# Patient Record
Sex: Female | Born: 1945 | Race: White | Hispanic: No | Smoking: Former smoker
Health system: Southern US, Community
[De-identification: ages and names within clinical notes are randomized; demographics above are authoritative.]

## PROBLEM LIST (undated history)

## (undated) DIAGNOSIS — F419 Anxiety disorder, unspecified: Secondary | ICD-10-CM

## (undated) DIAGNOSIS — E785 Hyperlipidemia, unspecified: Secondary | ICD-10-CM

## (undated) DIAGNOSIS — N390 Urinary tract infection, site not specified: Secondary | ICD-10-CM

## (undated) DIAGNOSIS — I472 Ventricular tachycardia, unspecified: Secondary | ICD-10-CM

## (undated) DIAGNOSIS — I428 Other cardiomyopathies: Secondary | ICD-10-CM

## (undated) DIAGNOSIS — I5022 Chronic systolic (congestive) heart failure: Secondary | ICD-10-CM

## (undated) DIAGNOSIS — E039 Hypothyroidism, unspecified: Secondary | ICD-10-CM

## (undated) DIAGNOSIS — I340 Nonrheumatic mitral (valve) insufficiency: Secondary | ICD-10-CM

## (undated) DIAGNOSIS — I480 Paroxysmal atrial fibrillation: Secondary | ICD-10-CM

## (undated) DIAGNOSIS — I447 Left bundle-branch block, unspecified: Secondary | ICD-10-CM

## (undated) DIAGNOSIS — R7303 Prediabetes: Secondary | ICD-10-CM

## (undated) HISTORY — DX: Left bundle-branch block, unspecified: I44.7

## (undated) HISTORY — DX: Anxiety disorder, unspecified: F41.9

## (undated) HISTORY — DX: Chronic systolic (congestive) heart failure: I50.22

## (undated) HISTORY — DX: Paroxysmal atrial fibrillation: I48.0

## (undated) HISTORY — DX: Nonrheumatic mitral (valve) insufficiency: I34.0

## (undated) HISTORY — PX: CHOLECYSTECTOMY: SHX55

## (undated) HISTORY — PX: KNEE ARTHROSCOPY: SUR90

## (undated) HISTORY — PX: OTHER SURGICAL HISTORY: SHX169

## (undated) HISTORY — DX: Ventricular tachycardia, unspecified: I47.20

## (undated) HISTORY — PX: TOTAL ABDOMINAL HYSTERECTOMY: SHX209

## (undated) HISTORY — DX: Hypothyroidism, unspecified: E03.9

## (undated) HISTORY — DX: Other cardiomyopathies: I42.8

## (undated) HISTORY — PX: APPENDECTOMY: SHX54

## (undated) HISTORY — DX: Hyperlipidemia, unspecified: E78.5

## (undated) HISTORY — DX: Prediabetes: R73.03

## (undated) HISTORY — DX: Ventricular tachycardia: I47.2

## (undated) HISTORY — PX: PACEMAKER IMPLANT: EP1218

## (undated) HISTORY — DX: Urinary tract infection, site not specified: N39.0

---

## 2003-09-20 ENCOUNTER — Ambulatory Visit (HOSPITAL_COMMUNITY): Admission: RE | Admit: 2003-09-20 | Discharge: 2003-09-20 | Payer: Self-pay | Admitting: Internal Medicine

## 2006-02-19 ENCOUNTER — Ambulatory Visit (HOSPITAL_COMMUNITY): Admission: RE | Admit: 2006-02-19 | Discharge: 2006-02-19 | Payer: Self-pay | Admitting: Family Medicine

## 2006-08-27 ENCOUNTER — Ambulatory Visit (HOSPITAL_COMMUNITY): Admission: RE | Admit: 2006-08-27 | Discharge: 2006-08-27 | Payer: Self-pay | Admitting: Family Medicine

## 2007-04-09 ENCOUNTER — Ambulatory Visit: Payer: Self-pay | Admitting: Cardiology

## 2007-04-10 ENCOUNTER — Ambulatory Visit: Payer: Self-pay | Admitting: Cardiology

## 2007-04-10 ENCOUNTER — Inpatient Hospital Stay (HOSPITAL_COMMUNITY): Admission: AD | Admit: 2007-04-10 | Discharge: 2007-04-19 | Payer: Self-pay | Admitting: Cardiology

## 2007-04-29 ENCOUNTER — Ambulatory Visit: Payer: Self-pay | Admitting: Cardiology

## 2007-06-22 ENCOUNTER — Ambulatory Visit: Payer: Self-pay | Admitting: Cardiology

## 2007-06-22 ENCOUNTER — Encounter: Payer: Self-pay | Admitting: Physician Assistant

## 2007-06-26 ENCOUNTER — Ambulatory Visit: Payer: Self-pay | Admitting: Cardiology

## 2007-06-26 ENCOUNTER — Encounter: Payer: Self-pay | Admitting: Physician Assistant

## 2007-08-11 ENCOUNTER — Ambulatory Visit: Payer: Self-pay | Admitting: Cardiology

## 2007-09-29 ENCOUNTER — Encounter: Payer: Self-pay | Admitting: Physician Assistant

## 2007-09-29 ENCOUNTER — Ambulatory Visit: Payer: Self-pay | Admitting: Cardiology

## 2007-10-06 ENCOUNTER — Encounter: Payer: Self-pay | Admitting: Physician Assistant

## 2007-10-26 ENCOUNTER — Ambulatory Visit: Payer: Self-pay | Admitting: Internal Medicine

## 2007-11-02 ENCOUNTER — Ambulatory Visit: Payer: Self-pay | Admitting: Cardiology

## 2007-12-15 ENCOUNTER — Encounter: Payer: Self-pay | Admitting: Cardiology

## 2008-03-21 ENCOUNTER — Encounter: Payer: Self-pay | Admitting: Cardiology

## 2008-05-03 ENCOUNTER — Encounter: Payer: Self-pay | Admitting: Cardiology

## 2008-05-10 ENCOUNTER — Ambulatory Visit: Payer: Self-pay | Admitting: Cardiology

## 2008-08-16 ENCOUNTER — Encounter: Payer: Self-pay | Admitting: Physician Assistant

## 2008-11-07 DIAGNOSIS — I509 Heart failure, unspecified: Secondary | ICD-10-CM | POA: Insufficient documentation

## 2008-11-07 DIAGNOSIS — I429 Cardiomyopathy, unspecified: Secondary | ICD-10-CM | POA: Insufficient documentation

## 2008-11-22 ENCOUNTER — Encounter: Payer: Self-pay | Admitting: Physician Assistant

## 2008-11-29 ENCOUNTER — Ambulatory Visit: Payer: Self-pay | Admitting: Cardiology

## 2008-11-29 DIAGNOSIS — I2510 Atherosclerotic heart disease of native coronary artery without angina pectoris: Secondary | ICD-10-CM | POA: Insufficient documentation

## 2008-11-29 DIAGNOSIS — E039 Hypothyroidism, unspecified: Secondary | ICD-10-CM | POA: Insufficient documentation

## 2008-11-29 DIAGNOSIS — I428 Other cardiomyopathies: Secondary | ICD-10-CM | POA: Insufficient documentation

## 2008-11-29 DIAGNOSIS — I447 Left bundle-branch block, unspecified: Secondary | ICD-10-CM | POA: Insufficient documentation

## 2009-01-02 ENCOUNTER — Encounter: Payer: Self-pay | Admitting: Cardiology

## 2009-03-02 ENCOUNTER — Ambulatory Visit: Payer: Self-pay | Admitting: Cardiology

## 2009-06-28 ENCOUNTER — Telehealth (INDEPENDENT_AMBULATORY_CARE_PROVIDER_SITE_OTHER): Payer: Self-pay | Admitting: *Deleted

## 2009-06-28 ENCOUNTER — Encounter: Payer: Self-pay | Admitting: Physician Assistant

## 2009-06-29 ENCOUNTER — Encounter: Payer: Self-pay | Admitting: Physician Assistant

## 2009-06-29 ENCOUNTER — Ambulatory Visit: Payer: Self-pay | Admitting: Cardiology

## 2009-07-13 ENCOUNTER — Encounter: Payer: Self-pay | Admitting: Cardiology

## 2009-07-14 ENCOUNTER — Ambulatory Visit: Payer: Self-pay | Admitting: Physician Assistant

## 2009-07-14 DIAGNOSIS — I080 Rheumatic disorders of both mitral and aortic valves: Secondary | ICD-10-CM | POA: Insufficient documentation

## 2009-07-14 DIAGNOSIS — I5022 Chronic systolic (congestive) heart failure: Secondary | ICD-10-CM | POA: Insufficient documentation

## 2009-07-17 ENCOUNTER — Encounter: Payer: Self-pay | Admitting: Physician Assistant

## 2009-07-19 ENCOUNTER — Telehealth (INDEPENDENT_AMBULATORY_CARE_PROVIDER_SITE_OTHER): Payer: Self-pay | Admitting: *Deleted

## 2009-09-04 ENCOUNTER — Ambulatory Visit: Payer: Self-pay | Admitting: Internal Medicine

## 2009-09-07 ENCOUNTER — Telehealth (INDEPENDENT_AMBULATORY_CARE_PROVIDER_SITE_OTHER): Payer: Self-pay | Admitting: *Deleted

## 2009-09-07 ENCOUNTER — Encounter: Payer: Self-pay | Admitting: Internal Medicine

## 2009-09-07 DIAGNOSIS — R0602 Shortness of breath: Secondary | ICD-10-CM | POA: Insufficient documentation

## 2009-09-15 ENCOUNTER — Encounter: Payer: Self-pay | Admitting: Internal Medicine

## 2009-09-21 HISTORY — PX: CARDIAC DEFIBRILLATOR PLACEMENT: SHX171

## 2009-09-26 ENCOUNTER — Encounter: Payer: Self-pay | Admitting: Internal Medicine

## 2009-10-02 ENCOUNTER — Encounter: Payer: Self-pay | Admitting: Internal Medicine

## 2009-10-02 ENCOUNTER — Inpatient Hospital Stay (HOSPITAL_COMMUNITY): Admission: RE | Admit: 2009-10-02 | Discharge: 2009-10-03 | Payer: Self-pay | Admitting: Internal Medicine

## 2009-10-03 ENCOUNTER — Ambulatory Visit: Payer: Self-pay | Admitting: Internal Medicine

## 2009-10-05 ENCOUNTER — Encounter: Payer: Self-pay | Admitting: Internal Medicine

## 2009-10-13 ENCOUNTER — Ambulatory Visit: Payer: Self-pay | Admitting: Internal Medicine

## 2009-10-23 ENCOUNTER — Telehealth (INDEPENDENT_AMBULATORY_CARE_PROVIDER_SITE_OTHER): Payer: Self-pay | Admitting: *Deleted

## 2009-11-27 ENCOUNTER — Ambulatory Visit: Payer: Self-pay | Admitting: Cardiology

## 2010-01-10 ENCOUNTER — Ambulatory Visit: Payer: Self-pay | Admitting: Internal Medicine

## 2010-02-11 ENCOUNTER — Encounter (INDEPENDENT_AMBULATORY_CARE_PROVIDER_SITE_OTHER): Payer: Self-pay | Admitting: Internal Medicine

## 2010-02-11 ENCOUNTER — Encounter: Payer: Self-pay | Admitting: Family Medicine

## 2010-02-21 NOTE — Assessment & Plan Note (Signed)
Summary: 3 MO FU  Medications Added CENTRUM ULTRA WOMENS  TABS (MULTIPLE VITAMINS-MINERALS) Take 1 tablet by mouth once a day MIRALAX  POWD (POLYETHYLENE GLYCOL 3350) use as directed daily SPIRONOLACTONE 25 MG TABS (SPIRONOLACTONE) Take 1 tablet by mouth once a day      Allergies Added:   Visit Type:  Follow-up Referring Provider:  Dr Andee Lineman Primary Provider:  Margo Common   History of Present Illness: the patient is a 65 year old female with a history of nonischemic myopathy status post cardiac catheterization in 2009 with no CAD. She underwent a recent placement of a biventricular ICD, NYHA class III  with left bundle branch block. She already had a wound check done which was within normal limits. The patient also has hyperthyroidism and was on  amiodarone but is currently not on an antiarrhythmic . On the last echocardiogram in June of 2011 showed mild mitral regurgitation with ejection fraction 25-30%.  The patient states that she does not have chest tightness anymore since her defibrillator implant but still feels very short of breath. She remains NYHA class III. She has not noticed any improvement in her exercise tolerance. She tells me that there might be a problem her LV lead but I could not find any documentation of this. In any event the patient will follow up with Dr. Johney Frame in further discussions can be had at that time.  The patient stated she feels flushed and is wondering she needs a flu shot. She's also been coughing a bit. She denies however any chest pain orthopnea PND. She is otherwise stable from a cardiovascular perspective.  Preventive Screening-Counseling & Management  Alcohol-Tobacco     Smoking Status: quit     Year Quit: 1998  Current Medications (verified): 1)  Carvedilol 6.25 Mg Tabs (Carvedilol) .... Take One Tablet By Mouth Twice A Day 2)  Aspirin 81 Mg Tbec (Aspirin) .... Take 1 Tablet By Mouth Once A Day 3)  Furosemide 80 Mg Tabs (Furosemide) .... Take 1  Tablet By Mouth Once A Day 4)  Levothyroxine Sodium 75 Mcg Tabs (Levothyroxine Sodium) .... Take 1 Tablet By Mouth Once A Day 5)  Digoxin 0.125 Mg Tabs (Digoxin) .... Take 1 Tablet By Mouth Once A Day 6)  Losartan Potassium 25 Mg Tabs (Losartan Potassium) .... Take 1 Tablet By Mouth Once A Day 7)  Diazepam 2 Mg Tabs (Diazepam) .Marland Kitchen.. 1 Three Times A Day 8)  Centrum Ultra Womens  Tabs (Multiple Vitamins-Minerals) .... Take 1 Tablet By Mouth Once A Day 9)  Miralax  Powd (Polyethylene Glycol 3350) .... Use As Directed Daily 10)  Spironolactone 25 Mg Tabs (Spironolactone) .... Take 1 Tablet By Mouth Once A Day  Allergies (verified): 1)  ! Pcn 2)  ! Keflex 3)  ! Levaquin 4)  ! Clindamycin 5)  ! * Xray Dye 6)  ! * Latex  Comments:  Nurse/Medical Assistant: The patient's medication list and allergies were reviewed with the patient and were updated in the Medication and Allergy Lists.  Past History:  Past Surgical History: Last updated: 09/04/2009 s/p cholecystectomy TAH and R ovary removed s/p APPY L knee arthroscopy  Family History: Last updated: 09/04/2009 Negative FH of Diabetes, Hypertension, or Coronary Artery Disease Her mother had a "weak heart"  Social History: Last updated: 09/04/2009 Married and lives in [place]. Not employed. Tobacco Use - quit 11 years ago ETOH- none Drugs- none  Risk Factors: Smoking Status: quit (11/27/2009)  Past Medical History: Nonischemic CM (EF25-30%) echocardiogram June 2011  NYHA Class III CHF LBBB Hypothyroidism recurrent UTI glucose interolance HL Status post CRT-D implantation September 2011 NYHA class III Mild to moderate mitral regurgitation  Review of Systems       The patient complains of shortness of breath and prolonged cough.  The patient denies fatigue, malaise, fever, weight gain/loss, vision loss, decreased hearing, hoarseness, chest pain, palpitations, wheezing, sleep apnea, coughing up blood, abdominal pain, blood  in stool, nausea, vomiting, diarrhea, heartburn, incontinence, blood in urine, muscle weakness, joint pain, leg swelling, rash, skin lesions, headache, fainting, dizziness, depression, anxiety, enlarged lymph nodes, easy bruising or bleeding, and environmental allergies.    Vital Signs:  Patient profile:   65 year old female Height:      64 inches Weight:      211 pounds Pulse rate:   63 / minute BP sitting:   103 / 63  (left arm) Cuff size:   large  Vitals Entered By: Carlye Grippe (November 27, 2009 9:33 AM)  Physical Exam  Additional Exam:  General: Well-developed, well-nourished in no distress head: Normocephalic and atraumatic eyes PERRLA/EOMI intact, conjunctiva and lids normal nose: No deformity or lesions mouth normal dentition, normal posterior pharynx neck: Supple, no JVD.  No masses, thyromegaly or abnormal cervical nodes lungs: Normal breath sounds bilaterally without wheezing.  Normal percussion heart: regular rate and rhythm with normal S1 and S2, no S3 or S4.  PMI is normal.  No pathological murmurs abdomen: Normal bowel sounds, abdomen is soft and nontender without masses, organomegaly or hernias noted.  No hepatosplenomegaly musculoskeletal: Back normal, normal gait muscle strength and tone normal pulsus: Pulse is normal in all 4 extremities Extremities: No peripheral pitting edema neurologic: Alert and oriented x 3 skin: Intact without lesions or rashes cervical nodes: No significant adenopathy psychologic: Normal affect    EKG  Procedure date:  11/27/2009  Findings:      normal sinus rhythm with biventricular pacing. Relatively narrow QRS consistent with CRT-D pacing. Heart rate 62 beats per minute   ICD Specifications Following MD:  Hillis Range, MD     ICD Vendor:  [employer]     ICD Model Number:  [id number]     ICD Serial Number:  [id number] ICD DOI:  10/02/2009     ICD Implanting MD:  Hillis Range, MD  Lead 1:    Location: RA     DOI: 10/02/2009     Model  #: [id number]     Serial #: [id number]     Status: active Lead 2:    Location: RV     DOI: 10/02/2009     Model #: [id number]     Serial #: [id number]     Status: active Lead 3:    Location: LV     DOI: 10/02/2009     Model #: [id number]     Serial #: [id number]     Status: active  Indications::  CM   Brady Parameters Mode DDDR     Lower Rate Limit:  60     Upper Rate Limit 120 PAV 180     Sensed AV Delay:  160  Tachy Zones VF:  222     VT:  176     Impression & Recommendations:  Problem # 1:  SHORTNESS OF BREATH (ICD-786.05) no significant improvement since her CFRT implanr=tt. Further follow up with Dr. Johney Frame. The patient does not have any evidence of volume overload or congestive heart failure. Continue medical therapy and will up titrate carvedilol  in the future as blood pressure allows because her blood pressure relatively low. Her updated medication list for this problem includes:    Carvedilol 6.25 Mg Tabs (Carvedilol) .Marland Kitchen... Take one tablet by mouth twice a day    Aspirin 81 Mg Tbec (Aspirin) .Marland Kitchen... Take 1 tablet by mouth once a day    Furosemide 80 Mg Tabs (Furosemide) .Marland Kitchen... Take 1 tablet by mouth once a day    Digoxin 0.125 Mg Tabs (Digoxin) .Marland Kitchen... Take 1 tablet by mouth once a day    Losartan Potassium 25 Mg Tabs (Losartan potassium) .Marland Kitchen... Take 1 tablet by mouth once a day    Spironolactone 25 Mg Tabs (Spironolactone) .Marland Kitchen... Take 1 tablet by mouth once a day  Problem # 2:  MITRAL REGURGITATION (ICD-396.3) initiallythe  patient moderate mitral regurgitation but  the last echo showed mild MR. She will need a followup echocardiogram in 6 months we'll assess her ejection fraction as well as to assess her mitral regurgitation  Problem # 3:  CHRONIC SYSTOLIC HEART FAILURE (ICD-428.22) ejection fraction 25-30%. No volume overload Her updated medication list for this problem includes:    Carvedilol 6.25 Mg Tabs (Carvedilol) .Marland Kitchen... Take one tablet by mouth twice a day    Aspirin 81 Mg Tbec (Aspirin) .Marland Kitchen...  Take 1 tablet by mouth once a day    Furosemide 80 Mg Tabs (Furosemide) .Marland Kitchen... Take 1 tablet by mouth once a day    Digoxin 0.125 Mg Tabs (Digoxin) .Marland Kitchen... Take 1 tablet by mouth once a day    Losartan Potassium 25 Mg Tabs (Losartan potassium) .Marland Kitchen... Take 1 tablet by mouth once a day    Spironolactone 25 Mg Tabs (Spironolactone) .Marland Kitchen... Take 1 tablet by mouth once a day  Problem # 4:  HYPOTHYROIDISM (ICD-244.9) Synthroid has been adjusted by primary care physician Her updated medication list for this problem includes:    Levothyroxine Sodium 75 Mcg Tabs (Levothyroxine sodium) .Marland Kitchen... Take 1 tablet by mouth once a day  Problem # 5:  LBBB (ICD-426.3) Will obtain electrocardiogram. Her updated medication list for this problem includes:    Carvedilol 6.25 Mg Tabs (Carvedilol) .Marland Kitchen... Take one tablet by mouth twice a day    Aspirin 81 Mg Tbec (Aspirin) .Marland Kitchen... Take 1 tablet by mouth once a day  Patient Instructions: 1)  Your physician recommends that you continue on your current medications as directed. Please refer to the Current Medication list given to you today. 2)  Follow up in  6 months

## 2010-02-21 NOTE — Miscellaneous (Signed)
Summary: PMH   Past Medical History:    CONGESTIVE HEART FAILURE, UNSPECIFIED (ICD-428.0)    CARDIOMYOPATHY, SECONDARY (ICD-425.9)    Hypothyroidism    ICD  declined by the patient    1. Nonischemic cardiomyopathy, severe LV dysfunction, ejection        fraction 15-20%, maximum medical therapy.    2. Congestive heart failure class II B/III.    3. Bundle-branch block.    4. Rule out hypothyroidism on amiodarone drug therapy.    5. The patient declined ICD implant. Clinical Lists Changes  Problems: Added new problem of CARDIOMYOPATHY, DILATED (ICD-425.4) Added new problem of LEFT VENTRICULAR FUNCTION, DECREASED (ICD-429.2) Added new problem of LBBB (ICD-426.3) Added new problem of HYPOTHYROIDISM (ICD-244.9) Observations: Added new observation of PRIMARY MD: Tapper (11/29/2008 7:32) Added new observation of PAST MED HX: CONGESTIVE HEART FAILURE, UNSPECIFIED (ICD-428.0) CARDIOMYOPATHY, SECONDARY (ICD-425.9) Hypothyroidism ICD  declined by the patient 1. Nonischemic cardiomyopathy, severe LV dysfunction, ejection     fraction 15-20%, maximum medical therapy. 2. Congestive heart failure class II B/III. 3. Bundle-branch block. 4. Rule out hypothyroidism on amiodarone drug therapy. 5. The patient declined ICD implant. (11/29/2008 7:32) Added new observation of CXR RESULTS: question early right lower lung infiltrate pneumonia Stable cardiomegaly hyperinflation (05/04/2008 7:37)     Past History:  Past Medical History: CONGESTIVE HEART FAILURE, UNSPECIFIED (ICD-428.0) CARDIOMYOPATHY, SECONDARY (ICD-425.9) Hypothyroidism ICD  declined by the patient 1. Nonischemic cardiomyopathy, severe LV dysfunction, ejection     fraction 15-20%, maximum medical therapy. 2. Congestive heart failure class II B/III. 3. Bundle-branch block. 4. Rule out hypothyroidism on amiodarone drug therapy. 5. The patient declined ICD implant.   Primary Provider:  Margo Common   History of Present Illness:  The patient is a pleasant 65 year old female with a longstanding history of nonischemic cardiomyopathy.  Her EF has varied in the past between 10 and 25%.  Currently approximately 15-20%.  Despite maximal medical therapy, she is congestive heart failure class II B/III.  She has left bundle-branch block and did meet criteria for CRTD; however, the patient discussed with Dr. Ladona Ridgel and due to financial reasons, she has declined consideration to proceed with this.  The patient today presents for followup.  She has been doing well.  She denies any chest pain.  She does report a lack of energy and occasional crying spells.  We monitored her thyroid function test on amiodarone and her TSH had gone up to 11.91, but her free T4 was 0.93. I told the patient that this could be contributing to her mood disorder, but at this point in time, I would not start treating her with Synthroid yet.  We will follow in 6 weeks again her free T4 and TSH and if higher, certainly a small dose of Synthroid can be initiated.    CXR  Procedure date:  05/04/2008  Findings:      question early right lower lung infiltrate pneumonia Stable cardiomegaly hyperinflation

## 2010-02-21 NOTE — Assessment & Plan Note (Signed)
Summary: 3 MO FU PER FEB REMINDER-SRS  Medications Added FUROSEMIDE 80 MG TABS (FUROSEMIDE) Take 1 tablet by mouth once a day LEVOTHYROXINE SODIUM 75 MCG TABS (LEVOTHYROXINE SODIUM) Take 1 tablet by mouth once a day      Allergies Added: ! * LATEX  Visit Type:  Follow-up Primary Provider:  Margo Common  CC:  follow-up visit.  History of Present Illness: the patient is a 65 year old female with a history of dilated cardiomyopathy. The patient ejection fraction is 20-25%. The patient is an NYHA class IIb/IIIa. She has declined on multiple occasions in the past to consider ICD implant.she states that she does not have the financial means to consider an implant.  She does report shortness of breath on exertion. She also noticed increasing lower extremity edema but has not taken her Lasix over the last several days because of a recent kidney infection. As the patient to make sure that she restart status. She denies any palpitations or syncope. She denies any shortness of breath at rest.  Clinical Review Panels:  CXR CXR results question early right lower lung infiltrate pneumonia Stable cardiomegaly hyperinflation (05/04/2008)  Echocardiogram Echocardiogram Conclusions:         1. The estimated ejection fraction is 20-25%.          2. There are multiple regional wall motion abnormalities.          3. The left atrium is mildly dilated.         4. The right ventricular global systolic function is mildly reduced.         5. The right atrium is mildly dilated.          6. There is mild mitral regurgitation.          7. There is mild tricuspid regurgitation.         8. There is mild pulmonic regurgitation.          9. The inferior vena cava appears abnormal.         10. There is less than 50% respiratory change in the inferior vena         cava dimension. (06/26/2007)  Cardiac Imaging Cardiac Cath Findings ASSESSMENT:   1. Severe nonischemic cardiomyopathy with normal filling pressures and         normal outputs.  There is no evidence of coronary artery disease.   2. Frequent PVCs and trigeminy and quadrigeminy pattern.   3. Relative intolerance of heart failure meds due to orthostatic       hypotension.      PLAN/DISCUSSION:  I discussed this case with Dr. Andee Lineman.  We will   continue to try titrating her heart failure regimen.  Will ask   Electrophysiology to see for possible by the biventricular ICD.      [name], MD  (04/13/2007)    Preventive Screening-Counseling & Management  Alcohol-Tobacco     Smoking Status: quit     Year Quit: 1998  Current Medications (verified): 1)  Carvedilol 3.125 Mg Tabs (Carvedilol) .... Take 1 Tablet By Mouth Two Times A Day 2)  Aspirin 81 Mg Tbec (Aspirin) .... Take 1 Tablet By Mouth Once A Day 3)  Diovan 40 Mg Tabs (Valsartan) .... Take 1 Tablet By Mouth Once A Day 4)  Furosemide 80 Mg Tabs (Furosemide) .... Take 1 Tablet By Mouth Once A Day 5)  Levothyroxine Sodium 75 Mcg Tabs (Levothyroxine Sodium) .... Take 1 Tablet By Mouth Once A Day 6)  Alprazolam 0.5 Mg Tabs (Alprazolam) .... As Needed 7)  Digoxin 0.125 Mg Tabs (Digoxin) .... Take 1 Tablet By Mouth Once A Day 8)  Spironolactone 25 Mg Tabs (Spironolactone) .... Take 1 Tablet By Mouth Once A Day  Allergies (verified): 1)  ! Pcn 2)  ! Keflex 3)  ! Levaquin 4)  ! Clindamycin 5)  ! * Xray Dye 6)  ! * Latex  Comments:  Nurse/Medical Assistant: The patient's medications and allergies were reviewed with the patient and were updated in the Medication and Allergy Lists. List reviewed.  Past History:  Past Medical History: Last updated: 11/29/2008 CONGESTIVE HEART FAILURE, UNSPECIFIED (ICD-428.0) CARDIOMYOPATHY, SECONDARY (ICD-425.9) Hypothyroidism ICD  declined by the patient 1. Nonischemic cardiomyopathy, severe LV dysfunction, ejection     fraction 15-20%, maximum medical therapy. 2. Congestive heart failure class II B/III. 3. Bundle-branch block. 4.  Rule out hypothyroidism on amiodarone drug therapy. 5. The patient declined ICD implant.  Family History: Last updated: 11/29/2008 Negative FH of Diabetes, Hypertension, or Coronary Artery Disease  Social History: Last updated: 11/07/2008 Married  Tobacco Use - Former.   Risk Factors: Smoking Status: quit (03/02/2009)  Review of Systems       The patient complains of shortness of breath.  The patient denies fatigue, malaise, fever, weight gain/loss, vision loss, decreased hearing, hoarseness, chest pain, palpitations, prolonged cough, wheezing, sleep apnea, coughing up blood, abdominal pain, blood in stool, nausea, vomiting, diarrhea, heartburn, incontinence, blood in urine, muscle weakness, joint pain, leg swelling, rash, skin lesions, headache, fainting, dizziness, depression, anxiety, enlarged lymph nodes, easy bruising or bleeding, and environmental allergies.    Vital Signs:  Patient profile:   65 year old female Height:      64 inches Weight:      226 pounds Pulse rate:   51 / minute BP sitting:   109 / 71  (left arm) Cuff size:   large  Vitals Entered By: Carlye Grippe (March 02, 2009 2:36 PM) CC: follow-up visit   Physical Exam  Additional Exam:  General: Well-developed, well-nourished in no distress head: Normocephalic and atraumatic eyes PERRLA/EOMI intact, conjunctiva and lids normal nose: No deformity or lesions mouth normal dentition, normal posterior pharynx neck: Supple, no JVD.  No masses, thyromegaly or abnormal cervical nodes lungs: Normal breath sounds bilaterally without wheezing.  Normal percussion heart: regular rate and rhythm with normal S1 and S2, no S3 or S4.  PMI is normal.  No pathological murmurs abdomen: Normal bowel sounds, abdomen is soft and nontender without masses, organomegaly or hernias noted.  No hepatosplenomegaly musculoskeletal: Back normal, normal gait muscle strength and tone normal pulsus: Pulse is normal in all 4  extremities Extremities: No peripheral pitting edema neurologic: Alert and oriented x 3 skin: Intact without lesions or rashes cervical nodes: No significant adenopathy psychologic: Normal affect    Impression & Recommendations:  Problem # 1:  LEFT VENTRICULAR FUNCTION, DECREASED (ICD-429.2) the patient's ejection fraction is 20-25%. An ICD would be indicated but the patient declines. She is however good heart failure medical regimen and renal functions remained stable. Unfortunately has been noncompliant with her Lasix and have asked her to restart this  Problem # 2:  CARDIOMYOPATHY, DILATED (ICD-425.4) Assessment: Comment Only  Her updated medication list for this problem includes:    Carvedilol 3.125 Mg Tabs (Carvedilol) .Marland Kitchen... Take 1 tablet by mouth two times a day    Aspirin 81 Mg Tbec (Aspirin) .Marland Kitchen... Take 1 tablet by mouth once a day  Diovan 40 Mg Tabs (Valsartan) .Marland Kitchen... Take 1 tablet by mouth once a day    Furosemide 80 Mg Tabs (Furosemide) .Marland Kitchen... Take 1 tablet by mouth once a day    Digoxin 0.125 Mg Tabs (Digoxin) .Marland Kitchen... Take 1 tablet by mouth once a day    Spironolactone 25 Mg Tabs (Spironolactone) .Marland Kitchen... Take 1 tablet by mouth once a day  Problem # 3:  LBBB (ICD-426.3) Assessment: Comment Only  Her updated medication list for this problem includes:    Carvedilol 3.125 Mg Tabs (Carvedilol) .Marland Kitchen... Take 1 tablet by mouth two times a day    Aspirin 81 Mg Tbec (Aspirin) .Marland Kitchen... Take 1 tablet by mouth once a day  Problem # 4:  HYPOTHYROIDISM (ICD-244.9) Assessment: Comment Only  Her updated medication list for this problem includes:    Levothyroxine Sodium 75 Mcg Tabs (Levothyroxine sodium) .Marland Kitchen... Take 1 tablet by mouth once a day  Patient Instructions: 1)  Your physician recommends that you continue on your current medications as directed. Please refer to the Current Medication list given to you today. 2)  Follow up in  6 months.

## 2010-02-21 NOTE — Miscellaneous (Signed)
Summary: pre meds prior to implant  Clinical Lists Changes  Medications: Added new medication of PREDNISONE 20 MG TABS (PREDNISONE) take 3 tablets 18 hours prior to procedure - Signed Added new medication of PEPCID 20 MG TABS (FAMOTIDINE) one by mouth 12 hours prior to procedure - Signed Rx of PREDNISONE 20 MG TABS (PREDNISONE) take 3 tablets 18 hours prior to procedure;  #3 x 0;  Signed;  Entered by: Dennis Bast, RN, BSN;  Authorized by: Hillis Range, MD;  Method used: Electronically to [facility] [address], [place], [place], [place]  [address], Ph: [phone], Fax: [phone] Rx of PEPCID 20 MG TABS (FAMOTIDINE) one by mouth 12 hours prior to procedure;  #1 x 0;  Signed;  Entered by: Dennis Bast, RN, BSN;  Authorized by: Hillis Range, MD;  Method used: Electronically to [facility] [address] [place], [place], [place]  [address], Ph: [phone], Fax: [phone]    Prescriptions: PEPCID 20 MG TABS (FAMOTIDINE) one by mouth 12 hours prior to procedure  #1 x 0   Entered by:   Dennis Bast, RN, BSN   Authorized by:   Hillis Range, MD   Signed by:   Dennis Bast, RN, BSN on 09/15/2009   Method used:   Electronically to        [facility] (retail)       [address]       [place], [place]  [address]       Ph: [phone]       Fax: [phone]   RxID:   [id number] PREDNISONE 20 MG TABS (PREDNISONE) take 3 tablets 18 hours prior to procedure  #3 x 0   Entered by:   Dennis Bast, RN, BSN   Authorized by:   Hillis Range, MD   Signed by:   Dennis Bast, RN, BSN on 09/15/2009   Method used:   Electronically to        [facility] (retail)       [address]       [place], [place]  [address]       Ph: [phone]       Fax: [phone]   RxID:   [id number]

## 2010-02-21 NOTE — Assessment & Plan Note (Signed)
Summary: 6 mo fu per oct reminder-srs  Medications Added LEVOTHYROXINE SODIUM 50 MCG TABS (LEVOTHYROXINE SODIUM) Take 1 tablet by mouth once a day ALPRAZOLAM 0.5 MG TABS (ALPRAZOLAM) as needed DIGOXIN 0.125 MG TABS (DIGOXIN) Take 1 tablet by mouth once a day SPIRONOLACTONE 25 MG TABS (SPIRONOLACTONE) Take 1 tablet by mouth once a day      Allergies Added: ! PCN ! KEFLEX ! LEVAQUIN ! CLINDAMYCIN ! * XRAY DYE  Visit Type:  Follow-up Primary Provider:  Margo Common  CC:  follow-up visit.  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 She reports that over the last several months she has increased short of breath and now sleeps on 3 pillows. Her Lasix was recently increased to 80 mg p.o. q. daily and she did lose 5 pounds. However her weight remains 230 which is up 25 pounds since March of 2009.she stated she become short of breath on walking just a very small distance. She denies however any PND. Up titration of medical therapy is limited due to relative hypotension. The patient's also be evaluated for potential right kidney disease with nephrolithiasis. And forcefully I do not have a recent BMET to evaluate her renal function. Of note is that the patient was previously on amiodarone and has developed hypothyroidism. She is now on Synthroid supplement. The patient in the past also has declined further evaluation for placement of a CRTD. Based on her symptoms and electrocardiogram she could potentially benefit from CRTD, or at least CRT-P. however the patient continues to decline any further evaluation due to financial constraints.  Clinical Review Panels:  CXR CXR results question early right lower lung infiltrate pneumonia Stable cardiomegaly hyperinflation (05/04/2008)  Echocardiogram Echocardiogram Conclusions:         1. The estimated ejection fraction is 20-25%.          2. There are  multiple regional wall motion abnormalities.          3. The left atrium is mildly dilated.         4. The right ventricular global systolic function is mildly reduced.         5. The right atrium is mildly dilated.          6. There is mild mitral regurgitation.          7. There is mild tricuspid regurgitation.         8. There is mild pulmonic regurgitation.          9. The inferior vena cava appears abnormal.         10. There is less than 50% respiratory change in the inferior vena         cava dimension. (06/26/2007)  Cardiac Imaging Cardiac Cath Findings ASSESSMENT:   1. Severe nonischemic cardiomyopathy with normal filling pressures and       normal outputs.  There is no evidence of coronary artery disease.   2. Frequent PVCs and trigeminy and quadrigeminy pattern.   3. Relative intolerance of heart failure meds due to orthostatic       hypotension.      PLAN/DISCUSSION:  I discussed this case with Dr. Andee Lineman.  We will   continue to try titrating her heart failure regimen.  Will ask   Electrophysiology to see for possible by the biventricular ICD.      [name], MD  (04/13/2007)    Preventive  Screening-Counseling & Management  Alcohol-Tobacco     Smoking Status: quit     Year Quit: 1998  Current Medications (verified): 1)  Carvedilol 3.125 Mg Tabs (Carvedilol) .... Take 1 Tablet By Mouth Two Times A Day 2)  Aspirin 81 Mg Tbec (Aspirin) .... Take 1 Tablet By Mouth Once A Day 3)  Diovan 40 Mg Tabs (Valsartan) .... Take 1 Tablet By Mouth Once A Day 4)  Potassium Chloride Cr 10 Meq Cr-Tabs (Potassium Chloride) .... Take 1 Tablet By Mouth Once A Day 5)  Furosemide 20 Mg Tabs (Furosemide) .... Take 1 Tablet By Mouth Once A Day 6)  Levothyroxine Sodium 50 Mcg Tabs (Levothyroxine Sodium) .... Take 1 Tablet By Mouth Once A Day 7)  Alprazolam 0.5 Mg Tabs (Alprazolam) .... As Needed 8)  Digoxin 0.125 Mg Tabs (Digoxin) .... Take 1 Tablet By Mouth Once A Day 9)   Spironolactone 25 Mg Tabs (Spironolactone) .... Take 1 Tablet By Mouth Once A Day  Allergies (verified): 1)  ! Pcn 2)  ! Keflex 3)  ! Levaquin 4)  ! Clindamycin 5)  ! * Xray Dye  Past History:  Past Medical History: Last updated: 11/29/2008 CONGESTIVE HEART FAILURE, UNSPECIFIED (ICD-428.0) CARDIOMYOPATHY, SECONDARY (ICD-425.9) Hypothyroidism ICD  declined by the patient 1. Nonischemic cardiomyopathy, severe LV dysfunction, ejection     fraction 15-20%, maximum medical therapy. 2. Congestive heart failure class II B/III. 3. Bundle-branch block. 4. Rule out hypothyroidism on amiodarone drug therapy. 5. The patient declined ICD implant.  Family History: Negative FH of Diabetes, Hypertension, or Coronary Artery Disease  Social History: Reviewed history from 11/07/2008 and no changes required. Married  Tobacco Use - Former.   Review of Systems       The patient complains of fatigue, weight gain/loss, shortness of breath, and depression.  The patient denies malaise, fever, vision loss, decreased hearing, hoarseness, chest pain, palpitations, prolonged cough, wheezing, sleep apnea, coughing up blood, abdominal pain, blood in stool, nausea, vomiting, diarrhea, heartburn, incontinence, blood in urine, muscle weakness, joint pain, leg swelling, rash, skin lesions, headache, fainting, dizziness, anxiety, enlarged lymph nodes, easy bruising or bleeding, and environmental allergies.    Vital Signs:  Patient profile:   65 year old female Height:      64 inches Weight:      230 pounds BMI:     39.62 Pulse rate:   46 / minute BP sitting:   89 / 59  (left arm) Cuff size:   large  Vitals Entered By: Carlye Grippe (November 29, 2008 9:42 AM)  Nutrition Counseling: Patient's BMI is greater than 25 and therefore counseled on weight management options. CC: follow-up visit   Physical Exam  General:  Well developed, well nourished, in no acute distress. Head:  normocephalic and  atraumatic Eyes:  PERRLA/EOM intact; conjunctiva and lids normal. Mouth:  Teeth, gums and palate normal. Oral mucosa normal. Neck:  Neck supple, no JVD. No masses, thyromegaly or abnormal cervical nodes. Lungs:  Clear bilaterally to auscultation and percussion. Heart:  Non-displaced PMI, chest non-tender; regular rate and rhythm, S1, S2 without murmurs, rubs or gallops. Carotid upstroke normal, no bruit. Normal abdominal aortic size, no bruits. Femorals normal pulses, no bruits. Pedals normal pulses. No edema, no varicosities. Abdomen:  Bowel sounds positive; abdomen soft and non-tender without masses, organomegaly, or hernias noted. No hepatosplenomegaly. Msk:  Back normal, normal gait. Muscle strength and tone normal. Pulses:  pulses normal in all 4 extremities Extremities:  No clubbing or cyanosis. Neurologic:  Alert and oriented x 3. Skin:  Intact without lesions or rashes. Cervical Nodes:  no significant adenopathy Psych:  Normal affect.   EKG  Procedure date:  11/29/2008  Findings:      normal sinus rhythm with left bundle branch block.  Impression & Recommendations:  Problem # 1:  CARDIOMYOPATHY, SECONDARY (ICD-425.9) the patient has a dilated cardiac myopathy and appears to be volume overload. She is currently in NYHA class III. She continues to decline however any further device therapy. We will check a BMET and BNP level. Based on her prior weight the patient could benefit from the addition of digoxin 0.125 milligramss p.o. q. daily and spironolactone 25 mg p.o. q. daily. However the initiation of therapy will depend on her baseline renal function. I also explained to the patient to discontinue any potassium supplements. We will also recheck another BMET in one week after initiation of spironolactone to make sure the patient does not become hyperkalemic. The following medications were removed from the medication list:    Pacerone 100 Mg Tabs (Amiodarone hcl) .Marland Kitchen... Take 1 tablet  by mouth once a day Her updated medication list for this problem includes:    Carvedilol 3.125 Mg Tabs (Carvedilol) .Marland Kitchen... Take 1 tablet by mouth two times a day    Aspirin 81 Mg Tbec (Aspirin) .Marland Kitchen... Take 1 tablet by mouth once a day    Diovan 40 Mg Tabs (Valsartan) .Marland Kitchen... Take 1 tablet by mouth once a day    Furosemide 20 Mg Tabs (Furosemide) .Marland Kitchen... Take 1 tablet by mouth once a day    Digoxin 0.125 Mg Tabs (Digoxin) .Marland Kitchen... Take 1 tablet by mouth once a day    Spironolactone 25 Mg Tabs (Spironolactone) .Marland Kitchen... Take 1 tablet by mouth once a day  Problem # 2:  LBBB (ICD-426.3) Assessment: Unchanged  The following medications were removed from the medication list:    Pacerone 100 Mg Tabs (Amiodarone hcl) .Marland Kitchen... Take 1 tablet by mouth once a day Her updated medication list for this problem includes:    Carvedilol 3.125 Mg Tabs (Carvedilol) .Marland Kitchen... Take 1 tablet by mouth two times a day    Aspirin 81 Mg Tbec (Aspirin) .Marland Kitchen... Take 1 tablet by mouth once a day  Orders: T-Basic Metabolic Panel [phone]) T-BNP  (B Natriuretic Peptide) [phone]) T-Basic Metabolic Panel ([id number])  Problem # 3:  HYPOTHYROIDISM (ICD-244.9) the patient is on Synthroid supplement. This is followed by Dr. Margo Common.  Her updated medication list for this problem includes:    Levothyroxine Sodium 50 Mcg Tabs (Levothyroxine sodium) .Marland Kitchen... Take 1 tablet by mouth once a day  Other Orders: EKG w/ Interpretation (93000)  Patient Instructions: 1)  check a BMET and BNP level today. If normal potassium and normal renal function, then start digoxin 0.125 mg daily and spironolactone 25 mg daily. 2)  Make sure the patient does not take potassium supplements. Another BMET one week later. 3)  Follow up in 3 months.   Prescriptions: SPIRONOLACTONE 25 MG TABS (SPIRONOLACTONE) Take 1 tablet by mouth once a day  #30 x 6   Entered by:   Hoover Brunette, LPN   Authorized by:   Lewayne Bunting, MD, [facility]   Signed by:   Hoover Brunette, LPN on  96/29/5284   Method used:   Electronically to        [facility]* (retail)       [address]       [place]  [place], [place]  [address]       Ph: [phone]       Fax: [phone]   RxID:   [id number] DIGOXIN 0.125 MG TABS (DIGOXIN) Take 1 tablet by mouth once a day  #30 x 6   Entered by:   Hoover Brunette, LPN   Authorized by:   Lewayne Bunting, MD, [facility]   Signed by:   Hoover Brunette, LPN on 84/13/2440   Method used:   Electronically to        [facility]* (retail)       [address]       [place], [place]  [address]       Ph: [phone]       Fax: [phone]   RxID:   [id number]

## 2010-02-21 NOTE — Letter (Signed)
Summary: [facility] D/C DR. Wilburt Finlay  [facility] D[facility]C DR. Wilburt Finlay   Imported By: Zachary George 07/14/2009 13:52:43  _____________________________________________________________________  External Attachment:    Type:   Image     Comment:   External Document

## 2010-02-21 NOTE — Miscellaneous (Signed)
Summary: Orders Update  Clinical Lists Changes  Problems: Added new problem of ENCOUNTER FOR LONG-TERM USE OF OTHER MEDICATIONS (ICD-V58.69) Added new problem of SHORTNESS OF BREATH (ICD-786.05) Orders: Added new Test order of T-Basic Metabolic Panel [phone]) - Signed Added new Test order of T-CBC w/Diff [phone]) - Signed Added new Test order of T-Protime, Auto ([id number]) - Signed Added new Test order of T-PTT ([id number]) - Signed

## 2010-02-21 NOTE — Miscellaneous (Signed)
Summary: Device preload  Clinical Lists Changes  Observations: Added new observation of ICD INDICATN: CM (10/05/2009 13:54) Added new observation of ICDLEADSTAT3: active (10/05/2009 13:54) Added new observation of ICDLEADSER3: [id number] (10/05/2009 13:54) Added new observation of ICDLEADMOD3: 1258T (10/05/2009 13:54) Added new observation of ICDLEADLOC3: LV (10/05/2009 13:54) Added new observation of ICDLEADSTAT2: active (10/05/2009 13:54) Added new observation of ICDLEADSER2: [id number] (10/05/2009 13:54) Added new observation of ICDLEADMOD2: 7122  (10/05/2009 13:54) Added new observation of ICDLEADLOC2: RV  (10/05/2009 13:54) Added new observation of ICDLEADSTAT1: active  (10/05/2009 13:54) Added new observation of ICDLEADSER1: [id number]  (10/05/2009 13:54) Added new observation of ICDLEADMOD1: 4696EX  (10/05/2009 13:54) Added new observation of ICDLEADLOC1: RA  (10/05/2009 13:54) Added new observation of ICD IMP MD: Hillis Range, MD  (10/05/2009 13:54) Added new observation of ICDLEADDOI3: 10/02/2009  (10/05/2009 13:54) Added new observation of ICDLEADDOI2: 10/02/2009  (10/05/2009 13:54) Added new observation of ICDLEADDOI1: 10/02/2009  (10/05/2009 13:54) Added new observation of ICD IMPL DTE: 10/02/2009  (10/05/2009 13:54) Added new observation of ICD SERL#: [id number]  (10/05/2009 13:54) Added new observation of ICD MODL#: [id number]  (10/05/2009 27:25) Added new observation of ICDMANUFACTR: [employer]  (10/05/2009 13:54) Added new observation of ICD MD: Hillis Range, MD  (10/05/2009 13:54)       ICD Specifications Following MD:  Hillis Range, MD     ICD Vendor:  [employer]     ICD Model Number:  [id number]     ICD Serial Number:  [id number] ICD DOI:  10/02/2009     ICD Implanting MD:  Hillis Range, MD  Lead 1:    Location: RA     DOI: 10/02/2009     Model #: [id number]     Serial #: [id number]     Status: active Lead 2:    Location: RV     DOI: 10/02/2009     Model #: [id number]     Serial #: [id number]      Status: active Lead 3:    Location: LV     DOI: 10/02/2009     Model #: [id number]     Serial #: [id number]     Status: active  Indications::  CM

## 2010-02-21 NOTE — Progress Notes (Signed)
Summary: dyspnea   Phone Note Call from Patient   Summary of Call: States she is having a hard time getting good air in.  States air is really humid.  Chest discomfort when trying to get deep breath in.  Advised her to call Dr. Margo Common and try to be seen today so they can listen to lungs and evaluate her.  Advised her that if PMD feels that she needs to be seen sooner than her next OV (July) that his office would need to call and request that.  Patient verbalized understanding.    Follow-up for Phone Call        we also need to see her in office in the next few weeks. She likely has heart failure symptoms. Follow-up by: Lewayne Bunting, MD, [facility],  June 28, 2009 3:29 PM  Additional Follow-up for Phone Call Additional follow up Details #1::        No answer.  Checked [facility] and pt. is currently in hosp.  Gene did consult on pt. today. Hoover Brunette, LPN  June 29, 1608 2:59 OK Lewayne Bunting, MD, [facility]  June 29, 2009 3:23 PM

## 2010-02-21 NOTE — Progress Notes (Signed)
Summary: Wants something less expensive than Diovan  Medications Added LOSARTAN POTASSIUM 25 MG TABS (LOSARTAN POTASSIUM) Take 1 tablet by mouth once a day       Phone Note Call from Patient Call back at [facility] Phone [phone]   Summary of Call: Pt states she mentioned to Gene at recent office visit replacing Diovan with something less expensive. She would like to know if she can do this b/c she is almost out of her Diovan.  Initial call taken by: Cyril Loosen, RN, BSN,  July 19, 2009 2:34 PM  Follow-up for Phone Call        recommend switching to losartan 25 mg, the lowest dose, daily. #30, 3 refills. Follow-up by: Nelida Meuse, PA-C,  July 19, 2009 5:19 PM  Additional Follow-up for Phone Call Additional follow up Details #1::        Pt notifed and verbalized understanding. Additional Follow-up by: Cyril Loosen, RN, BSN,  July 20, 2009 10:20 AM    New/Updated Medications: LOSARTAN POTASSIUM 25 MG TABS (LOSARTAN POTASSIUM) Take 1 tablet by mouth once a day Prescriptions: LOSARTAN POTASSIUM 25 MG TABS (LOSARTAN POTASSIUM) Take 1 tablet by mouth once a day  #30 x 3   Entered by:   Cyril Loosen, RN, BSN   Authorized by:   Nelida Meuse, PA-C   Signed by:   Cyril Loosen, RN, BSN on 07/20/2009   Method used:   Electronically to        [facility]* (retail)       [address]       [place], [place]  [address]       Ph: [phone]       Fax: [phone]   RxID:   [id number]

## 2010-02-21 NOTE — Cardiovascular Report (Signed)
Summary: Pre Op Orders   Pre Op Orders   Imported By: Roderic Ovens 10/04/2009 15:26:57  _____________________________________________________________________  External Attachment:    Type:   Image     Comment:   External Document

## 2010-02-21 NOTE — Procedures (Signed)
Summary: Holter and Event/ CARDIONET  Holter and Event/ CARDIONET   Imported By: Dorise Hiss 07/18/2009 10:54:34  _____________________________________________________________________  External Attachment:    Type:   Image     Comment:   External Document

## 2010-02-21 NOTE — Consult Note (Signed)
Summary: CARDIOLOGY CONSULT/ [facility]  CARDIOLOGY CONSULT/ [facility]   Imported By: Zachary George 07/14/2009 13:54:52  _____________________________________________________________________  External Attachment:    Type:   Image     Comment:   External Document

## 2010-02-21 NOTE — Assessment & Plan Note (Signed)
Summary: eph-post [facility] 6/9 per gene add this day      Allergies Added:   Visit Type:  hospital follow-up Primary Provider:  Tapper   History of Present Illness: 65 year old female, well known to Dr. Andee Lineman with history of severe nonischemic cardiomyopathy, recently hospitalized here with atypical chest pain and shortness of breath. She had no evidence of ischemia or congestive heart failure, with a BNP level of 52, normal cardiac enzymes, and a negative chest x-ray. We ordered a repeat echocardiogram which yielded an EF of 25-30%, with mild mitral regurgitation. Previous studies indicated an EF of 15-20%. No medication adjustments were made, and we recommended early clinic followup for discussion of ICD implantation. Of note, this had been recommended in the past, but patient had previously declined, secondary to severe financial constraints. She recently informed us, however, that she now has Medicare, and is amenable to proceed.  Clinically, patient is "feeling pretty good" today, denying any chest pain or symptoms suggestive of decompensated heart failure. She has not had any post hospital followup labs.  Preventive Screening-Counseling & Management  Alcohol-Tobacco     Smoking Status: quit     Year Quit: 1998  Current Medications (verified): 1)  Carvedilol 3.125 Mg Tabs (Carvedilol) .... Take 1 Tablet By Mouth Two Times A Day 2)  Aspirin 81 Mg Tbec (Aspirin) .... Take 1 Tablet By Mouth Once A Day 3)  Diovan 40 Mg Tabs (Valsartan) .... Take 1 Tablet By Mouth Once A Day 4)  Furosemide 80 Mg Tabs (Furosemide) .... Take 1 Tablet By Mouth Once A Day 5)  Levothyroxine Sodium 75 Mcg Tabs (Levothyroxine Sodium) .... Take 1 Tablet By Mouth Once A Day 6)  Alprazolam 0.5 Mg Tabs (Alprazolam) .... As Needed 7)  Digoxin 0.125 Mg Tabs (Digoxin) .... Take 1 Tablet By Mouth Once A Day 8)  Spironolactone 25 Mg Tabs (Spironolactone) .... Take 1 Tablet By Mouth Once A Day  Allergies (verified): 1)  !  Pcn 2)  ! Keflex 3)  ! Levaquin 4)  ! Clindamycin 5)  ! * Xray Dye 6)  ! * Latex  Comments:  Nurse/Medical Assistant: The patient's medication list and allergies were reviewed with the patient and were updated in the Medication and Allergy Lists.  Past History:  Past Medical History: Last updated: 11/29/2008 CONGESTIVE HEART FAILURE, UNSPECIFIED (ICD-428.0) CARDIOMYOPATHY, SECONDARY (ICD-425.9) Hypothyroidism ICD  declined by the patient 1. Nonischemic cardiomyopathy, severe LV dysfunction, ejection     fraction 15-20%, maximum medical therapy. 2. Congestive heart failure class II B/III. 3. Bundle-branch block. 4. Rule out hypothyroidism on amiodarone drug therapy. 5. The patient declined ICD implant.  Review of Systems       No fevers, chills, hemoptysis, dysphagia, melena, hematocheezia, hematuria, rash, claudication, orthopnea, pnd, pedal edema. All other systems negative.   Vital Signs:  Patient profile:   65 year old female Height:      64 inches Weight:      211 pounds Pulse rate:   64 / minute BP sitting:   106 / 72  (left arm) Cuff size:   large  Vitals Entered By: Carlye Grippe (July 14, 2009 2:31 PM)  Physical Exam  Additional Exam:  GEN:A 65 year old female, sitting upright, in no distress HEENT: NCAT,PERRLA,EOMI NECK: palpable pulses, no bruits; no JVD; no TM LUNGS: CTA bilaterally HEART: RRR (S1S2); no significant murmurs; no rubs; no gallops ABD: soft, NT; intact BS EXT: intact distal pulses; no edema SKIN: warm, dry MUSC: no obvious deformity NEURO:  A/O (x3)     Impression & Recommendations:  Problem # 1:  CARDIOMYOPATHY, DILATED (ICD-425.4)  the patient is now amenable to recommendation for ICD implantation, given that she now has health insurance. Following discussion with Dr. Andee Lineman, we will schedule an appointment for her to see Dr. Hillis Range in [place] office, to discuss this option. We will also order followup metabolic  profile, as well as a TSH level, given that the ladder was recently 0.13. Patient is on thyroid replacement.  Problem # 2:  CHRONIC SYSTOLIC HEART FAILURE (ICD-428.22)  pt remains euvolemic by history and examination, and had a recent BNP level of 52. A recent 2-D echo indicated persistent LV dysfunction, with an EF of 25-30%. This compares to previous studies of 15-20%. No further workup recommended, at present time.  Problem # 3:  HYPOTHYROIDISM (ICD-244.9)  as noted, will reassess with a repeat TSH level. If still abnormal, will defer Dr. Margo Common for further management.  Problem # 4:  MITRAL REGURGITATION (ICD-396.3) Assessed as moderate, by recent echocardiogram. We'll continue to monitor her clinically. We'll schedule return clinic visit with myself and Dr. Andee Lineman in 3 months.  Other Orders: T-Basic Metabolic Panel [phone]) T-TSH [phone])  Patient Instructions: 1)  Labs:  BMET, TSH 2)  Refer to Dr. Johney Frame for ? ICD implantation 3)  Follow up in  3 months

## 2010-02-21 NOTE — Letter (Signed)
Summary: Implantable Device Instructions  Architectural technologist, Main Office  [address]   [place], [place] [address]   Phone: [phone]  Fax: [phone]      Implantable Device Instructions  You are scheduled for:  CRT-D Implant  on 10/02/2009 with Dr. Johney Frame.  1.  Please arrive at the Short Stay Center at [facility] at 8:30am on the day of your procedure.  2.  Do not eat or drink the night before your procedure.  3.  Complete lab work on 09/26/09 in [place] at the [facility] do not have to be fasting.  4.  Do NOT take these medications for the morning of your procedure:  Furosemide.    5.  Plan for an overnight stay.  Bring your insurance cards and a list of your medications.  6.  Wash your chest and neck with antibacterial soap (any brand) the evening before and the morning of your procedure.  Rinse well.  7.  Education material received:     CRT  *If you have ANY questions after you get home, please call the office [phone]. Melissa Paul  *Every attempt is made to prevent procedures from being rescheduled.  Due to the nauture of Electrophysiology, rescheduling can happen.  The physician is always aware and directs the staff when this occurs.     I will call in Prednisone and Pepcide for you to take 12-18 hours prior to your procedure

## 2010-02-21 NOTE — Assessment & Plan Note (Signed)
Summary: EST - POSSIBLE ICD PLACEMENT  Medications Added DIAZEPAM 2 MG TABS (DIAZEPAM) 1 three times a day      Allergies Added:   Visit Type:  Initial Consult Referring Provider:  Dr Andee Lineman Primary Provider:  Margo Common  CC:  no complaints.  History of Present Illness: Melissa Paul is a pleasant 65 year old female, well known to Dr. Andee Lineman with history of severe nonischemic cardiomyopathy who presents today for EP consultation regarding her risk for sudden death.  She reports being diagnosed with a nonischemic CM 3/09 after presenting with acute decompensated CHF.  I have reviewed LHC from that time which revealed no CAD.  Her EF was 15-20%.  Despite medical therapy, she continues to have a depressed EF (25-30%) with NYHA Class III symptoms.  She also has a LBBB.   She reports dyspnea with moderate activity.  She has difficulty shopping for groceries due to SOB and has to stop frequently.  She has 2 pillow orthopnea chronically.  Her edema is controlled with lasix. She reports occasional palpitations, though a recent holter 6/11 was uneventful.  She denies episodes of chest pain, presyncope, or syncope.     Current Medications (verified): 1)  Carvedilol 3.125 Mg Tabs (Carvedilol) .... Take 1 Tablet By Mouth Two Times A Day 2)  Aspirin 81 Mg Tbec (Aspirin) .... Take 1 Tablet By Mouth Once A Day 3)  Furosemide 80 Mg Tabs (Furosemide) .... Take 1 Tablet By Mouth Once A Day 4)  Levothyroxine Sodium 75 Mcg Tabs (Levothyroxine Sodium) .... Take 1 Tablet By Mouth Once A Day 5)  Digoxin 0.125 Mg Tabs (Digoxin) .... Take 1 Tablet By Mouth Once A Day 6)  Spironolactone 25 Mg Tabs (Spironolactone) .... Take 1 Tablet By Mouth Once A Day 7)  Losartan Potassium 25 Mg Tabs (Losartan Potassium) .... Take 1 Tablet By Mouth Once A Day 8)  Diazepam 2 Mg Tabs (Diazepam) .Marland Kitchen.. 1 Three Times A Day  Allergies (verified): 1)  ! Pcn 2)  ! Keflex 3)  ! Levaquin 4)  ! Clindamycin 5)  ! * Xray Dye 6)  ! *  Latex  Past History:  Past Medical History: Nonischemic CM (EF25-30%) NYHA Class III CHF LBBB Hypothyroidism recurrent UTI glucose interolance HL  Past Surgical History: s/p cholecystectomy TAH and R ovary removed s/p APPY L knee arthroscopy  Family History: Negative FH of Diabetes, Hypertension, or Coronary Artery Disease Her mother had a "weak heart"  Social History: Married and lives in [place]. Not employed. Tobacco Use - quit 11 years ago ETOH- none Drugs- none  Review of Systems       All systems are reviewed and negative except as listed in the HPI.  30 lb weight loss due to chronic nausea over last 3 months.  Vital Signs:  Patient profile:   65 year old female Height:      64 inches Weight:      206.50 pounds BMI:     35.57 Pulse rate:   60 / minute BP sitting:   102 / 60  (left arm) Cuff size:   large  Vitals Entered By: Laurance Flatten CMA (September 04, 2009 10:48 AM)  Physical Exam  General:  overweight, NAD Head:  normocephalic and atraumatic Eyes:  PERRLA/EOM intact; conjunctiva and lids normal. Mouth:  Teeth, gums and palate normal. Oral mucosa normal. Neck:  Neck supple, no JVD. No masses, thyromegaly or abnormal cervical nodes. Lungs:  Clear bilaterally to auscultation and percussion. Heart:  Non-displaced PMI,  chest non-tender; regular rate and rhythm, S1, S2 without murmurs, rubs or gallops. Carotid upstroke normal, no bruit. Normal abdominal aortic size, no bruits. Femorals normal pulses, no bruits. Pedals normal pulses. No edema, no varicosities. Abdomen:  Bowel sounds positive; abdomen soft and non-tender without masses, organomegaly, or hernias noted. No hepatosplenomegaly. Msk:  Back normal, normal gait. Muscle strength and tone normal. Pulses:  pulses normal in all 4 extremities Extremities:  No clubbing or cyanosis. Neurologic:  Alert and oriented x 3. Skin:  Intact without lesions or rashes. Cervical Nodes:  no significant  adenopathy Psych:  Normal affect.   EKG  Procedure date:  09/04/2009  Findings:      sinus rhythm 60 bpm, PR 128, QRS , LBBB  Impression & Recommendations:  Problem # 1:  CHRONIC SYSTOLIC HEART FAILURE (ICD-428.22) The patient has a nonischemic CM (EF 25-30%), NYHA Class III CHF, and LBBB.  She meets SCD-HeFT criteria for ICD implantation for primary prevention of sudden death.  IN addition, given LBBB and NYHA CLass III CHF, she may also benefir from resynchronization therapy.   Risks, benefits, alternatives to BiVICD implantation were discussed in detail with the patient today.  He understands that the risks include but are not limited to bleeding, infection, pneumothorax, perforation, tamponade, vascular damage, renal failure, MI, stroke, death, inappropriate shocks, and lead dislodgement.  The patient accepts these risks and wishes to proceed.  She will need steriod premedication prior to the procedure due to contrast allergy.  We will offer enrollment in Quickflex micro study.  Other Orders: EKG w/ Interpretation (93000)

## 2010-02-21 NOTE — Consult Note (Signed)
Summary: [facility]  [facility]   Imported By: Zachary George 07/14/2009 13:53:56  _____________________________________________________________________  External Attachment:    Type:   Image     Comment:   External Document

## 2010-02-21 NOTE — Procedures (Signed)
Summary: wound check.[email or web address].amber      Allergies Added:   Current Medications (verified): 1)  Carvedilol 3.125 Mg Tabs (Carvedilol) .... Take 1 Tablet By Mouth Two Times A Day 2)  Aspirin 81 Mg Tbec (Aspirin) .... Take 1 Tablet By Mouth Once A Day 3)  Furosemide 80 Mg Tabs (Furosemide) .... Take 1 Tablet By Mouth Once A Day 4)  Levothyroxine Sodium 75 Mcg Tabs (Levothyroxine Sodium) .... Take 1 Tablet By Mouth Once A Day 5)  Digoxin 0.125 Mg Tabs (Digoxin) .... Take 1 Tablet By Mouth Once A Day 6)  Losartan Potassium 25 Mg Tabs (Losartan Potassium) .... Take 1 Tablet By Mouth Once A Day 7)  Diazepam 2 Mg Tabs (Diazepam) .Marland Kitchen.. 1 Three Times A Day  Allergies (verified): 1)  ! Pcn 2)  ! Keflex 3)  ! Levaquin 4)  ! Clindamycin 5)  ! * Xray Dye 6)  ! * Latex   ICD Specifications Following MD:  Hillis Range, MD     ICD Vendor:  St Jude     ICD Model Number:  [phone]     ICD Serial Number:  [id number] ICD DOI:  10/02/2009     ICD Implanting MD:  Hillis Range, MD  Lead 1:    Location: RA     DOI: 10/02/2009     Model #: [id number]     Serial #: [id number]     Status: active Lead 2:    Location: RV     DOI: 10/02/2009     Model #: [id number]     Serial #: [id number]     Status: active Lead 3:    Location: LV     DOI: 10/02/2009     Model #: 1258T     Serial #: [id number]     Status: active  Indications::  CM   ICD Follow Up Battery Voltage:  >95% V     Charge Time:  7.9 seconds     Battery Est. Longevity:  3.8-4.2 yrs Underlying rhythm:  SR   ICD Device Measurements Atrium:  Amplitude: 2.4 mV, Impedance: 390 ohms, Threshold: 0.5 V at 0.5 msec Right Ventricle:  Amplitude: 12.0 mV, Impedance: 450 ohms, Threshold: 0.5 V at 0.5 msec Left Ventricle:  Impedance: 340 ohms, Threshold: 0.75 V at 0.5 msec Shock Impedance: 66 ohms   Episodes MS Episodes:  2     Percent Mode Switch:  <1%     Shock:  0     ATP:  0     Nonsustained:  0     Atrial Therapies:  0 Atrial Pacing:  46%     Ventricular Pacing:   59%  Brady Parameters Mode DDDR     Lower Rate Limit:  60     Upper Rate Limit 120 PAV 180     Sensed AV Delay:  160  Tachy Zones VF:  222     VT:  176     Next Cardiology Appt Due:  01/10/2010 Tech Comments:  WOUND CHECK--STERI STRIPS REMOVED.  NO REDNESS OR SWELLING AT SITE.  NORMAL DEVICE FUNCTION.  PT ONLY BP 59%--QUICK OPT RECOMMENDED FOLLOWING CHANGES: SAV FROM 160 TO  LV+RV SIMULTANEOUS (INSTEAD LV FIRST).  PROGRAMMED W/RECOMMENDED CHANGES TO PROMOTE BP.  PT WOULD LIKE TO BE ENROLLED IN [place].  NOTIFIER DEMONSTRATED FOR PT AND INSTRUCTIONS ON GETTING A SHOCK WAS WENT OVER.  PT VERBALIZES UNDERSTANDING.  ROV 01-10-10 W/JA IN [place]. Vella Kohler  October 13, 2009 4:02 PM  Appended Document: wound check.[email or web address].amber Not well BiV paced Above changes noted. Would also increse coreg to 6.25mg  two times a day for better rate control to promote BiV pacing.  Appended Document: wound check.[email or web address].amber    Clinical Lists Changes  Medications: Changed medication from CARVEDILOL 3.125 MG TABS (CARVEDILOL) Take 1 tablet by mouth two times a day to CARVEDILOL 6.25 MG TABS (CARVEDILOL) Take one tablet by mouth twice a day - Signed Rx of CARVEDILOL 6.25 MG TABS (CARVEDILOL) Take one tablet by mouth twice a day;  #60 x 6;  Signed;  Entered by: Cyril Loosen, RN, BSN;  Authorized by: Hillis Range, MD;  Method used: Electronically to [facility] [address] [place], [place], [place]  [address], Ph: [phone], Fax: [phone]    Prescriptions: CARVEDILOL 6.25 MG TABS (CARVEDILOL) Take one tablet by mouth twice a day  #60 x 6   Entered by:   Cyril Loosen, RN, BSN   Authorized by:   Hillis Range, MD   Signed by:   Cyril Loosen, RN, BSN on 10/19/2009   Method used:   Electronically to        [facility]* (retail)       [address]       [place], [place]  [address]       Ph: [phone]       Fax: [phone]   RxID:   [id number]

## 2010-02-21 NOTE — Progress Notes (Signed)
Summary: Patient thinks she should have med change   Phone Note Call from Patient Call back at Home Phone [phone]   Caller: patient walk in Reason for Call: Talk to Nurse Summary of Call: patient was told by someone in [place] office that they would be changing her coreg medication? She checked with [facility] in [place] and nothing has been called in yet.  Please contact patient in reference to this due to her not knowing what the new Rx should be.   Initial call taken by: Claudette Laws,  October 23, 2009 12:11 PM  Follow-up for Phone Call        Attempted to reach pt. No answer, no machine. RX was sent to [facility] which was the pharmacy listed in her chart. Cyril Loosen, RN, BSN  October 23, 2009 3:45 PM. Pt's dgt called. They wanted rx send to [facility]. This will be resent. Follow-up by: Cyril Loosen, RN, BSN,  October 23, 2009 4:46 PM    Prescriptions: CARVEDILOL 6.25 MG TABS (CARVEDILOL) Take one tablet by mouth twice a day  #60 x 6   Entered by:   Cyril Loosen, RN, BSN   Authorized by:   Hillis Range, MD   Signed by:   Cyril Loosen, RN, BSN on 10/23/2009   Method used:   Electronically to        [facility] (retail)       [address]       [place], [place]  [address]       Ph: [phone]       Fax: [phone]   RxID:   [id number]

## 2010-02-21 NOTE — Cardiovascular Report (Signed)
Summary: Implantable Device Registration Form  Implantable Device Registration Form   Imported By: Roderic Ovens 10/20/2009 12:51:18  _____________________________________________________________________  External Attachment:    Type:   Image     Comment:   External Document

## 2010-02-21 NOTE — Progress Notes (Signed)
 ----   Converted from flag ---- ---- 09/06/2009 5:50 PM, Dennis Bast, RN, BSN wrote: With  thx  ---- 09/04/2009 1:23 PM, Zachary George wrote: will the CBC be with or without Diff?  ---- 09/04/2009 12:36 PM, Dennis Bast, RN, BSN wrote: pt needs labs on 09/26/09 at [facility] for procedure bmp/cbc/pt/ptt428.22 ------------------------------

## 2010-02-21 NOTE — Cardiovascular Report (Signed)
Summary: Office Visit   Office Visit   Imported By: Roderic Ovens 10/20/2009 12:50:24  _____________________________________________________________________  External Attachment:    Type:   Image     Comment:   External Document

## 2010-02-22 NOTE — Assessment & Plan Note (Signed)
Summary: defib check/sjm/quickflex/amber  Medications Added LEVOTHYROXINE SODIUM 75 MCG TABS (LEVOTHYROXINE SODIUM) Take 1/2 tablet by mouth once a day      Allergies Added:   Visit Type:  Pacemaker check Referring Provider:  Dr Andee Lineman Primary Provider:  Margo Common   History of Present Illness: The patient presents today for routine electrophysiology followup. She reports doing well since her BiV pacemaker was implanted.  She has developed resolution of chest heaviness with biv pacing.  She continues to have dypsnea with moderate activity.  She is actively trying to lose weight and has lost 40 lbs.  The patient denies symptoms of palpitations, chest pain,orthopnea, PND, lower extremity edema, dizziness, presyncope, syncope, or neurologic sequela. The patient is tolerating medications without difficulties and is otherwise without complaint today.   Preventive Screening-Counseling & Management  Alcohol-Tobacco     Smoking Status: quit     Year Quit: 1998  Current Medications (verified): 1)  Carvedilol 6.25 Mg Tabs (Carvedilol) .... Take One Tablet By Mouth Twice A Day 2)  Aspirin 81 Mg Tbec (Aspirin) .... Take 1 Tablet By Mouth Once A Day 3)  Furosemide 80 Mg Tabs (Furosemide) .... Take 1 Tablet By Mouth Once A Day 4)  Levothyroxine Sodium 75 Mcg Tabs (Levothyroxine Sodium) .... Take 1/2 Tablet By Mouth Once A Day 5)  Digoxin 0.125 Mg Tabs (Digoxin) .... Take 1 Tablet By Mouth Once A Day 6)  Losartan Potassium 25 Mg Tabs (Losartan Potassium) .... Take 1 Tablet By Mouth Once A Day 7)  Diazepam 2 Mg Tabs (Diazepam) .Marland Kitchen.. 1 Three Times A Day 8)  Miralax  Powd (Polyethylene Glycol 3350) .... Use As Directed Daily 9)  Spironolactone 25 Mg Tabs (Spironolactone) .... Take 1 Tablet By Mouth Once A Day  Allergies (verified): 1)  ! Pcn 2)  ! Keflex 3)  ! Levaquin 4)  ! Clindamycin 5)  ! * Xray Dye 6)  ! * Latex  Comments:  Nurse/Medical Assistant: The patient's medication list and  allergies were reviewed with the patient and were updated in the Medication and Allergy Lists.  Past History:  Past Medical History: Reviewed history from 11/27/2009 and no changes required. Nonischemic CM (EF25-30%) echocardiogram June 2011 NYHA Class III CHF LBBB Hypothyroidism recurrent UTI glucose interolance HL Status post CRT-D implantation September 2011 NYHA class III Mild to moderate mitral regurgitation  Past Surgical History: s/p cholecystectomy TAH and R ovary removed s/p APPY L knee arthroscopy s/p BiV ICD 9/11  Social History: Reviewed history from 09/04/2009 and no changes required. Married and lives in [place]. Not employed. Tobacco Use - quit 11 years ago ETOH- none Drugs- none  Review of Systems       All systems are reviewed and negative except as listed in the HPI.   Vital Signs:  Patient profile:   65 year old female Height:      64 inches Weight:      204 pounds Pulse rate:   84 / minute BP sitting:   103 / 67  (left arm) Cuff size:   large  Vitals Entered By: Carlye Grippe (January 10, 2010 3:29 PM)  Physical Exam  General:  overweight, NAD Head:  normocephalic and atraumatic Eyes:  PERRLA/EOM intact; conjunctiva and lids normal. Mouth:  Teeth, gums and palate normal. Oral mucosa normal. Chest Wall:  ICD pocket is well healed Lungs:  Clear bilaterally to auscultation and percussion. Heart:  Non-displaced PMI, chest non-tender; regular rate and rhythm, S1, S2 without murmurs, rubs or  gallops. Carotid upstroke normal, no bruit. Normal abdominal aortic size, no bruits. Femorals normal pulses, no bruits. Pedals normal pulses. No edema, no varicosities. Abdomen:  Bowel sounds positive; abdomen soft and non-tender without masses, organomegaly, or hernias noted. No hepatosplenomegaly. Msk:  Back normal, normal gait. Muscle strength and tone normal. Extremities:  No clubbing or cyanosis. Neurologic:  Alert and oriented x 3.    ICD  Specifications Following MD:  Hillis Range, MD     ICD Vendor:  St Jude     ICD Model Number:  [phone]     ICD Serial Number:  [id number] ICD DOI:  10/02/2009     ICD Implanting MD:  Hillis Range, MD  Lead 1:    Location: RA     DOI: 10/02/2009     Model #: [id number]     Serial #: [id number]     Status: active Lead 2:    Location: RV     DOI: 10/02/2009     Model #: 7122     Serial #: [id number]     Status: active Lead 3:    Location: LV     DOI: 10/02/2009     Model #: 1258T     Serial #: [id number]     Status: active  Indications::  CM   ICD Follow Up Battery Voltage:  91% V     Charge Time:  7.9 seconds     Battery Est. Longevity:  3.8-4.3 yrs Underlying rhythm:  SR   ICD Device Measurements Atrium:  Amplitude: 0.9 mV, Impedance: 450 ohms, Threshold: 0.5 V at 0.5 msec Right Ventricle:  Amplitude: 12.0 mV, Impedance: 640 ohms, Threshold: 0.5 V at 0.5 msec Left Ventricle:  Impedance: 590 ohms, Threshold: 0.75 V at 0.5 msec Shock Impedance: 70 ohms   Episodes MS Episodes:  8     Percent Mode Switch:  <1%     Shock:  0     ATP:  0     Nonsustained:  0     Atrial Therapies:  0 Atrial Pacing:  54%     Ventricular Pacing:  92%  Brady Parameters Mode DDDR     Lower Rate Limit:  60     Upper Rate Limit 120 PAV 160     Sensed AV Delay:  110  Tachy Zones VF:  222     VT:  176     Next Remote Date:  04/12/2010     Next Cardiology Appt Due:  12/24/2010 Tech Comments:  8 AMS EPISODES--LONGEST WAS 4 SECONDS.  NORMAL DEVICE FUNCTION. QUICK OPT PERFORMED.  CHANGED PAV DELAY FROM 180 TO 160 AND SAV DELAY FROM 130 TO 110.  CHANGED VENTRICULAR PACING CHAMBER LV FIRST THEN RV.  CHANGED OUTPUTS TO CHROINIC RA 2.0 RV 2.5 AND LV 2.5. [place]LIN 04-12-10 AND ROV IN 12 MTHS W/JA. Vella Kohler  January 10, 2010 4:12 PM MD Comments:  agree  Impression & Recommendations:  Problem # 1:  CHRONIC SYSTOLIC HEART FAILURE (ICD-428.22) stable without robust response to BiV pacing. Device interrogation today reveals normal  lead/ device function. She is BiV paced >90% of the time.  Changes were made today to promote resynchronization as above. no medicine changes.  Patient Instructions: 1)  [place] checks every 3 months 2)  return to device clinic in 12 months

## 2010-02-22 NOTE — Cardiovascular Report (Signed)
Summary: Card Device Clinic/ [facility] SUMMARY  Card Device Clinic/ [facility] SUMMARY   Imported By: Dorise Hiss 01/16/2010 16:13:14  _____________________________________________________________________  External Attachment:    Type:   Image     Comment:   External Document

## 2010-04-05 LAB — SURGICAL PCR SCREEN
MRSA, PCR: NEGATIVE
Staphylococcus aureus: NEGATIVE

## 2010-04-06 ENCOUNTER — Encounter (INDEPENDENT_AMBULATORY_CARE_PROVIDER_SITE_OTHER): Payer: Medicare Other

## 2010-04-06 ENCOUNTER — Encounter: Payer: Self-pay | Admitting: Internal Medicine

## 2010-04-06 DIAGNOSIS — I428 Other cardiomyopathies: Secondary | ICD-10-CM

## 2010-04-09 ENCOUNTER — Encounter (INDEPENDENT_AMBULATORY_CARE_PROVIDER_SITE_OTHER): Payer: Medicare Other

## 2010-04-09 ENCOUNTER — Encounter: Payer: Self-pay | Admitting: Internal Medicine

## 2010-04-09 DIAGNOSIS — I428 Other cardiomyopathies: Secondary | ICD-10-CM

## 2010-04-10 NOTE — Procedures (Signed)
Summary: defib check  Medications Added CETIRIZINE HCL 10 MG TABS (CETIRIZINE HCL) Take 1 tablet by mouth once daily (2 tablets if needed) RANITIDINE HCL 150 MG CAPS (RANITIDINE HCL) Take 1 capsule by mouth once daily      Allergies Added:   Current Medications (verified): 1)  Carvedilol 6.25 Mg Tabs (Carvedilol) .... Take One Tablet By Mouth Twice A Day 2)  Aspirin 81 Mg Tbec (Aspirin) .... Take 1 Tablet By Mouth Once A Day 3)  Furosemide 80 Mg Tabs (Furosemide) .... Take 1 Tablet By Mouth Once A Day 4)  Levothyroxine Sodium 75 Mcg Tabs (Levothyroxine Sodium) .... Take 1/2 Tablet By Mouth Once A Day 5)  Digoxin 0.125 Mg Tabs (Digoxin) .... Take 1 Tablet By Mouth Once A Day 6)  Losartan Potassium 25 Mg Tabs (Losartan Potassium) .... Take 1 Tablet By Mouth Once A Day 7)  Diazepam 2 Mg Tabs (Diazepam) .Marland Kitchen.. 1 Three Times A Day 8)  Miralax  Powd (Polyethylene Glycol 3350) .... Use As Directed Daily 9)  Spironolactone 25 Mg Tabs (Spironolactone) .... Take 1 Tablet By Mouth Once A Day 10)  Cetirizine Hcl 10 Mg Tabs (Cetirizine Hcl) .... Take 1 Tablet By Mouth Once Daily (2 Tablets If Needed) 11)  Ranitidine Hcl 150 Mg Caps (Ranitidine Hcl) .... Take 1 Capsule By Mouth Once Daily  Allergies (verified): 1)  ! Pcn 2)  ! Keflex 3)  ! Levaquin 4)  ! Clindamycin 5)  ! * Xray Dye 6)  ! * Latex   ICD Specifications Following MD:  Hillis Range, MD     ICD Vendor:  St Jude     ICD Model Number:  [phone]     ICD Serial Number:  [id number] ICD DOI:  10/02/2009     ICD Implanting MD:  Hillis Range, MD  Lead 1:    Location: RA     DOI: 10/02/2009     Model #: [id number]     Serial #: [id number]     Status: active Lead 2:    Location: RV     DOI: 10/02/2009     Model #: 7122     Serial #: [id number]     Status: active Lead 3:    Location: LV     DOI: 10/02/2009     Model #: 1258T     Serial #: [id number]     Status: active  Indications::  CM   Brady Parameters Mode DDDR     Lower Rate Limit:  60     Upper  Rate Limit 120 PAV 160     Sensed AV Delay:  110  Tachy Zones VF:  222     VT:  176     Tech Comments:  research pt. meds reviewed.  checked by industry.  see paceart report. Vella Kohler  April 06, 2010 10:59 AM

## 2010-04-14 ENCOUNTER — Encounter: Payer: Self-pay | Admitting: *Deleted

## 2010-04-16 ENCOUNTER — Ambulatory Visit (INDEPENDENT_AMBULATORY_CARE_PROVIDER_SITE_OTHER): Payer: Medicare Other | Admitting: Internal Medicine

## 2010-04-16 ENCOUNTER — Encounter: Payer: Self-pay | Admitting: Internal Medicine

## 2010-04-16 DIAGNOSIS — I428 Other cardiomyopathies: Secondary | ICD-10-CM

## 2010-04-16 DIAGNOSIS — Z950 Presence of cardiac pacemaker: Secondary | ICD-10-CM

## 2010-04-16 DIAGNOSIS — I447 Left bundle-branch block, unspecified: Secondary | ICD-10-CM

## 2010-04-16 DIAGNOSIS — I5022 Chronic systolic (congestive) heart failure: Secondary | ICD-10-CM

## 2010-04-16 NOTE — Progress Notes (Signed)
The patient presents today for electrophysiology followup. She recently presented to the device clinic and felt that her ICD had "moved" within the pocket.  She reports mild discomfort over her ICD.  She feels that her chest tightness with activity has improved, however, she continues to have dyspnea with moderate exertion.  Today, she denies symptoms of palpitations, chest pain,orthopnea, PND, lower extremity edema, dizziness, presyncope, syncope, or neurologic sequela.  The patient feels that she is tolerating medications without difficulties and is otherwise without complaint today.   Past Medical History  Diagnosis Date  . Nonischemic cardiomyopathy     EF 25-30% by echo 06/2009  . CHF (congestive heart failure)     NYHA Class III  . LBBB (left bundle branch block)   . Hypothyroidism   . UTI (urinary tract infection)     Recurrent  . Glucose intolerance (pre-diabetes)   . Hyperlipemia   . Mitral regurgitation     Mild to Moderate   Past Surgical History  Procedure Date  . Cholecystectomy   . Total abdominal hysterectomy     (R) ovary removed  . Appendectomy   . Knee arthroscopy     Left  . Cardiac defibrillator placement 09/2009    St. Jude BiV ICD by Dr. Shanna Cisco class III    Current outpatient prescriptions:aspirin 81 MG tablet, Take 1 tablet by mouth daily., Disp: , Rfl: ;  carvedilol (COREG) 6.25 MG tablet, Take 1 tablet by mouth two times a day. , Disp: , Rfl: ;  cetirizine (ZYRTEC) 10 MG tablet, Take 1-2 tablets by mouth daily. , Disp: , Rfl: ;  diazepam (VALIUM) 2 MG tablet, Take 1 tablet by mouth three times per day.  , Disp: , Rfl:  digoxin (LANOXIN) 0.125 MG tablet, Take 1 tablet by mouth daily. , Disp: , Rfl: ;  furosemide (LASIX) 80 MG tablet, Take 1 tablet by mouth daily.   , Disp: , Rfl: ;  levothyroxine (SYNTHROID, LEVOTHROID) 75 MCG tablet, Take 1/2 tablet by mouth daily.  , Disp: , Rfl: ;  losartan (COZAAR) 25 MG tablet, Take 1 tablet by mouth daily. , Disp: , Rfl:  ;  polyethylene glycol (MIRALAX / GLYCOLAX) packet, Use daily as directed. , Disp: , Rfl:  ranitidine (ZANTAC) 150 MG capsule, Take 1 tablet by mouth daily. , Disp: , Rfl: ;  spironolactone (ALDACTONE) 25 MG tablet, Take 1 tablet by mouth daily. , Disp: , Rfl:   Allergies  Allergen Reactions  . Cephalexin   . Clindamycin   . Latex   . Levofloxacin   . Penicillins     History   Social History  . Marital Status: Married    Spouse Name: N/A    Number of Children: N/A  . Years of Education: N/A   Occupational History  . Not on file.   Social History Main Topics  . Smoking status: Former Smoker    Quit date: 01/22/1996  . Smokeless tobacco: Not on file  . Alcohol Use: No  . Drug Use: No  . Sexually Active: Not on file   Other Topics Concern  . Not on file   Social History Narrative  . No narrative on file    Family History  Problem Relation Age of Onset  . Diabetes Neg Hx   . Hypertension Neg Hx   . Coronary artery disease Neg Hx    Physical Exam: Filed Vitals:   04/16/10 1404  BP: 92/60  Pulse: 65  Resp: 18  Height:  5\' 4"  (1.626 m)  Weight: 206 lb (93.441 kg)    GEN- The patient is well appearing obese female, alert and oriented x 3 today.   Head- normocephalic, atraumatic Eyes-  Sclera clear, conjunctiva pink Ears- hearing intact Oropharynx- clear Neck- supple, no JVP Lymph- no cervical lymphadenopathy Lungs- Clear to ausculation bilaterally, normal work of breathing Chest- ICD pocket is well healed and well positioned within the pocket, it is mildly tender but otherwise normal Heart- Regular rate and rhythm, no murmurs, rubs or gallops, PMI not laterally displaced GI- soft, NT, ND, + BS Extremities- no clubbing, cyanosis, or edema MS- no significant deformity or atrophy Skin- no rash or lesion Psych- euthymic mood, full affect Neuro- strength and sensation are intact

## 2010-04-16 NOTE — Patient Instructions (Signed)
A chest x-ray takes a picture of the organs and structures inside the chest, including the heart, lungs, and blood vessels. This test can show several things, including, whether the heart is enlarges; whether fluid is building up in the lungs; and whether pacemaker / defibrillator leads are still in place. Your physician recommends that you continue on your current medications as directed. Please refer to the Current Medication list given to you today.

## 2010-04-16 NOTE — Assessment & Plan Note (Signed)
Stable Her BiV ICD does not appear to have moved within the pocket. The patient does have some "noise" on the RA lead, however this is minimal and does not appear to be significantly affecting device function. RA lead impedance/ threshold/ sensing are all stable. I will continue to monitor, but would avoid lead revision at this time. We will obtain a PA/Lateral CXR.

## 2010-04-16 NOTE — Assessment & Plan Note (Signed)
-   As below

## 2010-04-16 NOTE — Assessment & Plan Note (Signed)
The patient has stable CHF.  She continues to have dyspnea with moderate activity.  I have reprogrammed her BiV today to promote resynchronization.  Per Quick opt, her AV delay is changed from 110 to .  I also changed her LV/RV timing from LV first by 30ms to simultaneous per Quick Opt recommendation.  She will continue her current medicine regimen.

## 2010-04-24 NOTE — Procedures (Signed)
Summary: sjm icd/?atrial lead revision/gt check per ja      Allergies Added:   Current Medications (verified): 1)  Carvedilol 6.25 Mg Tabs (Carvedilol) .... Take One Tablet By Mouth Twice A Day 2)  Aspirin 81 Mg Tbec (Aspirin) .... Take 1 Tablet By Mouth Once A Day 3)  Furosemide 80 Mg Tabs (Furosemide) .... Take 1 Tablet By Mouth Once A Day 4)  Levothyroxine Sodium 75 Mcg Tabs (Levothyroxine Sodium) .... Take 1/2 Tablet By Mouth Once A Day 5)  Digoxin 0.125 Mg Tabs (Digoxin) .... Take 1 Tablet By Mouth Once A Day 6)  Losartan Potassium 25 Mg Tabs (Losartan Potassium) .... Take 1 Tablet By Mouth Once A Day 7)  Diazepam 2 Mg Tabs (Diazepam) .Marland Kitchen.. 1 Three Times A Day 8)  Miralax  Powd (Polyethylene Glycol 3350) .... Use As Directed Daily 9)  Spironolactone 25 Mg Tabs (Spironolactone) .... Take 1 Tablet By Mouth Once A Day 10)  Cetirizine Hcl 10 Mg Tabs (Cetirizine Hcl) .... Take 1 Tablet By Mouth Once Daily (2 Tablets If Needed) 11)  Ranitidine Hcl 150 Mg Caps (Ranitidine Hcl) .... Take 1 Capsule By Mouth Once Daily  Allergies (verified): 1)  ! Pcn 2)  ! Keflex 3)  ! Levaquin 4)  ! Clindamycin 5)  ! * Xray Dye 6)  ! * Latex   ICD Specifications Following MD:  Hillis Range, MD     ICD Vendor:  [employer]     ICD Model Number:  [id number]     ICD Serial Number:  [id number] ICD DOI:  10/02/2009     ICD Implanting MD:  Hillis Range, MD  Lead 1:    Location: RA     DOI: 10/02/2009     Model #: [id number]     Serial #: [id number]     Status: active Lead 2:    Location: RV     DOI: 10/02/2009     Model #: [id number]     Serial #: [id number]     Status: active Lead 3:    Location: LV     DOI: 10/02/2009     Model #: [id number]     Serial #: [id number]     Status: active  Indications::  CM   Brady Parameters Mode DDDR     Lower Rate Limit:  60     Upper Rate Limit 120 PAV 160     Sensed AV Delay:  110  Tachy Zones VF:  222     VT:  176     Tech Comments:  Followup from check on 04-06-10---per Dr Taylor---pt to be  scheduled w/Dr Tiya Schrupp at next [facility] clinic.  pt scheduled for 04-16-10 @ 1530. Pt aware of appt.  Vella Kohler  April 09, 2010 3:31 PM

## 2010-04-25 NOTE — Progress Notes (Signed)
I have reviewed the patients chest Xray.  There is no obvious fracture or lead disturbance.  There is mild angulation of the atrial lead at the hemostatic stitch site, though this may be normal.

## 2010-05-29 ENCOUNTER — Other Ambulatory Visit: Payer: Self-pay | Admitting: Internal Medicine

## 2010-06-05 NOTE — Assessment & Plan Note (Signed)
[facility]                         ELECTROPHYSIOLOGY OFFICE NOTE   ANELISSE, JACOBSON                     MRN:          [id number]  DATE:10/26/2007                            DOB:          01/11/1946    Melissa Paul is referred today by Dr. Andee Lineman and Tereso Newcomer for  consideration biventricular device implantation.  The patient is a very  pleasant 65 year old woman.  She has a longstanding nonischemic  cardiomyopathy.  Her EF in the past is vary between 10 and 25%.  She has  left bundle-branch block with a QRS duration of 145 milliseconds and  class III heart failure.  This has been stable, but chronic over time.  The patient has a history of nonsustained VT as well.  She underwent  catheterization in March 2009 demonstrating normal coronary arteries.  Because of a nonsustained VT she has been placed in the past on  amiodarone.  She had problems with cough on an ACE inhibitor, but is on  an ARB drug.  She denies frank syncope.   CURRENT MEDICATIONS:  1. Digoxin 0.25 mg half tablet daily.  2. Carvedilol 3.125 twice daily.  3. Aspirin 81 a day.  4. Amiodarone 200 a day.  5. Diovan 40 a day, potassium 10 mEq daily and furosemide 20 a day.   Her family history was notable that her brothers and her mother all died  of problems with cardiomyopathy.   SOCIAL HISTORY:  The patient denies tobacco or ethanol use.  She denies  recreational drug use.  Her review of systems as noted in the HPI.  She  denies peripheral edema.  She does have some problems with abdominal  distention.  Her rest of review of systems was negative except as noted  above.  She also have some problems with pain in her joints particularly  her ankles.   PHYSICAL EXAMINATION:  GENERAL:  She is a pleasant 65 year old woman in  no acute distress.  VITAL SIGNS:  The blood pressure was 97/54, the pulse was 55 and  regular, respirations were 18, the weight was 206 pounds.  HEENT:  Normocephalic and atraumatic.  Pupils are equal and round.  Oropharynx  was moist.  Sclerae anicteric.  NECK:  No jugular venous distention.  There are no thyromegaly.  The  trachea is midline.  The carotids are 2+ and symmetric.  LUNGS:  Clear  bilaterally to auscultation.  No wheezes, rales, or rhonchi are present.  Cardiovascular:  Regular rate and rhythm.  Normal S1 and a split S2.  The PMI was enlarged and laterally displaced.  ABDOMEN:  Obese, nontender, nondistended, no organomegaly.  The bowel  sounds are present.  There is no rebound or guarding.  EXTREMITIES:  Trace peripheral edema.  There is no cyanosis or clubbing  noted.  NEUROLOGIC:  Alert and oriented x2.  The cranial nerves are intact.  Strength was 5/5 and symmetric.   The EKG demonstrates sinus rhythm with left bundle branch block at a  rate of 54 beats per minute (sinus brady).   IMPRESSION:  1. 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  2. Congestive heart failure class III.  3. Left bundle branch block.   DISCUSSION:  I have discussed treatment options with the patient and her  daughter who is with her today.  I have recommended implanting  biventricular defibrillator versus biventricular pacemaker.  The patient  is complete with her options.  She is concerned about the cost of the  device itself.  She would call us if she would like to proceed with  device implantation.     [name]. Ladona Ridgel, MD  Electronically Signed    GWT/MedQ  DD: 10/26/2007  DT: 10/27/2007  Job #: [id number]   cc:   Ellyn Hack, MD,FACC

## 2010-06-05 NOTE — Cardiovascular Report (Signed)
NAME:  Melissa Paul, Melissa Paul NO.:  [id number]   MEDICAL RECORD NO.:  [id number]          PATIENT TYPE:  INP   LOCATION:  4713                         FACILITY:  [facility]   PHYSICIAN:  [name], MDDATE OF BIRTH:  11-20-1945   DATE OF PROCEDURE:  04/13/2007  DATE OF DISCHARGE:                            CARDIAC CATHETERIZATION   IDENTIFICATION:  Melissa Paul is a very pleasant 65 year old woman with no  significant past medical history.  She does have about a 94-month history  of progressive shortness of breath with occasional chest pressure.  Echocardiogram showed an EF of less than 20%.  She was admitted by Dr.  Andee Lineman.  She has been having trouble tolerating ACE inhibitors and beta  blockers due to orthostatic hypotension.   PROCEDURES PERFORMED:  1. Right heart cath.  2. Selective coronary angiography.  3. Left heart cath.  4. Left ventriculogram.  5. StarClose femoral artery closure.   DESCRIPTION OF PROCEDURE:  The risks and indication of catheterization  were explained.  Consent was signed and placed on the chart.  A 5-French  arterial sheath was placed in right femoral artery using a modified  Seldinger technique.  Standard catheters including JL-4, JR-4 and angled  pigtail were used in the procedure.  All catheter exchanges made over  wire.  There were no apparent complications.  A 7-French venous sheath  was placed in right femoral vein.  Standard right heart cath was  performed using a Swan-Ganz catheter.  Once again, no apparent  complications.  At the end of the procedure, the patient's right femoral  artery site was closed with a StarClose device.  There was good  hemostasis.   HEMODYNAMIC RESULTS:  Right atrial pressure mean of 5, RV 32/4, PA  pressure was 29/13 with a mean of 21.  Pulmonary capillary wedge  pressure was a mean of 18.  Central aortic pressure was 100/60 with mean  of 75.  LV pressure was 92/90 with an EDP of 15.  Fick cardiac  output  5.8 liters per minute.  Cardiac index of 2.9 liters per minute per meter  squared.  There was no mitral stenosis or aortic stenosis.   Left main was normal.   LAD was a long vessel wrapping the apex.  It gave off a single large  bifurcating diagonal in the proximal portion thus was angiographically  normal.   Left circumflex gave off a large branching OM-1, a moderate-sized OM-2  was angiographically normal.   Right coronary artery was a moderate-sized dominant vessel, gave off a  PDA and posterolateral that was angiographically normal.   Left ventriculogram done in RAO position showed a dilated ventricle with  severe global hypokinesis.  Ejection fraction of 15-20%.   ASSESSMENT:  1. Severe nonischemic cardiomyopathy with normal filling pressures and      normal outputs.  There is no evidence of coronary artery disease.  2. Frequent PVCs and trigeminy and quadrigeminy pattern.  3. Relative intolerance of heart failure meds due to orthostatic      hypotension.   PLAN/DISCUSSION:  I discussed this case with Dr. Andee Lineman.  We will  continue to try titrating her heart failure regimen.  Will ask  Electrophysiology to see for possible by the biventricular ICD.      [name], MD  Electronically Signed     DRB/MEDQ  D:  04/13/2007  T:  04/13/2007  Job:  [id number]

## 2010-06-05 NOTE — Consult Note (Signed)
[name], HEINLE NO.:  [id number]   MEDICAL RECORD NO.:  [id number]          PATIENT TYPE:  INP   LOCATION:  4713                         FACILITY:  [facility]   PHYSICIAN:  [name]. Ladona Ridgel, MD    DATE OF BIRTH:  [birth year]   DATE OF CONSULTATION:  04/15/2007  DATE OF DISCHARGE:                                 CONSULTATION   Consultation is requested by Dr. Nicholes Mango.   INDICATION FOR CONSULTATION:  Evaluation of patient with congestive  heart failure, left bundle branch block, and nonsustained VT with very  frequent ventricular ectopy.   HISTORY OF PRESENT ILLNESS:  The patient is 65 years old, and her health  has been quite good.  She has a history of gastroesophageal reflux  disease and has had cholecystectomy and hysterectomy in the past.  She  notes that beginning about eight months ago, but much worse in the last  two weeks, she has shortness of breath.  During the last two weeks, she  notes that she would wake up at night short of breath and had to sit up.  She would also feel dizzy and lightheaded, but she never had frank  syncope.  She felt like her heart was beating irregularly.  She  presented to the hospital with congestive heart failure and has been  treated with diuretic therapy, initiation of beta blockers and ACE  inhibitors with overall improvement in her symptoms.  The patient,  however, has continued to have frequent episodes of ventricular ectopy  and nonsustained ventricular tachycardia, though she has had no  sustained runs.  She will feel like sometimes the blood is leaving her  head, but this lasts only for a few seconds.  Her heart failure symptoms  have much improved since initiation of medical therapy for her heart  failure.  Her additional past medical history is really unremarkable.   CURRENT MEDICATIONS:  1. Lisinopril 5 mg daily.  2. Coreg 3.125 twice daily.  3. Aspirin 81 a day.  4. Lovenox subcutaneous.  5. Digoxin 0.125  mg daily.  6. Amiodarone 400 mg twice daily which was just initiated.   SOCIAL HISTORY:  The patient denies tobacco or ethanol abuse.  She is  married.   FAMILY HISTORY:  Notable for a mother who died in her 51s with  questionable cardiomyopathy.  Father died in his 39s of cancer of the  throat.   Her review of systems is as noted in the HPI.  She also has  gastroesophageal reflux and dyspepsia from that.  Otherwise, all systems  reviewed and found to be negative except as noted in the HPI.  She has  been very mild case of psoriasis with pruritus of the skin.   On physical exam, she is a pleasant, well-appearing, middle-aged woman  in no acute distress.  The blood pressure was 98/55, the pulse 64 and  irregular, respirations were 20.  The temperature was 98.  HEENT:  Normocephalic and atraumatic.  Pupils equal and round.  Oropharynx is moist.  Sclerae were anicteric.  The neck revealed no jugular distention.  There was no thyromegaly.  Trachea was midline.  Carotid 2+ and symmetric.  The lungs were clear bilaterally to auscultation.  No wheezes, rales, or  rhonchi were present.  There was no increased work of breathing.  CARDIOVASCULAR EXAM:  Revealed an irregular rhythm with normal S1 and a  split S2.  I did not appreciate any murmurs.  There is a very soft S3  gallop.  The PMI was enlarged and laterally displaced.  The abdominal exam was soft, nontender, nondistended.  There was no  organomegaly.  Bowel sounds were present.  There was no rebound or  guarding.  EXTREMITIES:  Demonstrate no cyanosis, clubbing or edema.  Pulses were  2+ and symmetric.  The neurologic exam was alert and oriented x3 with cranial nerves  intact.  Strength was 5/5 symmetric.  Joint exam was normal.  The musculoskeletal exam was normal.   The EKG demonstrates sinus rhythm with frequent PVCs and a left bundle  branch block pattern.  The morphology the PVCs is negative axis with  transition from  negative to positive from lead V2 to V3.  The overall  QRS duration was not much different than that of baseline ECGs.  There  is very poor R-wave progression across the anterior precordium in sinus  rhythm.   IMPRESSION:  1. 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  2. Very frequent premature ventricular contractions and nonsustained      ventricular tachycardia as just described, questionable related to      the cardiomyopathy.  3. Class III to IV heart failure on presentation, now class II.  4. Left bundle branch block with QRS duration between 125 and 130      milliseconds.   DISCUSSION:  I agree that the patient's ventricular ectopy could be  contributing to the congestive heart failure, but I suspect that is  actually a consequence of LV scarring rather than the cause of the  patient's LV dysfunction.  Her PVC morphology suggests that the ectopy  that she has is originating high in the LV septum, raising the question  of bundle branch reentrant beats which are commonly seen in patients  with conduction disease and nonischemic cardiomyopathy.  For now, I  think trying to suppress her ambient ectopy makes some sense along with  aggressive medical therapy for heart failure and would agree with  initiation of amiodarone therapy, continuation of ACE inhibitors, beta  blockers and digoxin, and observation in the hospital for bradycardia or  other symptoms.  We would also to see that her ectopy improves on these  medications.  In two to three months as an outpatient, she will need an  echocardiogram and would recommend that she progress her activities  while she is in the hospital.  Ultimately, ICD implantation will be a  strong consideration for this patient, perhaps with cardiac  resynchronization therapy pending her overall LV function as an  outpatient.      [name]. Ladona Ridgel, MD  Electronically Signed     GWT/MEDQ  D:  04/15/2007  T:   04/15/2007  Job:  [id number]   cc:   Learta Codding, MD,FACC  Wyvonnia Lora

## 2010-06-05 NOTE — Assessment & Plan Note (Signed)
[facility]                          [facility] OFFICE NOTE   MERRIL, ISAKSON                     MRN:          [id number]  DATE:04/10/2007                            DOB:          [birth year]    REASON FOR CONSULTATION:  Referral for dyspnea and newly diagnosed  cardiomyopathy with ejection fraction less than 20%.   HISTORY OF PRESENT ILLNESS:  The patient is a 65 year old female with a  family history of possible cardiomyopathy, at least documented in her  mother at a young age.  The patient over the course of eight months has  noticed increasing dyspnea on exertion, now to the point where on  minimal exertion she is completely out of breath.  She states in the  last couple of weeks her course has rapidly declined.  She is unable to  sing at church and is consistently losing her voice.  She also stated if  is even painful to laugh and causes shortness of breath.  He has had a  persistent cough also in the last couple months.  She props herself up  with several pillows at night due to dyspnea.  Minimal exertion also  causes chest tightness.  The patient also reports palpitations upon  exertion.  She feels my heart is flying.  She has had no syncope,  however.   ALLERGIES:  PENICILLIN CAUSES RASH.  KEFLEX CAUSES HIVES.  LEVAQUIN  CAUSES HIVES.  CLINDAMYCIN CAUSES ITCHING.  DOXYCYCLINE CAUSES PRURITUS.  IVP DYE CAUSES A RASH.  THERE IS NO HISTORY OF RESPIRATORY DIFFICULTY  WITH IVP DYE.   MEDICATIONS:  1. Fexofenadine 180 mg p.o. daily.  2. Pantoprazole 40 mg p.o. daily.  3. Alprazolam 0.5 mg p.o. every night.   PAST MEDICAL HISTORY:  Also includes:  1. GERD.  2. Esophageal dilatation.  3. History of nonerosive antral gastritis.  4. Chronic psoriasis.   SURGICAL HISTORY:  Includes:  1. History of cholecystectomy in 1996.  2. Left breast biopsy.  3. Ureteral stones.   FAMILY HISTORY:  Noted for father deceased in 61s with throat  cancer.  Mother deceased in her 68s but has a history of cardiomyopathy.  Five  brothers, two sisters with numerous siblings who have asthma.   SOCIAL HISTORY:  The patient used to smoke until 2000 but only  occasionally.  She denies any drug use.  She denies any alcohol use.   REVIEW OF SYSTEMS:  As per HPI.  No nausea or vomiting.  No fever or  chills.  No melena or hematochezia.  No dysuria or frequency.  Positive  for shortness of breath and the dyspnea on minimal exertion, chest pain  as outlined above upon exertion.  No syncope and positive for  palpitations.   PHYSICAL EXAMINATION:  VITAL SIGNS:  Blood pressure 101/69, heart rate  83 beats per minute, weight 211 pounds.  GENERAL:  Obese white female dyspneic on minimal exertion.  NECK EXAM:  Normal carotid stroke.  No carotid bruits.  LUNGS:  Clear breath sounds bilaterally with no wheezing.  HEART:  Regular rate and rhythm with normal S1-S2,  paradoxical split  second heart sound.  Positive S3.  No definite pathological murmurs.  ABDOMEN:  Soft, nontender, but distended.  There is no rebound or  guarding, and there is good bowel sounds.  EXTREMITY EXAM:  Reveals no cyanosis or clubbing.  There is no edema,  and peripheral pulses are intact.  NEUROLOGICAL:  Patient alert and oriented, grossly nonfocal.   PROBLEM LIST:  1. Newly diagnosed with cardiomyopathy (likely dilated      cardiomyopathy).  2. Rule out coronary artery disease.  3. New [place] Heart Association Class 3/4 symptoms.  4. Family history of possible cardiomyopathy.  5. IVP dye allergy.  6. Left bundle-branch block and premature ventricular contractions.  7. Relative hypotension.   PLAN:  1. The patient has a newly diagnosed severely dilated cardiomyopathy,      ejection fraction less than 20%.  I suspect this will be a      nonischemic cardiomyopathy.  The patient does not give an      antecedent history of viral illness.  She also has no history of       hypothyroidism or hyperthyroidism.  There is no family history of      hemochromatosis, and I am not particular suspicious of an      infiltrative cardiac disorder.  2. The patient could have a familial cardiomyopathy.  She will need to      be admitted due to rapidly progressing symptoms of heart failure.      She will be scheduled for left and right heart catheterization on      Monday.  3. We will gently and cautiously start a very low-dose ACE inhibitor      and beta blocker given her relative hypotension.  Interestingly,      also the patient does not particularly appear volume overloaded.      If anything, she may be relatively hypovolemic, and therefore I did      not add a diuretic  4. The patient will need to be seen by the EP service after cardiac      catheterization and likely we would make a decision for early CRTD      in this patient.  The patient does have coronary artery disease.      Further viability evaluation may be in order although I think this      will be distinctly unlikely.   NOTE:  I discussed the risks and benefits of cardiac catheterization  with the patient.  She understands that dye will be administered and she  will be prophylaxed with prednisone and H2  blockers.  The patient can follow up after her initial in-hospital  evaluation and treatment in our [place] office.  It is possible that the  patient even may need intravenous doboumine during the hospitalization.     Learta Codding, MD,FACC  Electronically Signed    GED/MedQ  DD: 04/10/2007  DT: 04/10/2007  Job #: [id number]

## 2010-06-05 NOTE — Assessment & Plan Note (Signed)
[facility]                          [facility]   QUINA, WILBOURNE                     MRN:          [id number]  DATE:06/22/2007                            DOB:          Aug 15, 1945    PRIMARY CARDIOLOGIST:  Dr. Lewayne Bunting.   REASON FOR VISIT:  Scheduled clinic followup.  Please refer to Dr.  Celedonio Savage previous office note of April 8, for full details.   At that time, the patient presented for post hospital followup after  being directly admitted by Dr. Andee Lineman here in the clinic on March 20.  She presented as a new referral with recently diagnosed severe, dilated  cardiomyopathy (EF less than 20%).  Cardiac catheterization indicated  severe, nonischemic cardiomyopathy with normal filling pressures.  EF  was 15-20% with severe, global hypokinesis.   The patient was then referred to Dr. Lewayne Bunting for further evaluation  of nonsustained VT with frequent ventricular ectopy and an underlying  left bundle branch block.  Dr. Ladona Ridgel suggested that the origin of the  ventricular ectopy was very high in the LV septum, raising the question  of bundle branch reentrant beats, commonly seen in this particular  patient subgroup.  He recommended continuation of amiodarone, which was  initiated during that hospitalization.  He also suggested a followup 2-D  echo in 2 to 3 months with consideration for ICD implantation, if LV  function did not improve.  He suggested, in fact, that ICD implantation  would be a strong consideration for this patient, perhaps with cardiac  resynchronization, as well.   During her most recent visit, Dr. Myrtis Ser cut back on the amiodarone from  400 b.i.d. to 400 mg daily.  She did present with bradycardia with a  pulse of 46.  Blood pressure was 105/59.   Prior to amiodarone load, blood work at [facility] revealed a normal  hepatic profile, thyroid function tests, and a baseline chest x-ray.   Clinically, Ms.  Skibinski continues to feel better overall, though she  initially presented with congestive heart failure back in March.  She  also seems to suggest decreased frequency in her palpitations.  She does  have some exertional dyspnea, but otherwise denies PND, orthopnea, or  lower extremity edema.   Of note, the patient also reports a persistent cough which has been  ongoing for the past few months.  This seems to have been exacerbated,  following the addition of lisinopril.   Electrocardiogram today reveals marked sinus bradycardia at 44 bpm with  a chronic left bundle branch block.   CURRENT MEDICATIONS:  1. Amiodarone 200 b.i.d.  2. Aspirin 81 daily.  3. Carvedilol 3.125 b.i.d.  4. Digoxin 0.0625 daily.  5. Lisinopril 2.5 daily.  6. Pantoprazole.   PHYSICAL EXAMINATION:  Blood pressure 101/55, pulse 50, regular weight  204 (down 2).  GENERAL:  A 65 year old female, sitting upright, no distress.  HEENT:  Normocephalic, atraumatic.  NECK:  Palpable carotid pulse without bruits; no JVD at 90 degrees.  LUNGS:  Clear to auscultation all fields.  HEART:  Regular rate and rhythm (S1, S2)  no significant murmurs.  No  rubs or gallops.  ABDOMEN:  Soft, nontender.  Bowel sounds.  EXTREMITIES:  No pedal edema.  NEURO:  No focal deficit.   REVIEW OF SYSTEMS:  The patient denies any recent falls or syncope.  She  does have frequent dizziness.  Otherwise as noted per HPI, remaining  systems negative.   IMPRESSION:  1. Severe nonischemic cardiomyopathy.      a.     Ejection fraction 10-20% by cardiac catheterization.  2. Frequent ventricular ectopy.      a.     Status post nonsustained ventricular tachycardia.      b.     Amiodarone load initiated, March 2009.  3. Relative bradycardia.  4. Relative hypotension.  5. Chronic left bundle branch block.  6. Intermittent dizziness.   PLAN:  1. Down-titrate amiodarone to 200 mg daily.  2. Schedule followup 2-D echo for reassessment of LV  function, to see      if there has been any recovery.  If this remains low, then we will      need to refer the patient back to Dr. Lewayne Bunting for      consideration of ICD plus/minus CRT.  3. The patient has just recently refilled her prescription for      lisinopril.  We discussed the possibility that her persistent,      nonproductive cough may be related to this ACE inhibitor.  We will      consider substituting it with an ARB in the future, pending review      of her 2-D echocardiogram.  4. Schedule return clinic followup with myself and Dr. Andee Lineman in 1      month, for review of echo results and continued clinical      monitoring.      Gene Serpe, PA-C  Electronically Signed      Learta Codding, MD,FACC  Electronically Signed   GS/MedQ  DD: 06/22/2007  DT: 06/22/2007  Job #: [id number]   cc:   Wyvonnia Lora

## 2010-06-05 NOTE — Assessment & Plan Note (Signed)
[facility]                          [facility]   Melissa Paul, Melissa Paul                     MRN:          [id number]  DATE:11/02/2007                            DOB:          07/07/1945    PRIMARY CARE PHYSICIAN:  Dr. Wyvonnia Lora.   HISTORY OF PRESENT ILLNESS:  The patient is a pleasant 65 year old  female with a longstanding history of nonischemic cardiomyopathy.  Her  EF has varied in the past between 10 and 25%.  Currently approximately  15-20%.  Despite maximal medical therapy, she is congestive heart  failure class II B/III.  She has left bundle-branch block and did meet  criteria for CRTD; however, the patient discussed with Dr. Ladona Ridgel and  due to financial reasons, she has declined consideration to proceed with  this.  The patient today presents for followup.  She has been doing  well.  She denies any chest pain.  She does report a lack of energy and  occasional crying spells.  We monitored her thyroid function test on  amiodarone and her TSH had gone up to 11.91, but her free T4 was 0.93.  I told the patient that this could be contributing to her mood disorder,  but at this point in time, I would not start treating her with Synthroid  yet.  We will follow in 6 weeks again her free T4 and TSH and if higher,  certainly a small dose of Synthroid can be initiated.   MEDICATIONS:  1. Carvedilol 3.125 mg p.o. b.i.d.  2. Aspirin 81 mg p.o. daily.  3. Diovan 40 mg p.o. daily.  4. Potassium 10 mg p.o. daily.  5. Furosemide 20 mg p.o. daily.  6. Amiodarone 100 mg p.o. at bedtime.   PHYSICAL EXAMINATION:  VITAL SIGNS:  Blood pressure 110/54, heart rate  50, weight 208.  NECK:  No carotid upstroke.  No carotid bruits.  LUNGS:  Clear breath sounds bilaterally.  HEART:  Regular rate and rhythm.  Normal S1 and S2.  No murmur, rub, or  gallops.  ABDOMEN:  Soft.  EXTREMITIES:  No cyanosis, clubbing, or edema.   PROBLEM LIST:  1.  Nonischemic cardiomyopathy, severe LV dysfunction, ejection      fraction 15-20%, maximum medical therapy.  2. Congestive heart failure class II B/III.  3. Bundle-branch block.  4. Rule out hypothyroidism on amiodarone drug therapy.  5. The patient declined ICD implant.   PLAN:  1. At this point, we will not pursue further ICD implant as the      patient states that she cannot afford this and is not interested.  2. We will follow closely her thyroid function test as the patient in      the future may require Synthroid replacement.     Learta Codding, MD,FACC  Electronically Signed    GED/MedQ  DD: 11/02/2007  DT: 11/03/2007  Job #: [id number]   cc:   Cathey Endow. Ladona Ridgel, MD

## 2010-06-05 NOTE — Assessment & Plan Note (Signed)
[facility]                          [facility]   JENAY, MORICI                     MRN:          [id number]  DATE:09/29/2007                            DOB:          1946/01/22    CARDIOLOGIST:  Learta Codding, MD, [facility]   PRIMARY CARE PHYSICIAN:  Dr. Wyvonnia Lora.   REASON FOR VISIT:  A 6-week followup.   HISTORY OF PRESENT ILLNESS:  [name] is a very pleasant 65 year old  female patient with non-ischemic cardiomyopathy with an EF as low as 10-  20% in the past.  Recent echocardiogram done in June 2009 revealed an EF  of 20-25%.  She has been referred to Dr. Ladona Ridgel for consideration of  CRT/ICD implantation.  The patient was supposed to see him prior to this  visit, but had to reschedule.  She now is to see him in early October  2009.  Of note, she had a cardiac catheterization in March 2009 that  revealed normal coronary arteries.  She was placed on amiodarone for  nonsustained ventricular tachycardia.  She has been maintained on this  medication and has had her dose decreased over time.  She last saw Dr.  Diona Browner in the office on August 11, 2007.  He switched her ACE inhibitor  to an ARB secondary to cough.  She returns today for followup.  She does  note that her shortness of breath has increased over the last week to 2  weeks.  She describes NYHA Class III symptoms.  She sleeps on 3 pillows.  This is chronic without change.  She denies PND.  She denies pedal  edema.  She does note chest tightness with her shortness of breath.  She  also notes difficulty with sleeping as well as decreased appetite.  She  denies fevers, chills, or cough.  She does have occasional headache.  She denies dysuria or hematuria.   CURRENT MEDICATIONS:  Digoxin 0.25 mg half a tablet daily, Carvedilol  3.125 b.i.d., Aspirin 81 mg daily, Amiodarone 200 mg daily, Diovan 40 mg  daily, Potassium 10 mEq daily  Furosemide 20 mg daily, Alprazolam  p.r.n.   ALLERGIES:  PENICILLIN, KEFLEX, LEVAQUIN, CLINDAMYCIN, DOXYCYCLINE, and  IV DYE.   PHYSICAL EXAMINATION:  GENERAL:  She is a well-nourished, well-developed  female in no acute distress.  VITAL SIGNS:  Blood pressure is 109/65, pulse 51, and weight 208 pounds,  this has gone up from 204 to 206 to 208 today over the last several  weeks.  HEENT:  Normal.  NECK:  Without JVD.  CARDIAC:  Normal S1 and S2. Regular rate and rhythm.  No gallops.  LUNGS:  Clear to auscultation bilaterally.  ABDOMEN:  Soft and nontender.  EXTREMITIES:  Without edema.  NEUROLOGIC:  She is alert and oriented x3. Cranial nerves II-XII are  grossly intact.   Electrocardiogram shows sinus rhythm with a heart rate of 47, left axis  deviation, left bundle-branch block.   ASSESSMENT AND PLAN:  1. Nonischemic cardiomyopathy with an ejection fraction of 20-25% with      chronic systolic congestive heart failure.  She typically describes      New York Heart Association Class II symptoms and is now describing      New York Heart Association Class III symptoms.  She does not seem      to be volume overloaded on exam; however, she notes that she has      not taken her Lasix for the last couple of days and she also notes      increasing abdominal girth.  This seems to be a typical symptom      with increasing fluid status in her.  I have therefore recommended      that we increase her Lasix to 40 mg a day as well as potassium to      20 mEq a day for the next 3 days.  After that, she is to resume her      usual dose.  She knows to continue to monitor weights.  If she has      improvement in her symptoms with the increased Lasix and notes      decline in her status after going back to her usual dose, she      should contact us.  At that point, I suspect we will place her on a      higher dose, chronically.  We will check a BMET and a BNP level      today.  She will have followup blood work in a month.  2.  History of nonsustained ventricular tachycardia, on amiodarone      therapy.  The patient, when she has blood work checked in 1 week,      will get a CMET and a TSH to follow up on her amiodarone.  3. Bradycardia.  The patient does note some lethargy.  I question      whether or not this may be contributing.  We will discontinue her      digoxin today to see if this helps.   DISPOSITION:  The patient will be brought back in followup with Dr.  Andee Lineman in the next 3-4 weeks.  As noted above, she will see Dr. Ladona Ridgel  on October 26, 2007 for consideration of CRT/AICD implantation.      Tereso Newcomer, PA-C  Electronically Signed      Madolyn Frieze. Jens Som, MD, [facility]  Electronically Signed   SW/MedQ  DD: 09/29/2007  DT: 09/30/2007  Job #: [id number]   cc:   Wyvonnia Lora

## 2010-06-05 NOTE — Discharge Summary (Signed)
NAME:  Melissa Paul, Melissa Paul NO.:  [id number]   MEDICAL RECORD NO.:  [id number]          PATIENT TYPE:  INP   LOCATION:  4713                         FACILITY:  [facility]   PHYSICIAN:  Luis Abed, MD, FACCDATE OF BIRTH:  December 31, 1945   DATE OF ADMISSION:  04/10/2007  DATE OF DISCHARGE:  04/19/2007                               DISCHARGE SUMMARY   PRIMARY CARDIOLOGIST:  Dr. Learta Codding   PRIMARY CARE:  Dr. Wyvonnia Lora   EP:  Dr. Sherryl Manges.   DISCHARGING DIAGNOSES:  1. Congestive heart failure secondary to nonischemic cardiomyopathy      with ejection fraction of 15%.  2. Nonsustained ventricular tachycardia status post EP evaluation this      admission.  3. Left bundle-branch block.  4. Urinary tract infection, resolved at Melissa time of discharge.  5. Status post cardiac catheterization this admission showing severe      nonischemic cardiomyopathy with normal filling pressures and normal      outputs, no evidence of coronary artery disease.  6. Relative intolerance of heart failure medicines due to orthostatic      hypotension requiring slow titration of medication therapy.   PAST MEDICAL HISTORY:  1. GERD/esophageal dilatation/gastritis.  2. Chronic psoriasis.  3. Multiple allergies, including IVP dye.  4. Cholecystectomy.  5. Urethral stones.  6. Left breast biopsy.   HISTORY OF PRESENT ILLNESS:  Melissa Paul is a very pleasant 65 year old  Caucasian female seen by Dr. Andee Lineman at [facility] on April 10, 2007, for increased shortness of breath with past medical history as  stated above.  Dr. Andee Lineman felt Melissa patient needed further evaluation.  Cardiac catheterization was recommended.  Melissa patient was transferred to  [facility] for workup, underwent cardiac catheterization on April 13, 2007.  Melissa patient was noted to have frequent PVCs, bigeminy.  EP was  asked to consult secondary to increased ventricular ectopy.  Melissa patient  with nonsustained V-TACH  evaluated by Dr. Lewayne Bunting.  He felt her PVC  morphology suggests that Melissa ectopy has originated high in Melissa LV septum  region, Melissa question of bundle-branch re-entry beats which are commonly  seen in patients with conduction disease, and nonischemic  cardiomyopathy.  He felt medical therapy for heart failure and  aggressive management of her ectopic beats with initiation of amiodarone  therapy, continuation of her ACE inhibitor, beta-blocker and digoxin and  then follow up in 2-3 months outpatient for repeat echocardiogram,  ultimately, ICD implantation will be a strong consideration for this  patient.  Melissa Paul tolerated amiodarone loading.  Heart rate has  remained in Melissa 50s and 60s.  Digoxin cut back to 0.125 mg half a tablet  daily.  Melissa patient completed Macrobid for treatment of urinary tract  infection, up ambulating in Melissa hall without lightheadedness or  dizziness, even with a blood pressure 90 to 100 systolic and 50 to 60  diastolic with a heart rate in Melissa 50s.  Melissa patient still continued to  have frequent PVCs, but asymptomatic, being discharged home on day of  discharge after  being evaluated by Dr. Myrtis Ser.  Melissa patient is afebrile,  weight 98 kg at Melissa time of discharge, heart rate 58,  blood pressure  100/60, sat 97% on room air.   PHYSICAL EXAM:  Unremarkable at this time.   MEDICATIONS:  1. Amiodarone has been decreased to 400 mg b.i.d.  2. Carvedilol 3.125 b.i.d.  3. Aspirin 81 daily.  4. Digoxin 0.125 mg, Melissa patient is to take half a tablet daily.  5. Lisinopril 2.5 mg daily.  6. Further titration of medications outpatient per Dr. Andee Lineman.  7. Melissa patient is to continue her previous medications as prescribed      including her alprazolam, Protonix, and Allegra.   She will follow up with Dr. Andee Lineman in Melissa [employer] office.  Office will call  Melissa patient with scheduled appointment.  Melissa Paul has been given Melissa  congestive heart failure discharge instructions and  her prescriptions  and knows to seek medical assistance if her symptoms worsen or persist.   Duration of discharge encounter greater than 30 minutes.      Dorian Pod, ACNP      Luis Abed, MD, [facility]  Electronically Signed    MB/MEDQ  D:  04/19/2007  T:  04/20/2007  Job:  [id number]   cc:   Wyvonnia Lora

## 2010-06-05 NOTE — Assessment & Plan Note (Signed)
[facility]                          [facility]   Melissa Paul, Melissa Paul                     MRN:          [id number]  DATE:05/10/2008                            DOB:          [birth year]    REFERRING PHYSICIAN:  Wyvonnia Lora   HISTORY OF PRESENT ILLNESS:  The patient is a pleasant 65 year old  female with a history of nonischemic cardiomyopathy.  The patient is  currently in NYHA class IIB.  She has been recently diagnosed with  pneumonia on April 29, 2008, and is on Avelox therapy.  She has a cough  and has pleuritic chest pain.  However, she has no symptoms of worsening  heart failure.  Her weight is stable and she has no peripheral edema.  She denies any palpitations or syncope.  Her thyroid function test  worsened in the interim.  The patient was taken off amiodarone by Dr.  Margo Common and placed on Synthroid, a change which I was going to make in  January, but the patient did not come to the office at that time.   Medications include:  1. Carvedilol 3.125 mg p.o. b.i.d.  2. Aspirin 81 mg p.o. daily.  3. Diovan 40 mg p.o. daily.  4. Potassium 10 mg p.o. daily.  5. Furosemide 20 mg p.o. daily.  6. Levothyroxine 50 mcg p.o. daily.  7. Avelox daily x10 days.  8. Benadryl prior and post Avelox therapy given her multiple drug      allergies.   PHYSICAL EXAMINATION:  VITAL SIGNS:  Blood pressure 116/56, heart rate  65, weight 216 pounds.  HEENT:  Pupils anisocoric.  Conjunctivae clear.  NECK:  Supple, normal carotid stroke, and no carotid bruits.  LUNGS:  Clear breath sounds bilaterally.  HEART:  Regular rate and rhythm with normal S1 and S2 with no murmur,  rubs, or gallops.  ABDOMEN:  Soft, nontender.  No rebound or guarding.  Good bowel sounds.  EXTREMITIES:  No cyanosis, clubbing, or edema.   PROBLEMS:  1. Nonischemic cardiomyopathy, ejection fraction 20%.  2. Congestive heart failure class IIB/III stable.  3.  Community-acquired pneumonia on Avelox.  4. Hypothyroidism.      a.     Amiodarone discontinued      b.     Synthroid supplementation.  5. Implantable cardioverter-defibrillator declined by the patient.   PLAN:  1. From a heart standpoint, the patient appears euvolemic.  She has      had no heart failure exacerbation.  2. I will make no changes in medical regimen currently, but on her      next visit after she has recovered from her pneumonia we could      certainly increase her carvedilol to 6.25 mg p.o. b.i.d.  3. The patient will follow up with [place] in 6 months.     Melissa Codding, MD,FACC  Electronically Signed    GED/MedQ  DD: 01/22/1999  DT: 05/11/2008  Job #: [id number]   cc:   Wyvonnia Lora

## 2010-06-05 NOTE — Assessment & Plan Note (Signed)
[facility]                          [facility]   Melissa Paul                     MRN:          [id number]  DATE:08/11/2007                            DOB:          August 02, 1945    PRIMARY CARDIOLOGIST:  Learta Codding, MD,FACC   REASON FOR VISIT:  Followup echocardiogram.   HISTORY OF PRESENT ILLNESS:  I am seeing Melissa Paul in Dr. Margarita Mail  absence.  She was evaluated last in June 2009.  Her history is well  detailed in the previous note and includes a nonischemic dilated  cardiomyopathy with ejection fraction previously assessed at 10-20%, no  significant obstructive coronary artery disease, and ventricular ectopy  including nonsustained ventricular tachycardia, now on amiodarone.  She  has a chronic left bundle-branch block pattern and has already been seen  by Dr. Ladona Ridgel regarding possibility of a defibrillator and  resynchronization therapy.  She has been on a reasonable medical  regiment since March 2009 and had a followup echocardiogram done in June  demonstrating a left ventricular ejection fraction of 20-25% with mildly  decreased right ventricular function, mild tricuspid regurgitation, mild  mitral regurgitation and dilated inferior vena cava.  She is reporting  NYHA class II, dyspnea on exertion, and fatigue.  She also experiences a  general fullness in her chest, but reports that her palpitations in  general have been better and she has not had any dizziness or syncope.  She does have a chronic cough on lisinopril, nonproductive, and not  associated with any obvious fevers or chills.  She is also reporting  some problems with lower extremity edema that has been increasing over  the last several weeks.  Her weight is up 2 pounds compared to the prior  visit.   Allergies are multiple and include PENICILLIN, KEFLEX, LEVAQUIN,  CLINDAMYCIN, DOXYCYCLINE, and INTRAVENOUS CONTRAST.   Present medications include:  1. Protonix 40 mg p.o. daily.  2. Lisinopril 2.5 mg p.o. daily.  3. Digoxin 0.125 mg one-half tablet p.o. daily.  4. Carvedilol 3.125 mg p.o. b.i.d.  5. Aspirin 81 mg p.o. daily.  6. Amiodarone 200 mg p.o. daily.  7. Xanax 0.5 mg p.r.n.   REVIEW OF SYSTEMS:  As outlined above.  Otherwise negative.   PHYSICAL EXAMINATION:  VITAL SIGNS:  Blood pressure is 114/64, heart  rate is 53 and regular, and weight is 206.2 pounds.  GENERAL:  This is an overweight woman in no acute distress.  HEENT:  Conjunctivae and eyelids are normal.  Oropharynx is clear.  NECK:  Supple.  No elevated jugular venous pressure.  No loud bruits.  No thyromegaly is noted.  LUNGS:  Clear.  Diminished breath sounds at the bases.  No rales.  CARDIAC:  Regular rate and rhythm.  No S3 or gallop.  Diminished heart  sounds.  ABDOMEN:  Soft and nontender.  EXTREMITIES:  Exhibit trace to 1+ edema, small spider veins.  Distal  pulses are diminished at 1+.  SKIN:  Warm and dry.  MUSCULOSKELETAL:  No kyphosis noted.  NEUROPSYCHIATRIC:  The patient is alert and oriented x3.  Affect is  appropriate.   IMPRESSION AND RECOMMENDATIONS:  Nonischemic cardiomyopathy with  ejection fraction recently assessed at 20-25% on medical therapy.  She  is experiencing a chronic cough, which may well be related to her  lisinopril.  This will therefore be changed to an angiotensin receptor  blocker covered by her prescription plan.  We will call [facility] to  determine, which specific medication will be used.  We also plan to  initiate Lasix at 20 mg daily with a low-dose potassium supplement at 10  mg daily.  A BMET will be checked in one weeks' time to followup on  renal function and potassium.  She will continue the remainder of her  medications and we will make a followup visit with Dr. Ladona Ridgel in  [place] to discuss the merits of a defibrillator and potentially  resynchronization therapy in this setting given the fact that her   ejection fraction has not improved substantially on medications.  Otherwise, we will have her return to clinic to see Dr. Andee Lineman over the  next 6 weeks.     [name], MD  Electronically Signed    SGM/MedQ  DD: 08/11/2007  DT: 08/12/2007  Job #: [id number]   cc:   [name]. Ladona Ridgel, MD  Wyvonnia Lora

## 2010-06-05 NOTE — Assessment & Plan Note (Signed)
[facility]                          [facility] OFFICE NOTE   Melissa Paul, Melissa Paul                     MRN:          [id number]  DATE:04/29/2007                            DOB:          [birth year]    Ms. Waddell is seen post hospitalization.  She had seen Dr. Andee Lineman in the  office on March 20 and he felt that she had a newly-diagnosed severe  cardiomyopathy and that she should have a right and left heart  catheterization.  While at the hospital this was done.  Her  catheterization revealed that she had no coronary artery disease.  She  had severe LV dysfunction.  She had frequent PVCs.  She was stable in  the hospital.  She did have some nonsustained ventricular tachycardia  and was seen by EP.  She also has left bundle branch block.  She  continued to show some relative intolerance to heart failure medications  with orthostasis.  She was seen by EP.  Dr. Ladona Ridgel felt that her PVC  morphology suggested that the ectopy was high in the LV septum.  He  questioned some possible bundle branch block reentry beats.  He  recommended aggressive treatment of her heart failure and the initiation  of amiodarone with follow-up echo in 2-3 months and consideration of ICD  at that time.  She was sent home on amiodarone.  Her heart rate was  relatively slow.  Her digoxin dose was cut back.  Her blood pressure was  low.  However, she was stable and sent home.  I am seeing her in the  office today in Dr. Margarita Mail absence.   PAST MEDICAL HISTORY:  Allergies:  PENICILLIN, KEFLEX, LEVAQUIN,  CLINDAMYCIN, DOXYCYCLINE and IV DYE.   Medications:  1. Pantoprazole.  2. Amiodarone 400 mg b.i.d..  This continued her hospital load.  It is      now time to lower her to 400 mg once a day and I instructed the      patient to do this.  3. Lisinopril 2.5 mg daily.  4. Digoxin 0.0625 NG daily.  5. Carvedilol 3.125 mg b.i.d.  6. Aspirin 81 mg.   Other medical problems:  See  the list below.   REVIEW OF SYSTEMS:  She actually is feeling well.  She is limited by her  shortness of breath at this time.  She is not having any PND or  orthopnea.  Otherwise her review of systems is negative.   PHYSICAL EXAM:  Blood pressure is 105/59 with a pulse of 46.  Weight is  206 pounds.  O2 saturation is 99% on room air.  The patient is here with a family member.  The patient is oriented to  person, time and place.  Affect is normal.  HEENT:  No xanthelasma.  She has normal extraocular motion.  There are no carotid bruits.  There is no jugular venous distention.  LUNGS:  Clear.  Respiratory effort is not labored.  CARDIAC:  An S1 with an S2.  There are no clicks or significant murmurs.  ABDOMEN:  Obese.  There is no  peripheral edema.   PROBLEMS:  1. Gastroesophageal reflux disease.  2. History of esophageal dilatation and also history of nonerosive      antral gastritis takes.  3. Chronic psoriasis.  4. Status post cholecystectomy.  5. Ureteral stones.  6. Nonischemic cardiomyopathy with no evidence of coronary disease.  I      would like to try to titrate her medicines higher.  Unfortunately,      she is having some dizziness and she is bradycardic and I am      hesitant to push her medicines any higher.  7. Some nonsustained ventricular tachycardia in the hospital.      Amiodarone is to be reduced from 400 mg b.i.d. to 400 mg daily.   She will see Dr. Andee Lineman back in few weeks.     Luis Abed, MD, [facility]  Electronically Signed    JDK/MedQ  DD: 04/29/2007  DT: 04/29/2007  Job #: [id number]

## 2010-06-08 NOTE — Op Note (Signed)
NAME:  Melissa, Paul                        ACCOUNT NO.:  [id number]   MEDICAL RECORD NO.:  [id number]                   PATIENT TYPE:  AMB   LOCATION:  DAY                                  FACILITY:  [facility]   PHYSICIAN:  Lionel December, M.D.                 DATE OF BIRTH:  December 04, 1945   DATE OF PROCEDURE:  09/20/2003  DATE OF DISCHARGE:                                 OPERATIVE REPORT   PROCEDURE:  Esophagogastroduodenoscopy followed with esophageal dilatation.   INDICATION:  Regine is a 65 year old, Caucasian female with recurrent  epigastric pain, poorly-controlled symptoms, GERD, and dysphagia.  Recent  ultrasound of upper abdomen was negative for dilated bile duct or pancreatic  abnormality.  She was switched from Aciphex to Nexium and notices improved  control of her GERD symptoms.  She is undergoing diagnostic/therapeutic  procedure.  Procedure risks were reviewed with the patient.  Informed  consent was obtained.   PREOPERATIVE MEDICATIONS:  1.  Cetacaine spray for pharyngeal topical anesthesia.  2.  Demerol 50 mg IV.  3.  Versed 8 mg IV in divided dose.   FINDINGS:  Procedure performed in endoscopy suite.  The patient's vital  signs and O2 saturations were monitored during the procedure and remained  stable.  Patient was placed left lateral decubitus position, and Olympus  video scope was passed via oropharynx without any difficulty into esophagus.   ESOPHAGUS:  Mucosa of the esophagus was normal throughout.  Squamocolumnar  junction was wavy with a small sliding hiatal hernia.   STOMACH:  It was empty and distended very well with insufflation.  Folds of  proximal stomach were normal.  Examination of the mucosa revealed patchy  erythema and granularity of antrum with mucosal granularity but no erosions  or ulcers noted.  Pyloric channel was patent.  Angularis, fundus, and cardia  were examined by retroflexion of scope and were normal.   DUODENUM:  Bulbar mucosa was  normal.  The scope was passed in the second  part of the duodenal mucosa, and folds were normal.  Endoscope was  withdrawn.   Esophagus was dilated by passing 56 [place] Maloney dilator to full  insertion.  As the dilator was withdrawn, endoscope was passed again and  esophageal mucosa reexamined.  There was a linear tear just below UES  indicative of esophageal wrap.  Pictures taken for the record.  Endoscope  was withdrawn.  The patient tolerated the procedure well.   FINAL DIAGNOSES:  1.  Small sliding hiatal hernia.  2.  Nonerosive antral gastritis.  3.  Esophagus dilated by passing 56 [place] Maloney dilator, resulting in      small tear at cervical esophagus indicative of esophageal wrap which was      not initially seen.   RECOMMENDATIONS:  1.  She will continue antireflux measures and Nexium at 40 mg p.o. q.a.m.  2.  H. pylori serology will be checked  today.      ___________________________________________                                            Lionel December, M.D.   NR/MEDQ  D:  09/20/2003  T:  09/20/2003  Job:  [id number]   cc:   Wyvonnia Lora  [address]  [place]  [place] [address]  Fax: [phone]

## 2010-06-08 NOTE — Consult Note (Signed)
NAME:  Melissa Paul, Melissa Paul NO.:  [id number]   MEDICAL RECORD NO.:  [id number]                   PATIENT TYPE:   LOCATION:                                       FACILITY:   PHYSICIAN:  Lionel December, M.D.                 DATE OF BIRTH:  November 18, 1945   DATE OF CONSULTATION:  08/22/2003  DATE OF DISCHARGE:                                   CONSULTATION   PRESENTING COMPLAINT:  Epigastric pain, poorly controlled symptoms of  gastroesophageal reflux disease.   HISTORY OF PRESENT ILLNESS:  Melissa Paul is a 65 year old Caucasian female who  is referred through courtesy of Dr. Margo Common for GI evaluation.  She has a  several-year history of GERD.  She was last seen by me in October of 1999  when she was also having dysphagia.  She had an EGD which revealed small  sliding hiatal hernia but no ring or stricture formation and she responded  to esophageal dilation.  She was maintained on Prevacid.  She states she did  well until a couple of months ago when she began to have epigastric pain,  along with frequent burping.  She has noted trouble swallowing but points to  her throat, and she also has intermittent regurgitation, feels as if hot  water comes up in her throat.  She has had nausea without vomiting.  She was  seen by Dr. Margo Common 3 months ago.  She was switched over to Aciphex.  Appointment was made for [place] to see her on Jun 15, 2003 but she had to leave  town and could not come until now.  She is feeling somewhat better with  Aciphex twice a day but not like she used to be.  She also complains of bad  taste in her mouth.  She has a feeling of tightness in her upper abdomen and  chest.  This may go on for a few hours.  She also has noticed anorexia and  weight loss.  She states she has lost 16 pounds in the last 2 months.  However she weighed 217 pounds 3 months ago in Dr. Jackolyn Confer office and now  she is 212.  The patient tells me that she had actually gained some  weight  since that visit.  She is also having anxiety attacks, but more during the  day than nighttime.  She feels Xanax current dose is not working.  She does  not feel that Reglan has anything to do with her symptoms since she has been  on it for awhile.  She had TSH done a few months ago by Dr. Margo Common which was  normal.   CURRENT MEDICATIONS:  She is on Aciphex  20 mg b.i.d., Metoclopramide 10 mg  before breakfast and evening meal, Allegra 180 mg daily, calcium and vitamin  D daily, Xanax 0.25 mg b.i.d., MiraLax 17 grams daily, Tylenol p.r.n.   She  states since she has been on MiraLax her bowels have been moving every  other day.  She feels she is having good response to therapy.   PAST MEDICAL HISTORY:  Chronic GERD as above.  History of anxiety and  osteoarthrosis.  She had right oophorectomy with incidental appendectomy in  1980s.  Prior to that she had a hysterectomy in 1971.  She had  cholecystectomy about 19 years ago for symptomatic cholelithiasis.  She had  colonoscopy within the last 3-4 years ago and this reportedly was normal.   ALLERGIES:  PENICILLIN, KEFLEX, IODINE DYE.   SOCIAL HISTORY:  She is married.  She has 3 children and they all have  stomach trouble.  She does not work.  She smoked cigarettes off and on for  years but quit in March 2003.  She does not drink alcohol.   FAMILY HISTORY:  Mother lived to be 70.  Father died of stomach cancer and  renal failure at age 44.  She has 2 sisters and 4 brothers.  Two sisters and  1 brother are diabetic.   PHYSICAL EXAMINATION:  Pleasant well-developed mildly-obese Caucasian female  who is in no acute distress.  She weighs 212 pounds.  She is 5 feet 4 inches  tall.  Pulse 80 per minute, blood pressure 140/90.  She is afebrile.  Conjunctiva is pink.  She is nonicteric.  Oropharyngeal mucosa is normal.  Dentition satisfactory condition.  NECK:  Without masses or thyromegaly.  CARDIAC:  Regular rhythm, normal S1 and S2.   No murmur, rub or gallop noted.  LUNGS:  Clear to auscultation.  ABDOMEN:  Full but symmetrical.  Bowel sounds are normal.  No bruits noted.  Palpitation reveals mild tenderness in epigastrium and below the right  costal margin but no organomegaly or masses noted.  Rectal examination  reveals guaiac negative stool.  EXTREMITIES:  No peripheral edema or clubbing noted.   ASSESSMENT:  Melissa Paul is a 65 year old Caucasian female with chronic GERD who  is presently on double dose PPI and low dose promotility agent but remains  symptomatic, with frequent burping, regurgitation, nausea, who also has  epigastric pain, anorexia and some weight loss.  She is also having anxiety  attacks which may or may not have anything to do with her symptoms.  I am  not convinced that these symptoms are due to reflux.  I need to make sure  that she does not have biliary or pancreatic disease prior to considering  EGD.  She may also need to have her esophagus re dilated since she is having  some dysphagia.   RECOMMENDATIONS:  1. Will schedule her for upper abdominal ultrasound.  Would have preferred     CT but she is allergic to iodine dye and therefore would go that route.  2. Will try her on Nexium at 40 mg p.o. q.a.m. instead of Aciphex.  Samples     given.  3. CBC and Chem 20 along with upper abdominal ultrasound.   If above studies are normal, we will proceed with EGD and possibly ED.   We would like to thank Dr. Margo Common for the opportunity to participate in the  care of this nice lady.      ___________________________________________                                            Ok Anis  Karilyn Cota, M.D.   NR/MEDQ  D:  08/22/2003  T:  08/22/2003  Job:  [id number]   cc:   Wyvonnia Lora  [address]  [place]  [place] [address]  Fax: [phone]

## 2010-07-03 ENCOUNTER — Encounter: Payer: Self-pay | Admitting: Cardiology

## 2010-07-20 ENCOUNTER — Ambulatory Visit: Payer: Medicare Other | Admitting: Cardiology

## 2010-07-20 ENCOUNTER — Encounter: Payer: Self-pay | Admitting: Internal Medicine

## 2010-07-20 ENCOUNTER — Encounter: Payer: Self-pay | Admitting: *Deleted

## 2010-07-20 ENCOUNTER — Ambulatory Visit (INDEPENDENT_AMBULATORY_CARE_PROVIDER_SITE_OTHER): Payer: Medicare Other | Admitting: Internal Medicine

## 2010-07-20 DIAGNOSIS — I429 Cardiomyopathy, unspecified: Secondary | ICD-10-CM

## 2010-07-20 DIAGNOSIS — I428 Other cardiomyopathies: Secondary | ICD-10-CM

## 2010-07-20 DIAGNOSIS — R0602 Shortness of breath: Secondary | ICD-10-CM

## 2010-07-20 DIAGNOSIS — I5022 Chronic systolic (congestive) heart failure: Secondary | ICD-10-CM

## 2010-07-20 DIAGNOSIS — R42 Dizziness and giddiness: Secondary | ICD-10-CM

## 2010-07-20 LAB — ICD DEVICE OBSERVATION
AL AMPLITUDE: 0.3 mv
AL IMPEDENCE ICD: 12.5 Ohm
AL THRESHOLD: 2.25 V
ATRIAL PACING ICD: 74 pct
BAMS-0001: 150 {beats}/min
BAMS-0003: 90 {beats}/min
CHARGE TIME: 9 s
DEVICE MODEL ICD: 794739
FVT: 0
HV IMPEDENCE: 80 Ohm
LV LEAD IMPEDENCE ICD: 525 Ohm
LV LEAD THRESHOLD: 0.5 V
MODE SWITCH EPISODES: 6891
PACEART VT: 0
RV LEAD AMPLITUDE: 12 mv
RV LEAD IMPEDENCE ICD: 525 Ohm
RV LEAD THRESHOLD: 0.75 V
TOT-0006: 20110912000000
TOT-0007: 1
TOT-0008: 0
TOT-0009: 1
TOT-0010: 4
TZON-0003SLOWVT: 340 ms
TZON-0004SLOWVT: 20
TZON-0005SLOWVT: 6
TZON-0010SLOWVT: 40 ms
VENTRICULAR PACING ICD: 85 pct
VF: 0

## 2010-07-20 NOTE — Assessment & Plan Note (Signed)
Likely due to hypotension/ dehydration Lasix is on hold for 3 days, then decrease to 40mg  daily.

## 2010-07-20 NOTE — Assessment & Plan Note (Signed)
She appears dehydrated today I have instructed her to hold lasix x 3 days and then decrease lasix to 40mg  daily.  Her BiV ICD is interrogated today.  Her atrial lead is no longer functioning (likely insulation breach).  She has lost AV synchrony which may be causing her SOB.  I have reprogrammed the device with increased RA lead pacing output to promote AV synchrony.  We also discussed risks, benefits, alternatives to lead revision at length today.  She understands risks and wishes to proceed.  I have scheduled RA lead revision for 07/31/10 at 9:30 am.

## 2010-07-20 NOTE — Progress Notes (Signed)
The patient presents today for routine electrophysiology followup.  Since last being seen in our clinic, the patient reports progressive SOB and decreased exercise tolerance.  She reports progressive orthostatic dizziness and fatigue over the past few weeks.  She has found her SBP 80s at home.  Today, she denies symptoms of palpitations, chest pain, orthopnea, PND, lower extremity edema,  presyncope, syncope, or neurologic sequela.  The patient feels that she is tolerating medications without difficulties and is otherwise without complaint today.   Past Medical History  Diagnosis Date  . Nonischemic cardiomyopathy     EF 25-30% by echo 06/2009  . CHF (congestive heart failure)     NYHA Class III  . LBBB (left bundle branch block)   . Hypothyroidism   . UTI (urinary tract infection)     Recurrent  . Glucose intolerance (pre-diabetes)   . Hyperlipemia   . Mitral regurgitation     Mild to Moderate   Past Surgical History  Procedure Date  . Cholecystectomy   . Total abdominal hysterectomy     (R) ovary removed  . Appendectomy   . Knee arthroscopy     Left  . Cardiac defibrillator placement 09/2009    St. Jude BiV ICD by Dr. Shanna Cisco class III    Current Outpatient Prescriptions  Medication Sig Dispense Refill  . aspirin 81 MG tablet Take 1 tablet by mouth daily.      . carvedilol (COREG) 6.25 MG tablet TAKE ONE TABLET BY MOUTH TWICE DAILY  60 tablet  6  . cetirizine (ZYRTEC) 10 MG tablet Take 1-2 tablets by mouth daily.       . diazepam (VALIUM) 2 MG tablet Take 1 tablet by mouth three times per day.        . digoxin (LANOXIN) 0.125 MG tablet Take 1 tablet by mouth daily.       . furosemide (LASIX) 80 MG tablet Take 1 tablet by mouth daily.         Marland Kitchen levothyroxine (SYNTHROID, LEVOTHROID) 75 MCG tablet Take 1/2 tablet by mouth daily.        Marland Kitchen losartan (COZAAR) 25 MG tablet Take 1 tablet by mouth daily.       . polyethylene glycol (MIRALAX / GLYCOLAX) packet Use daily as  directed.       . ranitidine (ZANTAC) 150 MG capsule Take 1 tablet by mouth daily.       Marland Kitchen spironolactone (ALDACTONE) 25 MG tablet Take 1 tablet by mouth daily.         Allergies  Allergen Reactions  . Cephalexin   . Clindamycin   . Latex   . Levofloxacin   . Penicillins     History   Social History  . Marital Status: Married    Spouse Name: N/A    Number of Children: N/A  . Years of Education: N/A   Occupational History  . Not on file.   Social History Main Topics  . Smoking status: Former Smoker    Quit date: 01/22/1996  . Smokeless tobacco: Not on file  . Alcohol Use: No  . Drug Use: No  . Sexually Active: Not on file   Other Topics Concern  . Not on file   Social History Narrative  . No narrative on file    Family History  Problem Relation Age of Onset  . Diabetes Neg Hx   . Hypertension Neg Hx   . Coronary artery disease Neg Hx     ROS-  All systems are reviewed and are negative except as outlined in the HPI above    Physical Exam: Filed Vitals:   07/20/10 1431  BP: 88/62  Pulse: 88  Resp: 18  SpO2: 96%    GEN- The patient is overweight appearing, alert and oriented x 3 today.   Head- normocephalic, atraumatic Eyes-  Sclera clear, conjunctiva pink Ears- hearing intact Oropharynx- clear Neck- supple, no JVP Lymph- no cervical lymphadenopathy Lungs- Clear to ausculation bilaterally, normal work of breathing Chest- ICD pocket is well healed Heart- Regular rate and rhythm, no murmurs, rubs or gallops, PMI not laterally displaced GI- soft, NT, ND, + BS Extremities- no clubbing, cyanosis, or edema MS- no significant deformity or atrophy Skin- no rash or lesion Psych- euthymic mood, full affect Neuro- strength and sensation are intact  ICD interrogation- reviewed in detail today,  See PACEART report  Assessment and Plan:

## 2010-07-20 NOTE — Assessment & Plan Note (Signed)
Worsened by AV dyssynchony Lead revision as above.

## 2010-07-24 ENCOUNTER — Encounter: Payer: Self-pay | Admitting: Cardiology

## 2010-07-24 ENCOUNTER — Encounter: Payer: Self-pay | Admitting: Internal Medicine

## 2010-07-24 LAB — PROTIME-INR

## 2010-07-31 ENCOUNTER — Ambulatory Visit (HOSPITAL_COMMUNITY)
Admission: RE | Admit: 2010-07-31 | Discharge: 2010-08-01 | Disposition: A | Discharge status: Home / Self Care | Payer: Medicare Other | Source: Ambulatory Visit | Attending: Internal Medicine | Admitting: Internal Medicine

## 2010-07-31 DIAGNOSIS — Z79899 Other long term (current) drug therapy: Secondary | ICD-10-CM | POA: Insufficient documentation

## 2010-07-31 DIAGNOSIS — Z01810 Encounter for preprocedural cardiovascular examination: Secondary | ICD-10-CM | POA: Insufficient documentation

## 2010-07-31 DIAGNOSIS — Z7982 Long term (current) use of aspirin: Secondary | ICD-10-CM | POA: Insufficient documentation

## 2010-07-31 DIAGNOSIS — T82198A Other mechanical complication of other cardiac electronic device, initial encounter: Secondary | ICD-10-CM | POA: Insufficient documentation

## 2010-07-31 DIAGNOSIS — Y831 Surgical operation with implant of artificial internal device as the cause of abnormal reaction of the patient, or of later complication, without mention of misadventure at the time of the procedure: Secondary | ICD-10-CM | POA: Insufficient documentation

## 2010-07-31 DIAGNOSIS — E119 Type 2 diabetes mellitus without complications: Secondary | ICD-10-CM | POA: Insufficient documentation

## 2010-07-31 DIAGNOSIS — I428 Other cardiomyopathies: Secondary | ICD-10-CM

## 2010-07-31 DIAGNOSIS — I447 Left bundle-branch block, unspecified: Secondary | ICD-10-CM | POA: Insufficient documentation

## 2010-07-31 DIAGNOSIS — I059 Rheumatic mitral valve disease, unspecified: Secondary | ICD-10-CM | POA: Insufficient documentation

## 2010-07-31 DIAGNOSIS — Z01812 Encounter for preprocedural laboratory examination: Secondary | ICD-10-CM | POA: Insufficient documentation

## 2010-07-31 LAB — GLUCOSE, CAPILLARY
Glucose-Capillary: 155 mg/dL — ABNORMAL HIGH (ref 70–99)
Glucose-Capillary: 111 mg/dL — ABNORMAL HIGH (ref 70–99)
Glucose-Capillary: 129 mg/dL — ABNORMAL HIGH (ref 70–99)

## 2010-07-31 LAB — SURGICAL PCR SCREEN
MRSA, PCR: NEGATIVE
Staphylococcus aureus: NEGATIVE

## 2010-08-01 ENCOUNTER — Ambulatory Visit (HOSPITAL_COMMUNITY): Payer: Medicare Other

## 2010-08-09 ENCOUNTER — Other Ambulatory Visit: Payer: Self-pay | Admitting: Cardiology

## 2010-08-09 ENCOUNTER — Ambulatory Visit: Payer: Medicare Other | Admitting: *Deleted

## 2010-08-09 ENCOUNTER — Encounter (INDEPENDENT_AMBULATORY_CARE_PROVIDER_SITE_OTHER): Payer: Medicare Other | Admitting: Cardiology

## 2010-08-09 DIAGNOSIS — I428 Other cardiomyopathies: Secondary | ICD-10-CM

## 2010-08-09 DIAGNOSIS — M7989 Other specified soft tissue disorders: Secondary | ICD-10-CM

## 2010-08-09 LAB — ICD DEVICE OBSERVATION
AL AMPLITUDE: 3.7 mv
AL IMPEDENCE ICD: 362.5 Ohm
AL THRESHOLD: 0.75 V
ATRIAL PACING ICD: 78 pct
BAMS-0001: 150 {beats}/min
BAMS-0003: 70 {beats}/min
CHARGE TIME: 9 s
DEVICE MODEL ICD: 794739
FVT: 0
HV IMPEDENCE: 62 Ohm
LV LEAD IMPEDENCE ICD: 562.5 Ohm
LV LEAD THRESHOLD: 0.375 V
MODE SWITCH EPISODES: 0
PACEART VT: 0
RV LEAD AMPLITUDE: 12 mv
RV LEAD IMPEDENCE ICD: 587.5 Ohm
RV LEAD THRESHOLD: 0.625 V
TOT-0006: 20120710000000
TOT-0007: 1
TOT-0008: 0
TOT-0009: 1
TOT-0010: 4
TZON-0003SLOWVT: 340 ms
TZON-0004SLOWVT: 20
TZON-0005SLOWVT: 6
TZON-0010SLOWVT: 40 ms
VENTRICULAR PACING ICD: 94 pct
VF: 0

## 2010-08-09 NOTE — Progress Notes (Signed)
icd check in clinic  

## 2010-08-13 ENCOUNTER — Encounter: Payer: Self-pay | Admitting: Internal Medicine

## 2010-08-13 NOTE — Op Note (Signed)
NAMEMarland Kitchen  EMELIE, NEWSOM NO.:  [id number]  MEDICAL RECORD NO.:  [id number]  LOCATION:  3732                         FACILITY:  [facility]  PHYSICIAN:  Hillis Range, MD       DATE OF BIRTH:  August 06, 1945  DATE OF PROCEDURE:  07/31/2010 DATE OF DISCHARGE:                              OPERATIVE REPORT   PREPROCEDURE DIAGNOSES: 1. 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 2. Nonischemic cardiomyopathy. 3. Chronic systolic dysfunction. 4. Left bundle-branch block.  POSTPROCEDURE DIAGNOSES: 1. 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 2. Nonischemic cardiomyopathy. 3. Chronic systolic dysfunction. 4. Left bundle-branch block.  PROCEDURES: 1. Biventricular ICD right atrial lead revision with removal of the     old lead. 2. Left upper extremity venography. 3. Biventricular ICD interrogation and then reprogramming at the time     of atrial lead revision.  INTRODUCTION:  Ms. Kocak is a very pleasant 65 year old female with a history of a nonischemic cardiomyopathy (ejection fraction 25-30%), New [place] Heart Association class III congestive heart failure, and left bundle-branch block, who presents today for biventricular ICD system revision.  She has recently been observed to have significant drop in her atrial lead impedance with inadequate sensing and a failure to capture.  She therefore presents today for atrial lead revision.  DESCRIPTION OF PROCEDURE:  Informed written consent was obtained and the patient was brought to the electrophysiology lab in the fasting state. She was adequately sedated with intravenous Versed and fentanyl as outlined in the nursing report.  The patient's biventricular ICD was interrogated and the atrial lead was confirmed again to have inadequate function.  Her left chest was prepped and draped in the usual  sterile fashion by the EP lab staff.  The skin overlying her existing defibrillator was infiltrated with lidocaine for local analgesia.  A 4- cm incision was made over the left deltopectoral region.  The defibrillator was carefully removed using a combination of sharp and blunt dissection.  The atrial right ventricular and left ventricular leads were examined.  The right atrial lead was found to have a breach in the insulation at the header.  This was felt to likely be the source for the patient's atrial lead dysfunction.  The right ventricular and the left ventricular leads were examined and their integrities were confirmed to be intact.  The right ventricular and left ventricular leads were therefore not disconnected from the device today.  The atrial lead was disconnected from the device.  A venogram of the left upper extremity was performed by hand injection of nonionic contrast.  This demonstrated a large and patent left axillary vein with a moderate-sized left subclavian vein.  The previously implanted leads were through a small left cephalic vein.  The left axillary vein was therefore cannulated with fluoroscopic visualization.  A St. Jude Medical Tendril STS model [phone] (serial [id number]) right atrial lead was advanced with fluoroscopic visualization into the right atrial appendage.  In this location, P-waves measured 3.4 mV with impedance of 455 ohms and a threshold of 1 volt at 0.5 milliseconds.  The lead was therefore secured to the pectoralis  fascia using #2 silk suture over the suture sleeves. The previously implanted atrial lead was then evaluated.  The suture sleeve and two separate #2 silk sutures were removed.  A single #2 silk hemostatic stitch was also identified and removed.  A stylet was placed into this lead.  The lead was unscrewed from the myocardium carefully with counterclockwise rotation of the lead turning tool.  The active fixation mechanism was observed  to unscrew from the myocardium.  The lead was then easily removed from the body with gentle manual traction under fluoroscopic visualization.  The newly implanted atrial lead with a serial number of [id number] was then connected to the existing biventricular ICD.  With all 3 leads in place, the device was then returned to the pocket.  The pocket was irrigated with copious gentamicin solution.  A single #2 silk suture was used to secure the device to the pectoralis fascia.  The pocket was then closed in 2 layers with 2.0 Vicryl suture for the subcutaneous and subcuticular layers. Steri-Strips and a sterile dressing were then applied.  Through the device, atrial lead P-waves measured 2.0 mV with impedance of 450 ohms and a threshold of 0.5 volts at 0.5 milliseconds.  Right ventricular lead R-waves measured 11.8 mV with impedance of 530 ohms and a threshold of 0.7 volts at 0.5 milliseconds.  The left ventricular lead impedance was 510 ohms with a threshold of 0.5 volts at 0.5 milliseconds.  The high voltage impedance was 73 ohms.  Defibrillation threshold testing was not performed today.  The procedure was therefore considered completed.  There were no early apparent complications.  CONCLUSIONS: 1. Successful biventricular ICD system revision with a new St. Jude     Medical [phone] (serial [id number]) right atrial lead implanted.     The previously implanted atrial lead was removed without     difficulty. 2. No early apparent complications.     Hillis Range, MD     JA/MEDQ  D:  07/31/2010  T:  07/31/2010  Job:  [id number]  cc:   Learta Codding, MD,FACC Primary Care Physician  Electronically Signed by Hillis Range MD on 08/13/2010 09:59:35 AM

## 2010-08-13 NOTE — Discharge Summary (Signed)
NAMEMarland Kitchen  Melissa Paul, Melissa Paul NO.:  [id number]  MEDICAL RECORD NO.:  [id number]  LOCATION:  3732                         FACILITY:  [facility]  PHYSICIAN:  Hillis Range, MD       DATE OF BIRTH:  02/18/1945  DATE OF ADMISSION:  07/31/2010 DATE OF DISCHARGE:  08/01/2010                              DISCHARGE SUMMARY   PRIMARY CARE PHYSICIAN:  Wyvonnia Lora, MD.  PRIMARY CARDIOLOGIST:  Learta Codding, MD, [facility]  ELECTROPHYSIOLOGIST:  Hillis Range, MD  PRIMARY DIAGNOSIS:  Atrial lead dysfunction with insulation breach.  SECONDARY DIAGNOSES: 1. Nonischemic cardiomyopathy with a documented ejection fraction of     25-30%. 2. Class III congestive heart failure. 3. Left bundle-branch block. 4. Hyperthyroidism. 5. Moderate mitral regurgitation. 6. Diabetes.  ALLERGIES: 1. PENICILLIN. 2. KEFLEX. 3. LEVAQUIN. 4. CLINDAMYCIN. 5. IVP DYE. 6. LASIX.  PROCEDURES THIS ADMISSION: 1. Biventricular ICD right atrial lead revision with removal of a     previously implanted lead.  Melissa Paul received a new model number     [id number] right atrial lead on July 31, 2010 by Dr. Johney Frame.  Her     previously implanted lead was removed.  The patient had no early     apparent complications.  Her new lead was attached to her     previously implanted [employer] Unify biventricular ICD.  The     previously implanted right ventricular model number [id number] and model     number [id number] left ventricular leads were utilized.  DFTs were not     checked at this procedure. 2. Chest x-ray on August 01, 2010 demonstrated no pneumothorax, status     post right atrial lead revision.  BRIEF HISTORY OF PRESENT ILLNESS:  Melissa Paul is a 65 year old female with nonischemic cardiomyopathy and class III congestive heart failure, who underwent biventricular ICD implantation in September 2011.  Upon followup, she was found to have malfunctioning of her atrial lead, which resulted in loss of AV synchrony and  increased heart failure symptoms.  Risks, benefits, and alternatives of right atrial lead revision were reviewed with the patient by Dr. Johney Frame and she wished to proceed.  HOSPITAL COURSE:  The patient was admitted on July 31, 2010 for planned right atrial lead revision.  This was carried out by Dr. Johney Frame with details outlined above.  She was monitored on telemetry overnight, which demonstrated AV pacing.  Chest x-ray was obtained, which demonstrated no pneumothorax.  The patient's device was interrogated and found to be functioning normally.  The patient's left chest was without hematoma or ecchymosis.  Dr. Johney Frame examined the patient on August 01, 2010 and considered her stable for discharge.  FOLLOWUP APPOINTMENTS: 1. [facility] in the [address] office on August 09, 2010 at 10 a.m. 2. Dr. Johney Frame in [place] office on November 02, 2010 at 1:30 p.m. 3. Dr. Andee Lineman in [facility] office on September 13, 2010 at 2 p.m. 4. Dr. Margo Common as scheduled.  DISCHARGE INSTRUCTIONS: 1. Increase activity slowly.2. No driving for 1 week. 3. Follow a low-sodium, heart-healthy diet. 4. See supplemental device discharge instructions for wound care and  arm mobility. 5. Keep the incisions clean and dry for 1 week.  DISCHARGE MEDICATIONS: 1. MiraLax 1 capsule daily as needed. 2. Zyrtec 10 mg daily. 3. Glucotrol 2.5 mg daily. 4. Zantac 150 mg daily. 5. Diazepam 2 mg 1 tablet 3 times daily. 6. Aspirin 81 mg daily. 7. Spironolactone 25 mg daily. 8. Furosemide 80 mg 1/2 tablet daily. 9. Levothyroxine 75 mcg 1/2 tablet daily. 10.Carvedilol 3.125 mg 1 tablet twice daily. 11.Digoxin 0.125 mg daily. 12.Losartan 25 mg daily.  DISPOSITION:  The patient was seen and examined by Dr. Johney Frame on August 01, 2010, and considered stable for discharge.  DURATION OF DISCHARGE ENCOUNTER:  35 minutes.     Gypsy Balsam, RN,BSN   ______________________________ Hillis Range,  MD    AS/MEDQ  D:  08/01/2010  T:  08/01/2010  Job:  [id number]  cc:   Learta Codding, MD,FACC Wyvonnia Lora, MD  Electronically Signed by Gypsy Balsam RNBSN on 08/02/2010 07:40:04 AM Electronically Signed by Hillis Range MD on 08/13/2010 09:59:33 AM

## 2010-08-13 NOTE — Op Note (Signed)
  NAMEMarland Kitchen  Melissa Paul, Melissa Paul NO.:  [id number]  MEDICAL RECORD NO.:  [id number]  LOCATION:  3732                         FACILITY:  [facility]  PHYSICIAN:  Hillis Range, MD       DATE OF BIRTH:  December 30, 1945  DATE OF PROCEDURE: DATE OF DISCHARGE:                              OPERATIVE REPORT   SURGEON:  Hillis Range, MD  PREPROCEDURE DIAGNOSES: 1. Chronic systolic dysfunction. 2. Nonischemic cardiomyopathy. 3. Left bundle-branch block. 4. 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  POSTPROCEDURE DIAGNOSES: 1. Chronic systolic dysfunction. 2. Nonischemic cardiomyopathy. 3. Left bundle-branch block. 4. 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  PROCEDURES: 1. ICD right atrial lead replacement with removal of the old lead. 2. Left upper extremity venography. 3. Biventricular ICD interrogation and reprogramming.  Dictation ended at this point.     Hillis Range, MD     JA/MEDQ  D:  07/31/2010  T:  07/31/2010  Job:  [id number]  Electronically Signed by Hillis Range MD on 08/13/2010 09:59:37 AM

## 2010-08-22 ENCOUNTER — Ambulatory Visit (INDEPENDENT_AMBULATORY_CARE_PROVIDER_SITE_OTHER): Payer: Medicare Other | Admitting: *Deleted

## 2010-08-22 DIAGNOSIS — I509 Heart failure, unspecified: Secondary | ICD-10-CM

## 2010-08-22 DIAGNOSIS — I428 Other cardiomyopathies: Secondary | ICD-10-CM

## 2010-08-22 DIAGNOSIS — I5022 Chronic systolic (congestive) heart failure: Secondary | ICD-10-CM

## 2010-08-22 NOTE — Progress Notes (Signed)
Wound re check. 

## 2010-08-25 ENCOUNTER — Other Ambulatory Visit: Payer: Self-pay | Admitting: Cardiology

## 2010-09-13 ENCOUNTER — Encounter: Payer: Self-pay | Admitting: Cardiology

## 2010-09-13 ENCOUNTER — Ambulatory Visit (INDEPENDENT_AMBULATORY_CARE_PROVIDER_SITE_OTHER): Payer: Medicare Other | Admitting: Cardiology

## 2010-09-13 VITALS — BP 99/63 | HR 61 | Ht 64.0 in | Wt 199.0 lb

## 2010-09-13 DIAGNOSIS — I5022 Chronic systolic (congestive) heart failure: Secondary | ICD-10-CM

## 2010-09-13 DIAGNOSIS — R42 Dizziness and giddiness: Secondary | ICD-10-CM

## 2010-09-13 DIAGNOSIS — I959 Hypotension, unspecified: Secondary | ICD-10-CM

## 2010-09-13 DIAGNOSIS — Z95810 Presence of automatic (implantable) cardiac defibrillator: Secondary | ICD-10-CM | POA: Insufficient documentation

## 2010-09-13 DIAGNOSIS — I428 Other cardiomyopathies: Secondary | ICD-10-CM

## 2010-09-13 MED ORDER — LOSARTAN POTASSIUM 25 MG PO TABS: ORAL_TABLET | ORAL | Status: DC

## 2010-09-13 NOTE — Patient Instructions (Signed)
   Stop Lasix  Decrease Losartan 25mg  to 1/2 tab (12.5mg ) daily Your physician recommends that you go to the [facility] for lab work in 7-10 days for BMET If the results of your test are normal or stable, you will receive a letter.  If they are abnormal, the nurse will contact you by phone. Follow up in  3 months - see above

## 2010-09-13 NOTE — Assessment & Plan Note (Signed)
Hypotension: As outlined above. At this point and based on echocardiogram I think we should stop the patient's Lasix altogether. However I will ask her to acute a low potassium diet particularly to continue spironolactone. We will check a BMET in 7-10 days to make sure that she does not become hyperkalemic without the use of loop diuretic in conjunction. I will also asked her to decrease losartan to 12.5 mg by mouth daily.

## 2010-09-13 NOTE — Assessment & Plan Note (Signed)
Nonischemic cardiomyopathy patient on a good medical regimen. EF much improved, contrary to patients symptoms. We are unable to up titrate medications due to relative low blood pressure of 82/52. It could be that the patient is dehydrated and will check a BNP level. We stopped Lasix altogether and decrease Losartan.

## 2010-09-13 NOTE — Assessment & Plan Note (Signed)
Status post CRT-D with minimal clinical improvement, but could be secondary to relative hypotension. See above

## 2010-09-13 NOTE — Progress Notes (Signed)
HPI The patient is a 65 year old female with a nonischemic cardiomyopathy, status post catheterization 2009. Last year the patient received a CRT-D. He has a history of left bundle branch block she was in NYHA class III. Unfortunately after her implant the patient could not clinically felt much of a difference. She still gets short of breath on minimal exertion. She states again doing shopping. She reports however no orthopnea PND palpitations or syncope. Last month the patient actually underwent lead revision because of an atrial lead dysfunction with insulation breech. The patient still feels sore over her pacemaker. She denies having substernal chest pain. Bedside echocardiogram demonstrated ejection fraction of 40-45% which is significant improvement from previously when he was 20-25%. Also there is almost no mitral regurgitation. Based on these findings I would think the patient has significant improvement while I was doing study she did report that she has a lot of dizziness and told me that "Dr. Johney Frame decrease her Lasix.  Allergies  Allergen Reactions  . Bactrim   . Cephalexin   . Clindamycin   . Contrast Media (Iodinated Diagnostic Agents)   . Latex   . Levofloxacin   . Macrobid   . Penicillins     Current Outpatient Prescriptions on File Prior to Visit  Medication Sig Dispense Refill  . aspirin 81 MG tablet Take 1 tablet by mouth daily.      . carvedilol (COREG) 6.25 MG tablet TAKE ONE TABLET BY MOUTH TWICE DAILY  60 tablet  6  . cetirizine (ZYRTEC) 10 MG tablet Take 10 mg by mouth daily.       . diazepam (VALIUM) 2 MG tablet Take 1 tablet by mouth three times per day.        . digoxin (LANOXIN) 0.125 MG tablet Take 1 tablet by mouth daily.       Marland Kitchen glipiZIDE (GLUCOTROL XL) 2.5 MG 24 hr tablet Take 2.5 mg by mouth daily.        Marland Kitchen levothyroxine (SYNTHROID, LEVOTHROID) 75 MCG tablet Take 1/2 tablet by mouth daily.        . polyethylene glycol (MIRALAX / GLYCOLAX) packet Use daily as  directed.       . ranitidine (ZANTAC) 150 MG capsule Take 150 mg by mouth 2 (two) times daily.       Marland Kitchen spironolactone (ALDACTONE) 25 MG tablet Take 1 tablet by mouth daily.         Past Medical History  Diagnosis Date  . Nonischemic cardiomyopathy     EF 25-30% by echo 06/2009  . CHF (congestive heart failure)     NYHA Class III  . LBBB (left bundle branch block)   . Hypothyroidism   . UTI (urinary tract infection)     Recurrent  . Glucose intolerance (pre-diabetes)   . Hyperlipemia   . Mitral regurgitation     Mild to Moderate    Past Surgical History  Procedure Date  . Cholecystectomy   . Total abdominal hysterectomy     (R) ovary removed  . Appendectomy   . Knee arthroscopy     Left  . Cardiac defibrillator placement 09/2009    St. Jude BiV ICD by Dr. Shanna Cisco class III    Family History  Problem Relation Age of Onset  . Diabetes Neg Hx   . Hypertension Neg Hx   . Coronary artery disease Neg Hx     History   Social History  . Marital Status: Married    Spouse Name: N/A  Number of Children: N/A  . Years of Education: N/A   Occupational History  . Not on file.   Social History Main Topics  . Smoking status: Former Smoker -- 0.3 packs/day for 20 years    Types: Cigarettes    Quit date: 01/22/1996  . Smokeless tobacco: Never Used  . Alcohol Use: No  . Drug Use: No  . Sexually Active: Not on file   Other Topics Concern  . Not on file   Social History Narrative  . No narrative on file   WGN:FAOZHYQMV positives as outlined above. The remainder of the 18  point review of systems is negative  PHYSICAL EXAM BP 99/63  Pulse 61  Ht 5\' 4"  (1.626 m)  Wt 199 lb (90.266 kg)  BMI 34.16 kg/m2  SpO2 97%  General: Overweight white female Head: Normocephalic and atraumatic Eyes:PERRLA/EOMI intact, conjunctiva and lids normal Ears: No deformity or lesions Mouth:normal dentition, normal posterior pharynx Neck: Supple, no JVD.  No masses, thyromegaly or  abnormal cervical nodes Lungs: Normal breath sounds bilaterally without wheezing.  Normal percussion Chest: pacemaker pocket well- healed. Cardiac: regular rate and rhythm with normal S1 and S2, no S3 or S4.  PMI is normal.  No pathological murmurs Abdomen: Normal bowel sounds, abdomen is soft and nontender without masses, organomegaly or hernias noted.  No hepatosplenomegaly MSK: Back normal, normal gait muscle strength and tone normal Vascular: Pulse is normal in all 4 extremities Extremities: No peripheral pitting edema Neurologic: Alert and oriented x 3 Skin: Intact without lesions or rashes Lymphatics: No significant adenopathy Psychologic: Normal affect  ECG:NA  ASSESSMENT AND PLAN

## 2010-09-28 ENCOUNTER — Telehealth: Payer: Self-pay | Admitting: *Deleted

## 2010-09-28 NOTE — Telephone Encounter (Signed)
Patient informed. 

## 2010-09-28 NOTE — Telephone Encounter (Signed)
Message copied by Eustace Moore on Fri Sep 28, 2010  4:21 PM ------      Message from: Lewayne Bunting E      Created: Thu Sep 27, 2010  3:15 PM       BMET stable hemoglobin A1c within normal limits

## 2010-10-15 LAB — POCT I-STAT 3, ART BLOOD GAS (G3+)
Acid-base deficit: 1
Bicarbonate: 22.9
O2 Saturation: 92
Operator id: 284701
TCO2: 24
pCO2 arterial: 35.1
pH, Arterial: 7.421 — ABNORMAL HIGH
pO2, Arterial: 63 — ABNORMAL LOW

## 2010-10-15 LAB — BASIC METABOLIC PANEL
BUN: 10
BUN: 11
BUN: 12
BUN: 12
BUN: 13
BUN: 13
BUN: 13
BUN: 14
BUN: 9
CO2: 26
CO2: 26
CO2: 27
CO2: 28
CO2: 28
CO2: 28
CO2: 28
CO2: 28
CO2: 30
Calcium: 8.2 — ABNORMAL LOW
Calcium: 8.6
Calcium: 8.6
Calcium: 8.7
Calcium: 8.7
Calcium: 8.8
Calcium: 8.8
Calcium: 8.9
Calcium: 9.6
Chloride: 100
Chloride: 101
Chloride: 102
Chloride: 102
Chloride: 103
Chloride: 104
Chloride: 104
Chloride: 105
Chloride: 99
Creatinine, Ser: 0.76
Creatinine, Ser: 0.77
Creatinine, Ser: 0.86
Creatinine, Ser: 0.89
Creatinine, Ser: 0.9
Creatinine, Ser: 0.97
Creatinine, Ser: 0.97
Creatinine, Ser: 0.98
Creatinine, Ser: 1
GFR calc Af Amer: >60
GFR calc Af Amer: >60
GFR calc Af Amer: >60
GFR calc Af Amer: >60
GFR calc Af Amer: >60
GFR calc Af Amer: >60
GFR calc Af Amer: >60
GFR calc Af Amer: >60
GFR calc Af Amer: >60
GFR calc non Af Amer: 56 — ABNORMAL LOW
GFR calc non Af Amer: 57 — ABNORMAL LOW
GFR calc non Af Amer: 58 — ABNORMAL LOW
GFR calc non Af Amer: 58 — ABNORMAL LOW
GFR calc non Af Amer: >60
GFR calc non Af Amer: >60
GFR calc non Af Amer: >60
GFR calc non Af Amer: >60
GFR calc non Af Amer: >60
Glucose, Bld: 103 — ABNORMAL HIGH
Glucose, Bld: 112 — ABNORMAL HIGH
Glucose, Bld: 161 — ABNORMAL HIGH
Glucose, Bld: 87
Glucose, Bld: 90
Glucose, Bld: 90
Glucose, Bld: 92
Glucose, Bld: 93
Glucose, Bld: 94
Potassium: 3.5
Potassium: 3.7
Potassium: 3.7
Potassium: 3.8
Potassium: 3.9
Potassium: 4
Potassium: 4
Potassium: 4.2
Potassium: 4.3
Sodium: 132 — ABNORMAL LOW
Sodium: 135
Sodium: 136
Sodium: 137
Sodium: 137
Sodium: 137
Sodium: 138
Sodium: 138
Sodium: 140

## 2010-10-15 LAB — COMPREHENSIVE METABOLIC PANEL
ALT: 12
AST: 16
Albumin: 3.7
Alkaline Phosphatase: 64
BUN: 9
CO2: 30
Calcium: 8.9
Chloride: 105
Creatinine, Ser: 0.91
GFR calc Af Amer: >60
GFR calc non Af Amer: >60
Glucose, Bld: 116 — ABNORMAL HIGH
Potassium: 4
Sodium: 139
Total Bilirubin: 0.7
Total Protein: 6.3

## 2010-10-15 LAB — CARDIAC PANEL(CRET KIN+CKTOT+MB+TROPI)
CK, MB: 1.1
CK, MB: 1.1
CK, MB: 1.1
Relative Index: 0.6
Relative Index: 0.6
Relative Index: 0.7
Total CK: 161
Total CK: 189 — ABNORMAL HIGH
Total CK: 191 — ABNORMAL HIGH
Troponin I: 0.01
Troponin I: 0.02
Troponin I: <0.01

## 2010-10-15 LAB — URINE CULTURE
Colony Count: >=100000
Special Requests: NEGATIVE

## 2010-10-15 LAB — URINALYSIS, ROUTINE W REFLEX MICROSCOPIC
Bilirubin Urine: NEGATIVE
Glucose, UA: NEGATIVE
Hgb urine dipstick: NEGATIVE
Ketones, ur: NEGATIVE
Nitrite: NEGATIVE
Protein, ur: NEGATIVE
Specific Gravity, Urine: 1.008
Urobilinogen, UA: 1
pH: 6

## 2010-10-15 LAB — CBC
HCT: 36
HCT: 39.8
Hemoglobin: 12.4
Hemoglobin: 13.5
MCHC: 33.9
MCHC: 34.3
MCV: 90.3
MCV: 91.3
Platelets: 216
Platelets: 257
RBC: 3.99
RBC: 4.36
RDW: 14.1
RDW: 14.6
WBC: 12 — ABNORMAL HIGH
WBC: 6.7

## 2010-10-15 LAB — POCT I-STAT 3, VENOUS BLOOD GAS (G3P V)
Acid-base deficit: 2
Acid-base deficit: 3 — ABNORMAL HIGH
Bicarbonate: 22.1
Bicarbonate: 22.9
O2 Saturation: 67
O2 Saturation: 69
Operator id: 284701
Operator id: 284701
TCO2: 23
TCO2: 24
pCO2, Ven: 37.6 — ABNORMAL LOW
pCO2, Ven: 40.3 — ABNORMAL LOW
pH, Ven: 7.362 — ABNORMAL HIGH
pH, Ven: 7.376 — ABNORMAL HIGH
pO2, Ven: 36
pO2, Ven: 37

## 2010-10-15 LAB — LIPID PANEL
Cholesterol: 139
HDL: 42
LDL Cholesterol: 87
Total CHOL/HDL Ratio: 3.3
Triglycerides: 51
VLDL: 10

## 2010-10-15 LAB — PROTEIN ELECTROPHORESIS, SERUM
Albumin ELP: 59.5
Alpha-1-Globulin: 4.7
Alpha-2-Globulin: 8.6
Beta 2: 5.8
Beta Globulin: 6.2
Gamma Globulin: 15.2
M-Spike, %: NOT DETECTED
Total Protein ELP: 6.8

## 2010-10-15 LAB — TSH
TSH: 3.971
TSH: 5.329

## 2010-10-15 LAB — URINE MICROSCOPIC-ADD ON

## 2010-10-15 LAB — D-DIMER, QUANTITATIVE (NOT AT ARMC): D-Dimer, Quant: 0.33

## 2010-10-15 LAB — B-NATRIURETIC PEPTIDE (CONVERTED LAB): Pro B Natriuretic peptide (BNP): 831 — ABNORMAL HIGH

## 2010-10-15 LAB — T4: T4, Total: 9.7

## 2010-10-15 LAB — MAGNESIUM
Magnesium: 2.3
Magnesium: 2.3

## 2010-10-15 LAB — PROTIME-INR
INR: 1.1
Prothrombin Time: 14.1

## 2010-10-15 LAB — APTT: aPTT: 35

## 2010-11-02 ENCOUNTER — Encounter: Payer: Self-pay | Admitting: Internal Medicine

## 2010-11-02 ENCOUNTER — Ambulatory Visit (INDEPENDENT_AMBULATORY_CARE_PROVIDER_SITE_OTHER): Payer: Medicare Other | Admitting: Internal Medicine

## 2010-11-02 DIAGNOSIS — I429 Cardiomyopathy, unspecified: Secondary | ICD-10-CM

## 2010-11-02 DIAGNOSIS — I5022 Chronic systolic (congestive) heart failure: Secondary | ICD-10-CM

## 2010-11-02 DIAGNOSIS — I428 Other cardiomyopathies: Secondary | ICD-10-CM

## 2010-11-02 LAB — ICD DEVICE OBSERVATION
AL AMPLITUDE: 5 mv
AL IMPEDENCE ICD: 450 Ohm
AL THRESHOLD: 0.75 V
ATRIAL PACING ICD: 70 pct
BAMS-0001: 150 {beats}/min
BAMS-0003: 70 {beats}/min
CHARGE TIME: 9.1 s
DEVICE MODEL ICD: 794739
FVT: 0
HV IMPEDENCE: 75 Ohm
LV LEAD IMPEDENCE ICD: 600 Ohm
LV LEAD THRESHOLD: 0.375 V
MODE SWITCH EPISODES: 19
PACEART VT: 0
RV LEAD AMPLITUDE: 12 mv
RV LEAD IMPEDENCE ICD: 675 Ohm
RV LEAD THRESHOLD: 0.5 V
TOT-0006: 20120710000000
TOT-0007: 1
TOT-0008: 0
TOT-0009: 1
TOT-0010: 5
TZON-0003SLOWVT: 340 ms
TZON-0004SLOWVT: 20
TZON-0005SLOWVT: 6
TZON-0010SLOWVT: 40 ms
VENTRICULAR PACING ICD: 89 pct
VF: 0

## 2010-11-05 NOTE — Assessment & Plan Note (Signed)
Stable No change required today  Normal BiV ICD function See Pace Art report Rate response is adjusted to promote better exercise tolerance

## 2010-11-05 NOTE — Progress Notes (Signed)
The patient presents today for routine electrophysiology followup.  Since last being seen in our clinic, the patient reports doing very well.  Her SOB and exercise tolerance have improved.   Today, she denies symptoms of palpitations, chest pain, orthopnea, PND, lower extremity edema,  presyncope, syncope, or neurologic sequela.  The patient feels that she is tolerating medications without difficulties and is otherwise without complaint today.   Past Medical History  Diagnosis Date  . Nonischemic cardiomyopathy     EF 25-30% by echo 06/2009  . CHF (congestive heart failure)     NYHA Class III  . LBBB (left bundle branch block)   . Hypothyroidism   . UTI (urinary tract infection)     Recurrent  . Glucose intolerance (pre-diabetes)   . Hyperlipemia   . Mitral regurgitation     Mild to Moderate   Past Surgical History  Procedure Date  . Cholecystectomy   . Total abdominal hysterectomy     (R) ovary removed  . Appendectomy   . Knee arthroscopy     Left  . Cardiac defibrillator placement 09/2009    St. Jude BiV ICD by Dr. Shanna Cisco class III    Current Outpatient Prescriptions  Medication Sig Dispense Refill  . aspirin 81 MG tablet Take 1 tablet by mouth daily.      . carvedilol (COREG) 6.25 MG tablet TAKE ONE TABLET BY MOUTH TWICE DAILY  60 tablet  6  . cetirizine (ZYRTEC) 10 MG tablet Take 10 mg by mouth daily.       . diazepam (VALIUM) 2 MG tablet 2 (two) times daily as needed. Take 1 tablet by mouth three times per day.       . digoxin (LANOXIN) 0.125 MG tablet Take 1 tablet by mouth daily.       . furosemide (LASIX) 80 MG tablet as needed.        Marland Kitchen glipiZIDE (GLUCOTROL XL) 2.5 MG 24 hr tablet Take 2.5 mg by mouth daily.        Marland Kitchen levothyroxine (SYNTHROID, LEVOTHROID) 75 MCG tablet Take 1/2 tablet by mouth daily.        Marland Kitchen losartan (COZAAR) 25 MG tablet Take 1/2 tab (12.5mg ) daily      . polyethylene glycol (MIRALAX / GLYCOLAX) packet Use daily as directed.       . ranitidine  (ZANTAC) 150 MG capsule Take 150 mg by mouth 2 (two) times daily.       Marland Kitchen spironolactone (ALDACTONE) 25 MG tablet Take 1 tablet by mouth daily.         Allergies  Allergen Reactions  . Bactrim   . Cephalexin   . Clindamycin   . Contrast Media (Iodinated Diagnostic Agents)   . Latex   . Levofloxacin   . Macrobid   . Penicillins     History   Social History  . Marital Status: Married    Spouse Name: N/A    Number of Children: N/A  . Years of Education: N/A   Occupational History  . Not on file.   Social History Main Topics  . Smoking status: Former Smoker -- 0.3 packs/day for 20 years    Types: Cigarettes    Quit date: 01/22/1996  . Smokeless tobacco: Never Used  . Alcohol Use: No  . Drug Use: No  . Sexually Active: Not on file   Other Topics Concern  . Not on file   Social History Narrative  . No narrative on file  Family History  Problem Relation Age of Onset  . Diabetes Neg Hx   . Hypertension Neg Hx   . Coronary artery disease Neg Hx     ROS-  All systems are reviewed and are negative except as outlined in the HPI above    Physical Exam: Filed Vitals:   11/02/10 1326  BP: 108/69  Pulse: 69  Height: 5\' 4"  (1.626 m)  Weight: 202 lb 12.8 oz (91.989 kg)    GEN- The patient is overweight appearing, alert and oriented x 3 today.   Head- normocephalic, atraumatic Eyes-  Sclera clear, conjunctiva pink Ears- hearing intact Oropharynx- clear Neck- supple, no JVP Lymph- no cervical lymphadenopathy Lungs- Clear to ausculation bilaterally, normal work of breathing Chest- ICD pocket is well healed Heart- Regular rate and rhythm, no murmurs, rubs or gallops, PMI not laterally displaced GI- soft, NT, ND, + BS Extremities- no clubbing, cyanosis, or edema MS- no significant deformity or atrophy Skin- no rash or lesion Psych- euthymic mood, full affect Neuro- strength and sensation are intact  ICD interrogation- reviewed in detail today,  See PACEART  report  Assessment and Plan:

## 2010-12-11 ENCOUNTER — Ambulatory Visit (INDEPENDENT_AMBULATORY_CARE_PROVIDER_SITE_OTHER): Payer: Medicare Other | Admitting: Cardiology

## 2010-12-11 ENCOUNTER — Encounter: Payer: Self-pay | Admitting: Cardiology

## 2010-12-11 VITALS — BP 126/74 | HR 60 | Ht 64.0 in | Wt 205.0 lb

## 2010-12-11 DIAGNOSIS — R0602 Shortness of breath: Secondary | ICD-10-CM

## 2010-12-11 DIAGNOSIS — I428 Other cardiomyopathies: Secondary | ICD-10-CM

## 2010-12-11 DIAGNOSIS — I5023 Acute on chronic systolic (congestive) heart failure: Secondary | ICD-10-CM

## 2010-12-11 DIAGNOSIS — Z95810 Presence of automatic (implantable) cardiac defibrillator: Secondary | ICD-10-CM

## 2010-12-11 DIAGNOSIS — Z79899 Other long term (current) drug therapy: Secondary | ICD-10-CM

## 2010-12-11 MED ORDER — FUROSEMIDE 40 MG PO TABS: 40.0000 mg | ORAL_TABLET | Freq: Every day | ORAL | Status: DC

## 2010-12-11 NOTE — Patient Instructions (Signed)
Your physician wants you to follow-up in: 3 months. You will receive a reminder letter in the mail one-two months in advance. If you don't receive a letter, please call our office to schedule the follow-up appointment. Lasix (Furosemide) 40mg -Take 2 tablets today and then 1 tablet daily. Your physician recommends that you go to the [facility] for lab work: [employer]. DO LABS IN 10 DAYS.

## 2010-12-11 NOTE — Assessment & Plan Note (Signed)
The patient has volume overload. We will restart her Lasix. I've asked her to take Lasix 80 mg today and then continue 40 minutes by mouth daily. I reviewed her laboratory work and her renal function is within normal limits.

## 2010-12-11 NOTE — Progress Notes (Signed)
CC: Followup patient with nonischemic cardiomyopathy and increased shortness of breath  HPI:  The patient is a 65 year old female with history of nonischemic cardiomyopathy status post catheterization 2009. She has a left bundle branch block and he status post CRT-D. However she's had little improvement in her symptoms post implant. Ejection fraction approximately 40-45%. Recently the patient's Lasix was discontinued and now she has noticed significant increase in shortness of breath and weight gain. She gained approximately 7 pounds. She also reports orthopnea PND. She reports no palpitations or syncope.    PMH: reviewed and listed in Problem List in Electronic Records (and see below) Past Medical History  Diagnosis Date  . Nonischemic cardiomyopathy     EF 25-30% by echo 06/2009 Ejection fraction 40-45% by bedside echocardiogram August 2012   . CHF (congestive heart failure)     NYHA Class III Status post CRT-D   . LBBB (left bundle branch block)   . Hypothyroidism   . UTI (urinary tract infection)     Recurrent  . Glucose intolerance (pre-diabetes)   . Hyperlipemia   . Mitral regurgitation     Mild to Moderate     Allergies/SH/FHX : available in Electronic Records for review  Medications: Current Outpatient Prescriptions  Medication Sig Dispense Refill  . aspirin 81 MG tablet Take 1 tablet by mouth daily.      . carvedilol (COREG) 6.25 MG tablet TAKE ONE TABLET BY MOUTH TWICE DAILY  60 tablet  6  . cetirizine (ZYRTEC) 10 MG tablet Take 10 mg by mouth daily.       . diazepam (VALIUM) 2 MG tablet Take 4 mg by mouth daily.       . digoxin (LANOXIN) 0.125 MG tablet Take 1 tablet by mouth daily.       Marland Kitchen glipiZIDE (GLUCOTROL XL) 2.5 MG 24 hr tablet Take 2.5 mg by mouth daily.        Marland Kitchen levothyroxine (SYNTHROID, LEVOTHROID) 75 MCG tablet Take 1/2 tablet by mouth daily.        Marland Kitchen losartan (COZAAR) 25 MG tablet Take 1/2 tab (12.5mg ) daily      . polyethylene glycol (MIRALAX /  GLYCOLAX) packet Use daily as directed.       . ranitidine (ZANTAC) 150 MG capsule Take 150 mg by mouth daily.       Marland Kitchen spironolactone (ALDACTONE) 25 MG tablet Take 1 tablet by mouth daily.       . furosemide (LASIX) 40 MG tablet Take 1 tablet (40 mg total) by mouth daily.  30 tablet  6    ROS: No nausea or vomiting. No fever or chills.No melena or hematochezia.No bleeding.No claudication  Physical Exam: BP 126/74  Pulse 60  Ht 5\' 4"  (1.626 m)  Wt 205 lb (92.987 kg)  BMI 35.19 kg/m2  SpO2 98% General: Overweight white female mildly dyspneic Neck: Normal carotid upstroke no carotid bruits. No thyromegaly nonnodular thyroid Lungs: Crackles bilaterally approximately 1/3 of the way up Cardiac: Regular rate and rhythm with normal S1-S2 no murmur rubs or gallops Vascular: No edema. Normal peripheral pulses Skin: Warm and dry  12lead ECG: Not obtained Limited bedside ECHO:N/A   Assessment and Plan

## 2010-12-11 NOTE — Assessment & Plan Note (Signed)
Secondary to acute on chronic systolic heart failure. Medications adjusted as outlined above.

## 2010-12-11 NOTE — Assessment & Plan Note (Signed)
Patient reports no defibrillator discharges. She had an atrial lead revision secondary to insulation breech.

## 2010-12-18 ENCOUNTER — Telehealth: Payer: Self-pay | Admitting: Internal Medicine

## 2010-12-18 NOTE — Telephone Encounter (Signed)
New Msg: Pt calling wanting to know if remote device check was received. Please return pt call to discuss further.

## 2010-12-18 NOTE — Telephone Encounter (Signed)
Transmission has not been received, patient to resend.

## 2010-12-25 ENCOUNTER — Other Ambulatory Visit: Payer: Self-pay | Admitting: Internal Medicine

## 2011-01-31 ENCOUNTER — Ambulatory Visit (INDEPENDENT_AMBULATORY_CARE_PROVIDER_SITE_OTHER): Payer: Medicare Other | Admitting: *Deleted

## 2011-01-31 DIAGNOSIS — I428 Other cardiomyopathies: Secondary | ICD-10-CM

## 2011-02-01 ENCOUNTER — Encounter: Payer: Self-pay | Admitting: Internal Medicine

## 2011-02-04 ENCOUNTER — Encounter: Payer: Self-pay | Admitting: *Deleted

## 2011-02-07 NOTE — Progress Notes (Signed)
Remote defib check  

## 2011-03-14 ENCOUNTER — Ambulatory Visit (INDEPENDENT_AMBULATORY_CARE_PROVIDER_SITE_OTHER): Payer: Medicare Other | Admitting: Physician Assistant

## 2011-03-14 ENCOUNTER — Encounter: Payer: Self-pay | Admitting: Cardiology

## 2011-03-14 VITALS — BP 98/60 | HR 56 | Ht 64.0 in | Wt 207.0 lb

## 2011-03-14 DIAGNOSIS — I959 Hypotension, unspecified: Secondary | ICD-10-CM

## 2011-03-14 DIAGNOSIS — Z95810 Presence of automatic (implantable) cardiac defibrillator: Secondary | ICD-10-CM

## 2011-03-14 DIAGNOSIS — I5022 Chronic systolic (congestive) heart failure: Secondary | ICD-10-CM

## 2011-03-14 DIAGNOSIS — R0602 Shortness of breath: Secondary | ICD-10-CM

## 2011-03-14 DIAGNOSIS — I9589 Other hypotension: Secondary | ICD-10-CM | POA: Insufficient documentation

## 2011-03-14 MED ORDER — FUROSEMIDE 40 MG PO TABS: 80.0000 mg | ORAL_TABLET | Freq: Every day | ORAL | Status: DC

## 2011-03-14 NOTE — Patient Instructions (Signed)
   Lasix 80mg  daily Your physician recommends that you go to the [facility] for lab work today for CMET & BNP. If the results of your test are normal or stable, you will receive a letter.  If they are abnormal, the nurse will contact you by phone. Follow up in  3 months - see above

## 2011-03-14 NOTE — Assessment & Plan Note (Signed)
Patient denies any associated symptoms. States that current BP is actually better than in times past. Will need to continue meds at current doses.

## 2011-03-14 NOTE — Progress Notes (Signed)
HPI: Patient presents for scheduled followup.  Since last seen here in clinic, by Dr Andee Lineman last Nov, she had Lasix increased doubled to 80 mg daily, per Dr Margo Common, following c/o increase in weight to 211 lbs. She has since lost 4 lbs, and reports some improvement in her chronic DOE and LE edema. She continues to have sxs suggestive of Class III heart failure. However, she also is limited in her mobility by significant arthritis of the knees. She denies any ICD shocks. She has not had any f/u labs, since uptitration of Lasix.   Allergies  Allergen Reactions  . Bactrim   . Cephalexin   . Clindamycin   . Contrast Media (Iodinated Diagnostic Agents)   . Latex   . Levofloxacin   . Macrobid   . Penicillins     Current Outpatient Prescriptions  Medication Sig Dispense Refill  . aspirin 81 MG tablet Take 1 tablet by mouth daily.      . carvedilol (COREG) 6.25 MG tablet TAKE ONE TABLET BY MOUTH TWICE DAILY  60 tablet  6  . cetirizine (ZYRTEC) 10 MG tablet Take 10 mg by mouth daily.       . diazepam (VALIUM) 2 MG tablet Take 4 mg by mouth daily.       . digoxin (LANOXIN) 0.125 MG tablet Take 1 tablet by mouth daily.       . furosemide (LASIX) 40 MG tablet Take 1 tablet (40 mg total) by mouth daily.  30 tablet  6  . glipiZIDE (GLUCOTROL XL) 2.5 MG 24 hr tablet Take 2.5 mg by mouth daily.        Marland Kitchen levothyroxine (SYNTHROID, LEVOTHROID) 75 MCG tablet Take 1/2 tablet by mouth daily.        Marland Kitchen losartan (COZAAR) 25 MG tablet Take 1/2 tab (12.5mg ) daily      . polyethylene glycol (MIRALAX / GLYCOLAX) packet Use daily as directed.       . ranitidine (ZANTAC) 150 MG capsule Take 150 mg by mouth daily.       Marland Kitchen spironolactone (ALDACTONE) 25 MG tablet Take 1 tablet by mouth daily.         Past Medical History  Diagnosis Date  . Nonischemic cardiomyopathy     EF 25-30% by echo 06/2009  . CHF (congestive heart failure)     NYHA Class III  . LBBB (left bundle branch block)   . Hypothyroidism   . UTI  (urinary tract infection)     Recurrent  . Glucose intolerance (pre-diabetes)   . Hyperlipemia   . Mitral regurgitation     Mild to Moderate    Past Surgical History  Procedure Date  . Cholecystectomy   . Total abdominal hysterectomy     (R) ovary removed  . Appendectomy   . Knee arthroscopy     Left  . Cardiac defibrillator placement 09/2009    St. Jude BiV ICD by Dr. Shanna Cisco class III    History   Social History  . Marital Status: Married    Spouse Name: N/A    Number of Children: N/A  . Years of Education: N/A   Occupational History  . Not on file.   Social History Main Topics  . Smoking status: Former Smoker -- 0.3 packs/day for 20 years    Types: Cigarettes    Quit date: 01/22/1996  . Smokeless tobacco: Never Used  . Alcohol Use: No  . Drug Use: No  . Sexually Active: Not on file  Other Topics Concern  . Not on file   Social History Narrative  . No narrative on file    Family History  Problem Relation Age of Onset  . Diabetes Neg Hx   . Hypertension Neg Hx   . Coronary artery disease Neg Hx     ROS: no nausea, vomiting; no fever, chills; no melena, hematochezia; no claudication  PHYSICAL EXAM: Ht 5\' 4"  (1.626 m)  Wt 207 lb (93.895 kg)  BMI 35.53 kg/m2 GENERAL: 74 yof, obese; NAD HEENT: NCAT, PERRLA, EOMI; sclera clear; no xanthelasma NECK: palpable bilateral carotid pulses, no bruits; no JVD; no TM LUNGS: CTA bilaterally CARDIAC: RRR (S1, S2); no significant murmurs; no rubs or gallops ABDOMEN: protuberant EXTREMETIES: 2+ bilateral and mobilized and again this visit peripheral edema SKIN: warm/dry; no obvious rash/lesions MUSCULOSKELETAL: no joint deformity NEURO: no focal deficit; NL affect   EKG:    ASSESSMENT & PLAN:

## 2011-03-14 NOTE — Assessment & Plan Note (Signed)
Patient reporting no interim ICD shocks. She is scheduled to f/u with Dr Johney Frame in the next few months.

## 2011-03-14 NOTE — Assessment & Plan Note (Signed)
Patient currently euvolemic by exam. LE edema appears chronic. Recommend continuing Lasix at current dose of 80 mg daily. Will check CMET and BNP level. Any additional uptitration of meds is constrained by chronic hypotension. Will plan on early f/u with Dr Andee Lineman in 3 months.

## 2011-03-21 ENCOUNTER — Telehealth: Payer: Self-pay | Admitting: *Deleted

## 2011-03-21 NOTE — Telephone Encounter (Signed)
Message copied by Eustace Moore on Thu Mar 21, 2011  3:29 PM ------      Message from: Rande Brunt      Created: Fri Mar 15, 2011  1:02 PM       NL renal fxn, K, and BNP level. Continue current medication regimen.

## 2011-03-21 NOTE — Telephone Encounter (Signed)
Patient informed. 

## 2011-05-01 ENCOUNTER — Encounter: Payer: Medicare Other | Admitting: Internal Medicine

## 2011-05-13 ENCOUNTER — Encounter: Payer: Self-pay | Admitting: Internal Medicine

## 2011-05-13 ENCOUNTER — Ambulatory Visit (INDEPENDENT_AMBULATORY_CARE_PROVIDER_SITE_OTHER): Payer: Medicare Other | Admitting: *Deleted

## 2011-05-13 DIAGNOSIS — I5022 Chronic systolic (congestive) heart failure: Secondary | ICD-10-CM

## 2011-05-13 DIAGNOSIS — I429 Cardiomyopathy, unspecified: Secondary | ICD-10-CM

## 2011-05-13 DIAGNOSIS — I428 Other cardiomyopathies: Secondary | ICD-10-CM

## 2011-05-13 LAB — ICD DEVICE OBSERVATION
AL AMPLITUDE: 5 mv
AL IMPEDENCE ICD: 440 Ohm
AL THRESHOLD: 0.5 V
ATRIAL PACING ICD: 65 pct
BAMS-0001: 150 {beats}/min
BAMS-0003: 70 {beats}/min
CHARGE TIME: 9.1 s
DEVICE MODEL ICD: 794739
HV IMPEDENCE: 75 Ohm
LV LEAD IMPEDENCE ICD: 590 Ohm
LV LEAD THRESHOLD: 0.375 V
RV LEAD AMPLITUDE: 12 mv
RV LEAD IMPEDENCE ICD: 600 Ohm
RV LEAD THRESHOLD: 0.625 V
TZON-0003SLOWVT: 340 ms
TZON-0004SLOWVT: 20
TZON-0005SLOWVT: 6
TZON-0010SLOWVT: 40 ms
VENTRICULAR PACING ICD: 88 pct

## 2011-05-13 NOTE — Progress Notes (Signed)
icd check in clinic by industry

## 2011-05-16 ENCOUNTER — Encounter: Payer: Medicare Other | Admitting: Internal Medicine

## 2011-06-28 ENCOUNTER — Encounter: Payer: Self-pay | Admitting: Cardiology

## 2011-06-28 ENCOUNTER — Ambulatory Visit (INDEPENDENT_AMBULATORY_CARE_PROVIDER_SITE_OTHER): Payer: Medicare Other | Admitting: Cardiology

## 2011-06-28 VITALS — BP 92/64 | HR 66 | Resp 18 | Ht 64.0 in | Wt 211.0 lb

## 2011-06-28 DIAGNOSIS — Z95810 Presence of automatic (implantable) cardiac defibrillator: Secondary | ICD-10-CM

## 2011-06-28 DIAGNOSIS — I5023 Acute on chronic systolic (congestive) heart failure: Secondary | ICD-10-CM

## 2011-06-28 DIAGNOSIS — R0602 Shortness of breath: Secondary | ICD-10-CM

## 2011-06-28 DIAGNOSIS — I428 Other cardiomyopathies: Secondary | ICD-10-CM

## 2011-06-28 DIAGNOSIS — I429 Cardiomyopathy, unspecified: Secondary | ICD-10-CM

## 2011-06-28 DIAGNOSIS — I4891 Unspecified atrial fibrillation: Secondary | ICD-10-CM

## 2011-06-28 DIAGNOSIS — I080 Rheumatic disorders of both mitral and aortic valves: Secondary | ICD-10-CM

## 2011-06-28 MED ORDER — HYDROCHLOROTHIAZIDE 50 MG PO TABS: 50.0000 mg | ORAL_TABLET | Freq: Every day | ORAL | Status: DC

## 2011-06-28 NOTE — Patient Instructions (Addendum)
Your physician recommends that you schedule a follow-up appointment in: 2 months.   Your physician has recommended you make the following change in your medication: continue all medications and add (hctz)  Take hydrochlorothiazide 50 mg daily for 7 days then stop.   Your physician recommends that you return for lab work on June 14th 2013 at [facility] for BMET & BNP.

## 2011-06-28 NOTE — Assessment & Plan Note (Signed)
No significant mitral regurgitation by exam the disease to be reevaluated in the near future

## 2011-06-28 NOTE — Assessment & Plan Note (Signed)
History of atrial lead revision followed in EP clinic.

## 2011-06-28 NOTE — Progress Notes (Signed)
Melissa Bottoms, MD, [facility] ABIM Board Certified in Adult Cardiovascular Medicine,Internal Medicine and Critical Care Medicine    CC: Follow patient with heart failure  HPI:  Patient 66 year old female with a history of nonischemic cardiomyopathy status post CRT-D an ejection fraction less determined last year of 45%. The patient now reports increased fluid overload. She has increased shortness of breath on minimal exertion. She reports generalized weakness she feels tight in her chest upon exertion which he relates to her shortness of breath. She sometimes becomes diaphoretic and is also dizzy upon standing for prolonged period of time. She has 2-3 pillow orthopnea and PND. She has also noticed that her abdominal girth has increased in size. She has gained 4 pounds since her last visit. In addition she reports significant stress in her life because her husband was Alzheimer's disease and is full care.   PMH: reviewed and listed in Problem List in Electronic Records (and see below) Past Medical History  Diagnosis Date  . Nonischemic cardiomyopathy     EF 25-30% by echo 06/2009  . CHF (congestive heart failure)     NYHA Class III  . LBBB (left bundle branch block)   . Hypothyroidism   . UTI (urinary tract infection)     Recurrent  . Glucose intolerance (pre-diabetes)   . Hyperlipemia   . Mitral regurgitation     Mild to Moderate   Past Surgical History  Procedure Date  . Cholecystectomy   . Total abdominal hysterectomy     (R) ovary removed  . Appendectomy   . Knee arthroscopy     Left  . Cardiac defibrillator placement 09/2009    St. Jude BiV ICD by Dr. Shanna Cisco class III    Allergies/SH/FHX : available in Electronic Records for review  Allergies  Allergen Reactions  . Bactrim   . Cephalexin   . Clindamycin   . Contrast Media (Iodinated Diagnostic Agents)   . Latex   . Levofloxacin   . Nitrofurantoin Monohyd Macro   . Penicillins    History   Social History  .  Marital Status: Married    Spouse Name: N/A    Number of Children: N/A  . Years of Education: N/A   Occupational History  . Not on file.   Social History Main Topics  . Smoking status: Former Smoker -- 0.3 packs/day for 20 years    Types: Cigarettes    Quit date: 01/22/1996  . Smokeless tobacco: Never Used  . Alcohol Use: No  . Drug Use: No  . Sexually Active: Not on file   Other Topics Concern  . Not on file   Social History Narrative  . No narrative on file   Family History  Problem Relation Age of Onset  . Diabetes Neg Hx   . Hypertension Neg Hx   . Coronary artery disease Neg Hx     Medications: Current Outpatient Prescriptions  Medication Sig Dispense Refill  . aspirin 81 MG tablet Take 1 tablet by mouth daily.      . carvedilol (COREG) 6.25 MG tablet TAKE ONE TABLET BY MOUTH TWICE DAILY  60 tablet  6  . cetirizine (ZYRTEC) 10 MG tablet Take 10 mg by mouth daily.       . diazepam (VALIUM) 2 MG tablet Take 2 mg by mouth every 8 (eight) hours as needed.       . digoxin (LANOXIN) 0.125 MG tablet Take 1 tablet by mouth daily.       Marland Kitchen  furosemide (LASIX) 40 MG tablet Take 2 tablets (80 mg total) by mouth daily.      Marland Kitchen glipiZIDE (GLUCOTROL XL) 2.5 MG 24 hr tablet Take 2.5 mg by mouth daily.        . hydrochlorothiazide (HYDRODIURIL) 50 MG tablet Take 1 tablet (50 mg total) by mouth daily. For 7 days, then STOP & REPEAT LAB WORK  7 tablet  0  . levothyroxine (SYNTHROID, LEVOTHROID) 75 MCG tablet Take 1/2 tablet by mouth daily.        Marland Kitchen losartan (COZAAR) 25 MG tablet Take 1/2 tab (12.5mg ) daily      . polyethylene glycol (MIRALAX / GLYCOLAX) packet Use daily as directed.       . ranitidine (ZANTAC) 150 MG capsule Take 150 mg by mouth daily.       Marland Kitchen spironolactone (ALDACTONE) 25 MG tablet Take 1 tablet by mouth daily.       Marland Kitchen DISCONTD: hydrochlorothiazide (HYDRODIURIL) 50 MG tablet Take 50 mg by mouth daily. For 7 days, then STOP & REPEAT LAB WORK        ROS: No nausea or  vomiting. No fever or chills.No melena or hematochezia.No bleeding.No claudication  Physical Exam: BP 92/64  Pulse 66  Resp 18  Ht 5\' 4"  (1.626 m)  Wt 211 lb (95.709 kg)  BMI 36.22 kg/m2 General: Well-nourished white female, overweight with visible edema Neck: Normal carotid upstroke no carotid bruits. JVP is 8 cm Lungs: Crackles bilaterally approximately 1/3 up" sides little bit increased on the left side versus the right side Cardiac: Regular rate and rhythm with a paradoxically split second heart sound no pathological murmurs S3 is not on double Vascular: 2+ peripheral pitting edema. Normal distal pulses Skin: Warm and dry Physcologic: Normal affect  12lead ECG: AV sequential pacing Limited bedside ECHO:N/A No images are attached to the encounter.   Assessment and Plan  Hypotension Patient blood pressure continues to run low. However she's not orthostatic. Systolic blood pressure remains around 100. First I thought the patient was in atrial fibrillation but we'll be obtaining EKG she was in AV sequential pacing/CRT-D  ICD (implantable cardiac defibrillator) in place History of atrial lead revision followed in EP clinic.  SHORTNESS OF BREATH Patient has developed significant increased shortness of breath. She is volume overloaded and has gained 4 pounds again. I will continue the patient on her current dose of Lasix at 80 mg daily but will add hydrochlorothiazide 50 mg by mouth daily x7 days. At that time we will check a BMET and BNP level. She will then resumed back her Lasix 80 mg daily. We will followup the patient next couple of weeks and see if he needs additional diuresis. If she has poor response we'll also repeat an echocardiogram.  MITRAL REGURGITATION No significant mitral regurgitation by exam the disease to be reevaluated in the near future  CARDIOMYOPATHY, DILATED Patient has a nonischemic cardiomyopathy and last year had an ejection fraction of 45%. If she has  poor response to her diuretic regimen we will repeat an echocardiogram.    Patient Active Problem List  Diagnoses  . HYPOTHYROIDISM  . MITRAL REGURGITATION  . CARDIOMYOPATHY, DILATED  . LBBB  . CHRONIC SYSTOLIC HEART FAILURE  . SHORTNESS OF BREATH  . Dizziness  . ICD (implantable cardiac defibrillator) in place  . Hypotension

## 2011-06-28 NOTE — Assessment & Plan Note (Signed)
Patient has a nonischemic cardiomyopathy and last year had an ejection fraction of 45%. If she has poor response to her diuretic regimen we will repeat an echocardiogram.

## 2011-06-28 NOTE — Assessment & Plan Note (Signed)
Patient has developed significant increased shortness of breath. She is volume overloaded and has gained 4 pounds again. I will continue the patient on her current dose of Lasix at 80 mg daily but will add hydrochlorothiazide 50 mg by mouth daily x7 days. At that time we will check a BMET and BNP level. She will then resumed back her Lasix 80 mg daily. We will followup the patient next couple of weeks and see if he needs additional diuresis. If she has poor response we'll also repeat an echocardiogram.

## 2011-06-28 NOTE — Assessment & Plan Note (Signed)
Patient blood pressure continues to run low. However she's not orthostatic. Systolic blood pressure remains around 100. First I thought the patient was in atrial fibrillation but we'll be obtaining EKG she was in AV sequential pacing/CRT-D

## 2011-07-24 ENCOUNTER — Other Ambulatory Visit: Payer: Self-pay | Admitting: Cardiology

## 2011-07-26 ENCOUNTER — Other Ambulatory Visit: Payer: Self-pay | Admitting: *Deleted

## 2011-07-26 MED ORDER — CARVEDILOL 6.25 MG PO TABS: 6.2500 mg | ORAL_TABLET | Freq: Two times a day (BID) | ORAL | Status: DC

## 2011-08-13 ENCOUNTER — Telehealth: Payer: Self-pay | Admitting: Internal Medicine

## 2011-08-13 NOTE — Telephone Encounter (Signed)
New msg Pt wants to talk to you about how to do transmission.

## 2011-08-14 NOTE — Telephone Encounter (Signed)
Spoke w/pt in regards to remote scheduled for 08-15-11. All question was answered and pt aware of appt with Dr Johney Frame in October.

## 2011-08-15 ENCOUNTER — Ambulatory Visit (INDEPENDENT_AMBULATORY_CARE_PROVIDER_SITE_OTHER): Payer: Medicare Other | Admitting: *Deleted

## 2011-08-15 ENCOUNTER — Telehealth: Payer: Self-pay | Admitting: Internal Medicine

## 2011-08-15 ENCOUNTER — Encounter: Payer: Self-pay | Admitting: Internal Medicine

## 2011-08-15 DIAGNOSIS — Z95810 Presence of automatic (implantable) cardiac defibrillator: Secondary | ICD-10-CM

## 2011-08-15 DIAGNOSIS — I428 Other cardiomyopathies: Secondary | ICD-10-CM

## 2011-08-15 NOTE — Telephone Encounter (Signed)
New Problem:    Patient called in wanting to know if you received her wireless transmission today.  Please call back.

## 2011-08-15 NOTE — Telephone Encounter (Signed)
Received transmission from Merlin[employer]

## 2011-08-19 LAB — REMOTE ICD DEVICE
AL AMPLITUDE: 3.6 mv
AL IMPEDENCE ICD: 400 Ohm
ATRIAL PACING ICD: 65 pct
BAMS-0001: 150 {beats}/min
BAMS-0003: 70 {beats}/min
DEVICE MODEL ICD: 794739
HV IMPEDENCE: 73 Ohm
LV LEAD IMPEDENCE ICD: 530 Ohm
RV LEAD AMPLITUDE: 5.7 mv
RV LEAD IMPEDENCE ICD: 560 Ohm
TZON-0003SLOWVT: 340 ms
TZON-0004SLOWVT: 20
TZON-0005SLOWVT: 6
TZON-0010SLOWVT: 40 ms
VENTRICULAR PACING ICD: 88 pct

## 2011-08-26 ENCOUNTER — Encounter: Payer: Self-pay | Admitting: *Deleted

## 2011-08-29 ENCOUNTER — Ambulatory Visit (INDEPENDENT_AMBULATORY_CARE_PROVIDER_SITE_OTHER): Payer: Medicare Other | Admitting: Cardiology

## 2011-08-29 ENCOUNTER — Encounter: Payer: Self-pay | Admitting: Cardiology

## 2011-08-29 ENCOUNTER — Encounter: Payer: Self-pay | Admitting: Internal Medicine

## 2011-08-29 VITALS — BP 90/57 | HR 66 | Ht 64.0 in | Wt 207.0 lb

## 2011-08-29 DIAGNOSIS — R0602 Shortness of breath: Secondary | ICD-10-CM

## 2011-08-29 DIAGNOSIS — I080 Rheumatic disorders of both mitral and aortic valves: Secondary | ICD-10-CM

## 2011-08-29 DIAGNOSIS — I5022 Chronic systolic (congestive) heart failure: Secondary | ICD-10-CM

## 2011-08-29 DIAGNOSIS — Z95810 Presence of automatic (implantable) cardiac defibrillator: Secondary | ICD-10-CM

## 2011-08-29 DIAGNOSIS — R42 Dizziness and giddiness: Secondary | ICD-10-CM

## 2011-08-29 DIAGNOSIS — I428 Other cardiomyopathies: Secondary | ICD-10-CM

## 2011-08-29 MED ORDER — FUROSEMIDE 40 MG PO TABS: ORAL_TABLET | ORAL | Status: DC

## 2011-08-29 NOTE — Assessment & Plan Note (Signed)
CRT-D was interrogated today. She has 90% of the time biventricular pacing. No changes were made in the defibrillator settings. Impedance monitor demonstrate increase in volume status.

## 2011-08-29 NOTE — Assessment & Plan Note (Signed)
No significant murmur on exam but we will further evaluate with an echocardiogram if this is contributing to her increased symptomatology

## 2011-08-29 NOTE — Patient Instructions (Signed)
   Increase Lasix to 80mg  every morning & 40mg  every evening  Lab:  BMET - do in 7-10 days  Echo   Office will contact with results Follow up in  4-6 weeks

## 2011-08-29 NOTE — Assessment & Plan Note (Signed)
Past the patient to check her blood pressure during episodes of dizziness.

## 2011-08-29 NOTE — Assessment & Plan Note (Signed)
Significantly worsened. Plan as outlined above.

## 2011-08-29 NOTE — Assessment & Plan Note (Signed)
Patient has increased symptoms of heart failure. She has increased shortness of breath and she states that her lungs feel tight. She also feels she does not make much urine. I'm concerned that her ejection fraction may have declined. We will schedule her for an echocardiogram. I also asked her in the interim to increase her Lasix to 80 mg in the morning and 40 mg in the evening. His ejection fraction significantly worsened and we will admit the patient for intravenous milrinone. Given her low blood pressure however she may require dobutamine.

## 2011-08-29 NOTE — Progress Notes (Signed)
Melissa Bottoms, MD, [facility] ABIM Board Certified in Adult Cardiovascular Medicine,Internal Medicine and Critical Care Medicine    CC:   Increased shortness of breath                                                                               HPI:       Patient is a 66 year old female with a history of nonischemic cardiomyopathy. Several months ago she was seen in the office and reports increased shortness of breath. On Holter telemetry she was found to have decreased biventricular pacing. She was interrogated in the office today but has a greater than 90% of IV pacing. The last 2 weeks however she has had significant shortness of breath, 2 pillow orthopnea PND and markedly decreased exercise tolerance. She denies any chest pain. Although she feels weak she has no presyncope or syncope. Blood pressure runs relatively low.  PMH: reviewed and listed in Problem List in Electronic Records (and see below) Past Medical History  Diagnosis Date  . Nonischemic cardiomyopathy     EF 25-30% by echo 06/2009  . CHF (congestive heart failure)     NYHA Class III  . LBBB (left bundle branch block)   . Hypothyroidism   . UTI (urinary tract infection)     Recurrent  . Glucose intolerance (pre-diabetes)   . Hyperlipemia   . Mitral regurgitation     Mild to Moderate   Past Surgical History  Procedure Date  . Cholecystectomy   . Total abdominal hysterectomy     (R) ovary removed  . Appendectomy   . Knee arthroscopy     Left  . Cardiac defibrillator placement 09/2009    St. Jude BiV ICD by Dr. Shanna Cisco class III    Allergies/SH/FHX : available in Electronic Records for review  Allergies  Allergen Reactions  . Bactrim   . Cephalexin   . Clindamycin   . Contrast Media (Iodinated Diagnostic Agents)   . Latex   . Levofloxacin   . Nitrofurantoin Monohyd Macro   . Penicillins    History   Social History  . Marital Status: Married    Spouse Name: N/A    Number of Children: N/A  . Years of  Education: N/A   Occupational History  . Not on file.   Social History Main Topics  . Smoking status: Former Smoker -- 0.3 packs/day for 20 years    Types: Cigarettes    Quit date: 01/22/1996  . Smokeless tobacco: Never Used  . Alcohol Use: No  . Drug Use: No  . Sexually Active: Not on file   Other Topics Concern  . Not on file   Social History Narrative  . No narrative on file   Family History  Problem Relation Age of Onset  . Diabetes Neg Hx   . Hypertension Neg Hx   . Coronary artery disease Neg Hx     Medications: Current Outpatient Prescriptions  Medication Sig Dispense Refill  . aspirin 81 MG tablet Take 1 tablet by mouth daily.      . carvedilol (COREG) 6.25 MG tablet Take 1 tablet (6.25 mg total) by mouth 2 (two) times daily with a meal.  60 tablet  6  . cetirizine (ZYRTEC) 10 MG tablet Take 10 mg by mouth daily.       . diazepam (VALIUM) 2 MG tablet Take 2 mg by mouth every 12 (twelve) hours as needed.       . digoxin (LANOXIN) 0.125 MG tablet Take 1 tablet by mouth daily.       . furosemide (LASIX) 40 MG tablet Take 2 tabs (80mg ) every morning & 1 tab (40mg ) every evening      . glipiZIDE (GLUCOTROL XL) 2.5 MG 24 hr tablet Take 2.5 mg by mouth daily.        Marland Kitchen levothyroxine (SYNTHROID, LEVOTHROID) 75 MCG tablet Take 1/2 tablet by mouth daily.        Marland Kitchen losartan (COZAAR) 25 MG tablet Take 1/2 tab (12.5mg ) daily      . polyethylene glycol (MIRALAX / GLYCOLAX) packet Use daily as directed.       . ranitidine (ZANTAC) 150 MG capsule Take 150 mg by mouth daily.       Marland Kitchen spironolactone (ALDACTONE) 25 MG tablet Take 1 tablet by mouth daily.       Marland Kitchen DISCONTD: furosemide (LASIX) 40 MG tablet Take 2 tablets (80 mg total) by mouth daily.      Marland Kitchen DISCONTD: furosemide (LASIX) 40 MG tablet Take 2 tablets (80 mg total) by mouth daily.  60 tablet  6  . hydrochlorothiazide (HYDRODIURIL) 50 MG tablet Take 1 tablet (50 mg total) by mouth daily. For 7 days, then STOP & REPEAT LAB WORK   7 tablet  0    ROS: No nausea or vomiting. No fever or chills.No melena or hematochezia.No bleeding.No claudication  Physical Exam: BP 90/57  Pulse 66  Ht 5\' 4"  (1.626 m)  Wt 207 lb (93.895 kg)  BMI 35.53 kg/m2  SpO2 97% General: Overweight white female complaining of shortness of breath but in no obvious distress Neck: Normal carotid upstroke. No carotid bruits. JVP is 8 cm in the 45 angle. No thyromegaly nonnodular thyroid Lungs: Clear breath sounds bilaterally. No wheezing Cardiac: Regular rate and rhythm with normal S1-S2 no definite pathological murmurs. Vascular: Trace edema. Normal distal pulses bilaterally Skin: Warm and dry Physcologic: Normal affect  12lead ECG: Not obtained the patient did have ICD interrogation the device company today Limited bedside ECHO:N/A No images are attached to the encounter.   I reviewed and summarized the old records. I reviewed ECG and prior blood work.  Assessment and Plan  CARDIOMYOPATHY, DILATED Patient has increased symptoms of heart failure. She has increased shortness of breath and she states that her lungs feel tight. She also feels she does not make much urine. I'm concerned that her ejection fraction may have declined. We will schedule her for an echocardiogram. I also asked her in the interim to increase her Lasix to 80 mg in the morning and 40 mg in the evening. His ejection fraction significantly worsened and we will admit the patient for intravenous milrinone. Given her low blood pressure however she may require dobutamine.  CHRONIC SYSTOLIC HEART FAILURE Details as outlined above. Unable to increase ACE inhibitor or beta blocker due to low blood pressure.  Dizziness Past the patient to check her blood pressure during episodes of dizziness.  ICD (implantable cardiac defibrillator) in place CRT-D was interrogated today. She has 90% of the time biventricular pacing. No changes were made in the defibrillator settings.  Impedance monitor demonstrate increase in volume status.  MITRAL REGURGITATION No significant murmur on  exam but we will further evaluate with an echocardiogram if this is contributing to her increased symptomatology  SHORTNESS OF BREATH Significantly worsened. Plan as outlined above.    Patient Active Problem List  Diagnosis  . HYPOTHYROIDISM  . MITRAL REGURGITATION  . CARDIOMYOPATHY, DILATED  . LBBB  . CHRONIC SYSTOLIC HEART FAILURE  . SHORTNESS OF BREATH  . Dizziness  . ICD (implantable cardiac defibrillator) in place  . Hypotension

## 2011-08-29 NOTE — Assessment & Plan Note (Signed)
Details as outlined above. Unable to increase ACE inhibitor or beta blocker due to low blood pressure.

## 2011-08-30 ENCOUNTER — Telehealth: Payer: Self-pay | Admitting: *Deleted

## 2011-08-30 MED ORDER — POTASSIUM CHLORIDE CRYS ER 20 MEQ PO TBCR: 40.0000 meq | EXTENDED_RELEASE_TABLET | Freq: Every day | ORAL | Status: DC

## 2011-08-30 NOTE — Telephone Encounter (Signed)
Notes Recorded by Lesle Chris, LPN on 01/26/1094 at 10:12 AM Patient notified and verbalized understanding. Will send rx to [facility] [place]. Patient is already scheduled for BMET in 7-10 from yesterday OV due to increase in Lasix.

## 2011-08-30 NOTE — Telephone Encounter (Signed)
Message copied by Lesle Chris on Fri Aug 30, 2011 10:13 AM ------      Message from: Lewayne Bunting E      Created: Wed Aug 28, 2011  6:03 PM       BNP normal- low K give Kdur qdailyx 3 days only-patient on spironolactone

## 2011-09-05 ENCOUNTER — Other Ambulatory Visit (INDEPENDENT_AMBULATORY_CARE_PROVIDER_SITE_OTHER): Payer: Medicare Other

## 2011-09-05 ENCOUNTER — Other Ambulatory Visit: Payer: Self-pay

## 2011-09-05 DIAGNOSIS — I428 Other cardiomyopathies: Secondary | ICD-10-CM

## 2011-09-05 DIAGNOSIS — R0602 Shortness of breath: Secondary | ICD-10-CM

## 2011-09-16 ENCOUNTER — Telehealth: Payer: Self-pay | Admitting: *Deleted

## 2011-09-16 NOTE — Telephone Encounter (Signed)
Message copied by Lesle Chris on Mon Sep 16, 2011 12:04 PM ------      Message from: Learta Codding      Created: Sat Sep 14, 2011 11:57 AM       Despite increased symptoms of dyspnea EF improved to 35-40% and filling pressures normal by ECHO without evidence of CHF exacerbation.

## 2011-09-16 NOTE — Telephone Encounter (Signed)
Notes Recorded by Lesle Chris, LPN on 1/61/0960 at 12:04 PM Patient notified and verbalized understanding. Already has follow up scheduled with GD for 9/12 & can discuss further at that time.   Also, notified of normal labs.

## 2011-10-03 ENCOUNTER — Ambulatory Visit: Payer: Medicare Other | Admitting: Cardiology

## 2011-10-10 ENCOUNTER — Encounter: Payer: Self-pay | Admitting: Internal Medicine

## 2011-10-10 ENCOUNTER — Ambulatory Visit (INDEPENDENT_AMBULATORY_CARE_PROVIDER_SITE_OTHER): Payer: Medicare Other | Admitting: Internal Medicine

## 2011-10-10 VITALS — BP 103/67 | HR 77 | Ht 64.0 in | Wt 212.0 lb

## 2011-10-10 DIAGNOSIS — I4891 Unspecified atrial fibrillation: Secondary | ICD-10-CM | POA: Insufficient documentation

## 2011-10-10 DIAGNOSIS — I5022 Chronic systolic (congestive) heart failure: Secondary | ICD-10-CM

## 2011-10-10 DIAGNOSIS — I428 Other cardiomyopathies: Secondary | ICD-10-CM

## 2011-10-10 LAB — ICD DEVICE OBSERVATION
AL AMPLITUDE: 5 mv
AL IMPEDENCE ICD: 425 Ohm
AL THRESHOLD: 0.5 V
ATRIAL PACING ICD: 71 pct
BAMS-0001: 170 {beats}/min
BAMS-0003: 70 {beats}/min
CHARGE TIME: 9.3 s
DEVICE MODEL ICD: 794739
FVT: 0
HV IMPEDENCE: 74 Ohm
LV LEAD IMPEDENCE ICD: 587.5 Ohm
LV LEAD THRESHOLD: 0.5 V
MODE SWITCH EPISODES: 30
PACEART VT: 0
RV LEAD AMPLITUDE: 12 mv
RV LEAD IMPEDENCE ICD: 587.5 Ohm
RV LEAD THRESHOLD: 0.75 V
TOT-0006: 20120710000000
TOT-0007: 1
TOT-0008: 0
TOT-0009: 1
TOT-0010: 8
TZON-0003SLOWVT: 340 ms
TZON-0004SLOWVT: 20
TZON-0005SLOWVT: 6
TZON-0010SLOWVT: 40 ms
VENTRICULAR PACING ICD: 94 pct
VF: 0

## 2011-10-10 MED ORDER — CARVEDILOL 12.5 MG PO TABS: 12.5000 mg | ORAL_TABLET | Freq: Two times a day (BID) | ORAL | Status: DC

## 2011-10-10 NOTE — Patient Instructions (Addendum)
Your physician recommends that you schedule a follow-up appointment in: 1 year with Dr. Johney Frame. You will receive a reminder letter in the mail in about 10 months reminding you to call and schedule your appointment. If you don't receive this letter, please contact our office.  Your physician recommends that you schedule a follow-up appointment in: 6 weeks with cardiologists here in our clinic. Your physician has recommended you make the following change in your medication: Increased carvedilol to 12.5 mg twice daily. You may take (2) of your 6.25 mg tablets until they are finished. All other medications will remain the same. Your new prescription has been sent to your pharmacy.

## 2011-10-10 NOTE — Assessment & Plan Note (Signed)
Episodes are mostly nonsustained Increase coreg Today If episodes/ burden increase then we will consider anticoagulation therapy

## 2011-10-10 NOTE — Progress Notes (Signed)
PCP: TAPPER,DAVID B, MD  The patient presents today for routine electrophysiology followup.  Since last being seen in our clinic, the patient reports doing reasonably well.  Her SOB and exercise tolerance are stable.  She has occasional discomfort with movement of her left arm near her device site.  She denies ICD pocket swelling/redness/ drainage/ fevers or chills.   Today, she denies symptoms of palpitations, chest pain, orthopnea, PND, lower extremity edema,  presyncope, syncope, or neurologic sequela.  The patient feels that she is tolerating medications without difficulties and is otherwise without complaint today.   Past Medical History  Diagnosis Date  . Nonischemic cardiomyopathy     EF 25-30% by echo 06/2009  . CHF (congestive heart failure)     NYHA Class III  . LBBB (left bundle branch block)   . Hypothyroidism   . UTI (urinary tract infection)     Recurrent  . Glucose intolerance (pre-diabetes)   . Hyperlipemia   . Mitral regurgitation     Mild to Moderate   Past Surgical History  Procedure Date  . Cholecystectomy   . Total abdominal hysterectomy     (R) ovary removed  . Appendectomy   . Knee arthroscopy     Left  . Cardiac defibrillator placement 09/2009    St. Jude BiV ICD by Dr. Shanna Cisco class III    Current Outpatient Prescriptions  Medication Sig Dispense Refill  . aspirin 81 MG tablet Take 1 tablet by mouth daily.      . carvedilol (COREG) 12.5 MG tablet Take 1 tablet (12.5 mg total) by mouth 2 (two) times daily with a meal.  60 tablet  6  . cetirizine (ZYRTEC) 10 MG tablet Take 10 mg by mouth daily.       . diazepam (VALIUM) 2 MG tablet Take 2 mg by mouth every 12 (twelve) hours as needed.       . digoxin (LANOXIN) 0.125 MG tablet Take 1 tablet by mouth daily.       . furosemide (LASIX) 40 MG tablet Take 2 tabs (80mg ) every morning & 1 tab (40mg ) every evening      . glipiZIDE (GLUCOTROL XL) 2.5 MG 24 hr tablet Take 2.5 mg by mouth daily.        Marland Kitchen  levothyroxine (SYNTHROID, LEVOTHROID) 75 MCG tablet Take 1/2 tablet by mouth daily.        Marland Kitchen losartan (COZAAR) 25 MG tablet Take 1/2 tab (12.5mg ) daily      . polyethylene glycol (MIRALAX / GLYCOLAX) packet Use daily as directed.       . ranitidine (ZANTAC) 150 MG capsule Take 150 mg by mouth daily.       Marland Kitchen spironolactone (ALDACTONE) 25 MG tablet Take 1 tablet by mouth daily.       . hydrochlorothiazide (HYDRODIURIL) 50 MG tablet Take 1 tablet (50 mg total) by mouth daily. For 7 days, then STOP & REPEAT LAB WORK  7 tablet  0    Allergies  Allergen Reactions  . Bactrim   . Cephalexin   . Clindamycin   . Contrast Media (Iodinated Diagnostic Agents)   . Latex   . Levofloxacin   . Nitrofurantoin Monohyd Macro   . Penicillins     History   Social History  . Marital Status: Married    Spouse Name: N/A    Number of Children: N/A  . Years of Education: N/A   Occupational History  . Not on file.   Social History Main  Topics  . Smoking status: Former Smoker -- 0.3 packs/day for 20 years    Types: Cigarettes    Quit date: 01/22/1996  . Smokeless tobacco: Never Used  . Alcohol Use: No  . Drug Use: No  . Sexually Active: Not on file   Other Topics Concern  . Not on file   Social History Narrative  . No narrative on file    Family History  Problem Relation Age of Onset  . Diabetes Neg Hx   . Hypertension Neg Hx   . Coronary artery disease Neg Hx      Physical Exam: Filed Vitals:   10/10/11 0830  BP: 103/67  Pulse: 77  Height: 5\' 4"  (1.626 m)  Weight: 212 lb (96.163 kg)  SpO2: 96%    GEN- The patient is overweight appearing, alert and oriented x 3 today.   Head- normocephalic, atraumatic Eyes-  Sclera clear, conjunctiva pink Ears- hearing intact Oropharynx- clear Neck- supple, no JVP Lymph- no cervical lymphadenopathy Lungs- Clear to ausculation bilaterally, normal work of breathing Chest- ICD pocket is well healed Heart- Regular rate and rhythm, no murmurs,  rubs or gallops, PMI not laterally displaced GI- soft, NT, ND, + BS Extremities- no clubbing, cyanosis, trace edema  ICD interrogation- reviewed in detail today,  See [facility] report  Assessment and Plan:

## 2011-10-10 NOTE — Assessment & Plan Note (Signed)
Stable  Normal BiV ICD function She has PVCs with retrograde atrial activation leading to mode switch frequently. Mode switch rate is increased to 170 bpm.  She has nonsustained atrial tachycardia also.  I am therefore reluctant to extend the atrial blanking period at this time. Increase coreg to reduce PVC burden See [facility] report

## 2011-10-26 ENCOUNTER — Other Ambulatory Visit: Payer: Self-pay | Admitting: Cardiology

## 2011-11-21 ENCOUNTER — Encounter: Payer: Self-pay | Admitting: Cardiology

## 2011-11-21 ENCOUNTER — Ambulatory Visit (INDEPENDENT_AMBULATORY_CARE_PROVIDER_SITE_OTHER): Payer: Medicare Other | Admitting: Cardiology

## 2011-11-21 VITALS — BP 88/57 | HR 64 | Ht 64.0 in | Wt 215.8 lb

## 2011-11-21 DIAGNOSIS — I428 Other cardiomyopathies: Secondary | ICD-10-CM

## 2011-11-21 DIAGNOSIS — Z79899 Other long term (current) drug therapy: Secondary | ICD-10-CM

## 2011-11-21 DIAGNOSIS — R0602 Shortness of breath: Secondary | ICD-10-CM

## 2011-11-21 DIAGNOSIS — I5022 Chronic systolic (congestive) heart failure: Secondary | ICD-10-CM

## 2011-11-21 DIAGNOSIS — I4891 Unspecified atrial fibrillation: Secondary | ICD-10-CM

## 2011-11-21 DIAGNOSIS — Z95810 Presence of automatic (implantable) cardiac defibrillator: Secondary | ICD-10-CM

## 2011-11-21 DIAGNOSIS — I509 Heart failure, unspecified: Secondary | ICD-10-CM

## 2011-11-21 DIAGNOSIS — I080 Rheumatic disorders of both mitral and aortic valves: Secondary | ICD-10-CM

## 2011-11-21 MED ORDER — CARVEDILOL 3.125 MG PO TABS: 3.1250 mg | ORAL_TABLET | Freq: Two times a day (BID) | ORAL | Status: DC

## 2011-11-21 MED ORDER — CARVEDILOL 6.25 MG PO TABS: 6.2500 mg | ORAL_TABLET | Freq: Two times a day (BID) | ORAL | Status: DC

## 2011-11-21 NOTE — Assessment & Plan Note (Signed)
LVEF up to the range of 35-40% by most recent evaluation.

## 2011-11-21 NOTE — Assessment & Plan Note (Signed)
Keep followup with Dr. Allred. 

## 2011-11-21 NOTE — Progress Notes (Signed)
Clinical Summary Melissa Paul is a 66 y.o.female presenting for followup. She is a former patient of Dr. Andee Lineman, prefers to stay with the [facility] practice. She was seen in September by Dr. Johney Frame for device interrogation. At that time Coreg dose was increased due to findings of recent episodes of atrial fibrillation/tachycardia on device interrogation. Anticoagulation not started at that time.  Most recent echocardiogram in August showed improved LVEF to the range of 35-40% with diffuse hypokinesis, normal LV filling pressures. Lab work in August showed BUN 15, creatinine 0.9, potassium 4.0.  She states that she is "tired", not sleeping well, cares for her husband with dementia. She reports a lot of stress. Also describes NYHA class III dyspnea, no chest pain. Her weight is up 3 pounds from the last visit.  I reviewed her medications. Blood pressure looks to be low most the time in reviewing her chart. This is even on very low-dose ARB. Not certain whether the increase in Coreg is making an impact since her last visit in August.  I did talk with her briefly about the possibility of further evaluation through the advanced heart failure clinic if we are not able to get her symptomatically stable with medication adjustments, also limited by her blood pressure. Right heart catheterization might ultimately be a consideration.   Allergies  Allergen Reactions  . Bactrim   . Cephalexin   . Clindamycin   . Contrast Media (Iodinated Diagnostic Agents)   . Latex   . Levofloxacin   . Nitrofurantoin Monohyd Macro   . Penicillins     Current Outpatient Prescriptions  Medication Sig Dispense Refill  . aspirin 81 MG tablet Take 1 tablet by mouth daily.      . carvedilol (COREG) 3.125 MG tablet Take 1 tablet (3.125 mg total) by mouth 2 (two) times daily with a meal. With 6.25 mg  60 tablet  3  . carvedilol (COREG) 6.25 MG tablet Take 1 tablet (6.25 mg total) by mouth 2 (two) times daily with a meal.  With 3.125 mg  60 tablet  3  . cetirizine (ZYRTEC) 10 MG tablet Take 10 mg by mouth daily.       . diazepam (VALIUM) 2 MG tablet Take 2 mg by mouth every 12 (twelve) hours as needed.       . digoxin (LANOXIN) 0.125 MG tablet Take 1 tablet by mouth daily.       . furosemide (LASIX) 40 MG tablet Take 80 mg by mouth daily.      Marland Kitchen glipiZIDE (GLUCOTROL XL) 2.5 MG 24 hr tablet Take 2.5 mg by mouth daily.        Marland Kitchen levothyroxine (SYNTHROID, LEVOTHROID) 75 MCG tablet Take 1/2 tablet by mouth daily.        Marland Kitchen losartan (COZAAR) 25 MG tablet Take 1/2 tab (12.5mg ) daily      . polyethylene glycol (MIRALAX / GLYCOLAX) packet Use daily as directed.       . ranitidine (ZANTAC) 150 MG capsule Take 150 mg by mouth daily.       Marland Kitchen spironolactone (ALDACTONE) 25 MG tablet Take 1 tablet by mouth daily.       Marland Kitchen DISCONTD: furosemide (LASIX) 40 MG tablet Take 2 tabs (80mg ) every morning & 1 tab (40mg ) every evening      . DISCONTD: losartan (COZAAR) 25 MG tablet TAKE ONE TABLET BY MOUTH EVERY DAY  30 tablet  5    Past Medical History  Diagnosis Date  . Nonischemic cardiomyopathy  LVEF 35-40% by echo 8/13  . Chronic systolic heart failure   . LBBB (left bundle branch block)   . Hypothyroidism   . UTI (urinary tract infection)     Recurrent  . Glucose intolerance (pre-diabetes)   . Hyperlipemia   . Mitral regurgitation     Mild to Moderate  . Paroxysmal atrial fibrillation     Brief episodes by device interrogation    Past Surgical History  Procedure Date  . Cholecystectomy   . Total abdominal hysterectomy     (R) ovary removed  . Appendectomy   . Knee arthroscopy     Left  . Cardiac defibrillator placement 09/2009    St. Jude BiV ICD by Dr. Shanna Cisco class III    Social History Melissa Paul reports that she quit smoking about 15 years ago. Her smoking use included Cigarettes. She has a 6 pack-year smoking history. She has never used smokeless tobacco. Melissa Paul reports that she does not drink  alcohol.  Review of Systems No palpitations or device discharges. No syncope. Has dependent edema toward the end of the day. Appetite is fair. Otherwise negative  Physical Examination Filed Vitals:   11/21/11 0945  BP: 88/57  Pulse: 64   Filed Weights   11/21/11 0945  Weight: 215 lb 12.8 oz (97.886 kg)   Patient in no acute distress. HEENT: Conjunctiva and lids normal, oropharynx clear. Neck: Supple, no elevated JVP, no carotid bruits, no thyromegaly. Lungs: Clear to auscultation, nonlabored breathing at rest. Cardiac: Regular rate and rhythm, no S3 or significant systolic murmur, no pericardial rub. Abdomen: Soft, nontender, bowel sounds present, no guarding or rebound. Extremities: Trace edema. Skin: Warm and dry. Musculoskeletal: No kyphosis. Neuropsychiatric: Alert and oriented x3, affect grossly appropriate.   Problem List and Plan   CHRONIC SYSTOLIC HEART FAILURE Meeting patient for first time today in clinic. I reviewed her records. Difficult to get a sense of her symptoms long-term, although she seems to be more symptomatic within the last few months in terms of shortness of breath and fatigue. She is chronically hypotensive based on chart review despite relatively low-dose medications. Weight is up 3 pounds. Coreg was increased most recently, and I would like to cut this back to 9.375 mg twice daily for the time being. We did discuss as needed intensification of Lasix based on her weight, and her lab work shows normal renal function as of August. Willt have her come in within the next few weeks with a BMET, BNP, and also liver function tests. I am concerned that if she continues to clinically deteriorate, he may need to consider further evaluation such as right heart catheterization and possibly inotropes.  Atrial fibrillation Brief episodes noted on device interrogation by Dr. Johney Frame back in August. Anticoagulation was not initiated at that time. She reports no  palpitations.  CARDIOMYOPATHY, DILATED LVEF up to the range of 35-40% by most recent evaluation.  ICD (implantable cardiac defibrillator) in place Keep followup with Dr. Johney Frame.  MITRAL REGURGITATION Described as trivial by echocardiogram in August.    [name], M.D., F.A.C.C.

## 2011-11-21 NOTE — Patient Instructions (Addendum)
Your physician recommends that you schedule a follow-up appointment in: 3 weeks. Your physician has recommended you make the following change in your medication: Decreased your carvedilol to 9.375 mg twice daily. You will have a prescription for carvedilol 6.25 mg and 3.125 mg to equal this dosage of 9.375 mg. Your new prescriptions have been sent to your pharmacy. All other medications will remain the same. Your physician recommends that you return for lab work around December 12, 2011 at [facility] for BMET/BNP and LFT's. Please have this done just before your next appointment.

## 2011-11-21 NOTE — Assessment & Plan Note (Signed)
Brief episodes noted on device interrogation by Dr. Johney Frame back in August. Anticoagulation was not initiated at that time. She reports no palpitations.

## 2011-11-21 NOTE — Assessment & Plan Note (Signed)
Described as trivial by echocardiogram in August.

## 2011-11-21 NOTE — Assessment & Plan Note (Signed)
Meeting patient for first time today in clinic. I reviewed her records. Difficult to get a sense of her symptoms long-term, although she seems to be more symptomatic within the last few months in terms of shortness of breath and fatigue. She is chronically hypotensive based on chart review despite relatively low-dose medications. Weight is up 3 pounds. Coreg was increased most recently, and I would like to cut this back to 9.375 mg twice daily for the time being. We did discuss as needed intensification of Lasix based on her weight, and her lab work shows normal renal function as of August. Willt have her come in within the next few weeks with a BMET, BNP, and also liver function tests. I am concerned that if she continues to clinically deteriorate, he may need to consider further evaluation such as right heart catheterization and possibly inotropes.

## 2011-12-09 ENCOUNTER — Encounter: Payer: Self-pay | Admitting: Cardiology

## 2011-12-09 ENCOUNTER — Ambulatory Visit (INDEPENDENT_AMBULATORY_CARE_PROVIDER_SITE_OTHER): Payer: Medicare Other | Admitting: Cardiology

## 2011-12-09 VITALS — BP 98/62 | HR 60 | Ht 64.0 in | Wt 212.1 lb

## 2011-12-09 DIAGNOSIS — I5022 Chronic systolic (congestive) heart failure: Secondary | ICD-10-CM

## 2011-12-09 DIAGNOSIS — R0602 Shortness of breath: Secondary | ICD-10-CM

## 2011-12-09 DIAGNOSIS — I428 Other cardiomyopathies: Secondary | ICD-10-CM

## 2011-12-09 DIAGNOSIS — I4891 Unspecified atrial fibrillation: Secondary | ICD-10-CM

## 2011-12-09 NOTE — Patient Instructions (Addendum)
Continue all current medications. Labs - do just prior to next office visit - BMET, BNP Follow up in  3 months

## 2011-12-09 NOTE — Assessment & Plan Note (Signed)
LVEF up to the range of 35-40% by most recent evaluation. 

## 2011-12-09 NOTE — Assessment & Plan Note (Signed)
Very brief episodes noted on device interrogation by Dr. Johney Frame. Continue to monitor. No anticoagulation at this point.

## 2011-12-09 NOTE — Assessment & Plan Note (Signed)
No progressive symptoms since last visit, weight is down 3 pounds. Followup lab work actually looks good in terms of renal function, electrolytes, LFTs, and BNP. Continue present regimen. Followup arranged with BMET and BNP.

## 2011-12-09 NOTE — Assessment & Plan Note (Signed)
Chronic, limits medical therapy.

## 2011-12-09 NOTE — Progress Notes (Signed)
Clinical Summary Melissa Paul is a 66 y.o.female presenting for followup. She was seen recently in late October. She reports no progressive shortness of breath since last visit. Her weight is down 3 pounds.  Followup lab work on 11/15 showed BUN 10, creatinine 0.8, AST 18, ALT 16, sodium 138, potassium 3.9, BNP 100. We reviewed this today.  At this point plan is to continue current regimen. Encouraged regular walking as much as she can tolerate. She seems to be doing well from a volume perspective, no progressive symptoms. Remains under a lot of stress caring for her ailing husband.   Allergies  Allergen Reactions  . Bactrim   . Cephalexin   . Clindamycin   . Contrast Media (Iodinated Diagnostic Agents)   . Latex   . Levofloxacin   . Nitrofurantoin Monohyd Macro   . Penicillins     Current Outpatient Prescriptions  Medication Sig Dispense Refill  . aspirin 81 MG tablet Take 1 tablet by mouth daily.      . carvedilol (COREG) 3.125 MG tablet Take 1 tablet (3.125 mg total) by mouth 2 (two) times daily with a meal. With 6.25 mg  60 tablet  3  . carvedilol (COREG) 6.25 MG tablet Take 1 tablet (6.25 mg total) by mouth 2 (two) times daily with a meal. With 3.125 mg  60 tablet  3  . cetirizine (ZYRTEC) 10 MG tablet Take 10 mg by mouth daily.       . diazepam (VALIUM) 2 MG tablet Take 2 mg by mouth every 12 (twelve) hours as needed.       . digoxin (LANOXIN) 0.125 MG tablet Take 1 tablet by mouth daily.       . furosemide (LASIX) 40 MG tablet Take 80 mg by mouth daily.      Marland Kitchen glipiZIDE (GLUCOTROL XL) 2.5 MG 24 hr tablet Take 2.5 mg by mouth daily.        Marland Kitchen levothyroxine (SYNTHROID, LEVOTHROID) 75 MCG tablet Take 1/2 tablet by mouth daily.        Marland Kitchen losartan (COZAAR) 25 MG tablet Take 1/2 tab (12.5mg ) daily      . polyethylene glycol (MIRALAX / GLYCOLAX) packet Use daily as directed.       . ranitidine (ZANTAC) 150 MG capsule Take 150 mg by mouth daily.       Marland Kitchen spironolactone (ALDACTONE) 25  MG tablet Take 1 tablet by mouth daily.         Past Medical History  Diagnosis Date  . Nonischemic cardiomyopathy     LVEF 35-40% by echo 8/13  . Chronic systolic heart failure   . LBBB (left bundle branch block)   . Hypothyroidism   . UTI (urinary tract infection)     Recurrent  . Glucose intolerance (pre-diabetes)   . Hyperlipemia   . Mitral regurgitation     Mild to Moderate  . Paroxysmal atrial fibrillation     Brief episodes by device interrogation    Social History Ms. Sinn reports that she quit smoking about 15 years ago. Her smoking use included Cigarettes. She has a 6 pack-year smoking history. She has never used smokeless tobacco. Ms. Sandoval reports that she does not drink alcohol.  Review of Systems No palpitations. No bleeding episodes. No PND or pitting edema.  Physical Examination Filed Vitals:   12/09/11 0816  BP: 98/62  Pulse: 60   Filed Weights   12/09/11 0816  Weight: 212 lb 1.9 oz (96.217 kg)   Patient  in no acute distress.  HEENT: Conjunctiva and lids normal, oropharynx clear.  Neck: Supple, no elevated JVP, no carotid bruits, no thyromegaly.  Lungs: Clear to auscultation, nonlabored breathing at rest.  Cardiac: Regular rate and rhythm, no S3 or significant systolic murmur, no pericardial rub.  Abdomen: Soft, nontender, bowel sounds present, no guarding or rebound.  Extremities: Trace edema.    Problem List and Plan   CHRONIC SYSTOLIC HEART FAILURE No progressive symptoms since last visit, weight is down 3 pounds. Followup lab work actually looks good in terms of renal function, electrolytes, LFTs, and BNP. Continue present regimen. Followup arranged with BMET and BNP.  CARDIOMYOPATHY, DILATED LVEF up to the range of 35-40% by most recent evaluation.  Hypotension Chronic, limits medical therapy.  Atrial fibrillation Very brief episodes noted on device interrogation by Dr. Johney Frame. Continue to monitor. No anticoagulation at this  point.    [name], M.D., F.A.C.C.

## 2012-01-13 ENCOUNTER — Encounter: Payer: Medicare Other | Admitting: *Deleted

## 2012-01-14 ENCOUNTER — Encounter: Payer: Self-pay | Admitting: *Deleted

## 2012-01-21 ENCOUNTER — Telehealth: Payer: Self-pay | Admitting: Internal Medicine

## 2012-01-21 NOTE — Telephone Encounter (Signed)
Pt having trouble with [employer] transmission going thru. Advised pt to unplug transmitter and then try to resend. If doesn't go thru at that time will need to call [employer]. Pt aware.

## 2012-01-21 NOTE — Telephone Encounter (Signed)
Pt would like speak [facility] regarding possible issues with remote transmission. Pt can be reached at hM#

## 2012-03-13 ENCOUNTER — Encounter: Payer: Self-pay | Admitting: Cardiology

## 2012-03-13 ENCOUNTER — Ambulatory Visit (INDEPENDENT_AMBULATORY_CARE_PROVIDER_SITE_OTHER): Payer: Medicare Other | Admitting: Cardiology

## 2012-03-13 VITALS — BP 95/56 | HR 59 | Ht 64.0 in | Wt 212.0 lb

## 2012-03-13 DIAGNOSIS — I080 Rheumatic disorders of both mitral and aortic valves: Secondary | ICD-10-CM

## 2012-03-13 DIAGNOSIS — I428 Other cardiomyopathies: Secondary | ICD-10-CM

## 2012-03-13 DIAGNOSIS — I5022 Chronic systolic (congestive) heart failure: Secondary | ICD-10-CM

## 2012-03-13 DIAGNOSIS — Z95810 Presence of automatic (implantable) cardiac defibrillator: Secondary | ICD-10-CM

## 2012-03-13 NOTE — Assessment & Plan Note (Signed)
Chronic. 

## 2012-03-13 NOTE — Assessment & Plan Note (Signed)
Plan to continue current regimen, limited by relative hypotension which he tolerates fairly well. We did discuss using additional Lasix for increases in weight or edema. Recent renal function normal, BNP only mildly increased. Followup arranged.

## 2012-03-13 NOTE — Assessment & Plan Note (Signed)
Nonischemic, LVEF 40%.

## 2012-03-13 NOTE — Assessment & Plan Note (Signed)
Mild to moderate 

## 2012-03-13 NOTE — Patient Instructions (Addendum)
Your physician recommends that you schedule a follow-up appointment in: 3 months. Your physician recommends that you continue on your current medications as directed. Please refer to the Current Medication list given to you today. 

## 2012-03-13 NOTE — Assessment & Plan Note (Signed)
Followed by Dr. Allred. 

## 2012-03-13 NOTE — Progress Notes (Signed)
Clinical Summary Melissa Paul is a 67 y.o.female presenting for followup. She was in November 2013. Reports no change in chronic dyspnea, some congestion in her chest, no angina symptoms. She has had no palpitations or syncope. Reports compliance with her medications. States that she was recently diagnosed with B12 deficiency by Dr..Tapper.  Recent lab work shows BUN 13, creatinine 0.8, potassium 4.0, BUN 191. Weight is stable compared to November visit, although she feels like her weight is up based on her scales. Relative hypotension is chronic and limits medical therapy to some degree. We did discuss when necessary use of additional Lasix for weight changes or edema.   Allergies  Allergen Reactions  . Bactrim   . Cephalexin   . Clindamycin   . Contrast Media (Iodinated Diagnostic Agents)   . Latex   . Levofloxacin   . Nitrofurantoin Monohyd Macro   . Penicillins     Current Outpatient Prescriptions  Medication Sig Dispense Refill  . aspirin 81 MG tablet Take 1 tablet by mouth daily.      . carvedilol (COREG) 3.125 MG tablet Take 1 tablet (3.125 mg total) by mouth 2 (two) times daily with a meal. With 6.25 mg  60 tablet  3  . carvedilol (COREG) 6.25 MG tablet Take 1 tablet (6.25 mg total) by mouth 2 (two) times daily with a meal. With 3.125 mg  60 tablet  3  . cetirizine (ZYRTEC) 10 MG tablet Take 10 mg by mouth daily.       . diazepam (VALIUM) 2 MG tablet Take 2 mg by mouth every 12 (twelve) hours as needed.       . digoxin (LANOXIN) 0.125 MG tablet Take 1 tablet by mouth daily.       . furosemide (LASIX) 40 MG tablet Take 80 mg by mouth daily.      Marland Kitchen glipiZIDE (GLUCOTROL XL) 2.5 MG 24 hr tablet Take 2.5 mg by mouth daily.        Marland Kitchen levothyroxine (SYNTHROID, LEVOTHROID) 75 MCG tablet Take 1/2 tablet by mouth daily.        Marland Kitchen losartan (COZAAR) 25 MG tablet Take 1/2 tab (12.5mg ) daily      . polyethylene glycol (MIRALAX / GLYCOLAX) packet Use daily as directed.       . ranitidine  (ZANTAC) 150 MG capsule Take 150 mg by mouth daily.       Marland Kitchen spironolactone (ALDACTONE) 25 MG tablet Take 1 tablet by mouth daily.        No current facility-administered medications for this visit.    Past Medical History  Diagnosis Date  . Nonischemic cardiomyopathy     LVEF 35-40% by echo 8/13  . Chronic systolic heart failure   . LBBB (left bundle branch block)   . Hypothyroidism   . UTI (urinary tract infection)     Recurrent  . Glucose intolerance (pre-diabetes)   . Hyperlipemia   . Mitral regurgitation     Mild to Moderate  . Paroxysmal atrial fibrillation     Brief episodes by device interrogation    Social History Ms. Amaker reports that she quit smoking about 16 years ago. Her smoking use included Cigarettes. She has a 6 pack-year smoking history. She has never used smokeless tobacco. Ms. Odonohue reports that she does not drink alcohol.  Review of Systems Negative except as outlined.  Physical Examination Filed Vitals:   03/13/12 1303  BP: 95/56  Pulse: 59   Filed Weights   03/13/12 1255  Weight: 212 lb (96.163 kg)    Patient in no acute distress.  HEENT: Conjunctiva and lids normal, oropharynx clear.  Neck: Supple, no elevated JVP, no carotid bruits, no thyromegaly.  Lungs: Clear to auscultation, nonlabored breathing at rest.  Cardiac: Regular rate and rhythm, no S3 or significant systolic murmur, no pericardial rub.  Abdomen: Soft, nontender, bowel sounds present, no guarding or rebound.  Extremities: 1+ edema and stasis.    Problem List and Plan   CHRONIC SYSTOLIC HEART FAILURE Plan to continue current regimen, limited by relative hypotension which he tolerates fairly well. We did discuss using additional Lasix for increases in weight or edema. Recent renal function normal, BNP only mildly increased. Followup arranged.  CARDIOMYOPATHY, DILATED Nonischemic, LVEF 40%.  Hypotension Chronic.  ICD (implantable cardiac defibrillator) in  place Followed by Dr. Johney Frame.  MITRAL REGURGITATION Mild to moderate.    [name], M.D., F.A.C.C.

## 2012-03-16 ENCOUNTER — Telehealth: Payer: Self-pay | Admitting: *Deleted

## 2012-03-16 NOTE — Telephone Encounter (Signed)
Patient informed. 

## 2012-03-16 NOTE — Telephone Encounter (Signed)
Message copied by Eustace Moore on Mon Mar 16, 2012  3:38 PM ------      Message from: MCDOWELL, Illene Bolus      Created: Thu Mar 12, 2012  9:25 AM       Reviewed. Renal function and potassium remain normal. BNP only 191. Can review further at followup. ------

## 2012-03-26 ENCOUNTER — Other Ambulatory Visit: Payer: Self-pay | Admitting: Cardiology

## 2012-03-26 MED ORDER — FUROSEMIDE 40 MG PO TABS: 80.0000 mg | ORAL_TABLET | Freq: Every day | ORAL | Status: DC

## 2012-04-08 ENCOUNTER — Encounter: Payer: Medicare Other | Admitting: Internal Medicine

## 2012-04-08 ENCOUNTER — Encounter: Payer: Self-pay | Admitting: Internal Medicine

## 2012-04-08 ENCOUNTER — Ambulatory Visit (INDEPENDENT_AMBULATORY_CARE_PROVIDER_SITE_OTHER): Payer: Medicare Other | Admitting: Internal Medicine

## 2012-04-08 VITALS — BP 107/67 | HR 61 | Ht 64.0 in | Wt 212.1 lb

## 2012-04-08 DIAGNOSIS — I4891 Unspecified atrial fibrillation: Secondary | ICD-10-CM

## 2012-04-08 DIAGNOSIS — I5022 Chronic systolic (congestive) heart failure: Secondary | ICD-10-CM

## 2012-04-08 DIAGNOSIS — I428 Other cardiomyopathies: Secondary | ICD-10-CM

## 2012-04-08 LAB — ICD DEVICE OBSERVATION
AL AMPLITUDE: 5 mv
AL IMPEDENCE ICD: 525 Ohm
AL THRESHOLD: 0.75 V
ATRIAL PACING ICD: 71 pct
BAMS-0001: 170 {beats}/min
BAMS-0003: 70 {beats}/min
CHARGE TIME: 9.4 s
DEVICE MODEL ICD: 794739
FVT: 0
HV IMPEDENCE: 79 Ohm
LV LEAD IMPEDENCE ICD: 537.5 Ohm
LV LEAD THRESHOLD: 0.375 V
MODE SWITCH EPISODES: 171
PACEART VT: 0
RV LEAD AMPLITUDE: 12 mv
RV LEAD IMPEDENCE ICD: 587.5 Ohm
RV LEAD THRESHOLD: 0.75 V
TOT-0006: 20120710000000
TOT-0007: 1
TOT-0008: 0
TOT-0009: 1
TOT-0010: 9
TZON-0003SLOWVT: 340 ms
TZON-0004SLOWVT: 20
TZON-0005SLOWVT: 6
TZON-0010SLOWVT: 40 ms
VENTRICULAR PACING ICD: 93 pct
VF: 0

## 2012-04-08 NOTE — Progress Notes (Signed)
PCP: Louie Boston, MD Primary Cardiologist:  Dr Diona Browner  The patient presents today for routine electrophysiology followup.  Since last being seen in our clinic, the patient reports doing reasonably well.  She has chronic but mild discomfort around her defibrillator pocket.  This really hasnt changed.  She recently saw Dr Diona Browner with volume overload and her lasix was increased.  She has done better since that time.  Today, she denies symptoms of palpitations, chest pain, orthopnea, PND, presyncope, syncope, or neurologic sequela.  Her edema is improving with recently increased lasix.  The patient feels that she is tolerating medications without difficulties and is otherwise without complaint today.   Past Medical History  Diagnosis Date  . Nonischemic cardiomyopathy     LVEF 35-40% by echo 8/13  . Chronic systolic heart failure   . LBBB (left bundle branch block)   . Hypothyroidism   . UTI (urinary tract infection)     Recurrent  . Glucose intolerance (pre-diabetes)   . Hyperlipemia   . Mitral regurgitation     Mild to Moderate  . Paroxysmal atrial fibrillation     Brief episodes by device interrogation   Past Surgical History  Procedure Laterality Date  . Cholecystectomy    . Total abdominal hysterectomy      (R) ovary removed  . Appendectomy    . Knee arthroscopy      Left  . Cardiac defibrillator placement  09/2009    St. Jude BiV ICD by Dr. Shanna Cisco class III    Current Outpatient Prescriptions  Medication Sig Dispense Refill  . aspirin 81 MG tablet Take 1 tablet by mouth daily.      . carvedilol (COREG) 3.125 MG tablet Take 1 tablet (3.125 mg total) by mouth 2 (two) times daily with a meal. With 6.25 mg  60 tablet  3  . carvedilol (COREG) 6.25 MG tablet Take 1 tablet (6.25 mg total) by mouth 2 (two) times daily with a meal. With 3.125 mg  60 tablet  3  . cetirizine (ZYRTEC) 10 MG tablet Take 10 mg by mouth daily.       . diazepam (VALIUM) 2 MG tablet Take 2 mg by  mouth every 12 (twelve) hours as needed.       . digoxin (LANOXIN) 0.125 MG tablet Take 1 tablet by mouth daily.       . folic acid (FOLVITE) 800 MCG tablet Take 400 mcg by mouth daily.      . furosemide (LASIX) 40 MG tablet Take 2 tablets (80 mg total) by mouth daily.  60 tablet  3  . glipiZIDE (GLUCOTROL XL) 2.5 MG 24 hr tablet Take 2.5 mg by mouth daily.        Marland Kitchen levothyroxine (SYNTHROID, LEVOTHROID) 75 MCG tablet Take 1/2 tablet by mouth daily.        Marland Kitchen losartan (COZAAR) 25 MG tablet Take 1/2 tab (12.5mg ) daily      . polyethylene glycol (MIRALAX / GLYCOLAX) packet Use daily as directed.       . ranitidine (ZANTAC) 150 MG capsule Take 150 mg by mouth daily.       Marland Kitchen spironolactone (ALDACTONE) 25 MG tablet Take 1 tablet by mouth daily.       . vitamin B-12 (CYANOCOBALAMIN) 1000 MCG tablet Take 1,000 mcg by mouth daily.       No current facility-administered medications for this visit.    Allergies  Allergen Reactions  . Bactrim   . Cephalexin   .  Clindamycin   . Contrast Media (Iodinated Diagnostic Agents)   . Latex   . Levofloxacin   . Nitrofurantoin Monohyd Macro   . Penicillins     History   Social History  . Marital Status: Married    Spouse Name: N/A    Number of Children: N/A  . Years of Education: N/A   Occupational History  . Not on file.   Social History Main Topics  . Smoking status: Former Smoker -- 0.30 packs/day for 20 years    Types: Cigarettes    Quit date: 01/22/1996  . Smokeless tobacco: Never Used  . Alcohol Use: No  . Drug Use: No  . Sexually Active: Not on file   Other Topics Concern  . Not on file   Social History Narrative  . No narrative on file    Family History  Problem Relation Age of Onset  . Diabetes Neg Hx   . Hypertension Neg Hx   . Coronary artery disease Neg Hx      Physical Exam: Filed Vitals:   04/08/12 1110  BP: 107/67  Pulse: 61  Height: 5\' 4"  (1.626 m)  Weight: 212 lb 1.9 oz (96.217 kg)    GEN- The patient  is overweight appearing, alert and oriented x 3 today.   Head- normocephalic, atraumatic Eyes-  Sclera clear, conjunctiva pink Ears- hearing intact Oropharynx- clear Neck- supple, no JVP Lymph- no cervical lymphadenopathy Lungs- Clear to ausculation bilaterally, normal work of breathing Chest- ICD pocket is well healed Heart- Regular rate and rhythm, no murmurs, rubs or gallops, PMI not laterally displaced GI- soft, NT, ND, + BS Extremities- no clubbing, cyanosis, trace edema  ICD interrogation- reviewed in detail today,  See PACEART report  Assessment and Plan:   1. Chronic systolic dysfunction Improving with recent increase in lasix She will follow closely with Dr Diona Browner 2 gram sodium diet encouraged  Normal BiV ICD function See Pace Art report Only 93% BIV paced.  I have shorted her AV delay today to 120/120 (from170/120) as her intrinsic AV conduction appears to be today. IF CHF continues to worsen, we could consider echo guided AV and VV optimization.  Return to the device clinic in 6 months Merlin checks every 3 months

## 2012-04-08 NOTE — Patient Instructions (Signed)
Continue all current medications. 6 months - Allred  

## 2012-04-28 ENCOUNTER — Encounter: Payer: Self-pay | Admitting: Cardiology

## 2012-06-16 ENCOUNTER — Ambulatory Visit (INDEPENDENT_AMBULATORY_CARE_PROVIDER_SITE_OTHER): Payer: Medicare Other | Admitting: Cardiology

## 2012-06-16 ENCOUNTER — Encounter: Payer: Self-pay | Admitting: Cardiology

## 2012-06-16 VITALS — BP 88/49 | HR 61 | Ht 64.0 in | Wt 210.8 lb

## 2012-06-16 DIAGNOSIS — Z95810 Presence of automatic (implantable) cardiac defibrillator: Secondary | ICD-10-CM

## 2012-06-16 DIAGNOSIS — I5022 Chronic systolic (congestive) heart failure: Secondary | ICD-10-CM

## 2012-06-16 DIAGNOSIS — I428 Other cardiomyopathies: Secondary | ICD-10-CM

## 2012-06-16 DIAGNOSIS — I9589 Other hypotension: Secondary | ICD-10-CM

## 2012-06-16 DIAGNOSIS — I080 Rheumatic disorders of both mitral and aortic valves: Secondary | ICD-10-CM

## 2012-06-16 DIAGNOSIS — I429 Cardiomyopathy, unspecified: Secondary | ICD-10-CM

## 2012-06-16 NOTE — Assessment & Plan Note (Signed)
Stable symptomatology, nonischemic cardiomyopathy with LVEF 35-40%. Continue current medications. I reminded her about appropriate fluid and sodium restriction. Her activity is limited by chronic knee pain.

## 2012-06-16 NOTE — Patient Instructions (Addendum)
Your physician recommends that you schedule a follow-up appointment in: 3 months. Your physician recommends that you continue on your current medications as directed. Please refer to the Current Medication list given to you today. 

## 2012-06-16 NOTE — Assessment & Plan Note (Signed)
No device discharges. Continue followup with Dr. Johney Frame.

## 2012-06-16 NOTE — Assessment & Plan Note (Signed)
This limits further advancing medical therapy.

## 2012-06-16 NOTE — Progress Notes (Signed)
Clinical Summary Melissa Paul is a 67 y.o.female last seen in February. Interval visit with Dr. Johney Frame noted in March. She reports stable shortness of breath, has had intermittent worsening leg edema, but her weight is actually down a few pounds from the last visit.  Last echocardiogram in August 2013 revealed LVEF 35-40% with normal filling pressures, diastolic dysfunction.  ECG today shows a ventricular paced rhythm.  Remains relatively hypotensive on a chronic basis, no marked dizziness or syncope. This does limit further optimizing medical therapy. She continues to follow with Dr. Margo Common.  Allergies  Allergen Reactions  . Bactrim   . Cephalexin   . Clindamycin   . Contrast Media (Iodinated Diagnostic Agents)   . Latex   . Levofloxacin   . Nitrofurantoin Monohyd Macro   . Penicillins     Current Outpatient Prescriptions  Medication Sig Dispense Refill  . aspirin 81 MG tablet Take 1 tablet by mouth daily.      . carvedilol (COREG) 3.125 MG tablet Take 1 tablet (3.125 mg total) by mouth 2 (two) times daily with a meal. With 6.25 mg  60 tablet  3  . carvedilol (COREG) 6.25 MG tablet Take 6.25 mg by mouth 2 (two) times daily with a meal. With 3.125mg  tab      . cetirizine (ZYRTEC) 10 MG tablet Take 10 mg by mouth daily.       . diazepam (VALIUM) 2 MG tablet Take 2 mg by mouth every 12 (twelve) hours as needed.       . digoxin (LANOXIN) 0.125 MG tablet Take 1 tablet by mouth daily.       . furosemide (LASIX) 40 MG tablet Take 2 tablets (80 mg total) by mouth daily.  60 tablet  3  . glipiZIDE (GLUCOTROL XL) 2.5 MG 24 hr tablet Take 2.5 mg by mouth daily.        Marland Kitchen levothyroxine (SYNTHROID, LEVOTHROID) 75 MCG tablet Take 1/2 tablet by mouth daily.        Marland Kitchen losartan (COZAAR) 25 MG tablet Take 1/2 tab (12.5mg ) daily      . polyethylene glycol (MIRALAX / GLYCOLAX) packet Use daily as directed.       . ranitidine (ZANTAC) 150 MG capsule Take 150 mg by mouth daily.       Marland Kitchen spironolactone  (ALDACTONE) 25 MG tablet Take 1 tablet by mouth daily.       . folic acid (FOLVITE) 800 MCG tablet Take 400 mcg by mouth daily.       No current facility-administered medications for this visit.    Past Medical History  Diagnosis Date  . Nonischemic cardiomyopathy     LVEF 35-40% by echo 8/13  . Chronic systolic heart failure   . LBBB (left bundle branch block)   . Hypothyroidism   . UTI (urinary tract infection)     Recurrent  . Glucose intolerance (pre-diabetes)   . Hyperlipemia   . Mitral regurgitation     Mild to Moderate  . Paroxysmal atrial fibrillation     Brief episodes by device interrogation    Social History Ms. Fisher reports that she quit smoking about 16 years ago. Her smoking use included Cigarettes. She has a 6 pack-year smoking history. She has never used smokeless tobacco. Ms. Robeck reports that she does not drink alcohol.  Review of Systems Chronic knee pain, status post recent left knee joint injection. Otherwise negative.  Physical Examination Filed Vitals:   06/16/12 1329  BP: 88/49  Pulse: 61   Filed Weights   06/16/12 1329  Weight: 210 lb 12.8 oz (95.618 kg)    Patient in no acute distress.  HEENT: Conjunctiva and lids normal, oropharynx clear.  Neck: Supple, no elevated JVP, no carotid bruits, no thyromegaly.  Lungs: Clear to auscultation, nonlabored breathing at rest.  Cardiac: Regular rate and rhythm, no S3 or significant systolic murmur, no pericardial rub.  Abdomen: Soft, nontender, bowel sounds present, no guarding or rebound.  Extremities: 1+ edema and stasis.    Problem List and Plan   CHRONIC SYSTOLIC HEART FAILURE Stable symptomatology, nonischemic cardiomyopathy with LVEF 35-40%. Continue current medications. I reminded her about appropriate fluid and sodium restriction. Her activity is limited by chronic knee pain.  ICD (implantable cardiac defibrillator) in place No device discharges. Continue followup with Dr.  Johney Frame.  Chronic hypotension This limits further advancing medical therapy.  MITRAL REGURGITATION Mild to moderate.    [name], M.D., F.A.C.C.

## 2012-06-16 NOTE — Assessment & Plan Note (Signed)
Mild to moderate 

## 2012-06-26 ENCOUNTER — Other Ambulatory Visit: Payer: Self-pay | Admitting: *Deleted

## 2012-06-26 MED ORDER — CARVEDILOL 3.125 MG PO TABS: 3.1250 mg | ORAL_TABLET | Freq: Two times a day (BID) | ORAL | Status: DC

## 2012-06-26 MED ORDER — CARVEDILOL 6.25 MG PO TABS: 6.2500 mg | ORAL_TABLET | Freq: Two times a day (BID) | ORAL | Status: DC

## 2012-07-13 ENCOUNTER — Ambulatory Visit (INDEPENDENT_AMBULATORY_CARE_PROVIDER_SITE_OTHER): Payer: Medicare Other | Admitting: *Deleted

## 2012-07-13 ENCOUNTER — Encounter: Payer: Self-pay | Admitting: Internal Medicine

## 2012-07-13 DIAGNOSIS — I5022 Chronic systolic (congestive) heart failure: Secondary | ICD-10-CM

## 2012-07-13 DIAGNOSIS — I428 Other cardiomyopathies: Secondary | ICD-10-CM

## 2012-07-13 DIAGNOSIS — Z95810 Presence of automatic (implantable) cardiac defibrillator: Secondary | ICD-10-CM

## 2012-07-13 LAB — REMOTE ICD DEVICE
AL AMPLITUDE: 5 mv
AL IMPEDENCE ICD: 450 Ohm
ATRIAL PACING ICD: 74 pct
BAMS-0001: 170 {beats}/min
BAMS-0003: 70 {beats}/min
DEVICE MODEL ICD: 794739
HV IMPEDENCE: 80 Ohm
LV LEAD IMPEDENCE ICD: 530 Ohm
LV LEAD THRESHOLD: 0.5 V
RV LEAD AMPLITUDE: 12 mv
RV LEAD IMPEDENCE ICD: 530 Ohm
RV LEAD THRESHOLD: 0.75 V
TZON-0003SLOWVT: 340 ms
TZON-0004SLOWVT: 20
TZON-0005SLOWVT: 6
TZON-0010SLOWVT: 40 ms
VENTRICULAR PACING ICD: 94 pct

## 2012-07-22 ENCOUNTER — Telehealth: Payer: Self-pay | Admitting: Internal Medicine

## 2012-07-22 NOTE — Telephone Encounter (Signed)
Instructions given to patient to call [email or web address] to reset her transmitter.

## 2012-07-22 NOTE — Telephone Encounter (Signed)
New Prob ° ° ° ° °Pt has some questions regarding her device. Please call. °

## 2012-07-28 ENCOUNTER — Encounter: Payer: Self-pay | Admitting: *Deleted

## 2012-08-24 ENCOUNTER — Other Ambulatory Visit: Payer: Self-pay | Admitting: Cardiology

## 2012-09-16 ENCOUNTER — Encounter: Payer: Self-pay | Admitting: Cardiology

## 2012-09-16 ENCOUNTER — Ambulatory Visit (INDEPENDENT_AMBULATORY_CARE_PROVIDER_SITE_OTHER): Payer: Medicare Other | Admitting: Cardiology

## 2012-09-16 VITALS — BP 96/67 | HR 73 | Ht 64.0 in | Wt 210.0 lb

## 2012-09-16 DIAGNOSIS — I4891 Unspecified atrial fibrillation: Secondary | ICD-10-CM

## 2012-09-16 MED ORDER — CARVEDILOL 12.5 MG PO TABS: 12.5000 mg | ORAL_TABLET | Freq: Two times a day (BID) | ORAL | Status: DC

## 2012-09-16 NOTE — Assessment & Plan Note (Signed)
Keep followup with Dr. Allred. 

## 2012-09-16 NOTE — Assessment & Plan Note (Signed)
Will have her take an extra dose of Lasix for the next 3 days, follow weights. Also planning to increase Coreg to 12.5 mg twice daily. Followup arranged.

## 2012-09-16 NOTE — Assessment & Plan Note (Signed)
Brief episodes intermittently noted, 15 mode switches on most recent device check. I did discuss anticoagulation with her as detailed above. She declines. I asked her to reconsider this and speak with Dr. Johney Frame at the next visit.

## 2012-09-16 NOTE — Patient Instructions (Addendum)
Your physician recommends that you schedule a follow-up appointment in: 3 months. Your physician has recommended you make the following change in your medication: Increase your carvedilol to 12.5 mg twice daily. You may take (2) of your 6.25 mg tablets twice daily until they are finished. Please take an extra furosemide tablet for the next 3 days, then; resume your previous dosage. Your new prescription have been sent to your pharmacy. All other medications will remain the same.

## 2012-09-16 NOTE — Assessment & Plan Note (Signed)
LVEF 35-40%.

## 2012-09-16 NOTE — Progress Notes (Signed)
Clinical Summary Melissa Paul is a 67 y.o.female last seen in May of this year. She complains of fatigue, feeling of tightness when she breathes. States that her weight is up 5 pounds although it is stable by our scales. She has not been using PRN Lasix. Also tells me that she was feeling better on higher dose Coreg.  Interval device check with Dr. Johney Frame noted. No ventricular arrhythmias, biventricular pacing the vast majority of the time. 15 mode switches noted. She has had brief episodes of atrial fibrillation documented previously by Dr. Johney Frame. Has not been anticoagulated as yet. I discussed this issue with her today and she declined considering anticoagulation, even an NOAC such as Eliquis.  Last echocardiogram in August 2013 revealed LVEF 35-40% with normal filling pressures, diastolic dysfunction.  Recent lab work in July showed BUN 10, creatinine 0.8, sodium 136, potassium 3.5, hemoglobin 14.0.   Allergies  Allergen Reactions  . Bactrim   . Cephalexin   . Clindamycin   . Contrast Media [Iodinated Diagnostic Agents]   . Latex   . Levofloxacin   . Nitrofurantoin Monohyd Macro   . Penicillins     Current Outpatient Prescriptions  Medication Sig Dispense Refill  . aspirin 81 MG tablet Take 1 tablet by mouth daily.      . cetirizine (ZYRTEC) 10 MG tablet Take 10 mg by mouth daily.       . diazepam (VALIUM) 2 MG tablet Take 2 mg by mouth every 12 (twelve) hours as needed.       . digoxin (LANOXIN) 0.125 MG tablet Take 1 tablet by mouth daily.       . furosemide (LASIX) 40 MG tablet TAKE TWO TABLETS BY MOUTH EVERY DAY  60 tablet  3  . glipiZIDE (GLUCOTROL XL) 2.5 MG 24 hr tablet Take 2.5 mg by mouth daily.        Marland Kitchen levothyroxine (SYNTHROID, LEVOTHROID) 75 MCG tablet Take 1/2 tablet by mouth daily.        Marland Kitchen losartan (COZAAR) 25 MG tablet Take 1/2 tab (12.5mg ) daily      . polyethylene glycol (MIRALAX / GLYCOLAX) packet Use daily as directed.       . ranitidine (ZANTAC) 150 MG  capsule Take 150 mg by mouth daily.       Marland Kitchen spironolactone (ALDACTONE) 25 MG tablet Take 1 tablet by mouth daily.       . carvedilol (COREG) 12.5 MG tablet Take 1 tablet (12.5 mg total) by mouth 2 (two) times daily.  180 tablet  3   No current facility-administered medications for this visit.    Past Medical History  Diagnosis Date  . Nonischemic cardiomyopathy     LVEF 35-40% by echo 8/13  . Chronic systolic heart failure   . LBBB (left bundle branch block)   . Hypothyroidism   . UTI (urinary tract infection)     Recurrent  . Glucose intolerance (pre-diabetes)   . Hyperlipemia   . Mitral regurgitation     Mild to Moderate  . Paroxysmal atrial fibrillation     Brief episodes by device interrogation    Social History Melissa Paul reports that she quit smoking about 16 years ago. Her smoking use included Cigarettes. She has a 6 pack-year smoking history. She has never used smokeless tobacco. Melissa Paul reports that she does not drink alcohol.  Review of Systems No palpitations or syncope. Appetite is fair. No orthopnea.  Physical Examination Filed Vitals:   09/16/12 1101  BP:  96/67  Pulse: 73   Filed Weights   09/16/12 1101  Weight: 210 lb (95.255 kg)   Patient in no acute distress.  HEENT: Conjunctiva and lids normal, oropharynx clear.  Neck: Supple, no elevated JVP, no carotid bruits, no thyromegaly.  Lungs: Clear to auscultation, nonlabored breathing at rest.  Cardiac: Regular rate and rhythm, no S3 or significant systolic murmur, no pericardial rub.  Abdomen: Soft, nontender, bowel sounds present, no guarding or rebound.  Extremities: 1+ edema and stasis.    Problem List and Plan   Chronic systolic heart failure Will have her take an extra dose of Lasix for the next 3 days, follow weights. Also planning to increase Coreg to 12.5 mg twice daily. Followup arranged.  CARDIOMYOPATHY, DILATED LVEF 35-40%.  ICD (implantable cardiac defibrillator) in place Keep  followup with Dr. Johney Frame.  Atrial fibrillation Brief episodes intermittently noted, 15 mode switches on most recent device check. I did discuss anticoagulation with her as detailed above. She declines. I asked her to reconsider this and speak with Dr. Johney Frame at the next visit.    [name], M.D., F.A.C.C.

## 2012-10-09 ENCOUNTER — Encounter: Payer: Medicare Other | Admitting: Internal Medicine

## 2012-10-22 ENCOUNTER — Other Ambulatory Visit: Payer: Self-pay | Admitting: Cardiology

## 2012-10-22 MED ORDER — LOSARTAN POTASSIUM 25 MG PO TABS: ORAL_TABLET | ORAL | Status: DC

## 2012-10-23 ENCOUNTER — Encounter: Payer: Self-pay | Admitting: Internal Medicine

## 2012-10-23 ENCOUNTER — Ambulatory Visit (INDEPENDENT_AMBULATORY_CARE_PROVIDER_SITE_OTHER): Payer: Medicare Other | Admitting: Internal Medicine

## 2012-10-23 VITALS — BP 84/54 | HR 62 | Ht 64.0 in | Wt 207.0 lb

## 2012-10-23 DIAGNOSIS — Z95810 Presence of automatic (implantable) cardiac defibrillator: Secondary | ICD-10-CM

## 2012-10-23 DIAGNOSIS — I5022 Chronic systolic (congestive) heart failure: Secondary | ICD-10-CM

## 2012-10-23 DIAGNOSIS — I4891 Unspecified atrial fibrillation: Secondary | ICD-10-CM

## 2012-10-23 LAB — ICD DEVICE OBSERVATION
AL AMPLITUDE: 4.2 mv
AL IMPEDENCE ICD: 387.5 Ohm
ATRIAL PACING ICD: 73 pct
BAMS-0001: 170 {beats}/min
BAMS-0003: 70 {beats}/min
CHARGE TIME: 9.4 s
DEVICE MODEL ICD: 794739
FVT: 0
HV IMPEDENCE: 78 Ohm
LV LEAD IMPEDENCE ICD: 475 Ohm
LV LEAD THRESHOLD: 0.5 V
MODE SWITCH EPISODES: 17
PACEART VT: 0
RV LEAD AMPLITUDE: 12 mv
RV LEAD IMPEDENCE ICD: 562.5 Ohm
RV LEAD THRESHOLD: 0.625 V
TOT-0006: 20120710000000
TOT-0007: 1
TOT-0008: 0
TOT-0009: 1
TOT-0010: 11
TZON-0003SLOWVT: 340 ms
TZON-0004SLOWVT: 20
TZON-0005SLOWVT: 6
TZON-0010SLOWVT: 40 ms
VENTRICULAR PACING ICD: 95 pct
VF: 0

## 2012-10-23 NOTE — Progress Notes (Signed)
PCP: Louie Boston, MD Primary Cardiologist:  Dr Diona Browner  The patient presents today for routine electrophysiology followup.  Since last being seen in our clinic, the patient reports doing reasonably well.  She cares for her husband with alzheimer disease and finds this to be difficult.  He is up for most of the night and this impacts her sleep a bit.  She doesn't think that she is sleeping more than an hour or so at night.  This causes significant daytime fatigue and sleepiness.   Today, she denies symptoms of palpitations, chest pain, orthopnea, PND, presyncope, syncope, or neurologic sequela.   The patient feels that she is tolerating medications without difficulties and is otherwise without complaint today.   Past Medical History  Diagnosis Date  . Nonischemic cardiomyopathy     LVEF 35-40% by echo 8/13  . Chronic systolic heart failure   . LBBB (left bundle branch block)   . Hypothyroidism   . UTI (urinary tract infection)     Recurrent  . Glucose intolerance (pre-diabetes)   . Hyperlipemia   . Mitral regurgitation     Mild to Moderate  . Paroxysmal atrial fibrillation     Brief episodes by device interrogation   Past Surgical History  Procedure Laterality Date  . Cholecystectomy    . Total abdominal hysterectomy      (R) ovary removed  . Appendectomy    . Knee arthroscopy      Left  . Cardiac defibrillator placement  09/2009    St. Jude BiV ICD by Dr. Shanna Cisco class III    Current Outpatient Prescriptions  Medication Sig Dispense Refill  . aspirin 81 MG tablet Take 1 tablet by mouth daily.      . carvedilol (COREG) 12.5 MG tablet Take 1 tablet (12.5 mg total) by mouth 2 (two) times daily.  180 tablet  3  . cetirizine (ZYRTEC) 10 MG tablet Take 10 mg by mouth daily.       . diazepam (VALIUM) 2 MG tablet Take 2 mg by mouth every 12 (twelve) hours as needed.       . digoxin (LANOXIN) 0.125 MG tablet Take 1 tablet by mouth daily.       . furosemide (LASIX) 40 MG tablet  Take 80 mg by mouth every morning. & 1 every evening      . glipiZIDE (GLUCOTROL XL) 2.5 MG 24 hr tablet Take 2.5 mg by mouth daily.        Marland Kitchen levothyroxine (SYNTHROID, LEVOTHROID) 75 MCG tablet Take 1/2 tablet by mouth daily.        Marland Kitchen losartan (COZAAR) 25 MG tablet Take 1/2 tab (12.5mg ) daily  15 tablet  3  . polyethylene glycol (MIRALAX / GLYCOLAX) packet Use daily as directed.       . ranitidine (ZANTAC) 150 MG capsule Take 150 mg by mouth daily.       Marland Kitchen spironolactone (ALDACTONE) 25 MG tablet Take 1 tablet by mouth daily.        No current facility-administered medications for this visit.    Allergies  Allergen Reactions  . Bactrim   . Cephalexin   . Clindamycin   . Contrast Media [Iodinated Diagnostic Agents]   . Latex   . Levofloxacin   . Nitrofurantoin Monohyd Macro   . Penicillins     History   Social History  . Marital Status: Married    Spouse Name: N/A    Number of Children: N/A  . Years of Education: N/A  Occupational History  . Not on file.   Social History Main Topics  . Smoking status: Former Smoker -- 0.30 packs/day for 20 years    Types: Cigarettes    Quit date: 01/22/1996  . Smokeless tobacco: Never Used  . Alcohol Use: No  . Drug Use: No  . Sexual Activity: Not on file   Other Topics Concern  . Not on file   Social History Narrative  . No narrative on file    Family History  Problem Relation Age of Onset  . Diabetes Neg Hx   . Hypertension Neg Hx   . Coronary artery disease Neg Hx      Physical Exam: Filed Vitals:   10/23/12 1040  BP: 84/54  Pulse: 62  Height: 5\' 4"  (1.626 m)  Weight: 207 lb (93.895 kg)  SpO2: 97%    GEN- The patient is overweight appearing, alert and oriented x 3 today.   Head- normocephalic, atraumatic Eyes-  Sclera clear, conjunctiva pink Ears- hearing intact Oropharynx- clear Neck- supple, no JVP Lymph- no cervical lymphadenopathy Lungs- Clear to ausculation bilaterally, normal work of breathing Chest-  ICD pocket is well healed Heart- Regular rate and rhythm, no murmurs, rubs or gallops, PMI not laterally displaced GI- soft, NT, ND, + BS Extremities- no clubbing, cyanosis, trace edema  ICD interrogation- reviewed in detail today,  See PACEART report  Assessment and Plan:   1. Chronic systolic dysfunction stable 2 gram sodium diet encouraged  Normal BiV ICD function See Pace Art report 95% BIV paced.    [facility] checks every 3 months I will see in a year

## 2012-10-23 NOTE — Patient Instructions (Addendum)
   [facility] remote checks Continue all current medications. Your physician wants you to follow up in:  1 year.  You will receive a reminder letter in the mail one-two months in advance.  If you don't receive a letter, please call our office to schedule the follow up appointment - Dr. Johney Frame

## 2012-11-10 ENCOUNTER — Encounter: Payer: Self-pay | Admitting: Cardiology

## 2012-11-30 ENCOUNTER — Encounter: Payer: Self-pay | Admitting: Internal Medicine

## 2013-01-04 ENCOUNTER — Ambulatory Visit (INDEPENDENT_AMBULATORY_CARE_PROVIDER_SITE_OTHER): Payer: Medicare Other | Admitting: Cardiology

## 2013-01-04 ENCOUNTER — Encounter: Payer: Self-pay | Admitting: Cardiology

## 2013-01-04 VITALS — BP 87/55 | HR 57 | Ht 64.0 in | Wt 209.8 lb

## 2013-01-04 DIAGNOSIS — I4891 Unspecified atrial fibrillation: Secondary | ICD-10-CM

## 2013-01-04 DIAGNOSIS — I5022 Chronic systolic (congestive) heart failure: Secondary | ICD-10-CM

## 2013-01-04 DIAGNOSIS — I428 Other cardiomyopathies: Secondary | ICD-10-CM

## 2013-01-04 DIAGNOSIS — I080 Rheumatic disorders of both mitral and aortic valves: Secondary | ICD-10-CM

## 2013-01-04 NOTE — Progress Notes (Signed)
Clinical Summary Melissa Paul is a 67 y.o.female last seen in October. No change in chronic fatigue and shortness of breath. Her weight is relatively stable over the last year. She reports no device discharges, occasional sense of palpitations.  We had discussed indications for stroke prophylaxis anticoagulation secondary to paroxysmal atrial fibrillation and she has declined so far. She prefers to stay on aspirin for now.  Last echocardiogram in August 2013 revealed LVEF 35-40% with normal filling pressures, diastolic dysfunction.  She had lab work with Dr. [name] with a physical in the last few months, results to be requested.   Allergies  Allergen Reactions  . Bactrim   . Cephalexin   . Clindamycin   . Contrast Media [Iodinated Diagnostic Agents]   . Latex   . Levofloxacin   . Nitrofurantoin Monohyd Macro   . Penicillins     Current Outpatient Prescriptions  Medication Sig Dispense Refill  . aspirin 81 MG tablet Take 1 tablet by mouth daily.      . carvedilol (COREG) 12.5 MG tablet Take 1 tablet (12.5 mg total) by mouth 2 (two) times daily.  180 tablet  3  . cetirizine (ZYRTEC) 10 MG tablet Take 10 mg by mouth daily.       . diazepam (VALIUM) 2 MG tablet Take 2 mg by mouth every 12 (twelve) hours as needed.       . digoxin (LANOXIN) 0.125 MG tablet Take 1 tablet by mouth daily.       . furosemide (LASIX) 40 MG tablet Take 2 tabs (80mg ) every morning & 1 tab (40mg ) every evening if needed      . glipiZIDE (GLUCOTROL XL) 2.5 MG 24 hr tablet Take 2.5 mg by mouth daily.        Marland Kitchen levothyroxine (SYNTHROID, LEVOTHROID) 75 MCG tablet Take 1/2 tablet by mouth daily.        Marland Kitchen losartan (COZAAR) 25 MG tablet Take 1/2 tab (12.5mg ) daily  15 tablet  3  . polyethylene glycol (MIRALAX / GLYCOLAX) packet Use daily as directed.       . ranitidine (ZANTAC) 150 MG capsule Take 150 mg by mouth daily.       Marland Kitchen spironolactone (ALDACTONE) 25 MG tablet Take 1 tablet by mouth daily.        No  current facility-administered medications for this visit.    Past Medical History  Diagnosis Date  . Nonischemic cardiomyopathy     LVEF 35-40% by echo 8/13  . Chronic systolic heart failure   . LBBB (left bundle branch block)   . Hypothyroidism   . UTI (urinary tract infection)     Recurrent  . Glucose intolerance (pre-diabetes)   . Hyperlipemia   . Mitral regurgitation     Mild to Moderate  . Paroxysmal atrial fibrillation     Brief episodes by device interrogation    Past Surgical History  Procedure Laterality Date  . Cholecystectomy    . Total abdominal hysterectomy      (R) ovary removed  . Appendectomy    . Knee arthroscopy      Left  . Cardiac defibrillator placement  09/2009    St. Jude BiV ICD by Dr. Shanna Paul class III    Social History Melissa Paul reports that she quit smoking about 16 years ago. Her smoking use included Cigarettes. She has a 6 pack-year smoking history. She has never used smokeless tobacco. Melissa Paul reports that she does not drink alcohol.  Review of  Systems As outlined above. Has had some trouble with nausea. Will be seeing gastroenterology in January  Physical Examination Filed Vitals:   01/04/13 1522  BP: 87/55  Pulse: 57   Filed Weights   01/04/13 1522  Weight: 209 lb 12.8 oz (95.165 kg)    Patient in no acute distress.  HEENT: Conjunctiva and lids normal, oropharynx clear.  Neck: Supple, no elevated JVP, no carotid bruits, no thyromegaly.  Lungs: Clear to auscultation, nonlabored breathing at rest.  Cardiac: Regular rate and rhythm, no S3 or significant systolic murmur, no pericardial rub.  Abdomen: Soft, nontender, bowel sounds present, no guarding or rebound.  Extremities: 1+ edema and stasis.    Problem List and Plan   Chronic systolic heart failure Symptomatically stable, weight is also stable. Request most recent lab work from Dr. [name]. Continue current regimen.  MITRAL REGURGITATION Mild to moderate by last  assessment.  Atrial fibrillation Paroxysmal. She declines anticoagulation, will stay on aspirin for now.  CARDIOMYOPATHY, DILATED LVEF 35-40%, status post St. Jude biventricular ICD, followed by Dr. [name].    Melissa Paul, M.D., F.A.C.C.

## 2013-01-04 NOTE — Assessment & Plan Note (Signed)
Symptomatically stable, weight is also stable. Request most recent lab work from Dr. Margo Common. Continue current regimen.

## 2013-01-04 NOTE — Patient Instructions (Signed)
Your physician recommends that you schedule a follow-up appointment in: 4 months. You will receive a reminder letter in the mail in about 1-2 months reminding you to call and schedule your appointment. If you don't receive this letter, please contact our office. Your physician recommends that you continue on your current medications as directed. Please refer to the Current Medication list given to you today. 

## 2013-01-04 NOTE — Assessment & Plan Note (Signed)
Mild to moderate by last assessment.

## 2013-01-04 NOTE — Assessment & Plan Note (Signed)
LVEF 35-40%, status post St. Jude biventricular ICD, followed by Dr. Johney Frame.

## 2013-01-04 NOTE — Assessment & Plan Note (Signed)
Paroxysmal. She declines anticoagulation, will stay on aspirin for now. 

## 2013-01-27 ENCOUNTER — Encounter: Payer: Self-pay | Admitting: Internal Medicine

## 2013-01-27 ENCOUNTER — Ambulatory Visit (INDEPENDENT_AMBULATORY_CARE_PROVIDER_SITE_OTHER): Payer: Medicare Other | Admitting: *Deleted

## 2013-01-27 DIAGNOSIS — I5022 Chronic systolic (congestive) heart failure: Secondary | ICD-10-CM

## 2013-01-27 DIAGNOSIS — I4891 Unspecified atrial fibrillation: Secondary | ICD-10-CM

## 2013-01-27 DIAGNOSIS — I428 Other cardiomyopathies: Secondary | ICD-10-CM

## 2013-02-08 LAB — MDC_IDC_ENUM_SESS_TYPE_REMOTE
Battery Remaining Longevity: 41 mo
Battery Remaining Percentage: 49 %
Battery Voltage: 2.92 V
Brady Statistic AP VP Percent: 79 %
Brady Statistic AP VS Percent: 1 %
Brady Statistic AS VP Percent: 17 %
Brady Statistic AS VS Percent: 1 %
Brady Statistic RA Percent Paced: 78 %
Date Time Interrogation Session: 20150107131828
HighPow Impedance: 75 Ohm
HighPow Impedance: 79 Ohm
Implantable Pulse Generator Serial Number: 794739
Lead Channel Impedance Value: 380 Ohm
Lead Channel Impedance Value: 460 Ohm
Lead Channel Impedance Value: 530 Ohm
Lead Channel Pacing Threshold Amplitude: 0.5 V
Lead Channel Pacing Threshold Amplitude: 0.75 V
Lead Channel Pacing Threshold Pulse Width: 0.5 ms
Lead Channel Pacing Threshold Pulse Width: 0.5 ms
Lead Channel Setting Pacing Amplitude: 2 V
Lead Channel Setting Pacing Amplitude: 2 V
Lead Channel Setting Pacing Amplitude: 2.5 V
Lead Channel Setting Pacing Pulse Width: 0.5 ms
Lead Channel Setting Pacing Pulse Width: 0.5 ms
Lead Channel Setting Sensing Sensitivity: 0.5 mV
Zone Setting Detection Interval: 270 ms
Zone Setting Detection Interval: 340 ms

## 2013-02-16 ENCOUNTER — Encounter: Payer: Self-pay | Admitting: *Deleted

## 2013-02-22 ENCOUNTER — Other Ambulatory Visit: Payer: Self-pay | Admitting: *Deleted

## 2013-02-22 MED ORDER — LOSARTAN POTASSIUM 25 MG PO TABS: ORAL_TABLET | ORAL | Status: DC

## 2013-03-23 ENCOUNTER — Other Ambulatory Visit: Payer: Self-pay | Admitting: *Deleted

## 2013-03-23 MED ORDER — FUROSEMIDE 40 MG PO TABS: ORAL_TABLET | ORAL | Status: DC

## 2013-03-29 ENCOUNTER — Ambulatory Visit (INDEPENDENT_AMBULATORY_CARE_PROVIDER_SITE_OTHER): Payer: Medicare Other | Admitting: *Deleted

## 2013-03-29 DIAGNOSIS — I5022 Chronic systolic (congestive) heart failure: Secondary | ICD-10-CM

## 2013-03-29 DIAGNOSIS — Z95810 Presence of automatic (implantable) cardiac defibrillator: Secondary | ICD-10-CM

## 2013-03-29 NOTE — Progress Notes (Signed)
EPIC Encounter for ICM Monitoring  Patient Name: Melissa Paul is a 68 y.o. female Date: 03/29/2013 Primary Care Physican: Deloria Lair, MD Primary Cardiologist: Domenic Polite Electrophysiologist: Allred Dry Weight: 202 lbs       In the past month, have you:  1. Gained more than 2 pounds in a day or more than 5 pounds in a week? Yes. She has noticed that her weights have been up to 211 lbs in the last 2 weeks. She feels like her weights have been flucuating closer to 208- 209 lbs. Today she was 207 lbs.   2. Had changes in your medications (with verification of current medications)? No. Lasix is 40 mg two tablets every morning with an extra 40 mg in the evening if needed. Saturday was the last time she took an extra evening dose of lasix. She also uses spironolactone 25 mg daily.  3. Had more shortness of breath than is usual for you? Yes. She is SOB at rest and states this has been going on for "a while." She also reports some sinus drainage and feeling like her chest is full.  4. Limited your activity because of shortness of breath? no  5. Not been able to sleep because of shortness of breath? No. She does have to prop up on 2-3 pillows every night, but this is not new.  6. Had increased swelling in your feet or ankles? Yes. Bilateral ankle edema. She does experience pain with this sometimes. There is occasional redness to the area.   7. Had symptoms of dehydration (dizziness, dry mouth, increased thirst, decreased urine output) no  8. Had changes in sodium restriction? no  9. Been compliant with medication? Yes   ICM trend:   Follow-up plan: ICM clinic phone appointment: 04/30/13 ( full transmission). I reviewed the patient's symptoms with Dr. [name]. He would like the patient to take her evening dose of lasix daily until she can be seen by Dr. Domenic Polite in the [employer] office later this week. I have discussed this with the patient and she is agreeable. I advised we will have to contact  the [facility] office tomorrow and call her back with an appointment for later this week.   Copy of note sent to patient's primary care physician, primary cardiologist, and device following physician.  [name], RN, BSN 03/29/2013 4:57 PM

## 2013-03-29 NOTE — Progress Notes (Signed)
Above discussed at length with Ms Mikle Bosworth.  Given symptoms and weight gain (even in the absence of elevated coreview), I think that we should increase lasix at least transiently.  We will try to get her back in to see Dr Domenic Polite at the next available time in [place].

## 2013-03-30 ENCOUNTER — Telehealth: Payer: Self-pay | Admitting: *Deleted

## 2013-03-30 NOTE — Telephone Encounter (Signed)
Message copied by Merlene Laughter on Tue Mar 30, 2013  1:22 PM ------      Message from: Satira Sark      Created: Tue Mar 30, 2013  7:32 AM       Please see the recent device check note with review by Dr. [name]. I reviewed the recommendations. No change in thoracic impedance. Current listed weight 207 pounds, actually less than her last visit. Not clear to me that we need to do anything here other than perhaps have her take an extra dose of Lasix for a day or 2. The question is determining what her true dry weight is. Please call her and discuss symptoms, determine at which weight she seems to feel the best. We can probably handle this as an outpatient unless she is clearly worsening.             ----- Message -----         From: Emily Filbert, RN         Sent: 03/29/2013   5:54 PM           To: Satira Sark, MD                   ------

## 2013-03-30 NOTE — Telephone Encounter (Signed)
Patient informed and verbalized understanding of plan. Patient did inform nurse that she started taking the extra 40 mg 2 days ago.

## 2013-03-30 NOTE — Progress Notes (Signed)
I reviewed this note and information. I am having our nurse in the [place] office call the patient and we will graft a plan going forward.

## 2013-03-30 NOTE — Telephone Encounter (Signed)
We should probably aim to get her weight down about 5 pounds then. Take Lasix 80 mg in the morning and 40 mg in the evening over the next week, make sure that she weighs daily. If she feels better with this weight loss accomplished, go back to her prior Lasix regimen. Otherwise we can see her back and decide if any other adjustments are needed.

## 2013-03-30 NOTE — Telephone Encounter (Signed)
Per patient her dry weight is 202 lbs. Patient C/O tightness in chest area, sob a little worse, and more edema in ankles in feet. No worsening dizziness. Patient said her most comfortable weight is 202 lbs. Patient states she is tired and fatigue because she doesn't get any rest at night due to taking care of her husband whom has dementia. Furosemide dose and directions confirmed with patient.

## 2013-04-30 ENCOUNTER — Encounter: Payer: Self-pay | Admitting: Internal Medicine

## 2013-04-30 ENCOUNTER — Ambulatory Visit (INDEPENDENT_AMBULATORY_CARE_PROVIDER_SITE_OTHER): Payer: Medicare Other | Admitting: *Deleted

## 2013-04-30 DIAGNOSIS — I428 Other cardiomyopathies: Secondary | ICD-10-CM

## 2013-04-30 DIAGNOSIS — Z95810 Presence of automatic (implantable) cardiac defibrillator: Secondary | ICD-10-CM

## 2013-04-30 DIAGNOSIS — I5022 Chronic systolic (congestive) heart failure: Secondary | ICD-10-CM

## 2013-05-05 LAB — MDC_IDC_ENUM_SESS_TYPE_REMOTE
Battery Remaining Longevity: 38 mo
Battery Remaining Percentage: 45 %
Battery Voltage: 2.9 V
Brady Statistic AP VP Percent: 82 %
Brady Statistic AP VS Percent: 1 %
Brady Statistic AS VP Percent: 15 %
Brady Statistic AS VS Percent: 1 %
Brady Statistic RA Percent Paced: 81 %
Date Time Interrogation Session: 20150410064551
HighPow Impedance: 77 Ohm
HighPow Impedance: 77 Ohm
Implantable Pulse Generator Serial Number: 794739
Lead Channel Impedance Value: 390 Ohm
Lead Channel Impedance Value: 490 Ohm
Lead Channel Impedance Value: 560 Ohm
Lead Channel Pacing Threshold Amplitude: 0.5 V
Lead Channel Pacing Threshold Amplitude: 0.625 V
Lead Channel Pacing Threshold Amplitude: 0.75 V
Lead Channel Pacing Threshold Pulse Width: 0.5 ms
Lead Channel Pacing Threshold Pulse Width: 0.5 ms
Lead Channel Pacing Threshold Pulse Width: 0.5 ms
Lead Channel Sensing Intrinsic Amplitude: 12 mV
Lead Channel Sensing Intrinsic Amplitude: 3.3 mV
Lead Channel Setting Pacing Amplitude: 2 V
Lead Channel Setting Pacing Amplitude: 2 V
Lead Channel Setting Pacing Amplitude: 2.5 V
Lead Channel Setting Pacing Pulse Width: 0.5 ms
Lead Channel Setting Pacing Pulse Width: 0.5 ms
Lead Channel Setting Sensing Sensitivity: 0.5 mV
Zone Setting Detection Interval: 270 ms
Zone Setting Detection Interval: 340 ms

## 2013-05-13 ENCOUNTER — Ambulatory Visit (INDEPENDENT_AMBULATORY_CARE_PROVIDER_SITE_OTHER): Payer: Medicare Other | Admitting: Cardiology

## 2013-05-13 ENCOUNTER — Encounter: Payer: Self-pay | Admitting: Cardiology

## 2013-05-13 VITALS — BP 98/62 | HR 59 | Ht 64.0 in | Wt 211.0 lb

## 2013-05-13 DIAGNOSIS — I4891 Unspecified atrial fibrillation: Secondary | ICD-10-CM

## 2013-05-13 DIAGNOSIS — I080 Rheumatic disorders of both mitral and aortic valves: Secondary | ICD-10-CM

## 2013-05-13 DIAGNOSIS — I5022 Chronic systolic (congestive) heart failure: Secondary | ICD-10-CM

## 2013-05-13 NOTE — Assessment & Plan Note (Signed)
Mild to moderate by last assessment. Stable examination.

## 2013-05-13 NOTE — Assessment & Plan Note (Signed)
Paroxysmal. She declines anticoagulation, will stay on aspirin for now.

## 2013-05-13 NOTE — Progress Notes (Signed)
Clinical Summary Ms. Bautch is a 68 y.o.female last seen in December 2014. Interval CRT-D interrogation noted. Ten mode switches were described, no VT episodes, Bi-V pacing 97% of the time. She reports chronic fatigue, limited ability to ambulate related to bilateral knee arthritis and chronic pain. No major change in her breathing status, no palpitations or chest pain.  We have discussed indications for stroke prophylaxis anticoagulation secondary to paroxysmal atrial fibrillation and she has declined so far. She prefers to stay on aspirin for now.  Last echocardiogram in August 2013 revealed LVEF 35-40% with normal filling pressures, diastolic dysfunction. Lab work from this past October showed BUN 10, creatinine 0.8, sodium 136, potassium 4.1, hemoglobin 14.3.   Allergies  Allergen Reactions  . Bactrim   . Cephalexin   . Clindamycin   . Contrast Media [Iodinated Diagnostic Agents]   . Latex   . Levofloxacin   . Nitrofurantoin Monohyd Macro   . Penicillins     Current Outpatient Prescriptions  Medication Sig Dispense Refill  . aspirin 81 MG tablet Take 1 tablet by mouth daily.      . carvedilol (COREG) 12.5 MG tablet Take 1 tablet (12.5 mg total) by mouth 2 (two) times daily.  180 tablet  3  . cetirizine (ZYRTEC) 10 MG tablet Take 10 mg by mouth daily.       . diazepam (VALIUM) 2 MG tablet Take 2 mg by mouth every 12 (twelve) hours as needed.       . digoxin (LANOXIN) 0.125 MG tablet Take 1 tablet by mouth daily.       . furosemide (LASIX) 40 MG tablet Take 2 tabs (80mg ) every morning & 1 tab (40mg ) every evening if needed  45 tablet  3  . glipiZIDE (GLUCOTROL XL) 2.5 MG 24 hr tablet Take 2.5 mg by mouth daily.        Marland Kitchen levothyroxine (SYNTHROID, LEVOTHROID) 75 MCG tablet Take 1/2 tablet by mouth daily.        Marland Kitchen losartan (COZAAR) 25 MG tablet Take 1/2 tab (12.5mg ) daily  15 tablet  6  . polyethylene glycol (MIRALAX / GLYCOLAX) packet Use daily as directed.       . ranitidine  (ZANTAC) 150 MG capsule Take 150 mg by mouth daily.       Marland Kitchen spironolactone (ALDACTONE) 25 MG tablet Take 1 tablet by mouth daily.        No current facility-administered medications for this visit.    Past Medical History  Diagnosis Date  . Nonischemic cardiomyopathy     LVEF 35-40% by echo 8/13  . Chronic systolic heart failure   . LBBB (left bundle branch block)   . Hypothyroidism   . UTI (urinary tract infection)     Recurrent  . Glucose intolerance (pre-diabetes)   . Hyperlipemia   . Mitral regurgitation     Mild to Moderate  . Paroxysmal atrial fibrillation     Brief episodes by device interrogation    Past Surgical History  Procedure Laterality Date  . Cholecystectomy    . Total abdominal hysterectomy      (R) ovary removed  . Appendectomy    . Knee arthroscopy      Left  . Cardiac defibrillator placement  09/2009    St. Jude BiV ICD by Dr. Sandria Manly class III    Social History Ms. Leppla reports that she quit smoking about 17 years ago. Her smoking use included Cigarettes. She has a 6 pack-year smoking history.  She has never used smokeless tobacco. Ms. Hoar reports that she does not drink alcohol.  Review of Systems As outlined above, otherwise negative.  Physical Examination Filed Vitals:   05/13/13 1017  BP: 98/62  Pulse: 59   Filed Weights   05/13/13 1017  Weight: 211 lb (95.709 kg)    Obese woman, appears comfortable at rest.  HEENT: Conjunctiva and lids normal, oropharynx clear.  Neck: Supple, no elevated JVP, no carotid bruits, no thyromegaly.  Lungs: Clear to auscultation with decreased breath sounds, nonlabored breathing at rest.  Cardiac: Regular rate and rhythm, no S3 or significant systolic murmur, no pericardial rub.  Abdomen: Soft, nontender, bowel sounds present, no guarding or rebound.  Extremities: 1+ edema and stasis. Knee swelling.   Problem List and Plan   Chronic systolic heart failure LVEF 35-40%, status post St. Jude  biventricular ICD. Clinically stable on current regimen with low normal blood pressure. She reports no progressive shortness of breath, no palpitations or device discharges.  Atrial fibrillation Paroxysmal. She declines anticoagulation, will stay on aspirin for now.  MITRAL REGURGITATION Mild to moderate by last assessment. Stable examination.    Satira Sark, M.D., F.A.C.C.

## 2013-05-13 NOTE — Assessment & Plan Note (Signed)
LVEF 35-40%, status post St. Jude biventricular ICD. Clinically stable on current regimen with low normal blood pressure. She reports no progressive shortness of breath, no palpitations or device discharges.

## 2013-05-13 NOTE — Patient Instructions (Signed)
Your physician recommends that you schedule a follow-up appointment in: 4 months.  Your physician recommends that you continue on your current medications as directed. Please refer to the Current Medication list given to you today.  

## 2013-05-14 ENCOUNTER — Encounter: Payer: Self-pay | Admitting: *Deleted

## 2013-08-05 ENCOUNTER — Telehealth: Payer: Self-pay | Admitting: Cardiology

## 2013-08-05 ENCOUNTER — Encounter: Payer: Medicare Other | Admitting: *Deleted

## 2013-08-05 NOTE — Telephone Encounter (Signed)
Attempted to call pt x1 to confirm remote transmission for today. No answer

## 2013-08-06 ENCOUNTER — Ambulatory Visit (INDEPENDENT_AMBULATORY_CARE_PROVIDER_SITE_OTHER): Payer: Medicare Other | Admitting: *Deleted

## 2013-08-06 ENCOUNTER — Encounter: Payer: Self-pay | Admitting: Cardiology

## 2013-08-06 DIAGNOSIS — I5022 Chronic systolic (congestive) heart failure: Secondary | ICD-10-CM

## 2013-08-06 DIAGNOSIS — I428 Other cardiomyopathies: Secondary | ICD-10-CM

## 2013-08-07 ENCOUNTER — Encounter: Payer: Self-pay | Admitting: Internal Medicine

## 2013-08-07 LAB — MDC_IDC_ENUM_SESS_TYPE_REMOTE
Battery Remaining Longevity: 34 mo
Battery Remaining Percentage: 41 %
Battery Voltage: 2.9 V
Brady Statistic AP VP Percent: 82 %
Brady Statistic AP VS Percent: 1 %
Brady Statistic AS VP Percent: 14 %
Brady Statistic AS VS Percent: 1 %
Brady Statistic RA Percent Paced: 81 %
Date Time Interrogation Session: 20150718192012
HighPow Impedance: 69 Ohm
HighPow Impedance: 69 Ohm
Implantable Pulse Generator Serial Number: 794739
Lead Channel Impedance Value: 390 Ohm
Lead Channel Impedance Value: 460 Ohm
Lead Channel Impedance Value: 530 Ohm
Lead Channel Pacing Threshold Amplitude: 0.625 V
Lead Channel Pacing Threshold Amplitude: 0.625 V
Lead Channel Pacing Threshold Amplitude: 0.75 V
Lead Channel Pacing Threshold Pulse Width: 0.5 ms
Lead Channel Pacing Threshold Pulse Width: 0.5 ms
Lead Channel Pacing Threshold Pulse Width: 0.5 ms
Lead Channel Sensing Intrinsic Amplitude: 11.8 mV
Lead Channel Sensing Intrinsic Amplitude: 3.7 mV
Lead Channel Setting Pacing Amplitude: 2 V
Lead Channel Setting Pacing Amplitude: 2 V
Lead Channel Setting Pacing Amplitude: 2.5 V
Lead Channel Setting Pacing Pulse Width: 0.5 ms
Lead Channel Setting Pacing Pulse Width: 0.5 ms
Lead Channel Setting Sensing Sensitivity: 0.5 mV
Zone Setting Detection Interval: 270 ms
Zone Setting Detection Interval: 340 ms

## 2013-08-09 NOTE — Progress Notes (Signed)
Remote ICD transmission.   

## 2013-08-16 ENCOUNTER — Telehealth: Payer: Self-pay | Admitting: Cardiology

## 2013-08-16 NOTE — Telephone Encounter (Signed)
Informed pt that we did receive transmission. Pt verbalized understanding.  

## 2013-08-16 NOTE — Telephone Encounter (Signed)
New message ° ° ° ° ° ° ° ° °Pt would like to know if you received her transmission °

## 2013-08-24 ENCOUNTER — Other Ambulatory Visit: Payer: Self-pay | Admitting: *Deleted

## 2013-08-24 MED ORDER — FUROSEMIDE 40 MG PO TABS: ORAL_TABLET | ORAL | Status: DC

## 2013-09-02 ENCOUNTER — Encounter: Payer: Self-pay | Admitting: Cardiology

## 2013-09-16 ENCOUNTER — Ambulatory Visit (INDEPENDENT_AMBULATORY_CARE_PROVIDER_SITE_OTHER): Payer: Medicare Other | Admitting: Cardiology

## 2013-09-16 ENCOUNTER — Encounter: Payer: Self-pay | Admitting: Cardiology

## 2013-09-16 VITALS — BP 104/61 | HR 61 | Ht 64.0 in | Wt 196.0 lb

## 2013-09-16 DIAGNOSIS — I5022 Chronic systolic (congestive) heart failure: Secondary | ICD-10-CM

## 2013-09-16 DIAGNOSIS — Z95810 Presence of automatic (implantable) cardiac defibrillator: Secondary | ICD-10-CM

## 2013-09-16 DIAGNOSIS — I4891 Unspecified atrial fibrillation: Secondary | ICD-10-CM

## 2013-09-16 NOTE — Assessment & Plan Note (Signed)
Paroxysmal. She denies palpitations, mode switches are noted on device interrogation. She has declined anticoagulation consistently, prefers to stay on aspirin.

## 2013-09-16 NOTE — Assessment & Plan Note (Signed)
St. Jude biventricular ICD, followed by Dr. [name].

## 2013-09-16 NOTE — Patient Instructions (Signed)

## 2013-09-16 NOTE — Assessment & Plan Note (Signed)
Symptomatically stable in terms of volume status, weight is down overall. She continues on medical therapy with LVEF 35-40%. No changes made today.

## 2013-09-16 NOTE — Progress Notes (Signed)
Clinical Summary Ms. Schrom is a 68 y.o.female last seen in April. Recent remote CRT-D check noted, reviewed by Dr. [name]. There were 12 mode switches, short duration, biventricular pacing 97% of the time.  She is under a lot of stress related to the care of her husband at home with dementia. She reports stable NYHA class II dyspnea. She is limited by knee pain. She denies any chest pain or palpitations. Her weight is down almost 15 pounds since earlier in the year.  We have discussed indications for stroke prophylaxis anticoagulation secondary to paroxysmal atrial fibrillation and she has declined so far. She prefers to stay on aspirin for now.  Last echocardiogram in August 2013 revealed LVEF 35-40% with normal filling pressures, diastolic dysfunction.   Allergies  Allergen Reactions  . Bactrim   . Cephalexin   . Clindamycin   . Contrast Media [Iodinated Diagnostic Agents]   . Latex   . Levofloxacin   . Nitrofurantoin Monohyd Macro   . Penicillins     Current Outpatient Prescriptions  Medication Sig Dispense Refill  . aspirin 81 MG tablet Take 1 tablet by mouth daily.      . carvedilol (COREG) 12.5 MG tablet Take 1 tablet (12.5 mg total) by mouth 2 (two) times daily.  180 tablet  3  . cetirizine (ZYRTEC) 10 MG tablet Take 10 mg by mouth daily.       . digoxin (LANOXIN) 0.125 MG tablet Take 1 tablet by mouth daily.       . furosemide (LASIX) 40 MG tablet Take 2 tabs (80mg ) every morning & 1 tab (40mg ) every evening if needed  60 tablet  3  . glipiZIDE (GLUCOTROL XL) 2.5 MG 24 hr tablet Take 2.5 mg by mouth daily.        Marland Kitchen levothyroxine (SYNTHROID, LEVOTHROID) 75 MCG tablet Take 1/2 tablet by mouth daily.        Marland Kitchen losartan (COZAAR) 25 MG tablet Take 1/2 tab (12.5mg ) daily  15 tablet  6  . polyethylene glycol (MIRALAX / GLYCOLAX) packet Take 17 g by mouth as needed. Use daily as directed.      . ranitidine (ZANTAC) 150 MG capsule Take 150 mg by mouth daily.       Marland Kitchen  spironolactone (ALDACTONE) 25 MG tablet Take 1 tablet by mouth daily.       . diazepam (VALIUM) 2 MG tablet Take 2 mg by mouth 3 (three) times daily.        No current facility-administered medications for this visit.    Past Medical History  Diagnosis Date  . Nonischemic cardiomyopathy     LVEF 35-40% by echo 8/13  . Chronic systolic heart failure   . LBBB (left bundle branch block)   . Hypothyroidism   . UTI (urinary tract infection)     Recurrent  . Glucose intolerance (pre-diabetes)   . Hyperlipemia   . Mitral regurgitation     Mild to Moderate  . Paroxysmal atrial fibrillation     Brief episodes by device interrogation    Past Surgical History  Procedure Laterality Date  . Cholecystectomy    . Total abdominal hysterectomy      (R) ovary removed  . Appendectomy    . Knee arthroscopy      Left  . Cardiac defibrillator placement  09/2009    St. Jude BiV ICD by Dr. Sandria Manly class III    Social History Ms. Buchinger reports that she quit smoking about 17 years  ago. Her smoking use included Cigarettes. She has a 6 pack-year smoking history. She has never used smokeless tobacco. Ms. Unterreiner reports that she does not drink alcohol.  Review of Systems Other systems reviewed and negative except as outlined.  Physical Examination Filed Vitals:   09/16/13 1000  BP: 104/61  Pulse: 61   Filed Weights   09/16/13 1000  Weight: 196 lb (88.905 kg)    Overweight woman, appears comfortable at rest.  HEENT: Conjunctiva and lids normal, oropharynx clear.  Neck: Supple, no elevated JVP, no carotid bruits, no thyromegaly.  Lungs: Clear to auscultation with decreased breath sounds, nonlabored breathing at rest.  Cardiac: Regular rate and rhythm, no S3 or significant systolic murmur, no pericardial rub.  Abdomen: Soft, nontender, bowel sounds present, no guarding or rebound.  Extremities: 1+ edema and stasis. Knee swelling.   Problem List and Plan   Chronic systolic heart  failure Symptomatically stable in terms of volume status, weight is down overall. She continues on medical therapy with LVEF 35-40%. No changes made today.  Atrial fibrillation Paroxysmal. She denies palpitations, mode switches are noted on device interrogation. She has declined anticoagulation consistently, prefers to stay on aspirin.  Automatic implantable cardioverter-defibrillator in situ St. Jude biventricular ICD, followed by Dr. [name].    Satira Sark, M.D., F.A.C.C.

## 2013-09-22 ENCOUNTER — Telehealth: Payer: Self-pay | Admitting: Internal Medicine

## 2013-09-22 NOTE — Telephone Encounter (Signed)
New message     Pt had a pacemaker check thurs in [place].  Since that visit, pt says her heart is speeding up and slowing down.  She says she is uncomfortable.  Please call

## 2013-09-22 NOTE — Telephone Encounter (Signed)
Asked pt to send manual remote. Patient voiced understanding.

## 2013-09-24 ENCOUNTER — Other Ambulatory Visit: Payer: Self-pay | Admitting: *Deleted

## 2013-09-24 MED ORDER — CARVEDILOL 12.5 MG PO TABS: 12.5000 mg | ORAL_TABLET | Freq: Two times a day (BID) | ORAL | Status: DC

## 2013-09-24 MED ORDER — LOSARTAN POTASSIUM 25 MG PO TABS: ORAL_TABLET | ORAL | Status: DC

## 2013-09-24 NOTE — Telephone Encounter (Signed)
Called pt bc remote was never received. Patient states that she was unable to send due to a broken cord. I offered her an appt in [place].  She could not make an appt in [place] bc she cares for her husband who ahs Alzheimer's. She was given an appt in [place] first available. She knows to go to ER if symptoms worsen before this appt.

## 2013-10-07 ENCOUNTER — Encounter: Payer: Self-pay | Admitting: Internal Medicine

## 2013-10-20 ENCOUNTER — Other Ambulatory Visit: Payer: Self-pay | Admitting: *Deleted

## 2013-10-20 ENCOUNTER — Ambulatory Visit (HOSPITAL_COMMUNITY): Payer: Medicare Other | Attending: Cardiovascular Disease

## 2013-10-20 ENCOUNTER — Encounter: Payer: Medicare Other | Admitting: *Deleted

## 2013-10-20 DIAGNOSIS — I4891 Unspecified atrial fibrillation: Secondary | ICD-10-CM

## 2013-10-20 DIAGNOSIS — N390 Urinary tract infection, site not specified: Secondary | ICD-10-CM | POA: Insufficient documentation

## 2013-10-20 DIAGNOSIS — R0602 Shortness of breath: Secondary | ICD-10-CM

## 2013-10-20 DIAGNOSIS — E785 Hyperlipidemia, unspecified: Secondary | ICD-10-CM | POA: Insufficient documentation

## 2013-10-20 DIAGNOSIS — I059 Rheumatic mitral valve disease, unspecified: Secondary | ICD-10-CM | POA: Insufficient documentation

## 2013-10-20 DIAGNOSIS — E039 Hypothyroidism, unspecified: Secondary | ICD-10-CM | POA: Insufficient documentation

## 2013-10-20 DIAGNOSIS — R5383 Other fatigue: Secondary | ICD-10-CM

## 2013-10-20 DIAGNOSIS — I5022 Chronic systolic (congestive) heart failure: Secondary | ICD-10-CM

## 2013-10-20 LAB — MDC_IDC_ENUM_SESS_TYPE_INCLINIC: Implantable Pulse Generator Serial Number: 794739

## 2013-10-20 NOTE — Progress Notes (Signed)
2D Echo completed. 10/20/2013

## 2013-10-27 ENCOUNTER — Encounter: Payer: Self-pay | Admitting: Internal Medicine

## 2013-11-12 ENCOUNTER — Encounter: Payer: Self-pay | Admitting: Internal Medicine

## 2013-11-12 ENCOUNTER — Ambulatory Visit (INDEPENDENT_AMBULATORY_CARE_PROVIDER_SITE_OTHER): Payer: Medicare Other | Admitting: Internal Medicine

## 2013-11-12 VITALS — BP 90/59 | HR 85 | Ht 64.0 in | Wt 191.0 lb

## 2013-11-12 DIAGNOSIS — I2589 Other forms of chronic ischemic heart disease: Secondary | ICD-10-CM

## 2013-11-12 DIAGNOSIS — I5022 Chronic systolic (congestive) heart failure: Secondary | ICD-10-CM

## 2013-11-12 DIAGNOSIS — I480 Paroxysmal atrial fibrillation: Secondary | ICD-10-CM

## 2013-11-12 DIAGNOSIS — I255 Ischemic cardiomyopathy: Secondary | ICD-10-CM

## 2013-11-12 LAB — MDC_IDC_ENUM_SESS_TYPE_INCLINIC
Battery Remaining Longevity: 30 mo
Brady Statistic RA Percent Paced: 86 %
Brady Statistic RV Percent Paced: 95 %
Date Time Interrogation Session: 20151023144710
HighPow Impedance: 76.5 Ohm
Implantable Pulse Generator Serial Number: 794739
Lead Channel Impedance Value: 437.5 Ohm
Lead Channel Impedance Value: 525 Ohm
Lead Channel Impedance Value: 600 Ohm
Lead Channel Pacing Threshold Amplitude: 0.625 V
Lead Channel Pacing Threshold Amplitude: 0.625 V
Lead Channel Pacing Threshold Amplitude: 0.75 V
Lead Channel Pacing Threshold Pulse Width: 0.5 ms
Lead Channel Pacing Threshold Pulse Width: 0.5 ms
Lead Channel Pacing Threshold Pulse Width: 0.5 ms
Lead Channel Sensing Intrinsic Amplitude: 11.8 mV
Lead Channel Sensing Intrinsic Amplitude: 3.9 mV
Lead Channel Setting Pacing Amplitude: 2 V
Lead Channel Setting Pacing Amplitude: 2 V
Lead Channel Setting Pacing Amplitude: 2.5 V
Lead Channel Setting Pacing Pulse Width: 0.5 ms
Lead Channel Setting Pacing Pulse Width: 0.5 ms
Lead Channel Setting Sensing Sensitivity: 0.5 mV
Zone Setting Detection Interval: 270 ms
Zone Setting Detection Interval: 340 ms

## 2013-11-12 NOTE — Patient Instructions (Signed)
Your physician recommends that you schedule a follow-up appointment in: 1 year with Dr. [name]. You will receive a reminder letter in the mail in about 10 months reminding you to call and schedule your appointment. If you don't receive this letter, please contact our office. Your physician recommends that you continue on your current medications as directed. Please refer to the Current Medication list given to you today. Merlin device check on 02/14/2014.

## 2013-11-12 NOTE — Progress Notes (Addendum)
PCP: Deloria Lair, MD Primary Cardiologist:  Dr Domenic Polite  The patient presents today for routine electrophysiology followup.  Since last being seen in our clinic, the patient reports doing reasonably well.  She cares for her husband with alzheimer disease and finds this to be difficult.  He is up for most of the night and this impacts her sleep a bit.  This causes significant daytime fatigue and sleepiness.  This seems unchanged from last visit.  She has occasional chest pain and SOB.   Today, she denies symptoms of palpitations, chest pain, orthopnea, PND, presyncope, syncope, or neurologic sequela.   The patient feels that she is tolerating medications without difficulties and is otherwise without complaint today.   Past Medical History  Diagnosis Date  . Nonischemic cardiomyopathy     LVEF 35-40% by echo 8/13  . Chronic systolic heart failure   . LBBB (left bundle branch block)   . Hypothyroidism   . UTI (urinary tract infection)     Recurrent  . Glucose intolerance (pre-diabetes)   . Hyperlipemia   . Mitral regurgitation     Mild to Moderate  . Paroxysmal atrial fibrillation     Brief episodes by device interrogation   Past Surgical History  Procedure Laterality Date  . Cholecystectomy    . Total abdominal hysterectomy      (R) ovary removed  . Appendectomy    . Knee arthroscopy      Left  . Cardiac defibrillator placement  09/2009    St. Jude BiV ICD by Dr. Sandria Manly class III    Current Outpatient Prescriptions  Medication Sig Dispense Refill  . aspirin 81 MG tablet Take 1 tablet by mouth daily.      . carvedilol (COREG) 12.5 MG tablet Take 1 tablet (12.5 mg total) by mouth 2 (two) times daily.  180 tablet  3  . cetirizine (ZYRTEC) 10 MG tablet Take 10 mg by mouth daily.       . diazepam (VALIUM) 2 MG tablet Take 2 mg by mouth 3 (three) times daily.       . digoxin (LANOXIN) 0.125 MG tablet Take 1 tablet by mouth daily.       . furosemide (LASIX) 40 MG tablet Take 40  mg by mouth daily.      Marland Kitchen glipiZIDE (GLUCOTROL XL) 2.5 MG 24 hr tablet Take 2.5 mg by mouth daily.        Marland Kitchen levothyroxine (SYNTHROID, LEVOTHROID) 75 MCG tablet Take 1/2 tablet by mouth daily.        Marland Kitchen losartan (COZAAR) 25 MG tablet Take 1/2 tab (12.5mg ) daily  15 tablet  6  . ranitidine (ZANTAC) 150 MG capsule Take 150 mg by mouth daily.       Marland Kitchen spironolactone (ALDACTONE) 25 MG tablet Take 1 tablet by mouth daily.        No current facility-administered medications for this visit.    Allergies  Allergen Reactions  . Bactrim   . Cephalexin   . Clindamycin   . Contrast Media [Iodinated Diagnostic Agents]   . Latex   . Levofloxacin   . Nitrofurantoin Monohyd Macro   . Penicillins     History   Social History  . Marital Status: Married    Spouse Name: N/A    Number of Children: N/A  . Years of Education: N/A   Occupational History  . Not on file.   Social History Main Topics  . Smoking status: Former Smoker -- 0.30 packs/day for  20 years    Types: Cigarettes    Start date: 08/03/1949    Quit date: 01/22/1996  . Smokeless tobacco: Never Used  . Alcohol Use: No  . Drug Use: No  . Sexual Activity: Not on file   Other Topics Concern  . Not on file   Social History Narrative  . No narrative on file    Family History  Problem Relation Age of Onset  . Diabetes Neg Hx   . Hypertension Neg Hx   . Coronary artery disease Neg Hx      Physical Exam: Filed Vitals:   11/12/13 1039  BP: 90/59  Pulse: 85  Height: 5\' 4"  (1.626 m)  Weight: 191 lb (86.637 kg)  SpO2: 96%    GEN- The patient is overweight appearing, alert and oriented x 3 today.   Head- normocephalic, atraumatic Eyes-  Sclera clear, conjunctiva pink Ears- hearing intact Oropharynx- clear Neck- supple,  Lungs- Clear to ausculation bilaterally, normal work of breathing Chest- ICD pocket is well healed Heart- Regular rate and rhythm, no murmurs, rubs or gallops, PMI not laterally displaced GI- soft,  NT, ND, + BS Extremities- no clubbing, cyanosis, trace edema  ICD interrogation- reviewed in detail today,  See PACEART report  Assessment and Plan:  1. Chronic systolic dysfunction stable 2 gram sodium diet encouraged  Normal BiV ICD function See Pace Art report 95% BIV paced.  She recently underwent optimization. She says that she feels that her heart is "too fast" at times with activity.  I have therefore reduced her sensor slope from 11 to 10.  2. Paroxysmal atrial fibrillation Longest episode 1 min 50 seconds Will follow clinically and consider anticoagulation if episodes increase in frequency or duration  Merlin checks every 3 months I will see in a year Followed in [facility] clinic with [name]

## 2014-02-14 ENCOUNTER — Encounter: Payer: Medicare Other | Admitting: *Deleted

## 2014-02-14 ENCOUNTER — Telehealth: Payer: Self-pay | Admitting: Cardiology

## 2014-02-14 NOTE — Telephone Encounter (Signed)
Attempted to confirm remote transmission with pt. No answer and was unable to leave a message.   

## 2014-02-15 ENCOUNTER — Encounter: Payer: Self-pay | Admitting: Cardiology

## 2014-02-25 ENCOUNTER — Telehealth: Payer: Self-pay | Admitting: Cardiology

## 2014-02-25 NOTE — Telephone Encounter (Signed)
Attemtped to call pt and enroll in [facility] clinic. No answer and unable to leave a message.

## 2014-02-25 NOTE — Telephone Encounter (Signed)
-----   Message from Thompson Grayer, MD sent at 10/23/2013  2:25 PM EDT ----- Please enrollin ICM clinc

## 2014-03-01 ENCOUNTER — Telehealth: Payer: Self-pay | Admitting: Cardiology

## 2014-03-01 NOTE — Telephone Encounter (Signed)
-----   Message from Thompson Grayer, MD sent at 10/23/2013  2:25 PM EDT ----- Please enrollin ICM clinc

## 2014-03-01 NOTE — Telephone Encounter (Signed)
Spoke w/ pt daughter and she informed me that pt is taking care of a sick spouse at the hospice house and will be unable to return calls for a while. I informed pt daughter that this is ok to just have pt call me back about the ICM program when she is able to do so. Pt daughter verbalized understanding.

## 2014-03-07 ENCOUNTER — Encounter: Payer: Self-pay | Admitting: Internal Medicine

## 2014-03-07 ENCOUNTER — Telehealth: Payer: Self-pay | Admitting: *Deleted

## 2014-03-07 NOTE — Telephone Encounter (Signed)
Spoke with patient this am to discuss VF episode with shock.  She states "She was at the funeral home attending to details of her husband's funeral when everything went dark ." She was not aware of being shocked.  Denies any symptoms now.  The patient was instructed not to drive and that we would call her back with further instructions after talking with Dr. [name].

## 2014-03-16 ENCOUNTER — Encounter: Payer: Self-pay | Admitting: Internal Medicine

## 2014-03-16 ENCOUNTER — Ambulatory Visit (INDEPENDENT_AMBULATORY_CARE_PROVIDER_SITE_OTHER): Payer: Medicare Other | Admitting: Internal Medicine

## 2014-03-16 VITALS — BP 86/62 | HR 63 | Ht 64.0 in | Wt 187.6 lb

## 2014-03-16 DIAGNOSIS — I428 Other cardiomyopathies: Secondary | ICD-10-CM

## 2014-03-16 DIAGNOSIS — I429 Cardiomyopathy, unspecified: Secondary | ICD-10-CM

## 2014-03-16 DIAGNOSIS — I5022 Chronic systolic (congestive) heart failure: Secondary | ICD-10-CM

## 2014-03-16 DIAGNOSIS — I4891 Unspecified atrial fibrillation: Secondary | ICD-10-CM

## 2014-03-16 DIAGNOSIS — I472 Ventricular tachycardia, unspecified: Secondary | ICD-10-CM

## 2014-03-16 DIAGNOSIS — Z95810 Presence of automatic (implantable) cardiac defibrillator: Secondary | ICD-10-CM

## 2014-03-16 LAB — MDC_IDC_ENUM_SESS_TYPE_INCLINIC
Battery Remaining Longevity: 27.6 mo
Brady Statistic RA Percent Paced: 84 %
Brady Statistic RV Percent Paced: 95 %
Date Time Interrogation Session: 20160224163708
HighPow Impedance: 74.25 Ohm
Implantable Pulse Generator Serial Number: 794739
Lead Channel Impedance Value: 400 Ohm
Lead Channel Impedance Value: 487.5 Ohm
Lead Channel Impedance Value: 525 Ohm
Lead Channel Pacing Threshold Amplitude: 0.5 V
Lead Channel Pacing Threshold Amplitude: 0.5 V
Lead Channel Pacing Threshold Amplitude: 0.625 V
Lead Channel Pacing Threshold Pulse Width: 0.5 ms
Lead Channel Pacing Threshold Pulse Width: 0.5 ms
Lead Channel Pacing Threshold Pulse Width: 0.5 ms
Lead Channel Sensing Intrinsic Amplitude: 11.8 mV
Lead Channel Sensing Intrinsic Amplitude: 4.6 mV
Lead Channel Setting Pacing Amplitude: 2 V
Lead Channel Setting Pacing Amplitude: 2 V
Lead Channel Setting Pacing Amplitude: 2.5 V
Lead Channel Setting Pacing Pulse Width: 0.5 ms
Lead Channel Setting Pacing Pulse Width: 0.5 ms
Lead Channel Setting Sensing Sensitivity: 0.5 mV
Zone Setting Detection Interval: 270 ms
Zone Setting Detection Interval: 340 ms

## 2014-03-16 NOTE — Patient Instructions (Signed)
Your physician recommends that you schedule a follow-up appointment in: 3 months with Dr [name] and next available with Dr Domenic Polite   Your physician recommends that you return for lab work today: BMP/MAG/TSH/DIG/  Your physician has requested that you have a lexiscan myoview. For further information please visit [email or web address]. Please follow instruction sheet, as given.  Your physician has requested that you have an echocardiogram. Echocardiography is a painless test that uses sound waves to create images of your heart. It provides your doctor with information about the size and shape of your heart and how well your heart's chambers and valves are working. This procedure takes approximately one hour. There are no restrictions for this procedure.  Remote monitoring is used to monitor your Pacemaker or ICD from home. This monitoring reduces the number of office visits required to check your device to one time per year. It allows [place] to keep an eye on the functioning of your device to ensure it is working properly. You are scheduled for a device check from home on 06/15/14. You may send your transmission at any time that day. If you have a wireless device, the transmission will be sent automatically. After your physician reviews your transmission, you will receive a postcard with your next transmission date.

## 2014-03-17 ENCOUNTER — Encounter: Payer: Self-pay | Admitting: Internal Medicine

## 2014-03-17 ENCOUNTER — Ambulatory Visit: Payer: Medicare Other | Admitting: Cardiology

## 2014-03-17 DIAGNOSIS — I472 Ventricular tachycardia, unspecified: Secondary | ICD-10-CM | POA: Insufficient documentation

## 2014-03-17 LAB — TSH: TSH: 3.22 u[IU]/mL (ref 0.35–4.50)

## 2014-03-17 LAB — BASIC METABOLIC PANEL
BUN: 20 mg/dL (ref 6–23)
CO2: 29 mEq/L (ref 19–32)
Calcium: 9.5 mg/dL (ref 8.4–10.5)
Chloride: 102 mEq/L (ref 96–112)
Creatinine, Ser: 0.95 mg/dL (ref 0.40–1.20)
GFR: 61.7 mL/min (ref >60.00)
Glucose, Bld: 145 mg/dL — ABNORMAL HIGH (ref 70–99)
Potassium: 4.3 mEq/L (ref 3.5–5.1)
Sodium: 137 mEq/L (ref 135–145)

## 2014-03-17 LAB — MAGNESIUM: Magnesium: 2.2 mg/dL (ref 1.5–2.5)

## 2014-03-17 LAB — DIGOXIN LEVEL: Digoxin Level: 1.1 ng/mL (ref 0.8–2.0)

## 2014-03-17 NOTE — Progress Notes (Signed)
Electrophysiology Office Note   Date:  03/17/2014   ID:  Melissa Paul, DOB 03-21-1945, MRN [id number]  PCP:  Deloria Lair, MD  Cardiologist:  Dr Domenic Polite Primary Electrophysiologist: Thompson Grayer, MD    Chief Complaint  Patient presents with  . ICD shock     History of Present Illness: Melissa Paul is a 68 y.o. female who presents today for electrophysiology evaluation.   The patient is brought in for urgent follow-up after recent remote transmission revealed VT for which she received appropriate ICD therapy  On 03/06/14 she was grieving over the loss of her spouse when she developed VT and received ICD therapy.  She has had a second episode of VT which also required therapy.   She reports recent worsening in SOB and chest discomfort.  Her symptoms occur at rest and also with exertion.  Today, she denies symptoms of palpitations,  orthopnea, PND, lower extremity edema, claudication, dizziness, presyncope, syncope, bleeding, or neurologic sequela. The patient is tolerating medications without difficulties and is otherwise without complaint today.    Past Medical History  Diagnosis Date  . Nonischemic cardiomyopathy     LVEF 35-40% by echo 8/13  . Chronic systolic heart failure   . LBBB (left bundle branch block)   . Hypothyroidism   . UTI (urinary tract infection)     Recurrent  . Glucose intolerance (pre-diabetes)   . Hyperlipemia   . Mitral regurgitation     Mild to Moderate  . Paroxysmal atrial fibrillation     Brief episodes by device interrogation  . Ventricular tachycardia 03/06/14    successful ICD therapy for VT   Past Surgical History  Procedure Laterality Date  . Cholecystectomy    . Total abdominal hysterectomy      (R) ovary removed  . Appendectomy    . Knee arthroscopy      Left  . Cardiac defibrillator placement  09/2009    St. Jude BiV ICD by Dr. Sandria Manly class III     Current Outpatient Prescriptions  Medication Sig Dispense Refill  .  aspirin 81 MG tablet Take 1 tablet by mouth daily.    . carvedilol (COREG) 12.5 MG tablet Take 1 tablet (12.5 mg total) by mouth 2 (two) times daily. 180 tablet 3  . cetirizine (ZYRTEC) 10 MG tablet Take 10 mg by mouth daily as needed for allergies or rhinitis.     Marland Kitchen diazepam (VALIUM) 2 MG tablet Take 2 mg by mouth 3 (three) times daily.     . digoxin (LANOXIN) 0.125 MG tablet Take 1 tablet by mouth daily.     . furosemide (LASIX) 40 MG tablet Take 40 mg by mouth daily.    Marland Kitchen levothyroxine (SYNTHROID, LEVOTHROID) 75 MCG tablet Take 1/2 tablet by mouth daily.      Marland Kitchen losartan (COZAAR) 25 MG tablet Take 1/2 tab (12.5mg ) daily (Patient taking differently: Take 1/2 tab (12.5mg ) by mouth daily) 15 tablet 6  . ondansetron (ZOFRAN) 4 MG tablet Take 4 mg by mouth every 8 (eight) hours as needed for nausea or vomiting.    . ranitidine (ZANTAC) 150 MG capsule Take 150 mg by mouth daily.     Marland Kitchen spironolactone (ALDACTONE) 25 MG tablet Take 1 tablet by mouth daily.      No current facility-administered medications for this visit.    Allergies:   Bactrim; Cephalexin; Clindamycin; Contrast media; Latex; Levofloxacin; Nitrofurantoin monohyd macro; and Penicillins   Social History:  The patient  reports that  she quit smoking about 18 years ago. Her smoking use included Cigarettes. She started smoking about 64 years ago. She has a 6 pack-year smoking history. She has never used smokeless tobacco. She reports that she does not drink alcohol or use illicit drugs.   Family History:  The patient's family history is negative for Diabetes, Hypertension, and Coronary artery disease.    ROS:  Please see the history of present illness.   All other systems are reviewed and negative.    PHYSICAL EXAM: VS:  BP 86/62 mmHg  Pulse 63  Ht 5\' 4"  (1.626 m)  Wt 187 lb 9.6 oz (85.095 kg)  BMI 32.19 kg/m2 , BMI Body mass index is 32.19 kg/(m^2). GEN: Well nourished, well developed, in no acute distress HEENT: normal Neck:  no JVD, carotid bruits, or masses Cardiac: RRR; no murmurs, rubs, or gallops,no edema  Respiratory:  clear to auscultation bilaterally, normal work of breathing GI: soft, nontender, nondistended, + BS MS: no deformity or atrophy Skin: warm and dry, device pocket is well healed Neuro:  Strength and sensation are intact Psych: euthymic mood, full affect  EKG:  EKG is ordered today. The ekg ordered today shows sinus rhythm with BiV pacing  Device interrogation is reviewed today in detail.  See PaceArt for details.   Recent Labs: 03/16/2014: BUN 20; Creatinine 0.95; Magnesium 2.2; Potassium 4.3; Sodium 137; TSH 3.22    Lipid Panel     Component Value Date/Time   CHOL  04/11/2007 0600    139        ATP III CLASSIFICATION:  <200     mg/dL   Desirable  200-239  mg/dL   Borderline High  >=240    mg/dL   High   TRIG 51 04/11/2007 0600   HDL 42 04/11/2007 0600   CHOLHDL 3.3 04/11/2007 0600   VLDL 10 04/11/2007 0600   LDLCALC  04/11/2007 0600    87        Total Cholesterol/HDL:CHD Risk Coronary Heart Disease Risk Table                     Men   Women  1/2 Average Risk   3.4   3.3     Wt Readings from Last 3 Encounters:  03/16/14 187 lb 9.6 oz (85.095 kg)  11/12/13 191 lb (86.637 kg)  09/16/13 196 lb (88.905 kg)      Other studies Reviewed: Additional studies/ records that were reviewed today include: Dr Chuck Hint previous notes    ASSESSMENT AND PLAN:  1.  VT - new problem The patient presents today with recent ICD therapy for VT.  She is at high risk for decompensation/ hospitalization from her arrhythmia.  A high level of decision making is required for the visit today Bmet, Mg, TFTs, dig level today myoview to evaluate for ischemia as the cause Echo to evaluate for response to CRT and also evaluate EF  2. SOB/ chest discomfort In the setting of VT, this is very worrisome for ischemia lexiscan myoview is ordered today  3. Nonischemic CM/ chronic systolic  dysfunction SOB but without significant volume overload on exam bmet  4. AFib Well controlled No changes Episodes have been very brief chads2vasc is 4.  If afib burden increases, will consider anticoagulation.  She will need close follow-up with Dr Domenic Polite Follow closely with remote monitoring for further ventricular arrhythmias.  IF she has further arrhythmias, will need to consider AAD therapy I will see again in  3 months   Current medicines are reviewed at length with the patient today.   The patient does not have concerns regarding her medicines.  The following changes were made today:  none  Labs/ tests ordered today include:  Orders Placed This Encounter  Procedures  . Basic metabolic panel  . Magnesium  . TSH  . Digoxin level  . Myocardial Perfusion Imaging  . Implantable device check  . EKG 12-Lead  . 2D Echocardiogram without contrast  Signed, Thompson Grayer, MD  03/17/2014 9:31 PM     [address] [place] [address] [phone] (office) [phone] (fax)

## 2014-03-22 ENCOUNTER — Other Ambulatory Visit: Payer: Self-pay | Admitting: Internal Medicine

## 2014-03-22 DIAGNOSIS — R079 Chest pain, unspecified: Secondary | ICD-10-CM

## 2014-03-24 ENCOUNTER — Ambulatory Visit (HOSPITAL_COMMUNITY)
Admission: RE | Admit: 2014-03-24 | Discharge: 2014-03-24 | Disposition: A | Discharge status: Home / Self Care | Payer: Medicare Other | Source: Ambulatory Visit | Attending: Internal Medicine | Admitting: Internal Medicine

## 2014-03-24 ENCOUNTER — Inpatient Hospital Stay (HOSPITAL_COMMUNITY): Admission: RE | Admit: 2014-03-24 | Payer: Medicare Other | Source: Ambulatory Visit

## 2014-03-24 ENCOUNTER — Encounter (HOSPITAL_COMMUNITY): Admission: RE | Admit: 2014-03-24 | Payer: Medicare Other | Source: Ambulatory Visit

## 2014-03-24 ENCOUNTER — Encounter (HOSPITAL_COMMUNITY)
Admission: RE | Admit: 2014-03-24 | Discharge: 2014-03-24 | Disposition: A | Discharge status: Home / Self Care | Payer: Medicare Other | Source: Ambulatory Visit | Attending: Internal Medicine | Admitting: Internal Medicine

## 2014-03-24 DIAGNOSIS — I4891 Unspecified atrial fibrillation: Secondary | ICD-10-CM

## 2014-03-24 DIAGNOSIS — I5022 Chronic systolic (congestive) heart failure: Secondary | ICD-10-CM

## 2014-03-24 DIAGNOSIS — R079 Chest pain, unspecified: Secondary | ICD-10-CM

## 2014-03-24 NOTE — Progress Notes (Signed)
*  PRELIMINARY RESULTS* Echocardiogram 2D Echocardiogram has been performed.  Leavy Cella 03/24/2014, 10:37 AM

## 2014-03-25 ENCOUNTER — Telehealth: Payer: Self-pay | Admitting: *Deleted

## 2014-03-25 NOTE — Telephone Encounter (Signed)
Merlin alert received for possible back up VVI mode.Complete interrogation may not be possible. Pt was called and left message on machine to try and get scheduled for this pm. Routing to Center.

## 2014-03-27 ENCOUNTER — Telehealth: Payer: Self-pay | Admitting: Nurse Practitioner

## 2014-03-27 NOTE — Telephone Encounter (Signed)
Pt with device reset alert on Merlin.  Spoke with patient today.  She has noticed no change in symptoms.  She has appts in the morning, but will come to [facility] tomorrow afternoon to have device interrogated. STJ rep aware. ER precautions given.   Chanetta Marshall, NP 03/27/2014 10:59 AM

## 2014-03-28 ENCOUNTER — Telehealth: Payer: Self-pay | Admitting: Nurse Practitioner

## 2014-03-28 ENCOUNTER — Encounter: Payer: Medicare Other | Admitting: *Deleted

## 2014-03-28 NOTE — Telephone Encounter (Signed)
Spoke w/pt to let know  Transmission was received.

## 2014-03-28 NOTE — Telephone Encounter (Signed)
Device interrogation today in clinic demonstrated normal device function. Alert was due to inability to send full Merlin transmission last week.  Left message for patient to send remote transmission today when she gets home.  Will ask device clinic to follow-up and call patient to let her know transmission received.   Chanetta Marshall, NP 03/28/2014 3:00 PM

## 2014-03-31 ENCOUNTER — Encounter (HOSPITAL_COMMUNITY)
Admission: RE | Admit: 2014-03-31 | Discharge: 2014-03-31 | Disposition: A | Discharge status: Home / Self Care | Payer: Medicare Other | Source: Ambulatory Visit | Attending: Internal Medicine | Admitting: Internal Medicine

## 2014-03-31 ENCOUNTER — Encounter (HOSPITAL_COMMUNITY): Payer: Self-pay

## 2014-03-31 ENCOUNTER — Ambulatory Visit (HOSPITAL_COMMUNITY)
Admission: RE | Admit: 2014-03-31 | Discharge: 2014-03-31 | Disposition: A | Discharge status: Home / Self Care | Payer: Medicare Other | Source: Ambulatory Visit | Attending: Internal Medicine | Admitting: Internal Medicine

## 2014-03-31 DIAGNOSIS — I429 Cardiomyopathy, unspecified: Secondary | ICD-10-CM | POA: Diagnosis not present

## 2014-03-31 DIAGNOSIS — R0602 Shortness of breath: Secondary | ICD-10-CM | POA: Insufficient documentation

## 2014-03-31 DIAGNOSIS — R9439 Abnormal result of other cardiovascular function study: Secondary | ICD-10-CM | POA: Insufficient documentation

## 2014-03-31 DIAGNOSIS — I472 Ventricular tachycardia: Secondary | ICD-10-CM | POA: Diagnosis not present

## 2014-03-31 DIAGNOSIS — R079 Chest pain, unspecified: Secondary | ICD-10-CM | POA: Insufficient documentation

## 2014-03-31 DIAGNOSIS — I340 Nonrheumatic mitral (valve) insufficiency: Secondary | ICD-10-CM | POA: Insufficient documentation

## 2014-03-31 DIAGNOSIS — I447 Left bundle-branch block, unspecified: Secondary | ICD-10-CM | POA: Diagnosis not present

## 2014-03-31 DIAGNOSIS — R0789 Other chest pain: Secondary | ICD-10-CM | POA: Insufficient documentation

## 2014-03-31 DIAGNOSIS — I480 Paroxysmal atrial fibrillation: Secondary | ICD-10-CM | POA: Diagnosis not present

## 2014-03-31 MED ORDER — TECHNETIUM TC 99M SESTAMIBI GENERIC - CARDIOLITE
10.0000 | Freq: Once | INTRAVENOUS | Status: AC | PRN
  Administered 2014-03-31: 10 via INTRAVENOUS

## 2014-03-31 MED ORDER — SODIUM CHLORIDE 0.9 % IJ SOLN
INTRAMUSCULAR | Status: AC
  Administered 2014-03-31: 10 mL via INTRAVENOUS
  Filled 2014-03-31: qty 3

## 2014-03-31 MED ORDER — REGADENOSON 0.4 MG/5ML IV SOLN
INTRAVENOUS | Status: AC
  Administered 2014-03-31: 0.4 mg via INTRAVENOUS
  Filled 2014-03-31: qty 5

## 2014-03-31 MED ORDER — REGADENOSON 0.4 MG/5ML IV SOLN
0.4000 mg | Freq: Once | INTRAVENOUS | Status: AC | PRN
  Administered 2014-03-31: 0.4 mg via INTRAVENOUS

## 2014-03-31 MED ORDER — SODIUM CHLORIDE 0.9 % IJ SOLN
10.0000 mL | INTRAMUSCULAR | Status: DC | PRN
  Administered 2014-03-31: 10 mL via INTRAVENOUS
  Filled 2014-03-31: qty 10

## 2014-03-31 MED ORDER — TECHNETIUM TC 99M SESTAMIBI - CARDIOLITE
30.0000 | Freq: Once | INTRAVENOUS | Status: AC | PRN
  Administered 2014-03-31: 30 via INTRAVENOUS

## 2014-03-31 NOTE — Progress Notes (Signed)
Stress Lab Nurses Notes - [facility] 03/31/2014 Reason for doing test: VT, SOB & chest discomfort Type of test: Wille Glaser Nurse performing test: Gerrit Halls, RN Nuclear Medicine Tech: Redmond Baseman Echo Tech: Not Applicable MD performing test: S. McDowell/K.Purcell Nails NP Family MD: Scotty Court Test explained and consent signed: Yes.   IV started: No redness or edema and Saline lock started in radiology Symptoms: none Treatment/Intervention: None Reason test stopped: protocol completed After recovery IV was: Discontinued via X-ray tech and No redness or edema Patient to return to Nuc. Med at : 10:00 Patient discharged: Home Patient's Condition upon discharge was: stable Comments:During test BP 118/62 & HR 73 .  Recovery BP 100/63 & HR 63 .  Symptoms resolved in recovery.  Geanie Cooley T

## 2014-04-05 ENCOUNTER — Encounter: Payer: Self-pay | Admitting: Internal Medicine

## 2014-04-07 ENCOUNTER — Other Ambulatory Visit: Payer: Self-pay | Admitting: Cardiology

## 2014-04-07 ENCOUNTER — Encounter: Payer: Self-pay | Admitting: *Deleted

## 2014-04-07 ENCOUNTER — Telehealth: Payer: Self-pay | Admitting: Cardiology

## 2014-04-07 ENCOUNTER — Ambulatory Visit (INDEPENDENT_AMBULATORY_CARE_PROVIDER_SITE_OTHER): Payer: Medicare Other | Admitting: Cardiology

## 2014-04-07 ENCOUNTER — Encounter: Payer: Self-pay | Admitting: Cardiology

## 2014-04-07 VITALS — BP 100/62 | HR 69 | Ht 64.0 in | Wt 189.0 lb

## 2014-04-07 DIAGNOSIS — Z95810 Presence of automatic (implantable) cardiac defibrillator: Secondary | ICD-10-CM | POA: Diagnosis not present

## 2014-04-07 DIAGNOSIS — I428 Other cardiomyopathies: Secondary | ICD-10-CM

## 2014-04-07 DIAGNOSIS — R072 Precordial pain: Secondary | ICD-10-CM

## 2014-04-07 DIAGNOSIS — R9439 Abnormal result of other cardiovascular function study: Secondary | ICD-10-CM

## 2014-04-07 DIAGNOSIS — I429 Cardiomyopathy, unspecified: Secondary | ICD-10-CM

## 2014-04-07 DIAGNOSIS — I340 Nonrheumatic mitral (valve) insufficiency: Secondary | ICD-10-CM

## 2014-04-07 MED ORDER — PREDNISONE 20 MG PO TABS: ORAL_TABLET | ORAL | Status: DC

## 2014-04-07 NOTE — Telephone Encounter (Signed)
Pt has Medicare and Medicaid.  No precert required. °

## 2014-04-07 NOTE — Addendum Note (Signed)
Addended by: Laurine Blazer on: 04/07/2014 09:03 AM   Modules accepted: Orders

## 2014-04-07 NOTE — Addendum Note (Signed)
Addended by: Laurine Blazer on: 04/07/2014 09:11 AM   Modules accepted: Orders

## 2014-04-07 NOTE — Patient Instructions (Signed)
Continue all current medications. Your physician has requested that you have a cardiac catheterization. Cardiac catheterization is used to diagnose and/or treat various heart conditions. Doctors may recommend this procedure for a number of different reasons. The most common reason is to evaluate chest pain. Chest pain can be a symptom of coronary artery disease (CAD), and cardiac catheterization can show whether plaque is narrowing or blocking your heart's arteries. This procedure is also used to evaluate the valves, as well as measure the blood flow and oxygen levels in different parts of your heart. For further information please visit [email or web address]. Please follow instruction sheet, as given. Follow up will be given at time of discharge from above.

## 2014-04-07 NOTE — Telephone Encounter (Signed)
R & L heart cath - 04/12/14 - 7:30 - Dr. Ellyn Hack

## 2014-04-07 NOTE — Progress Notes (Signed)
Cardiology Office Note  Date: 04/07/2014   ID: Melissa Paul, DOB 05/20/1945, MRN [id number]  PCP: Deloria Lair, MD  Primary Cardiologist: Rozann Lesches, MD   Chief Complaint  Patient presents with  . Cardiomyopathy  . Atrial Fibrillation    History of Present Illness: Melissa Paul is a 69 y.o. female last seen by me in August 2015. Interval visit with Dr. [name] noted in 04/20/2022. Note indicates that he was concerned about possible ischemic symptoms including chest pain and shortness of breath as well as ICD discharges for VT, and referred her for a Cardiolite study. Full results are outlined below, LVEF was 41% with global hypokinesis, and there were potential areas of mild ischemia noted in the basal inferoseptal and mid to apical anterolateral walls (although in the setting of ventricular pacing, left bundle branch block, and prior documented nonischemic cardiomyopathy - cardiac catheterization in 2009 revealed normal coronary arteries).  She is here with her daughter today. We discussed the results of the stress test ordered by Dr. [name]. We also reviewed her symptoms. Her husband recently passed away in 04-20-2022 after a protracted illness, he was in Hospice. Her episodes of VT were around this time. Unrelated to this event however, she has been experiencing intermittent chest tightness, not predictable or necessarily with exertion. She has occasional palpitations. She reports NYHA class 2-3 dyspnea which is not new.  We discussed the possibility of proceeding with a left and right heart catheterization to clearly reassess her coronary anatomy and also evaluate hemodynamics. She is on a good medical regimen for cardiomyopathy, blood pressure normal today, and with stable weight. After discussing the anticipated benefits and potential risks, she is in agreement to proceed.   Past Medical History  Diagnosis Date  . Nonischemic cardiomyopathy     LVEF 35-40% by echo 8/13    . Chronic systolic heart failure   . LBBB (left bundle branch block)   . Hypothyroidism   . UTI (urinary tract infection)     Recurrent  . Glucose intolerance (pre-diabetes)   . Hyperlipemia   . Mitral regurgitation     Mild to Moderate  . Paroxysmal atrial fibrillation     Brief episodes by device interrogation  . Ventricular tachycardia     ICD therapy for VT    Past Surgical History  Procedure Laterality Date  . Cholecystectomy    . Total abdominal hysterectomy      (R) ovary removed  . Appendectomy    . Knee arthroscopy      Left  . Cardiac defibrillator placement  09/2009    St. Jude BiV ICD by Dr. Sandria Manly class III    Current Outpatient Prescriptions  Medication Sig Dispense Refill  . aspirin 81 MG tablet Take 1 tablet by mouth daily.    . carvedilol (COREG) 12.5 MG tablet Take 1 tablet (12.5 mg total) by mouth 2 (two) times daily. 180 tablet 3  . cetirizine (ZYRTEC) 10 MG tablet Take 10 mg by mouth daily as needed for allergies or rhinitis.     Marland Kitchen diazepam (VALIUM) 2 MG tablet Take 2 mg by mouth 3 (three) times daily.     . digoxin (LANOXIN) 0.125 MG tablet Take 1 tablet by mouth daily.     . furosemide (LASIX) 40 MG tablet Take 40 mg by mouth daily.    Marland Kitchen levothyroxine (SYNTHROID, LEVOTHROID) 75 MCG tablet Take 1/2 tablet by mouth daily.      Marland Kitchen losartan (COZAAR) 25 MG  tablet Take 1/2 tab (12.5mg ) daily (Patient taking differently: Take 1/2 tab (12.5mg ) by mouth daily) 15 tablet 6  . ondansetron (ZOFRAN) 4 MG tablet Take 4 mg by mouth every 8 (eight) hours as needed for nausea or vomiting.    . ranitidine (ZANTAC) 150 MG capsule Take 150 mg by mouth daily.     Marland Kitchen spironolactone (ALDACTONE) 25 MG tablet Take 1 tablet by mouth daily.      No current facility-administered medications for this visit.    Allergies:  Bactrim; Cephalexin; Clindamycin; Contrast media; Latex; Levofloxacin; Nitrofurantoin monohyd macro; and Penicillins   Social History: The patient   reports that she quit smoking about 18 years ago. Her smoking use included Cigarettes. She started smoking about 64 years ago. She has a 6 pack-year smoking history. She has never used smokeless tobacco. She reports that she does not drink alcohol or use illicit drugs.   Family History: The patient's family history is negative for Diabetes, Hypertension, and Coronary artery disease.   ROS:  Please see the history of present illness. Otherwise, complete review of systems is positive for occasional palpitations, no frank syncope. Chronic leg edema..  All other systems are reviewed and negative.    Physical Exam: VS:  BP 100/62 mmHg  Pulse 69  Ht 5\' 4"  (1.626 m)  Wt 189 lb (85.73 kg)  BMI 32.43 kg/m2  SpO2 99%, BMI Body mass index is 32.43 kg/(m^2).  Wt Readings from Last 3 Encounters:  04/07/14 189 lb (85.73 kg)  03/16/14 187 lb 9.6 oz (85.095 kg)  11/12/13 191 lb (86.637 kg)      Overweight woman, appears comfortable at rest.  HEENT: Conjunctiva and lids normal, oropharynx clear with poor dentition. Neck: Supple, no elevated JVP, no carotid bruits, no thyromegaly.  Lungs: Clear to auscultation with decreased breath sounds, nonlabored breathing at rest.  Cardiac: Regular rate and rhythm, no S3 or significant systolic murmur, no pericardial rub.  Abdomen: Soft, nontender, bowel sounds present, no guarding or rebound.  Extremities: 1+ edema and adipose tissue, stasis. Skin: Warm and dry. Musculoskeletal: No kyphosis. Neuropsychiatric: Alert and oriented 3, affect appropriate.    ECG: Tracing from 03/16/2014 showed normal sinus rhythm with ventricular pacing.  Recent Labwork: 03/16/2014: BUN 20; Creatinine 0.95; Magnesium 2.2; Potassium 4.3; Sodium 137; TSH 3.22   Other Studies Reviewed Today:  1. Echocardiogram 03/24/2014: Study Conclusions  - Left ventricle: Systolic function was moderately reduced. Estimated LVEF 40%. Global hypokinesis was seen. The cavity size was  normal. Wall thickness was increased in a pattern of mild LVH. Features are consistent with a pseudonormal left ventricular filling pattern, with concomitant abnormal relaxation and increased filling pressure (grade 2 diastolic dysfunction). Doppler parameters are consistent with high ventricular filling pressure. - Left atrium: The atrium was mildly dilated. Volume/bsa, ES, (1-plane Simpson&'s, A2C): 30.3 ml/m^2. - Right ventricle: Pacer wire or catheter noted in right ventricle. Systolic function was mildly reduced. - Right atrium: The atrium was mildly dilated. Pacer wire or catheter noted in right atrium. - Tricuspid valve: There was mild regurgitation.  2. Lexiscan Cardiolite 03/31/2014: MYOCARDIAL IMAGING WITH SPECT (REST AND PHARMACOLOGIC-STRESS)  GATED LEFT VENTRICULAR WALL MOTION STUDY  LEFT VENTRICULAR EJECTION FRACTION  TECHNIQUE: Standard myocardial SPECT imaging was performed after resting intravenous injection of 10 mCi Tc-1m sestamibi. Subsequently, intravenous infusion of Lexiscan was performed under the supervision of the Cardiology staff. At peak effect of the drug, 30 mCi Tc-36m sestamibi was injected intravenously and standard myocardial SPECT imaging was performed.  Quantitative gated imaging was also performed to evaluate left ventricular wall motion, and estimate left ventricular ejection fraction.  FINDINGS: Baseline tracing shows a dual-chamber paced rhythm. Lexiscan bolus was given in standard fashion. Heart rate increased from 63 beats per min up to 75 beats per min, and blood pressure increased from 117/80 up to 128/61. No chest pain was reported. Ventricular pacing was present throughout the study with occasional PVCs. There were no sustained arrhythmias.  Analysis of the raw perfusion data shows heterogeneous radiotracer uptake within the myocardium as well as breast attenuation artifact.  Perfusion: There is a small, mild  intensity, partially reversible basal inferoseptal defect as well as a small, mild intensity, partially reversible mid to apical anterolateral defect.  Wall Motion: Global left ventricular hypokinesis.  Left Ventricular Ejection Fraction: 41 %  End diastolic volume 497 ml  End systolic volume 85 ml  IMPRESSION: 1. Abnormal myocardial perfusion imaging with areas of potential mild ischemia in the basal inferoseptal as well as mid apical anterolateral walls. Patient's history includes nonischemic cardiomyopathy, left bundle branch block, and ventricular pacing which could lead to perfusion abnormalities as well. This study does not however exclude underlying CAD.  2. There is global left ventricular hypokinesis.  3. Left ventricular ejection fraction 41%  4. Intermediate-risk stress test findings*.   Assessment and Plan:  1. Abnormal Cardiolite as outlined above, potential mild ischemia noted in the basal inferoseptal as well as mid to apical anterolateral walls, although in the presence of ventricular pacing, history of left bundle-branch block, and previously documented nonischemic cardiomyopathy. She has had intermittent chest pain symptoms and shortness of breath as well as VT requiring ICD shocks. After above discussion, plan is to arrange an outpatient left and right heart catheterization for reassessment of the coronary anatomy and hemodynamics. She is in agreement to proceed.  2. Nonischemic cardiomyopathy, LVEF approximately 40%, documentation of normal coronary arteries at cardiac catheterization 2009.  3. St. Jude biventricular ICD in place, followed by Dr. [name].  4. Mitral regurgitation Described as trivial on recent echocardiogram. Previously mild to moderate.  Current medicines are reviewed at length with the patient today.  The patient does not have concerns regarding medicines.   Disposition: FU with me in 2-3 weeks.   Signed, Satira Sark,  MD, [facility] 04/07/2014 8:52 AM    [facility] [place] [place], [place], [place] [address] Phone: [phone]; Fax: [phone]

## 2014-04-11 DIAGNOSIS — R0609 Other forms of dyspnea: Secondary | ICD-10-CM

## 2014-04-11 DIAGNOSIS — R9439 Abnormal result of other cardiovascular function study: Secondary | ICD-10-CM

## 2014-04-11 DIAGNOSIS — R0600 Dyspnea, unspecified: Secondary | ICD-10-CM

## 2014-04-12 ENCOUNTER — Ambulatory Visit (HOSPITAL_COMMUNITY)
Admission: RE | Admit: 2014-04-12 | Discharge: 2014-04-12 | Disposition: A | Discharge status: Home / Self Care | Payer: Medicare Other | Source: Ambulatory Visit | Attending: Cardiology | Admitting: Cardiology

## 2014-04-12 ENCOUNTER — Encounter (HOSPITAL_COMMUNITY)
Admission: RE | Disposition: A | Discharge status: Home / Self Care | Payer: Self-pay | Source: Ambulatory Visit | Attending: Cardiology

## 2014-04-12 ENCOUNTER — Encounter (HOSPITAL_COMMUNITY): Payer: Self-pay | Admitting: Cardiology

## 2014-04-12 DIAGNOSIS — I340 Nonrheumatic mitral (valve) insufficiency: Secondary | ICD-10-CM | POA: Insufficient documentation

## 2014-04-12 DIAGNOSIS — E039 Hypothyroidism, unspecified: Secondary | ICD-10-CM | POA: Insufficient documentation

## 2014-04-12 DIAGNOSIS — Z79899 Other long term (current) drug therapy: Secondary | ICD-10-CM | POA: Diagnosis not present

## 2014-04-12 DIAGNOSIS — I4891 Unspecified atrial fibrillation: Secondary | ICD-10-CM | POA: Insufficient documentation

## 2014-04-12 DIAGNOSIS — E785 Hyperlipidemia, unspecified: Secondary | ICD-10-CM | POA: Diagnosis not present

## 2014-04-12 DIAGNOSIS — Z91041 Radiographic dye allergy status: Secondary | ICD-10-CM | POA: Insufficient documentation

## 2014-04-12 DIAGNOSIS — I447 Left bundle-branch block, unspecified: Secondary | ICD-10-CM | POA: Insufficient documentation

## 2014-04-12 DIAGNOSIS — I429 Cardiomyopathy, unspecified: Secondary | ICD-10-CM | POA: Diagnosis not present

## 2014-04-12 DIAGNOSIS — Z95810 Presence of automatic (implantable) cardiac defibrillator: Secondary | ICD-10-CM | POA: Diagnosis not present

## 2014-04-12 DIAGNOSIS — Z7982 Long term (current) use of aspirin: Secondary | ICD-10-CM | POA: Diagnosis not present

## 2014-04-12 DIAGNOSIS — Z87891 Personal history of nicotine dependence: Secondary | ICD-10-CM | POA: Insufficient documentation

## 2014-04-12 DIAGNOSIS — R0609 Other forms of dyspnea: Secondary | ICD-10-CM | POA: Diagnosis not present

## 2014-04-12 DIAGNOSIS — R931 Abnormal findings on diagnostic imaging of heart and coronary circulation: Secondary | ICD-10-CM

## 2014-04-12 DIAGNOSIS — R0600 Dyspnea, unspecified: Secondary | ICD-10-CM | POA: Insufficient documentation

## 2014-04-12 DIAGNOSIS — I428 Other cardiomyopathies: Secondary | ICD-10-CM

## 2014-04-12 DIAGNOSIS — R9439 Abnormal result of other cardiovascular function study: Secondary | ICD-10-CM | POA: Diagnosis not present

## 2014-04-12 DIAGNOSIS — I472 Ventricular tachycardia, unspecified: Secondary | ICD-10-CM

## 2014-04-12 DIAGNOSIS — I480 Paroxysmal atrial fibrillation: Secondary | ICD-10-CM | POA: Insufficient documentation

## 2014-04-12 HISTORY — PX: LEFT AND RIGHT HEART CATHETERIZATION WITH CORONARY ANGIOGRAM: SHX5449

## 2014-04-12 LAB — PROTIME-INR
INR: 1.04 (ref 0.00–1.49)
Prothrombin Time: 13.7 seconds (ref 11.6–15.2)

## 2014-04-12 LAB — BASIC METABOLIC PANEL
Anion gap: 9 (ref 5–15)
BUN: 18 mg/dL (ref 6–23)
CO2: 27 mmol/L (ref 19–32)
Calcium: 9.4 mg/dL (ref 8.4–10.5)
Chloride: 99 mmol/L (ref 96–112)
Creatinine, Ser: 0.97 mg/dL (ref 0.50–1.10)
GFR calc Af Amer: 67 mL/min — ABNORMAL LOW (ref >90)
GFR calc non Af Amer: 58 mL/min — ABNORMAL LOW (ref >90)
Glucose, Bld: 204 mg/dL — ABNORMAL HIGH (ref 70–99)
Potassium: 4.1 mmol/L (ref 3.5–5.1)
Sodium: 135 mmol/L (ref 135–145)

## 2014-04-12 LAB — CBC
HCT: 40.6 % (ref 36.0–46.0)
Hemoglobin: 14.4 g/dL (ref 12.0–15.0)
MCH: 31.1 pg (ref 26.0–34.0)
MCHC: 35.5 g/dL (ref 30.0–36.0)
MCV: 87.7 fL (ref 78.0–100.0)
Platelets: 256 10*3/uL (ref 150–400)
RBC: 4.63 MIL/uL (ref 3.87–5.11)
RDW: 13.3 % (ref 11.5–15.5)
WBC: 9.3 10*3/uL (ref 4.0–10.5)

## 2014-04-12 SURGERY — LEFT AND RIGHT HEART CATHETERIZATION WITH CORONARY ANGIOGRAM
Anesthesia: LOCAL

## 2014-04-12 MED ORDER — SODIUM CHLORIDE 0.9 % IV SOLN: 250.0000 mL | INTRAVENOUS | Status: DC | PRN

## 2014-04-12 MED ORDER — DIPHENHYDRAMINE HCL 50 MG/ML IJ SOLN
INTRAMUSCULAR | Status: AC
  Filled 2014-04-12: qty 1

## 2014-04-12 MED ORDER — DIPHENHYDRAMINE HCL 50 MG/ML IJ SOLN
25.0000 mg | INTRAMUSCULAR | Status: AC
  Administered 2014-04-12: 25 mg via INTRAVENOUS

## 2014-04-12 MED ORDER — FAMOTIDINE 20 MG PO TABS
20.0000 mg | ORAL_TABLET | ORAL | Status: AC
  Administered 2014-04-12: 20 mg via ORAL

## 2014-04-12 MED ORDER — MIDAZOLAM HCL 2 MG/2ML IJ SOLN
INTRAMUSCULAR | Status: AC
Start: 2014-04-12 — End: 2014-04-12
  Filled 2014-04-12: qty 2

## 2014-04-12 MED ORDER — PREDNISONE 20 MG PO TABS: 60.0000 mg | ORAL_TABLET | Freq: Once | ORAL | Status: DC

## 2014-04-12 MED ORDER — FAMOTIDINE 20 MG PO TABS
ORAL_TABLET | ORAL | Status: AC
  Filled 2014-04-12: qty 1

## 2014-04-12 MED ORDER — SODIUM CHLORIDE 0.9 % IV SOLN
INTRAVENOUS | Status: DC
  Administered 2014-04-12: 07:00:00 via INTRAVENOUS

## 2014-04-12 MED ORDER — ONDANSETRON HCL 4 MG/2ML IJ SOLN: 4.0000 mg | Freq: Four times a day (QID) | INTRAMUSCULAR | Status: DC | PRN

## 2014-04-12 MED ORDER — SODIUM CHLORIDE 0.9 % IJ SOLN: 3.0000 mL | Freq: Two times a day (BID) | INTRAMUSCULAR | Status: DC

## 2014-04-12 MED ORDER — ONDANSETRON HCL 4 MG/2ML IJ SOLN
INTRAMUSCULAR | Status: AC
  Filled 2014-04-12: qty 2

## 2014-04-12 MED ORDER — NITROGLYCERIN 1 MG/10 ML FOR IR/CATH LAB
INTRA_ARTERIAL | Status: AC
  Filled 2014-04-12: qty 10

## 2014-04-12 MED ORDER — LIDOCAINE HCL (PF) 1 % IJ SOLN
INTRAMUSCULAR | Status: AC
Start: 2014-04-12 — End: 2014-04-12
  Filled 2014-04-12: qty 30

## 2014-04-12 MED ORDER — VERAPAMIL HCL 2.5 MG/ML IV SOLN
INTRAVENOUS | Status: AC
  Filled 2014-04-12: qty 2

## 2014-04-12 MED ORDER — HEPARIN (PORCINE) IN NACL 2-0.9 UNIT/ML-% IJ SOLN
INTRAMUSCULAR | Status: AC
  Filled 2014-04-12: qty 1000

## 2014-04-12 MED ORDER — SODIUM CHLORIDE 0.9 % IV SOLN: INTRAVENOUS | Status: DC

## 2014-04-12 MED ORDER — SODIUM CHLORIDE 0.9 % IJ SOLN: 3.0000 mL | INTRAMUSCULAR | Status: DC | PRN

## 2014-04-12 MED ORDER — ASPIRIN 81 MG PO CHEW: 81.0000 mg | CHEWABLE_TABLET | ORAL | Status: DC

## 2014-04-12 MED ORDER — FENTANYL CITRATE 0.05 MG/ML IJ SOLN
INTRAMUSCULAR | Status: AC
  Filled 2014-04-12: qty 2

## 2014-04-12 MED ORDER — HEPARIN SODIUM (PORCINE) 1000 UNIT/ML IJ SOLN
INTRAMUSCULAR | Status: AC
  Filled 2014-04-12: qty 1

## 2014-04-12 MED ORDER — ACETAMINOPHEN 325 MG PO TABS: 650.0000 mg | ORAL_TABLET | ORAL | Status: DC | PRN

## 2014-04-12 NOTE — Progress Notes (Signed)
TR BAND REMOVAL  LOCATION:    right radial  DEFLATED PER PROTOCOL:    Yes.    TIME BAND OFF / DRESSING APPLIED:    1040   SITE UPON ARRIVAL:    Level 0  SITE AFTER BAND REMOVAL:    Level 0  CIRCULATION SENSATION AND MOVEMENT:    Within Normal Limits   Yes.    COMMENTS:   TRB REMOVED/ TEGADERM DSG APPLIED

## 2014-04-12 NOTE — Interval H&P Note (Signed)
History and Physical Interval Note:  04/12/2014 7:37 AM  Melissa Paul  has presented today for surgery, with the diagnosis of abnormal stress test, cp, cardiomyopathy & Class III Dyspnea.   The various methods of treatment have been discussed with the patient and family. After consideration of risks, benefits and other options for treatment, the patient has consented to  Procedure(s): LEFT AND RIGHT HEART CATHETERIZATION WITH CORONARY ANGIOGRAM (N/A) +/- PCI as a surgical intervention .  The patient's history has been reviewed, patient examined, no change in status, stable for surgery.  I have reviewed the patient's chart and labs.  Questions were answered to the patient's satisfaction.    Cath Lab Visit (complete for each Cath Lab visit)  Clinical Evaluation Leading to the Procedure:   ACS: No.  Non-ACS:    Anginal Classification: CCS III  Anti-ischemic medical therapy: Minimal Therapy (1 class of medications)  Non-Invasive Test Results: Intermediate-risk stress test findings: cardiac mortality 1-3%/year  Prior CABG: No previous CABG       Melissa Paul

## 2014-04-12 NOTE — CV Procedure (Signed)
RIGHT AND LEFT CARDIAC CATHETERIZATION REPORT  NAME:  Melissa Paul   MRN: [id number] DOB:  20-May-1945   ADMIT DATE: 04/12/2014 Procedure Date: 04/12/2014  INTERVENTIONAL CARDIOLOGIST: Leonie Man, M.D., MS PRIMARY CARE PROVIDER: Deloria Lair, MD PRIMARY CARDIOLOGIST: Rozann Lesches, MD; Dr. Thompson Grayer  PATIENT:  Melissa Paul is a 69 y.o. female with a history of nonischemic cardiomyopathy status post ICD for ventricular tachycardia - is followed by both Dr. [name] and Dr. Domenic Polite.  She was seen by Dr. Domenic Polite for follow-up of having a Cardiolite Stress Test demonstrating small, mild intensity partially reversible defects and inferoseptal wall as well as a small, mild intensity reversible defect in the anterior apical and lateral segment. This is considered to be an intermediate risk test with ejection fraction of 41%. Clinically the patient has been having symptoms in NYHA class 2-3 dyspnea as well as intermittent chest tightness. She is referred for right left heart catheterization for evaluation of abnormal stress test, class 2-3 dyspnea and known cardiomyopathy with ventricular tachycardia.   PRE-OPERATIVE DIAGNOSIS:    Class III Dyspnea  Abnormal Nuclear Stress Test  PROCEDURES PERFORMED:    Right & Left Heart Catheterization with Native Coronary Angiography  via Right Radia Artery & Left Brachial Vein Access.  Left Ventriculography  PROCEDURE: The patient was brought to the [facility]nd [facility] in the fasting state and prepped and draped in the usual sterile fashion for Right Radial arterial & Left Brachial Venous access. A modified Allen's test was performed on the right wrist demonstrating excellent collateral flow. Sterile technique was used including antiseptics, cap, gloves, gown, hand hygiene, mask and sheet. Skin prep: Chlorhexidine.   Consent: Risks of procedure as well as the alternatives and risks of each were explained to the  (patient/caregiver). Consent for procedure obtained.   Time Out: Verified patient identification, verified procedure, site/side was marked, verified correct patient position, special equipment/implants available, medications/allergies/relevent history reviewed, required imaging and test results available. Performed.  Access:  Right Radial Artery: 6 Fr sheath -- Seldinger technique using Angiocath Micropuncture Kit 10 mL radial cocktail IA; 4500 Units IV Heparin Left Brachial/Antecubital Vein: The existing 18-gauge IV was exchanged over a wire for a 5Fr short sheath  Right Heart Catheterization: 5 Fr Gordy Councilman catheter advanced under fluoroscopy with balloon inflated to the RA, RV, then PCWP-PA for hemodynamic measurement.  Simultaneous FA & PA blood gases checked for SaO2% to calculate FICK CO/CI  Thermodilution Injections performed to calculate CO/CI  Simultaneous PCWP/LV & RV/LV pressures monitored with Angled Pigtail in LV.  Catheter removed completely out of the body with balloon deflated.  Left Heart Catheterization: 5Fr Catheters advanced or exchanged over a J-wire under direct fluoroscopic guidance; TIG 4.0 catheter advanced first.  Left & Right Coronary Artery Cineangiography: TIG 4.0 Catheter  LV Hemodynamics (LV Gram): Angled Pigtail Catheter  Sheath removed in the holding area with manual pressure for hemostasis.  TR band:  0830 hours; 14 mL   EBL: < 5 ml, not including ABG and VBG samples   FINDINGS:  Hemodynamics:  Findings:   SaO2%  Pressures mmHg  Mean P  mmHg  EDP  mmHg   Right Atrium    7/5    4  Right Ventricle    30/1    5   Pulmonary Artery   78   22/8   16    PCWP    16/18   11    Central Aortic  98   115/50   75    Left Ventricle    112/5    10          Cardiac Output:   Cardiac Index:    Fick   6.49    3.4     Left Ventriculography:  EF: ~40-45 %  Wall Motion: global hypokinesis  Coronary Anatomy:  Left Main: Normal caliber vessel, bifurcates  into the LAD and Circumflex. Angiographically normal. LAD: Large-caliber vessel that wraps down around the apex. It gives rise to a single bifurcating diagonal branch one smaller branch. This is also the takeoff of a major septal perforator trunk. The LAD system is relatively free of disease including the diagonal.  Tandem D1: Large-caliber branch and small caliber branch, both tortuous and free of disease.  Left Circumflex: Large-caliber, nondominant vessel that gives off a small AV groove branch before bifurcating in the AV groove into OM1 and OM 2. Small AV groove branch gives rise to a small left posterolateral branch. Angiographically normal.  OM1: Moderate caliber vessel that bifurcates in the distal portion due to small caliber branches. Tortuous, but free of disease.  OM 2: Moderate caliber vessel that tapers into a small-caliber vessel distally into a small caliber tortuous vessel.   RCA: Large-caliber, dominant vessel that terminates as a posterior descending artery with a small posterolateral system. Angiographically normal.     RPDA: Moderate caliber vessel that reaches almost to the apex. Angiographically normal.  RPL Sysytem:The RPAV begins as a moderate caliber vessel giving rise to several small posterolateral branches. Angiographically normal.  MEDICATIONS:  Anesthesia:  Local Lidocaine 2 ml each for the left antecubital and right radial  Sedation:  2 mg IV Versed, 50 mcg IV fentanyl ;   Premedication: 4 IVP contrast allergy she was given 3 doses of prednisone at home, with 20 mg IV Pepcid and 12 mg IV Benadryl in the Short Stay Center  Omnipaque Contrast: 80 ml  Anticoagulation:  IV Heparin 4500 Units Radial Cocktail: 5 mg Verapamil, 400 mcg NTG, 2 ml 2% Lidocaine in 10 ml NS Normal Saline 250 mL bolus for mild hypotension following radial cocktail  PATIENT DISPOSITION:    The patient was transferred to the PACU holding area in a hemodynamicaly stable, chest pain  free condition.  The patient was stable before, during, and after the procedure.  POST-OPERATIVE DIAGNOSIS:    Angiographically Normal Coronary Arteries with moderate to severely reduced EF of roughly 40%, similar to estimated EF by Myoview.  Likely false positive Myoview for ischemic coronary disease.  Patient did have frequent ectopy during the catheterization.  Relatively normal Right Heart Catheterization pressures with wedge pressure of ~16 & LVEDP of 10. No signs of pulmonary hypertension. There is a large dicrotic notch in the pulmonary arterial pressure waveform that would potentially suggest valvular disease, however on there was no evidence of MR on LV gram  PLAN OF CARE:  Standard post radial and brachial cath care.  Disparate discharge after bedrest  One additional dose of prednisone for contrast allergy  The patient will follow-up with Dr. Terese Door, Kendyl Bissonnette Viona Gilmore, M.D., M.S. [facility] [address] [place], [place]  [address]  [phone]  04/12/2014 8:35 AM

## 2014-04-12 NOTE — Discharge Instructions (Signed)
Radial Site Care °Refer to this sheet in the next few weeks. These instructions provide you with information on caring for yourself after your procedure. Your caregiver may also give you more specific instructions. Your treatment has been planned according to current medical practices, but problems sometimes occur. Call your caregiver if you have any problems or questions after your procedure. °HOME CARE INSTRUCTIONS °· You may shower the day after the procedure. Remove the bandage (dressing) and gently wash the site with plain soap and water. Gently pat the site dry. °· Do not apply powder or lotion to the site. °· Do not submerge the affected site in water for 3 to 5 days. °· Inspect the site at least twice daily. °· Do not flex or bend the affected arm for 24 hours. °· No lifting over 5 pounds (2.3 kg) for 5 days after your procedure. °· Do not drive home if you are discharged the same day of the procedure. Have someone else drive you. °· You may drive 24 hours after the procedure unless otherwise instructed by your caregiver. °· Do not operate machinery or power tools for 24 hours. °· A responsible adult should be with you for the first 24 hours after you arrive home. °What to expect: °· Any bruising will usually fade within 1 to 2 weeks. °· Blood that collects in the tissue (hematoma) may be painful to the touch. It should usually decrease in size and tenderness within 1 to 2 weeks. °SEEK IMMEDIATE MEDICAL CARE IF: °· You have unusual pain at the radial site. °· You have redness, warmth, swelling, or pain at the radial site. °· You have drainage (other than a small amount of blood on the dressing). °· You have chills. °· You have a fever or persistent symptoms for more than 72 hours. °· You have a fever and your symptoms suddenly get worse. °· Your arm becomes pale, cool, tingly, or numb. °· You have heavy bleeding from the site. Hold pressure on the site. °Document Released: 02/09/2010 Document Revised:  04/01/2011 Document Reviewed: 02/09/2010 °ExitCare® Patient Information ©2015 [employer]. This information is not intended to replace advice given to you by your health care provider. Make sure you discuss any questions you have with your health care provider. ° °

## 2014-04-12 NOTE — Interval H&P Note (Signed)
APPROPRIATENESS OF USE: Cardiomyopathies (Right and Left Heart Catheterization OR  Right Heart Catheterization Alone With/Without Left Ventriculography and Coronary Angiography)   Patient Information:    Re-evaluation of known cardiomyopathy   Change in clinical status or cardiac exam or to guide therapy  AUC Score:   A (7)   Indication:   94   For PCI.  Ischemic Symptoms? CCS III (Marked limitation of ordinary activity) Anti-ischemic Medical Therapy? Minimal Therapy (1 class of medications) Non-invasive Test Results? Intermediate-risk stress test findings: cardiac mortality 1-3%/year Prior CABG? No Previous CABG   Patient Information:   1-2V CAD, no prox LAD  U (6)  Indication: 16; Score: 6   Patient Information:   CTO of 1 vessel, no other CAD  U (6)  Indication: 26; Score: 6   Patient Information:   1V CAD with prox LAD  A (7)  Indication: 32; Score: 7   Patient Information:   2V-CAD with prox LAD  A (8)  Indication: 38; Score: 8   Patient Information:   3V-CAD without LMCA  A (8)  Indication: 44; Score: 8   Patient Information:   3V-CAD without LMCA With Abnormal LV systolic function  A (9)  Indication: 48; Score: 9   Patient Information:   LMCA-CAD  A (9)  Indication: 49; Score: 9   Patient Information:   2V-CAD with prox LAD PCI  A (7)  Indication: 62; Score: 7   Patient Information:   2V-CAD with prox LAD CABG  A (8)  Indication: 62; Score: 8   Patient Information:   3V-CAD without LMCA With Low CAD burden(i.e., 3 focal stenoses, low SYNTAX score) PCI  A (7)  Indication: 63; Score: 7   Patient Information:   3V-CAD without LMCA With Low CAD burden(i.e., 3 focal stenoses, low SYNTAX score) CABG  A (9)  Indication: 63; Score: 9   Patient Information:   3V-CAD without LMCA E06c - Intermediate-high CAD burden (i.e., multiple diffuse lesions, presence of CTO, or high SYNTAX score) PCI  U (4)  Indication:  64; Score: 4   Patient Information:   3V-CAD without LMCA E06c - Intermediate-high CAD burden (i.e., multiple diffuse lesions, presence of CTO, or high SYNTAX score) CABG  A (9)  Indication: 64; Score: 9   Patient Information:   LMCA-CAD With Isolated LMCA stenosis  PCI  U (6)  Indication: 65; Score: 6   Patient Information:   LMCA-CAD Additional CAD, low CAD burden (i.e., 1- to 2-vessel additional involvement, low SYNTAX score) PCI  U (5)  Indication: 66; Score: 5   Patient Information:   LMCA-CAD Additional CAD, low CAD burden (i.e., 1- to 2-vessel additional involvement, low SYNTAX score) CABG  A (9)  Indication: 66; Score: 9   Patient Information:   LMCA-CAD With Isolated LMCA stenosis  CABG  A (9)  Indication: 66; Score: 9   Patient Information:   LMCA-CAD Additional CAD, intermediate-high CAD burden (i.e., 3-vessel involvement, presence of CTO, or high SYNTAX score) PCI  I (3)  Indication: 67; Score: 3   Patient Information:   LMCA-CAD Additional CAD, intermediate-high CAD burden (i.e., 3-vessel involvement, presence of CTO, or high SYNTAX score) CABG  A (9)  Indication: 67; Score: 9   Melissa Paul W

## 2014-04-12 NOTE — H&P (View-Only) (Signed)
Cardiology Office Note  Date: 04/07/2014   ID: Melissa Paul, DOB 1945/08/09, MRN [id number]  PCP: Deloria Lair, MD  Primary Cardiologist: Rozann Lesches, MD   Chief Complaint  Patient presents with  . Cardiomyopathy  . Atrial Fibrillation    History of Present Illness: Melissa Paul is a 69 y.o. female last seen by me in August 2015. Interval visit with Dr. [name] noted in 04-01-2022. Note indicates that he was concerned about possible ischemic symptoms including chest pain and shortness of breath as well as ICD discharges for VT, and referred her for a Cardiolite study. Full results are outlined below, LVEF was 41% with global hypokinesis, and there were potential areas of mild ischemia noted in the basal inferoseptal and mid to apical anterolateral walls (although in the setting of ventricular pacing, left bundle branch block, and prior documented nonischemic cardiomyopathy - cardiac catheterization in 2009 revealed normal coronary arteries).  She is here with her daughter today. We discussed the results of the stress test ordered by Dr. [name]. We also reviewed her symptoms. Her husband recently passed away in 2022/04/01 after a protracted illness, he was in Hospice. Her episodes of VT were around this time. Unrelated to this event however, she has been experiencing intermittent chest tightness, not predictable or necessarily with exertion. She has occasional palpitations. She reports NYHA class 2-3 dyspnea which is not new.  We discussed the possibility of proceeding with a left and right heart catheterization to clearly reassess her coronary anatomy and also evaluate hemodynamics. She is on a good medical regimen for cardiomyopathy, blood pressure normal today, and with stable weight. After discussing the anticipated benefits and potential risks, she is in agreement to proceed.   Past Medical History  Diagnosis Date  . Nonischemic cardiomyopathy     LVEF 35-40% by echo 8/13    . Chronic systolic heart failure   . LBBB (left bundle branch block)   . Hypothyroidism   . UTI (urinary tract infection)     Recurrent  . Glucose intolerance (pre-diabetes)   . Hyperlipemia   . Mitral regurgitation     Mild to Moderate  . Paroxysmal atrial fibrillation     Brief episodes by device interrogation  . Ventricular tachycardia     ICD therapy for VT    Past Surgical History  Procedure Laterality Date  . Cholecystectomy    . Total abdominal hysterectomy      (R) ovary removed  . Appendectomy    . Knee arthroscopy      Left  . Cardiac defibrillator placement  09/2009    St. Jude BiV ICD by Dr. Sandria Manly class III    Current Outpatient Prescriptions  Medication Sig Dispense Refill  . aspirin 81 MG tablet Take 1 tablet by mouth daily.    . carvedilol (COREG) 12.5 MG tablet Take 1 tablet (12.5 mg total) by mouth 2 (two) times daily. 180 tablet 3  . cetirizine (ZYRTEC) 10 MG tablet Take 10 mg by mouth daily as needed for allergies or rhinitis.     Marland Kitchen diazepam (VALIUM) 2 MG tablet Take 2 mg by mouth 3 (three) times daily.     . digoxin (LANOXIN) 0.125 MG tablet Take 1 tablet by mouth daily.     . furosemide (LASIX) 40 MG tablet Take 40 mg by mouth daily.    Marland Kitchen levothyroxine (SYNTHROID, LEVOTHROID) 75 MCG tablet Take 1/2 tablet by mouth daily.      Marland Kitchen losartan (COZAAR) 25 MG  tablet Take 1/2 tab (12.5mg ) daily (Patient taking differently: Take 1/2 tab (12.5mg ) by mouth daily) 15 tablet 6  . ondansetron (ZOFRAN) 4 MG tablet Take 4 mg by mouth every 8 (eight) hours as needed for nausea or vomiting.    . ranitidine (ZANTAC) 150 MG capsule Take 150 mg by mouth daily.     Marland Kitchen spironolactone (ALDACTONE) 25 MG tablet Take 1 tablet by mouth daily.      No current facility-administered medications for this visit.    Allergies:  Bactrim; Cephalexin; Clindamycin; Contrast media; Latex; Levofloxacin; Nitrofurantoin monohyd macro; and Penicillins   Social History: The patient   reports that she quit smoking about 18 years ago. Her smoking use included Cigarettes. She started smoking about 64 years ago. She has a 6 pack-year smoking history. She has never used smokeless tobacco. She reports that she does not drink alcohol or use illicit drugs.   Family History: The patient's family history is negative for Diabetes, Hypertension, and Coronary artery disease.   ROS:  Please see the history of present illness. Otherwise, complete review of systems is positive for occasional palpitations, no frank syncope. Chronic leg edema..  All other systems are reviewed and negative.    Physical Exam: VS:  BP 100/62 mmHg  Pulse 69  Ht 5\' 4"  (1.626 m)  Wt 189 lb (85.73 kg)  BMI 32.43 kg/m2  SpO2 99%, BMI Body mass index is 32.43 kg/(m^2).  Wt Readings from Last 3 Encounters:  04/07/14 189 lb (85.73 kg)  03/16/14 187 lb 9.6 oz (85.095 kg)  11/12/13 191 lb (86.637 kg)      Overweight woman, appears comfortable at rest.  HEENT: Conjunctiva and lids normal, oropharynx clear with poor dentition. Neck: Supple, no elevated JVP, no carotid bruits, no thyromegaly.  Lungs: Clear to auscultation with decreased breath sounds, nonlabored breathing at rest.  Cardiac: Regular rate and rhythm, no S3 or significant systolic murmur, no pericardial rub.  Abdomen: Soft, nontender, bowel sounds present, no guarding or rebound.  Extremities: 1+ edema and adipose tissue, stasis. Skin: Warm and dry. Musculoskeletal: No kyphosis. Neuropsychiatric: Alert and oriented 3, affect appropriate.    ECG: Tracing from 03/16/2014 showed normal sinus rhythm with ventricular pacing.  Recent Labwork: 03/16/2014: BUN 20; Creatinine 0.95; Magnesium 2.2; Potassium 4.3; Sodium 137; TSH 3.22   Other Studies Reviewed Today:  1. Echocardiogram 03/24/2014: Study Conclusions  - Left ventricle: Systolic function was moderately reduced. Estimated LVEF 40%. Global hypokinesis was seen. The cavity size was  normal. Wall thickness was increased in a pattern of mild LVH. Features are consistent with a pseudonormal left ventricular filling pattern, with concomitant abnormal relaxation and increased filling pressure (grade 2 diastolic dysfunction). Doppler parameters are consistent with high ventricular filling pressure. - Left atrium: The atrium was mildly dilated. Volume/bsa, ES, (1-plane Simpson&'s, A2C): 30.3 ml/m^2. - Right ventricle: Pacer wire or catheter noted in right ventricle. Systolic function was mildly reduced. - Right atrium: The atrium was mildly dilated. Pacer wire or catheter noted in right atrium. - Tricuspid valve: There was mild regurgitation.  2. Lexiscan Cardiolite 03/31/2014: MYOCARDIAL IMAGING WITH SPECT (REST AND PHARMACOLOGIC-STRESS)  GATED LEFT VENTRICULAR WALL MOTION STUDY  LEFT VENTRICULAR EJECTION FRACTION  TECHNIQUE: Standard myocardial SPECT imaging was performed after resting intravenous injection of 10 mCi Tc-50m sestamibi. Subsequently, intravenous infusion of Lexiscan was performed under the supervision of the Cardiology staff. At peak effect of the drug, 30 mCi Tc-55m sestamibi was injected intravenously and standard myocardial SPECT imaging was performed.  Quantitative gated imaging was also performed to evaluate left ventricular wall motion, and estimate left ventricular ejection fraction.  FINDINGS: Baseline tracing shows a dual-chamber paced rhythm. Lexiscan bolus was given in standard fashion. Heart rate increased from 63 beats per min up to 75 beats per min, and blood pressure increased from 117/80 up to 128/61. No chest pain was reported. Ventricular pacing was present throughout the study with occasional PVCs. There were no sustained arrhythmias.  Analysis of the raw perfusion data shows heterogeneous radiotracer uptake within the myocardium as well as breast attenuation artifact.  Perfusion: There is a small, mild  intensity, partially reversible basal inferoseptal defect as well as a small, mild intensity, partially reversible mid to apical anterolateral defect.  Wall Motion: Global left ventricular hypokinesis.  Left Ventricular Ejection Fraction: 41 %  End diastolic volume 277 ml  End systolic volume 85 ml  IMPRESSION: 1. Abnormal myocardial perfusion imaging with areas of potential mild ischemia in the basal inferoseptal as well as mid apical anterolateral walls. Patient's history includes nonischemic cardiomyopathy, left bundle branch block, and ventricular pacing which could lead to perfusion abnormalities as well. This study does not however exclude underlying CAD.  2. There is global left ventricular hypokinesis.  3. Left ventricular ejection fraction 41%  4. Intermediate-risk stress test findings*.   Assessment and Plan:  1. Abnormal Cardiolite as outlined above, potential mild ischemia noted in the basal inferoseptal as well as mid to apical anterolateral walls, although in the presence of ventricular pacing, history of left bundle-branch block, and previously documented nonischemic cardiomyopathy. She has had intermittent chest pain symptoms and shortness of breath as well as VT requiring ICD shocks. After above discussion, plan is to arrange an outpatient left and right heart catheterization for reassessment of the coronary anatomy and hemodynamics. She is in agreement to proceed.  2. Nonischemic cardiomyopathy, LVEF approximately 40%, documentation of normal coronary arteries at cardiac catheterization 2009.  3. St. Jude biventricular ICD in place, followed by Dr. [name].  4. Mitral regurgitation Described as trivial on recent echocardiogram. Previously mild to moderate.  Current medicines are reviewed at length with the patient today.  The patient does not have concerns regarding medicines.   Disposition: FU with me in 2-3 weeks.   Signed, Satira Sark,  MD, [facility] 04/07/2014 8:52 AM    [facility] [place], [place], [place] [address] Phone: [phone]; Fax: [phone]

## 2014-04-14 ENCOUNTER — Telehealth: Payer: Self-pay | Admitting: *Deleted

## 2014-04-14 DIAGNOSIS — R52 Pain, unspecified: Secondary | ICD-10-CM

## 2014-04-14 DIAGNOSIS — R609 Edema, unspecified: Secondary | ICD-10-CM

## 2014-04-14 LAB — POCT I-STAT 3, VENOUS BLOOD GAS (G3P V)
Bicarbonate: 24.6 mEq/L — ABNORMAL HIGH (ref 20.0–24.0)
Bicarbonate: 24.7 mEq/L — ABNORMAL HIGH (ref 20.0–24.0)
O2 Saturation: 78 %
O2 Saturation: 80 %
TCO2: 26 mmol/L (ref 0–100)
TCO2: 26 mmol/L (ref 0–100)
pCO2, Ven: 37.7 mmHg — ABNORMAL LOW (ref 45.0–50.0)
pCO2, Ven: 38.4 mmHg — ABNORMAL LOW (ref 45.0–50.0)
pH, Ven: 7.415 — ABNORMAL HIGH (ref 7.250–7.300)
pH, Ven: 7.424 — ABNORMAL HIGH (ref 7.250–7.300)
pO2, Ven: 42 mmHg (ref 30.0–45.0)
pO2, Ven: 43 mmHg (ref 30.0–45.0)

## 2014-04-14 LAB — POCT I-STAT 3, ART BLOOD GAS (G3+)
Bicarbonate: 24.2 mEq/L — ABNORMAL HIGH (ref 20.0–24.0)
O2 Saturation: 98 %
TCO2: 25 mmol/L (ref 0–100)
pCO2 arterial: 36.6 mmHg (ref 35.0–45.0)
pH, Arterial: 7.429 (ref 7.350–7.450)
pO2, Arterial: 102 mmHg — ABNORMAL HIGH (ref 80.0–100.0)

## 2014-04-14 NOTE — Telephone Encounter (Signed)
Patient walked into office wanting to have cath site checked.  Stated that it does not feel right.   Area at inner elbow did have knot felt under the skin.  Patient stated has gotten bigger over last few days & stings.  Will have Dr. Harl Bowie look at area.  Per Dr. Harl Bowie -   -  Right upper extremity arterial duplex - scheduled for Friday, 04/15/2014 at 9:00 at [facility]   -  Warm compress -  Tylenol as needed for pain.  Patient aware of above & verbalized understanding.

## 2014-04-15 ENCOUNTER — Telehealth: Payer: Self-pay | Admitting: *Deleted

## 2014-04-15 ENCOUNTER — Ambulatory Visit (HOSPITAL_COMMUNITY)
Admission: RE | Admit: 2014-04-15 | Discharge: 2014-04-15 | Disposition: A | Discharge status: Home / Self Care | Payer: Medicare Other | Source: Ambulatory Visit | Attending: Cardiology | Admitting: Cardiology

## 2014-04-15 DIAGNOSIS — R609 Edema, unspecified: Secondary | ICD-10-CM

## 2014-04-15 DIAGNOSIS — R52 Pain, unspecified: Secondary | ICD-10-CM | POA: Diagnosis present

## 2014-04-15 NOTE — Telephone Encounter (Signed)
-----   Message from Arnoldo Lenis, MD sent at 04/15/2014 11:21 AM EDT ----- Normal ultrasound of right arm, the artery and vein do not have any damage. Discomfort appears to be just related to local swelling. Continue warm compresses and prn tylenol. Will cc Dr Domenic Polite the results   Zandra Abts MD

## 2014-04-15 NOTE — Telephone Encounter (Signed)
Pt made aware, forwarded to Dr. Scotty Court

## 2014-04-25 ENCOUNTER — Telehealth: Payer: Self-pay | Admitting: *Deleted

## 2014-04-25 MED ORDER — LOSARTAN POTASSIUM 25 MG PO TABS: ORAL_TABLET | ORAL | Status: DC

## 2014-04-25 NOTE — Telephone Encounter (Signed)
Refill request from [facility] for losartan 12.5 mg daily. Medication sent to pharmacy.

## 2014-04-26 ENCOUNTER — Encounter: Payer: Self-pay | Admitting: Cardiology

## 2014-04-26 ENCOUNTER — Ambulatory Visit (INDEPENDENT_AMBULATORY_CARE_PROVIDER_SITE_OTHER): Payer: Medicare Other | Admitting: Cardiology

## 2014-04-26 VITALS — BP 98/56 | HR 61 | Ht 64.0 in | Wt 189.4 lb

## 2014-04-26 DIAGNOSIS — I5022 Chronic systolic (congestive) heart failure: Secondary | ICD-10-CM | POA: Diagnosis not present

## 2014-04-26 DIAGNOSIS — I080 Rheumatic disorders of both mitral and aortic valves: Secondary | ICD-10-CM | POA: Diagnosis not present

## 2014-04-26 DIAGNOSIS — I429 Cardiomyopathy, unspecified: Secondary | ICD-10-CM

## 2014-04-26 DIAGNOSIS — I428 Other cardiomyopathies: Secondary | ICD-10-CM

## 2014-04-26 DIAGNOSIS — Z95810 Presence of automatic (implantable) cardiac defibrillator: Secondary | ICD-10-CM | POA: Diagnosis not present

## 2014-04-26 NOTE — Progress Notes (Signed)
Cardiology Office Note  Date: 04/26/2014   ID: Melissa Paul, DOB 02/09/1945, MRN [id number]  PCP: Deloria Lair, MD  Primary Cardiologist: Rozann Lesches, MD   Chief Complaint  Patient presents with  . Cardiomyopathy  . Follow-up heart catheterization    History of Present Illness: Melissa Paul is a 69 y.o. female last seen in March. At that time we discussed proceeding with a heart catheterization to further clarify an abnormal Cardiolite study that was ordered by Dr. [name] to assess for potential ischemic symptoms and VT with device shocks. The study was performed by Dr. Ellyn Hack, and fortunately did not demonstrate any angiographically significant CAD, with relatively normal right heart pressures.   I reviewed the chart, she did present to the office to have her catheterization site checked noting some swelling in her right arm. Dr. Harl Bowie assessed the patient and ordered an upper extremity ultrasound study which demonstrated no evidence of pseudoaneurysm involving the right brachial, radial, or ulnar arteries.  She is here today for a follow-up visit, states that her right arm has essentially healed back to normal. We discussed the results of her cardiac catheterization and plan to continue medical therapy for now.   Past Medical History  Diagnosis Date  . Nonischemic cardiomyopathy     LVEF 35-40% by echo 8/13  . Chronic systolic heart failure   . LBBB (left bundle branch block)   . Hypothyroidism   . UTI (urinary tract infection)     Recurrent  . Glucose intolerance (pre-diabetes)   . Hyperlipemia   . Mitral regurgitation     Mild to Moderate  . Paroxysmal atrial fibrillation     Brief episodes by device interrogation  . Ventricular tachycardia     ICD therapy for VT    Past Surgical History  Procedure Laterality Date  . Cholecystectomy    . Total abdominal hysterectomy      (R) ovary removed  . Appendectomy    . Knee arthroscopy      Left  .  Cardiac defibrillator placement  09/2009    St. Jude BiV ICD by Dr. Sandria Manly class III  . Left and right heart catheterization with coronary angiogram N/A 04/12/2014    Procedure: LEFT AND RIGHT HEART CATHETERIZATION WITH CORONARY ANGIOGRAM;  Surgeon: Leonie Man, MD;  Location: [facility] CATH LAB;  Service: Cardiovascular;  Laterality: N/A;    Current Outpatient Prescriptions  Medication Sig Dispense Refill  . aspirin 81 MG tablet Take 81 mg by mouth daily. Take 1 tablet by mouth daily.    Marland Kitchen azithromycin (ZITHROMAX) 250 MG tablet Take 250 mg by mouth once. Z-pack - take as directed - starting today.    . carvedilol (COREG) 12.5 MG tablet Take 1 tablet (12.5 mg total) by mouth 2 (two) times daily. 180 tablet 3  . cetirizine (ZYRTEC) 10 MG tablet Take 10 mg by mouth daily as needed for allergies or rhinitis.     . Chlorpheniramine-DM (CORICIDIN HBP COUGH/COLD PO) Take 1 tablet by mouth as needed.    . diazepam (VALIUM) 5 MG tablet Take 5 mg by mouth every morning. Take one tablet every morning per patient    . digoxin (LANOXIN) 0.125 MG tablet Take 1 tablet by mouth daily.     . furosemide (LASIX) 40 MG tablet Take 40 mg by mouth daily.    Marland Kitchen levothyroxine (SYNTHROID, LEVOTHROID) 75 MCG tablet Take 37.5 mcg by mouth daily.     Marland Kitchen losartan (COZAAR) 25 MG  tablet Take 1/2 tab (12.5mg ) by mouth daily 15 tablet 6  . ondansetron (ZOFRAN) 4 MG tablet Take 4 mg by mouth every 8 (eight) hours as needed for nausea or vomiting.    . ranitidine (ZANTAC) 150 MG capsule Take 150 mg by mouth daily.     Marland Kitchen spironolactone (ALDACTONE) 25 MG tablet Take 25 mg by mouth daily.      No current facility-administered medications for this visit.    Allergies:  Bactrim; Cephalexin; Clindamycin; Contrast media; Latex; Levofloxacin; Nitrofurantoin monohyd macro; and Penicillins   Social History: The patient  reports that she quit smoking about 18 years ago. Her smoking use included Cigarettes. She started smoking about 64  years ago. She has a 6 pack-year smoking history. She has never used smokeless tobacco. She reports that she does not drink alcohol or use illicit drugs.    ROS:  Please see the history of present illness. Otherwise, complete review of systems is positive for urinary hesitancy and some dysuria. Has history of UTIs.  All other systems are reviewed and negative.   Physical Exam: VS:  BP 98/56 mmHg  Pulse 61  Ht 5\' 4"  (1.626 m)  Wt 189 lb 6.4 oz (85.911 kg)  BMI 32.49 kg/m2  SpO2 94%, BMI Body mass index is 32.49 kg/(m^2).  Wt Readings from Last 3 Encounters:  04/26/14 189 lb 6.4 oz (85.911 kg)  04/07/14 189 lb (85.73 kg)  03/16/14 187 lb 9.6 oz (85.095 kg)     Overweight woman, appears comfortable at rest.  HEENT: Conjunctiva and lids normal, oropharynx clear with poor dentition. Neck: Supple, no elevated JVP, no carotid bruits, no thyromegaly.  Lungs: Clear to auscultation with decreased breath sounds, nonlabored breathing at rest.  Cardiac: Regular rate and rhythm, no S3 or significant systolic murmur, no pericardial rub.  Abdomen: Soft, nontender, bowel sounds present, no guarding or rebound.  Extremities: 1+ edema and adipose tissue, stasis. Right upper extremity without swelling or hematoma, mild residual ecchymosis at right wrist. Skin: Warm and dry. Musculoskeletal: No kyphosis. Neuropsychiatric: Alert and oriented 3, affect appropriate.   ECG: ECG is not ordered today.   Recent Labwork: 03/16/2014: Magnesium 2.2; TSH 3.22 04/12/2014: BUN 18; Creatinine 0.97; Hemoglobin 14.4; Platelets 256; Potassium 4.1; Sodium 135   Other Studies Reviewed Today:  1. Echocardiogram 03/24/2014: Study Conclusions  - Left ventricle: Systolic function was moderately reduced. Estimated LVEF 40%. Global hypokinesis was seen. The cavity size was normal. Wall thickness was increased in a pattern of mild LVH. Features are consistent with a pseudonormal left ventricular filling  pattern, with concomitant abnormal relaxation and increased filling pressure (grade 2 diastolic dysfunction). Doppler parameters are consistent with high ventricular filling pressure. - Left atrium: The atrium was mildly dilated. Volume/bsa, ES, (1-plane Simpson&'s, A2C): 30.3 ml/m^2. - Right ventricle: Pacer wire or catheter noted in right ventricle. Systolic function was mildly reduced. - Right atrium: The atrium was mildly dilated. Pacer wire or catheter noted in right atrium. - Tricuspid valve: There was mild regurgitation.  2. Cardiac catheterization 04/12/2014:   FINDINGS:  Hemodynamics:  Findings:   SaO2%  Pressures mmHg  Mean P  mmHg  EDP  mmHg   Right Atrium   7/5   4  Right Ventricle   30/1   5   Pulmonary Artery  78  22/8  16    PCWP   16/18  11    Central Aortic  98  115/50  75    Left Ventricle  112/5   10          Cardiac Output:   Cardiac Index:    Fick  6.49   3.4     Left Ventriculography:  EF: ~40-45 %  Wall Motion: global hypokinesis  Coronary Anatomy:  Left Main: Normal caliber vessel, bifurcates into the LAD and Circumflex. Angiographically normal. LAD: Large-caliber vessel that wraps down around the apex. It gives rise to a single bifurcating diagonal branch one smaller branch. This is also the takeoff of a major septal perforator trunk. The LAD system is relatively free of disease including the diagonal.  Tandem D1: Large-caliber branch and small caliber branch, both tortuous and free of disease.  Left Circumflex: Large-caliber, nondominant vessel that gives off a small AV groove branch before bifurcating in the AV groove into OM1 and OM 2. Small AV groove branch gives rise to a small left posterolateral branch. Angiographically normal.  OM1: Moderate caliber vessel that bifurcates in the distal portion due to small caliber branches. Tortuous, but free of  disease.  OM 2: Moderate caliber vessel that tapers into a small-caliber vessel distally into a small caliber tortuous vessel.   RCA: Large-caliber, dominant vessel that terminates as a posterior descending artery with a small posterolateral system. Angiographically normal.   RPDA: Moderate caliber vessel that reaches almost to the apex. Angiographically normal.  RPL Sysytem:The RPAV begins as a moderate caliber vessel giving rise to several small posterolateral branches. Angiographically normal.       Assessment and Plan:  1. Nonischemic cardiomyopathy, LVEF approximately 40%. Recent follow-up cardiac catheterization showed relatively normal right heart pressures and no angiographically significant CAD. Plan is to continue medical therapy.  2. Chronic systolic heart failure, weight is stable. Plan to continue Coreg, Lanoxin, Cozaar, Aldactone, and Lasix.  3. Mitral regurgitation, mild to moderate by prior assessment.  4. History of VT with St. Jude biventricular ICD in place, keep follow-up with Dr. [name].  Current medicines were reviewed with the patient today. No changes were made.   Disposition: FU with me in 4 months.   Signed, Satira Sark, MD, [facility] 04/26/2014 2:00 PM    [facility] [place], [place], [place] [address] Phone: [phone]; Fax: [phone]

## 2014-04-26 NOTE — Patient Instructions (Signed)
Your physician recommends that you schedule a follow-up appointment in: 4 months. You will receive a reminder letter in the mail in about 2 months reminding you to call and schedule your appointment. If you don't receive this letter, please contact our office. Your physician recommends that you continue on your current medications as directed. Please refer to the Current Medication list given to you today. 

## 2014-06-08 ENCOUNTER — Ambulatory Visit (INDEPENDENT_AMBULATORY_CARE_PROVIDER_SITE_OTHER): Payer: Medicare Other | Admitting: Internal Medicine

## 2014-06-08 ENCOUNTER — Encounter: Payer: Self-pay | Admitting: Internal Medicine

## 2014-06-08 VITALS — BP 102/64 | HR 60 | Ht 64.0 in | Wt 199.6 lb

## 2014-06-08 DIAGNOSIS — I5023 Acute on chronic systolic (congestive) heart failure: Secondary | ICD-10-CM | POA: Insufficient documentation

## 2014-06-08 DIAGNOSIS — I480 Paroxysmal atrial fibrillation: Secondary | ICD-10-CM | POA: Diagnosis not present

## 2014-06-08 DIAGNOSIS — I472 Ventricular tachycardia, unspecified: Secondary | ICD-10-CM

## 2014-06-08 DIAGNOSIS — I5022 Chronic systolic (congestive) heart failure: Secondary | ICD-10-CM

## 2014-06-08 DIAGNOSIS — I5043 Acute on chronic combined systolic (congestive) and diastolic (congestive) heart failure: Secondary | ICD-10-CM | POA: Diagnosis not present

## 2014-06-08 DIAGNOSIS — I428 Other cardiomyopathies: Secondary | ICD-10-CM

## 2014-06-08 DIAGNOSIS — I429 Cardiomyopathy, unspecified: Secondary | ICD-10-CM | POA: Diagnosis not present

## 2014-06-08 LAB — CUP PACEART INCLINIC DEVICE CHECK
Battery Remaining Longevity: 25.2 mo
Brady Statistic RA Percent Paced: 82 %
Brady Statistic RV Percent Paced: 92 %
Date Time Interrogation Session: 20160518120008
HighPow Impedance: 75.375
Lead Channel Impedance Value: 400 Ohm
Lead Channel Impedance Value: 475 Ohm
Lead Channel Impedance Value: 525 Ohm
Lead Channel Pacing Threshold Amplitude: 0.5 V
Lead Channel Pacing Threshold Amplitude: 0.5 V
Lead Channel Pacing Threshold Amplitude: 0.625 V
Lead Channel Pacing Threshold Pulse Width: 0.5 ms
Lead Channel Pacing Threshold Pulse Width: 0.5 ms
Lead Channel Pacing Threshold Pulse Width: 0.5 ms
Lead Channel Sensing Intrinsic Amplitude: 11.8 mV
Lead Channel Sensing Intrinsic Amplitude: 5 mV
Lead Channel Setting Pacing Amplitude: 2 V
Lead Channel Setting Pacing Amplitude: 2 V
Lead Channel Setting Pacing Amplitude: 2.5 V
Lead Channel Setting Pacing Pulse Width: 0.5 ms
Lead Channel Setting Pacing Pulse Width: 0.5 ms
Lead Channel Setting Sensing Sensitivity: 0.5 mV
Pulse Gen Serial Number: 794739
Zone Setting Detection Interval: 270 ms
Zone Setting Detection Interval: 340 ms

## 2014-06-08 LAB — CBC
HCT: 38.5 % (ref 36.0–46.0)
Hemoglobin: 13.2 g/dL (ref 12.0–15.0)
MCHC: 34.2 g/dL (ref 30.0–36.0)
MCV: 91.7 fl (ref 78.0–100.0)
Platelets: 286 10*3/uL (ref 150.0–400.0)
RBC: 4.2 Mil/uL (ref 3.87–5.11)
RDW: 15 % (ref 11.5–15.5)
WBC: 10.7 10*3/uL — ABNORMAL HIGH (ref 4.0–10.5)

## 2014-06-08 LAB — BASIC METABOLIC PANEL
BUN: 14 mg/dL (ref 6–23)
CO2: 30 mEq/L (ref 19–32)
Calcium: 9.2 mg/dL (ref 8.4–10.5)
Chloride: 101 mEq/L (ref 96–112)
Creatinine, Ser: 0.78 mg/dL (ref 0.40–1.20)
GFR: 77.41 mL/min (ref >60.00)
Glucose, Bld: 193 mg/dL — ABNORMAL HIGH (ref 70–99)
Potassium: 4.2 mEq/L (ref 3.5–5.1)
Sodium: 136 mEq/L (ref 135–145)

## 2014-06-08 MED ORDER — APIXABAN 5 MG PO TABS: 5.0000 mg | ORAL_TABLET | Freq: Two times a day (BID) | ORAL | Status: DC

## 2014-06-08 NOTE — Progress Notes (Signed)
Electrophysiology Office Note   Date:  06/08/2014   ID:  Melissa Paul, DOB 1945/05/27, MRN [id number]  PCP:  Deloria Lair, MD  Cardiologist:  Dr Domenic Polite Primary Electrophysiologist: Thompson Grayer, MD    Chief Complaint  Patient presents with  . Atrial Fibrillation     History of Present Illness: Melissa Paul is a 69 y.o. female who presents today for electrophysiology evaluation.   She is doing reasonably well.  She has had no further ventricular arrhythmias.  She has had afib for which she is unaware.  She denies recent bleeding but has had prior GI bleeding.  He primary concern is with weight gain/ fluid retention.  She has swelling in her extremities.  She is concerned with urinary incontinence.  Today, she denies symptoms of palpitations, chest pain, shortness of breath, orthopnea, PND, claudication, dizziness, presyncope, syncope, bleeding, or neurologic sequela. The patient is tolerating medications without difficulties and is otherwise without complaint today.    Past Medical History  Diagnosis Date  . Nonischemic cardiomyopathy     LVEF 35-40% by echo 8/13  . Chronic systolic heart failure   . LBBB (left bundle branch block)   . Hypothyroidism   . UTI (urinary tract infection)     Recurrent  . Glucose intolerance (pre-diabetes)   . Hyperlipemia   . Mitral regurgitation     Mild to Moderate  . Paroxysmal atrial fibrillation     Brief episodes by device interrogation  . Ventricular tachycardia     ICD therapy for VT   Past Surgical History  Procedure Laterality Date  . Cholecystectomy    . Total abdominal hysterectomy      (R) ovary removed  . Appendectomy    . Knee arthroscopy      Left  . Cardiac defibrillator placement  09/2009    St. Jude BiV ICD by Dr. Sandria Manly class III  . Left and right heart catheterization with coronary angiogram N/A 04/12/2014    Procedure: LEFT AND RIGHT HEART CATHETERIZATION WITH CORONARY ANGIOGRAM;  Surgeon: Leonie Man, MD;  Location: [facility] CATH LAB;  Service: Cardiovascular;  Laterality: N/A;     Current Outpatient Prescriptions  Medication Sig Dispense Refill  . aspirin 81 MG tablet Take 1 tablet by mouth daily.    . carvedilol (COREG) 12.5 MG tablet Take 1 tablet (12.5 mg total) by mouth 2 (two) times daily. 180 tablet 3  . cetirizine (ZYRTEC) 10 MG tablet Take 10 mg by mouth daily as needed for allergies or rhinitis.     Marland Kitchen diazepam (VALIUM) 5 MG tablet Take one tablet every morning per patient    . digoxin (LANOXIN) 0.125 MG tablet Take 1 tablet by mouth daily.     . furosemide (LASIX) 40 MG tablet Take 40 mg by mouth daily.    Marland Kitchen levothyroxine (SYNTHROID, LEVOTHROID) 75 MCG tablet Take 37.5 mcg by mouth daily.     Marland Kitchen losartan (COZAAR) 25 MG tablet Take 1/2 tab (12.5mg ) by mouth daily 15 tablet 6  . ondansetron (ZOFRAN) 4 MG tablet Take 4 mg by mouth every 8 (eight) hours as needed for nausea or vomiting.    . ranitidine (ZANTAC) 150 MG capsule Take 150 mg by mouth daily.     Marland Kitchen spironolactone (ALDACTONE) 25 MG tablet Take 25 mg by mouth daily.      No current facility-administered medications for this visit.    Allergies:   Bactrim; Cephalexin; Clindamycin; Contrast media; Latex; Levofloxacin; Nitrofurantoin monohyd macro;  and Penicillins   Social History:  The patient  reports that she quit smoking about 18 years ago. Her smoking use included Cigarettes. She started smoking about 64 years ago. She has a 6 pack-year smoking history. She has never used smokeless tobacco. She reports that she does not drink alcohol or use illicit drugs.   Family History:  The patient's family history includes CVA in her mother; Heart Problems in her mother; Heart disease in her maternal grandfather and maternal grandmother. There is no history of Diabetes, Hypertension, or Coronary artery disease.    ROS:  Please see the history of present illness.   All other systems are reviewed and negative.    PHYSICAL  EXAM: VS:  BP 102/64 mmHg  Pulse 60  Ht 5\' 4"  (1.626 m)  Wt 199 lb 9.6 oz (90.538 kg)  BMI 34.24 kg/m2 , BMI Body mass index is 34.24 kg/(m^2). GEN: Well nourished, well developed, in no acute distress HEENT: normal Neck: mildly elevated JVP, carotid bruits, or masses Cardiac: RRR; no murmurs, rubs, or gallops,trace edema  Respiratory:  clear to auscultation bilaterally, normal work of breathing GI: soft, nontender, nondistended, + BS MS: no deformity or atrophy Skin: warm and dry, device pocket is well healed Neuro:  Strength and sensation are intact Psych: euthymic mood, full affect  Device interrogation is reviewed today in detail.  See PaceArt for details.   Recent Labs: 03/16/2014: Magnesium 2.2; TSH 3.22 04/12/2014: BUN 18; Creatinine 0.97; Hemoglobin 14.4; Platelets 256; Potassium 4.1; Sodium 135    Lipid Panel     Component Value Date/Time   CHOL  04/11/2007 0600    139        ATP III CLASSIFICATION:  <200     mg/dL   Desirable  200-239  mg/dL   Borderline High  >=240    mg/dL   High   TRIG 51 04/11/2007 0600   HDL 42 04/11/2007 0600   CHOLHDL 3.3 04/11/2007 0600   VLDL 10 04/11/2007 0600   LDLCALC  04/11/2007 0600    87        Total Cholesterol/HDL:CHD Risk Coronary Heart Disease Risk Table                     Men   Women  1/2 Average Risk   3.4   3.3     Wt Readings from Last 3 Encounters:  06/08/14 199 lb 9.6 oz (90.538 kg)  04/26/14 189 lb 6.4 oz (85.911 kg)  04/07/14 189 lb (85.73 kg)    Dr Chuck Hint notes are reviewed  ASSESSMENT AND PLAN:  1.  VF Controlled, without recurrence Can resume driving in mid August if no further arrhythmias (we discussed today) Normal ICD function See Pace Art report No changes today  2. afib Burden continues to increase  chads2vasc score is at least 3.  Will stop asa and start eliquis 5mg  BID today Check cbc and bmet Avoid AADs unless burden increases  3. Acute on chronic systolic dysfunction Weight is up  10 lbs She is mildly overloaded on exam coreview corresponds to her symptoms Will increase lasix from 80mg  daily to 80mg  BID x 2 days then return to 80mg  daily bmet today 2 gram sodium restriction advised Will enroll in ICM device clinic with Elenora Gamma Return to see me in [place] in 6 months OK to proceed with urogyn surgery if medically necessary from an EP standpoint  Current medicines are reviewed at length with the  patient today.   The patient does not have concerns regarding her medicines.  The following changes were made today:  none  Signed, Thompson Grayer, MD  06/08/2014 11:43 AM     [facility] [address] [place] [address] [phone] (office) [phone] (fax)

## 2014-06-08 NOTE — Patient Instructions (Signed)
Medication Instructions: 1) Stop aspirin 2) Start Eliquis 5 mg one tablet by mouth twice daily 3) Increase lasix (furosemide) to 80 mg twice daily x 2 days, then resume 80 mg once daily  Labwork: - Your physician recommends that you have lab work today: BMP/ CBC  Procedures/Testing: - none  Follow-Up: - A remote transmission has been scheduled for Thursday 06/23/14 to look at the fluid readings on your device.Melissa Lemmings, RN will call you to check in once this is received.  - Remote monitoring is used to monitor your Pacemaker of ICD from home. This monitoring reduces the number of office visits required to check your device to one time per year. It allows [place] to keep an eye on the functioning of your device to ensure it is working properly. You are scheduled for a device check from home on 09/07/14. You may send your transmission at any time that day. If you have a wireless device, the transmission will be sent automatically. After your physician reviews your transmission, you will receive a postcard with your next transmission date.  - Your physician wants you to follow-up in: 6 months with Dr. Vallery Ridge will receive a reminder letter in the mail two months in advance. If you don't receive a letter, please call our office to schedule the follow-up appointment.   Any Additional Special Instructions Will Be Listed Below (If Applicable). - 2 gram sodium diet restriction   Low-Sodium Eating Plan Sodium raises blood pressure and causes water to be held in the body. Getting less sodium from food will help lower your blood pressure, reduce any swelling, and protect your heart, liver, and kidneys. We get sodium by adding salt (sodium chloride) to food. Most of our sodium comes from canned, boxed, and frozen foods. Restaurant foods, fast foods, and pizza are also very high in sodium. Even if you take medicine to lower your blood pressure or to reduce fluid in your body, getting less sodium from  your food is important. WHAT IS MY PLAN? Most people should limit their sodium intake to 2,300 mg a day. Your health care provider recommends that you limit your sodium intake to ___2 grams (2000 mg)_______ a day.  WHAT DO I NEED TO KNOW ABOUT THIS EATING PLAN? For the low-sodium eating plan, you will follow these general guidelines:  Choose foods with a % Daily Value for sodium of less than 5% (as listed on the food label).   Use salt-free seasonings or herbs instead of table salt or sea salt.   Check with your health care provider or pharmacist before using salt substitutes.   Eat fresh foods.  Eat more vegetables and fruits.  Limit canned vegetables. If you do use them, rinse them well to decrease the sodium.   Limit cheese to 1 oz (28 g) per day.   Eat lower-sodium products, often labeled as "lower sodium" or "no salt added."  Avoid foods that contain monosodium glutamate (MSG). MSG is sometimes added to Melissa Paul food and some canned foods.  Check food labels (Nutrition Facts labels) on foods to learn how much sodium is in one serving.  Eat more home-cooked food and less restaurant, buffet, and fast food.  When eating at a restaurant, ask that your food be prepared with less salt or none, if possible.  HOW DO I READ FOOD LABELS FOR SODIUM INFORMATION? The Nutrition Facts label lists the amount of sodium in one serving of the food. If you eat more than one serving,  you must multiply the listed amount of sodium by the number of servings. Food labels may also identify foods as:  Sodium free--Less than 5 mg in a serving.  Very low sodium--35 mg or less in a serving.  Low sodium--140 mg or less in a serving.  Light in sodium--50% less sodium in a serving. For example, if a food that usually has 300 mg of sodium is changed to become light in sodium, it will have 150 mg of sodium.  Reduced sodium--25% less sodium in a serving. For example, if a food that usually has 400  mg of sodium is changed to reduced sodium, it will have 300 mg of sodium. WHAT FOODS CAN I EAT? Grains Low-sodium cereals, including oats, puffed wheat and rice, and shredded wheat cereals. Low-sodium crackers. Unsalted rice and pasta. Lower-sodium bread.  Vegetables Frozen or fresh vegetables. Low-sodium or reduced-sodium canned vegetables. Low-sodium or reduced-sodium tomato sauce and paste. Low-sodium or reduced-sodium tomato and vegetable juices.  Fruits Fresh, frozen, and canned fruit. Fruit juice.  Meat and Other Protein Products Low-sodium canned tuna and salmon. Fresh or frozen meat, poultry, seafood, and fish. Lamb. Unsalted nuts. Dried beans, peas, and lentils without added salt. Unsalted canned beans. Homemade soups without salt. Eggs.  Dairy Milk. Soy milk. Ricotta cheese. Low-sodium or reduced-sodium cheeses. Yogurt.  Condiments Fresh and dried herbs and spices. Salt-free seasonings. Onion and garlic powders. Low-sodium varieties of mustard and ketchup. Lemon juice.  Fats and Oils Reduced-sodium salad dressings. Unsalted butter.  Other Unsalted popcorn and pretzels.  The items listed above may not be a complete list of recommended foods or beverages. Contact your dietitian for more options. WHAT FOODS ARE NOT RECOMMENDED? Grains Instant hot cereals. Bread stuffing, pancake, and biscuit mixes. Croutons. Seasoned rice or pasta mixes. Noodle soup cups. Boxed or frozen macaroni and cheese. Self-rising flour. Regular salted crackers. Vegetables Regular canned vegetables. Regular canned tomato sauce and paste. Regular tomato and vegetable juices. Frozen vegetables in sauces. Salted french fries. Olives. Melissa Paul. Relishes. Sauerkraut. Salsa. Meat and Other Protein Products Salted, canned, smoked, spiced, or pickled meats, seafood, or fish. Bacon, ham, sausage, hot dogs, corned beef, chipped beef, and packaged luncheon meats. Salt pork. Jerky. Pickled herring. Anchovies,  regular canned tuna, and sardines. Salted nuts. Dairy Processed cheese and cheese spreads. Cheese curds. Blue cheese and cottage cheese. Buttermilk.  Condiments Onion and garlic salt, seasoned salt, table salt, and sea salt. Canned and packaged gravies. Worcestershire sauce. Tartar sauce. Barbecue sauce. Teriyaki sauce. Soy sauce, including reduced sodium. Steak sauce. Fish sauce. Oyster sauce. Cocktail sauce. Horseradish. Regular ketchup and mustard. Meat flavorings and tenderizers. Bouillon cubes. Hot sauce. Tabasco sauce. Marinades. Taco seasonings. Relishes. Fats and Oils Regular salad dressings. Salted butter. Margarine. Ghee. Bacon fat.  Other Potato and tortilla chips. Corn chips and puffs. Salted popcorn and pretzels. Canned or dried soups. Pizza. Frozen entrees and pot pies.  The items listed above may not be a complete list of foods and beverages to avoid. Contact your dietitian for more information. Document Released: 06/29/2001 Document Revised: 01/12/2013 Document Reviewed: 11/11/2012 [facility] Patient Information 2015 [place], [place]. This information is not intended to replace advice given to you by your health care provider. Make sure you discuss any questions you have with your health care provider.

## 2014-06-13 ENCOUNTER — Telehealth: Payer: Self-pay | Admitting: *Deleted

## 2014-06-13 MED ORDER — ASPIRIN 81 MG PO TABS: 81.0000 mg | ORAL_TABLET | Freq: Every day | ORAL | Status: DC

## 2014-06-13 NOTE — Telephone Encounter (Signed)
Called patient to give her lab results ans she says she spoke with her GI MD and she is not going to be able to take Eliquis.  I explained to her that the aspirin will not protect her from having a stroke.  She still insist the risk is greater for her to bleed and wishes not to take the Eliquis.  Dr [name] aware

## 2014-06-23 ENCOUNTER — Encounter: Payer: Medicare Other | Admitting: *Deleted

## 2014-06-27 ENCOUNTER — Telehealth: Payer: Self-pay | Admitting: *Deleted

## 2014-06-27 NOTE — Telephone Encounter (Signed)
ICM transmission received. I spoke with the patient. She feels her weight was up over the weekend. ICM transmission was from 06/23/14. I have asked the patient to transmit again per Dr. Jackalyn Lombard request. She states she will do this later today as she has to pick up her grandchild.

## 2014-06-28 ENCOUNTER — Ambulatory Visit (INDEPENDENT_AMBULATORY_CARE_PROVIDER_SITE_OTHER): Payer: Medicare Other | Admitting: *Deleted

## 2014-06-28 DIAGNOSIS — Z95810 Presence of automatic (implantable) cardiac defibrillator: Secondary | ICD-10-CM | POA: Diagnosis not present

## 2014-06-28 DIAGNOSIS — I5022 Chronic systolic (congestive) heart failure: Secondary | ICD-10-CM | POA: Diagnosis not present

## 2014-06-28 NOTE — Progress Notes (Addendum)
EPIC Encounter for ICM Monitoring  Patient Name: Melissa Paul is a 69 y.o. female Date: 06/28/2014 Primary Care Physican: Deloria Lair, MD Primary Cardiologist: Domenic Polite Electrophysiologist: Allred Dry Weight: 182 lbs  Bi-V pacing: 93%       In the past month, have you:  1. Gained more than 2 pounds in a day or more than 5 pounds in a week? The patient reports to me that her baseline weight is 182 lbs. She weighed 197 lbs at her highest last week (maybe on Tuesday). She was 193 lbs on Sunday and 191 lbs yesterday. Today she is 192 lbs. In reviewing her weights in EPIC, she was 199 lbs on 06/08/14. Prior to that she was 189 lbs on 04/26/14 and in March.   2. Had changes in your medications (with verification of current medications)? When Dr. [name] saw her at her last office visit, her corvue readings had been up some. He increased her lasix to 80 mg BID x 2 days, then she resumed her regular dosing. She tells me she started taking lasix 80 mg BID again on Friday 6/3 since her weight's had been up and she was SOB.  3. Had more shortness of breath than is usual for you? Yes.  4. Limited your activity because of shortness of breath? no  5. Not been able to sleep because of shortness of breath? no  6. Had increased swelling in your feet or ankles? Yes. She did have this again today, but she has been laying down quite a bit as she has not felt well and her swelling has improved.   7. Had symptoms of dehydration (dizziness, dry mouth, increased thirst, decreased urine output) Yes- she has been dizzy, but she reports this has been going on for a while and is not a new problem.  8. Had changes in sodium restriction? No. She states her appetite has decreased.  9. Been compliant with medication? Yes   ICM trend:   Follow-up plan: ICM clinic phone appointment: pending- the patient is a little bit difficult for me to read today. I spoke with her yesterday after a transmission was sent on 6/2,  but with her continued complaints of weight gain and SOB, she was asked to transmit again. She did this late yesterday afternoon. Her corvue trends do reveal that her fluids were elevated from ~ 5/27-6/4. She is currently back at her baseline. She does tell me today that she developed a cough over the weekend, that is non-productive, just hacky and aggravating. She states she had a similar cough when she was diagnosed with CHF. She is reporting some chest discomfort with this. I advised the patient I would need to discuss her situation with Dr. Domenic Polite and Dr. [name]. Dr. Domenic Polite did have an opening on his schedule on 6/9 in the afternoon, but she could not back this because of another scheduling conflict. I explained I would call her back on Thursday when I'm back in the office/ after her MD's review. As she seems complicated today, I feel like a follow up with Dr. Domenic Polite may be warranted, but there are no other openings on his schedule until mid- July. The patient is agreeable and will await a call back. She will continue her BID dosing on her lasix until that time.   Copy of note sent to patient's primary care physician, primary cardiologist, and device following physician.  [name], RN, BSN 06/28/2014 6:14 PM   She can likely be placed on the  extender schedule in [place].        ----- Message -----     From: Melissa Filbert, RN     Sent: 06/28/2014  6:28 PM      To: Satira Sark, MD        I left a message for the patient to call and schedule follow up.

## 2014-06-28 NOTE — Telephone Encounter (Signed)
Repeat ICM transmission received. I attempted to call the patient. No answer x multiple rings. I will call her back.

## 2014-06-30 ENCOUNTER — Telehealth: Payer: Self-pay | Admitting: *Deleted

## 2014-06-30 NOTE — Telephone Encounter (Signed)
Dr. Domenic Polite reviewed my most recent ICM note on the patient. I did feel that she would benefit from an office visit with him. I offered her one on 06/30/14 on [place] with Dr. Domenic Polite earlier this week and she was unable to make the appointment. Per Dr. Domenic Polite, an extender may be able to see her in the [place] office. I have left a message for the patient to call.

## 2014-06-30 NOTE — Telephone Encounter (Signed)
Late entry. I spoke with the patient on 06/28/14.

## 2014-07-04 ENCOUNTER — Other Ambulatory Visit: Payer: Self-pay | Admitting: *Deleted

## 2014-07-04 MED ORDER — FUROSEMIDE 80 MG PO TABS: 80.0000 mg | ORAL_TABLET | Freq: Every day | ORAL | Status: DC

## 2014-07-07 ENCOUNTER — Encounter: Payer: Self-pay | Admitting: *Deleted

## 2014-07-07 NOTE — Telephone Encounter (Signed)
I called the patient today to follow up with her symptoms and weights. She states that she is still having a non- productive cough and now has a sore throat. Her temp is running ~ 99.9. She is scheduled to follow up with Dr. Scotty Court on Monday (6/20). Her weights are running steady around 193-195 lbs. She will have some intermittent abdominal bloating. She is back on her lasix 80 mg once daily. I offered her a follow up appointment with the PA/ NP in the [facility] office as per Dr. Domenic Polite. She has declined at this time. She will wait to see Dr. Scotty Court on Monday and then call me back and let me know if she needs to follow up with the extender after that. I will schedule her next ICM transmission for 08/01/14. She is agreeable.

## 2014-08-01 ENCOUNTER — Ambulatory Visit (INDEPENDENT_AMBULATORY_CARE_PROVIDER_SITE_OTHER): Payer: Medicare Other | Admitting: *Deleted

## 2014-08-01 DIAGNOSIS — I5022 Chronic systolic (congestive) heart failure: Secondary | ICD-10-CM

## 2014-08-01 DIAGNOSIS — Z95810 Presence of automatic (implantable) cardiac defibrillator: Secondary | ICD-10-CM | POA: Diagnosis not present

## 2014-08-02 ENCOUNTER — Telehealth: Payer: Self-pay | Admitting: *Deleted

## 2014-08-02 NOTE — Telephone Encounter (Signed)
ICM transmission received. I left a message for the patient to call. 

## 2014-08-03 NOTE — Telephone Encounter (Signed)
Follow up     Pt returning Heather's call.  Please call to advise

## 2014-08-04 ENCOUNTER — Encounter: Payer: Self-pay | Admitting: *Deleted

## 2014-08-04 NOTE — Progress Notes (Signed)
EPIC Encounter for ICM Monitoring  Patient Name: Melissa Paul is a 69 y.o. female Date: 08/04/2014 Primary Care Physican: Deloria Lair, MD Primary Cardiologist: Domenic Polite Electrophysiologist: Allred Dry Weight: I am uncertain where her "dry weight" is at present- she had reported to me this was 182 lbs.   Bi-V pacing: 93%      In the past month, have you:  1. Gained more than 2 pounds in a day or more than 5 pounds in a week? Uncertain, she is not weighing every day. She tells me today that over the last month, her weight runs from 192-200 lbs. When I spoke with her on 06/28/14 She reported that she was 192 lbs, but the week prior had been 197 lbs. Per EPIC, her weight 04/26/14 was 189 lbs and then 06/08/14 she was 199 lbs. The last time she actually weighed was on Friday 7/8 and she was 195 lbs.  2. Had changes in your medications (with verification of current medications)? no  3. Had more shortness of breath than is usual for you? She reports SOB walking from one room to the next.  4. Limited your activity because of shortness of breath? no  5. Not been able to sleep because of shortness of breath? no  6. Had increased swelling in your feet or ankles? Yes. She reports swelling to her feet/ legs and fingers.   7. Had symptoms of dehydration (dizziness, dry mouth, increased thirst, decreased urine output) no  8. Had changes in sodium restriction? no  9. Been compliant with medication? Yes   ICM trend:   Follow-up plan: ICM clinic phone appointment: 09/07/14. The patient has had periodic increases in her corvue readings from ~6/9-6/13, 6/29-7/3, and from 7/7-7/9. She reports compliance with her medications, but she is having shifts in her weight from 192-200 lbs. She is having edema to her extremities. Symptoms appear similar to last month. I attempted to bring the patient in for an office visit with Dr. McDowell/ PA. I had left the patient a message to call and scheduled and never  heard back from her. She is currently due to see Dr. Domenic Polite on 08/26/14. I inquired if the patient wanted me to look for a sooner appointment and she wanted to wait until 08/26/14. I advised her I would forward to Dr. Domenic Polite to review and call her back with any recommendations prior to her follow up with him on 08/26/14. Her readings appear to be stable today. No changes made. Will await MD review.  Copy of note sent to patient's primary care physician, primary cardiologist, and device following physician.  [name], RN, BSN 08/04/2014 2:07 PM

## 2014-08-04 NOTE — Telephone Encounter (Signed)
I spoke with the patient. 

## 2014-08-05 NOTE — Addendum Note (Signed)
Addended by: Satira Sark on: 08/05/2014 07:53 AM   Modules accepted: Level of Service

## 2014-08-05 NOTE — Progress Notes (Signed)
I last saw Ms. Smits in April. Chart reviewed. Difficult to track weight, inconsistent measurements. Patient reporting leg edema per note. Please verify that Lasix has been 80 mg daily as per chart, also on Aldactone. Suggest increasing Lasix to 80 mg twice daily over the weekend then return to previous dose. Not clear to me that she has been using Lasix as needed based on weight changes at home, this needs to be reviewed with her in clinic. If necessary, visit with me or extender could be made prior to her anticipated visit in early August, presuming the patient is agreeable.

## 2014-08-25 ENCOUNTER — Encounter: Payer: Self-pay | Admitting: Cardiology

## 2014-08-25 ENCOUNTER — Ambulatory Visit (INDEPENDENT_AMBULATORY_CARE_PROVIDER_SITE_OTHER): Payer: Medicare Other | Admitting: Cardiology

## 2014-08-25 VITALS — BP 102/67 | HR 73 | Ht 64.0 in | Wt 194.0 lb

## 2014-08-25 DIAGNOSIS — I429 Cardiomyopathy, unspecified: Secondary | ICD-10-CM | POA: Diagnosis not present

## 2014-08-25 DIAGNOSIS — I472 Ventricular tachycardia, unspecified: Secondary | ICD-10-CM

## 2014-08-25 DIAGNOSIS — I480 Paroxysmal atrial fibrillation: Secondary | ICD-10-CM | POA: Diagnosis not present

## 2014-08-25 DIAGNOSIS — I428 Other cardiomyopathies: Secondary | ICD-10-CM

## 2014-08-25 DIAGNOSIS — I5022 Chronic systolic (congestive) heart failure: Secondary | ICD-10-CM | POA: Diagnosis not present

## 2014-08-25 MED ORDER — FUROSEMIDE 80 MG PO TABS: 80.0000 mg | ORAL_TABLET | Freq: Every day | ORAL | Status: DC

## 2014-08-25 NOTE — Progress Notes (Signed)
.    Cardiology Office Note  Date: 08/25/2014   ID: Melissa Paul, DOB July 17, 1945, MRN [id number]  PCP: Melissa Lair, MD  Primary Cardiologist: Melissa Lesches, MD   Chief Complaint  Patient presents with  . Cardiomyopathy  . PAF    History of Present Illness: Melissa Paul is a 69 y.o. female last seen in April. She continues to follow in the device clinic with Dr. Rayann Paul. She was started on Eliquis by Dr. Rayann Paul back in May with increasing PAF burden, taken off aspirin at that time. Chart indicates that the patient decided to stop Eliquis, concerned about bleeding risk. Extensive chart notes reviewed regarding follow-up of fluctuating weight and Corvue measurements. She has not been able to follow-up with me until today, although offered earlier visits.  She comes in today with a family member for follow-up. She has been taking Lasix 80 mg daily instead of 40 mg daily, and reports that her weight is coming down by her scales at home. By our scales it looks like it's actually up a few pounds. We discussed her fluctuating weight, she denies any increase in sodium or fluid intake, no marked change in her diet. She also reports compliance with her medications.  Cardiac catheterization from March of this year showed no angiographic evidence of obstructive CAD, and relatively normal right heart pressures.  Patient eager to return to driving in mid August, and was cleared to do so by Dr. Rayann Paul (note from 06/08/2014) in the absence of any recurring ventricular arrhythmias.  Past Medical History  Diagnosis Date  . Nonischemic cardiomyopathy     LVEF 35-40% by echo 8/13  . Chronic systolic heart failure   . LBBB (left bundle branch block)   . Hypothyroidism   . UTI (urinary tract infection)     Recurrent  . Glucose intolerance (pre-diabetes)   . Hyperlipemia   . Mitral regurgitation     Mild to Moderate  . Paroxysmal atrial fibrillation     Brief episodes by device interrogation   . Ventricular tachycardia     ICD therapy for VT    Past Surgical History  Procedure Laterality Date  . Cholecystectomy    . Total abdominal hysterectomy      (R) ovary removed  . Appendectomy    . Knee arthroscopy      Left  . Cardiac defibrillator placement  09/2009    St. Jude BiV ICD by Dr. Sandria Manly class III  . Left and right heart catheterization with coronary angiogram N/A 04/12/2014    Procedure: LEFT AND RIGHT HEART CATHETERIZATION WITH CORONARY ANGIOGRAM;  Surgeon: Leonie Man, MD;  Location: [facility] CATH LAB;  Service: Cardiovascular;  Laterality: N/A;    Current Outpatient Prescriptions  Medication Sig Dispense Refill  . aspirin 81 MG tablet Take 1 tablet (81 mg total) by mouth daily. 30 tablet   . carvedilol (COREG) 12.5 MG tablet Take 1 tablet (12.5 mg total) by mouth 2 (two) times daily. 180 tablet 3  . cetirizine (ZYRTEC) 10 MG tablet Take 10 mg by mouth daily as needed for allergies or rhinitis.     Marland Kitchen diazepam (VALIUM) 5 MG tablet Take one tablet every morning per patient    . digoxin (LANOXIN) 0.125 MG tablet Take 1 tablet by mouth daily.     . furosemide (LASIX) 80 MG tablet Take 1 tablet (80 mg total) by mouth daily. May take an additional 1/2 tablet (40 mg) as needed for weight gain of 3  lbs in a 24 hour period 45 tablet 6  . levothyroxine (SYNTHROID, LEVOTHROID) 75 MCG tablet Take 37.5 mcg by mouth daily.     Marland Kitchen losartan (COZAAR) 25 MG tablet Take 1/2 tab (12.5mg ) by mouth daily 15 tablet 6  . ondansetron (ZOFRAN) 4 MG tablet Take 4 mg by mouth every 8 (eight) hours as needed for nausea or vomiting.    . ranitidine (ZANTAC) 150 MG capsule Take 150 mg by mouth daily.     Marland Kitchen spironolactone (ALDACTONE) 25 MG tablet Take 25 mg by mouth daily.      No current facility-administered medications for this visit.    Allergies:  Bactrim; Cephalexin; Clindamycin; Contrast media; Latex; Levofloxacin; Nitrofurantoin monohyd macro; and Penicillins   Social History: The  patient  reports that she quit smoking about 18 years ago. Her smoking use included Cigarettes. She started smoking about 65 years ago. She has a 6 pack-year smoking history. She has never used smokeless tobacco. She reports that she does not drink alcohol or use illicit drugs.   ROS:  Please see the history of present illness. Otherwise, complete review of systems is positive for no palpitations or device shocks.  All other systems are reviewed and negative.   Physical Exam: VS:  BP 102/67 mmHg  Pulse 73  Ht 5\' 4"  (1.626 m)  Wt 194 lb (87.998 kg)  BMI 33.28 kg/m2, BMI Body mass index is 33.28 kg/(m^2).  Wt Readings from Last 3 Encounters:  08/25/14 194 lb (87.998 kg)  06/08/14 199 lb 9.6 oz (90.538 kg)  04/26/14 189 lb 6.4 oz (85.911 kg)     Overweight woman, appears comfortable at rest.  HEENT: Conjunctiva and lids normal, oropharynx clear with poor dentition. Neck: Supple, no elevated JVP, no carotid bruits, no thyromegaly.  Lungs: Clear to auscultation with decreased breath sounds, nonlabored breathing at rest.  Cardiac: Regular rate and rhythm, no S3 or significant systolic murmur, no pericardial rub.  Abdomen: Soft, nontender, bowel sounds present, no guarding or rebound.  Extremities: 1+ edema and adipose tissue, stasis. Skin: Warm and dry. Musculoskeletal: No kyphosis. Neuropsychiatric: Alert and oriented 3, affect appropriate.   ECG: ECG is not ordered today.   Recent Labwork: 03/16/2014: Magnesium 2.2; TSH 3.22 06/08/2014: BUN 14; Creatinine, Ser 0.78; Hemoglobin 13.2; Platelets 286.0; Potassium 4.2; Sodium 136   Other Studies Reviewed Today:  Echocardiogram 03/24/2014: Study Conclusions  - Left ventricle: Systolic function was moderately reduced. Estimated LVEF 40%. Global hypokinesis was seen. The cavity size was normal. Wall thickness was increased in a pattern of mild LVH. Features are consistent with a pseudonormal left ventricular filling  pattern, with concomitant abnormal relaxation and increased filling pressure (grade 2 diastolic dysfunction). Doppler parameters are consistent with high ventricular filling pressure. - Left atrium: The atrium was mildly dilated. Volume/bsa, ES, (1-plane Simpson&'s, A2C): 30.3 ml/m^2. - Right ventricle: Pacer wire or catheter noted in right ventricle. Systolic function was mildly reduced. - Right atrium: The atrium was mildly dilated. Pacer wire or catheter noted in right atrium. - Tricuspid valve: There was mild regurgitation.   Assessment and Plan:  1. Chronic combined heart failure, LVEF 40% with grade 2 diastolic dysfunction. She is having problems with fluctuating weights and fluid retention. We discussed her diet in detail, and she denies any substantial indiscretions. Lasix has been increased from 40 mg daily to 80 mg daily baseline dose. We are modifying prescription so that she can use an extra 40 mg daily as needed for weight increase of  3 pounds in 24 hours. If this is not effective, standing Lasix dose can be further advanced versus a switch to Demadex. Follow-up scheduled.  2. Nonischemic cardiomyopathy with LVEF 40% and normal coronary arteries.  3. Paroxysmal atrial fibrillation. Chart reviewed. Patient decided to stop Eliquis and go back to aspirin. She was very concerned about bleeding risk, and does not want to go back on anticoagulation.  4. History of ventricular tachycardia, St. Jude biventricular ICD in place and followed by Dr. Rayann Paul.  Current medicines were reviewed with the patient today.   Orders Placed This Encounter  Procedures  . Basic metabolic panel    Disposition: FU with me in 3 months.   Signed, Satira Sark, MD, [facility] 08/25/2014 1:14 PM    [facility] [place], [place], [place] [address] Phone: [phone]; Fax: [phone]

## 2014-08-25 NOTE — Patient Instructions (Signed)
Your physician has recommended you make the following change in your medication:   May take an additional furosemide 1/2 tablet (40 mg) as needed for weight gain of 3 lbs in a 24 hour period  Continue all other medications the same. Your physician recommends that you return for lab work in: 3 months just before your next visit to check your BMET. Your physician recommends that you schedule a follow-up appointment in: 3 months. You will receive a reminder letter in the mail in about 1 month reminding you to call and schedule your appointment. If you don't receive this letter, please contact our office.

## 2014-08-26 ENCOUNTER — Ambulatory Visit: Payer: Medicare Other | Admitting: Cardiology

## 2014-09-07 ENCOUNTER — Ambulatory Visit (INDEPENDENT_AMBULATORY_CARE_PROVIDER_SITE_OTHER): Payer: Medicare Other | Admitting: *Deleted

## 2014-09-07 ENCOUNTER — Encounter: Payer: Self-pay | Admitting: *Deleted

## 2014-09-07 ENCOUNTER — Encounter: Payer: Self-pay | Admitting: Internal Medicine

## 2014-09-07 DIAGNOSIS — I5022 Chronic systolic (congestive) heart failure: Secondary | ICD-10-CM | POA: Diagnosis not present

## 2014-09-07 DIAGNOSIS — Z95810 Presence of automatic (implantable) cardiac defibrillator: Secondary | ICD-10-CM | POA: Diagnosis not present

## 2014-09-07 DIAGNOSIS — I428 Other cardiomyopathies: Secondary | ICD-10-CM

## 2014-09-07 DIAGNOSIS — I429 Cardiomyopathy, unspecified: Secondary | ICD-10-CM | POA: Diagnosis not present

## 2014-09-07 NOTE — Progress Notes (Signed)
EPIC Encounter for ICM Monitoring  Patient Name: Melissa Paul is a 69 y.o. female Date: 09/07/2014 Primary Care Physican: Deloria Lair, MD Primary Cardiologist: Domenic Polite Electrophysiologist: Allred Dry Weight: Uncertain at this time   Bi-V pacing: 93%      In the past month, have you:  1. Gained more than 2 pounds in a day or more than 5 pounds in a week? The patient's weight has been from 192-200 lbs over the last few months. She reports today that over the last few days, she has been from 191-194 lbs. Today she is 192 lbs and yesterday she was 194 lbs. The patient reports she has been faithful at weighing herself daily.  2. Had changes in your medications (with verification of current medications)? The patient was seen by Dr. Domenic Polite on 08/25/14 and was instructed she may take an extra 40 mg of lasix daily as needed for weight gain of 3 lbs or more in 24 hours. She has had to take an extra 40 mg of lasix x 1 dose since her office visit.  3. Had more shortness of breath than is usual for you? No. She does have a persistent non-cough over the last 3 months and is following up with her PCP tomorrow.  4. Limited your activity because of shortness of breath? no  5. Not been able to sleep because of shortness of breath? no  6. Had increased swelling in your feet or ankles? She reports one episode of lower extremity edema since she was on 08/25/14.  7. Had symptoms of dehydration (dizziness, dry mouth, increased thirst, decreased urine output) no  8. Had changes in sodium restriction? no  9. Been compliant with medication? Yes   ICM trend:   Follow-up plan: ICM clinic phone appointment: 10/10/14. The patient's impedence last showed some elevation in her fluid readings from ~ 8/1-8/6 but she is presently stable. No changes made today.   Copy of note sent to patient's primary care physician, primary cardiologist, and device following physician.  [name], RN 09/07/2014 3:35  PM

## 2014-09-07 NOTE — Progress Notes (Signed)
Remote ICD transmission.   

## 2014-09-14 LAB — CUP PACEART REMOTE DEVICE CHECK
Battery Remaining Longevity: 24 mo
Battery Remaining Percentage: 29 %
Battery Voltage: 2.84 V
Brady Statistic AP VP Percent: 87 %
Brady Statistic AP VS Percent: 1.4 %
Brady Statistic AS VP Percent: 7.1 %
Brady Statistic AS VS Percent: 1 %
Brady Statistic RA Percent Paced: 83 %
Date Time Interrogation Session: 20160817073157
HighPow Impedance: 75 Ohm
HighPow Impedance: 75 Ohm
Lead Channel Impedance Value: 530 Ohm
Lead Channel Impedance Value: 530 Ohm
Lead Channel Impedance Value: 560 Ohm
Lead Channel Pacing Threshold Amplitude: 0.5 V
Lead Channel Pacing Threshold Amplitude: 0.5 V
Lead Channel Pacing Threshold Amplitude: 0.625 V
Lead Channel Pacing Threshold Pulse Width: 0.5 ms
Lead Channel Pacing Threshold Pulse Width: 0.5 ms
Lead Channel Pacing Threshold Pulse Width: 0.5 ms
Lead Channel Sensing Intrinsic Amplitude: 12 mV
Lead Channel Sensing Intrinsic Amplitude: 4.5 mV
Lead Channel Setting Pacing Amplitude: 2 V
Lead Channel Setting Pacing Amplitude: 2 V
Lead Channel Setting Pacing Amplitude: 2.5 V
Lead Channel Setting Pacing Pulse Width: 0.5 ms
Lead Channel Setting Pacing Pulse Width: 0.5 ms
Lead Channel Setting Sensing Sensitivity: 0.5 mV
Pulse Gen Serial Number: 794739
Zone Setting Detection Interval: 270 ms
Zone Setting Detection Interval: 340 ms

## 2014-09-23 ENCOUNTER — Encounter: Payer: Self-pay | Admitting: Cardiology

## 2014-10-10 ENCOUNTER — Ambulatory Visit (INDEPENDENT_AMBULATORY_CARE_PROVIDER_SITE_OTHER): Payer: Medicare Other

## 2014-10-10 DIAGNOSIS — Z95810 Presence of automatic (implantable) cardiac defibrillator: Secondary | ICD-10-CM

## 2014-10-10 DIAGNOSIS — I5022 Chronic systolic (congestive) heart failure: Secondary | ICD-10-CM | POA: Diagnosis not present

## 2014-10-10 NOTE — Progress Notes (Addendum)
EPIC Encounter for ICM Monitoring  Patient Name: Melissa Paul is a 69 y.o. female Date: 10/10/2014 Primary Care Physican: Deloria Lair, MD Primary Cardiologist: Domenic Polite Electrophysiologist: Allred Dry Weight: 193 lbs       Bi-V Pacing 93%  In the past month, have you:  1. Gained more than 2 pounds in a day or more than 5 pounds in a week? no  2. Had changes in your medications (with verification of current medications)? no  3. Had more shortness of breath than is usual for you? no  4. Limited your activity because of shortness of breath? no  5. Not been able to sleep because of shortness of breath? no  6. Had increased swelling in your feet or ankles? no  7. Had symptoms of dehydration (dizziness, dry mouth, increased thirst, decreased urine output) no  8. Had changes in sodium restriction? no  9. Been compliant with medication? Yes   ICM trend:   Follow-up plan: ICM clinic phone appointment on 11/14/2014.  CorVue impedance trending slightly above baseline.  Patient has an appointment with PCP today for a cough which is not a new symptom.  She denied any HF symptoms.  She has not had to take any extra Lasix.  No changes today.   Copy of note sent to patient's primary care physician, primary cardiologist, and device following physician.  [name], RN, CCM 10/10/2014 11:38 AM

## 2014-10-22 ENCOUNTER — Other Ambulatory Visit: Payer: Self-pay | Admitting: Cardiology

## 2014-11-14 ENCOUNTER — Ambulatory Visit (INDEPENDENT_AMBULATORY_CARE_PROVIDER_SITE_OTHER): Payer: Medicare Other

## 2014-11-14 ENCOUNTER — Telehealth: Payer: Self-pay | Admitting: Cardiology

## 2014-11-14 DIAGNOSIS — I5022 Chronic systolic (congestive) heart failure: Secondary | ICD-10-CM | POA: Diagnosis not present

## 2014-11-14 DIAGNOSIS — Z95810 Presence of automatic (implantable) cardiac defibrillator: Secondary | ICD-10-CM

## 2014-11-14 NOTE — Telephone Encounter (Signed)
Spoke with pt and reminded pt of remote transmission that is due today. Pt verbalized understanding.   

## 2014-11-15 ENCOUNTER — Telehealth: Payer: Self-pay

## 2014-11-15 NOTE — Telephone Encounter (Signed)
ICM transmission received.  Attempted patient call and left message for return call.  

## 2014-11-15 NOTE — Telephone Encounter (Signed)
F/u ° ° °Pt returning your call °

## 2014-11-16 NOTE — Telephone Encounter (Signed)
Spoke with patient.

## 2014-11-16 NOTE — Progress Notes (Signed)
EPIC Encounter for ICM Monitoring  Patient Name: Melissa Paul is a 69 y.o. female Date: 11/16/2014 Primary Care Physican: Melissa Lair, MD Primary Cardiologist: Melissa Paul Electrophysiologist: Melissa Paul Dry Weight: 193 lb       Bi-V Pacing 93%  In the past month, have you:  1. Gained more than 2 pounds in a day or more than 5 pounds in a week? Yes, 4 lbs.  Had gained up to 197 lbs.  2. Had changes in your medications (with verification of current medications)? no  3. Had more shortness of breath than is usual for you? Yes, more tightness in chest in the last 2 weeks.   4. Limited your activity because of shortness of breath? no  5. Not been able to sleep because of shortness of breath? no  6. Had increased swelling in your feet or ankles? Yes in legs/feet  7. Had symptoms of dehydration (dizziness, dry mouth, increased thirst, decreased urine output) no  8. Had changes in sodium restriction? no  9. Been compliant with medication? Yes   ICM trend: 11/14/2014   Follow-up plan: ICM clinic phone appointment on 01/03/2015 and has appointment with Dr Melissa Paul 12/05/2014.  Corvue impedance below baseline 11/03/2014 to 11/12/2014 and she reported she has had kidney/bladder infection for past 2 weeks.  She stated she had not been able to urinate as much as she usually does.  She is on 10 day course of antibiotics.  She has taken extra Furosemide dosage twice in the last 2 weeks for weight gain as prescribed.  Extra Furosemide has been effective for decreasing symptoms.  Weight has returned close to baseline.  Education given to contact office for future in the event the extra Furosemide dosages does not relieve symptoms within a couple of days or if symptoms worsen to decrease risk of hospitalization.    Copy of note sent to patient's primary care physician, primary cardiologist, and device following physician.  [name], RN, CCM 11/16/2014 9:50 AM

## 2014-11-25 ENCOUNTER — Ambulatory Visit (INDEPENDENT_AMBULATORY_CARE_PROVIDER_SITE_OTHER): Payer: Medicare Other | Admitting: Cardiology

## 2014-11-25 ENCOUNTER — Encounter: Payer: Self-pay | Admitting: Cardiology

## 2014-11-25 VITALS — BP 95/65 | HR 61 | Ht 64.0 in | Wt 191.4 lb

## 2014-11-25 DIAGNOSIS — I428 Other cardiomyopathies: Secondary | ICD-10-CM

## 2014-11-25 DIAGNOSIS — I480 Paroxysmal atrial fibrillation: Secondary | ICD-10-CM

## 2014-11-25 DIAGNOSIS — I5022 Chronic systolic (congestive) heart failure: Secondary | ICD-10-CM | POA: Diagnosis not present

## 2014-11-25 DIAGNOSIS — Z95810 Presence of automatic (implantable) cardiac defibrillator: Secondary | ICD-10-CM | POA: Diagnosis not present

## 2014-11-25 DIAGNOSIS — I429 Cardiomyopathy, unspecified: Secondary | ICD-10-CM | POA: Diagnosis not present

## 2014-11-25 NOTE — Patient Instructions (Signed)
Your physician recommends that you continue on your current medications as directed. Please refer to the Current Medication list given to you today.  Your physician recommends that you schedule a follow-up appointment in: 3 months  

## 2014-11-25 NOTE — Progress Notes (Signed)
Cardiology Office Note  Date: 11/25/2014   ID: AMARIONNA ARCA, DOB 1945/05/03, MRN [id number]  PCP: Deloria Lair, MD  Primary Cardiologist: Rozann Lesches, MD   Chief Complaint  Patient presents with  . Cardiomyopathy  . PAF  . Hospitalization Follow-up    History of Present Illness: Melissa Paul is a 69 y.o. female last seen in August. At that time she was continued on Lasix 80 mg daily with understanding that an additional 40 mg could be used for increases in weight.  She was seen in the [facility] ER on October 30 with symptoms of headache and dizziness, had been undergoing recent treatment for UTI on Cipro. Vital signs were stable, she had a head CT that showed no acute intracranial findings with probable mild chronic small vessel ischemic changes in the periventricular white matter. She was found to be hyperglycemic with a glucose of 247, other lab work is noted below. She has been placed on metformin and has a follow-up visit with Dr. Scotty Court.  Cardiac catheterization from March of this year showed no angiographic evidence of obstructive CAD, and relatively normal right heart pressures.  She does have a history of PAF documented on device interrogation and was started on Eliquis by Dr. [name] in lieu of aspirin. The patient decided to stop Eliquis however and has gone back to aspirin despite discussions of stroke risk.  Today she states that she is still weak, but will be completing antibiotics soon, and has had improvement in urinary symptoms. She has stable NYHA class II dyspnea, no chest pain or syncope.  We reviewed her medications which are outlined below.  Past Medical History  Diagnosis Date  . Nonischemic cardiomyopathy ([place])     LVEF 35-40% by echo 8/13  . Chronic systolic heart failure ([place])   . LBBB (left bundle branch block)   . Hypothyroidism   . UTI (urinary tract infection)     Recurrent  . Glucose intolerance (pre-diabetes)   . Hyperlipemia   .  Mitral regurgitation     Mild to Moderate  . Paroxysmal atrial fibrillation ([facility])     Brief episodes by device interrogation  . Ventricular tachycardia ([place])     ICD therapy for VT    Past Surgical History  Procedure Laterality Date  . Cholecystectomy    . Total abdominal hysterectomy      (R) ovary removed  . Appendectomy    . Knee arthroscopy      Left  . Cardiac defibrillator placement  09/2009    St. Jude BiV ICD by Dr. Sandria Manly class III  . Left and right heart catheterization with coronary angiogram N/A 04/12/2014    Procedure: LEFT AND RIGHT HEART CATHETERIZATION WITH CORONARY ANGIOGRAM;  Surgeon: Leonie Man, MD;  Location: [facility] CATH LAB;  Service: Cardiovascular;  Laterality: N/A;    Current Outpatient Prescriptions  Medication Sig Dispense Refill  . aspirin 81 MG tablet Take 1 tablet (81 mg total) by mouth daily. 30 tablet   . carvedilol (COREG) 12.5 MG tablet TAKE ONE TABLET BY MOUTH TWICE DAILY 180 tablet 3  . cetirizine (ZYRTEC) 10 MG tablet Take 10 mg by mouth daily as needed for allergies or rhinitis.     . ciprofloxacin (CIPRO) 500 MG tablet Take 500 mg by mouth 2 (two) times daily.    . diazepam (VALIUM) 5 MG tablet Take one tablet every morning per patient    . digoxin (LANOXIN) 0.125 MG tablet Take 1 tablet by  mouth daily.     . furosemide (LASIX) 80 MG tablet Take 1 tablet (80 mg total) by mouth daily. May take an additional 1/2 tablet (40 mg) as needed for weight gain of 3 lbs in a 24 hour period 45 tablet 6  . levothyroxine (SYNTHROID, LEVOTHROID) 75 MCG tablet Take 37.5 mcg by mouth daily.     Marland Kitchen losartan (COZAAR) 25 MG tablet Take 1/2 tab (12.5mg ) by mouth daily 15 tablet 6  . metFORMIN (GLUCOPHAGE) 500 MG tablet Take 500 mg by mouth 2 (two) times daily.    . ondansetron (ZOFRAN) 4 MG tablet Take 4 mg by mouth every 8 (eight) hours as needed for nausea or vomiting.    . ranitidine (ZANTAC) 150 MG capsule Take 150 mg by mouth daily.     Marland Kitchen spironolactone  (ALDACTONE) 25 MG tablet Take 25 mg by mouth daily.      No current facility-administered medications for this visit.    Allergies:  Bactrim; Cephalexin; Clindamycin; Contrast media; Latex; Levofloxacin; Nitrofurantoin monohyd macro; and Penicillins   Social History: The patient  reports that she quit smoking about 18 years ago. Her smoking use included Cigarettes. She started smoking about 65 years ago. She has a 6 pack-year smoking history. She has never used smokeless tobacco. She reports that she does not drink alcohol or use illicit drugs.   ROS:  Please see the history of present illness. Otherwise, complete review of systems is positive for generalized weakness. No fevers or chills.  All other systems are reviewed and negative.   Physical Exam: VS:  BP 95/65 mmHg  Pulse 61  Ht 5\' 4"  (1.626 m)  Wt 191 lb 6.4 oz (86.818 kg)  BMI 32.84 kg/m2  SpO2 96%, BMI Body mass index is 32.84 kg/(m^2).  Wt Readings from Last 3 Encounters:  11/25/14 191 lb 6.4 oz (86.818 kg)  08/25/14 194 lb (87.998 kg)  06/08/14 199 lb 9.6 oz (90.538 kg)     Overweight woman, appears comfortable at rest.  HEENT: Conjunctiva and lids normal, oropharynx clear with poor dentition. Neck: Supple, no elevated JVP, no carotid bruits, no thyromegaly.  Lungs: Clear to auscultation with decreased breath sounds, nonlabored breathing at rest.  Cardiac: Regular rate and rhythm, no S3 or significant systolic murmur, no pericardial rub.  Abdomen: Soft, nontender, bowel sounds present, no guarding or rebound.  Extremities: No significant pitting edema and adipose tissue, stasis. Skin: Warm and dry. Musculoskeletal: No kyphosis. Neuropsychiatric: Alert and oriented 3, affect appropriate.   ECG: Tracing from 03/16/2014 showed normal sinus rhythm with ventricular pacing spikes noted.   Recent Labwork: 03/16/2014: Magnesium 2.2; TSH 3.22 06/08/2014: BUN 14; Creatinine, Ser 0.78; Hemoglobin 13.2; Platelets 286.0;  Potassium 4.2; Sodium 136  October 2016: Sodium 136, potassium 4.2, BUN 13, creatinine 1.0, glucose 247, AST 12, ALT 15, WBC 10.9, hemoglobin 14.4, platelets 283  Other Studies Reviewed Today:  Echocardiogram 03/24/2014: Study Conclusions  - Left ventricle: Systolic function was moderately reduced. Estimated LVEF 40%. Global hypokinesis was seen. The cavity size was normal. Wall thickness was increased in a pattern of mild LVH. Features are consistent with a pseudonormal left ventricular filling pattern, with concomitant abnormal relaxation and increased filling pressure (grade 2 diastolic dysfunction). Doppler parameters are consistent with high ventricular filling pressure. - Left atrium: The atrium was mildly dilated. Volume/bsa, ES, (1-plane Simpson&'s, A2C): 30.3 ml/m^2. - Right ventricle: Pacer wire or catheter noted in right ventricle. Systolic function was mildly reduced. - Right atrium: The atrium was  mildly dilated. Pacer wire or catheter noted in right atrium. - Tricuspid valve: There was mild regurgitation.  Assessment and Plan:  1. Chronic combined heart failure. Plan to continue current diuretic regimen. Reinforced checking daily weights, fluid and salt restriction guidelines. She can use an extra 40 mg Lasix on days when she gains 2-3 pounds in 24 hours. Recent renal function was in normal range.  2. Nonischemic cardiomyopathy with LVEF 40% and grade 2 diastolic dysfunction.  3. Paroxysmal atrial fibrillation. She was placed on pelvic for stroke prophylaxis however decided to stop the medication and wants to stay on aspirin alone. Stroke risk has been discussed. CHADSVASC score is 4.  4. St. Jude biventricular ICD in place, followed by Dr. [name].  5. History of glucose intolerance, likely type 2 diabetes mellitus at this point. Recently started on metformin at ER visit, follow-up arranged with Dr. Scotty Court.  Current medicines were reviewed with the  patient today.   Disposition: FU with me in 3 months.   Signed, Satira Sark, MD, [facility] 11/25/2014 10:42 AM    [place] [facility] [place], [place], [place] [address] Phone: [phone]; Fax: [phone]

## 2014-12-05 ENCOUNTER — Encounter: Payer: Self-pay | Admitting: Internal Medicine

## 2014-12-05 ENCOUNTER — Ambulatory Visit (INDEPENDENT_AMBULATORY_CARE_PROVIDER_SITE_OTHER): Payer: Medicare Other | Admitting: Internal Medicine

## 2014-12-05 VITALS — BP 92/80 | HR 68 | Ht 64.0 in | Wt 192.4 lb

## 2014-12-05 DIAGNOSIS — I480 Paroxysmal atrial fibrillation: Secondary | ICD-10-CM | POA: Diagnosis not present

## 2014-12-05 DIAGNOSIS — I472 Ventricular tachycardia, unspecified: Secondary | ICD-10-CM

## 2014-12-05 DIAGNOSIS — I5022 Chronic systolic (congestive) heart failure: Secondary | ICD-10-CM | POA: Diagnosis not present

## 2014-12-05 NOTE — Patient Instructions (Addendum)
Medication Instructions:  Your physician recommends that you continue on your current medications as directed. Please refer to the Current Medication list given to you today.   Labwork: None ordered   Testing/Procedures: None ordered   Follow-Up: Remote monitoring is used to monitor your ICD from home. This monitoring reduces the number of office visits required to check your device to one time per year. It allows [place] to keep an eye on the functioning of your device to ensure it is working properly. You are scheduled for a device check from home on 03/06/15. You may send your transmission at any time that day. If you have a wireless device, the transmission will be sent automatically. After your physician reviews your transmission, you will receive a postcard with your next transmission date.  Your physician wants you to follow-up in: 12 months in [place] with Dr Vallery Ridge will receive a reminder letter in the mail two months in advance. If you don't receive a letter, please call our office to schedule the follow-up appointment.    Any Other Special Instructions Will Be Listed Below (If Applicable).     If you need a refill on your cardiac medications before your next appointment, please call your pharmacy.

## 2014-12-05 NOTE — Progress Notes (Signed)
Electrophysiology Office Note   Date:  12/05/2014   ID:  Melissa Paul, DOB April 04, 1945, MRN [id number]  PCP:  Deloria Lair, MD  Cardiologist:  Dr Domenic Polite Primary Electrophysiologist: Thompson Grayer, MD    Chief Complaint  Patient presents with  . Chronic systolic heart failure  . PAF  . Ventricular tachyarrythmia     History of Present Illness: Melissa Paul is a 69 y.o. female who presents today for electrophysiology evaluation.   She is doing reasonably well.  She has had no further ventricular arrhythmias.  She has had afib for which she is unaware.  She declines anticoagulation.  Her primary concern is with DJD. Today, she denies symptoms of palpitations, chest pain, shortness of breath, orthopnea, PND, claudication, dizziness, presyncope, syncope, bleeding, or neurologic sequela. The patient is tolerating medications without difficulties and is otherwise without complaint today.    Past Medical History  Diagnosis Date  . Nonischemic cardiomyopathy ([place])     LVEF 35-40% by echo 8/13  . Chronic systolic heart failure ([place])   . LBBB (left bundle branch block)   . Hypothyroidism   . UTI (urinary tract infection)     Recurrent  . Glucose intolerance (pre-diabetes)   . Hyperlipemia   . Mitral regurgitation     Mild to Moderate  . Paroxysmal atrial fibrillation (H[facility])     Brief episodes by device interrogation  . Ventricular tachycardia ([place])     ICD therapy for VT   Past Surgical History  Procedure Laterality Date  . Cholecystectomy    . Total abdominal hysterectomy      (R) ovary removed  . Appendectomy    . Knee arthroscopy      Left  . Cardiac defibrillator placement  09/2009    St. Jude BiV ICD by Dr. Sandria Manly class III  . Left and right heart catheterization with coronary angiogram N/A 04/12/2014    Procedure: LEFT AND RIGHT HEART CATHETERIZATION WITH CORONARY ANGIOGRAM;  Surgeon: Leonie Man, MD;  Location: [facility] CATH LAB;  Service: Cardiovascular;   Laterality: N/A;     Current Outpatient Prescriptions  Medication Sig Dispense Refill  . aspirin 81 MG tablet Take 1 tablet (81 mg total) by mouth daily. 30 tablet   . carvedilol (COREG) 12.5 MG tablet TAKE ONE TABLET BY MOUTH TWICE DAILY 180 tablet 3  . cetirizine (ZYRTEC) 10 MG tablet Take 10 mg by mouth daily as needed for allergies or rhinitis.     Marland Kitchen diazepam (VALIUM) 5 MG tablet Take one tablet every morning per patient    . digoxin (LANOXIN) 0.125 MG tablet Take 1 tablet by mouth daily.     . furosemide (LASIX) 80 MG tablet Take 1 tablet (80 mg total) by mouth daily. May take an additional 1/2 tablet (40 mg) as needed for weight gain of 3 lbs in a 24 hour period 45 tablet 6  . levothyroxine (SYNTHROID, LEVOTHROID) 75 MCG tablet Take 37.5 mcg by mouth daily.     Marland Kitchen losartan (COZAAR) 25 MG tablet Take 1/2 tab (12.5mg ) by mouth daily 15 tablet 6  . metFORMIN (GLUCOPHAGE) 500 MG tablet Take 500 mg by mouth 2 (two) times daily.    . ondansetron (ZOFRAN) 4 MG tablet Take 4 mg by mouth every 8 (eight) hours as needed for nausea or vomiting.    . ranitidine (ZANTAC) 150 MG capsule Take 150 mg by mouth daily.     Marland Kitchen spironolactone (ALDACTONE) 25 MG tablet Take 25 mg by  mouth daily.      No current facility-administered medications for this visit.    Allergies:   Bactrim; Cephalexin; Clindamycin; Contrast media; Latex; Levofloxacin; Nitrofurantoin monohyd macro; and Penicillins   Social History:  The patient  reports that she quit smoking about 18 years ago. Her smoking use included Cigarettes. She started smoking about 65 years ago. She has a 6 pack-year smoking history. She has never used smokeless tobacco. She reports that she does not drink alcohol or use illicit drugs.   Family History:  The patient's family history includes CVA in her mother; Heart Problems in her mother; Heart disease in her maternal grandfather and maternal grandmother. There is no history of Diabetes, Hypertension, or  Coronary artery disease.    ROS:  Please see the history of present illness.   All other systems are reviewed and negative.    PHYSICAL EXAM: VS:  BP 92/80 mmHg  Pulse 68  Ht 5\' 4"  (1.626 m)  Wt 192 lb 6.4 oz (87.272 kg)  BMI 33.01 kg/m2 , BMI Body mass index is 33.01 kg/(m^2). GEN: Well nourished, well developed, in no acute distress HEENT: very poor dentition Neck: mildly elevated JVP, carotid bruits, or masses Cardiac: RRR; no murmurs, rubs, or gallops,trace edema  Respiratory:  clear to auscultation bilaterally, normal work of breathing GI: soft, nontender, nondistended, + BS MS: no deformity or atrophy Skin: warm and dry, device pocket is well healed Neuro:  Strength and sensation are intact Psych: euthymic mood, full affect  Device interrogation is reviewed today in detail.  See PaceArt for details.   Recent Labs: 03/16/2014: Magnesium 2.2; TSH 3.22 06/08/2014: BUN 14; Creatinine, Ser 0.78; Hemoglobin 13.2; Platelets 286.0; Potassium 4.2; Sodium 136    Lipid Panel     Component Value Date/Time   CHOL  04/11/2007 0600    139        ATP III CLASSIFICATION:  <200     mg/dL   Desirable  200-239  mg/dL   Borderline High  >=240    mg/dL   High   TRIG 51 04/11/2007 0600   HDL 42 04/11/2007 0600   CHOLHDL 3.3 04/11/2007 0600   VLDL 10 04/11/2007 0600   LDLCALC  04/11/2007 0600    87        Total Cholesterol/HDL:CHD Risk Coronary Heart Disease Risk Table                     Men   Women  1/2 Average Risk   3.4   3.3     Wt Readings from Last 3 Encounters:  12/05/14 192 lb 6.4 oz (87.272 kg)  11/25/14 191 lb 6.4 oz (86.818 kg)  08/25/14 194 lb (87.998 kg)    Dr Chuck Hint notes are reviewed  ASSESSMENT AND PLAN:  1.  VF Controlled, without recurrence Normal ICD function See Pace Art report No changes today  I have discussed SJM Fortify Assura advisary with the patient today. She understands that recommendation from SJM is to not replace the device at this  time. The patient is not device dependant.  The patient has had appropriate device therapy in the past or implanted for secondary prevention.  Vibratory alert demonstrated today.  She is actively remotely monitored and understands the importance of compliance today.  At this time, she would like to avoid device replacement.  We will follow remotely at this time.  2. afib asymptomatic chads2vasc score is at least 3.  She is clear that she  will not start anticoagulation at this time. Avoid AADs unless burden increases  3. chronic systolic dysfunction euvolemic today 2 gram sodium restriction advised  4. Poor dentition Very poor dentition Risks of device infection increase were discussed today.  I have strongly encouraged her to see a dentist to arrange for appropriate management  Merlin Return to see me in [place] in 12 months  Current medicines are reviewed at length with the patient today.   The patient does not have concerns regarding her medicines.  The following changes were made today:  none  Signed, Thompson Grayer, MD  12/05/2014 11:25 AM     [place] [address] [phone] (office) [phone] (fax)

## 2014-12-07 ENCOUNTER — Other Ambulatory Visit: Payer: Self-pay | Admitting: *Deleted

## 2014-12-07 MED ORDER — LOSARTAN POTASSIUM 25 MG PO TABS: ORAL_TABLET | ORAL | Status: DC

## 2015-01-03 ENCOUNTER — Ambulatory Visit (INDEPENDENT_AMBULATORY_CARE_PROVIDER_SITE_OTHER): Payer: Medicare Other

## 2015-01-03 DIAGNOSIS — Z95810 Presence of automatic (implantable) cardiac defibrillator: Secondary | ICD-10-CM | POA: Diagnosis not present

## 2015-01-03 DIAGNOSIS — I5023 Acute on chronic systolic (congestive) heart failure: Secondary | ICD-10-CM | POA: Diagnosis not present

## 2015-01-04 NOTE — Progress Notes (Addendum)
EPIC Encounter for ICM Monitoring  Patient Name: Melissa Paul is a 69 y.o. female Date: 01/04/2015 Primary Care Physican: Deloria Lair, MD Primary Cardiologist: Domenic Polite Electrophysiologist: Allred Dry Weight: 190 lbs  Bi-V Pacing 93%       In the past month, have you:  1. Gained more than 2 pounds in a day or more than 5 pounds in a week? Yes, 3 pounds overnight, 12/11 to 12/12.  2. Had changes in your medications (with verification of current medications)? Yes, Metformin started 10/22/2014.   3. Had more shortness of breath than is usual for you? no  4. Limited your activity because of shortness of breath? no  5. Not been able to sleep because of shortness of breath? no  6. Had increased swelling in your feet or ankles? Yes  7. Had symptoms of dehydration (dizziness, dry mouth, increased thirst, decreased urine output) no  8. Had changes in sodium restriction? no  9. Been compliant with medication? Yes   ICM trend: 1 year daily impedance view         3 month daily impedance view    Follow-up plan: ICM clinic phone appointment 02/06/2015.  [place] daily impedance below baseline ~12/05/2014 to 12/13/2014, 12/16/2014 to 12/19/2014, 12/24/2014 to 12/26/2014, 01/01/2015 to 01/02/2015 suggesting fluid retention.  Impedance returned to baseline on 01/03/2015.  She reported weight gain and leg swelling 01/01/2015 to 01/02/2015 and took extra 1/2 tablet of Furosemide as prescribed by Dr Domenic Polite and symptoms resolved.  No changes today.    Copy of note sent to patient's primary care physician, primary cardiologist, and device following physician.  [name], RN, CCM 01/04/2015 10:49 AM

## 2015-02-06 ENCOUNTER — Ambulatory Visit (INDEPENDENT_AMBULATORY_CARE_PROVIDER_SITE_OTHER): Payer: Medicare Other

## 2015-02-06 ENCOUNTER — Telehealth: Payer: Self-pay | Admitting: Cardiology

## 2015-02-06 DIAGNOSIS — I5023 Acute on chronic systolic (congestive) heart failure: Secondary | ICD-10-CM | POA: Diagnosis not present

## 2015-02-06 DIAGNOSIS — Z95810 Presence of automatic (implantable) cardiac defibrillator: Secondary | ICD-10-CM | POA: Diagnosis not present

## 2015-02-06 NOTE — Telephone Encounter (Signed)
Spoke with pt and reminded pt of remote transmission that is due today. Pt verbalized understanding.   

## 2015-02-07 NOTE — Progress Notes (Signed)
EPIC Encounter for ICM Monitoring  Patient Name: Melissa Paul is a 70 y.o. female Date: 02/07/2015 Primary Care Physican: Deloria Lair, MD Primary Cardiologist: Domenic Polite Electrophysiologist: Allred Dry Weight: 184 lbs  Bi-V Pacing 97%       In the past month, have you:  1. Gained more than 2 pounds in a day or more than 5 pounds in a week? no  2. Had changes in your medications (with verification of current medications)? no  3. Had more shortness of breath than is usual for you? no  4. Limited your activity because of shortness of breath? no  5. Not been able to sleep because of shortness of breath? no  6. Had increased swelling in your feet or ankles? Yes, swelling in legs and fingers/hands  7. Had symptoms of dehydration (dizziness, dry mouth, increased thirst, decreased urine output) no  8. Had changes in sodium restriction? no  9. Been compliant with medication? Yes   ICM trend: 3 month view 02/06/2015  ICM trend: 1 year view 02/06/2015   Follow-up plan: ICM clinic phone appointment 03/10/2015 and has office appointment with Dr Domenic Polite on 03/01/2015.   CORVUE:  Daily Thoracic Impedance below reference line 01/29/2015 to 02/01/2015 suggesting fluid retention and she reported some leg swelling.   Daily Thoracic Impedance above baseline starting 01/29/2015 suggesting dryness but is not correlating with patient symptoms today.    SYMPTOMS: Today she is experiencing leg and finger/hand swelling.   She stated her legs feel tight and difficult to bend her fingers.  She plans on taking extra Furosemide 40 mg tablet today as prescribed for edema.  She reported she is not gaining weight and has lost weight approximately 10 lbs in last month.    MEDS: No changes - Patient complaining of severe heartburn and acid reflux.  She started Metformin in October or November 2016.  Advised the Metformin has GI side effects and if her symptoms have worsened its possible it could be related.   Advised to call Dr Scotty Court to discuss GI symptoms.   DIET:  Discussed her diet and types of foods that have high sodium levels.  She stated they do eat fresh fruit, vegetables and baked chicken or beef. She stated she does not eat junk food and does not think the leg and hand swelling today are related to her diet.   Patient reported she will contact Dr McDowell's office tomorrow if the extra 40 mg Furosemide dosage today does not improve the leg and hand swelling.    Copy of note sent to patient's primary care physician, primary cardiologist, and device following physician.  [name], RN, CCM 02/07/2015 10:14 AM

## 2015-03-01 ENCOUNTER — Ambulatory Visit (INDEPENDENT_AMBULATORY_CARE_PROVIDER_SITE_OTHER): Payer: Medicare Other | Admitting: Cardiology

## 2015-03-01 ENCOUNTER — Encounter: Payer: Self-pay | Admitting: Cardiology

## 2015-03-01 VITALS — BP 96/56 | HR 73 | Ht 64.0 in | Wt 189.0 lb

## 2015-03-01 DIAGNOSIS — I428 Other cardiomyopathies: Secondary | ICD-10-CM

## 2015-03-01 DIAGNOSIS — I429 Cardiomyopathy, unspecified: Secondary | ICD-10-CM

## 2015-03-01 DIAGNOSIS — Z95810 Presence of automatic (implantable) cardiac defibrillator: Secondary | ICD-10-CM

## 2015-03-01 DIAGNOSIS — I5022 Chronic systolic (congestive) heart failure: Secondary | ICD-10-CM

## 2015-03-01 NOTE — Progress Notes (Signed)
Cardiology Office Note  Date: 03/01/2015   ID: Melissa Paul, DOB 1945/02/20, MRN [id number]  PCP: Deloria Lair, MD  Primary Cardiologist: Rozann Lesches, MD   Chief Complaint  Patient presents with  . Nonischemic cardiomyopathy  . Chronic systolic heart failure    History of Present Illness: Melissa Paul is a 70 y.o. female last seen in November 2016.  She is here today for a routine follow-up visit. She reports that overall there has been no significant change in stamina. She has intermittent swelling in her legs and reports that she uses an extra dose of Lasix 40 mg anywhere from once a week to once every 2-3 weeks. She otherwise reports compliance with her medications.  She continues to follow with Dr. [name] status post placement of St. Jude biventricular ICD.  She has had no device shocks and continues with remote transmissions. Fluctuation in Optivol noted.  Her weight is down 4 pounds from the last visit.  She reports pending follow-up with Dr. Scotty Court for review of her glucose control.  Past Medical History  Diagnosis Date  . Nonischemic cardiomyopathy ([place])     LVEF 35-40% by echo 8/13  . Chronic systolic heart failure ([place])   . LBBB (left bundle branch block)   . Hypothyroidism   . UTI (urinary tract infection)     Recurrent  . Glucose intolerance (pre-diabetes)   . Hyperlipemia   . Mitral regurgitation     Mild to Moderate  . Paroxysmal atrial fibrillation ([facility])     Brief episodes by device interrogation  . Ventricular tachycardia ([place])     ICD therapy for VT    Current Outpatient Prescriptions  Medication Sig Dispense Refill  . aspirin 81 MG tablet Take 1 tablet (81 mg total) by mouth daily. 30 tablet   . carvedilol (COREG) 12.5 MG tablet TAKE ONE TABLET BY MOUTH TWICE DAILY 180 tablet 3  . cetirizine (ZYRTEC) 10 MG tablet Take 10 mg by mouth daily as needed for allergies or rhinitis.     Marland Kitchen diazepam (VALIUM) 5 MG tablet Take one tablet every  morning per patient    . digoxin (LANOXIN) 0.125 MG tablet Take 1 tablet by mouth daily.     . furosemide (LASIX) 80 MG tablet Take 1 tablet (80 mg total) by mouth daily. May take an additional 1/2 tablet (40 mg) as needed for weight gain of 3 lbs in a 24 hour period 45 tablet 6  . levothyroxine (SYNTHROID, LEVOTHROID) 75 MCG tablet Take 37.5 mcg by mouth daily.     Marland Kitchen losartan (COZAAR) 25 MG tablet Take 1/2 tab (12.5mg ) by mouth daily 45 tablet 3  . metFORMIN (GLUCOPHAGE) 500 MG tablet Take 500 mg by mouth 2 (two) times daily.    . ondansetron (ZOFRAN) 4 MG tablet Take 4 mg by mouth every 8 (eight) hours as needed for nausea or vomiting.    . ranitidine (ZANTAC) 150 MG capsule Take 150 mg by mouth daily.     Marland Kitchen spironolactone (ALDACTONE) 25 MG tablet Take 25 mg by mouth daily.      No current facility-administered medications for this visit.   Allergies:  Bactrim; Cephalexin; Clindamycin; Contrast media; Latex; Levofloxacin; Nitrofurantoin monohyd macro; and Penicillins   Social History: The patient  reports that she quit smoking about 19 years ago. Her smoking use included Cigarettes. She started smoking about 65 years ago. She has a 6 pack-year smoking history. She has never used smokeless tobacco. She reports  that she does not drink alcohol or use illicit drugs.   ROS:  Please see the history of present illness. Otherwise, complete review of systems is positive for chronic leg pain and arthritis which limits her activity. All other systems are reviewed and negative.   Physical Exam: VS:  BP 96/56 mmHg  Pulse 73  Ht 5\' 4"  (1.626 m)  Wt 189 lb (85.73 kg)  BMI 32.43 kg/m2  SpO2 96%, BMI Body mass index is 32.43 kg/(m^2).  Wt Readings from Last 3 Encounters:  03/01/15 189 lb (85.73 kg)  12/05/14 192 lb 6.4 oz (87.272 kg)  11/25/14 191 lb 6.4 oz (86.818 kg)    Overweight woman, appears comfortable at rest.  HEENT: Conjunctiva and lids normal, oropharynx clear with poor dentition. Neck:  Supple, no elevated JVP, no carotid bruits, no thyromegaly.  Lungs: Clear to auscultation with decreased breath sounds, nonlabored breathing at rest.  Cardiac: Regular rate and rhythm, no S3 or significant systolic murmur, no pericardial rub.  Abdomen: Soft, nontender, bowel sounds present, no guarding or rebound.  Extremities: No significant pitting edema and adipose tissue, stasis.  ECG: ECG is not ordered today.  Recent Labwork: 03/16/2014: Magnesium 2.2; TSH 3.22 06/08/2014: BUN 14; Creatinine, Ser 0.78; Hemoglobin 13.2; Platelets 286.0; Potassium 4.2; Sodium 136   Other Studies Reviewed Today:  Echocardiogram 03/24/2014: Study Conclusions  - Left ventricle: Systolic function was moderately reduced. Estimated LVEF 40%. Global hypokinesis was seen. The cavity size was normal. Wall thickness was increased in a pattern of mild LVH. Features are consistent with a pseudonormal left ventricular filling pattern, with concomitant abnormal relaxation and increased filling pressure (grade 2 diastolic dysfunction). Doppler parameters are consistent with high ventricular filling pressure. - Left atrium: The atrium was mildly dilated. Volume/bsa, ES, (1-plane Simpson&'s, A2C): 30.3 ml/m^2. - Right ventricle: Pacer wire or catheter noted in right ventricle. Systolic function was mildly reduced. - Right atrium: The atrium was mildly dilated. Pacer wire or catheter noted in right atrium. - Tricuspid valve: There was mild regurgitation.  Assessment and Plan:  1. Chronic systolic heart failure. Overall stable clinically, weight is down 4 pounds. Plan to continue current medications and diuretic dose, adjusting Lasix as needed for weight gain which we have already discussed. Reinforced obtaining daily weights and following up for remote device transmissions.  2. Nonischemic cardiomyopathy with LVEF approximately 40% and grade 2 diastolic dysfunction.  3. St. Jude  biventricular ICD in place. Keep follow-up with Dr. [name].  Current medicines were reviewed with the patient today.  Disposition: FU with me in 3 months.   Signed, Satira Sark, MD, [facility] 03/01/2015 11:27 AM    [facility] [place], [place], [place] [address] Phone: [phone]; Fax: [phone]

## 2015-03-01 NOTE — Patient Instructions (Signed)
Your physician recommends that you continue on your current medications as directed. Please refer to the Current Medication list given to you today.  Your physician recommends that you schedule a follow-up appointment in: 3 months  

## 2015-03-10 ENCOUNTER — Ambulatory Visit (INDEPENDENT_AMBULATORY_CARE_PROVIDER_SITE_OTHER): Payer: Medicare Other

## 2015-03-10 DIAGNOSIS — I5022 Chronic systolic (congestive) heart failure: Secondary | ICD-10-CM | POA: Diagnosis not present

## 2015-03-10 DIAGNOSIS — Z95810 Presence of automatic (implantable) cardiac defibrillator: Secondary | ICD-10-CM | POA: Diagnosis not present

## 2015-03-13 NOTE — Progress Notes (Signed)
EPIC Encounter for ICM Monitoring  Patient Name: Melissa Paul is a 70 y.o. female Date: 03/13/2015 Primary Care Physican: Deloria Lair, MD Primary Cardiologist: Domenic Polite Electrophysiologist: Allred Dry Weight: 189 lbs  Bi-V Pacing 98%       In the past month, have you:  1. Gained more than 2 pounds in a day or more than 5 pounds in a week? Yes gained 6 lbs in the last 4 days and weight has decreased by 2 lbs since Saturday after taking extra 40 mg Lasix  2. Had changes in your medications (with verification of current medications)? no  3. Had more shortness of breath than is usual for you? no  4. Limited your activity because of shortness of breath? no  5. Not been able to sleep because of shortness of breath? no  6. Had increased swelling in your feet or ankles? Yes, hands and belly  7. Had symptoms of dehydration (dizziness, dry mouth, increased thirst, decreased urine output) no  8. Had changes in sodium restriction? no  9. Been compliant with medication? Yes   ICM trend: 3 month view for 03/10/2015   ICM trend: 1 year view for 03/10/2015   Follow-up plan: ICM clinic phone appointment on 03/17/2015.  03/10/2015 Corvue thoracic impedance below reference line 02/16/2015 to 02/27/2015 and 03/04/2015 to 03/07/2015 suggesting fluid accumulation.  She reported she has been having weight gain and hand/belly swelling over the weekend.  She reported having some stress in the last month and especially over the past weekend due to it was the 1 year anniversary of her husband's death.  She stated she had eating at restaurants over the weekend with her children which may have caused the fluid symptoms.   She took extra 40 mg Furosemide on 03/11/2015 but she has not dropped to her baseline weight.    Recommended she take extra 40 mg of Furosemide x 2 days and if she does not have improvement of fluid symptoms to call back.  Repeat ICM remote transmission on 03/17/2015.     Advised will send  for Dr Domenic Polite and Dr [name] to review and if any further recommendations will call her back.  Repeat transmission scheduled for 03/17/2015.  Copy of note sent to patient's primary care physician, primary cardiologist, and device following physician.  [name], RN, CCM 03/13/2015 8:56 AM

## 2015-03-17 ENCOUNTER — Ambulatory Visit (INDEPENDENT_AMBULATORY_CARE_PROVIDER_SITE_OTHER): Payer: Medicare Other

## 2015-03-17 DIAGNOSIS — I5022 Chronic systolic (congestive) heart failure: Secondary | ICD-10-CM

## 2015-03-17 DIAGNOSIS — Z95810 Presence of automatic (implantable) cardiac defibrillator: Secondary | ICD-10-CM

## 2015-03-17 NOTE — Progress Notes (Signed)
EPIC Encounter for ICM Monitoring  Patient Name: Melissa Paul is a 70 y.o. female Date: 03/17/2015 Primary Care Physican: Deloria Lair, MD Primary Cardiologist: Domenic Polite Electrophysiologist: Allred Dry Weight: 184 lbs  Bi-V Pacing 97%       In the past month, have you:  1. Gained more than 2 pounds in a day or more than 5 pounds in a week? no  2. Had changes in your medications (with verification of current medications)? no  3. Had more shortness of breath than is usual for you? no  4. Limited your activity because of shortness of breath? no  5. Not been able to sleep because of shortness of breath? no  6. Had increased swelling in your feet or ankles? no  7. Had symptoms of dehydration (dizziness, dry mouth, increased thirst, decreased urine output) no  8. Had changes in sodium restriction? no  9. Been compliant with medication? Yes   ICM trend: 3 month view for 03/17/2015   ICM trend: 1 year view for 03/17/2015   Follow-up plan: ICM clinic phone appointment on 04/18/2015.  Corvue thoracic impedance returned back to reference line after taking extra Furosemide as recommended on 03/13/2015.   She stated fluid symptoms have resolved and she is feeling better.  No complaints today.  Encouraged to call for any fluid symptoms.  No changes today.    Copy of note sent to patient's primary care physician, primary cardiologist, and device following physician.  [name], RN, CCM 03/17/2015 11:21 AM

## 2015-03-27 DIAGNOSIS — L409 Psoriasis, unspecified: Secondary | ICD-10-CM | POA: Insufficient documentation

## 2015-04-11 ENCOUNTER — Ambulatory Visit (INDEPENDENT_AMBULATORY_CARE_PROVIDER_SITE_OTHER): Payer: Medicare Other | Admitting: *Deleted

## 2015-04-11 ENCOUNTER — Encounter: Payer: Self-pay | Admitting: Internal Medicine

## 2015-04-11 DIAGNOSIS — Z95810 Presence of automatic (implantable) cardiac defibrillator: Secondary | ICD-10-CM

## 2015-04-11 DIAGNOSIS — I429 Cardiomyopathy, unspecified: Secondary | ICD-10-CM

## 2015-04-11 DIAGNOSIS — I428 Other cardiomyopathies: Secondary | ICD-10-CM

## 2015-04-14 NOTE — Progress Notes (Signed)
Remote ICD transmission.   

## 2015-04-18 ENCOUNTER — Ambulatory Visit (INDEPENDENT_AMBULATORY_CARE_PROVIDER_SITE_OTHER): Payer: Medicare Other

## 2015-04-18 DIAGNOSIS — I5022 Chronic systolic (congestive) heart failure: Secondary | ICD-10-CM | POA: Diagnosis not present

## 2015-04-18 DIAGNOSIS — Z95810 Presence of automatic (implantable) cardiac defibrillator: Secondary | ICD-10-CM

## 2015-04-19 NOTE — Progress Notes (Signed)
EPIC Encounter for ICM Monitoring  Patient Name: Melissa Paul is a 70 y.o. female Date: 04/19/2015 Primary Care Physican: Deloria Lair, MD Primary Cardiologist: Domenic Polite Electrophysiologist: Allred Dry Weight: 187 lbs   Bi-V Pacing 98%      In the past month, have you:  1. Gained more than 2 pounds in a day or more than 5 pounds in a week? Yes, she was approximately 183 lbs and did get as high as 190 lbs during the times that impedance was below reference line.  She is still about 3 lbs higher than base line weight.    2. Had changes in your medications (with verification of current medications)? no  3. Had more shortness of breath than is usual for you? no  4. Limited your activity because of shortness of breath? no  5. Not been able to sleep because of shortness of breath? no  6. Had increased swelling in your feet or ankles? no  7. Had symptoms of dehydration (dizziness, dry mouth, increased thirst, decreased urine output) no  8. Had changes in sodium restriction? no  9. Been compliant with medication? Yes   ICM trend: 3 month view for 04/19/2015  ICM trend: 1 year view for 04/19/2015   Follow-up plan: ICM clinic phone appointment on 05/29/2015.  Thoracic impedance below above reference line from 03/31/2015 to 04/03/2015 and 04/09/2015 to 04/14/2015 suggesting fluid accumulation.  Patient had symptoms of weight gain, fatigue and tightness in chest/legs during those times.   She stated the only symptom she has left is weight gain.  She stated she is still about 3 lbs above her baseline.   She took extra Furosemide as prescribed on 04/02/2015 and 04/13/2015.  Thoracic impedance returned to reference line 04/14/2015.  Education given to limit sodium intake to < 2000 mg and fluid intake to 64 oz daily.  Encouraged to call for any fluid symptoms.  No changes today.    Appointment with Dr Domenic Polite on 05/30/2015.  Patient is taking extra Furosemide when needed for symptoms.   Copy of  note sent to patient's primary care physician, primary cardiologist, and device following physician.  [name], RN, CCM 04/19/2015 10:20 AM

## 2015-05-23 LAB — CUP PACEART REMOTE DEVICE CHECK
Battery Remaining Longevity: 17 mo
Battery Remaining Percentage: 23 %
Battery Voltage: 2.8 V
Brady Statistic AP VP Percent: 86 %
Brady Statistic AP VS Percent: 1 %
Brady Statistic AS VP Percent: 12 %
Brady Statistic AS VS Percent: 1 %
Brady Statistic RA Percent Paced: 85 %
Date Time Interrogation Session: 20170321204525
HighPow Impedance: 80 Ohm
HighPow Impedance: 80 Ohm
Implantable Lead Implant Date: 20110912
Implantable Lead Implant Date: 20110912
Implantable Lead Implant Date: 20120710
Implantable Lead Location: 753858
Implantable Lead Location: 753859
Implantable Lead Location: 753860
Implantable Lead Model: 7122
Lead Channel Impedance Value: 360 Ohm
Lead Channel Impedance Value: 490 Ohm
Lead Channel Impedance Value: 490 Ohm
Lead Channel Pacing Threshold Amplitude: 0.5 V
Lead Channel Pacing Threshold Amplitude: 0.625 V
Lead Channel Pacing Threshold Pulse Width: 0.5 ms
Lead Channel Pacing Threshold Pulse Width: 0.5 ms
Lead Channel Sensing Intrinsic Amplitude: 12 mV
Lead Channel Sensing Intrinsic Amplitude: 3.4 mV
Lead Channel Setting Pacing Amplitude: 2 V
Lead Channel Setting Pacing Amplitude: 2 V
Lead Channel Setting Pacing Amplitude: 2.625
Lead Channel Setting Pacing Pulse Width: 0.5 ms
Lead Channel Setting Pacing Pulse Width: 0.5 ms
Lead Channel Setting Sensing Sensitivity: 0.5 mV
Pulse Gen Serial Number: 794739

## 2015-05-29 ENCOUNTER — Ambulatory Visit (INDEPENDENT_AMBULATORY_CARE_PROVIDER_SITE_OTHER): Payer: Medicare Other

## 2015-05-29 DIAGNOSIS — I5022 Chronic systolic (congestive) heart failure: Secondary | ICD-10-CM | POA: Diagnosis not present

## 2015-05-29 DIAGNOSIS — Z95810 Presence of automatic (implantable) cardiac defibrillator: Secondary | ICD-10-CM

## 2015-05-29 NOTE — Progress Notes (Signed)
EPIC Encounter for ICM Monitoring  Patient Name: Melissa Paul is a 70 y.o. female Date: 05/29/2015 Primary Care Physican: Deloria Lair, MD Primary Cardiologist: Domenic Polite Electrophysiologist: Allred Dry Weight: 193.1 lbs    Bi-V Pacing 98%      In the past month, have you:  1. Gained more than 2 pounds in a day or more than 5 pounds in a week? Yes.  Baseline weight is 183 lbs and weight has increased up to 195.9 within the last 2 weeks.    2. Had changes in your medications (with verification of current medications)? no  3. Had more shortness of breath than is usual for you? Yes  4. Limited your activity because of shortness of breath? Yes  5. Not been able to sleep because of shortness of breath? no  6. Had increased swelling in your feet, ankles, legs or stomach area? Yes, legs have been very swollen and described that her hand print leaves an indention.  She has some improvement when taking extra Furosemide.   7. Had symptoms of dehydration (dizziness, dry mouth, increased thirst, decreased urine output) no  8. Had changes in sodium restriction? no  9. Been compliant with medication? Yes  ICM trend: 3 month view for 05/29/2015   ICM trend: 1 year view for 05/29/2015    Direct Trend Viewer since end of March.     Follow-up plan: ICM clinic phone appointment 06/07/2015.  Appointment with Dr Domenic Polite 05/29/2015.    FLUID LEVELS:  Corvue thoracic impedance decreased 05/08/2015 to 05/12/2015, 05/15/2015 to 05/20/2015 suggesting fluid accumulation.  Impedance above reference line 05/20/2015 which correlates with taking extra Furosemide for fluid symptoms.  Thoracic impedance is starting to trend below baseline today.   SYMPTOMS:   She reported weight gain, SOB and lower extremity swelling.  She stated she has not been at baseline weight since approximately March.  She has been taking extra Furosemide which does help improve symptoms but never completely resolves them.    Corvue  impedance suggests dryness from 05/20/2015 to 05/28/2015 which does not seem to correlate since she is having fluid symptoms during that time period.   She has an appointment with Dr Domenic Polite tomorrow to discuss symptoms and how often she needs lab work done.       Advised will send to PCP, primary cardiologist and device following physician for review.  Explained Dr Domenic Polite will make recommendations at the office appointment tomorrow if needed.  Repeat transmission on 06/07/2015.      [name], RN, CCM 05/29/2015 11:35 AM

## 2015-05-30 ENCOUNTER — Encounter: Payer: Self-pay | Admitting: Cardiology

## 2015-05-30 ENCOUNTER — Ambulatory Visit (INDEPENDENT_AMBULATORY_CARE_PROVIDER_SITE_OTHER): Payer: Medicare Other | Admitting: Cardiology

## 2015-05-30 VITALS — BP 110/64 | HR 66 | Ht 64.0 in | Wt 194.0 lb

## 2015-05-30 DIAGNOSIS — I5043 Acute on chronic combined systolic (congestive) and diastolic (congestive) heart failure: Secondary | ICD-10-CM

## 2015-05-30 DIAGNOSIS — I428 Other cardiomyopathies: Secondary | ICD-10-CM

## 2015-05-30 DIAGNOSIS — I472 Ventricular tachycardia, unspecified: Secondary | ICD-10-CM

## 2015-05-30 DIAGNOSIS — I429 Cardiomyopathy, unspecified: Secondary | ICD-10-CM

## 2015-05-30 DIAGNOSIS — Z95810 Presence of automatic (implantable) cardiac defibrillator: Secondary | ICD-10-CM

## 2015-05-30 MED ORDER — FUROSEMIDE 80 MG PO TABS: 40.0000 mg | ORAL_TABLET | ORAL | Status: DC

## 2015-05-30 NOTE — Progress Notes (Signed)
Cardiology Office Note  Date: 05/30/2015   ID: ZAMIYAH SHU, DOB 1945-02-22, MRN [id number]  PCP: Deloria Lair, MD  Primary Cardiologist: Rozann Lesches, MD   Chief Complaint  Patient presents with  . Cardiomyopathy    History of Present Illness: Melissa Paul is a 70 y.o. female last seen in February.She is here today with her daughter for a follow-up visit. She complains of having intermittent leg swelling and also increased shortness of breath with chest discomfort. She continues to follow with remote device interrogations. She has had fluctuations in thoracic impedance, I reviewed the recent chart notes.  We went over her medications. She has been taking Lasix 80 mg daily, using an additional 40 mg at least twice a week. We talked about advancing this to every other day standing dose and possibly every day if needed. Her weight is up 5 pounds compared to February.  She has not had recent lab work. Creatinine was normal as of October 2016 per [facility] records. She has not seen Dr. Scotty Court recently.  She has not had any recent device shocks or syncope.  Past Medical History  Diagnosis Date  . Nonischemic cardiomyopathy ([place])     LVEF 35-40% by echo 8/13  . Chronic systolic heart failure ([place])   . LBBB (left bundle branch block)   . Hypothyroidism   . UTI (urinary tract infection)     Recurrent  . Glucose intolerance (pre-diabetes)   . Hyperlipemia   . Mitral regurgitation     Mild to Moderate  . Paroxysmal atrial fibrillation ([facility])     Brief episodes by device interrogation  . Ventricular tachycardia ([place])     ICD therapy for VT    Past Surgical History  Procedure Laterality Date  . Cholecystectomy    . Total abdominal hysterectomy      (R) ovary removed  . Appendectomy    . Knee arthroscopy      Left  . Cardiac defibrillator placement  09/2009    St. Jude BiV ICD by Dr. Sandria Manly class III  . Left and right heart catheterization with coronary  angiogram N/A 04/12/2014    Procedure: LEFT AND RIGHT HEART CATHETERIZATION WITH CORONARY ANGIOGRAM;  Surgeon: Leonie Man, MD;  Location: [facility] CATH LAB;  Service: Cardiovascular;  Laterality: N/A;    Current Outpatient Prescriptions  Medication Sig Dispense Refill  . aspirin 81 MG tablet Take 1 tablet (81 mg total) by mouth daily. 30 tablet   . carvedilol (COREG) 12.5 MG tablet TAKE ONE TABLET BY MOUTH TWICE DAILY 180 tablet 3  . cetirizine (ZYRTEC) 10 MG tablet Take 10 mg by mouth daily as needed for allergies or rhinitis.     Marland Kitchen diazepam (VALIUM) 5 MG tablet Take one tablet every morning per patient    . digoxin (LANOXIN) 0.125 MG tablet Take 1 tablet by mouth daily.     . furosemide (LASIX) 80 MG tablet Take 0.5-1 tablets (40-80 mg total) by mouth every other day. Take Alternate 80 mg in the morning and 40 mg in the evening every other day 45 tablet 6  . levothyroxine (SYNTHROID, LEVOTHROID) 75 MCG tablet Take 37.5 mcg by mouth daily.     Marland Kitchen losartan (COZAAR) 25 MG tablet Take 1/2 tab (12.5mg ) by mouth daily 45 tablet 3  . metFORMIN (GLUCOPHAGE) 500 MG tablet Take 500 mg by mouth 2 (two) times daily.    . ondansetron (ZOFRAN) 4 MG tablet Take 4 mg by mouth every 8 (  eight) hours as needed for nausea or vomiting.    . ranitidine (ZANTAC) 150 MG capsule Take 150 mg by mouth daily.     Marland Kitchen spironolactone (ALDACTONE) 25 MG tablet Take 25 mg by mouth daily.      No current facility-administered medications for this visit.   Allergies:  Bactrim; Cephalexin; Clindamycin; Contrast media; Latex; Levofloxacin; Nitrofurantoin monohyd macro; and Penicillins   Social History: The patient  reports that she quit smoking about 19 years ago. Her smoking use included Cigarettes. She started smoking about 65 years ago. She has a 6 pack-year smoking history. She has never used smokeless tobacco. She reports that she does not drink alcohol or use illicit drugs.   ROS:  Please see the history of present illness.  Otherwise, complete review of systems is positive for fatigue.  All other systems are reviewed and negative.   Physical Exam: VS:  BP 110/64 mmHg  Pulse 66  Ht 5\' 4"  (1.626 m)  Wt 194 lb (87.998 kg)  BMI 33.28 kg/m2  SpO2 97%, BMI Body mass index is 33.28 kg/(m^2).  Wt Readings from Last 3 Encounters:  05/30/15 194 lb (87.998 kg)  03/01/15 189 lb (85.73 kg)  12/05/14 192 lb 6.4 oz (87.272 kg)    Overweight woman, appears comfortable at rest.  HEENT: Conjunctiva and lids normal, oropharynx clear with poor dentition. Neck: Supple, no elevated JVP, no carotid bruits, no thyromegaly.  Lungs: Clear to auscultation with decreased breath sounds, nonlabored breathing at rest.  Cardiac: Regular rate and rhythm, no S3 or significant systolic murmur, no pericardial rub.  Abdomen: Soft, nontender, bowel sounds present, no guarding or rebound.  Extremities: No significant pitting edema and adipose tissue, stasis.  ECG: I personally reviewed the prior tracing from 12/05/2014 which showed an atrial paced rhythm with nonspecific ST changes.  Recent Labwork: 06/08/2014: BUN 14; Creatinine, Ser 0.78; Hemoglobin 13.2; Platelets 286.0; Potassium 4.2; Sodium 136   Other Studies Reviewed Today:  Echocardiogram 03/24/2014: Study Conclusions  - Left ventricle: Systolic function was moderately reduced. Estimated LVEF 40%. Global hypokinesis was seen. The cavity size was normal. Wall thickness was increased in a pattern of mild LVH. Features are consistent with a pseudonormal left ventricular filling pattern, with concomitant abnormal relaxation and increased filling pressure (grade 2 diastolic dysfunction). Doppler parameters are consistent with high ventricular filling pressure. - Left atrium: The atrium was mildly dilated. Volume/bsa, ES, (1-plane Simpson&'s, A2C): 30.3 ml/m^2. - Right ventricle: Pacer wire or catheter noted in right ventricle. Systolic function was mildly  reduced. - Right atrium: The atrium was mildly dilated. Pacer wire or catheter noted in right atrium. - Tricuspid valve: There was mild regurgitation.  Assessment and Plan:  1. Acute on chronic combined heart failure. Plan is to increase Lasix to 80 mg daily with additional 40 mg added every other day. If this is not effective, we will increase to daily dosing. Follow-up BMET in 2 weeks.  2. Nonischemic cardiomyopathy with LVEF 40% and grade 2 diastolic dysfunction. She had normal coronary arteries at cardiac catheterization in 2016.  3. History of VT/VF, St. Jude biventricular ICD in place, followed by Dr. [name]. She continues on beta blocker therapy.  Current medicines were reviewed with the patient today.   Orders Placed This Encounter  Procedures  . Basic metabolic panel    Disposition: FU with me in 3 months.   Signed, Satira Sark, MD, [facility] 05/30/2015 11:47 AM    [facility]  [place] [address], [place], [place] [address] Phone: [phone]; Fax: [phone]

## 2015-05-30 NOTE — Patient Instructions (Signed)
Your physician has recommended you make the following change in your medication:  Change furosemide to alternating 80 mg in the morning and 40 mg in the evening every other day.  Continue all other medications the same. Your physician recommends that you return for lab work in 2 weeks around 06/13/15 to check your BMET. Your physician recommends that you schedule a follow-up appointment in: 3 months.

## 2015-05-31 ENCOUNTER — Encounter: Payer: Self-pay | Admitting: Cardiology

## 2015-06-07 ENCOUNTER — Ambulatory Visit (INDEPENDENT_AMBULATORY_CARE_PROVIDER_SITE_OTHER): Payer: Medicare Other

## 2015-06-07 DIAGNOSIS — I428 Other cardiomyopathies: Secondary | ICD-10-CM

## 2015-06-07 DIAGNOSIS — I429 Cardiomyopathy, unspecified: Secondary | ICD-10-CM

## 2015-06-07 DIAGNOSIS — I5043 Acute on chronic combined systolic (congestive) and diastolic (congestive) heart failure: Secondary | ICD-10-CM

## 2015-06-08 NOTE — Progress Notes (Signed)
EPIC Encounter for ICM Monitoring  Patient Name: Melissa Paul is a 70 y.o. female Date: 06/08/2015 Primary Care Physican: Deloria Lair, MD Primary Cardiologist: Domenic Polite Electrophysiologist: Allred Dry Weight: unknown   Bi-V Pacing 98%      In the past month, have you:  1. Gained more than 2 pounds in a day or more than 5 pounds in a week? N/A  2. Had changes in your medications (with verification of current medications)? Per Dr McDowell's note 05/30/2015, furosemide changed to alternating 80 mg in the morning and 40 mg in the evening every other day  3. Had more shortness of breath than is usual for you? N/A  4. Limited your activity because of shortness of breath? N/A  5. Not been able to sleep because of shortness of breath? N/A  6. Had increased swelling in your feet or ankles? N/A  7. Had symptoms of dehydration (dizziness, dry mouth, increased thirst, decreased urine output) N/A  8. Had changes in sodium restriction? N/A  9. Been compliant with medication? N/A   ICM trend: 3 month view for 06/07/2015   ICM trend: 1 year view for 06/07/2015   Follow-up plan: ICM clinic phone appointment on 07/11/2015.  Attempted call to patient and unable to reach.  Transmission reviewed.  Since 05/29/2015 ICM transmission, thoracic impedance is above baseline 06/01/2015 to 06/06/2015 suggesting dryness but back at baseline 06/07/2015 which correlates with increase in Furosemide per Dr McDowell's note on 05/30/2015.     Copy of note sent to Dr Domenic Polite with updated ICM transmission after increase in Furosemide.  [name], RN, CCM 06/08/2015 3:01 PM

## 2015-06-12 NOTE — Progress Notes (Signed)
Received call from patient.  She stated last week she was feeling good and fluid symptoms had resolved after visit with Dr Domenic Polite.  She reported this week she has had leg/feet swelling but is due to her brother passed away, her 2 granddaughters have both been hospitalized.  She stated it has been a bad week.  She described having some chest tightness but not unusual when she has fluid or stress.  She reported no change in the type of chest tightness she is having.  Encouraged her to use local emergency if the tightness changes.  She stated she is taking extra fluid pill which should relieve the swelling.  Reviewed transmission with her.  Next remote transmission scheduled for 07/11/2015.

## 2015-06-14 ENCOUNTER — Telehealth: Payer: Self-pay | Admitting: *Deleted

## 2015-06-14 NOTE — Telephone Encounter (Signed)
Patient informed. 

## 2015-06-14 NOTE — Telephone Encounter (Signed)
-----   Message from Satira Sark, MD sent at 06/13/2015  9:48 AM EDT ----- Reviewed. Creatinine and potassium are normal after recent increase in Lasix. Continue same for now.

## 2015-07-11 ENCOUNTER — Ambulatory Visit (INDEPENDENT_AMBULATORY_CARE_PROVIDER_SITE_OTHER): Payer: Medicare Other | Admitting: *Deleted

## 2015-07-11 DIAGNOSIS — I429 Cardiomyopathy, unspecified: Secondary | ICD-10-CM | POA: Diagnosis not present

## 2015-07-11 DIAGNOSIS — Z95810 Presence of automatic (implantable) cardiac defibrillator: Secondary | ICD-10-CM

## 2015-07-11 DIAGNOSIS — I428 Other cardiomyopathies: Secondary | ICD-10-CM

## 2015-07-11 NOTE — Progress Notes (Signed)
Remote ICD transmission.   

## 2015-07-11 NOTE — Progress Notes (Signed)
EPIC Encounter for ICM Monitoring  Patient Name: Melissa Paul is a 70 y.o. female Date: 07/11/2015 Primary Care Physican: Deloria Lair, MD Primary Cardiologist: Domenic Polite Electrophysiologist: Allred Dry Weight: 187 lbs   Bi-V Pacing 97%      In the past month, have you:  1. Gained more than 2 pounds in a day or more than 5 pounds in a week? Yes, 10 lb weight gain in last couple of weeks.  Highest weight was 197 lbs.  2. Had changes in your medications (with verification of current medications)? No  3. Had more shortness of breath than is usual for you? No   4. Limited your activity because of shortness of breath? No   5. Not been able to sleep because of shortness of breath? No   6. Had increased swelling in your feet, ankles, legs or stomach area? Yes, stomach, feet and hands.   7. Had symptoms of dehydration (dizziness, dry mouth, increased thirst, decreased urine output) No   8. Had changes in sodium restriction? No   9. Been compliant with medication? Yes   ICM trend: 3 month view for 07/11/2015   ICM trend: 1 year view for 07/11/2015   Follow-up plan: ICM clinic phone appointment 08/11/2015.    FLUID LEVELS: [place] thoracic impedance decreased 07/04/2015 to 07/08/2015 suggesting fluid accumulation.    SYMPTOMS:  Yes, she had swelling in her stomach, feet, hands and weight gain of 10 lbs over the last 2 weeks which correlates with decreased impedance.  She stated she has taken extra Furosemide last week due to symptoms.  She has resumed prescribed dosage of furosemide to alternating 80 mg in the morning and 40 mg in the evening every other day.  Hand, feet and stomach swelling have resolved. She reported weight has dropped from 197 lbs last week to 187 lbs today.  Baseline weight is 183 lbs.   EDUCATION: Limit sodium intake to < 2000 mg and fluid intake to 64 oz daily.     RECOMMENDATIONS: No changes today.  She is managing fluid symptoms by taking extra Lasix when  needed.   Advised will send to PCP, Dr. Domenic Polite and Dr. [name] for review regarding improved fluid symptoms but not at baseline weight yet.  Thoracic impedance returned to baseline 07/08/2015.  If any recommendations, will call back.    [name], RN, CCM 07/11/2015 10:42 AM

## 2015-07-12 LAB — CUP PACEART REMOTE DEVICE CHECK
Battery Remaining Longevity: 13 mo
Battery Remaining Percentage: 18 %
Battery Voltage: 2.75 V
Brady Statistic AP VP Percent: 87 %
Brady Statistic AP VS Percent: 1 %
Brady Statistic AS VP Percent: 11 %
Brady Statistic AS VS Percent: 1 %
Brady Statistic RA Percent Paced: 86 %
Date Time Interrogation Session: 20170620132807
HighPow Impedance: 73 Ohm
HighPow Impedance: 73 Ohm
Implantable Lead Implant Date: 20110912
Implantable Lead Implant Date: 20110912
Implantable Lead Implant Date: 20120710
Implantable Lead Location: 753858
Implantable Lead Location: 753859
Implantable Lead Location: 753860
Implantable Lead Model: 7122
Lead Channel Impedance Value: 360 Ohm
Lead Channel Impedance Value: 450 Ohm
Lead Channel Impedance Value: 480 Ohm
Lead Channel Pacing Threshold Amplitude: 0.5 V
Lead Channel Pacing Threshold Amplitude: 0.75 V
Lead Channel Pacing Threshold Pulse Width: 0.5 ms
Lead Channel Pacing Threshold Pulse Width: 0.5 ms
Lead Channel Sensing Intrinsic Amplitude: 11.8 mV
Lead Channel Sensing Intrinsic Amplitude: 4.3 mV
Lead Channel Setting Pacing Amplitude: 2 V
Lead Channel Setting Pacing Amplitude: 2 V
Lead Channel Setting Pacing Amplitude: 2.75 V
Lead Channel Setting Pacing Pulse Width: 0.5 ms
Lead Channel Setting Pacing Pulse Width: 0.5 ms
Lead Channel Setting Sensing Sensitivity: 0.5 mV
Pulse Gen Serial Number: 794739

## 2015-07-14 ENCOUNTER — Encounter: Payer: Self-pay | Admitting: Cardiology

## 2015-07-27 ENCOUNTER — Other Ambulatory Visit: Payer: Self-pay | Admitting: Internal Medicine

## 2015-08-01 ENCOUNTER — Other Ambulatory Visit: Payer: Self-pay | Admitting: *Deleted

## 2015-08-01 MED ORDER — FUROSEMIDE 80 MG PO TABS: 40.0000 mg | ORAL_TABLET | ORAL | Status: DC

## 2015-08-01 MED ORDER — FUROSEMIDE 80 MG PO TABS: ORAL_TABLET | ORAL | Status: DC

## 2015-08-11 ENCOUNTER — Ambulatory Visit (INDEPENDENT_AMBULATORY_CARE_PROVIDER_SITE_OTHER): Payer: Medicare Other

## 2015-08-11 DIAGNOSIS — Z95810 Presence of automatic (implantable) cardiac defibrillator: Secondary | ICD-10-CM | POA: Diagnosis not present

## 2015-08-11 NOTE — Progress Notes (Signed)
EPIC Encounter for ICM Monitoring  Patient Name: Melissa Paul is a 70 y.o. female Date: 08/11/2015 Primary Care Physican: Deloria Lair, MD Primary Cardiologist: Domenic Polite Electrophysiologist: Allred Dry Weight: 187 lb  Bi-V Pacing:  97%       Heart Failure questions reviewed, pt symptomatic of feelings chest fullness and tightness, weight gain 195.9 in the past week and has resolved after taking extra Lasix.  Two nights ago, she had chest pain that woke her up during the night but did not go to ER.  Advised this could be symptom of heart attack and she should still go to ER today to have that evaluated. She declined ER and said if it happens again she will go.  She stated she was worried about a stroke and when she didn't have stroke symptoms, she did not think she needed to go.  Advised to go to ER for any further chest pain.     Thoracic impedence decreased suggesting fluid accumulation 07/17/2015 to 07/22/2015 and 08/05/2015 to 08/09/2015.  RECOMMENDATIONS: No changes today. She is managing fluid symptoms by taking extra Lasix when needed.  She has taken Furosemide twice this week.    ICM trend: 08/11/2015     Follow-up plan: ICM clinic phone appointment on 09/01/2015. Office visit with Dr Domenic Polite 09/04/2015.  Copy of ICM check sent to primary cardiologist and device physician.   [name], RN 08/11/2015 9:03 AM

## 2015-09-01 ENCOUNTER — Ambulatory Visit (INDEPENDENT_AMBULATORY_CARE_PROVIDER_SITE_OTHER): Payer: Medicare Other

## 2015-09-01 ENCOUNTER — Telehealth: Payer: Self-pay

## 2015-09-01 DIAGNOSIS — Z95810 Presence of automatic (implantable) cardiac defibrillator: Secondary | ICD-10-CM

## 2015-09-01 DIAGNOSIS — I5022 Chronic systolic (congestive) heart failure: Secondary | ICD-10-CM

## 2015-09-01 NOTE — Telephone Encounter (Signed)
Spoke with patient and requested a ICM remote transmission.  She stated she would send one now.

## 2015-09-01 NOTE — Progress Notes (Signed)
EPIC Encounter for ICM Monitoring  Patient Name: Melissa Paul is a 70 y.o. female Date: 09/01/2015 Primary Care Physican: Deloria Lair, MD Primary Cardiologist: Domenic Polite Electrophysiologist: Allred Dry Weight: 187 lb  Bi-V Pacing:  97%       Heart Failure questions reviewed, pt symptomatic with swelling in her feet and ankles.  She has not taken any extra Furosemide for fluid symptoms because she stated the Furosemide is no longer really helping her. She will discuss with Dr Domenic Polite on 09/04/2015.  Weight stable.  Thoracic impedance returned to normal 08/28/2015.    Impedance below baseline 08/17/2015 to 08/20/2015, 08/22/2015 to 08/24/2015 and 08/27/2015 to 08/28/2015 suggesting fluid accumulation.  LABS: 06/12/2015 Creatinine 0.90, BUN 11, Potassium 4.1, Sodium 140   Recommendations:  Advise to take extra Furosemide for symptoms but she stated taking the extra medication does not work and will wait to see Dr Domenic Polite on Monday.  Will send copy to Dr Domenic Polite and Dr [name].   Patient gave permission to leave detailed message on phone.    ICM trend: 09/01/2015     Follow-up plan: ICM clinic phone appointment on 10/10/2015.  Copy of ICM check sent to primary cardiologist and device physician.   [name], RN 09/01/2015 9:34 AM

## 2015-09-04 ENCOUNTER — Ambulatory Visit (INDEPENDENT_AMBULATORY_CARE_PROVIDER_SITE_OTHER): Payer: Medicare Other | Admitting: Cardiology

## 2015-09-04 ENCOUNTER — Encounter: Payer: Self-pay | Admitting: Cardiology

## 2015-09-04 VITALS — BP 98/60 | HR 68 | Ht 64.0 in | Wt 192.0 lb

## 2015-09-04 DIAGNOSIS — I429 Cardiomyopathy, unspecified: Secondary | ICD-10-CM | POA: Diagnosis not present

## 2015-09-04 DIAGNOSIS — Z95810 Presence of automatic (implantable) cardiac defibrillator: Secondary | ICD-10-CM

## 2015-09-04 DIAGNOSIS — I340 Nonrheumatic mitral (valve) insufficiency: Secondary | ICD-10-CM

## 2015-09-04 DIAGNOSIS — I5042 Chronic combined systolic (congestive) and diastolic (congestive) heart failure: Secondary | ICD-10-CM

## 2015-09-04 DIAGNOSIS — I428 Other cardiomyopathies: Secondary | ICD-10-CM

## 2015-09-04 MED ORDER — METOLAZONE 2.5 MG PO TABS
2.5000 mg | ORAL_TABLET | ORAL | 2 refills | Status: DC
Start: 2015-09-04 — End: 2015-11-29

## 2015-09-04 NOTE — Progress Notes (Signed)
Cardiology Office Note  Date: 09/04/2015   ID: Melissa Paul, DOB 10/30/1945, MRN [id number]  PCP: Deloria Lair, MD  Primary Cardiologist: Rozann Lesches, MD   Chief Complaint  Patient presents with  . Chronic combined heart failure    History of Present Illness: Melissa Paul is a 70 y.o. female last seen in May.She is here today with her daughter for a follow-up visit. Reports intermittent feeling of swelling in her hands and legs. At the last visit we increased Lasix to 80 mg daily with an additional 40 mg every other day. She states this has been somewhat helpful but has not resolved her edema.  She continues to follow with Dr. [name] in the device clinic. Recent thoracic impedance measurements noted indicating intermittent fluid accumulation although return to baseline by August 7.  I reviewed her medications which are outlined below. She reports compliance with her Lasix, we discussed adding Zaroxolyn once or twice a week. She also tells me that she has been having orthostatic dizziness. I asked her to hold off on her losartan to see if this was helpful. Systolic blood pressure 98 today.  Past Medical History:  Diagnosis Date  . Chronic systolic heart failure ([place])   . Glucose intolerance (pre-diabetes)   . Hyperlipemia   . Hypothyroidism   . LBBB (left bundle branch block)   . Mitral regurgitation    Mild to Moderate  . Nonischemic cardiomyopathy ([place])    LVEF 35-40% by echo 8/13  . Paroxysmal atrial fibrillation ([facility])    Brief episodes by device interrogation  . UTI (urinary tract infection)    Recurrent  . Ventricular tachycardia ([place])    ICD therapy for VT    Past Surgical History:  Procedure Laterality Date  . APPENDECTOMY    . CARDIAC DEFIBRILLATOR PLACEMENT  09/2009   St. Jude BiV ICD by Dr. Sandria Manly class III  . CHOLECYSTECTOMY    . KNEE ARTHROSCOPY     Left  . LEFT AND RIGHT HEART CATHETERIZATION WITH CORONARY ANGIOGRAM N/A 04/12/2014   Procedure: LEFT AND RIGHT HEART CATHETERIZATION WITH CORONARY ANGIOGRAM;  Surgeon: Leonie Man, MD;  Location: [facility] C[facility] LAB;  Service: Cardiovascular;  Laterality: N/A;  . TOTAL ABDOMINAL HYSTERECTOMY     (R) ovary removed    Current Outpatient Prescriptions  Medication Sig Dispense Refill  . aspirin 81 MG tablet Take 1 tablet (81 mg total) by mouth daily. 30 tablet   . carvedilol (COREG) 12.5 MG tablet TAKE ONE TABLET BY MOUTH TWICE DAILY 180 tablet 3  . cetirizine (ZYRTEC) 10 MG tablet Take 10 mg by mouth daily as needed for allergies or rhinitis.     Marland Kitchen diazepam (VALIUM) 5 MG tablet Take one tablet every morning per patient    . digoxin (LANOXIN) 0.125 MG tablet Take 1 tablet by mouth daily.     . furosemide (LASIX) 80 MG tablet Take 80 mg in the morning and 40 mg in the evening every other day 45 tablet 6  . levothyroxine (SYNTHROID, LEVOTHROID) 75 MCG tablet Take 37.5 mcg by mouth daily.     . metFORMIN (GLUCOPHAGE) 500 MG tablet Take 500 mg by mouth 2 (two) times daily.    . ondansetron (ZOFRAN) 4 MG tablet Take 4 mg by mouth every 8 (eight) hours as needed for nausea or vomiting.    . ranitidine (ZANTAC) 150 MG capsule Take 150 mg by mouth daily.     Marland Kitchen spironolactone (ALDACTONE) 25 MG tablet Take  25 mg by mouth daily.     . metolazone (ZAROXOLYN) 2.5 MG tablet Take 1 tablet (2.5 mg total) by mouth once a week. You may take an extra tablet later in the week if you have additional swelling. No more than 2 tablets per week. 8 tablet 2   No current facility-administered medications for this visit.    Allergies:  Bactrim; Cephalexin; Clindamycin; Contrast media [iodinated diagnostic agents]; Latex; Levofloxacin; Nitrofurantoin monohyd macro; and Penicillins   Social History: The patient  reports that she quit smoking about 19 years ago. Her smoking use included Cigarettes. She started smoking about 66 years ago. She has a 6.00 pack-year smoking history. She has never used smokeless  tobacco. She reports that she does not drink alcohol or use drugs.   ROS:  Please see the history of present illness. Otherwise, complete review of systems is positive for chronic fatigue.  All other systems are reviewed and negative.   Physical Exam: VS:  BP 98/60   Pulse 68   Ht 5\' 4"  (1.626 m)   Wt 192 lb (87.1 kg)   SpO2 95%   BMI 32.96 kg/m , BMI Body mass index is 32.96 kg/m.  Wt Readings from Last 3 Encounters:  09/04/15 192 lb (87.1 kg)  05/30/15 194 lb (88 kg)  03/01/15 189 lb (85.7 kg)    Overweight woman, appears comfortable at rest.  HEENT: Conjunctiva and lids normal, oropharynx clear with poor dentition. Neck: Supple, no elevated JVP, no carotid bruits, no thyromegaly.  Lungs: Clear to auscultation with decreased breath sounds, nonlabored breathing at rest.  Cardiac: Regular rate and rhythm, no S3 or significant systolic murmur, no pericardial rub.  Abdomen: Soft, nontender, bowel sounds present, no guarding or rebound.  Extremities: Mild lower leg edema and adipose tissue, stasis. Skin: Warm and dry. Musculoskeletal: No kyphosis. Neuropsychiatric: Alert and oriented 3, affect appropriate.  ECG: I personally reviewed the tracing from 12/05/2014 which showed an atrial paced rhythm with nonspecific ST changes.  Recent Labwork:  May 2017: BUN 11, creatinine 0.9, potassium 4.1  Other Studies Reviewed Today:  Echocardiogram 03/24/2014: Study Conclusions  - Left ventricle: Systolic function was moderately reduced. Estimated LVEF 40%. Global hypokinesis was seen. The cavity size was normal. Wall thickness was increased in a pattern of mild LVH. Features are consistent with a pseudonormal left ventricular filling pattern, with concomitant abnormal relaxation and increased filling pressure (grade 2 diastolic dysfunction). Doppler parameters are consistent with high ventricular filling pressure. - Left atrium: The atrium was mildly dilated.  Volume/bsa, ES, (1-plane Simpson&'s, A2C): 30.3 ml/m^2. - Right ventricle: Pacer wire or catheter noted in right ventricle. Systolic function was mildly reduced. - Right atrium: The atrium was mildly dilated. Pacer wire or catheter noted in right atrium. - Tricuspid valve: There was mild regurgitation.  Assessment and Plan:  1. Chronic combined heart failure with LVEF 40% and grade 2 diastolic dysfunction. We will add Zaroxolyn 2.5 mg once to twice weekly as needed based on weight gain or edema, otherwise stay on present dose of Lasix. Continue to weigh daily. Follow-up BMET for next visit.  2. Nonischemic cardiomyopathy. Continue current regimen except hold off on losartan for now given orthostatic dizziness.  3. St. Jude biventricular ICD in place, continue follow-up with Dr. [name]. Recent thoracic impedance evaluation noted. Patient reports no device shocks.  4. Mitral regurgitation, only trivial by last evaluation.  Current medicines were reviewed with the patient today.  Disposition: Follow-up with me in 3 months.  Signed, Satira Sark, MD, [facility] 09/04/2015 1:31 PM    [facility] [place], [place], [place] [address] Phone: [phone]; Fax: [phone]

## 2015-09-04 NOTE — Patient Instructions (Addendum)
Medication Instructions:   Your physician has recommended you make the following change in your medication:   Stop losartan (cozaar).  Start metolazone 2.5 mg weekly on Mondays. You may take an extra pill later in the week if you have additional swelling. No more than 2 tablets per week.  Continue all other medications the same.  Labwork:  Your physician recommends that you return for lab work in 3 months just before your next visit to check your BMET. Your lab work order has been given to you today with your visit.  Testing/Procedures: NONE  Follow-Up:  Your physician recommends that you schedule a follow-up appointment in: 3 months.  Any Other Special Instructions Will Be Listed Below (If Applicable).  Please contact our office in a couple of weeks to let us know how your swelling is doing.  If you need a refill on your cardiac medications before your next appointment, please call your pharmacy.

## 2015-10-10 ENCOUNTER — Ambulatory Visit (INDEPENDENT_AMBULATORY_CARE_PROVIDER_SITE_OTHER): Payer: Medicare Other | Admitting: *Deleted

## 2015-10-10 DIAGNOSIS — Z95810 Presence of automatic (implantable) cardiac defibrillator: Secondary | ICD-10-CM | POA: Diagnosis not present

## 2015-10-10 DIAGNOSIS — I5042 Chronic combined systolic (congestive) and diastolic (congestive) heart failure: Secondary | ICD-10-CM

## 2015-10-10 DIAGNOSIS — I428 Other cardiomyopathies: Secondary | ICD-10-CM

## 2015-10-10 NOTE — Progress Notes (Signed)
Remote ICD transmission.   

## 2015-10-10 NOTE — Progress Notes (Signed)
EPIC Encounter for ICM Monitoring  Patient Name: Melissa Paul is a 70 y.o. female Date: 10/10/2015 Primary Care Physican: Deloria Lair, MD Primary Cardiologist: Domenic Polite Electrophysiologist: Allred Dry Weight: 182 lb  Bi-V Pacing:  97%       Heart Failure questions reviewed, pt asymptomatic.  She has not filled the prescription for Metolazone yet but will call about that today.   Thoracic impedance normal.  Recommendations: No changes.  Low sodium diet education provided.    Follow-up plan: ICM clinic phone appointment on 11/10/2015.  Copy of ICM check sent to device physician.   ICM trend: 10/10/2015       [name], RN 10/10/2015 12:20 PM

## 2015-10-11 ENCOUNTER — Encounter: Payer: Self-pay | Admitting: Cardiology

## 2015-11-07 LAB — CUP PACEART REMOTE DEVICE CHECK
Battery Remaining Longevity: 10 mo
Battery Remaining Percentage: 13 %
Battery Voltage: 2.72 V
Brady Statistic AP VP Percent: 87 %
Brady Statistic AP VS Percent: 1 %
Brady Statistic AS VP Percent: 9.3 %
Brady Statistic AS VS Percent: 1 %
Brady Statistic RA Percent Paced: 86 %
Date Time Interrogation Session: 20170919094756
HighPow Impedance: 80 Ohm
HighPow Impedance: 80 Ohm
Implantable Lead Implant Date: 20110912
Implantable Lead Implant Date: 20110912
Implantable Lead Implant Date: 20120710
Implantable Lead Location: 753858
Implantable Lead Location: 753859
Implantable Lead Location: 753860
Implantable Lead Model: 7122
Lead Channel Impedance Value: 330 Ohm
Lead Channel Impedance Value: 460 Ohm
Lead Channel Impedance Value: 480 Ohm
Lead Channel Pacing Threshold Amplitude: 0.625 V
Lead Channel Pacing Threshold Amplitude: 0.75 V
Lead Channel Pacing Threshold Amplitude: 0.875 V
Lead Channel Pacing Threshold Pulse Width: 0.5 ms
Lead Channel Pacing Threshold Pulse Width: 0.5 ms
Lead Channel Pacing Threshold Pulse Width: 0.5 ms
Lead Channel Sensing Intrinsic Amplitude: 11.8 mV
Lead Channel Sensing Intrinsic Amplitude: 3.5 mV
Lead Channel Setting Pacing Amplitude: 2 V
Lead Channel Setting Pacing Amplitude: 2 V
Lead Channel Setting Pacing Amplitude: 2.875
Lead Channel Setting Pacing Pulse Width: 0.5 ms
Lead Channel Setting Pacing Pulse Width: 0.5 ms
Lead Channel Setting Sensing Sensitivity: 0.5 mV
Pulse Gen Serial Number: 794739

## 2015-11-09 ENCOUNTER — Telehealth: Payer: Self-pay | Admitting: Cardiology

## 2015-11-09 NOTE — Telephone Encounter (Signed)
Pt called and stated that she can no longer afford her remote monitoring appointments. Pt is going to discuss this further w/ MD at her appt on 12-11-2015.

## 2015-11-29 ENCOUNTER — Encounter: Payer: Self-pay | Admitting: Internal Medicine

## 2015-11-29 ENCOUNTER — Ambulatory Visit (INDEPENDENT_AMBULATORY_CARE_PROVIDER_SITE_OTHER): Payer: Medicare Other | Admitting: Internal Medicine

## 2015-11-29 VITALS — BP 102/69 | HR 60 | Ht 64.0 in | Wt 186.0 lb

## 2015-11-29 DIAGNOSIS — Z95810 Presence of automatic (implantable) cardiac defibrillator: Secondary | ICD-10-CM | POA: Diagnosis not present

## 2015-11-29 DIAGNOSIS — I5022 Chronic systolic (congestive) heart failure: Secondary | ICD-10-CM | POA: Diagnosis not present

## 2015-11-29 DIAGNOSIS — I472 Ventricular tachycardia, unspecified: Secondary | ICD-10-CM

## 2015-11-29 LAB — CUP PACEART INCLINIC DEVICE CHECK
Battery Remaining Longevity: 7 mo
Brady Statistic RA Percent Paced: 86 %
Brady Statistic RV Percent Paced: 96 %
Date Time Interrogation Session: 20171108121715
HighPow Impedance: 79.875
Implantable Lead Implant Date: 20110912
Implantable Lead Implant Date: 20110912
Implantable Lead Implant Date: 20120710
Implantable Lead Location: 753858
Implantable Lead Location: 753859
Implantable Lead Location: 753860
Implantable Lead Model: 7122
Implantable Pulse Generator Implant Date: 20110912
Lead Channel Impedance Value: 337.5 Ohm
Lead Channel Impedance Value: 562.5 Ohm
Lead Channel Impedance Value: 650 Ohm
Lead Channel Pacing Threshold Amplitude: 0.5 V
Lead Channel Pacing Threshold Amplitude: 0.5 V
Lead Channel Pacing Threshold Amplitude: 0.75 V
Lead Channel Pacing Threshold Pulse Width: 0.5 ms
Lead Channel Pacing Threshold Pulse Width: 0.5 ms
Lead Channel Pacing Threshold Pulse Width: 0.5 ms
Lead Channel Sensing Intrinsic Amplitude: 12 mV
Lead Channel Sensing Intrinsic Amplitude: 4.8 mV
Lead Channel Setting Pacing Amplitude: 2 V
Lead Channel Setting Pacing Amplitude: 2 V
Lead Channel Setting Pacing Amplitude: 2.75 V
Lead Channel Setting Pacing Pulse Width: 0.5 ms
Lead Channel Setting Pacing Pulse Width: 0.5 ms
Lead Channel Setting Sensing Sensitivity: 0.5 mV
Pulse Gen Serial Number: 794739

## 2015-11-29 NOTE — Progress Notes (Signed)
Electrophysiology Office Note   Date:  11/29/2015   ID:  Melissa Paul, DOB 1945-05-10, MRN [id number]  PCP:  Melissa Lair, MD  Cardiologist:  Dr Domenic Polite Primary Electrophysiologist: Melissa Grayer, MD    CC: CHF   History of Present Illness: Melissa Paul is a 70 y.o. female who presents today for electrophysiology follow-up.   She is doing reasonably well.  She has had no further ventricular arrhythmias.  Today, she denies symptoms of palpitations, chest pain, shortness of breath, orthopnea, PND, claudication, dizziness, presyncope, syncope, bleeding, or neurologic sequela. The patient is tolerating medications without difficulties and is otherwise without complaint today.    Past Medical History:  Diagnosis Date  . Chronic systolic heart failure (Melissa Paul)   . Glucose intolerance (pre-diabetes)   . Hyperlipemia   . Hypothyroidism   . LBBB (left bundle branch block)   . Mitral regurgitation    Mild to Moderate  . Nonischemic cardiomyopathy (Melissa Paul)    LVEF 35-40% by echo 8/13  . Paroxysmal atrial fibrillation (Melissa Paul)    Brief episodes by device interrogation  . UTI (urinary tract infection)    Recurrent  . Ventricular tachycardia (Melissa Paul)    ICD therapy for VT   Past Surgical History:  Procedure Laterality Date  . APPENDECTOMY    . CARDIAC DEFIBRILLATOR PLACEMENT  09/2009   St. Jude BiV ICD by Dr. Sandria Paul class III  . CHOLECYSTECTOMY    . KNEE ARTHROSCOPY     Left  . LEFT AND RIGHT HEART CATHETERIZATION WITH CORONARY ANGIOGRAM N/A 04/12/2014   Procedure: LEFT AND RIGHT HEART CATHETERIZATION WITH CORONARY ANGIOGRAM;  Surgeon: Melissa Man, MD;  Location: [facility] CATH LAB;  Service: Cardiovascular;  Laterality: N/A;  . TOTAL ABDOMINAL HYSTERECTOMY     (R) ovary removed     Current Outpatient Prescriptions  Medication Sig Dispense Refill  . aspirin 81 MG tablet Take 1 tablet (81 mg total) by mouth daily. 30 tablet   . carvedilol (COREG) 12.5 MG tablet TAKE ONE TABLET  BY MOUTH TWICE DAILY 180 tablet 3  . cetirizine (ZYRTEC) 10 MG tablet Take 10 mg by mouth daily as needed for allergies or rhinitis.     Marland Kitchen diazepam (VALIUM) 5 MG tablet Take one tablet every morning per patient    . digoxin (LANOXIN) 0.125 MG tablet Take 1 tablet by mouth daily.     . furosemide (LASIX) 80 MG tablet Take 80 mg in the morning and 40 mg in the evening every other day (Patient taking differently: Take 80 mg in the morning and an additional 40 mg as needed for fluid retention) 45 tablet 6  . levothyroxine (SYNTHROID, LEVOTHROID) 75 MCG tablet Take 37.5 mcg by mouth daily.     . metFORMIN (GLUCOPHAGE) 500 MG tablet Take 500 mg by mouth 2 (two) times daily.    . ondansetron (ZOFRAN) 4 MG tablet Take 4 mg by mouth every 8 (eight) hours as needed for nausea or vomiting.    . ranitidine (ZANTAC) 150 MG capsule Take 150 mg by mouth daily.     Marland Kitchen spironolactone (ALDACTONE) 25 MG tablet Take 25 mg by mouth daily.      No current facility-administered medications for this visit.     Allergies:   Bactrim; Cephalexin; Clindamycin; Contrast media [iodinated diagnostic agents]; Latex; Levofloxacin; Nitrofurantoin monohyd macro; and Penicillins   Social History:  The patient  reports that she quit smoking about 19 years ago. Her smoking use included Cigarettes. She started smoking  about 66 years ago. She has a 6.00 pack-year smoking history. She has never used smokeless tobacco. She reports that she does not drink alcohol or use drugs.   Family History:  The patient's family history includes CVA in her mother; Heart Problems in her mother; Heart disease in her maternal grandfather and maternal grandmother.    ROS:  Please see the history of present illness.   All other systems are reviewed and negative.    PHYSICAL EXAM: VS:  BP 102/69   Pulse 60   Ht 5\' 4"  (1.626 m)   Wt 186 lb (84.4 kg)   SpO2 96% Comment: on room air  BMI 31.93 kg/m  , BMI Body mass index is 31.93 kg/m. GEN: Well  nourished, well developed, in no acute distress  HEENT: very poor dentition Neck: mildly elevated JVP, carotid bruits, or masses Cardiac: RRR; no murmurs, rubs, or gallops,trace edema  Respiratory:  clear to auscultation bilaterally, normal work of breathing GI: soft, nontender, nondistended, + BS MS: no deformity or atrophy  Skin: warm and dry, device pocket is well healed Neuro:  Strength and sensation are intact Psych: euthymic mood, full affect  Device interrogation is reviewed today in detail.  See PaceArt for details.   Recent Labs: No results found for requested labs within last 8760 hours.    Lipid Panel     Component Value Date/Time   CHOL  04/11/2007 0600    139        ATP III CLASSIFICATION:  <200     mg/dL   Desirable  200-239  mg/dL   Borderline High  >=240    mg/dL   High   TRIG 51 04/11/2007 0600   HDL 42 04/11/2007 0600   CHOLHDL 3.3 04/11/2007 0600   VLDL 10 04/11/2007 0600   LDLCALC  04/11/2007 0600    87        Total Cholesterol/HDL:CHD Risk Coronary Heart Disease Risk Table                     Men   Women  1/2 Average Risk   3.4   3.3     Wt Readings from Last 3 Encounters:  11/29/15 186 lb (84.4 kg)  09/04/15 192 lb (87.1 kg)  05/30/15 194 lb (88 kg)    Dr Chuck Hint notes are reviewed  ASSESSMENT AND PLAN:  1.  VF Controlled, without recurrence Normal ICD function See Pace Art report No changes today Approaching ERI. Risks, benefits, and alternatives to ICD pulse generator replacement were discussed in detail today.  The patient understands that risks include but are not limited to bleeding, infection, pneumothorax, perforation, tamponade, vascular damage, renal failure, MI, stroke, death, inappropriate shocks, damage to his existing leads, and lead dislodgement and wishes to proceed once ERI.  Vibratory alert demonstrated today.  She is actively remotely monitored and understands the importance of compliance today.  Return in 9 months to  see me.  If she reaches ERI in the interim, then she can be scheduled for gen change without an office visit.  2. afib asymptomatic chads2vasc score is at least 3.  She is clear that she will not start anticoagulation at this time. Avoid AADs unless burden increases  3. chronic systolic dysfunction euvolemic today 2 gram sodium restriction advised  4. Poor dentition Very poor dentition Risks of device infection increase were discussed today.  I have strongly encouraged her to see a dentist to arrange for appropriate management previously.  Unfortunately, she has failed to do so.  Merlin Return to see me in [place] in 9 months unless she reaches ERI in the interim.  Current medicines are reviewed at length with the patient today.   The patient does not have concerns regarding her medicines.  The following changes were made today:  none  Signed, Melissa Grayer, MD  11/29/2015 1:12 PM     [address]  [place] [id number] [phone] (office) [phone] (fax)

## 2015-11-29 NOTE — Patient Instructions (Signed)
Medication Instructions:  Continue all current medications.  Labwork: none  Testing/Procedures: none  Follow-Up: 9 months   Any Other Special Instructions Will Be Listed Below (If Applicable). Remote monitoring is used to monitor your Pacemaker of ICD from home. This monitoring reduces the number of office visits required to check your device to one time per year. It allows [place] to keep an eye on the functioning of your device to ensure it is working properly. You are scheduled for a device check from home on 03/01/2015. You may send your transmission at any time that day. If you have a wireless device, the transmission will be sent automatically. After your physician reviews your transmission, you will receive a postcard with your next transmission date.  If you need a refill on your cardiac medications before your next appointment, please call your pharmacy.

## 2015-12-01 ENCOUNTER — Encounter: Payer: Medicare Other | Admitting: Internal Medicine

## 2015-12-05 NOTE — Progress Notes (Signed)
Patient requested to resume ICM monthly follow ups at last office visit with Dr [name].  Scheduled ICM remote transmission for 12/25/2015.

## 2015-12-18 ENCOUNTER — Other Ambulatory Visit: Payer: Self-pay | Admitting: Cardiology

## 2015-12-28 ENCOUNTER — Telehealth: Payer: Self-pay | Admitting: Cardiology

## 2015-12-28 NOTE — Telephone Encounter (Signed)
Patient informed that we did receive those labs from her pmd done on 12/04/2015.  Advised to hold off on any further labs till see Dr. Domenic Polite on 01/02/2016.

## 2015-12-28 NOTE — Telephone Encounter (Signed)
Wants to know if she still needs to have labs done.  She just had them done at PCP on 12/04/15

## 2016-01-01 ENCOUNTER — Telehealth: Payer: Self-pay

## 2016-01-01 ENCOUNTER — Ambulatory Visit (INDEPENDENT_AMBULATORY_CARE_PROVIDER_SITE_OTHER): Payer: Medicare Other

## 2016-01-01 DIAGNOSIS — I5022 Chronic systolic (congestive) heart failure: Secondary | ICD-10-CM | POA: Diagnosis not present

## 2016-01-01 DIAGNOSIS — Z95810 Presence of automatic (implantable) cardiac defibrillator: Secondary | ICD-10-CM | POA: Diagnosis not present

## 2016-01-01 NOTE — Progress Notes (Signed)
Cardiology Office Note  Date: 01/02/2016   ID: [name], DOB 05-20-1945, MRN [id number]  PCP: Melissa Lair, MD  Primary Cardiologist: Melissa Lesches, MD   Chief Complaint  Patient presents with  . Cardiomyopathy    History of Present Illness: Melissa Paul is a 70 y.o. female last seen in August. She is here with her daughter today. Reports experiencing increased leg edema over the last several weeks. This has also been complicated by a reported bladder infection with decreased urine output. She is being followed by a urologist here in [place]. It sounds like she has failed standard antibiotics and is to have a PICC line placed for IV antibiotics.  She continues to follow with Dr. Rayann Paul in the device clinic. She has a St. Jude biventricular ICD in place. Recent device check indicated 93% biventricular pacing. Patient also had abnormal thoracic impedance indicating fluid accumulation.  She tells me that she has been taking Lasix 80 mg daily with additional 40 mg in the afternoon every other day. I reviewed her most recent lab work from November. We discussed her medications as well. She never did get the Zaroxolyn filled that we had recommended last visit, states that she was not able to afford it.  Past Medical History:  Diagnosis Date  . Chronic systolic heart failure ([place])   . Glucose intolerance (pre-diabetes)   . Hyperlipemia   . Hypothyroidism   . LBBB (left bundle branch block)   . Mitral regurgitation    Mild to Moderate  . Nonischemic cardiomyopathy ([place])    LVEF 35-40% by echo 8/13  . Paroxysmal atrial fibrillation ([facility])    Brief episodes by device interrogation  . UTI (urinary tract infection)    Recurrent  . Ventricular tachycardia ([place])    ICD therapy for VT    Past Surgical History:  Procedure Laterality Date  . APPENDECTOMY    . CARDIAC DEFIBRILLATOR PLACEMENT  09/2009   St. Jude BiV ICD by Dr. Sandria Manly class III  . CHOLECYSTECTOMY    .  KNEE ARTHROSCOPY     Left  . LEFT AND RIGHT HEART CATHETERIZATION WITH CORONARY ANGIOGRAM N/A 04/12/2014   Procedure: LEFT AND RIGHT HEART CATHETERIZATION WITH CORONARY ANGIOGRAM;  Surgeon: Melissa Man, MD;  Location: [facility] CATH LAB;  Service: Cardiovascular;  Laterality: N/A;  . TOTAL ABDOMINAL HYSTERECTOMY     (R) ovary removed    Current Outpatient Prescriptions  Medication Sig Dispense Refill  . aspirin 81 MG tablet Take 1 tablet (81 mg total) by mouth daily. 30 tablet   . carvedilol (COREG) 12.5 MG tablet TAKE ONE TABLET BY MOUTH TWICE DAILY 180 tablet 3  . cetirizine (ZYRTEC) 10 MG tablet Take 10 mg by mouth daily as needed for allergies or rhinitis.     Marland Kitchen diazepam (VALIUM) 5 MG tablet Take one tablet every morning per patient    . levothyroxine (SYNTHROID, LEVOTHROID) 75 MCG tablet Take 37.5 mcg by mouth daily.     . metFORMIN (GLUCOPHAGE) 500 MG tablet Take 500 mg by mouth 2 (two) times daily.    . ondansetron (ZOFRAN) 4 MG tablet Take 4 mg by mouth every 8 (eight) hours as needed for nausea or vomiting.    . ranitidine (ZANTAC) 150 MG capsule Take 150 mg by mouth daily.     Marland Kitchen spironolactone (ALDACTONE) 25 MG tablet Take 25 mg by mouth daily.     . furosemide (LASIX) 80 MG tablet Take 80 mg in the morning  and 40 mg in the evening 135 tablet 1   No current facility-administered medications for this visit.    Allergies:  Bactrim; Cephalexin; Clindamycin; Contrast media [iodinated diagnostic agents]; Latex; Levofloxacin; Nitrofurantoin monohyd macro; and Penicillins   Social History: The patient  reports that she quit smoking about 19 years ago. Her smoking use included Cigarettes. She started smoking about 66 years ago. She has a 6.00 pack-year smoking history. She has never used smokeless tobacco. She reports that she does not drink alcohol or use drugs.   ROS:  Please see the history of present illness. Otherwise, complete review of systems is positive for fatigue.  All other  systems are reviewed and negative.   Physical Exam: VS:  BP 90/62   Pulse 62   Ht 5\' 4"  (1.626 m)   Wt 185 lb 3.2 oz (84 kg)   SpO2 94%   BMI 31.79 kg/m , BMI Body mass index is 31.79 kg/m.  Wt Readings from Last 3 Encounters:  01/02/16 185 lb 3.2 oz (84 kg)  11/29/15 186 lb (84.4 kg)  09/04/15 192 lb (87.1 kg)    Overweight woman, appears comfortable at rest.  HEENT: Conjunctiva and lids normal, oropharynx clear with poor dentition. Neck: Supple, no elevated JVP, no carotid bruits, no thyromegaly.  Lungs: Clear to auscultation with decreased breath sounds, nonlabored breathing at rest.  Cardiac: Regular rate and rhythm, no S3 or significant systolic murmur, no pericardial rub.  Abdomen: Soft, nontender, bowel sounds present, no guarding or rebound.  Extremities: Mild lower leg edema and adipose tissue, stasis. Skin: Warm and dry. Musculoskeletal: No kyphosis. Neuropsychiatric: Alert and oriented 3, affect appropriate.  ECG: I personally reviewed the tracing from 11/29/2015 which showed sinus rhythm with ventricular pacing and nonspecific ST changes.  Recent Labwork:  November 2017: BUN 12, creatinine 0.7, AST 18, ALT 17, potassium 3.7, hemoglobin A1c 8.0, Lanoxin level 1.8, hemoglobin 13.7, platelets 329  Other Studies Reviewed Today:  Echocardiogram 03/24/2014: Study Conclusions  - Left ventricle: Systolic function was moderately reduced. Estimated LVEF 40%. Global hypokinesis was seen. The cavity size was normal. Wall thickness was increased in a pattern of mild LVH. Features are consistent with a pseudonormal left ventricular filling pattern, with concomitant abnormal relaxation and increased filling pressure (grade 2 diastolic dysfunction). Doppler parameters are consistent with high ventricular filling pressure. - Left atrium: The atrium was mildly dilated. Volume/bsa, ES, (1-plane Simpson&'s, A2C): 30.3 ml/m^2. - Right ventricle: Pacer wire  or catheter noted in right ventricle. Systolic function was mildly reduced. - Right atrium: The atrium was mildly dilated. Pacer wire or catheter noted in right atrium. - Tricuspid valve: There was mild regurgitation.  Assessment and Plan:  1. Chronic combined heart failure with LVEF approximately 40% by last assessment and moderate diastolic dysfunction. She has had some worsening leg edema, possibly complicated by urinary tract infection based on her discussion today. For now we will increase Lasix to 80 mg in the morning and 40 mg in the evening on a daily basis, at least over the next week. Once leg edema is better controlled she may be able to go back to the prior Lasix dose. Follow-up with BM[facility] arranged.  2. Nonischemic cardiomyopathy with LVEF approximately 40%. Plan to stop Lanoxin to avoid toxicity and also simplify her regimen. Continue Coreg, Lasix, and Aldactone. With low normal to low blood pressure at baseline, not adding ARB.  3. St. Jude biventricular ICD in place, followed by Dr. Rayann Paul.  4. History of VT,  recently quiescent.  Current medicines were reviewed with the patient today.   Orders Placed This Encounter  Procedures  . Basic Metabolic Panel (BMET)    Disposition: Follow-up with me in one month.  Signed, Satira Sark, MD, [facility] 01/02/2016 1:28 PM    [facility], [place], [place] [address] Phone: [phone]; Fax: [phone]

## 2016-01-01 NOTE — Progress Notes (Signed)
EPIC Encounter for ICM Monitoring  Patient Name: Melissa Paul is a 70 y.o. female Date: 01/01/2016 Primary Care Physican: Deloria Lair, MD Primary Cardiologist:[place] Electrophysiologist: Allred Dry Weight:    unknown Bi-V Pacing:  93%        Heart Failure questions reviewed, pt symptomatic with large amount of swelling in legs.  Her legs are the same circumference from knee down to feet and she has swelling in her thighs as well.   She has a bacterial bladder infection and will be given IV antibiotics at home starting this week.    Thoracic impedance close to baseline but has been abnormal suggesting fluid accumulation starting 12/27/2015.    Labs: 11/24/2015 Creatinine 0.67, BUN 12, Potassium 3.7, Sodium 138, EGFR >60   Recommendations:   Patient reported her daily dosage of Furosemide is 80 mg in the morning and will take 40 mg prn in the afternoon for symptoms.  She does not think the Furosemide is working and she did not get the script filled from last office visit with Dr Domenic Polite for [employer].  She was supposed to tak 2.5 mg once to twice weekly.     Follow-up plan: ICM clinic phone appointment on 01/09/2016 to recheck fluid levels.   Office visit with Dr Domenic Polite on 01/02/2016  Copy of ICM check sent to primary cardiologist and device physician for review.   ICM trend: 01/01/2016             [name], RN 01/01/2016 2:08 PM

## 2016-01-01 NOTE — Telephone Encounter (Signed)
ICM call and spoke with patient.  Requested to send remote ICM transmission today for review.  She had an appt with Dr Domenic Polite tomorrow.  She said she would send later today.

## 2016-01-02 ENCOUNTER — Ambulatory Visit (INDEPENDENT_AMBULATORY_CARE_PROVIDER_SITE_OTHER): Payer: Medicare Other | Admitting: Cardiology

## 2016-01-02 ENCOUNTER — Encounter: Payer: Self-pay | Admitting: Cardiology

## 2016-01-02 VITALS — BP 90/62 | HR 62 | Ht 64.0 in | Wt 185.2 lb

## 2016-01-02 DIAGNOSIS — I472 Ventricular tachycardia, unspecified: Secondary | ICD-10-CM

## 2016-01-02 DIAGNOSIS — Z95810 Presence of automatic (implantable) cardiac defibrillator: Secondary | ICD-10-CM

## 2016-01-02 DIAGNOSIS — I428 Other cardiomyopathies: Secondary | ICD-10-CM | POA: Diagnosis not present

## 2016-01-02 DIAGNOSIS — I5042 Chronic combined systolic (congestive) and diastolic (congestive) heart failure: Secondary | ICD-10-CM | POA: Diagnosis not present

## 2016-01-02 MED ORDER — FUROSEMIDE 80 MG PO TABS: ORAL_TABLET | ORAL | 1 refills | Status: DC

## 2016-01-02 NOTE — Patient Instructions (Signed)
Your physician recommends that you schedule a follow-up appointment in: [place]  Your physician has recommended you make the following change in your medication:   STOP DIGOXIN   CHANGE LASIX 80 MG IN THE MORNING AND 40 MG IN THE EVENING  Your physician recommends that you return for lab work JUST PRIOR TO YOUR NEXT VISIT BMP  Thank you for choosing [facility]!!

## 2016-01-04 ENCOUNTER — Ambulatory Visit (HOSPITAL_COMMUNITY)
Admission: RE | Admit: 2016-01-04 | Discharge: 2016-01-04 | Disposition: A | Discharge status: Home / Self Care | Payer: Medicare Other | Source: Ambulatory Visit | Attending: Urology | Admitting: Urology

## 2016-01-04 ENCOUNTER — Encounter (HOSPITAL_COMMUNITY)
Admission: RE | Admit: 2016-01-04 | Discharge: 2016-01-04 | Disposition: A | Discharge status: Home / Self Care | Payer: Medicare Other | Source: Ambulatory Visit | Attending: Urology | Admitting: Urology

## 2016-01-04 DIAGNOSIS — Z452 Encounter for adjustment and management of vascular access device: Secondary | ICD-10-CM | POA: Diagnosis present

## 2016-01-04 DIAGNOSIS — N390 Urinary tract infection, site not specified: Secondary | ICD-10-CM | POA: Diagnosis not present

## 2016-01-04 LAB — BASIC METABOLIC PANEL
Anion gap: 7 (ref 5–15)
BUN: 13 mg/dL (ref 6–20)
CO2: 27 mmol/L (ref 22–32)
Calcium: 8.8 mg/dL — ABNORMAL LOW (ref 8.9–10.3)
Chloride: 100 mmol/L — ABNORMAL LOW (ref 101–111)
Creatinine, Ser: 0.79 mg/dL (ref 0.44–1.00)
GFR calc Af Amer: >60 mL/min (ref >60)
GFR calc non Af Amer: >60 mL/min (ref >60)
Glucose, Bld: 141 mg/dL — ABNORMAL HIGH (ref 65–99)
Potassium: 3.8 mmol/L (ref 3.5–5.1)
Sodium: 134 mmol/L — ABNORMAL LOW (ref 135–145)

## 2016-01-04 MED ORDER — SODIUM CHLORIDE 0.9 % IV SOLN
INTRAVENOUS | Status: DC
  Administered 2016-01-04: 14:00:00 via INTRAVENOUS

## 2016-01-04 MED ORDER — VANCOMYCIN HCL 10 G IV SOLR
1250.0000 mg | Freq: Once | INTRAVENOUS | Status: AC
  Administered 2016-01-04: 1250 mg via INTRAVENOUS
  Filled 2016-01-04: qty 1250

## 2016-01-04 MED ORDER — VANCOMYCIN HCL 10 G IV SOLR: 1500.0000 mg | Freq: Once | INTRAVENOUS | Status: DC

## 2016-01-04 NOTE — Progress Notes (Signed)
Pharmacy Antibiotic Note  Melissa Paul is a 70 y.o. female presents to clinic with UTI.  Pharmacy has been consulted for VANCOMYCIN dosing.  No SCr available today but previous checks have been normal.    Plan: Vancomycin 1250mg  IV x 1 dose now in clinic F/U requests for additional doses and planned LOT     No data recorded.  No results for input(s): WBC, CREATININE, LATICACIDVEN, VANCOTROUGH, VANCOPEAK, VANCORANDOM, GENTTROUGH, GENTPEAK, GENTRANDOM, TOBRATROUGH, TOBRAPEAK, TOBRARND, AMIKACINPEAK, AMIKACINTROU, AMIKACIN in the last 168 hours.  CrCl cannot be calculated (Patient's most recent lab result is older than the maximum 21 days allowed.).    Allergies  Allergen Reactions  . Bactrim     UNKNOWN  . Cephalexin     UNKNOWN   . Clindamycin     UNKNOWN   . Contrast Media [Iodinated Diagnostic Agents]     UNKNOWN   . Latex     UNKNOWN   . Levofloxacin     UNKNOWN   . Nitrofurantoin Monohyd Macro     UNKNOWN   . Penicillins     UNKNOWN     Hart Robinsons A 01/04/2016 10:37 AM

## 2016-01-04 NOTE — Discharge Instructions (Signed)
PICC Insertion, Care After Refer to this sheet in the next few weeks. These instructions provide you with information on caring for yourself after your procedure. Your health care provider may also give you more specific instructions. Your treatment has been planned according to current medical practices, but problems sometimes occur. Call your health care provider if you have any problems or questions after your procedure. What can I expect after the procedure? After your procedure, it is typical to have the following:  Mild discomfort at the insertion site. This should not last more than a day. Follow these instructions at home:  Rest at home for the remainder of the day after the procedure.  You may bend your arm and move it freely. If your PICC is near or at the bend of your elbow, avoid activity with repeated motion at the elbow.  Avoid lifting heavy objects as instructed by your health care provider.  Avoid using a crutch with the arm on the same side as your PICC. You may need to use a walker. Bandage Care  Keep your PICC bandage (dressing) clean and dry to prevent infection.  Ask your health care provider when you may shower. To keep the dressing dry, cover the PICC with plastic wrap and tape before showering. If the dressing does become wet, replace it right after the shower.  Do not soak in the bath, swim, or use hot tubs when you have a PICC.  Change the PICC dressing as instructed by your health care provider.  Change your PICC dressing if it becomes loose or wet. General PICC Care  Check the PICC insertion site daily for leakage, redness, swelling, or pain.  Flush the PICC as directed by your health care provider. Let your health care provider know right away if the PICC is difficult to flush or does not flush. Do not use force to flush the PICC.  Do not use a syringe that is less than 10 mL to flush the PICC.  Never pull or tug on the PICC.  Avoid blood pressure  checks on the arm with the PICC.  Keep your PICC identification card with you at all times.  Do not take the PICC out yourself. Only a trained health care professional should remove the PICC. Contact a health care provider if:  You have pain in your arm, ear, face, or teeth.  You have fever or chills.  You have drainage from the PICC insertion site.  You have redness or palpate a "cord" around the PICC insertion site.  You cannot flush the catheter. Get help right away if:  You have swelling in the arm in which the PICC is inserted. This information is not intended to replace advice given to you by your health care provider. Make sure you discuss any questions you have with your health care provider. Document Released: 10/28/2012 Document Revised: 06/15/2015 Document Reviewed: 10/30/2012 Elsevier Interactive Patient Education  2017 [place] A peripherally inserted central catheter (PICC) is a long, thin, flexible tube that is inserted into a vein in the upper arm. It is a form of intravenous (IV) access. It is considered to be a "central" line because the tip of the PICC ends in a large vein in your chest. This large vein is called the superior vena cava (SVC). The PICC tip ends in the SVC because there is a lot of blood flow in the SVC. This allows medicines and IV fluids to be quickly distributed throughout the  body. The PICC is inserted using a sterile technique by a specially trained nurse or physician. After the PICC is inserted, a chest X-ray exam is done to be sure it is in the correct place. A PICC may be placed for different reasons, such as:  To give medicines and liquid nutrition that can only be given through a central line. Examples are:  Certain antibiotic treatments.  Chemotherapy.  Total parenteral nutrition (TPN).  To take frequent blood samples.  To give IV fluids and blood products.  If there is difficulty placing a peripheral intravenous  (PIV) catheter. If taken care of properly, a PICC can remain in place for several months. A PICC can also allow a person to go home from the hospital early. Medicine and PICC care can be managed at home by a family member or home health care team. What problems can happen when I have a PICC? Problems with a PICC can occasionally occur. These may include the following:  A blood clot (thrombus) forming in or at the tip of the PICC. This can cause the PICC to become clogged. A clot-dissolving medicine called tissue plasminogen activator (tPA) can be given through the PICC to help break up the clot.  Inflammation of the vein (phlebitis) in which the PICC is placed. Signs of inflammation may include redness, pain at the insertion site, red streaks, or being able to feel a "cord" in the vein where the PICC is located.  Infection in the PICC or at the insertion site. Signs of infection may include fever, chills, redness, swelling, or pus drainage from the PICC insertion site.  PICC movement (malposition). The PICC tip may move from its original position due to excessive physical activity, forceful coughing, sneezing, or vomiting.  A break or cut in the PICC. It is important to not use scissors near the PICC.  Nerve or tendon irritation or injury during PICC insertion. What should I keep in mind about activities when I have a PICC?  You may bend your arm and move it freely. If your PICC is near or at the bend of your elbow, avoid activity with repeated motion at the elbow.  Rest at home for the remainder of the day following PICC line insertion.  Avoid lifting heavy objects as instructed by your health care provider.  Avoid using a crutch with the arm on the same side as your PICC. You may need to use a walker. What should I know about my PICC dressing?  Keep your PICC bandage (dressing) clean and dry to prevent infection.  Ask your health care provider when you may shower. Ask your health care  provider to teach you how to wrap the PICC when you do take a shower.  Change the PICC dressing as instructed by your health care provider.  Change your PICC dressing if it becomes loose or wet. What should I know about PICC care?  Check the PICC insertion site daily for leakage, redness, swelling, or pain.  Do not take a bath, swim, or use hot tubs when you have a PICC. Cover PICC line with clear plastic wrap and tape to keep it dry while showering.  Flush the PICC as directed by your health care provider. Let your health care provider know right away if the PICC is difficult to flush or does not flush. Do not use force to flush the PICC.  Do not use a syringe that is less than 10 mL to flush the PICC.  Never pull  or tug on the PICC.  Avoid blood pressure checks on the arm with the PICC.  Keep your PICC identification card with you at all times.  Do not take the PICC out yourself. Only a trained clinical professional should remove the PICC. Get help right away if:  Your PICC is accidentally pulled all the way out. If this happens, cover the insertion site with a bandage or gauze dressing. Do not throw the PICC away. Your health care provider will need to inspect it.  Your PICC was tugged or pulled and has partially come out. Do not  push the PICC back in.  There is any type of drainage, redness, or swelling where the PICC enters the skin.  You cannot flush the PICC, it is difficult to flush, or the PICC leaks around the insertion site when it is flushed.  You hear a "flushing" sound when the PICC is flushed.  You have pain, discomfort, or numbness in your arm, shoulder, or jaw on the same side as the PICC.  You feel your heart "racing" or skipping beats.  You notice a hole or tear in the PICC.  You develop chills or a fever. This information is not intended to replace advice given to you by your health care provider. Make sure you discuss any questions you have with your  health care provider. Document Released: 07/14/2002 Document Revised: 07/28/2015 Document Reviewed: 10/30/2012 Elsevier Interactive Patient Education  2017 [place] Catheter/Midline Placement  The IV Nurse has discussed with the patient and/or persons authorized to consent for the patient, the purpose of this procedure and the potential benefits and risks involved with this procedure.  The benefits include less needle sticks, lab draws from the catheter, and the patient may be discharged home with the catheter. Risks include, but not limited to, infection, bleeding, blood clot (thrombus formation), and puncture of an artery; nerve damage and irregular heartbeat and possibility to perform a PICC exchange if needed/ordered by physician.  Alternatives to this procedure were also discussed.  Bard Power PICC patient education guide, fact sheet on infection prevention and patient information card has been provided to patient /or left at bedside.    PICC/Midline Placement Documentation  PICC Single Lumen [id number] PICC Right Basilic 39 cm 0 cm (Active)  Indication for Insertion or Continuance of Line Prolonged intravenous therapies 01/04/2016  2:12 PM  Exposed Catheter (cm) 0 cm 01/04/2016  2:12 PM  Site Assessment Clean;Dry;Intact 01/04/2016  2:12 PM  Line Status Flushed;Saline locked;Blood return noted 01/04/2016  2:12 PM  Dressing Type Transparent;Securing device 01/04/2016  2:12 PM  Dressing Status Clean;Dry;Intact 01/04/2016  2:12 PM  Line Care Connections checked and tightened 01/04/2016  2:12 PM  Line Adjustment (NICU/IV Team Only) No 01/04/2016  2:12 PM  Dressing Intervention New dressing 01/04/2016  2:12 PM       Xana Bradt, Essie Hart 01/04/2016, 2:49 PMPeripherally Inserted Central Catheter/Midline Placement  The IV Nurse has discussed with the patient and/or persons authorized to consent for the patient, the purpose of this procedure and the potential  benefits and risks involved with this procedure.  The benefits include less needle sticks, lab draws from the catheter, and the patient may be discharged home with the catheter. Risks include, but not limited to, infection, bleeding, blood clot (thrombus formation), and puncture of an artery; nerve damage and irregular heartbeat and possibility to perform a PICC exchange if needed/ordered by physician.  Alternatives to this procedure were also discussed.  Bard  Power PICC patient education guide, fact sheet on infection prevention and patient information card has been provided to patient /or left at bedside.    PICC/Midline Placement Documentation  PICC Single Lumen [id number] PICC Right Basilic 39 cm 0 cm (Active)  Indication for Insertion or Continuance of Line Prolonged intravenous therapies 01/04/2016  2:12 PM  Exposed Catheter (cm) 0 cm 01/04/2016  2:12 PM  Site Assessment Clean;Dry;Intact 01/04/2016  2:12 PM  Line Status Flushed;Saline locked;Blood return noted 01/04/2016  2:12 PM  Dressing Type Transparent;Securing device 01/04/2016  2:12 PM  Dressing Status Clean;Dry;Intact 01/04/2016  2:12 PM  Line Care Connections checked and tightened 01/04/2016  2:12 PM  Line Adjustment (NICU/IV Team Only) No 01/04/2016  2:12 PM  Dressing Intervention New dressing 01/04/2016  2:12 PM       Merwin Breden, Essie Hart 01/04/2016, 2:49 PM

## 2016-01-04 NOTE — Progress Notes (Signed)
Results for DOVE, GRESHAM (MRN [id number]) as of 01/04/2016 15:48  Ref. Range 01/04/2016 14:23  Sodium Latest Ref Range: 135 - 145 mmol/L 134 (L)  Potassium Latest Ref Range: 3.5 - 5.1 mmol/L 3.8  Chloride Latest Ref Range: 101 - 111 mmol/L 100 (L)  CO2 Latest Ref Range: 22 - 32 mmol/L 27  BUN Latest Ref Range: 6 - 20 mg/dL 13  Creatinine Latest Ref Range: 0.44 - 1.00 mg/dL 0.79  Calcium Latest Ref Range: 8.9 - 10.3 mg/dL 8.8 (L)  EGFR (Non-African Amer.) Latest Ref Range: >60 mL/min >60  EGFR (African American) Latest Ref Range: >60 mL/min >60  Glucose Latest Ref Range: 65 - 99 mg/dL 141 (H)  Anion gap Latest Ref Range: 5 - 15  7

## 2016-01-04 NOTE — Progress Notes (Signed)
Peripherally Inserted Central Catheter/Midline Placement  The IV Nurse has discussed with the patient and/or persons authorized to consent for the patient, the purpose of this procedure and the potential benefits and risks involved with this procedure.  The benefits include less needle sticks, lab draws from the catheter, and the patient may be discharged home with the catheter. Risks include, but not limited to, infection, bleeding, blood clot (thrombus formation), and puncture of an artery; nerve damage and irregular heartbeat and possibility to perform a PICC exchange if needed/ordered by physician.  Alternatives to this procedure were also discussed.  Bard Power PICC patient education guide, fact sheet on infection prevention and patient information card has been provided to patient /or left at bedside.    PICC/Midline Placement Documentation  PICC Single Lumen [id number] PICC Right Basilic 39 cm 0 cm (Active)  Indication for Insertion or Continuance of Line Prolonged intravenous therapies 01/04/2016  2:12 PM  Exposed Catheter (cm) 0 cm 01/04/2016  2:12 PM  Site Assessment Clean;Dry;Intact 01/04/2016  2:12 PM  Line Status Flushed;Saline locked;Blood return noted 01/04/2016  2:12 PM  Dressing Type Transparent;Securing device 01/04/2016  2:12 PM  Dressing Status Clean;Dry;Intact 01/04/2016  2:12 PM  Line Care Connections checked and tightened 01/04/2016  2:12 PM  Line Adjustment (NICU/IV Team Only) No 01/04/2016  2:12 PM  Dressing Intervention New dressing 01/04/2016  2:12 PM       Ceejay Kegley, Essie Hart 01/04/2016, 3:52 PM

## 2016-01-09 ENCOUNTER — Ambulatory Visit (INDEPENDENT_AMBULATORY_CARE_PROVIDER_SITE_OTHER): Payer: Medicare Other

## 2016-01-09 ENCOUNTER — Telehealth: Payer: Self-pay

## 2016-01-09 DIAGNOSIS — Z95810 Presence of automatic (implantable) cardiac defibrillator: Secondary | ICD-10-CM

## 2016-01-09 DIAGNOSIS — I5042 Chronic combined systolic (congestive) and diastolic (congestive) heart failure: Secondary | ICD-10-CM

## 2016-01-09 NOTE — Telephone Encounter (Signed)
Remote ICM transmission received.  Attempted patient call and left detailed message and to return call regarding transmission.

## 2016-01-09 NOTE — Progress Notes (Addendum)
EPIC Encounter for ICM Monitoring  Patient Name: Melissa Paul is a 70 y.o. female Date: 01/09/2016 Primary Care Physican: Deloria Lair, MD Primary Cardiologist:McDowell Electrophysiologist: Allred Dry Weight:unknown Bi-V Pacing: 94%           Attempted ICM call and unable to reach.  Left message to return call.  Transmission reviewed.   Thoracic impedance returned to normal (correlates with Dr McDowell's recommendation on 12/12 to increase Lasix to 80 mg in the morning and 40 mg in the evening on a daily basis, at for a week. Once leg edema is better controlled she may be able to go back to the prior Lasix dose.    Labs: 01/04/2016 Creatinine 0.79, BUN 13, Potassium 3.8, Sodium 134, EGFR>60 12/04/2015 Creatinine 0.67, BUN 12, Potassium 3.7, Sodium 138, EGFR >60 06/12/2015 Creatinine 0.90, BUN 11, Potassium 4.1, Sodium 140, EGFR >60  11/21/2014 Creatinine 1.05, BUN 13, Potassium 4.2, Sodium 136, EGFR 52->60   Recommendations:   Reinforced to limit low salt food choices to 2000 mg day and limiting fluid intake to < 2 liters per day. Encouraged to call for fluid symptoms.    Follow-up plan: ICM clinic phone appointment on 02/06/2016 and office appointment with Dr Domenic Polite on 02/07/2016.  Copy of ICM check sent to primary cardiologist and device physician to show updated fluid level returned to normal.   ICM trend: 01/09/2016      [name], RN 01/09/2016 9:04 AM

## 2016-01-11 NOTE — Progress Notes (Addendum)
Patient returned call.  She stated she has a PICC line and is receiving antibiotics for chronic urinary infection that was not resolved by oral anitibiotics.  She stated her fluid pills are more effective right now and has lost 7 lbs.  Current weight is 179 lbs.  Leg and ankle swelling has improved but not resolved.  Advised next remote transmission will be 02/06/2016 which is day before office visit with Dr Domenic Polite.

## 2016-02-06 ENCOUNTER — Ambulatory Visit (INDEPENDENT_AMBULATORY_CARE_PROVIDER_SITE_OTHER): Payer: Medicare Other

## 2016-02-06 ENCOUNTER — Encounter: Payer: Self-pay | Admitting: *Deleted

## 2016-02-06 ENCOUNTER — Telehealth: Payer: Self-pay

## 2016-02-06 DIAGNOSIS — Z95810 Presence of automatic (implantable) cardiac defibrillator: Secondary | ICD-10-CM

## 2016-02-06 DIAGNOSIS — I5042 Chronic combined systolic (congestive) and diastolic (congestive) heart failure: Secondary | ICD-10-CM | POA: Diagnosis not present

## 2016-02-06 NOTE — Telephone Encounter (Signed)
Remote ICM transmission received.  Attempted patient call and left detailed message regarding transmission and next ICM scheduled for 03/08/2016.  Advised to return call for any fluid symptoms or questions.

## 2016-02-06 NOTE — Progress Notes (Signed)
EPIC Encounter for ICM Monitoring  Patient Name: Melissa Paul is a 71 y.o. female Date: 02/06/2016 Primary Care Physican: Deloria Lair, MD Primary Cardiologist:McDowell Electrophysiologist: Allred Dry Weight:unknown Bi-V Pacing: 93%      Battery Longevity: 7.4 months                                                      Attempted ICM call and unable to reach.  Left detailed message regarding transmission.  Transmission reviewed.   Thoracic impedance normal but is suggesting some dryness since above baseline from 01/29/2016 to 02/05/2016.   Labs: 01/04/2016 Creatinine 0.79, BUN 13, Potassium 3.8, Sodium 134, EGFR>60 12/04/2015 Creatinine 0.67, BUN 12, Potassium 3.7, Sodium 138, EGFR >60 06/12/2015 Creatinine 0.90, BUN 11, Potassium 4.1, Sodium 140, EGFR >60  11/21/2014 Creatinine 1.05, BUN 13, Potassium 4.2, Sodium 136, EGFR 52->60   Recommendations:   Provided ICM number and encouraged to call for fluid symptoms.  Follow-up plan: ICM clinic phone appointment on 03/11/2016.  Office appointment with Dr Domenic Polite on 02/07/2016  Copy of ICM check sent to primary cardiologist and device physician.   3 month ICM trend: 02/06/2016   1 Year ICM trend:      [name], RN 02/06/2016 7:50 AM

## 2016-02-07 ENCOUNTER — Ambulatory Visit: Payer: Medicare Other | Admitting: Cardiology

## 2016-02-09 ENCOUNTER — Telehealth: Payer: Self-pay | Admitting: Cardiology

## 2016-02-09 NOTE — Telephone Encounter (Signed)
Can leave message if not home Would like to know if she still needs to do blood work. Stated she had some blood work done in December

## 2016-02-09 NOTE — Telephone Encounter (Signed)
Patient notified via voice mail.  Advised to call back if she does not have order already.

## 2016-02-09 NOTE — Telephone Encounter (Signed)
Yes, please go ahead and get a follow-up BMET just before her next visit.

## 2016-02-09 NOTE — Telephone Encounter (Signed)
Patient last seen 01/02/16 - BMET requested prior to next OV which is scheduled for 03/14/16.  She is questioning if she still needs to do lab prior to next OV as she had lab in December.

## 2016-03-11 ENCOUNTER — Ambulatory Visit (INDEPENDENT_AMBULATORY_CARE_PROVIDER_SITE_OTHER): Payer: Medicare Other | Admitting: *Deleted

## 2016-03-11 DIAGNOSIS — I5042 Chronic combined systolic (congestive) and diastolic (congestive) heart failure: Secondary | ICD-10-CM

## 2016-03-11 DIAGNOSIS — Z95810 Presence of automatic (implantable) cardiac defibrillator: Secondary | ICD-10-CM

## 2016-03-11 DIAGNOSIS — I428 Other cardiomyopathies: Secondary | ICD-10-CM

## 2016-03-11 NOTE — Progress Notes (Signed)
EPIC Encounter for ICM Monitoring  Patient Name: Melissa Paul is a 71 y.o. female Date: 03/11/2016 Primary Care Physican: Deloria Lair, MD Primary Cardiologist:McDowell Electrophysiologist: Allred Dry Weight:unknown Bi-V Pacing: 94% Battery Longevity: 5.1 months      Heart Failure questions reviewed, pt has symptoms but are not new, some leg swelling, fullness in chest and chronic shortness of breath.      Thoracic impedance normal with some days that have been abnormal suggesting fluid accumulation with longest episode 02/07/2016 to 02/17/2016.  Current prescribed dose of Furosemide 80 mg 1 tablet in am and 1/2 tablet in pm.    Labs: 01/04/2016 Creatinine 0.79, BUN 13, Potassium 3.8, Sodium 134, EGFR>60 12/04/2015 Creatinine 0.67, BUN 12, Potassium 3.7, Sodium 138, EGFR >60 06/12/2015 Creatinine 0.90, BUN 11, Potassium 4.1, Sodium 140, EGFR >60  10/31/2016Creatinine 1.05, BUN 13, Potassium 4.2, Sodium 136, EGFR 52->60   Recommendations: No changes. Reminded to limit dietary salt intake to 2000 mg/day and fluid intake to < 2 liters/day. Encouraged to call for fluid symptoms.  Follow-up plan: ICM clinic phone appointment on 04/11/2016.  Office appointment with Dr Domenic Polite 03/14/2016.  Copy of ICM check sent to primary cardiologist and device physician.   3 month ICM trend: 03/11/2016   1 Year ICM trend:      [name], RN 03/11/2016 11:54 AM

## 2016-03-12 ENCOUNTER — Encounter: Payer: Self-pay | Admitting: Cardiology

## 2016-03-12 LAB — CUP PACEART REMOTE DEVICE CHECK
Battery Remaining Longevity: 5 mo
Battery Remaining Percentage: 6 %
Battery Voltage: 2.63 V
Brady Statistic AP VP Percent: 86 %
Brady Statistic AP VS Percent: 1.7 %
Brady Statistic AS VP Percent: 5.9 %
Brady Statistic AS VS Percent: 1 %
Brady Statistic RA Percent Paced: 80 %
Date Time Interrogation Session: 20180217162401
HighPow Impedance: 80 Ohm
HighPow Impedance: 80 Ohm
Implantable Lead Implant Date: 20110912
Implantable Lead Implant Date: 20110912
Implantable Lead Implant Date: 20120710
Implantable Lead Location: 753858
Implantable Lead Location: 753859
Implantable Lead Location: 753860
Implantable Lead Model: 7122
Implantable Pulse Generator Implant Date: 20110912
Lead Channel Impedance Value: 340 Ohm
Lead Channel Impedance Value: 460 Ohm
Lead Channel Impedance Value: 530 Ohm
Lead Channel Pacing Threshold Amplitude: 0.5 V
Lead Channel Pacing Threshold Amplitude: 0.625 V
Lead Channel Pacing Threshold Amplitude: 0.75 V
Lead Channel Pacing Threshold Pulse Width: 0.5 ms
Lead Channel Pacing Threshold Pulse Width: 0.5 ms
Lead Channel Pacing Threshold Pulse Width: 0.5 ms
Lead Channel Sensing Intrinsic Amplitude: 11.8 mV
Lead Channel Sensing Intrinsic Amplitude: 4.1 mV
Lead Channel Setting Pacing Amplitude: 2 V
Lead Channel Setting Pacing Amplitude: 2 V
Lead Channel Setting Pacing Amplitude: 2 V
Lead Channel Setting Pacing Pulse Width: 0.5 ms
Lead Channel Setting Pacing Pulse Width: 0.5 ms
Lead Channel Setting Sensing Sensitivity: 0.5 mV
Pulse Gen Serial Number: 794739

## 2016-03-12 NOTE — Progress Notes (Signed)
Remote ICD transmission.   

## 2016-03-13 NOTE — Progress Notes (Signed)
Cardiology Office Note  Date: 03/14/2016   ID: Melissa Paul, DOB 01/11/1946, MRN [id number]  PCP: Deloria Lair, MD  Primary Cardiologist: Rozann Lesches, MD   Chief Complaint  Patient presents with  . Cardiomyopathy    History of Present Illness: Melissa Paul is a 71 y.o. female last seen in December 2017. She is here with her daughter for a follow-up visit. Continues to complain of intermittent ankle edema, also some chest discomfort at times. She has a known nonischemic cardiomyopathy with normal coronary arteries. She reports compliance with her medications, outlined below. She has not been on ACE inhibitor or ARB with low blood pressure at baseline, has been able to tolerate Coreg.  She continues to follow in the device clinic with Dr. [name], [employer] Jude biventricular ICD in place. Recent thoracic impedance was found to be normal, proceeded by abnormal level suggesting fluid accumulation up until late January. We did intensify her Lasix dose at the last visit. Follow-up lab work from December is outlined below.  We discussed salt restriction, basic walking regimen for exercise.  Past Medical History:  Diagnosis Date  . Chronic systolic heart failure ([place])   . Glucose intolerance (pre-diabetes)   . Hyperlipemia   . Hypothyroidism   . LBBB (left bundle branch block)   . Mitral regurgitation    Mild to Moderate  . Nonischemic cardiomyopathy ([place])    LVEF 35-40% by echo 8/13  . Paroxysmal atrial fibrillation ([facility])    Brief episodes by device interrogation  . UTI (urinary tract infection)    Recurrent  . Ventricular tachycardia ([place])    ICD therapy for VT    Past Surgical History:  Procedure Laterality Date  . APPENDECTOMY    . CARDIAC DEFIBRILLATOR PLACEMENT  09/2009   St. Jude BiV ICD by Dr. Sandria Manly class III  . CHOLECYSTECTOMY    . KNEE ARTHROSCOPY     Left  . LEFT AND RIGHT HEART CATHETERIZATION WITH CORONARY ANGIOGRAM N/A 04/12/2014   Procedure: LEFT  AND RIGHT HEART CATHETERIZATION WITH CORONARY ANGIOGRAM;  Surgeon: Leonie Man, MD;  Location: [facility] LAB;  Service: Cardiovascular;  Laterality: N/A;  . TOTAL ABDOMINAL HYSTERECTOMY     (R) ovary removed    Current Outpatient Prescriptions  Medication Sig Dispense Refill  . aspirin 81 MG tablet Take 1 tablet (81 mg total) by mouth daily. 30 tablet   . carvedilol (COREG) 12.5 MG tablet TAKE ONE TABLET BY MOUTH TWICE DAILY 180 tablet 3  . cetirizine (ZYRTEC) 10 MG tablet Take 10 mg by mouth daily as needed for allergies or rhinitis.     Marland Kitchen diazepam (VALIUM) 5 MG tablet Take one tablet every morning per patient    . furosemide (LASIX) 80 MG tablet Take 80 mg in the morning and 40 mg in the evening 135 tablet 1  . levothyroxine (SYNTHROID, LEVOTHROID) 75 MCG tablet Take 37.5 mcg by mouth daily.     . metFORMIN (GLUCOPHAGE) 500 MG tablet Take 500 mg by mouth 2 (two) times daily.    . ranitidine (ZANTAC) 150 MG capsule Take 150 mg by mouth daily.     Marland Kitchen spironolactone (ALDACTONE) 25 MG tablet Take 25 mg by mouth daily.      No current facility-administered medications for this visit.    Allergies:  Bactrim; Cephalexin; Clindamycin; Contrast media [iodinated diagnostic agents]; Latex; Levofloxacin; Nitrofurantoin monohyd macro; and Penicillins   Social History: The patient  reports that she quit smoking about 20 years  ago. Her smoking use included Cigarettes. She started smoking about 66 years ago. She has a 6.00 pack-year smoking history. She has never used smokeless tobacco. She reports that she does not drink alcohol or use drugs.   ROS:  Please see the history of present illness. Otherwise, complete review of systems is positive for intermittent cough.  All other systems are reviewed and negative.   Physical Exam: VS:  BP (!) 87/55   Pulse (!) 50   Ht 5\' 4"  (1.626 m)   Wt 182 lb 9.6 oz (82.8 kg)   SpO2 98%   BMI 31.34 kg/m , BMI Body mass index is 31.34 kg/m.  Wt Readings from  Last 3 Encounters:  03/14/16 182 lb 9.6 oz (82.8 kg)  01/04/16 185 lb (83.9 kg)  01/02/16 185 lb 3.2 oz (84 kg)    Overweight woman, appears comfortable at rest.  HEENT: Conjunctiva and lids normal, oropharynx clear with poor dentition. Neck: Supple, no elevated JVP, no carotid bruits, no thyromegaly.  Lungs: Clear to auscultation with decreased breath sounds, nonlabored breathing at rest.  Cardiac: Regular rate and rhythm, no S3 or significant systolic murmur, no pericardial rub.  Abdomen: Soft, nontender, bowel sounds present, no guarding or rebound.  Extremities: Mild lower leg edemaand adipose tissue, stasis. Skin: Warm and dry. Musculoskeletal: No kyphosis. Neuropsychiatric: Alert and oriented 3, affect appropriate.  ECG: I personally reviewed the tracing from 11/29/2015 which showed a ventricular paced rhythm with nonspecific ST changes.  Recent Labwork: 01/04/2016: BUN 13; Creatinine, Ser 0.79; Potassium 3.8; Sodium 134   Other Studies Reviewed Today:  Echocardiogram 03/24/2014: Study Conclusions  - Left ventricle: Systolic function was moderately reduced. Estimated LVEF 40%. Global hypokinesis was seen. The cavity size was normal. Wall thickness was increased in a pattern of mild LVH. Features are consistent with a pseudonormal left ventricular filling pattern, with concomitant abnormal relaxation and increased filling pressure (grade 2 diastolic dysfunction). Doppler parameters are consistent with high ventricular filling pressure. - Left atrium: The atrium was mildly dilated. Volume/bsa, ES, (1-plane Simpson&'s, A2C): 30.3 ml/m^2. - Right ventricle: Pacer wire or catheter noted in right ventricle. Systolic function was mildly reduced. - Right atrium: The atrium was mildly dilated. Pacer wire or catheter noted in right atrium. - Tricuspid valve: There was mild regurgitation.  Assessment and Plan:  1. Chronic by heart failure with LVEF 40%.  Plan to continue Coreg and current dose of Lasix as well as Aldactone. Sodium restriction discussed. Not on ACE inhibitor or ARB with low blood pressure at baseline.  2. Nonischemic cardiopathy with normal coronary arteries documented at cardiac catheterization 2016.  3. St. Jude biventricular ICD in place, followed by Dr. [name]. Recent thoracic impedance had normalized.  4. Prior history of VT, no device shocks or syncope.  Current medicines were reviewed with the patient today.  Disposition: Follow-up in 3 months.  Signed, Satira Sark, MD, [facility] 03/14/2016 10:47 AM    [facility] [place], [place], [place] [address] Phone: [phone]; Fax: [phone]

## 2016-03-14 ENCOUNTER — Encounter: Payer: Self-pay | Admitting: Cardiology

## 2016-03-14 ENCOUNTER — Ambulatory Visit (INDEPENDENT_AMBULATORY_CARE_PROVIDER_SITE_OTHER): Payer: Medicare Other | Admitting: Cardiology

## 2016-03-14 VITALS — BP 87/55 | HR 50 | Ht 64.0 in | Wt 182.6 lb

## 2016-03-14 DIAGNOSIS — I428 Other cardiomyopathies: Secondary | ICD-10-CM

## 2016-03-14 DIAGNOSIS — I5042 Chronic combined systolic (congestive) and diastolic (congestive) heart failure: Secondary | ICD-10-CM

## 2016-03-14 DIAGNOSIS — Z95810 Presence of automatic (implantable) cardiac defibrillator: Secondary | ICD-10-CM | POA: Diagnosis not present

## 2016-03-14 DIAGNOSIS — I472 Ventricular tachycardia, unspecified: Secondary | ICD-10-CM

## 2016-03-14 NOTE — Patient Instructions (Signed)
Your physician recommends that you schedule a follow-up appointment in: 3 months with Dr. McDowell  Your physician recommends that you continue on your current medications as directed. Please refer to the Current Medication list given to you today.  Thank you for choosing [facility]!!    

## 2016-03-29 NOTE — Progress Notes (Signed)
ICM remote transmission rescheduled from 04/11/2016 to 04/18/2016.

## 2016-04-05 ENCOUNTER — Telehealth: Payer: Self-pay | Admitting: Cardiology

## 2016-04-05 NOTE — Telephone Encounter (Signed)
Spoke w/ pt and requested that she send a manual transmission b/c her home monitor has not updated in at least 7 days.   

## 2016-04-18 ENCOUNTER — Telehealth: Payer: Self-pay | Admitting: Cardiology

## 2016-04-18 ENCOUNTER — Ambulatory Visit (INDEPENDENT_AMBULATORY_CARE_PROVIDER_SITE_OTHER): Payer: Medicare Other

## 2016-04-18 DIAGNOSIS — Z95810 Presence of automatic (implantable) cardiac defibrillator: Secondary | ICD-10-CM

## 2016-04-18 DIAGNOSIS — I5042 Chronic combined systolic (congestive) and diastolic (congestive) heart failure: Secondary | ICD-10-CM | POA: Diagnosis not present

## 2016-04-18 NOTE — Progress Notes (Signed)
EPIC Encounter for ICM Monitoring  Patient Name: Melissa Paul is a 71 y.o. female Date: 04/18/2016 Primary Care Physican: Deloria Lair, MD Primary Cardiologist:McDowell Electrophysiologist: Allred Dry Weight:unknown Bi-V Pacing: 91% Battery Longevity: 3.9 months       Heart Failure questions reviewed, pt asymptomatic.   Thoracic impedance normal.  Labs: 01/04/2016 Creatinine 0.79, BUN 13, Potassium 3.8, Sodium 134, EGFR>60 12/04/2015 Creatinine 0.67, BUN 12, Potassium 3.7, Sodium 138, EGFR >60 06/12/2015 Creatinine 0.90, BUN 11, Potassium 4.1, Sodium 140, EGFR >60  10/31/2016Creatinine 1.05, BUN 13, Potassium 4.2, Sodium 136, EGFR 52->60   Recommendations: No changes. Reminded to limit dietary salt intake to 2000 mg/day and fluid intake to < 2 liters/day. Encouraged to call for fluid symptoms.  Follow-up plan: ICM clinic phone appointment on 05/21/2016.  Copy of ICM check sent to device physician.   3 month ICM trend: 04/18/2016   1 Year ICM trend:      [name], RN 04/18/2016 4:41 PM

## 2016-04-18 NOTE — Telephone Encounter (Signed)
Spoke with pt and reminded pt of remote transmission that is due today. Pt verbalized understanding.   

## 2016-05-21 ENCOUNTER — Telehealth: Payer: Self-pay | Admitting: Cardiology

## 2016-05-21 ENCOUNTER — Ambulatory Visit (INDEPENDENT_AMBULATORY_CARE_PROVIDER_SITE_OTHER): Payer: Medicare Other

## 2016-05-21 DIAGNOSIS — Z95810 Presence of automatic (implantable) cardiac defibrillator: Secondary | ICD-10-CM | POA: Diagnosis not present

## 2016-05-21 DIAGNOSIS — I5042 Chronic combined systolic (congestive) and diastolic (congestive) heart failure: Secondary | ICD-10-CM

## 2016-05-21 NOTE — Telephone Encounter (Signed)
Spoke with pt and reminded pt of remote transmission that is due today. Pt verbalized understanding.   

## 2016-05-23 NOTE — Progress Notes (Signed)
EPIC Encounter for ICM Monitoring  Patient Name: Melissa Paul is a 71 y.o. female Date: 05/23/2016 Primary Care Physican: Deloria Lair, MD Primary Cardiologist:McDowell Electrophysiologist: Allred Dry Weight:unknown Bi-V Pacing: 92% Battery Longevity: 3.23month      Heart Failure questions reviewed, pt has not had any changes.  She reported she gets short of breath when she bends over and stays weak   Thoracic impedance normal but was abnormal 04/26/2016 to 05/03/2016 and she took extra 1/2 tablet of Furosemide for increase in SOB during that time.   Prescribed dosage: Furosemide 80 mg in morning and 40 mg in evening  Labs: 01/04/2016 Creatinine 0.79, BUN 13, Potassium 3.8, Sodium 134, EGFR>60 12/04/2015 Creatinine 0.67, BUN 12, Potassium 3.7, Sodium 138, EGFR >60 06/12/2015 Creatinine 0.90, BUN 11, Potassium 4.1, Sodium 140, EGFR >60  10/31/2016Creatinine 1.05, BUN 13, Potassium 4.2, Sodium 136, EGFR 52->60  Recommendations: No changes. Encouraged to call for fluid symptoms or use local ER for any urgent symptoms.  Follow-up plan: ICM clinic phone appointment on 06/10/2016.  Office appointment scheduled on 06/13/2016 with Dr MDomenic Polite  Copy of ICM check sent to device physician.   3 month ICM trend: 05/21/2016   1 Year ICM trend:      LRosalene Billings RN 05/23/2016 3:35 PM   '

## 2016-06-10 ENCOUNTER — Ambulatory Visit (INDEPENDENT_AMBULATORY_CARE_PROVIDER_SITE_OTHER): Payer: Medicare Other | Admitting: *Deleted

## 2016-06-10 DIAGNOSIS — I5042 Chronic combined systolic (congestive) and diastolic (congestive) heart failure: Secondary | ICD-10-CM | POA: Diagnosis not present

## 2016-06-10 DIAGNOSIS — I428 Other cardiomyopathies: Secondary | ICD-10-CM

## 2016-06-10 DIAGNOSIS — Z95810 Presence of automatic (implantable) cardiac defibrillator: Secondary | ICD-10-CM

## 2016-06-10 NOTE — Progress Notes (Signed)
EPIC Encounter for ICM Monitoring  Patient Name: Melissa Paul is a 71 y.o. female Date: 06/10/2016 Primary Care Physican: Deloria Lair., MD Primary Cardiologist:McDowell Electrophysiologist: Allred Dry Weight:unknown Bi-V Pacing: 92% Battery Longevity: <31month      Heart Failure questions reviewed, pt reported chronic swelling in feet and legs which never seem to improve.    Thoracic impedance normal.  Prescribed dosage: Furosemide 80 mg in morning and 40 mg in evening  Labs: 01/04/2016 Creatinine 0.79, BUN 13, Potassium 3.8, Sodium 134, EGFR>60 12/04/2015 Creatinine 0.67, BUN 12, Potassium 3.7, Sodium 138, EGFR >60 06/12/2015 Creatinine 0.90, BUN 11, Potassium 4.1, Sodium 140, EGFR >60  10/31/2016Creatinine 1.05, BUN 13, Potassium 4.2, Sodium 136, EGFR 52->60  Recommendations: No changes.   Encouraged to call for fluid symptoms or use local ER for any urgent symptoms.  Follow-up plan: ICM clinic phone appointment on 07/04/2016.  Office appointment scheduled on 06/13/2016 with Dr MDomenic Polite  Copy of ICM check sent to primary cardiologist and device physician.   3 month ICM trend: 06/10/2016   1 Year ICM trend:      LRosalene Billings RN 06/10/2016 5:10 PM

## 2016-06-11 NOTE — Progress Notes (Signed)
Remote ICD transmission.   

## 2016-06-12 ENCOUNTER — Encounter: Payer: Self-pay | Admitting: Cardiology

## 2016-06-12 NOTE — Progress Notes (Signed)
Cardiology Office Note  Date: 06/13/2016   ID: Melissa Paul, DOB Jun 24, 1945, MRN [id number]  PCP: Deloria Lair., MD  Primary Cardiologist: Rozann Lesches, MD   Chief Complaint  Patient presents with  . Cardiomyopathy    History of Present Illness: Melissa Paul is a 71 y.o. female last seen in February.She is here with her daughter for a follow-up visit. Reports no overall major change. Has been concerned about lower leg swelling and venous varicosities.  She continues to follow with Dr. [name] in the device clinic, [place] biventricular ICD in place. Recent thoracic impedance check was normal.  She has not been on ACE inhibitor or ARB with low blood pressure at baseline, has been able to tolerate Coreg and Aldactone. Lasix dose is unchanged.  Past Medical History:  Diagnosis Date  . Chronic systolic heart failure ([place])   . Glucose intolerance (pre-diabetes)   . Hyperlipemia   . Hypothyroidism   . LBBB (left bundle branch block)   . Mitral regurgitation    Mild to Moderate  . Nonischemic cardiomyopathy ([place])    LVEF 35-40% by echo 8/13  . Paroxysmal atrial fibrillation ([facility])    Brief episodes by device interrogation  . UTI (urinary tract infection)    Recurrent  . Ventricular tachycardia ([place])    ICD therapy for VT    Past Surgical History:  Procedure Laterality Date  . APPENDECTOMY    . CARDIAC DEFIBRILLATOR PLACEMENT  09/2009   St. Jude BiV ICD by Dr. Sandria Manly class III  . CHOLECYSTECTOMY    . KNEE ARTHROSCOPY     Left  . LEFT AND RIGHT HEART CATHETERIZATION WITH CORONARY ANGIOGRAM N/A 04/12/2014   Procedure: LEFT AND RIGHT HEART CATHETERIZATION WITH CORONARY ANGIOGRAM;  Surgeon: Leonie Man, MD;  Location: [facility] CATH LAB;  Service: Cardiovascular;  Laterality: N/A;  . TOTAL ABDOMINAL HYSTERECTOMY     (R) ovary removed    Current Outpatient Prescriptions  Medication Sig Dispense Refill  . aspirin 81 MG tablet Take 1 tablet (81 mg total) by  mouth daily. 30 tablet   . carvedilol (COREG) 12.5 MG tablet TAKE ONE TABLET BY MOUTH TWICE DAILY 180 tablet 3  . cetirizine (ZYRTEC) 10 MG tablet Take 10 mg by mouth daily as needed for allergies or rhinitis.     Marland Kitchen diazepam (VALIUM) 5 MG tablet Take one tablet every morning per patient    . furosemide (LASIX) 80 MG tablet Take 80 mg in the morning and 40 mg in the evening 135 tablet 1  . levothyroxine (SYNTHROID, LEVOTHROID) 75 MCG tablet Take 37.5 mcg by mouth daily.     . metFORMIN (GLUCOPHAGE) 500 MG tablet Take 500 mg by mouth 2 (two) times daily.    . ranitidine (ZANTAC) 150 MG capsule Take 150 mg by mouth daily.     Marland Kitchen spironolactone (ALDACTONE) 25 MG tablet Take 25 mg by mouth daily.      No current facility-administered medications for this visit.    Allergies:  Bactrim; Cephalexin; Clindamycin; Contrast media [iodinated diagnostic agents]; Latex; Levofloxacin; Nitrofurantoin monohyd macro; and Penicillins   Social History: The patient  reports that she quit smoking about 20 years ago. Her smoking use included Cigarettes. She started smoking about 66 years ago. She has a 6.00 pack-year smoking history. She has never used smokeless tobacco. She reports that she does not drink alcohol or use drugs.   ROS:  Please see the history of present illness. Otherwise, complete review  of systems is positive for none.  All other systems are reviewed and negative.   Physical Exam: VS:  BP 98/60   Pulse 70   Ht 5\' 4"  (1.626 m)   Wt 188 lb (85.3 kg)   SpO2 98%   BMI 32.27 kg/m , BMI Body mass index is 32.27 kg/m.  Wt Readings from Last 3 Encounters:  06/13/16 188 lb (85.3 kg)  03/14/16 182 lb 9.6 oz (82.8 kg)  01/04/16 185 lb (83.9 kg)    Overweight woman, appears comfortable at rest.  HEENT: Conjunctiva and lids normal, oropharynx clear with poor dentition. Neck: Supple, no elevated JVP, no carotid bruits, no thyromegaly.  Lungs: Clear to auscultation with decreased breath sounds,  nonlabored breathing at rest.  Cardiac: Regular rate and rhythm, no S3 or significant systolic murmur, no pericardial rub.  Abdomen: Soft, nontender, bowel sounds present, no guarding or rebound.  Extremities: Mild lower leg edemaand venous varicosities. Skin: Warm and dry. Musculoskeletal: No kyphosis. Neuropsychiatric: Alert and oriented 3, affect appropriate.  ECG: I personally reviewed the tracing from 11/29/2015 which showed a ventricular paced rhythm with nonspecific ST changes.  Recent Labwork: 01/04/2016: BUN 13; Creatinine, Ser 0.79; Potassium 3.8; Sodium 134   Other Studies Reviewed Today:  Echocardiogram 03/24/2014: Study Conclusions  - Left ventricle: Systolic function was moderately reduced. Estimated LVEF 40%. Global hypokinesis was seen. The cavity size was normal. Wall thickness was increased in a pattern of mild LVH. Features are consistent with a pseudonormal left ventricular filling pattern, with concomitant abnormal relaxation and increased filling pressure (grade 2 diastolic dysfunction). Doppler parameters are consistent with high ventricular filling pressure. - Left atrium: The atrium was mildly dilated. Volume/bsa, ES, (1-plane Simpson&'s, A2C): 30.3 ml/m^2. - Right ventricle: Pacer wire or catheter noted in right ventricle. Systolic function was mildly reduced. - Right atrium: The atrium was mildly dilated. Pacer wire or catheter noted in right atrium. - Tricuspid valve: There was mild regurgitation.  Assessment and Plan:  1. Chronic combined heart failure with LVEF approximately 40%. Overall volume status is stable, recent thoracic impedance noted. No changes made present diuretic regimen.  2. Nonischemic cardiopathy myopathy with normal coronary arteries by cardiac catheterization 2016.  3. Mild leg edema and venous varicosities. We will try low level below the knee compression stockings.  4. St. Jude biventricular ICD in  place, followed in the device clinic by Dr. [name]. No device discharges. She does have a history of VT.  Current medicines were reviewed with the patient today.  Disposition: Follow-up in 4 months.  Signed, Satira Sark, MD, [facility] 06/13/2016 11:20 AM    [facility] [place], [place], [place] [address] Phone: [phone]; Fax: [phone]

## 2016-06-13 ENCOUNTER — Ambulatory Visit (INDEPENDENT_AMBULATORY_CARE_PROVIDER_SITE_OTHER): Payer: Medicare Other | Admitting: Cardiology

## 2016-06-13 ENCOUNTER — Encounter: Payer: Self-pay | Admitting: Cardiology

## 2016-06-13 VITALS — BP 98/60 | HR 70 | Ht 64.0 in | Wt 188.0 lb

## 2016-06-13 DIAGNOSIS — I428 Other cardiomyopathies: Secondary | ICD-10-CM | POA: Diagnosis not present

## 2016-06-13 DIAGNOSIS — Z95810 Presence of automatic (implantable) cardiac defibrillator: Secondary | ICD-10-CM

## 2016-06-13 DIAGNOSIS — I5042 Chronic combined systolic (congestive) and diastolic (congestive) heart failure: Secondary | ICD-10-CM | POA: Diagnosis not present

## 2016-06-13 DIAGNOSIS — R600 Localized edema: Secondary | ICD-10-CM | POA: Diagnosis not present

## 2016-06-13 LAB — CUP PACEART REMOTE DEVICE CHECK
Battery Remaining Longevity: 1 mo
Battery Remaining Percentage: 2 %
Battery Voltage: 2.6 V
Brady Statistic AP VP Percent: 87 %
Brady Statistic AP VS Percent: 2 %
Brady Statistic AS VP Percent: 5.2 %
Brady Statistic AS VS Percent: 1 %
Brady Statistic RA Percent Paced: 82 %
Date Time Interrogation Session: 20180521200622
HighPow Impedance: 77 Ohm
HighPow Impedance: 77 Ohm
Implantable Lead Implant Date: 20110912
Implantable Lead Implant Date: 20110912
Implantable Lead Implant Date: 20120710
Implantable Lead Location: 753858
Implantable Lead Location: 753859
Implantable Lead Location: 753860
Implantable Lead Model: 7122
Implantable Pulse Generator Implant Date: 20110912
Lead Channel Impedance Value: 360 Ohm
Lead Channel Impedance Value: 530 Ohm
Lead Channel Impedance Value: 590 Ohm
Lead Channel Pacing Threshold Amplitude: 0.5 V
Lead Channel Pacing Threshold Amplitude: 0.5 V
Lead Channel Pacing Threshold Amplitude: 0.75 V
Lead Channel Pacing Threshold Pulse Width: 0.5 ms
Lead Channel Pacing Threshold Pulse Width: 0.5 ms
Lead Channel Pacing Threshold Pulse Width: 0.5 ms
Lead Channel Sensing Intrinsic Amplitude: 12 mV
Lead Channel Sensing Intrinsic Amplitude: 4.7 mV
Lead Channel Setting Pacing Amplitude: 2 V
Lead Channel Setting Pacing Amplitude: 2 V
Lead Channel Setting Pacing Amplitude: 2 V
Lead Channel Setting Pacing Pulse Width: 0.5 ms
Lead Channel Setting Pacing Pulse Width: 0.5 ms
Lead Channel Setting Sensing Sensitivity: 0.5 mV
Pulse Gen Serial Number: 794739

## 2016-06-13 NOTE — Patient Instructions (Signed)
Medication Instructions:  Your physician recommends that you continue on your current medications as directed. Please refer to the Current Medication list given to you today.  Labwork: NONE  Testing/Procedures: NONE  Follow-Up: Your physician recommends that you schedule a follow-up appointment in: [place]  Any Other Special Instructions Will Be Listed Below (If Applicable). Your physician wants you to wear compression stockings  If you need a refill on your cardiac medications before your next appointment, please call your pharmacy.

## 2016-06-28 ENCOUNTER — Telehealth: Payer: Self-pay | Admitting: Nurse Practitioner

## 2016-06-28 NOTE — Telephone Encounter (Signed)
Spoke with patient and have scheduled her for generator change out on 07/11/16 at 1:30pm.  She is to be at the hospital at 11:30am.  NPO after midnight.  She will have labs done at the hospital.  Aware will need someone to drive home after procedure.

## 2016-06-28 NOTE — Telephone Encounter (Signed)
Pt called and stated that someone told her that she would be getting a call today at 8:30 AM to schedule procedure and not to eat. After consulting w/ MD Nurse I informed pt that MD nurse will be in touch with her and that her procedure will not be done today and she can go ahead and eat and take her medicines. Pt verbalized understanding.

## 2016-06-28 NOTE — Telephone Encounter (Signed)
[facility] rep checked patient at [facility] last night. Device at [employer]. Otherwise normal device function. Not a recall device. Will need elective change out scheduled.  Per Dr Jackalyn Lombard last note, no need for repeat office visit prior to gen change. Will send message to [facility] to schedule gen change at next available time.   Chanetta Marshall, NP 06/28/2016 7:34 AM

## 2016-07-02 ENCOUNTER — Telehealth: Payer: Self-pay | Admitting: Cardiology

## 2016-07-02 NOTE — Telephone Encounter (Signed)
Patient is going to send a remote to make sure it is still functioning properly. She has an advisory device and she reached appropriate ERI last week. She stated that she is having a harder time breathing than usual. Informed her that once the transmission is received a Device Clinic RN will review and call her back. Pt verbalized understanding.

## 2016-07-02 NOTE — Telephone Encounter (Signed)
No audible distress, difficulty breathing/talking during phone conversation.

## 2016-07-02 NOTE — Telephone Encounter (Signed)
Transmission received, no changes in BiV pacing, device function, PVC burden improved. Corvue does not indicate fluid overload, patient reports weight loss. She says that she hadn't felt the vibratory alert from her ICD recently and was worried- I informed her that it will "time out" and cease to vibrate. She says that she had a rough weekend d/t her daughter having health concerns and has been stressed. She says that she has some chest tightness and SOB upon waking this morning- I have advised her that she should seek treatment in the ER due to the correlation of these symptoms. She wants to see how the day goes now that she knows her ICD is functioning properly. I again advised her that she should seek treatment in the ER or call 911 due to chest tightness and difficulty breathing. She verbalizes understanding.

## 2016-07-08 ENCOUNTER — Ambulatory Visit (INDEPENDENT_AMBULATORY_CARE_PROVIDER_SITE_OTHER): Payer: Medicare Other

## 2016-07-08 DIAGNOSIS — Z95810 Presence of automatic (implantable) cardiac defibrillator: Secondary | ICD-10-CM

## 2016-07-08 DIAGNOSIS — I5042 Chronic combined systolic (congestive) and diastolic (congestive) heart failure: Secondary | ICD-10-CM | POA: Diagnosis not present

## 2016-07-08 NOTE — Progress Notes (Signed)
EPIC Encounter for ICM Monitoring  Patient Name: Melissa Paul is a 71 y.o. female Date: 07/08/2016 Primary Care Physican: Deloria Lair., MD Primary Cardiologist:McDowell Electrophysiologist: Allred Dry Weight:179 lbs Bi-V Pacing: 92% Battery Longevity: Battery change scheduled for 07/11/2016      Heart Failure questions reviewed, pt asymptomatic.  Patient stated the defibrillator will be replaced by pacemaker.    Thoracic impedance normal but was abnormal suggesting fluid accumulation from 06/13/2016 to 06/22/2016.   Impedance above baseline from 06/22/2016 to 06/27/2016 to 07/08/2016 suggesting dryness.   Prescribed dosage: Furosemide 80 mg in morning and 40 mg in evening  Labs: 01/04/2016 Creatinine 0.79, BUN 13, Potassium 3.8, Sodium 134, EGFR>60 12/04/2015 Creatinine 0.67, BUN 12, Potassium 3.7, Sodium 138, EGFR >60 06/12/2015 Creatinine 0.90, BUN 11, Potassium 4.1, Sodium 140, EGFR >60  10/31/2016Creatinine 1.05, BUN 13, Potassium 4.2, Sodium 136, EGFR 52->60  Recommendations: No changes.   Encouraged to call for fluid symptoms.  Follow-up plan: ICM clinic phone appointment on 09/10/2016 due to patient is scheduled for battery change on 07/11/2016.  Copy of ICM check sent to device physician.   3 month ICM trend: 07/08/2016   1 Year ICM trend:      [name], RN 07/08/2016 7:56 AM

## 2016-07-11 ENCOUNTER — Encounter (HOSPITAL_COMMUNITY)
Admission: RE | Disposition: A | Discharge status: Home / Self Care | Payer: Self-pay | Source: Ambulatory Visit | Attending: Internal Medicine

## 2016-07-11 ENCOUNTER — Ambulatory Visit (HOSPITAL_COMMUNITY)
Admission: RE | Admit: 2016-07-11 | Discharge: 2016-07-11 | Disposition: A | Discharge status: Home / Self Care | Payer: Medicare Other | Source: Ambulatory Visit | Attending: Internal Medicine | Admitting: Internal Medicine

## 2016-07-11 DIAGNOSIS — Z87891 Personal history of nicotine dependence: Secondary | ICD-10-CM | POA: Insufficient documentation

## 2016-07-11 DIAGNOSIS — Z91041 Radiographic dye allergy status: Secondary | ICD-10-CM | POA: Diagnosis not present

## 2016-07-11 DIAGNOSIS — Z91040 Latex allergy status: Secondary | ICD-10-CM | POA: Insufficient documentation

## 2016-07-11 DIAGNOSIS — I428 Other cardiomyopathies: Secondary | ICD-10-CM | POA: Insufficient documentation

## 2016-07-11 DIAGNOSIS — Z7984 Long term (current) use of oral hypoglycemic drugs: Secondary | ICD-10-CM | POA: Insufficient documentation

## 2016-07-11 DIAGNOSIS — I340 Nonrheumatic mitral (valve) insufficiency: Secondary | ICD-10-CM | POA: Insufficient documentation

## 2016-07-11 DIAGNOSIS — E039 Hypothyroidism, unspecified: Secondary | ICD-10-CM | POA: Insufficient documentation

## 2016-07-11 DIAGNOSIS — I472 Ventricular tachycardia: Secondary | ICD-10-CM | POA: Diagnosis not present

## 2016-07-11 DIAGNOSIS — Z880 Allergy status to penicillin: Secondary | ICD-10-CM | POA: Diagnosis not present

## 2016-07-11 DIAGNOSIS — I429 Cardiomyopathy, unspecified: Secondary | ICD-10-CM

## 2016-07-11 DIAGNOSIS — I480 Paroxysmal atrial fibrillation: Secondary | ICD-10-CM | POA: Insufficient documentation

## 2016-07-11 DIAGNOSIS — R7303 Prediabetes: Secondary | ICD-10-CM | POA: Diagnosis not present

## 2016-07-11 DIAGNOSIS — Z7982 Long term (current) use of aspirin: Secondary | ICD-10-CM | POA: Diagnosis not present

## 2016-07-11 DIAGNOSIS — I447 Left bundle-branch block, unspecified: Secondary | ICD-10-CM | POA: Diagnosis not present

## 2016-07-11 DIAGNOSIS — Z8249 Family history of ischemic heart disease and other diseases of the circulatory system: Secondary | ICD-10-CM | POA: Insufficient documentation

## 2016-07-11 DIAGNOSIS — E785 Hyperlipidemia, unspecified: Secondary | ICD-10-CM | POA: Insufficient documentation

## 2016-07-11 DIAGNOSIS — Z4502 Encounter for adjustment and management of automatic implantable cardiac defibrillator: Secondary | ICD-10-CM | POA: Insufficient documentation

## 2016-07-11 DIAGNOSIS — I5022 Chronic systolic (congestive) heart failure: Secondary | ICD-10-CM | POA: Diagnosis not present

## 2016-07-11 HISTORY — PX: BIV ICD GENERATOR CHANGEOUT: EP1194

## 2016-07-11 LAB — BASIC METABOLIC PANEL
Anion gap: 11 (ref 5–15)
BUN: 9 mg/dL (ref 6–20)
CO2: 31 mmol/L (ref 22–32)
Calcium: 9.3 mg/dL (ref 8.9–10.3)
Chloride: 97 mmol/L — ABNORMAL LOW (ref 101–111)
Creatinine, Ser: 1.02 mg/dL — ABNORMAL HIGH (ref 0.44–1.00)
GFR calc Af Amer: >60 mL/min (ref >60)
GFR calc non Af Amer: 54 mL/min — ABNORMAL LOW (ref >60)
Glucose, Bld: 106 mg/dL — ABNORMAL HIGH (ref 65–99)
Potassium: 4.3 mmol/L (ref 3.5–5.1)
Sodium: 139 mmol/L (ref 135–145)

## 2016-07-11 LAB — CBC
HCT: 40.7 % (ref 36.0–46.0)
Hemoglobin: 14 g/dL (ref 12.0–15.0)
MCH: 30.8 pg (ref 26.0–34.0)
MCHC: 34.4 g/dL (ref 30.0–36.0)
MCV: 89.5 fL (ref 78.0–100.0)
Platelets: 247 10*3/uL (ref 150–400)
RBC: 4.55 MIL/uL (ref 3.87–5.11)
RDW: 13.5 % (ref 11.5–15.5)
WBC: 8.9 10*3/uL (ref 4.0–10.5)

## 2016-07-11 LAB — SURGICAL PCR SCREEN
MRSA, PCR: NEGATIVE
Staphylococcus aureus: NEGATIVE

## 2016-07-11 LAB — GLUCOSE, CAPILLARY: Glucose-Capillary: 73 mg/dL (ref 65–99)

## 2016-07-11 SURGERY — BIV ICD GENERATOR CHANGEOUT

## 2016-07-11 MED ORDER — MIDAZOLAM HCL 5 MG/5ML IJ SOLN
INTRAMUSCULAR | Status: DC | PRN
Start: 2016-07-11 — End: 2016-07-11
  Administered 2016-07-11 (×2): 1 mg via INTRAVENOUS

## 2016-07-11 MED ORDER — GENTAMICIN SULFATE 40 MG/ML IJ SOLN
80.0000 mg | INTRAMUSCULAR | Status: AC
  Administered 2016-07-11: 80 mg

## 2016-07-11 MED ORDER — VANCOMYCIN HCL IN DEXTROSE 1-5 GM/200ML-% IV SOLN
1000.0000 mg | INTRAVENOUS | Status: AC
Start: 2016-07-11 — End: 2016-07-11
  Administered 2016-07-11: 1000 mg via INTRAVENOUS

## 2016-07-11 MED ORDER — SODIUM CHLORIDE 0.9 % IR SOLN
Status: AC
  Filled 2016-07-11: qty 2

## 2016-07-11 MED ORDER — LIDOCAINE HCL (PF) 1 % IJ SOLN
INTRAMUSCULAR | Status: AC
  Filled 2016-07-11: qty 30

## 2016-07-11 MED ORDER — MUPIROCIN 2 % EX OINT
TOPICAL_OINTMENT | CUTANEOUS | Status: AC
  Administered 2016-07-11: 1
  Filled 2016-07-11: qty 22

## 2016-07-11 MED ORDER — VANCOMYCIN HCL IN DEXTROSE 1-5 GM/200ML-% IV SOLN
INTRAVENOUS | Status: AC
  Filled 2016-07-11: qty 200

## 2016-07-11 MED ORDER — SODIUM CHLORIDE 0.9% FLUSH: 3.0000 mL | Freq: Two times a day (BID) | INTRAVENOUS | Status: DC

## 2016-07-11 MED ORDER — LIDOCAINE HCL (PF) 1 % IJ SOLN
INTRAMUSCULAR | Status: AC
  Filled 2016-07-11: qty 60

## 2016-07-11 MED ORDER — LIDOCAINE HCL (PF) 1 % IJ SOLN
INTRAMUSCULAR | Status: DC | PRN
  Administered 2016-07-11: 45 mL

## 2016-07-11 MED ORDER — ONDANSETRON HCL 4 MG/2ML IJ SOLN: 4.0000 mg | Freq: Four times a day (QID) | INTRAMUSCULAR | Status: DC | PRN

## 2016-07-11 MED ORDER — SODIUM CHLORIDE 0.9 % IV SOLN
INTRAVENOUS | Status: DC
  Administered 2016-07-11: 12:00:00 via INTRAVENOUS

## 2016-07-11 MED ORDER — CHLORHEXIDINE GLUCONATE 4 % EX LIQD: 60.0000 mL | Freq: Once | CUTANEOUS | Status: DC

## 2016-07-11 MED ORDER — MIDAZOLAM HCL 5 MG/5ML IJ SOLN
INTRAMUSCULAR | Status: AC
  Filled 2016-07-11: qty 5

## 2016-07-11 MED ORDER — SODIUM CHLORIDE 0.9 % IV SOLN: 250.0000 mL | INTRAVENOUS | Status: DC | PRN

## 2016-07-11 MED ORDER — FENTANYL CITRATE (PF) 100 MCG/2ML IJ SOLN
INTRAMUSCULAR | Status: DC | PRN
  Administered 2016-07-11: 25 ug via INTRAVENOUS

## 2016-07-11 MED ORDER — SODIUM CHLORIDE 0.9% FLUSH: 3.0000 mL | INTRAVENOUS | Status: DC | PRN

## 2016-07-11 MED ORDER — FENTANYL CITRATE (PF) 100 MCG/2ML IJ SOLN
INTRAMUSCULAR | Status: AC
  Filled 2016-07-11: qty 2

## 2016-07-11 MED ORDER — ACETAMINOPHEN 325 MG PO TABS: 325.0000 mg | ORAL_TABLET | ORAL | Status: DC | PRN

## 2016-07-11 SURGICAL SUPPLY — 4 items
CABLE SURGICAL S-101-97-12 (CABLE) ×3 IMPLANT
ICD UNIFY ASUR CRT CD3357-40Q (ICD Generator) ×3 IMPLANT
PAD DEFIB LIFELINK (PAD) ×3 IMPLANT
TRAY PACEMAKER INSERTION (PACKS) ×3 IMPLANT

## 2016-07-11 NOTE — H&P (Addendum)
CC: CHF   History of Present Illness: Melissa Paul is a 71 y.o. female who presents today for BiV ICD generator change.   She is doing reasonably well.  She has had no recent ventricular arrhythmias.  Today, she denies symptoms of palpitations, chest pain, shortness of breath, orthopnea, PND, claudication, dizziness, presyncope, syncope, bleeding, or neurologic sequela. The patient is tolerating medications without difficulties and is otherwise without complaint today.        Past Medical History:  Diagnosis Date  . Chronic systolic heart failure ([place])   . Glucose intolerance (pre-diabetes)   . Hyperlipemia   . Hypothyroidism   . LBBB (left bundle branch block)   . Mitral regurgitation    Mild to Moderate  . Nonischemic cardiomyopathy ([place])    LVEF 35-40% by echo 8/13  . Paroxysmal atrial fibrillation (HCC)    Brief episodes by device interrogation  . UTI (urinary tract infection)    Recurrent  . Ventricular tachycardia ([place])    ICD therapy for VT        Past Surgical History:  Procedure Laterality Date  . APPENDECTOMY    . CARDIAC DEFIBRILLATOR PLACEMENT  09/2009   St. Jude BiV ICD by Dr. Sandria Manly class III  . CHOLECYSTECTOMY    . KNEE ARTHROSCOPY     Left  . LEFT AND RIGHT HEART CATHETERIZATION WITH CORONARY ANGIOGRAM N/A 04/12/2014   Procedure: LEFT AND RIGHT HEART CATHETERIZATION WITH CORONARY ANGIOGRAM;  Surgeon: Leonie Man, MD;  Location: [facility] CATH LAB;  Service: Cardiovascular;  Laterality: N/A;  . TOTAL ABDOMINAL HYSTERECTOMY     (R) ovary removed           Current Outpatient Prescriptions  Medication Sig Dispense Refill  . aspirin 81 MG tablet Take 1 tablet (81 mg total) by mouth daily. 30 tablet   . carvedilol (COREG) 12.5 MG tablet TAKE ONE TABLET BY MOUTH TWICE DAILY 180 tablet 3  . cetirizine (ZYRTEC) 10 MG tablet Take 10 mg by mouth daily as needed for allergies or rhinitis.     Marland Kitchen diazepam (VALIUM) 5 MG tablet  Take one tablet every morning per patient    . digoxin (LANOXIN) 0.125 MG tablet Take 1 tablet by mouth daily.     . furosemide (LASIX) 80 MG tablet Take 80 mg in the morning and 40 mg in the evening every other day (Patient taking differently: Take 80 mg in the morning and an additional 40 mg as needed for fluid retention) 45 tablet 6  . levothyroxine (SYNTHROID, LEVOTHROID) 75 MCG tablet Take 37.5 mcg by mouth daily.     . metFORMIN (GLUCOPHAGE) 500 MG tablet Take 500 mg by mouth 2 (two) times daily.    . ondansetron (ZOFRAN) 4 MG tablet Take 4 mg by mouth every 8 (eight) hours as needed for nausea or vomiting.    . ranitidine (ZANTAC) 150 MG capsule Take 150 mg by mouth daily.     Marland Kitchen spironolactone (ALDACTONE) 25 MG tablet Take 25 mg by mouth daily.      No current facility-administered medications for this visit.     Allergies:   Bactrim; Cephalexin; Clindamycin; Contrast media [iodinated diagnostic agents]; Latex; Levofloxacin; Nitrofurantoin monohyd macro; and Penicillins   Social History:  The patient  reports that she quit smoking about 19 years ago. Her smoking use included Cigarettes. She started smoking about 66 years ago. She has a 6.00 pack-year smoking history. She has never used smokeless tobacco. She reports that she does  not drink alcohol or use drugs.   Family History:  The patient's family history includes CVA in her mother; Heart Problems in her mother; Heart disease in her maternal grandfather and maternal grandmother.    ROS:  Please see the history of present illness.   All other systems are reviewed and negative.    PHYSICAL EXAM: Vitals:   07/11/16 1124  BP: 120/79  Pulse: 60  Temp: 97.5 F (36.4 C)   GEN: Well nourished, well developed, in no acute distress  HEENT: very poor dentition Cardiac: RRR; no murmurs, rubs, or gallops,trace edema  Respiratory:  clear to auscultation bilaterally, normal work of breathing GI: soft, nontender,  nondistended, + BS MS: no deformity or atrophy  Skin: warm and dry, device pocket is well healed Neuro:  Strength and sensation are intact Psych: euthymic mood, full affect       Wt Readings from Last 3 Encounters:  11/29/15 186 lb (84.4 kg)  09/04/15 192 lb (87.1 kg)  05/30/15 194 lb (88 kg)    Dr Chuck Hint notes are reviewed  ASSESSMENT AND PLAN:  1.  VF Controlled, without recurrence ERI battery status Risks, benefits, and alternatives to BiV ICD pulse generator replacement were discussed in detail today.  The patient understands that risks include but are not limited to bleeding, infection, pneumothorax, perforation, tamponade, vascular damage, renal failure, MI, stroke, death, inappropriate shocks, damage to his existing leads, and lead dislodgement and wishes to proceed at this time.     ICD Criteria  Current LVEF:40%. Within 12 months prior to implant: No   Heart failure history: Yes, Class III  Cardiomyopathy history: Yes, Non-Ischemic Cardiomyopathy.  Atrial Fibrillation/Atrial Flutter: Yes, Paroxysmal.  Ventricular tachycardia history: Yes, Hemodynamic instability present. VT Type: Sustained Ventricular Tachycardia - Polymorphic.  Cardiac arrest history: no  History of syndromes with risk of sudden death: No.  Previous ICD: Yes, Reason for ICD:  Primary prevention.  Current ICD indication: Secondary  PPM indication: BiV paced  Beta Blocker therapy for 3 or more months: Yes, prescribed.   Ace Inhibitor/ARB therapy for 3 or more months: No, medical reason.   Stopped mid 2017 due to hypotension    Thompson Grayer MD, [facility] 07/11/2016 12:54 PM

## 2016-07-11 NOTE — Discharge Instructions (Signed)

## 2016-07-12 ENCOUNTER — Encounter: Payer: Medicare Other | Admitting: *Deleted

## 2016-07-12 ENCOUNTER — Encounter (HOSPITAL_COMMUNITY): Payer: Self-pay | Admitting: Internal Medicine

## 2016-07-15 ENCOUNTER — Telehealth: Payer: Self-pay | Admitting: Internal Medicine

## 2016-07-15 NOTE — Telephone Encounter (Signed)
Patient called asking when she could the dressing off of her incision. She stats that she currently has a guaze with tape over top of her incision. I told her that she could remove the gauze and dressing but that she should leave the steri strips until her wound check appointment on 7/3. She verbalized understanding. I explained that she should call back if she notices any redness, swelling, drainage, fever or chills. She verbalized understanding and denied any of these symptoms currently.

## 2016-07-15 NOTE — Telephone Encounter (Signed)
°  1. Has your device fired? no 2. Is you device beeping? no  3. Are you experiencing draining or swelling at device site? yes  4. Are you calling to see if we received your device transmission? no 5. Have you passed out? No  Device is red and she said that it hurts

## 2016-07-16 ENCOUNTER — Ambulatory Visit (INDEPENDENT_AMBULATORY_CARE_PROVIDER_SITE_OTHER): Payer: Medicare Other | Admitting: *Deleted

## 2016-07-16 ENCOUNTER — Telehealth: Payer: Self-pay | Admitting: Cardiology

## 2016-07-16 DIAGNOSIS — I5022 Chronic systolic (congestive) heart failure: Secondary | ICD-10-CM

## 2016-07-16 NOTE — Telephone Encounter (Signed)
Patient called and stated that she is having difficulities taking bandage off of device site from Gen change on 07-11-16. After consulting with Device Clinic RN informed pt that she could wait until her 07-23-16 appointment and Device Clinic RN could remove bandage at that time or she could come in today. Pt stated that she wanted to come in today b/c the bandage is irritating her and she doesn't feel well. She has been dizzy, her heart hasn't felt right, and her legs feel weak. Pt agreed to an appt today (07-16-16) at 2:00 PM.

## 2016-07-16 NOTE — Progress Notes (Signed)
Wound check appointment. Steri-strips remain in place. Informed patient that the steri strips could not be removed until her appt next week. Patient verbalized understanding. CRT-D check in office d/t weak legs, dizziness, and "different heart beat". Normal device function. Thresholds, sensing, and impedances consistent with implant measurements. Base rate 80bpm per JA for PVC suppression, no change. Device programmed at appropriate safety margins. Histogram distribution appropriate for patient and level of activity. No mode switches or ventricular arrhythmias noted. Patient educated about vibratory alert and shock plan. ROV on 7/3 as scheduled.

## 2016-07-23 ENCOUNTER — Ambulatory Visit (INDEPENDENT_AMBULATORY_CARE_PROVIDER_SITE_OTHER): Payer: Medicare Other | Admitting: *Deleted

## 2016-07-23 DIAGNOSIS — Z95810 Presence of automatic (implantable) cardiac defibrillator: Secondary | ICD-10-CM

## 2016-07-23 DIAGNOSIS — I5022 Chronic systolic (congestive) heart failure: Secondary | ICD-10-CM | POA: Diagnosis not present

## 2016-07-23 NOTE — Progress Notes (Signed)
Wound check appointment. Steri-strips removed. Wound without redness or edema. Incision edges approximated, wound well healed. Normal device function. Thresholds, sensing, and impedances consistent with implant measurements. Device programmed at chronic output values. Histogram distribution appropriate for patient and level of activity. <1% AT/AF. No ventricular arrhythmias noted. Patient educated about wound care, arm mobility, shock plan. ROV 10/25/2016 w/ JA

## 2016-08-23 ENCOUNTER — Encounter: Payer: Medicare Other | Admitting: Internal Medicine

## 2016-09-10 ENCOUNTER — Ambulatory Visit (INDEPENDENT_AMBULATORY_CARE_PROVIDER_SITE_OTHER): Payer: Medicare Other

## 2016-09-10 ENCOUNTER — Telehealth: Payer: Self-pay | Admitting: Cardiology

## 2016-09-10 DIAGNOSIS — I5022 Chronic systolic (congestive) heart failure: Secondary | ICD-10-CM

## 2016-09-10 DIAGNOSIS — Z95810 Presence of automatic (implantable) cardiac defibrillator: Secondary | ICD-10-CM | POA: Diagnosis not present

## 2016-09-10 NOTE — Telephone Encounter (Signed)
Spoke with pt and reminded pt of remote transmission that is due today. Pt verbalized understanding.   

## 2016-09-10 NOTE — Progress Notes (Signed)
EPIC Encounter for ICM Monitoring  Patient Name: Melissa Paul is a 71 y.o. female Date: 09/10/2016 Primary Care Physican: Deloria Lair., MD Primary Cardiologist:McDowell Electrophysiologist: Allred Dry Weight:184 lbs Bi-V Pacing: 98%      Heart Failure questions reviewed, pt asymptomatic.  She complains of soreness at the pacemaker site.  Denied any redness, swelling or fever.  Advised to call the office for appointment with device clinic nurse if needed to look at the site.   Thoracic impedance normal.  Prescribed dosage: Furosemide 80 mg in morning and 40 mg in evening  Labs: 07/11/2016 Creatinine 1.02, BUN 9,   Potassium 4.3, Sodium 139, EGFR 54->60 01/04/2016 Creatinine 0.79, BUN 13, Potassium 3.8, Sodium 134, EGFR>60 12/04/2015 Creatinine 0.67, BUN 12, Potassium 3.7, Sodium 138, EGFR >60 06/12/2015 Creatinine 0.90, BUN 11, Potassium 4.1, Sodium 140, EGFR >60  10/31/2016Creatinine 1.05, BUN 13, Potassium 4.2, Sodium 136, EGFR 52->60  Recommendations: No changes.   Encouraged to call for fluid symptoms.  Follow-up plan: ICM clinic phone appointment on 10/11/2016.  Office appointment scheduled 10/17/2016 with Dr. Domenic Polite and 10/25/2016 with Dr [name].  Copy of ICM check sent to device physician.   3 month ICM trend: 09/10/2016   1 Year ICM trend:      [name], RN 09/10/2016 1:41 PM

## 2016-09-24 ENCOUNTER — Other Ambulatory Visit: Payer: Self-pay | Admitting: Cardiology

## 2016-10-11 ENCOUNTER — Ambulatory Visit (INDEPENDENT_AMBULATORY_CARE_PROVIDER_SITE_OTHER): Payer: Medicare Other

## 2016-10-11 DIAGNOSIS — I5022 Chronic systolic (congestive) heart failure: Secondary | ICD-10-CM | POA: Diagnosis not present

## 2016-10-11 DIAGNOSIS — Z95810 Presence of automatic (implantable) cardiac defibrillator: Secondary | ICD-10-CM | POA: Diagnosis not present

## 2016-10-11 NOTE — Progress Notes (Signed)
EPIC Encounter for ICM Monitoring  Patient Name: Melissa Paul is a 71 y.o. female Date: 10/11/2016 Primary Care Physican: Deloria Lair., MD Primary Cardiologist:McDowell Electrophysiologist: Allred Dry Weight:190 lbs today (base weight 181-182 lbs) Bi-V Pacing: 98%           Heart Failure questions reviewed, pt symptomatic with 8 lbs weight gain in last 2 days, abdominal bloating, and swelling in lower legs from knees to feet.   Had UTI in the last couple of weeks and waiting on result of specimen to check if it has completely resolved.      Thoracic impedance abnormal suggesting fluid accumulation from 09/29/2016 through 10/08/2016 and starting to trend below baseline 10/10/2016.  Prescribed dosage: Furosemide 80 mg in morning and 40 mg in evening (confirmed today)  Labs: 07/11/2016 Creatinine 1.02, BUN 9,   Potassium 4.3, Sodium 139, EGFR 54->60 01/04/2016 Creatinine 0.79, BUN 13, Potassium 3.8, Sodium 134, EGFR>60 12/04/2015 Creatinine 0.67, BUN 12, Potassium 3.7, Sodium 138, EGFR >60 06/12/2015 Creatinine 0.90, BUN 11, Potassium 4.1, Sodium 140, EGFR >60  10/31/2016Creatinine 1.05, BUN 13, Potassium 4.2, Sodium 136, EGFR 52->60  Recommendations: Advised to increase Furosemide to 120 mg every AM and 80 mg in PM x 3 days and then return to prior dosage. Advised if symptoms worsen over the weekend to call office for on call physician or use ER if needed.  Advised if no improvement to call back on Monday morning.   Follow-up plan: ICM clinic phone appointment on 10/15/2016 (manual send) 2 days prior to office appointment 10/17/2016 with Dr. Domenic Polite.   Office appointment scheduled on 10/25/2016 with Dr [name].  Copy of ICM check sent to Dr. Domenic Polite and Dr. [name] for review and further recommendations if needed.   3 month ICM trend: 10/11/2016   1 Year ICM trend:      [name], RN 10/11/2016 8:45 AM

## 2016-10-15 ENCOUNTER — Telehealth: Payer: Self-pay | Admitting: Cardiology

## 2016-10-15 ENCOUNTER — Ambulatory Visit (INDEPENDENT_AMBULATORY_CARE_PROVIDER_SITE_OTHER): Payer: Medicare Other

## 2016-10-15 DIAGNOSIS — I5022 Chronic systolic (congestive) heart failure: Secondary | ICD-10-CM

## 2016-10-15 DIAGNOSIS — Z95810 Presence of automatic (implantable) cardiac defibrillator: Secondary | ICD-10-CM

## 2016-10-15 NOTE — Telephone Encounter (Signed)
Spoke with pt and reminded pt of remote transmission that is due today. Pt verbalized understanding.   

## 2016-10-15 NOTE — Progress Notes (Signed)
EPIC Encounter for ICM Monitoring  Patient Name: Melissa Paul is a 71 y.o. female Date: 10/15/2016 Primary Care Physican: Deloria Lair., MD Primary Cardiologist:McDowell Electrophysiologist: Allred Dry Weight:188lbs today (base weight 181-182 lbs) Bi-V Pacing: 98%      Heart Failure questions reviewed, pt symptomatic with abdominal bloating, swelling of legs and feet.  2 lb weight loss after 3 days of increased Furosemide but still has a 6 lb weight gain from baseline.   Thoracic impedance returned to baseline normal after 3 days of increased Furosemide but she continues to be symptomatic.  She does not think the increased Furosemide increased urination.    Prescribed dosage: Furosemide 80 mg in morning and 40 mg in evening.  Labs: 07/11/2016 Creatinine 1.02, BUN 9, Potassium 4.3, Sodium 139, EGFR 54->60 01/04/2016 Creatinine 0.79, BUN 13, Potassium 3.8, Sodium 134, EGFR>60 12/04/2015 Creatinine 0.67, BUN 12, Potassium 3.7, Sodium 138, EGFR >60 06/12/2015 Creatinine 0.90, BUN 11, Potassium 4.1, Sodium 140, EGFR >60  10/31/2016Creatinine 1.05, BUN 13, Potassium 4.2, Sodium 136, EGFR 52->60  Recommendations: Advised Dr Domenic Polite will make any recommendations if needed at the office appointment on 10/17/2016.  Follow-up plan: ICM clinic phone appointment on 11/25/2016.  Office appointment 10/17/2016 with Dr. Domenic Polite.   Office appointment scheduled on 10/25/2016 with Dr [name].  Copy of ICM check sent to Dr. Domenic Polite and Dr. [name].   3 month ICM trend: 10/15/2016   1 Year ICM trend:      [name], RN 10/15/2016 12:06 PM

## 2016-10-17 ENCOUNTER — Ambulatory Visit (INDEPENDENT_AMBULATORY_CARE_PROVIDER_SITE_OTHER): Payer: Medicare Other | Admitting: Cardiology

## 2016-10-17 ENCOUNTER — Encounter: Payer: Self-pay | Admitting: Cardiology

## 2016-10-17 VITALS — BP 91/65 | HR 85 | Ht 64.0 in | Wt 188.0 lb

## 2016-10-17 DIAGNOSIS — Z95810 Presence of automatic (implantable) cardiac defibrillator: Secondary | ICD-10-CM

## 2016-10-17 DIAGNOSIS — I428 Other cardiomyopathies: Secondary | ICD-10-CM

## 2016-10-17 DIAGNOSIS — I340 Nonrheumatic mitral (valve) insufficiency: Secondary | ICD-10-CM

## 2016-10-17 DIAGNOSIS — I5042 Chronic combined systolic (congestive) and diastolic (congestive) heart failure: Secondary | ICD-10-CM

## 2016-10-17 MED ORDER — FUROSEMIDE 80 MG PO TABS: 80.0000 mg | ORAL_TABLET | Freq: Two times a day (BID) | ORAL | 1 refills | Status: DC

## 2016-10-17 NOTE — Progress Notes (Signed)
Cardiology Office Note  Date: 10/17/2016   ID: Melissa Paul, DOB [birth year], MRN [id number]  PCP: Melissa Paul., MD  Primary Cardiologist: Melissa Lesches, MD   Chief Complaint  Patient presents with  . Cardiomyopathy    History of Present Illness: Melissa Paul is a 71 y.o. female last seen in May. She presents for a routine follow-up visit. Since last encounter she has experienced weight gain with an increased feeling of shortness of breath and tightness when she takes in a deep breath. She reports compliance with her medications including standing diuretics. Her weight is up 9 pounds compared to June.  She continues to follow with Dr. Rayann Paul in the device clinic. She has a [employer]. Jude biventricular ICD in place, underwent a recent generator change in June. Recent thoracic impedance check was normal after 3 days of increased Lasix and reported weight gain. She does not report any palpitations or device shocks.  Last echocardiogram was in 2016 as outlined below. We discussed obtaining an updated study.  Current cardiac regimen includes Coreg, Lasix, and Aldactone. She tends to have a low blood pressure and is not on ARB or Entresto.  Past Medical History:  Diagnosis Date  . Chronic systolic heart failure ([place])   . Glucose intolerance (pre-diabetes)   . Hyperlipemia   . Hypothyroidism   . LBBB (left bundle branch block)   . Mitral regurgitation    Mild to Moderate  . Nonischemic cardiomyopathy ([place])    LVEF 35-40% by echo 8/13  . Paroxysmal atrial fibrillation (H[facility])    Brief episodes by device interrogation  . UTI (urinary tract infection)    Recurrent  . Ventricular tachycardia ([place])    ICD therapy for VT    Past Surgical History:  Procedure Laterality Date  . APPENDECTOMY    . BIV ICD GENERATOR CHANGEOUT N/A 07/11/2016   Procedure: BiV ICD Generator Changeout;  Surgeon: Thompson Grayer, MD;  Location: [place] CV LAB;  Service: Cardiovascular;  Laterality:  N/A;  . CARDIAC DEFIBRILLATOR PLACEMENT  09/2009   St. Jude BiV ICD by Dr. Sandria Paul class III  . CHOLECYSTECTOMY    . KNEE ARTHROSCOPY     Left  . LEFT AND RIGHT HEART CATHETERIZATION WITH CORONARY ANGIOGRAM N/A 04/12/2014   Procedure: LEFT AND RIGHT HEART CATHETERIZATION WITH CORONARY ANGIOGRAM;  Surgeon: Leonie Man, MD;  Location: [facility] CATH LAB;  Service: Cardiovascular;  Laterality: N/A;  . TOTAL ABDOMINAL HYSTERECTOMY     (R) ovary removed    Current Outpatient Prescriptions  Medication Sig Dispense Refill  . acetaminophen (TYLENOL) 500 MG tablet Take 1,000 mg by mouth every 6 (six) hours as needed (for pain.).    Marland Kitchen aspirin EC 81 MG tablet Take 81 mg by mouth daily.    . carvedilol (COREG) 12.5 MG tablet TAKE ONE TABLET BY MOUTH TWICE DAILY 180 tablet 3  . cetirizine (ZYRTEC) 10 MG tablet Take 10 mg by mouth daily as needed for allergies or rhinitis.     Marland Kitchen diazepam (VALIUM) 5 MG tablet Take 5 mg by mouth daily.     Marland Kitchen levothyroxine (SYNTHROID, LEVOTHROID) 75 MCG tablet Take 37.5 mcg by mouth daily.     . metFORMIN (GLUCOPHAGE) 500 MG tablet Take 500 mg by mouth 2 (two) times daily.    . ranitidine (ZANTAC) 150 MG capsule Take 150 mg by mouth daily as needed (for heartburn/indigestion).     Marland Kitchen spironolactone (ALDACTONE) 25 MG tablet Take 25 mg by mouth  daily.     . furosemide (LASIX) 80 MG tablet Take 1 tablet (80 mg total) by mouth 2 (two) times daily. 180 tablet 1   No current facility-administered medications for this visit.    Allergies:  Bactrim; Cephalexin; Chlorhexidine gluconate; Clindamycin; Contrast media [iodinated diagnostic agents]; Latex; Levofloxacin; Nitrofurantoin monohyd macro; and Penicillins   Social History: The patient  reports that she quit smoking about 20 years ago. Her smoking use included Cigarettes. She started smoking about 67 years ago. She has a 6.00 pack-year smoking history. She has never used smokeless tobacco. She reports that she does not drink  alcohol or use drugs.   ROS:  Please see the history of present illness. Otherwise, complete review of systems is positive for generalized fatigue, no syncope..  All other systems are reviewed and negative.   Physical Exam: VS:  BP 91/65   Pulse 85   Ht 5\' 4"  (1.626 m)   Wt 188 lb (85.3 kg)   SpO2 96% Comment: on room air  BMI 32.27 kg/m , BMI Body mass index is 32.27 kg/m.  Wt Readings from Last 3 Encounters:  10/17/16 188 lb (85.3 kg)  07/11/16 179 lb (81.2 kg)  06/13/16 188 lb (85.3 kg)    General: Overweight woman, appears comfortable at rest. HEENT: Conjunctiva and lids normal, oropharynx clear. Neck: Supple, no elevated JVP or carotid bruits, no thyromegaly. Lungs: Clear to auscultation, nonlabored breathing at rest. Cardiac: RRR, no S3 or significant systolic murmur, no pericardial rub. Abdomen: Soft, nontender, bowel sounds present, no guarding or rebound. Extremities: Mild lower leg edema, distal pulses 2+. Skin: Warm and dry. Musculoskeletal: No kyphosis. Neuropsychiatric: Alert and oriented x3, affect grossly appropriate.  ECG: I personally reviewed the tracing from 07/11/2016 which showed a dual-chamber paced rhythm.  Recent Labwork: 07/11/2016: BUN 9; Creatinine, Ser 1.02; Hemoglobin 14.0; Platelets 247; Potassium 4.3; Sodium 139   Other Studies Reviewed Today:  Echocardiogram 03/24/2014: Study Conclusions  - Left ventricle: Systolic function was moderately reduced. Estimated LVEF 40%. Global hypokinesis was seen. The cavity size was normal. Wall thickness was increased in a pattern of mild LVH. Features are consistent with a pseudonormal left ventricular filling pattern, with concomitant abnormal relaxation and increased filling pressure (grade 2 diastolic dysfunction). Doppler parameters are consistent with high ventricular filling pressure. - Left atrium: The atrium was mildly dilated. Volume/bsa, ES, (1-plane Simpson&'s, A2C): 30.3  ml/m^2. - Right ventricle: Pacer wire or catheter noted in right ventricle. Systolic function was mildly reduced. - Right atrium: The atrium was mildly dilated. Pacer wire or catheter noted in right atrium. - Tricuspid valve: There was mild regurgitation.  Assessment and Plan:  1. Chronic combined heart failure. Weight is up about 9 pounds compared to June. Plan will be to increase Lasix to 80 mg twice daily, follow-up BMET in 2 weeks.  2. Nonischemic cardiomyopathy. Normal coronary arteries were documented at cardiac catheterization in 2016. Last assessment of LVEF was 40%. Follow-up echocardiogram will be obtained. She is on Coreg and Aldactone. Not on ARB or Entresto with low blood pressures.  3. Sig Jude biventricular ICD in place, interval generator change noted per Dr. Rayann Paul. Keep follow-up in the device clinic.  4. History of mild to moderate mitral regurgitation. This will be reassessed by echocardiography.  Current medicines were reviewed with the patient today.   Orders Placed This Encounter  Procedures  . Basic Metabolic Panel (BMET)  . ECHOCARDIOGRAM COMPLETE    Disposition: Follow-up in one month.  Signed, Mikeal Hawthorne  Delman Kitten, MD, [facility] 10/17/2016 1:22 PM    [facility] [place], [place], [place] [id number] Phone: [phone]; Fax: [phone]

## 2016-10-17 NOTE — Patient Instructions (Signed)
Your physician recommends that you schedule a follow-up appointment in: [place] has recommended you make the following change in your medication:   INCREASE LASIX 25 MG TWICE DAILY  Your physician recommends that you return for lab work in: [place] has requested that you have an echocardiogram. Echocardiography is a painless test that uses sound waves to create images of your heart. It provides your doctor with information about the size and shape of your heart and how well your heart's chambers and valves are working. This procedure takes approximately one hour. There are no restrictions for this procedure.   Thank you for choosing [facility]!!

## 2016-10-23 ENCOUNTER — Ambulatory Visit (INDEPENDENT_AMBULATORY_CARE_PROVIDER_SITE_OTHER): Payer: Medicare Other

## 2016-10-23 ENCOUNTER — Other Ambulatory Visit: Payer: Self-pay

## 2016-10-23 DIAGNOSIS — I428 Other cardiomyopathies: Secondary | ICD-10-CM

## 2016-10-24 ENCOUNTER — Telehealth: Payer: Self-pay

## 2016-10-24 NOTE — Telephone Encounter (Signed)
-----   Message from Merlene Laughter, LPN sent at 41/02/8206  4:40 PM EDT -----   ----- Message ----- From: Satira Sark, MD Sent: 10/23/2016   4:39 PM To: Merlene Laughter, LPN  Results reviewed. Renal function and potassium levels are normal. A copy of this test should be forwarded to Deloria Lair., MD.

## 2016-10-24 NOTE — Telephone Encounter (Signed)
Patient notified. Routed to PCP 

## 2016-10-24 NOTE — Telephone Encounter (Signed)
-----   Message from Merlene Laughter, LPN sent at 47/08/4126  1:06 PM EDT -----   ----- Message ----- From: Satira Sark, MD Sent: 10/23/2016   1:03 PM To: Merlene Laughter, LPN  Results reviewed. Overall stable LVEF in the range of 40-45%. Mitral regurgitation is only mild at this point. Continue with current medication plan. A copy of this test should be forwarded to Deloria Lair., MD.

## 2016-10-25 ENCOUNTER — Ambulatory Visit (INDEPENDENT_AMBULATORY_CARE_PROVIDER_SITE_OTHER): Payer: Medicare Other | Admitting: Internal Medicine

## 2016-10-25 ENCOUNTER — Encounter: Payer: Self-pay | Admitting: Internal Medicine

## 2016-10-25 VITALS — BP 98/64 | HR 70 | Ht 66.0 in | Wt 186.0 lb

## 2016-10-25 DIAGNOSIS — I472 Ventricular tachycardia, unspecified: Secondary | ICD-10-CM

## 2016-10-25 DIAGNOSIS — I5022 Chronic systolic (congestive) heart failure: Secondary | ICD-10-CM | POA: Diagnosis not present

## 2016-10-25 DIAGNOSIS — I428 Other cardiomyopathies: Secondary | ICD-10-CM | POA: Diagnosis not present

## 2016-10-25 DIAGNOSIS — I480 Paroxysmal atrial fibrillation: Secondary | ICD-10-CM | POA: Diagnosis not present

## 2016-10-25 NOTE — Patient Instructions (Signed)
Medication Instructions:  Continue all current medications.  Labwork: none  Testing/Procedures: none  Follow-Up: Your physician wants you to follow up in:  1 year.  You will receive a reminder letter in the mail one-two months in advance.  If you don't receive a letter, please call our office to schedule the follow up appointment   Any Other Special Instructions Will Be Listed Below (If Applicable). Remote monitoring is used to monitor your Pacemaker of ICD from home. This monitoring reduces the number of office visits required to check your device to one time per year. It allows [place] to keep an eye on the functioning of your device to ensure it is working properly. You are scheduled for a device check from home on 11-25-16. You may send your transmission at any time that day. If you have a wireless device, the transmission will be sent automatically. After your physician reviews your transmission, you will receive a postcard with your next transmission date.  If you need a refill on your cardiac medications before your next appointment, please call your pharmacy.

## 2016-10-25 NOTE — Progress Notes (Signed)
PCP: Deloria Lair., MD Primary Cardiologist:  Dr Domenic Polite Primary EP: Dr Rogue Jury Melissa Paul is a 71 y.o. female who presents today for routine electrophysiology followup.  Since her recent generator change, the patient reports doing very well.  Today, she denies symptoms of palpitations, chest pain, shortness of breath,  lower extremity edema, dizziness, presyncope, syncope, or ICD shocks.  The patient is otherwise without complaint today.   Past Medical History:  Diagnosis Date  . Chronic systolic heart failure ([place])   . Glucose intolerance (pre-diabetes)   . Hyperlipemia   . Hypothyroidism   . LBBB (left bundle branch block)   . Mitral regurgitation    Mild to Moderate  . Nonischemic cardiomyopathy ([place])    LVEF 35-40% by echo 8/13  . Paroxysmal atrial fibrillation (H[facility])    Brief episodes by device interrogation  . UTI (urinary tract infection)    Recurrent  . Ventricular tachycardia ([place])    ICD therapy for VT   Past Surgical History:  Procedure Laterality Date  . APPENDECTOMY    . BIV ICD GENERATOR CHANGEOUT N/A 07/11/2016   Procedure: BiV ICD Generator Changeout;  Surgeon: Thompson Grayer, MD;  Location: [facility] LAB;  Service: Cardiovascular;  Laterality: N/A;  . CARDIAC DEFIBRILLATOR PLACEMENT  09/2009   St. Jude BiV ICD by Dr. Sandria Manly class III  . CHOLECYSTECTOMY    . KNEE ARTHROSCOPY     Left  . LEFT AND RIGHT HEART CATHETERIZATION WITH CORONARY ANGIOGRAM N/A 04/12/2014   Procedure: LEFT AND RIGHT HEART CATHETERIZATION WITH CORONARY ANGIOGRAM;  Surgeon: Leonie Man, MD;  Location: [facility] CATH LAB;  Service: Cardiovascular;  Laterality: N/A;  . TOTAL ABDOMINAL HYSTERECTOMY     (R) ovary removed    ROS- all systems are reviewed and negative except as per HPI above  Current Outpatient Prescriptions  Medication Sig Dispense Refill  . acetaminophen (TYLENOL) 500 MG tablet Take 1,000 mg by mouth every 6 (six) hours as needed (for pain.).    Marland Kitchen aspirin  EC 81 MG tablet Take 81 mg by mouth daily.    . carvedilol (COREG) 12.5 MG tablet TAKE ONE TABLET BY MOUTH TWICE DAILY 180 tablet 3  . cetirizine (ZYRTEC) 10 MG tablet Take 10 mg by mouth daily as needed for allergies or rhinitis.     Marland Kitchen diazepam (VALIUM) 5 MG tablet Take 5 mg by mouth daily.     . furosemide (LASIX) 80 MG tablet Take 1 tablet (80 mg total) by mouth 2 (two) times daily. 180 tablet 1  . levothyroxine (SYNTHROID, LEVOTHROID) 75 MCG tablet Take 37.5 mcg by mouth daily.     . metFORMIN (GLUCOPHAGE) 500 MG tablet Take 500 mg by mouth 2 (two) times daily.    . ranitidine (ZANTAC) 150 MG capsule Take 150 mg by mouth daily as needed (for heartburn/indigestion).     Marland Kitchen spironolactone (ALDACTONE) 25 MG tablet Take 25 mg by mouth daily.      No current facility-administered medications for this visit.     Physical Exam: Vitals:   10/25/16 0933  BP: 98/64  Pulse: 70  SpO2: 98%  Weight: 186 lb (84.4 kg)  Height: 5\' 6"  (1.676 m)    GEN- The patient is well appearing, alert and oriented x 3 today.   Head- normocephalic, atraumatic Eyes-  Sclera clear, conjunctiva pink Ears- hearing intact Oropharynx- clear Lungs- Clear to ausculation bilaterally, normal work of breathing Chest- ICD pocket is well healed Heart- Regular rate  and rhythm, no murmurs, rubs or gallops, PMI not laterally displaced GI- soft, NT, ND, + BS Extremities- no clubbing, cyanosis, or edema  ICD interrogation- reviewed in detail today,  See PACEART report  ekg tracing ordered today is personally reviewed and shows AV paced rhythm with nonsustained atach Recent echo is reviewed with the patient today,  EF is 40-45%  Assessment and Plan:  1.  Chronic systolic dysfunction euvolemic today Stable on an appropriate medical regimen Normal ICD function See Pace Art report No changes today Lower heart rate set at 80 bpm to overdrive suppress PVCs.  She seems to be tolerating.  May need to reduce heart rates in  the future  2. VF Well controlled Normal ICD function See Pace Art report No changes today  3. afib Asymptomatic and burden < 1 %, episodes are very short She declines anticoagulation (chadsvasc is 3) Avoid AAD at this time unless afib burden increases or symptoms   4. Poor dentition Very poor dentition Risks of device infection increase were discussed today.  I have strongly encouraged her to see a dentist to arrange for appropriate management previously.  Unfortunately, she has failed to do so.  Merlin Return to see me in a year Follow-up with Dr Domenic Polite as scheduled  Thompson Grayer MD, [facility] 10/25/2016 10:24 AM

## 2016-10-28 LAB — CUP PACEART INCLINIC DEVICE CHECK
Battery Remaining Longevity: 69 mo
Brady Statistic RA Percent Paced: 99 %
Brady Statistic RV Percent Paced: 98 %
Date Time Interrogation Session: 20181005134053
HighPow Impedance: 78.75 Ohm
Implantable Lead Implant Date: 20110912
Implantable Lead Implant Date: 20110912
Implantable Lead Implant Date: 20120710
Implantable Lead Location: 753858
Implantable Lead Location: 753859
Implantable Lead Location: 753860
Implantable Lead Model: 7122
Implantable Pulse Generator Implant Date: 20180621
Lead Channel Impedance Value: 387.5 Ohm
Lead Channel Impedance Value: 575 Ohm
Lead Channel Impedance Value: 625 Ohm
Lead Channel Pacing Threshold Amplitude: 0.75 V
Lead Channel Pacing Threshold Amplitude: 0.75 V
Lead Channel Pacing Threshold Amplitude: 0.75 V
Lead Channel Pacing Threshold Amplitude: 0.75 V
Lead Channel Pacing Threshold Amplitude: 0.75 V
Lead Channel Pacing Threshold Amplitude: 0.75 V
Lead Channel Pacing Threshold Pulse Width: 0.5 ms
Lead Channel Pacing Threshold Pulse Width: 0.5 ms
Lead Channel Pacing Threshold Pulse Width: 0.5 ms
Lead Channel Pacing Threshold Pulse Width: 0.5 ms
Lead Channel Pacing Threshold Pulse Width: 0.5 ms
Lead Channel Pacing Threshold Pulse Width: 0.5 ms
Lead Channel Sensing Intrinsic Amplitude: 12 mV
Lead Channel Sensing Intrinsic Amplitude: 4.3 mV
Lead Channel Setting Pacing Amplitude: 2 V
Lead Channel Setting Pacing Amplitude: 2 V
Lead Channel Setting Pacing Amplitude: 2 V
Lead Channel Setting Pacing Pulse Width: 0.5 ms
Lead Channel Setting Pacing Pulse Width: 0.5 ms
Lead Channel Setting Sensing Sensitivity: 0.5 mV
Pulse Gen Serial Number: 9761374

## 2016-11-07 NOTE — Progress Notes (Signed)
Cardiology Office Note  Date: 11/08/2016   ID: Melissa Paul, DOB May 13, 1945, MRN [id number]  PCP: Deloria Lair., MD  Primary Cardiologist: Rozann Lesches, MD   Chief Complaint  Patient presents with  . Cardiomyopathy    History of Present Illness:  Melissa Paul is a 71 y.o. female seen recently in September. She is here today for a follow-up visit. States that the swelling is better, she has been less short of breath.  At the last visit we increased Lasix to 80 mg BID. Follow-up echocardiogram showed LVEF 40-45% with mild mitral regurgitation. I reviewer her labwork as outlined below. She also tells her that she has been putting her legs up more when she is sitting. No orthopnea or PND.  She follows in the device clinic with Dr. [name], St. Jude biventricular ICD in place. She had a recent visit this month. Recent device interrogation noted. Biventricular pacing is 98% of the time. She has had some brief mode switches. No sustained arrhythmias.  Past Medical History:  Diagnosis Date  . Chronic systolic heart failure ([place])   . Glucose intolerance (pre-diabetes)   . Hyperlipemia   . Hypothyroidism   . LBBB (left bundle branch block)   . Mitral regurgitation    Mild to Moderate  . Nonischemic cardiomyopathy ([place])    LVEF 35-40% by echo 8/13  . Paroxysmal atrial fibrillation (H[facility])    Brief episodes by device interrogation  . UTI (urinary tract infection)    Recurrent  . Ventricular tachycardia ([place])    ICD therapy for VT    Past Surgical History:  Procedure Laterality Date  . APPENDECTOMY    . BIV ICD GENERATOR CHANGEOUT N/A 07/11/2016   Procedure: BiV ICD Generator Changeout;  Surgeon: Thompson Grayer, MD;  Location: [facility] CV LAB;  Service: Cardiovascular;  Laterality: N/A;  . CARDIAC DEFIBRILLATOR PLACEMENT  09/2009   St. Jude BiV ICD by Dr. Sandria Manly class III  . CHOLECYSTECTOMY    . KNEE ARTHROSCOPY     Left  . LEFT AND RIGHT HEART CATHETERIZATION  WITH CORONARY ANGIOGRAM N/A 04/12/2014   Procedure: LEFT AND RIGHT HEART CATHETERIZATION WITH CORONARY ANGIOGRAM;  Surgeon: Leonie Man, MD;  Location: [facility] CATH LAB;  Service: Cardiovascular;  Laterality: N/A;  . TOTAL ABDOMINAL HYSTERECTOMY     (R) ovary removed    Current Outpatient Prescriptions  Medication Sig Dispense Refill  . acetaminophen (TYLENOL) 500 MG tablet Take 1,000 mg by mouth every 6 (six) hours as needed (for pain.).    Marland Kitchen aspirin EC 81 MG tablet Take 81 mg by mouth daily.    . carvedilol (COREG) 12.5 MG tablet TAKE ONE TABLET BY MOUTH TWICE DAILY 180 tablet 3  . cetirizine (ZYRTEC) 10 MG tablet Take 10 mg by mouth daily as needed for allergies or rhinitis.     Marland Kitchen diazepam (VALIUM) 5 MG tablet Take 5 mg by mouth daily.     . furosemide (LASIX) 80 MG tablet Take 1 tablet (80 mg total) by mouth 2 (two) times daily. 180 tablet 1  . levothyroxine (SYNTHROID, LEVOTHROID) 75 MCG tablet Take 37.5 mcg by mouth daily.     . metFORMIN (GLUCOPHAGE) 500 MG tablet Take 500 mg by mouth 2 (two) times daily.    . ranitidine (ZANTAC) 150 MG capsule Take 150 mg by mouth daily as needed (for heartburn/indigestion).     Marland Kitchen spironolactone (ALDACTONE) 25 MG tablet Take 25 mg by mouth daily.  No current facility-administered medications for this visit.    Allergies:  Bactrim; Cephalexin; Chlorhexidine gluconate; Clindamycin; Contrast media [iodinated diagnostic agents]; Latex; Levofloxacin; Nitrofurantoin monohyd macro; and Penicillins   Social History: The patient  reports that she quit smoking about 20 years ago. Her smoking use included Cigarettes. She started smoking about 67 years ago. She has a 6.00 pack-year smoking history. She has never used smokeless tobacco. She reports that she does not drink alcohol or use drugs.   ROS:  Please see the history of present illness. Otherwise, complete review of systems is positive for generalized fatigue.  All other systems are reviewed and  negative.   Physical Exam: VS:  BP (!) 94/58   Pulse 80   Ht 5\' 2"  (1.575 m)   Wt 188 lb (85.3 kg)   SpO2 97%   BMI 34.39 kg/m , BMI Body mass index is 34.39 kg/m.  Wt Readings from Last 3 Encounters:  11/08/16 188 lb (85.3 kg)  10/25/16 186 lb (84.4 kg)  10/17/16 188 lb (85.3 kg)    General: Overweight woman, appears comfortable at rest. HEENT: Conjunctiva and lids normal, oropharynx clear. Neck: Supple, no elevated JVP or carotid bruits, no thyromegaly. Lungs: Clear to auscultation, nonlabored breathing at rest. Cardiac: Regular rate and rhythm, no S3 or significant systolic murmur, no pericardial rub. Abdomen: Soft, nontender, bowel sounds present, no guarding or rebound. Extremities: Mild ankle edema, distal pulses 2+. Skin: Warm and dry. Musculoskeletal: No kyphosis. Neuropsychiatric: Alert and oriented x3, affect grossly appropriate.  ECG: I personally reviewed the tracing from 10/25/2016 which showed AV pacing with brief atrial tachycardia.  Recent Labwork: 07/11/2016: BUN 9; Creatinine, Ser 1.02; Hemoglobin 14.0; Platelets 247; Potassium 4.3; Sodium 139  October 2018: BUN 12, creatinine 0.89, potassium 4.0  Other Studies Reviewed Today:  Echocardiogram 10/23/2016: Study Conclusions  - Left ventricle: The cavity size was mildly dilated. Wall   thickness was increased in a pattern of mild LVH. Systolic   function was mildly to moderately reduced. The estimated ejection   fraction was in the range of 40% to 45%. Diastolic function is   abnormal, indeterminant grade. Diffuse hypokinesis. - Aortic valve: Mildly calcified annulus. Trileaflet; normal   thickness leaflets. Valve area (VTI): 2.08 cm^2. Valve area   (Vmax): 1.94 cm^2. Valve area (Vmean): 1.91 cm^2. - Mitral valve: Mildly calcified annulus. Normal thickness leaflets   . There was mild regurgitation. - Right ventricle: Systolic function was mildly reduced. - Technically adequate study.  Assessment and  Plan:  1. Chronic combined heart failure. Plan to continue current dose of diuretic. Weight by home scale down to 182 pounds which is closer to baseline.  2. Nonischemic myopathy with recent follow-up echo [facility] showing stable LVEF in the range of 40-45%. Continue Coreg and Aldactone. Not on ARB or Entresto due to low blood pressures.  3. St. Jude biventricular ICD in place, keep follow-up with Dr. [name].  Current medicines were reviewed with the patient today.  Disposition: Follow-up in 4 months.  Signed, Satira Sark, MD, [facility] 11/08/2016 1:55 PM    [place] [facility] [address], [place], [place] [address] Phone: [phone]; Fax: [phone]

## 2016-11-08 ENCOUNTER — Ambulatory Visit (INDEPENDENT_AMBULATORY_CARE_PROVIDER_SITE_OTHER): Payer: Medicare Other | Admitting: Cardiology

## 2016-11-08 ENCOUNTER — Encounter: Payer: Self-pay | Admitting: Cardiology

## 2016-11-08 VITALS — BP 94/58 | HR 80 | Ht 62.0 in | Wt 188.0 lb

## 2016-11-08 DIAGNOSIS — Z95810 Presence of automatic (implantable) cardiac defibrillator: Secondary | ICD-10-CM

## 2016-11-08 DIAGNOSIS — I5022 Chronic systolic (congestive) heart failure: Secondary | ICD-10-CM

## 2016-11-08 DIAGNOSIS — I428 Other cardiomyopathies: Secondary | ICD-10-CM | POA: Diagnosis not present

## 2016-11-08 NOTE — Patient Instructions (Signed)
Your physician recommends that you schedule a follow-up appointment in:  4 months in the [place] office with [place]   Your physician recommends that you continue on your current medications as directed. Please refer to the Current Medication list given to you today.    If you need a refill on your cardiac medications before your next appointment, please call your pharmacy.     No testing ordered today.     Thank you for choosing [facility] !

## 2016-11-20 ENCOUNTER — Other Ambulatory Visit: Payer: Self-pay | Admitting: Cardiology

## 2016-11-25 ENCOUNTER — Ambulatory Visit (INDEPENDENT_AMBULATORY_CARE_PROVIDER_SITE_OTHER): Payer: Medicare Other

## 2016-11-25 DIAGNOSIS — I5022 Chronic systolic (congestive) heart failure: Secondary | ICD-10-CM

## 2016-11-25 DIAGNOSIS — Z95810 Presence of automatic (implantable) cardiac defibrillator: Secondary | ICD-10-CM | POA: Diagnosis not present

## 2016-11-25 NOTE — Progress Notes (Signed)
EPIC Encounter for ICM Monitoring  Patient Name: Melissa Paul is a 71 y.o. female Date: 11/25/2016 Primary Care Physican: Deloria Lair., MD Primary Cardiologist:[place] Electrophysiologist: Allred Dry Weight:184lbs (base weight 181-182 lbs) Bi-V Pacing: 97%          Heart Failure questions reviewed, pt asymptomatic.   Thoracic impedance normal.  Prescribed dosage: Furosemide 80 mg twice a day.  Labs: 10/23/2016 Creatinine 0.88, BUN 12, Potassium 4.0, Sodium 138, EGFR > 60 07/11/2016 Creatinine 1.02, BUN 9, Potassium 4.3, Sodium 139, EGFR 54->60 01/04/2016 Creatinine 0.79, BUN 13, Potassium 3.8, Sodium 134, EGFR>60 12/04/2015 Creatinine 0.67, BUN 12, Potassium 3.7, Sodium 138, EGFR >60 06/12/2015 Creatinine 0.90, BUN 11, Potassium 4.1, Sodium 140, EGFR >60  10/31/2016Creatinine 1.05, BUN 13, Potassium 4.2, Sodium 136, EGFR 52->60  Recommendations: No changes.  Encouraged to call for fluid symptoms.  Follow-up plan: ICM clinic phone appointment on 12/26/2016.    Copy of ICM check sent to Dr. [name].   3 month ICM trend: 11/25/2016   1 Year ICM trend:      [name], RN 11/25/2016 11:11 AM

## 2016-12-10 ENCOUNTER — Ambulatory Visit (INDEPENDENT_AMBULATORY_CARE_PROVIDER_SITE_OTHER): Payer: Medicare Other | Admitting: Urology

## 2016-12-10 ENCOUNTER — Other Ambulatory Visit (HOSPITAL_COMMUNITY)
Admission: RE | Admit: 2016-12-10 | Discharge: 2016-12-10 | Disposition: A | Discharge status: Home / Self Care | Payer: Medicare Other | Source: Other Acute Inpatient Hospital | Attending: Urology | Admitting: Urology

## 2016-12-10 DIAGNOSIS — N952 Postmenopausal atrophic vaginitis: Secondary | ICD-10-CM | POA: Diagnosis not present

## 2016-12-10 DIAGNOSIS — N3000 Acute cystitis without hematuria: Secondary | ICD-10-CM | POA: Diagnosis not present

## 2016-12-13 LAB — URINE CULTURE: Culture: >=100000 — AB

## 2016-12-26 ENCOUNTER — Telehealth: Payer: Self-pay | Admitting: Cardiology

## 2016-12-26 NOTE — Telephone Encounter (Signed)
Spoke with pt and reminded pt of remote transmission that is due today. Pt verbalized understanding.   

## 2016-12-30 ENCOUNTER — Ambulatory Visit (INDEPENDENT_AMBULATORY_CARE_PROVIDER_SITE_OTHER): Payer: Medicare Other | Admitting: *Deleted

## 2016-12-30 DIAGNOSIS — I428 Other cardiomyopathies: Secondary | ICD-10-CM

## 2016-12-31 NOTE — Progress Notes (Signed)
Remote ICD transmission.   

## 2017-01-03 ENCOUNTER — Encounter: Payer: Self-pay | Admitting: Cardiology

## 2017-01-13 NOTE — Progress Notes (Signed)
EPIC Encounter for ICM Monitoring No ICM remote transmission received for 12/26/2016 and next ICM transmission scheduled for 01/23/2017.

## 2017-01-23 ENCOUNTER — Ambulatory Visit (INDEPENDENT_AMBULATORY_CARE_PROVIDER_SITE_OTHER): Payer: Medicare Other

## 2017-01-23 DIAGNOSIS — Z95810 Presence of automatic (implantable) cardiac defibrillator: Secondary | ICD-10-CM

## 2017-01-23 DIAGNOSIS — I5022 Chronic systolic (congestive) heart failure: Secondary | ICD-10-CM

## 2017-01-23 LAB — CUP PACEART REMOTE DEVICE CHECK
Battery Remaining Longevity: 70 mo
Battery Remaining Percentage: 91 %
Battery Voltage: 3.11 V
Brady Statistic AP VP Percent: 97 %
Brady Statistic AP VS Percent: 2 %
Brady Statistic AS VP Percent: 1 %
Brady Statistic AS VS Percent: 1 %
Brady Statistic RA Percent Paced: 98 %
Date Time Interrogation Session: 20181209121119
HighPow Impedance: 84 Ohm
HighPow Impedance: 84 Ohm
Implantable Lead Implant Date: 20110912
Implantable Lead Implant Date: 20110912
Implantable Lead Implant Date: 20120710
Implantable Lead Location: 753858
Implantable Lead Location: 753859
Implantable Lead Location: 753860
Implantable Lead Model: 7122
Implantable Pulse Generator Implant Date: 20180621
Lead Channel Impedance Value: 410 Ohm
Lead Channel Impedance Value: 550 Ohm
Lead Channel Impedance Value: 640 Ohm
Lead Channel Pacing Threshold Amplitude: 0.75 V
Lead Channel Pacing Threshold Amplitude: 0.75 V
Lead Channel Pacing Threshold Amplitude: 0.75 V
Lead Channel Pacing Threshold Pulse Width: 0.5 ms
Lead Channel Pacing Threshold Pulse Width: 0.5 ms
Lead Channel Pacing Threshold Pulse Width: 0.5 ms
Lead Channel Sensing Intrinsic Amplitude: 12 mV
Lead Channel Sensing Intrinsic Amplitude: 2.5 mV
Lead Channel Setting Pacing Amplitude: 2 V
Lead Channel Setting Pacing Amplitude: 2 V
Lead Channel Setting Pacing Amplitude: 2 V
Lead Channel Setting Pacing Pulse Width: 0.5 ms
Lead Channel Setting Pacing Pulse Width: 0.5 ms
Lead Channel Setting Sensing Sensitivity: 0.5 mV
Pulse Gen Serial Number: 9761374

## 2017-01-24 NOTE — Progress Notes (Signed)
EPIC Encounter for ICM Monitoring  Patient Name: Melissa Paul is a 72 y.o. female Date: 01/24/2017 Primary Care Physican: Deloria Lair., MD Primary Cardiologist:[place] Electrophysiologist: Allred Dry Weight:184lbs (base weight 181-182 lbs) Bi-V Pacing: 97%      Heart Failure questions reviewed, pt asymptomatic.  Patient is having a lot leg pain but denied any fluid symptoms.   Thoracic impedance normal but was abnormal suggesting fluid accumulation from 01/15/2017 to 01/21/2017.  Prescribed dosage: Furosemide 80 mg twice a day.  Labs: 10/23/2016 Creatinine 0.88, BUN 12, Potassium 4.0, Sodium 138, EGFR > 60 07/11/2016 Creatinine 1.02, BUN 9, Potassium 4.3, Sodium 139, EGFR 54->60 01/04/2016 Creatinine 0.79, BUN 13, Potassium 3.8, Sodium 134, EGFR>60 12/04/2015 Creatinine 0.67, BUN 12, Potassium 3.7, Sodium 138, EGFR >60 06/12/2015 Creatinine 0.90, BUN 11, Potassium 4.1, Sodium 140, EGFR >60  10/31/2016Creatinine 1.05, BUN 13, Potassium 4.2, Sodium 136, EGFR 52->60  Recommendations: No changes.  Encouraged to call for fluid symptoms.  Follow-up plan: ICM clinic phone appointment on 02/25/2017.  Office appointment scheduled 03/05/2017 with Dr. Domenic Polite.  Copy of ICM check sent to Dr. [name].   3 month ICM trend: 01/23/2017    1 Year ICM trend:       [name], RN 01/24/2017 10:40 AM

## 2017-01-31 IMAGING — CR DG CHEST 1V PORT
1 series · 1 of 1 positions shown · non-contrast
Comparison: 08/01/2010 chest radiograph.

CLINICAL DATA: Line placement

EXAM:
PORTABLE CHEST 1 VIEW

[portable]
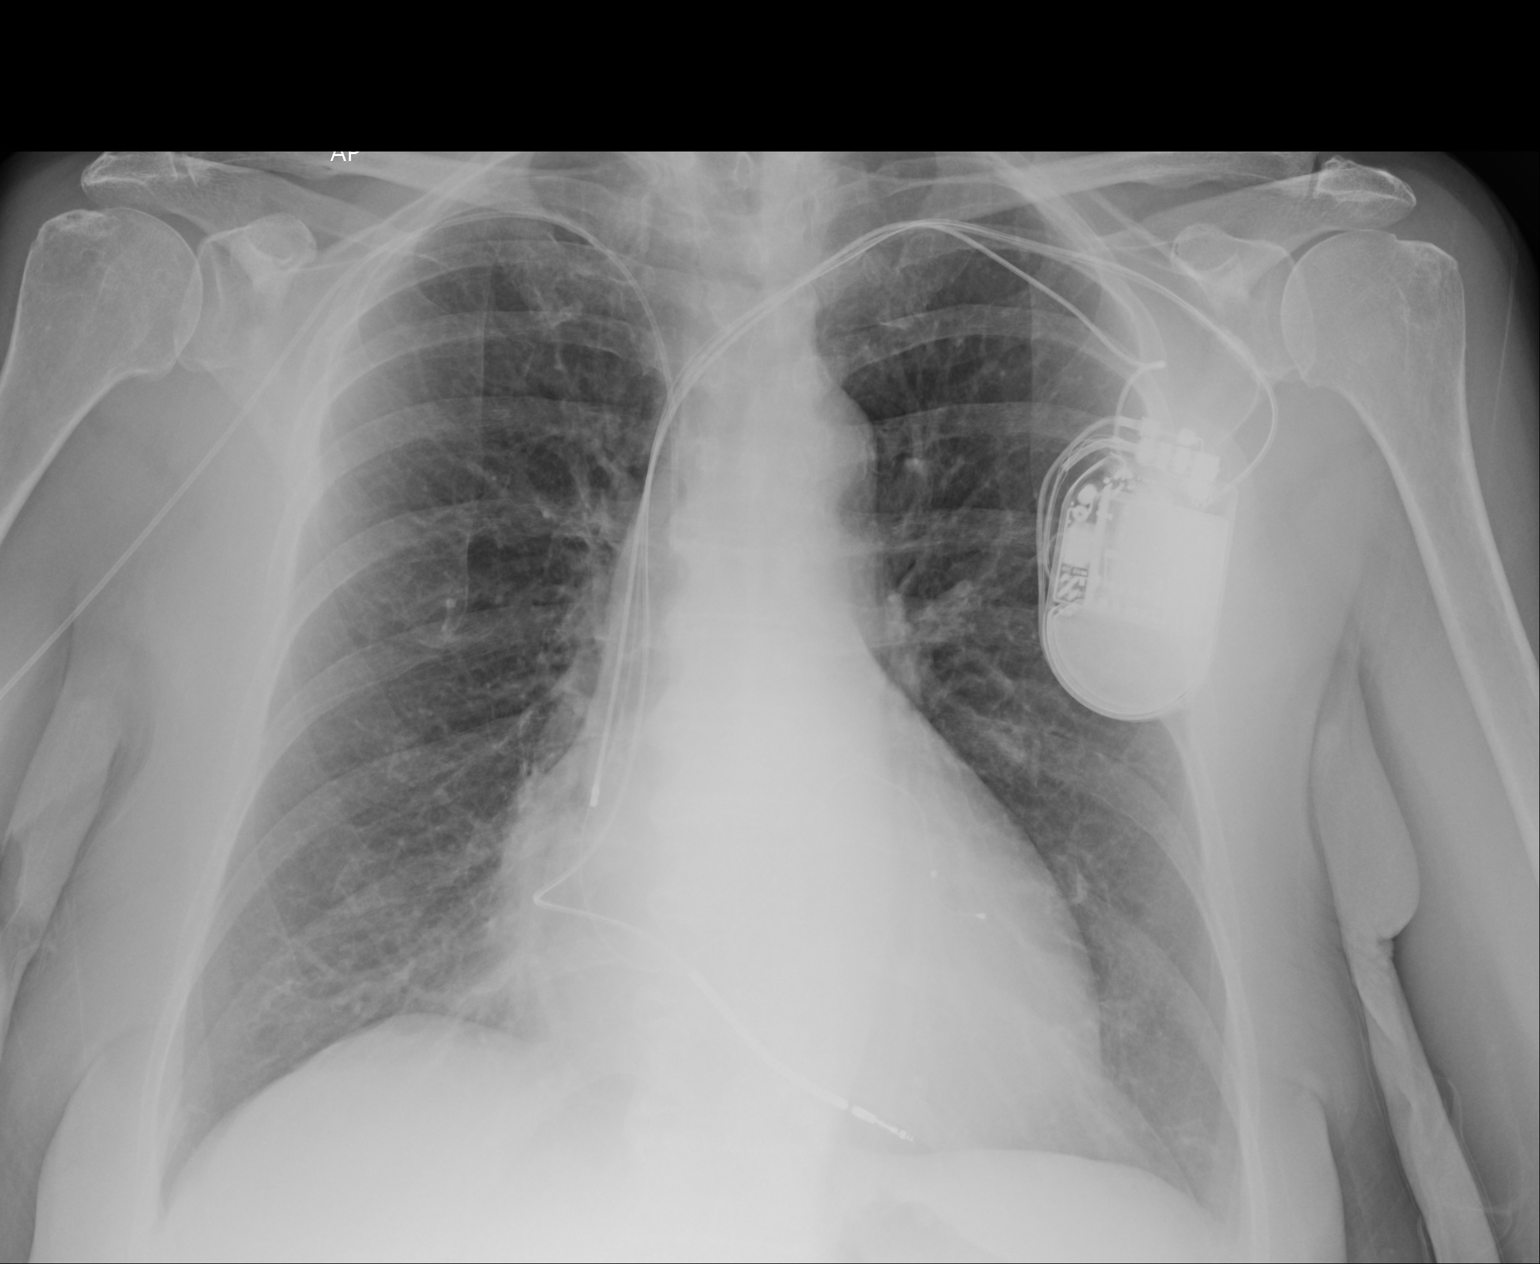

[1 of 1 positions shown; findings below may reference images not displayed]

FINDINGS: Right PICC terminates in the middle third of the superior vena cava.
Stable configuration of 3 lead left subclavian ICD. Stable
cardiomediastinal silhouette with top normal heart size. No
pneumothorax. No pleural effusion. Lungs appear clear, with no acute
consolidative airspace disease and no pulmonary edema.
IMPRESSION: 1. Right PICC terminates in the middle third of the superior vena
cava.
2. No active cardiopulmonary disease.

## 2017-02-25 ENCOUNTER — Ambulatory Visit (INDEPENDENT_AMBULATORY_CARE_PROVIDER_SITE_OTHER): Payer: Medicare Other

## 2017-02-25 ENCOUNTER — Telehealth: Payer: Self-pay

## 2017-02-25 ENCOUNTER — Telehealth: Payer: Self-pay | Admitting: Cardiology

## 2017-02-25 DIAGNOSIS — I5022 Chronic systolic (congestive) heart failure: Secondary | ICD-10-CM | POA: Diagnosis not present

## 2017-02-25 DIAGNOSIS — Z95810 Presence of automatic (implantable) cardiac defibrillator: Secondary | ICD-10-CM | POA: Diagnosis not present

## 2017-02-25 NOTE — Telephone Encounter (Signed)
Remote ICM transmission received.  Attempted call to patient and left message to return call. 

## 2017-02-25 NOTE — Telephone Encounter (Signed)
Spoke with pt and reminded pt of remote transmission that is due today. Pt verbalized understanding.   

## 2017-02-25 NOTE — Progress Notes (Signed)
EPIC Encounter for ICM Monitoring  Patient Name: Melissa Paul is a 72 y.o. female Date: 02/25/2017 Primary Care Physican: Deloria Lair., MD Primary Cardiologist:[place] Electrophysiologist: Allred Dry Weight:Previous weight 184lbs (base weight 181-182 lbs) Bi-V Pacing: 97%        Attempted call to patient and unable to reach.  Left message to return call.  Transmission reviewed.    Thoracic impedance normal.  Prescribed dosage: Furosemide 80 mgtwice a day.  Labs: 10/23/2016 Creatinine 0.88, BUN 12, Potassium 4.0, Sodium 138, EGFR > 60 07/11/2016 Creatinine 1.02, BUN 9, Potassium 4.3, Sodium 139, EGFR 54->60 01/04/2016 Creatinine 0.79, BUN 13, Potassium 3.8, Sodium 134, EGFR>60 12/04/2015 Creatinine 0.67, BUN 12, Potassium 3.7, Sodium 138, EGFR >60 06/12/2015 Creatinine 0.90, BUN 11, Potassium 4.1, Sodium 140, EGFR >60  10/31/2016Creatinine 1.05, BUN 13, Potassium 4.2, Sodium 136, EGFR 52->60  Recommendations: NONE - Unable to reach.  Follow-up plan: ICM clinic phone appointment on 03/31/2017.    Copy of ICM check sent to Dr. [name].   3 month ICM trend: 02/25/2017    1 Year ICM trend:       [name], RN 02/25/2017 9:42 AM

## 2017-02-27 ENCOUNTER — Encounter (HOSPITAL_COMMUNITY): Payer: Self-pay

## 2017-02-27 ENCOUNTER — Emergency Department (HOSPITAL_COMMUNITY)
Admission: EM | Admit: 2017-02-27 | Discharge: 2017-02-27 | Disposition: A | Discharge status: Home / Self Care | Payer: Medicare Other | Attending: Emergency Medicine | Admitting: Emergency Medicine

## 2017-02-27 ENCOUNTER — Other Ambulatory Visit: Payer: Self-pay

## 2017-02-27 ENCOUNTER — Emergency Department (HOSPITAL_COMMUNITY): Payer: Medicare Other

## 2017-02-27 DIAGNOSIS — Z79899 Other long term (current) drug therapy: Secondary | ICD-10-CM | POA: Diagnosis not present

## 2017-02-27 DIAGNOSIS — E039 Hypothyroidism, unspecified: Secondary | ICD-10-CM | POA: Diagnosis not present

## 2017-02-27 DIAGNOSIS — Z87891 Personal history of nicotine dependence: Secondary | ICD-10-CM | POA: Diagnosis not present

## 2017-02-27 DIAGNOSIS — Z7982 Long term (current) use of aspirin: Secondary | ICD-10-CM | POA: Insufficient documentation

## 2017-02-27 DIAGNOSIS — I5022 Chronic systolic (congestive) heart failure: Secondary | ICD-10-CM | POA: Diagnosis not present

## 2017-02-27 DIAGNOSIS — R079 Chest pain, unspecified: Secondary | ICD-10-CM | POA: Diagnosis present

## 2017-02-27 DIAGNOSIS — I480 Paroxysmal atrial fibrillation: Secondary | ICD-10-CM | POA: Insufficient documentation

## 2017-02-27 DIAGNOSIS — R0789 Other chest pain: Secondary | ICD-10-CM

## 2017-02-27 LAB — HEPATIC FUNCTION PANEL
ALT: 13 U/L — ABNORMAL LOW (ref 14–54)
AST: 20 U/L (ref 15–41)
Albumin: 4.4 g/dL (ref 3.5–5.0)
Alkaline Phosphatase: 70 U/L (ref 38–126)
Bilirubin, Direct: 0.2 mg/dL (ref 0.1–0.5)
Indirect Bilirubin: 0.8 mg/dL (ref 0.3–0.9)
Total Bilirubin: 1 mg/dL (ref 0.3–1.2)
Total Protein: 7.4 g/dL (ref 6.5–8.1)

## 2017-02-27 LAB — BASIC METABOLIC PANEL
Anion gap: 12 (ref 5–15)
BUN: 10 mg/dL (ref 6–20)
CO2: 28 mmol/L (ref 22–32)
Calcium: 9.3 mg/dL (ref 8.9–10.3)
Chloride: 98 mmol/L — ABNORMAL LOW (ref 101–111)
Creatinine, Ser: 0.95 mg/dL (ref 0.44–1.00)
GFR calc Af Amer: >60 mL/min (ref >60)
GFR calc non Af Amer: 58 mL/min — ABNORMAL LOW (ref >60)
Glucose, Bld: 113 mg/dL — ABNORMAL HIGH (ref 65–99)
Potassium: 3.8 mmol/L (ref 3.5–5.1)
Sodium: 138 mmol/L (ref 135–145)

## 2017-02-27 LAB — CBC
HCT: 41.5 % (ref 36.0–46.0)
Hemoglobin: 13.9 g/dL (ref 12.0–15.0)
MCH: 30.2 pg (ref 26.0–34.0)
MCHC: 33.5 g/dL (ref 30.0–36.0)
MCV: 90.2 fL (ref 78.0–100.0)
Platelets: 285 10*3/uL (ref 150–400)
RBC: 4.6 MIL/uL (ref 3.87–5.11)
RDW: 13.1 % (ref 11.5–15.5)
WBC: 6.8 10*3/uL (ref 4.0–10.5)

## 2017-02-27 LAB — LIPASE, BLOOD: Lipase: 24 U/L (ref 11–51)

## 2017-02-27 LAB — TROPONIN I
Troponin I: <0.03 ng/mL (ref <0.03)
Troponin I: <0.03 ng/mL

## 2017-02-27 MED ORDER — TRAMADOL HCL 50 MG PO TABS
50.0000 mg | ORAL_TABLET | Freq: Once | ORAL | Status: AC
  Administered 2017-02-27: 50 mg via ORAL
  Filled 2017-02-27: qty 1

## 2017-02-27 MED ORDER — METHOCARBAMOL 500 MG PO TABS: 500.0000 mg | ORAL_TABLET | Freq: Three times a day (TID) | ORAL | 0 refills | Status: DC | PRN

## 2017-02-27 MED ORDER — ACETAMINOPHEN 325 MG PO TABS: 650.0000 mg | ORAL_TABLET | ORAL | Status: DC | PRN

## 2017-02-27 MED ORDER — TRAMADOL HCL 50 MG PO TABS: 50.0000 mg | ORAL_TABLET | Freq: Four times a day (QID) | ORAL | 0 refills | Status: DC | PRN

## 2017-02-27 NOTE — ED Provider Notes (Signed)
[facility] EMERGENCY DEPARTMENT Provider Note   CSN: [id number] Arrival date & time: 02/27/17  1004     History   Chief Complaint Chief Complaint  Patient presents with  . Chest Pain    HPI Melissa Paul is a 72 y.o. female.  HPI Patient with history of heart failure presents with right-sided chest pain for the past 2 weeks.  States the pain is worse with deep breathing and with movement.  No known trauma or heavy lifting.  States she has ongoing shortness of breath especially with exertion which may have slightly worsened over the last few days.  She denies any fever or chills.  She has ongoing bilateral lower extremity swelling which is improved at this time. Past Medical History:  Diagnosis Date  . Chronic systolic heart failure ([place])   . Glucose intolerance (pre-diabetes)   . Hyperlipemia   . Hypothyroidism   . LBBB (left bundle branch block)   . Mitral regurgitation    Mild to Moderate  . Nonischemic cardiomyopathy ([place])    LVEF 35-40% by echo 8/13  . Paroxysmal atrial fibrillation (HCC)    Brief episodes by device interrogation  . UTI (urinary tract infection)    Recurrent  . Ventricular tachycardia ([place])    ICD therapy for VT    Patient Active Problem List   Diagnosis Date Noted  . Acute on chronic systolic ACC/AHA stage C congestive heart failure ([place]) 06/08/2014  . Abnormal myocardial perfusion study   . Exertional dyspnea - NYHA III CHF 04/11/2014  . Abnormal nuclear stress test 04/11/2014  . Ventricular tachyarrhythmia ([place]) 03/17/2014  . Atrial fibrillation ([place]) 10/10/2011  . Chronic hypotension 03/14/2011  . 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 MITRAL REGURGITATION 07/14/2009  . Chronic systolic heart failure ([place]) 07/14/2009  . Nonischemic cardiomyopathy ([place]) 11/29/2008  . LBBB 11/29/2008    Past Surgical History:  Procedure Laterality Date  . APPENDECTOMY    . BIV ICD GENERATOR CHANGEOUT N/A 07/11/2016   Procedure: BiV ICD Generator Changeout;  Surgeon: Thompson Grayer, MD;  Location: [place] CV LAB;  Service: Cardiovascular;  Laterality: N/A;  . CARDIAC DEFIBRILLATOR PLACEMENT  09/2009   St. Jude BiV ICD by Dr. Sandria Manly class III  . CHOLECYSTECTOMY    . KNEE ARTHROSCOPY     Left  . LEFT AND RIGHT HEART CATHETERIZATION WITH CORONARY ANGIOGRAM N/A 04/12/2014   Procedure: LEFT AND RIGHT HEART CATHETERIZATION WITH CORONARY ANGIOGRAM;  Surgeon: Leonie Man, MD;  Location: [facility] CATH LAB;  Service: Cardiovascular;  Laterality: N/A;  . TOTAL ABDOMINAL HYSTERECTOMY     (R) ovary removed    OB History    No data available       Home Medications    Prior to Admission medications   Medication Sig Start Date End Date Taking? Authorizing Provider  aspirin EC 81 MG tablet Take 81 mg by mouth daily.   Yes [provider]  carvedilol (COREG) 12.5 MG tablet TAKE ONE TABLET BY MOUTH TWICE DAILY 11/21/16  Yes Satira Sark, MD  cetirizine (ZYRTEC) 10 MG tablet Take 10 mg by mouth daily as needed for allergies or rhinitis.    Yes [provider]  diazepam (VALIUM) 5 MG tablet Take 5 mg by mouth 3 (three) times daily.    Yes [provider]  furosemide (LASIX) 80 MG tablet Take 1 tablet (80 mg total) by mouth 2 (two) times daily. 10/17/16 02/27/17 Yes Satira Sark, MD  levothyroxine (SYNTHROID,  LEVOTHROID) 75 MCG tablet Take 37.5 mcg by mouth daily.    Yes [provider]  metFORMIN (GLUCOPHAGE) 500 MG tablet Take 500 mg by mouth 2 (two) times daily.   Yes [provider]  ranitidine (ZANTAC) 150 MG capsule Take 150 mg by mouth daily as needed (for heartburn/indigestion).    Yes [provider]  spironolactone (ALDACTONE) 25 MG tablet Take 25 mg by mouth daily.    Yes [provider]  methocarbamol (ROBAXIN) 500 MG tablet Take 1 tablet (500 mg total) by mouth every 8 (eight) hours as needed for muscle spasms. 02/27/17   Julianne Rice, MD  traMADol (ULTRAM) 50 MG tablet Take 1 tablet (50 mg total) by mouth every 6 (six) hours as needed. 02/27/17   Julianne Rice, MD    Family History Family History  Problem Relation Age of Onset  . CVA Mother        multiple  . Heart Problems Mother        "weak heart"  . Heart disease Maternal Grandmother   . Heart disease Maternal Grandfather   . Diabetes Neg Hx   . Hypertension Neg Hx   . Coronary artery disease Neg Hx     Social History Social History   Tobacco Use  . Smoking status: Former Smoker    Packs/day: 0.30    Years: 20.00    Pack years: 6.00    Types: Cigarettes    Start date: 08/03/1949    Last attempt to quit: 01/22/1996    Years since quitting: 21.1  . Smokeless tobacco: Never Used  Substance Use Topics  . Alcohol use: No    Alcohol/week: 0.0 oz  . Drug use: No     Allergies   Bactrim; Cephalexin; Chlorhexidine gluconate; Clindamycin; Contrast media [iodinated diagnostic agents]; Doxycycline; Latex; Levofloxacin; Nitrofurantoin monohyd macro; and Penicillins   Review of Systems Review of Systems  Constitutional: Negative for chills and fever.  Respiratory: Positive for cough and shortness of breath. Negative for wheezing.   Cardiovascular: Positive for chest pain and leg swelling. Negative for palpitations.  Gastrointestinal: Positive for abdominal pain. Negative for diarrhea, nausea and vomiting.  Genitourinary: Negative for dysuria, flank pain and frequency.  Musculoskeletal: Positive for myalgias. Negative for back pain, neck pain and neck stiffness.  Skin: Negative for rash and wound.  Neurological: Negative for dizziness, weakness, light-headedness, numbness and headaches.  All other systems reviewed and are negative.    Physical Exam Updated Vital Signs BP 125/68   Pulse 84   Temp 98 F (36.7 C) (Oral)   Resp 18   Ht 5\' 2"  (1.575 m)   Wt 85.3 kg (188 lb)   SpO2 100%   BMI 34.39 kg/m   Physical Exam  Constitutional: She  is oriented to person, place, and time. She appears well-developed and well-nourished.  Non-toxic appearance. She does not appear ill. No distress.  HENT:  Head: Normocephalic and atraumatic.  Mouth/Throat: Oropharynx is clear and moist. No oropharyngeal exudate.  Eyes: EOM are normal. Pupils are equal, round, and reactive to light.  Neck: Normal range of motion. Neck supple.  Cardiovascular: Normal rate and regular rhythm. Exam reveals no gallop and no friction rub.  No murmur heard. Pulmonary/Chest: Effort normal. She has rales.  Few scattered rhonchi.  Patient has tenderness to palpation over the right chest wall.  No crepitance or deformity.  Abdominal: Soft. Bowel sounds are normal. There is tenderness. There is no rebound and no guarding.  Tenderness  to palpation in the right upper quadrant and epigastric region.  Musculoskeletal: Normal range of motion. She exhibits no edema or tenderness.  No lower extremity asymmetry or tenderness.  Distal pulses intact.  Lymphadenopathy:    She has no cervical adenopathy.  Neurological: She is alert and oriented to person, place, and time.  Moves all extremities without focal deficit.  Sensation fully intact.  Skin: Skin is warm and dry. Capillary refill takes less than 2 seconds. No rash noted. No erythema.  Psychiatric: She has a normal mood and affect. Her behavior is normal.  Nursing note and vitals reviewed.    ED Treatments / Results  Labs (all labs ordered are listed, but only abnormal results are displayed) Labs Reviewed  BASIC METABOLIC PANEL - Abnormal; Notable for the following components:      Result Value   Chloride 98 (*)    Glucose, Bld 113 (*)    GFR calc non Af Amer 58 (*)    All other components within normal limits  HEPATIC FUNCTION PANEL - Abnormal; Notable for the following components:   ALT 13 (*)    All other components within normal limits  CBC  TROPONIN I  LIPASE, BLOOD  TROPONIN I    EKG  EKG  Interpretation  Date/Time:  Thursday February 27 2017 10:22:46 EST Ventricular Rate:  82 PR Interval:    QRS Duration: 146 QT Interval:  440 QTC Calculation: 598 R Axis:   -89 Text Interpretation:  Atrial-sensed ventricular-paced complexes No further analysis attempted due to paced rhythm Confirmed by Julianne Rice [id number]) on 02/27/2017 11:35:26 AM       Radiology Dg Chest 2 View  Result Date: 02/27/2017 CLINICAL DATA:  Right-sided shoulder pain with shortness of breath for 2 weeks EXAM: CHEST  2 VIEW COMPARISON:  06/27/2016 FINDINGS: Cardiac shadow is stable. Defibrillator is again noted. Lungs are clear bilaterally. No bony abnormality is noted IMPRESSION: No active cardiopulmonary disease. Electronically Signed   By: Inez Catalina M.D.   On: 02/27/2017 11:31    Procedures Procedures (including critical care time)  Medications Ordered in ED Medications  acetaminophen (TYLENOL) tablet 650 mg (not administered)  traMADol (ULTRAM) tablet 50 mg (50 mg Oral Given 02/27/17 1302)     Initial Impression / Assessment and Plan / ED Course  I have reviewed the triage vital signs and the nursing notes.  Pertinent labs & imaging results that were available during my care of the patient were reviewed by me and considered in my medical decision making (see chart for details).    Pending x2 is normal.  Low suspicion for PE.  Chest x-ray without acute findings.  Suspect symptoms likely related to chest wall etiology.  Will treat symptomatically.  Advised to follow-up with her primary physician and return precautions given.   Final Clinical Impressions(s) / ED Diagnoses   Final diagnoses:  Chest wall pain    ED Discharge Orders        Ordered    traMADol (ULTRAM) 50 MG tablet  Every 6 hours PRN     02/27/17 1355    methocarbamol (ROBAXIN) 500 MG tablet  Every 8 hours PRN     02/27/17 1355       Julianne Rice, MD 02/27/17 1356

## 2017-02-27 NOTE — ED Triage Notes (Signed)
Patient complains of pain that started in right shoulder that radiates around into right breast x2 weeks. Pain is worse when breathing in. Also reports of feeling short of breath. NAD noted.

## 2017-03-03 NOTE — Progress Notes (Signed)
Cardiology Office Note  Date: 03/05/2017   ID: Melissa Paul, DOB 02-08-1945, MRN [id number]  PCP: Melissa Lair., MD  Primary Cardiologist: Melissa Lesches, MD   Chief Complaint  Patient presents with  . Cardiomyopathy    History of Present Illness: Melissa Paul is a 72 y.o. female last seen in October 2018.  She presents today for a routine follow-up visit.  She was seen in the ER recently with pleuritic chest pain, no definite acute findings with suspicion for inflammatory etiology.  She does not report any active cough or hemoptysis, no fevers or chills.  Also states that she has had follow-up with her PCP since then.  From a cardiac perspective, her weight is down about 10 pounds and she has had no significant leg swelling on current regimen.  She follows with Dr. Rayann Paul in the device clinic, [place] biventricular ICD in place.  Recent device check revealed normal thoracic impedance.  Echocardiogram from October 2018 showed LVEF 40-45% range.   Current cardiac regimen includes Coreg, Lasix, and Aldactone.  I did review her recent lab work.  Renal function and potassium were normal.  Past Medical History:  Diagnosis Date  . Chronic systolic heart failure ([place])   . Glucose intolerance (pre-diabetes)   . Hyperlipemia   . Hypothyroidism   . LBBB (left bundle branch block)   . Mitral regurgitation    Mild to Moderate  . Nonischemic cardiomyopathy ([place])    LVEF 35-40% by echo 8/13  . Paroxysmal atrial fibrillation (HCC)    Brief episodes by device interrogation  . UTI (urinary tract infection)    Recurrent  . Ventricular tachycardia ([place])    ICD therapy for VT    Past Surgical History:  Procedure Laterality Date  . APPENDECTOMY    . BIV ICD GENERATOR CHANGEOUT N/A 07/11/2016   Procedure: BiV ICD Generator Changeout;  Surgeon: Melissa Grayer, MD;  Location: [place] CV LAB;  Service: Cardiovascular;  Laterality: N/A;  . CARDIAC DEFIBRILLATOR PLACEMENT   09/2009   St. Jude BiV ICD by Dr. Sandria Paul class III  . CHOLECYSTECTOMY    . KNEE ARTHROSCOPY     Left  . LEFT AND RIGHT HEART CATHETERIZATION WITH CORONARY ANGIOGRAM N/A 04/12/2014   Procedure: LEFT AND RIGHT HEART CATHETERIZATION WITH CORONARY ANGIOGRAM;  Surgeon: Melissa Man, MD;  Location: [facility] CATH LAB;  Service: Cardiovascular;  Laterality: N/A;  . TOTAL ABDOMINAL HYSTERECTOMY     (R) ovary removed    Current Outpatient Medications  Medication Sig Dispense Refill  . aspirin EC 81 MG tablet Take 81 mg by mouth daily.    . carvedilol (COREG) 12.5 MG tablet TAKE ONE TABLET BY MOUTH TWICE DAILY 180 tablet 3  . cetirizine (ZYRTEC) 10 MG tablet Take 10 mg by mouth daily as needed for allergies or rhinitis.     Marland Kitchen diazepam (VALIUM) 5 MG tablet Take 5 mg by mouth 3 (three) times daily.     . furosemide (LASIX) 80 MG tablet Take 1 tablet (80 mg total) by mouth 2 (two) times daily. 180 tablet 1  . levothyroxine (SYNTHROID, LEVOTHROID) 75 MCG tablet Take 37.5 mcg by mouth daily.     . metFORMIN (GLUCOPHAGE) 500 MG tablet Take 500 mg by mouth 2 (two) times daily.    . methocarbamol (ROBAXIN) 500 MG tablet Take 1 tablet (500 mg total) by mouth every 8 (eight) hours as needed for muscle spasms. 20 tablet 0  . ranitidine (ZANTAC) 150  MG capsule Take 150 mg by mouth daily as needed (for heartburn/indigestion).     Marland Kitchen spironolactone (ALDACTONE) 25 MG tablet Take 25 mg by mouth daily.     . traMADol (ULTRAM) 50 MG tablet Take 1 tablet (50 mg total) by mouth every 6 (six) hours as needed. 15 tablet 0   No current facility-administered medications for this visit.    Allergies:  Bactrim; Cephalexin; Chlorhexidine gluconate; Clindamycin; Contrast media [iodinated diagnostic agents]; Doxycycline; Latex; Levofloxacin; Nitrofurantoin monohyd macro; and Penicillins   Social History: The patient  reports that she quit smoking about 21 years ago. Her smoking use included cigarettes. She started smoking about  67 years ago. She has a 6.00 pack-year smoking history. she has never used smokeless tobacco. She reports that she does not drink alcohol or use drugs.   ROS:  Please see the history of present illness. Otherwise, complete review of systems is positive for improving right sided pleuritic discomfort.  All other systems are reviewed and negative.   Physical Exam: VS:  BP 98/68   Pulse 81   Ht 5' 2.5" (1.588 m)   Wt 178 lb (80.7 kg)   SpO2 98%   BMI 32.04 kg/m , BMI Body mass index is 32.04 kg/m.  Wt Readings from Last 3 Encounters:  03/05/17 178 lb (80.7 kg)  02/27/17 188 lb (85.3 kg)  11/08/16 188 lb (85.3 kg)    General: Patient appears comfortable at rest. HEENT: Conjunctiva and lids normal, oropharynx clear. Neck: Supple, no elevated JVP or carotid bruits, no thyromegaly. Lungs: Clear to auscultation, nonlabored breathing at rest. Cardiac: Regular rate and rhythm, no S3 or significant systolic murmur, no pericardial rub. Abdomen: Soft, nontender, bowel sounds present. Extremities: No pitting edema, distal pulses 2+. Skin: Warm and dry. Musculoskeletal: No kyphosis. Neuropsychiatric: Alert and oriented x3, affect grossly appropriate.  ECG: I personally reviewed the tracing from 02/27/2017 which showed dual-chamber pacing.  Recent Labwork: 02/27/2017: ALT 13; AST 20; BUN 10; Creatinine, Ser 0.95; Hemoglobin 13.9; Platelets 285; Potassium 3.8; Sodium 138   Other Studies Reviewed Today:  Echocardiogram 10/23/2016: Study Conclusions  - Left ventricle: The cavity size was mildly dilated. Wall   thickness was increased in a pattern of mild LVH. Systolic   function was mildly to moderately reduced. The estimated ejection   fraction was in the range of 40% to 45%. Diastolic function is   abnormal, indeterminant grade. Diffuse hypokinesis. - Aortic valve: Mildly calcified annulus. Trileaflet; normal   thickness leaflets. Valve area (VTI): 2.08 cm^2. Valve area   (Vmax): 1.94 cm^2.  Valve area (Vmean): 1.91 cm^2. - Mitral valve: Mildly calcified annulus. Normal thickness leaflets   . There was mild regurgitation. - Right ventricle: Systolic function was mildly reduced. - Technically adequate study.  Assessment and Plan:  1.  Chronic combined heart failure, overall doing well.  Her weight is down impedance was normal.  Plan to continue current diuretic regimen.  Renal function and potassium normal.  2.  Nonischemic cardiomyopathy with LVEF 40-45%.  3.  St. Jude biventricular ICD in place.  She continues to follow in the device clinic with Dr. Rayann Paul.  4.  Mitral regurgitation, only mild by most recent assessment.  Current medicines were reviewed with the patient today.  Disposition: Follow-up in 6 months.  Signed, Satira Sark, MD, [facility] 03/05/2017 2:03 PM    [facility] [place], [place],  [address] Phone: [phone]; Fax: [phone]

## 2017-03-05 ENCOUNTER — Ambulatory Visit (INDEPENDENT_AMBULATORY_CARE_PROVIDER_SITE_OTHER): Payer: Medicare Other | Admitting: Cardiology

## 2017-03-05 ENCOUNTER — Telehealth: Payer: Self-pay

## 2017-03-05 ENCOUNTER — Encounter: Payer: Self-pay | Admitting: Cardiology

## 2017-03-05 VITALS — BP 98/68 | HR 81 | Ht 62.5 in | Wt 178.0 lb

## 2017-03-05 DIAGNOSIS — I340 Nonrheumatic mitral (valve) insufficiency: Secondary | ICD-10-CM | POA: Diagnosis not present

## 2017-03-05 DIAGNOSIS — Z95810 Presence of automatic (implantable) cardiac defibrillator: Secondary | ICD-10-CM | POA: Diagnosis not present

## 2017-03-05 DIAGNOSIS — I428 Other cardiomyopathies: Secondary | ICD-10-CM

## 2017-03-05 DIAGNOSIS — I5042 Chronic combined systolic (congestive) and diastolic (congestive) heart failure: Secondary | ICD-10-CM | POA: Diagnosis not present

## 2017-03-05 NOTE — Telephone Encounter (Signed)
Ms. Belli has developed shortness of breath and difficulty getting around over the last month. She believes she needs her lower pacing rate decreased from 80bpm. Her lower rate has been set at 80bpm since generator replacement in June 2018. I advised that she may need further evaluation since her symptoms have recently developed. I will discuss with JA and call back. She needs to leave the house and wants me to leave a message.  Discussed with JA- ok to decrease lower rate to 70bpm and reassess symptoms. Scheduled 03/10/17 at 4pm- LMOM to call back if this needs to be rescheduled.

## 2017-03-05 NOTE — Telephone Encounter (Signed)
Patient states she feels like her pacemaker is set too high and would like to speak with someone in the device clinic.

## 2017-03-05 NOTE — Patient Instructions (Signed)

## 2017-03-26 ENCOUNTER — Ambulatory Visit (INDEPENDENT_AMBULATORY_CARE_PROVIDER_SITE_OTHER): Payer: Self-pay | Admitting: *Deleted

## 2017-03-26 DIAGNOSIS — Z95810 Presence of automatic (implantable) cardiac defibrillator: Secondary | ICD-10-CM

## 2017-03-26 DIAGNOSIS — I5042 Chronic combined systolic (congestive) and diastolic (congestive) heart failure: Secondary | ICD-10-CM

## 2017-03-26 LAB — CUP PACEART INCLINIC DEVICE CHECK
Battery Remaining Longevity: 62 mo
Brady Statistic RA Percent Paced: 98 %
Brady Statistic RV Percent Paced: 97 %
Date Time Interrogation Session: 20190306104355
HighPow Impedance: 73.125
Implantable Lead Implant Date: 20110912
Implantable Lead Implant Date: 20110912
Implantable Lead Implant Date: 20120710
Implantable Lead Location: 753858
Implantable Lead Location: 753859
Implantable Lead Location: 753860
Implantable Lead Model: 7122
Implantable Pulse Generator Implant Date: 20180621
Lead Channel Impedance Value: 375 Ohm
Lead Channel Impedance Value: 462.5 Ohm
Lead Channel Impedance Value: 562.5 Ohm
Lead Channel Pacing Threshold Amplitude: 0.75 V
Lead Channel Pacing Threshold Pulse Width: 0.5 ms
Lead Channel Sensing Intrinsic Amplitude: 12 mV
Lead Channel Sensing Intrinsic Amplitude: 3.7 mV
Lead Channel Setting Pacing Amplitude: 2 V
Lead Channel Setting Pacing Amplitude: 2 V
Lead Channel Setting Pacing Amplitude: 2 V
Lead Channel Setting Pacing Pulse Width: 0.5 ms
Lead Channel Setting Pacing Pulse Width: 0.5 ms
Lead Channel Setting Sensing Sensitivity: 0.5 mV
Pulse Gen Serial Number: 9761374

## 2017-03-26 NOTE — Progress Notes (Signed)
Melissa Paul seen in [facility] today for reprogramming of lower rate. Lower rate decreased from 80bpm to 70bpm. Melissa Paul was pacing at the Sensor Indicated Rate ~100bpm after walking from the waiting room at a normal pace. Slope decreased from 10 to 9 so as to not make sensor so aggressive. Follow up via [place] 03/31/17, [place] with JA in [facility] 10/24/17.

## 2017-03-31 ENCOUNTER — Ambulatory Visit (INDEPENDENT_AMBULATORY_CARE_PROVIDER_SITE_OTHER): Payer: Medicare Other | Admitting: *Deleted

## 2017-03-31 DIAGNOSIS — Z95810 Presence of automatic (implantable) cardiac defibrillator: Secondary | ICD-10-CM

## 2017-03-31 DIAGNOSIS — I5042 Chronic combined systolic (congestive) and diastolic (congestive) heart failure: Secondary | ICD-10-CM

## 2017-03-31 DIAGNOSIS — I428 Other cardiomyopathies: Secondary | ICD-10-CM

## 2017-03-31 NOTE — Progress Notes (Signed)
EPIC Encounter for ICM Monitoring  Patient Name: Melissa Paul is a 72 y.o. female Date: 03/31/2017 Primary Care Physican: Deloria Lair., MD Primary Cardiologist:McDowell Electrophysiologist: Allred Dry Weight:182lbs (base weight 181-182 lbs) Bi-V Pacing: 98%        Heart Failure questions reviewed, pt asymptomatic.   Thoracic impedance normal.  Prescribed dosage: Furosemide 80 mgtwice a day.  Labs: 02/27/2017 Creatinine 0.95, BUN 10, Potassium 3.8, Sodium 138, EGFR 58->60 10/23/2016 Creatinine 0.88, BUN 12, Potassium 4.0, Sodium 138, EGFR > 60 07/11/2016 Creatinine 1.02, BUN 9, Potassium 4.3, Sodium 139, EGFR 54->60 01/04/2016 Creatinine 0.79, BUN 13, Potassium 3.8, Sodium 134, EGFR>60 12/04/2015 Creatinine 0.67, BUN 12, Potassium 3.7, Sodium 138, EGFR >60 06/12/2015 Creatinine 0.90, BUN 11, Potassium 4.1, Sodium 140, EGFR >60  10/31/2016Creatinine 1.05, BUN 13, Potassium 4.2, Sodium 136, EGFR 52->60  Recommendations: No changes.   Encouraged to call for fluid symptoms.  Follow-up plan: ICM clinic phone appointment on 05/01/2017.    Copy of ICM check sent to Dr. [name].   3 month ICM trend: 03/31/2017    1 Year ICM trend:       [name], RN 03/31/2017 1:53 PM

## 2017-03-31 NOTE — Progress Notes (Signed)
Remote ICD transmission.   

## 2017-04-02 ENCOUNTER — Encounter: Payer: Self-pay | Admitting: Cardiology

## 2017-04-19 LAB — CUP PACEART REMOTE DEVICE CHECK
Battery Remaining Longevity: 67 mo
Battery Remaining Percentage: 87 %
Battery Voltage: 3.04 V
Brady Statistic AP VP Percent: 95 %
Brady Statistic AP VS Percent: 1.2 %
Brady Statistic AS VP Percent: 2.2 %
Brady Statistic AS VS Percent: 1 %
Brady Statistic RA Percent Paced: 96 %
Date Time Interrogation Session: 20190311084346
HighPow Impedance: 88 Ohm
HighPow Impedance: 88 Ohm
Implantable Lead Implant Date: 20110912
Implantable Lead Implant Date: 20110912
Implantable Lead Implant Date: 20120710
Implantable Lead Location: 753858
Implantable Lead Location: 753859
Implantable Lead Location: 753860
Implantable Lead Model: 7122
Implantable Pulse Generator Implant Date: 20180621
Lead Channel Impedance Value: 350 Ohm
Lead Channel Impedance Value: 460 Ohm
Lead Channel Impedance Value: 550 Ohm
Lead Channel Pacing Threshold Amplitude: 0.75 V
Lead Channel Pacing Threshold Amplitude: 0.75 V
Lead Channel Pacing Threshold Amplitude: 0.75 V
Lead Channel Pacing Threshold Pulse Width: 0.5 ms
Lead Channel Pacing Threshold Pulse Width: 0.5 ms
Lead Channel Pacing Threshold Pulse Width: 0.5 ms
Lead Channel Sensing Intrinsic Amplitude: 12 mV
Lead Channel Sensing Intrinsic Amplitude: 2.7 mV
Lead Channel Setting Pacing Amplitude: 2 V
Lead Channel Setting Pacing Amplitude: 2 V
Lead Channel Setting Pacing Amplitude: 2 V
Lead Channel Setting Pacing Pulse Width: 0.5 ms
Lead Channel Setting Pacing Pulse Width: 0.5 ms
Lead Channel Setting Sensing Sensitivity: 0.5 mV
Pulse Gen Serial Number: 9761374

## 2017-05-01 ENCOUNTER — Ambulatory Visit (INDEPENDENT_AMBULATORY_CARE_PROVIDER_SITE_OTHER): Payer: Medicare Other

## 2017-05-01 DIAGNOSIS — Z95810 Presence of automatic (implantable) cardiac defibrillator: Secondary | ICD-10-CM | POA: Diagnosis not present

## 2017-05-01 DIAGNOSIS — I5042 Chronic combined systolic (congestive) and diastolic (congestive) heart failure: Secondary | ICD-10-CM | POA: Diagnosis not present

## 2017-05-01 NOTE — Progress Notes (Signed)
EPIC Encounter for ICM Monitoring  Patient Name: Melissa Paul is a 72 y.o. female Date: 05/01/2017 Primary Care Physican: Deloria Lair., MD Primary Cardiologist:McDowell Electrophysiologist: Allred Dry Weight:182lbs (base weight 181-182 lbs) Bi-V Pacing: 97%       Heart Failure questions reviewed, pt asymptomatic.   Thoracic impedance normal.  Prescribed dosage: Furosemide 80 mgtwice a day.  Labs: 02/27/2017 Creatinine 0.95, BUN 10, Potassium 3.8, Sodium 138, EGFR 58->60 10/23/2016 Creatinine 0.88, BUN 12, Potassium 4.0, Sodium 138, EGFR > 60 07/11/2016 Creatinine 1.02, BUN 9, Potassium 4.3, Sodium 139, EGFR 54->60 01/04/2016 Creatinine 0.79, BUN 13, Potassium 3.8, Sodium 134, EGFR>60 12/04/2015 Creatinine 0.67, BUN 12, Potassium 3.7, Sodium 138, EGFR >60 06/12/2015 Creatinine 0.90, BUN 11, Potassium 4.1, Sodium 140, EGFR >60  10/31/2016Creatinine 1.05, BUN 13, Potassium 4.2, Sodium 136, EGFR 52->60  Recommendations:  No changes.  Encouraged to call for fluid symptoms.  Follow-up plan: ICM clinic phone appointment on 06/02/2017.    Copy of ICM check sent to Dr. [name].   3 month ICM trend: 05/01/2017    1 Year ICM trend:       [name], RN 05/01/2017 5:19 PM

## 2017-06-02 ENCOUNTER — Ambulatory Visit (INDEPENDENT_AMBULATORY_CARE_PROVIDER_SITE_OTHER): Payer: Medicare Other

## 2017-06-02 DIAGNOSIS — Z95810 Presence of automatic (implantable) cardiac defibrillator: Secondary | ICD-10-CM | POA: Diagnosis not present

## 2017-06-02 DIAGNOSIS — I5042 Chronic combined systolic (congestive) and diastolic (congestive) heart failure: Secondary | ICD-10-CM

## 2017-06-03 NOTE — Progress Notes (Signed)
EPIC Encounter for ICM Monitoring  Patient Name: Melissa Paul is a 72 y.o. female Date: 06/03/2017 Primary Care Physican: Deloria Lair., MD Primary Cardiologist:McDowell Electrophysiologist: Allred Dry Weight:193lbs (base weight 182-184 lbs) Bi-V Pacing: 94%                                                  Heart Failure questions reviewed, pt symptomatic with weight gain of 8 lbs in 2 days and legs and feet are very swollen. Difficult to wear shoes. She reported her legs are very painful.  She is not urinating a lot with the Furosemide.    Thoracic impedance normal. Impedance was decreased suggesting fluid accumulation from from 05/03/2017 to 05/08/2017 and 05/12/2017 to 05/17/2017.  Prescribed dosage: Furosemide 80 mgtwice a day.  Labs: 02/27/2017 Creatinine 0.95, BUN 10, Potassium 3.8, Sodium 138, EGFR 58->60 10/23/2016 Creatinine 0.88, BUN 12, Potassium 4.0, Sodium 138, EGFR > 60 07/11/2016 Creatinine 1.02, BUN 9, Potassium 4.3, Sodium 139, EGFR 54->60 01/04/2016 Creatinine 0.79, BUN 13, Potassium 3.8, Sodium 134, EGFR>60 12/04/2015 Creatinine 0.67, BUN 12, Potassium 3.7, Sodium 138, EGFR >60 06/12/2015 Creatinine 0.90, BUN 11, Potassium 4.1, Sodium 140, EGFR >60  10/31/2016Creatinine 1.05, BUN 13, Potassium 4.2, Sodium 136, EGFR 52->60  Recommendations:  Advised to call Dr McDowell's office tomorrow morning to schedule an appointment and copy of report sent to him for review.   Message sent to [facility] triage to make aware patient will be calling for an appointment.   Follow-up plan: ICM clinic phone appointment on 07/03/2017.    Copy of ICM check sent to Dr. [name] and Dr Domenic Polite.   3 month ICM trend: 06/02/2017    1 Year ICM trend:       [name], RN 06/03/2017 12:10 PM

## 2017-06-04 ENCOUNTER — Telehealth: Payer: Self-pay

## 2017-06-04 ENCOUNTER — Telehealth: Payer: Self-pay | Admitting: *Deleted

## 2017-06-04 ENCOUNTER — Ambulatory Visit: Payer: Medicare Other | Admitting: Physician Assistant

## 2017-06-04 NOTE — Telephone Encounter (Signed)
-----   Message from [name], RN sent at 06/03/2017  5:25 PM EDT ----- Regarding: Fluid symptoms I spoke with Dr McDowell's patient late evening 5/14.  She is having severe pain and swelling of legs, feet and weight gain of 8 lbs in last 2 days.  I advised her to call the office on 5/15 for an appointment.  She does not think Furosemide is working since she is not urinating a lot.  I will be out of the office 5/15 so just wanted someone to be aware reason for her call.  I copied Dr Domenic Polite on my remote monitoring note as well but it currently is not showing fluid accumulation in the last week.   Thanks, Sharman Cheek, RN ICM device clinic

## 2017-06-04 NOTE — Telephone Encounter (Signed)
Called pt to notify her of an appointment with Ermalinda Barrios, PA for 5/16. No answer, left message for pt to return call.

## 2017-06-04 NOTE — Telephone Encounter (Signed)
Spoke with pt who states that yesterdays wt was 194 lbs her legs were swollen and painful. She reports that today she is better. Her wt is 187 lbs and her legs are not as swollen. Pt states that she is able to wear her shoes today and walk around outside. Patient reports consuming 70 oz of water daily. When given appt with APP today at 2:30 pm she stated that she could not make it d/t to car problems. Patient states that she would go to the ED if she becomes worse.

## 2017-07-03 ENCOUNTER — Telehealth: Payer: Self-pay | Admitting: Cardiology

## 2017-07-03 ENCOUNTER — Encounter: Payer: Self-pay | Admitting: Cardiology

## 2017-07-03 ENCOUNTER — Ambulatory Visit (INDEPENDENT_AMBULATORY_CARE_PROVIDER_SITE_OTHER): Payer: Medicare Other | Admitting: *Deleted

## 2017-07-03 DIAGNOSIS — I428 Other cardiomyopathies: Secondary | ICD-10-CM

## 2017-07-03 DIAGNOSIS — Z95810 Presence of automatic (implantable) cardiac defibrillator: Secondary | ICD-10-CM

## 2017-07-03 DIAGNOSIS — I5042 Chronic combined systolic (congestive) and diastolic (congestive) heart failure: Secondary | ICD-10-CM | POA: Diagnosis not present

## 2017-07-03 NOTE — Progress Notes (Signed)
Remote ICD transmission.   

## 2017-07-03 NOTE — Telephone Encounter (Signed)
Spoke with pt and reminded pt of remote transmission that is due today. Pt verbalized understanding.   

## 2017-07-03 NOTE — Progress Notes (Signed)
EPIC Encounter for ICM Monitoring  Patient Name: Melissa Paul is a 72 y.o. female Date: 07/03/2017 Primary Care Physican: Deloria Lair., MD Primary Cardiologist:[facility] Electrophysiologist: Allred Dry Weight:185lbs (base weight 182-184 lbs) Bi-V Pacing: 94%       Heart Failure questions reviewed, pt some leg swelling but much improved since last month.   Thoracic impedance normal.  Prescribed dosage: Furosemide 80 mgtwice a day.  Labs: 02/27/2017 Creatinine 0.95, BUN 10, Potassium 3.8, Sodium 138, EGFR 58->60 10/23/2016 Creatinine 0.88, BUN 12, Potassium 4.0, Sodium 138, EGFR > 60 07/11/2016 Creatinine 1.02, BUN 9, Potassium 4.3, Sodium 139, EGFR 54->60 01/04/2016 Creatinine 0.79, BUN 13, Potassium 3.8, Sodium 134, EGFR>60 12/04/2015 Creatinine 0.67, BUN 12, Potassium 3.7, Sodium 138, EGFR >60 06/12/2015 Creatinine 0.90, BUN 11, Potassium 4.1, Sodium 140, EGFR >60  10/31/2016Creatinine 1.05, BUN 13, Potassium 4.2, Sodium 136, EGFR 52->60  Recommendations: No changes.   Encouraged to call for fluid symptoms.  Follow-up plan: ICM clinic phone appointment on 08/04/2017.    Copy of ICM check sent to Dr. [name].   3 month ICM trend: 07/03/2017    1 Year ICM trend:       [name], RN 07/03/2017 3:20 PM

## 2017-07-22 ENCOUNTER — Encounter (INDEPENDENT_AMBULATORY_CARE_PROVIDER_SITE_OTHER): Payer: Self-pay | Admitting: *Deleted

## 2017-07-22 ENCOUNTER — Encounter (INDEPENDENT_AMBULATORY_CARE_PROVIDER_SITE_OTHER): Payer: Self-pay | Admitting: Internal Medicine

## 2017-07-22 ENCOUNTER — Ambulatory Visit (INDEPENDENT_AMBULATORY_CARE_PROVIDER_SITE_OTHER): Payer: Medicare Other | Admitting: Internal Medicine

## 2017-07-22 VITALS — BP 124/78 | HR 76 | Temp 98.1°F | Ht 63.0 in | Wt 189.5 lb

## 2017-07-22 DIAGNOSIS — R1033 Periumbilical pain: Secondary | ICD-10-CM

## 2017-07-22 DIAGNOSIS — K219 Gastro-esophageal reflux disease without esophagitis: Secondary | ICD-10-CM

## 2017-07-22 DIAGNOSIS — K5900 Constipation, unspecified: Secondary | ICD-10-CM

## 2017-07-22 LAB — CREATININE, SERUM: Creat: 1.03 mg/dL — ABNORMAL HIGH (ref 0.60–0.93)

## 2017-07-22 MED ORDER — LINACLOTIDE 72 MCG PO CAPS: 72.0000 ug | ORAL_CAPSULE | Freq: Every day | ORAL | 0 refills | Status: DC

## 2017-07-22 MED ORDER — PANTOPRAZOLE SODIUM 40 MG PO TBEC: 40.0000 mg | DELAYED_RELEASE_TABLET | Freq: Every day | ORAL | 5 refills | Status: DC

## 2017-07-22 NOTE — Patient Instructions (Signed)
Physician will call with results of abdominal pelvic CT when completed.

## 2017-07-22 NOTE — Progress Notes (Addendum)
Reason for consultation;  Abdominal pain constipation and GERD symptoms.  History of present illness.:  Patient is 72 year old Caucasian female who is here for scheduled visit accompanied by her daughter Terrence Dupont.  She has been referred through courtesy of Ms. Delight Ovens, FNP of [facility] family medicine of [facility] She has multiple complaints.  She complains of mid abdominal pain which started about 2 years ago but it has been intense and more or less constant over the last 2 months.  Intensity varies.  She gets relief when she rests and pain is worse after meals.  She describes this pain to be burning and stinging pain.  It radiates to worse the right upper quadrant and epigastric region.  She reports rubbing this area seem to provide some relief.  This pain is associated with frequent nausea and occasional vomiting.  She denies hematemesis or melena she does not have good appetite.  She may have lost few pounds this year.  She states lately her weight has ranged between 182 and 189 pounds.  She has chronic constipation.  She states MiraLAX has not helped.  She can go 1 week without a bowel movement.  She also reports bright red blood per rectum every time her bowels move.  She feels it is moderate amount of blood but always fresh.  She feels she may have hemorrhoids.  Last colonoscopy was over 20 years ago.  She also complains of daily heartburn despite taking ranitidine and she burps all the time. She also complains of chronic leg pain which is worse on walking.  She says she has been checked for peripheral vascular disease and Doppler studies were negative.   She does not recall having had any chest for abdominal pain. I did review her imaging records in epic and did not see any tests for pain.  She does not take OTC NSAIDs.  She is widowed.  She lost her husband and 2016.  She has 2 daughters and 1 son.  Her daughter has coronary artery disease and hypertension.  She quit cigarette smoking 20  years ago.  She smoked no more than 7 or 8 cigarettes/day.  She has never drank alcohol.  She stayed at home taking care of children.  Father died of stomach cancer at age 62.  Mother lived to be 78 years old.  She had 11 siblings.  2 brothers and one sister are living.  Her sister is in her 21s and has dementia.     Current Medications: Outpatient Encounter Medications as of 07/22/2017  Medication Sig  . aspirin EC 81 MG tablet Take 81 mg by mouth daily.  . carvedilol (COREG) 12.5 MG tablet TAKE ONE TABLET BY MOUTH TWICE DAILY  . cetirizine (ZYRTEC) 10 MG tablet Take 10 mg by mouth daily as needed for allergies or rhinitis.   Marland Kitchen diazepam (VALIUM) 5 MG tablet Take 5 mg by mouth 3 (three) times daily.   . furosemide (LASIX) 80 MG tablet Take 80 mg by mouth.  . levothyroxine (SYNTHROID, LEVOTHROID) 75 MCG tablet Take 37.5 mcg by mouth daily.   . metFORMIN (GLUCOPHAGE) 500 MG tablet Take 500 mg by mouth 2 (two) times daily.  . ranitidine (ZANTAC) 150 MG capsule Take 150 mg by mouth daily as needed (for heartburn/indigestion).   Marland Kitchen spironolactone (ALDACTONE) 25 MG tablet Take 25 mg by mouth daily.   . furosemide (LASIX) 80 MG tablet Take 1 tablet (80 mg total) by mouth 2 (two) times daily.  . [DISCONTINUED]  methocarbamol (ROBAXIN) 500 MG tablet Take 1 tablet (500 mg total) by mouth every 8 (eight) hours as needed for muscle spasms. (Patient not taking: Reported on 07/22/2017)  . [DISCONTINUED] traMADol (ULTRAM) 50 MG tablet Take 1 tablet (50 mg total) by mouth every 6 (six) hours as needed. (Patient not taking: Reported on 07/22/2017)   No facility-administered encounter medications on file as of 07/22/2017.    Past Medical History:  Diagnosis Date  . Chronic systolic heart failure ([place])   . Glucose intolerance (pre-diabetes)   . Hyperlipemia   . Hypothyroidism   . LBBB (left bundle branch block)   . Mitral regurgitation    Mild to Moderate  . Nonischemic cardiomyopathy ([place])    LVEF 35-40% by  echo 8/13  . Paroxysmal atrial fibrillation (HCC)    Brief episodes by device interrogation  . UTI (urinary tract infection)    Recurrent  . Ventricular tachycardia ([place])    ICD therapy for VT   Past Surgical History:  Procedure Laterality Date  . APPENDECTOMY    . BIV ICD GENERATOR CHANGEOUT N/A 07/11/2016   Procedure: BiV ICD Generator Changeout;  Surgeon: Thompson Grayer, MD;  Location: [facility];  Service: Cardiovascular;  Laterality: N/A;  . CARDIAC DEFIBRILLATOR PLACEMENT  09/2009   St. Jude BiV ICD by Dr. Sandria Manly class III  . CHOLECYSTECTOMY    . has one ovary left    . KNEE ARTHROSCOPY     Left  . LEFT AND RIGHT HEART CATHETERIZATION WITH CORONARY ANGIOGRAM N/A 04/12/2014   Procedure: LEFT AND RIGHT HEART CATHETERIZATION WITH CORONARY ANGIOGRAM;  Surgeon: Leonie Man, MD;  Location: [facility];  Service: Cardiovascular;  Laterality: N/A;  . Left breast biopsy x 2     no maliganancy  . TOTAL ABDOMINAL HYSTERECTOMY     (R) ovary removed   Allergies:  Allergies  Allergen Reactions  . Bactrim Rash  . Cephalexin Rash  . Chlorhexidine Gluconate     Hives, itching after using wipes with last procedure  . Clindamycin Rash       . Contrast Media [Iodinated Diagnostic Agents] Rash  . Doxycycline Rash  . Latex Rash       . Levofloxacin Rash       . Nitrofurantoin Monohyd Macro Rash       . Penicillins Swelling and Rash    Has patient had a PCN reaction causing immediate rash, facial/tongue/throat swelling, SOB or lightheadedness with hypotension: Yes Has patient had a PCN reaction causing severe rash involving mucus membranes or skin necrosis: Yes Has patient had a PCN reaction that required hospitalization: No Has patient had a PCN reaction occurring within the last 10 years: No Rash/swelled tongue If all of the above answers are "NO", then may proceed with Cephalosporin use.      Physical examination: Blood pressure 124/78, pulse 76, temperature  98.1 F (36.7 C), height 5\' 3"  (1.6 m), weight 189 lb 8 oz (86 kg). Patient is alert and in no acute distress. Conjunctiva is pink. Sclera is nonicteric Oropharyngeal mucosa is normal. No neck masses or thyromegaly noted. Cardiac exam with regular rhythm normal S1 and S2. No murmur or gallop noted. Lungs are clear to auscultation. Abdomen is full with lower midline scar.  Bowel sounds are normal.  No bruit noted.  On palpation she does not have any nodule at the umbilicus.  She has small skin tag deep inside umbilicus.  No hernia noted at the umbilicus.  She has  mild to moderate tenderness in periumbilical region as well as mid epigastric region.  She has mild tenderness in other quadrants.  No organomegaly or masses.  Rectal examination reveals prominent external hemorrhoids With a small clot over 1 of these.  No active bleeding noted.  Digital exam was limited as she was uncomfortable. No LE edema or clubbing noted.  She has lipoma involving left forearm on the dorsal aspect.  Labs/studies Results: Lab data from 02/27/2017  WBC 6.8, H&H 13.9 and 41.5 and platelet count 285K.  Serum sodium 138, potassium 3.8, chloride 98, CO2 28 Glucose 113 BUN 10 and creatinine 0.95 Serum calcium 9.3.  Bilirubin 1.0, AP 70, AST 20, ALT 13, total protein 7.4 and albumin 4.4.  Serum lipase 24  Assessment:  #1.  Abdominal pain.  Patient presents with acute on chronic abdominal pain which is predominantly mid abdominal radiating to epigastrium and right upper quadrant.  She does not have palpable mass in the region of the umbilicus or abdominal bruit.  But given history of nonischemic cardiomyopathy and low ejection fraction she may have abdominal ischemia due to low flow state.  She could also have atherosclerotic disease.  Peptic ulcer disease is possible but less likely.  #2.  GERD.  Symptoms are not well controlled with diet and ranitidine.  #3.  Chronic constipation.  She has not responded to  polyethylene glycol.  #4.  Chronic hematochezia appears to be secondary to hemorrhoids.  Other etiologies need to be ruled out.  She is long overdue for CRC screening.   Recommendations:  .Proceed with abdominal pelvic CT with contrast. Will recheck serum creatinine before she receives IV contrast.  Since she experienced skin rash with IV contrast she will need to be premedicated per radiology protocol.  feel unenhanced study would be suboptimal. Discontinue ranitidine. Begin pantoprazole 40 mg p.o. every morning. Linzess 72 mcg p.o. every morning.  Patient was given 16 days supply.  If this medication works will call in prescription.   Further recommendations to be made after CT completed. Office visit in 2 months.

## 2017-07-25 LAB — CUP PACEART REMOTE DEVICE CHECK
Battery Remaining Longevity: 66 mo
Battery Remaining Percentage: 84 %
Battery Voltage: 2.99 V
Brady Statistic AP VP Percent: 91 %
Brady Statistic AP VS Percent: 2.6 %
Brady Statistic AS VP Percent: 1.8 %
Brady Statistic AS VS Percent: 1 %
Brady Statistic RA Percent Paced: 89 %
Date Time Interrogation Session: 20190613181933
HighPow Impedance: 83 Ohm
HighPow Impedance: 83 Ohm
Implantable Lead Implant Date: 20110912
Implantable Lead Implant Date: 20110912
Implantable Lead Implant Date: 20120710
Implantable Lead Location: 753858
Implantable Lead Location: 753859
Implantable Lead Location: 753860
Implantable Lead Model: 7122
Implantable Pulse Generator Implant Date: 20180621
Lead Channel Impedance Value: 380 Ohm
Lead Channel Impedance Value: 480 Ohm
Lead Channel Impedance Value: 600 Ohm
Lead Channel Pacing Threshold Amplitude: 0.625 V
Lead Channel Pacing Threshold Amplitude: 0.75 V
Lead Channel Pacing Threshold Amplitude: 0.75 V
Lead Channel Pacing Threshold Pulse Width: 0.5 ms
Lead Channel Pacing Threshold Pulse Width: 0.5 ms
Lead Channel Pacing Threshold Pulse Width: 0.5 ms
Lead Channel Sensing Intrinsic Amplitude: 12 mV
Lead Channel Sensing Intrinsic Amplitude: 3.1 mV
Lead Channel Setting Pacing Amplitude: 2 V
Lead Channel Setting Pacing Amplitude: 2 V
Lead Channel Setting Pacing Amplitude: 2 V
Lead Channel Setting Pacing Pulse Width: 0.5 ms
Lead Channel Setting Pacing Pulse Width: 0.5 ms
Lead Channel Setting Sensing Sensitivity: 0.5 mV
Pulse Gen Serial Number: 9761374

## 2017-08-04 ENCOUNTER — Telehealth: Payer: Self-pay

## 2017-08-04 ENCOUNTER — Ambulatory Visit (INDEPENDENT_AMBULATORY_CARE_PROVIDER_SITE_OTHER): Payer: Medicare Other

## 2017-08-04 DIAGNOSIS — I5042 Chronic combined systolic (congestive) and diastolic (congestive) heart failure: Secondary | ICD-10-CM | POA: Diagnosis not present

## 2017-08-04 DIAGNOSIS — Z95810 Presence of automatic (implantable) cardiac defibrillator: Secondary | ICD-10-CM

## 2017-08-04 NOTE — Telephone Encounter (Signed)
I spoke with the patient about this.

## 2017-08-05 NOTE — Progress Notes (Signed)
EPIC Encounter for ICM Monitoring  Patient Name: Melissa Paul is a 72 y.o. female Date: 08/05/2017 Primary Care Physican: Eustaquio Maize, MD Primary Cardiologist:[facility] Electrophysiologist: Allred Dry Weight:188lbs (base weight 182-184lbs) Bi-V Pacing: 94%       Heart Failure questions reviewed, pt asymptomatic.   Thoracic impedance normal.  Prescribed dosage: Furosemide 80 mgtwice a day.  Labs: 02/27/2017 Creatinine 0.95, BUN 10, Potassium 3.8, Sodium 138, EGFR 58->60 10/23/2016 Creatinine 0.88, BUN 12, Potassium 4.0, Sodium 138, EGFR > 60 07/11/2016 Creatinine 1.02, BUN 9, Potassium 4.3, Sodium 139, EGFR 54->60 01/04/2016 Creatinine 0.79, BUN 13, Potassium 3.8, Sodium 134, EGFR>60 12/04/2015 Creatinine 0.67, BUN 12, Potassium 3.7, Sodium 138, EGFR >60 06/12/2015 Creatinine 0.90, BUN 11, Potassium 4.1, Sodium 140, EGFR >60  10/31/2016Creatinine 1.05, BUN 13, Potassium 4.2, Sodium 136, EGFR 52->60   Recommendations: No changes.   Encouraged to call for fluid symptoms.  Follow-up plan: ICM clinic phone appointment on 09/04/2017.   Office appointment scheduled 09/05/2017 with Dr. Domenic Polite.    Copy of ICM check sent to Dr. [name].   3 month ICM trend: 08/04/2017    1 Year ICM trend:       [name], RN 08/05/2017 5:41 PM

## 2017-08-07 ENCOUNTER — Ambulatory Visit (HOSPITAL_COMMUNITY): Payer: Medicare Other

## 2017-08-11 ENCOUNTER — Encounter: Payer: Self-pay | Admitting: Pediatrics

## 2017-08-11 ENCOUNTER — Ambulatory Visit (INDEPENDENT_AMBULATORY_CARE_PROVIDER_SITE_OTHER): Payer: Medicare Other | Admitting: Pediatrics

## 2017-08-11 VITALS — BP 100/68 | HR 69 | Temp 97.3°F | Ht 63.0 in | Wt 191.8 lb

## 2017-08-11 DIAGNOSIS — R42 Dizziness and giddiness: Secondary | ICD-10-CM | POA: Diagnosis not present

## 2017-08-11 DIAGNOSIS — E119 Type 2 diabetes mellitus without complications: Secondary | ICD-10-CM

## 2017-08-11 DIAGNOSIS — I428 Other cardiomyopathies: Secondary | ICD-10-CM | POA: Diagnosis not present

## 2017-08-11 DIAGNOSIS — I480 Paroxysmal atrial fibrillation: Secondary | ICD-10-CM | POA: Diagnosis not present

## 2017-08-11 DIAGNOSIS — E039 Hypothyroidism, unspecified: Secondary | ICD-10-CM

## 2017-08-11 DIAGNOSIS — E1165 Type 2 diabetes mellitus with hyperglycemia: Secondary | ICD-10-CM | POA: Insufficient documentation

## 2017-08-11 LAB — BAYER DCA HB A1C WAIVED: HB A1C (BAYER DCA - WAIVED): 5.7 % (ref <7.0)

## 2017-08-11 MED ORDER — DIAZEPAM 5 MG PO TABS: 5.0000 mg | ORAL_TABLET | Freq: Every day | ORAL | 1 refills | Status: DC | PRN

## 2017-08-11 NOTE — Progress Notes (Signed)
Subjective:   Patient ID: Melissa Paul, female    DOB: Aug 01, 1945, 72 y.o.   MRN: [id number] CC: New Patient (Initial Visit)  HPI: Melissa Paul is a 72 y.o. female   DM2: Taking metformin.  She does not letter this recent A1c was.  Vertigo: She has had it for years.  She takes 5 mg diazepam in the morning with improvement in her symptoms.  Usually she is very steady on her feet she takes the diazepam.  She does not think it makes her sleepy or off balance.  She has not had the diazepam for the last 2 weeks, she has had vertigo symptoms daily since then.  Systolic CHF: Following with cardiology.  No swelling in lower extremities.  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  One on her arm has been there for over 20 years, minimal change.  Paroxysmal atrial fibrillation: Brief episodes seen on device interrogation.  Patient with desires not to be on anticoagulation.  She is taking an aspirin daily.  She has significant problem with hemorrhoids bleeding.  She is worried that that would make it worse.  She says she has discussed with her cardiologist is aware of the stroke risk.    Past Medical History:  Diagnosis Date  . Chronic systolic heart failure ([place])   . Glucose intolerance (pre-diabetes)   . Hyperlipemia   . Hypothyroidism   . LBBB (left bundle branch block)   . Mitral regurgitation    Mild to Moderate  . Nonischemic cardiomyopathy ([place])    LVEF 35-40% by echo 8/13  . Paroxysmal atrial fibrillation (HCC)    Brief episodes by device interrogation  . UTI (urinary tract infection)    Recurrent  . Ventricular tachycardia ([place])    ICD therapy for VT   Family History  Problem Relation Age of Onset  . CVA Mother        multiple  . Heart Problems Mother        "weak heart"  . Heart disease Maternal Grandmother   . Heart disease Maternal Grandfather   . Diabetes Neg Hx   . Hypertension Neg Hx   . Coronary artery disease Neg  Hx    Social History   Socioeconomic History  . Marital status: Widowed    Spouse name: Not on file  . Number of children: Not on file  . Years of education: Not on file  . Highest education level: Not on file  Occupational History  . Not on file  Social Needs  . Financial resource strain: Not on file  . Food insecurity:    Worry: Not on file    Inability: Not on file  . Transportation needs:    Medical: Not on file    Non-medical: Not on file  Tobacco Use  . Smoking status: Former Smoker    Packs/day: 0.30    Years: 20.00    Pack years: 6.00    Types: Cigarettes    Start date: 08/03/1949    Last attempt to quit: 01/22/1996    Years since quitting: 21.5  . Smokeless tobacco: Never Used  Substance and Sexual Activity  . Alcohol use: No    Alcohol/week: 0.0 oz  . Drug use: No  . Sexual activity: Not on file  Lifestyle  . Physical activity:    Days per week: Not on file    Minutes per session: Not on file  . Stress: Not on  file  Relationships  . Social connections:    Talks on phone: Not on file    Gets together: Not on file    Attends religious service: Not on file    Active member of club or organization: Not on file    Attends meetings of clubs or organizations: Not on file    Relationship status: Not on file  Other Topics Concern  . Not on file  Social History Narrative  . Not on file   ROS: All systems negative other than what is in HPI  Objective:    BP 100/68   Pulse 69   Temp (!) 97.3 F (36.3 C) (Oral)   Ht 5' 3" (1.6 m)   Wt 191 lb 12.8 oz (87 kg)   BMI 33.98 kg/m   Wt Readings from Last 3 Encounters:  08/11/17 191 lb 12.8 oz (87 kg)  07/22/17 189 lb 8 oz (86 kg)  03/05/17 178 lb (80.7 kg)    Gen: NAD, alert, cooperative with exam, NCAT EYES: EOMI, no conjunctival injection, or no icterus ENT:  TMs pearly gray b/l, OP without erythema LYMPH: no cervical LAD CV: NRRR, normal S1/S2, no murmur, distal pulses 2+ b/l Resp: CTABL, no wheezes,  normal WOB Abd: +BS, soft, NTND. 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  Similar proximal to right knee.  No redness, induration, fluctuance.  Assessment & Plan:  Melissa Paul was seen today for new patient (initial visit).  Diagnoses and all orders for this visit:  Vertigo Stable, continue below -     diazepam (VALIUM) 5 MG tablet; Take 1 tablet (5 mg total) by mouth daily as needed for anxiety.  Nonischemic cardiomyopathy ([place]) Follows with cardiology.  Has ICD.  On beta-blocker, aspirin.  Paroxysmal atrial fibrillation [facility]) Patient prefers to remain off of anticoagulation.  Diabetes mellitus without complication (HCC) Recent morning blood sugar is 150s.  Usually lower than that morning. -     CMP14+EGFR -     Lipid panel -     Bayer DCA Hb A1c Waived  Hypothyroidism, unspecified type Follow-up TSH, continue replacement medicine. -     TSH  Continue to follow fatty tumors.  Consider imaging if any change in symptoms.  Follow up plan: 3 months, sooner if needed. Assunta Found, MD [place]

## 2017-08-12 LAB — CMP14+EGFR
ALT: 9 IU/L (ref 0–32)
AST: 12 IU/L (ref 0–40)
Albumin/Globulin Ratio: 2 (ref 1.2–2.2)
Albumin: 4.5 g/dL (ref 3.5–4.8)
Alkaline Phosphatase: 83 IU/L (ref 39–117)
BUN/Creatinine Ratio: 12 (ref 12–28)
BUN: 12 mg/dL (ref 8–27)
Bilirubin Total: 0.6 mg/dL (ref 0.0–1.2)
CO2: 28 mmol/L (ref 20–29)
Calcium: 9.5 mg/dL (ref 8.7–10.3)
Chloride: 97 mmol/L (ref 96–106)
Creatinine, Ser: 1.01 mg/dL — ABNORMAL HIGH (ref 0.57–1.00)
GFR calc Af Amer: 63 mL/min/{1.73_m2} (ref >59)
GFR calc non Af Amer: 55 mL/min/{1.73_m2} — ABNORMAL LOW (ref >59)
Globulin, Total: 2.3 g/dL (ref 1.5–4.5)
Glucose: 98 mg/dL (ref 65–99)
Potassium: 4.2 mmol/L (ref 3.5–5.2)
Sodium: 140 mmol/L (ref 134–144)
Total Protein: 6.8 g/dL (ref 6.0–8.5)

## 2017-08-12 LAB — LIPID PANEL
Chol/HDL Ratio: 3.5 ratio (ref 0.0–4.4)
Cholesterol, Total: 177 mg/dL (ref 100–199)
HDL: 50 mg/dL (ref >39)
LDL Calculated: 105 mg/dL — ABNORMAL HIGH (ref 0–99)
Triglycerides: 108 mg/dL (ref 0–149)
VLDL Cholesterol Cal: 22 mg/dL (ref 5–40)

## 2017-08-12 LAB — TSH: TSH: 6.76 u[IU]/mL — ABNORMAL HIGH (ref 0.450–4.500)

## 2017-08-18 ENCOUNTER — Other Ambulatory Visit: Payer: Self-pay | Admitting: Pediatrics

## 2017-08-18 MED ORDER — LEVOTHYROXINE SODIUM 50 MCG PO TABS: 50.0000 ug | ORAL_TABLET | Freq: Every day | ORAL | 1 refills | Status: DC

## 2017-08-21 ENCOUNTER — Ambulatory Visit (HOSPITAL_COMMUNITY)
Admission: RE | Admit: 2017-08-21 | Discharge: 2017-08-21 | Disposition: A | Discharge status: Home / Self Care | Payer: Medicare Other | Source: Ambulatory Visit | Attending: Internal Medicine | Admitting: Internal Medicine

## 2017-08-21 ENCOUNTER — Telehealth: Payer: Self-pay | Admitting: Pediatrics

## 2017-08-21 ENCOUNTER — Other Ambulatory Visit: Payer: Self-pay | Admitting: Pediatrics

## 2017-08-21 DIAGNOSIS — Z889 Allergy status to unspecified drugs, medicaments and biological substances status: Secondary | ICD-10-CM

## 2017-08-21 DIAGNOSIS — N8110 Cystocele, unspecified: Secondary | ICD-10-CM | POA: Diagnosis not present

## 2017-08-21 DIAGNOSIS — K439 Ventral hernia without obstruction or gangrene: Secondary | ICD-10-CM | POA: Diagnosis not present

## 2017-08-21 DIAGNOSIS — K5730 Diverticulosis of large intestine without perforation or abscess without bleeding: Secondary | ICD-10-CM | POA: Insufficient documentation

## 2017-08-21 DIAGNOSIS — N3090 Cystitis, unspecified without hematuria: Secondary | ICD-10-CM

## 2017-08-21 DIAGNOSIS — R1033 Periumbilical pain: Secondary | ICD-10-CM | POA: Diagnosis not present

## 2017-08-21 MED ORDER — CEFUROXIME AXETIL 250 MG PO TABS: 250.0000 mg | ORAL_TABLET | Freq: Two times a day (BID) | ORAL | 0 refills | Status: DC

## 2017-08-21 MED ORDER — IOPAMIDOL (ISOVUE-300) INJECTION 61%
100.0000 mL | Freq: Once | INTRAVENOUS | Status: AC | PRN
  Administered 2017-08-21: 100 mL via INTRAVENOUS

## 2017-08-21 NOTE — Telephone Encounter (Signed)
Pt's daughter states pt was supposed to have had a urine culture done but I don't see it in her chart. Please advise?

## 2017-08-21 NOTE — Progress Notes (Signed)
Patient has never had symptoms with cefuroxime.  She says that she tolerates that medicine well.  Continues to have dysuria and burning.  She will come by and leave a urine sample for culture.  We will send in cefuroxime.  Patient with multiple drug allergies listed to all most classes of antibiotics, including cephalosporins though she has tolerated cefuroxime in the past without symptoms.  Will refer to allergy to help determine options for antibiotics.

## 2017-08-25 NOTE — Telephone Encounter (Signed)
Called and discussed w/ pt 8/1

## 2017-08-27 ENCOUNTER — Other Ambulatory Visit (INDEPENDENT_AMBULATORY_CARE_PROVIDER_SITE_OTHER): Payer: Self-pay | Admitting: *Deleted

## 2017-08-27 DIAGNOSIS — N281 Cyst of kidney, acquired: Secondary | ICD-10-CM

## 2017-09-01 ENCOUNTER — Ambulatory Visit (HOSPITAL_COMMUNITY)
Admission: RE | Admit: 2017-09-01 | Discharge: 2017-09-01 | Disposition: A | Discharge status: Home / Self Care | Payer: Medicare Other | Source: Ambulatory Visit | Attending: Internal Medicine | Admitting: Internal Medicine

## 2017-09-01 DIAGNOSIS — N281 Cyst of kidney, acquired: Secondary | ICD-10-CM | POA: Diagnosis present

## 2017-09-04 ENCOUNTER — Ambulatory Visit (INDEPENDENT_AMBULATORY_CARE_PROVIDER_SITE_OTHER): Payer: Medicare Other

## 2017-09-04 DIAGNOSIS — I5042 Chronic combined systolic (congestive) and diastolic (congestive) heart failure: Secondary | ICD-10-CM | POA: Diagnosis not present

## 2017-09-04 DIAGNOSIS — Z95810 Presence of automatic (implantable) cardiac defibrillator: Secondary | ICD-10-CM

## 2017-09-04 NOTE — Progress Notes (Signed)
Cardiology Office Note  Date: 09/05/2017   ID: SIMONE RODENBECK, DOB [birth year], MRN [id number]  PCP: Eustaquio Maize, MD  Primary Cardiologist: Rozann Lesches, MD   Chief Complaint  Patient presents with  . Cardiomyopathy    History of Present Illness: Melissa Paul is a 72 y.o. female last seen in February.  She is here for a routine follow-up visit today.  Within the last few weeks she has had more noticeable leg swelling and feeling of increased abdominal girth with shortness of breath.  By her scales at home her weight is up about 6 pounds.  She has taken 2 extra doses of Lasix this week.  She continues to follow in the device clinic with Dr. [name], St. Jude biventricular ICD in place.  Recent interrogation showed normal thoracic impedance, but recent evidence of fluid accumulation.  Current cardiac regimen includes Coreg, Lasix, and Aldactone.  We have not had her on ARB with relatively low blood pressure at baseline, systolics tend to run in the 90-100 range.  Past Medical History:  Diagnosis Date  . Chronic systolic heart failure ([place])   . Glucose intolerance (pre-diabetes)   . Hyperlipemia   . Hypothyroidism   . LBBB (left bundle branch block)   . Mitral regurgitation    Mild to Moderate  . Nonischemic cardiomyopathy ([place])    LVEF 35-40% by echo 8/13  . Paroxysmal atrial fibrillation (HCC)    Brief episodes by device interrogation  . UTI (urinary tract infection)    Recurrent  . Ventricular tachycardia ([place])    ICD therapy for VT    Past Surgical History:  Procedure Laterality Date  . APPENDECTOMY    . BIV ICD GENERATOR CHANGEOUT N/A 07/11/2016   Procedure: BiV ICD Generator Changeout;  Surgeon: Thompson Grayer, MD;  Location: [facility];  Service: Cardiovascular;  Laterality: N/A;  . CARDIAC DEFIBRILLATOR PLACEMENT  09/2009   St. Jude BiV ICD by Dr. Sandria Manly class III  . CHOLECYSTECTOMY    . has one ovary left    . KNEE ARTHROSCOPY     Left    . LEFT AND RIGHT HEART CATHETERIZATION WITH CORONARY ANGIOGRAM N/A 04/12/2014   Procedure: LEFT AND RIGHT HEART CATHETERIZATION WITH CORONARY ANGIOGRAM;  Surgeon: Leonie Man, MD;  [facility]: [facility] CATH LAB;  Service: Cardiovascular;  Laterality: N/A;  . Left breast biopsy x 2     no maliganancy  . TOTAL ABDOMINAL HYSTERECTOMY     (R) ovary removed    Current Outpatient Medications  Medication Sig Dispense Refill  . aspirin EC 81 MG tablet Take 81 mg by mouth daily.    . carvedilol (COREG) 12.5 MG tablet TAKE ONE TABLET BY MOUTH TWICE DAILY 180 tablet 3  . cetirizine (ZYRTEC) 10 MG tablet Take 10 mg by mouth daily as needed for allergies or rhinitis.     Marland Kitchen estradiol (ESTRACE) 0.1 MG/GM vaginal cream Place 1 Applicatorful vaginally at bedtime.    . furosemide (LASIX) 80 MG tablet Take 80 mg by mouth.    . levothyroxine (SYNTHROID, LEVOTHROID) 50 MCG tablet Take 1 tablet (50 mcg total) by mouth daily. 90 tablet 1  . metFORMIN (GLUCOPHAGE) 500 MG tablet Take 500 mg by mouth 2 (two) times daily.    . pantoprazole (PROTONIX) 40 MG tablet Take 1 tablet (40 mg total) by mouth daily before breakfast. 30 tablet 5  . spironolactone (ALDACTONE) 25 MG tablet Take 25 mg by mouth daily.  No current facility-administered medications for this visit.    Allergies:  Bactrim; Cephalexin; Chlorhexidine gluconate; Clindamycin; Contrast media [iodinated diagnostic agents]; Doxycycline; Latex; Levofloxacin; Nitrofurantoin monohyd macro; and Penicillins   Social History: The patient  reports that she quit smoking about 21 years ago. Her smoking use included cigarettes. She started smoking about 68 years ago. She has a 6.00 pack-year smoking history. She has never used smokeless tobacco. She reports that she does not drink alcohol or use drugs.   ROS:  Please see the history of present illness. Otherwise, complete review of systems is positive for none.  All other systems are reviewed and negative.    Physical Exam: VS:  BP (!) 100/58   Pulse 70   Ht 5\' 3"  (1.6 m)   Wt 192 lb (87.1 kg)   SpO2 98%   BMI 34.01 kg/m , BMI Body mass index is 34.01 kg/m.  Wt Readings from Last 3 Encounters:  09/05/17 192 lb (87.1 kg)  08/11/17 191 lb 12.8 oz (87 kg)  07/22/17 189 lb 8 oz (86 kg)    General: Patient appears comfortable at rest. HEENT: Conjunctiva and lids normal, oropharynx clear. Neck: Supple, no elevated JVP or carotid bruits, no thyromegaly. Lungs: Clear to auscultation, nonlabored breathing at rest. Cardiac: Regular rate and rhythm, no S3 or significant systolic murmur. Abdomen: Soft, nontender, bowel sounds present. Extremities: Lower leg edema and increased adipose tissue, distal pulses 2+.  ECG: I personally reviewed the tracing from 02/27/2017 which showed dual-chamber pacing.  Recent Labwork: 02/27/2017: Hemoglobin 13.9; Platelets 285 08/11/2017: ALT 9; AST 12; BUN 12; Creatinine, Ser 1.01; Potassium 4.2; Sodium 140; TSH 6.760     Component Value Date/Time   CHOL 177 08/11/2017 1019   TRIG 108 08/11/2017 1019   HDL 50 08/11/2017 1019   CHOLHDL 3.5 08/11/2017 1019   CHOLHDL 3.3 04/11/2007 0600   VLDL 10 04/11/2007 0600   LDLCALC 105 (H) 08/11/2017 1019    Other Studies Reviewed Today:  Echocardiogram 10/23/2016: Study Conclusions  - Left ventricle: The cavity size was mildly dilated. Wall   thickness was increased in a pattern of mild LVH. Systolic   function was mildly to moderately reduced. The estimated ejection   fraction was in the range of 40% to 45%. Diastolic function is   abnormal, indeterminant grade. Diffuse hypokinesis. - Aortic valve: Mildly calcified annulus. Trileaflet; normal   thickness leaflets. Valve area (VTI): 2.08 cm^2. Valve area   (Vmax): 1.94 cm^2. Valve area (Vmean): 1.91 cm^2. - Mitral valve: Mildly calcified annulus. Normal thickness leaflets. There was mild regurgitation. - Right ventricle: Systolic function was mildly reduced. -  Technically adequate study.  Assessment and Plan:  1.  Chronic combined heart failure with LVEF 40 to 45% by echocardiogram last year.  She has had recent fluid accumulation, plan is to continue Lasix 80 mg daily with an additional 40 mg daily for the next 2 to 3 days.  She seems to feel better when her weight is in the lower 180s.  We may need to increase standing Lasix depending on how frequently she is needing extra doses.  I reinforced low-sodium diet, continue to check daily weights.  2.  Nonischemic cardiomyopathy.  Continue medical therapy.  We will obtain a follow-up echocardiogram for reassessment.  She is on Coreg, Lasix, and Aldactone.  Not on ARB with low blood pressure at baseline.  3.  St. Jude biventricular ICD in place.  She follows with Dr. [name].  Current medicines were reviewed  with the patient today.   Orders Placed This Encounter  Procedures  . ECHOCARDIOGRAM COMPLETE    Disposition: Follow-up in 6 months, sooner if needed.  Signed, Satira Sark, MD, [facility] 09/05/2017 11:40 AM    [place] at [place], [place], [place] [address] Phone: [phone]; Fax: [phone]

## 2017-09-04 NOTE — Progress Notes (Signed)
EPIC Encounter for ICM Monitoring  Patient Name: Melissa Paul is a 72 y.o. female Date: 09/04/2017 Primary Care Physican: Eustaquio Maize, MD Primary Cardiologist:McDowell Electrophysiologist: Allred Dry Weight:184lbs (base weight 182-184lbs) Bi-V Pacing: 94%      Heart Failure questions reviewed, pt symptomatic with swelling in feet, hands, legs and abdomen.  She stated the fluid in her abdomen seemed to have resolved today but she still has swelling from knees down.   Thoracic impedance normal but was abnormal suggesting fluid accumulation from 08/22/2017 - 08/27/2017.  Prescribed dosage: Furosemide 80 mgtwice a day.  Labs: 08/06/2017 Creatinine 1.01, BUN 12, Potassium 4.2, Sodium 140, EGFR 55-63 02/27/2017 Creatinine 0.95, BUN 10, Potassium 3.8, Sodium 138, EGFR 58->60 10/23/2016 Creatinine 0.88, BUN 12, Potassium 4.0, Sodium 138, EGFR > 60 07/11/2016 Creatinine 1.02, BUN 9, Potassium 4.3, Sodium 139, EGFR 54->60 01/04/2016 Creatinine 0.79, BUN 13, Potassium 3.8, Sodium 134, EGFR>60 12/04/2015 Creatinine 0.67, BUN 12, Potassium 3.7, Sodium 138, EGFR >60 06/12/2015 Creatinine 0.90, BUN 11, Potassium 4.1, Sodium 140, EGFR >60  10/31/2016Creatinine 1.05, BUN 13, Potassium 4.2, Sodium 136, EGFR 52->60  Recommendations:  No changes.    Encouraged to call for fluid symptoms.  Follow-up plan: ICM clinic phone appointment on 10/06/2017.   Office appointment scheduled 09/05/2017 with Dr. Domenic Polite.    Copy of ICM check sent to Dr. [name] and Dr Domenic Polite.   3 month ICM trend: 09/04/2017    1 Year ICM trend:       [name], RN 09/04/2017 5:22 PM

## 2017-09-05 ENCOUNTER — Telehealth: Payer: Self-pay | Admitting: Cardiology

## 2017-09-05 ENCOUNTER — Ambulatory Visit (INDEPENDENT_AMBULATORY_CARE_PROVIDER_SITE_OTHER): Payer: Medicare Other | Admitting: Cardiology

## 2017-09-05 ENCOUNTER — Encounter: Payer: Self-pay | Admitting: Cardiology

## 2017-09-05 VITALS — BP 100/58 | HR 70 | Ht 63.0 in | Wt 192.0 lb

## 2017-09-05 DIAGNOSIS — Z95810 Presence of automatic (implantable) cardiac defibrillator: Secondary | ICD-10-CM

## 2017-09-05 DIAGNOSIS — I428 Other cardiomyopathies: Secondary | ICD-10-CM

## 2017-09-05 DIAGNOSIS — I5042 Chronic combined systolic (congestive) and diastolic (congestive) heart failure: Secondary | ICD-10-CM | POA: Diagnosis not present

## 2017-09-05 NOTE — Patient Instructions (Signed)
Medication Instructions:  Your physician recommends that you continue on your current medications as directed. Please refer to the Current Medication list given to you today.  Labwork: NONE  Testing/Procedures: Your physician has requested that you have an echocardiogram. Echocardiography is a painless test that uses sound waves to create images of your heart. It provides your doctor with information about the size and shape of your heart and how well your heart's chambers and valves are working. This procedure takes approximately one hour. There are no restrictions for this procedure.  Follow-Up: Your physician wants you to follow-up in: 6 MONTHS WITH DR. MCDOWELL. You will receive a reminder letter in the mail two months in advance. If you don't receive a letter, please call our office to schedule the follow-up appointment.  Any Other Special Instructions Will Be Listed Below (If Applicable).  If you need a refill on your cardiac medications before your next appointment, please call your pharmacy. 

## 2017-09-05 NOTE — Telephone Encounter (Signed)
Pre-cert Verification for the following procedure   Echo scheduled for 10-29-2017

## 2017-09-24 ENCOUNTER — Other Ambulatory Visit: Payer: Self-pay | Admitting: Pediatrics

## 2017-09-24 NOTE — Telephone Encounter (Signed)
What is the name of the medication? Metformin 500 mg, Spironolactone 25 mg  Have you contacted your pharmacy to request a refill? YES  Which pharmacy would you like this sent to? [facility]   Patient notified that their request is being sent to the clinical staff for review and that they should receive a call once it is complete. If they do not receive a call within 24 hours they can check with their pharmacy or our office.

## 2017-09-25 MED ORDER — SPIRONOLACTONE 25 MG PO TABS: 25.0000 mg | ORAL_TABLET | Freq: Every day | ORAL | 0 refills | Status: DC

## 2017-09-25 MED ORDER — METFORMIN HCL 500 MG PO TABS: 500.0000 mg | ORAL_TABLET | Freq: Two times a day (BID) | ORAL | 0 refills | Status: DC

## 2017-09-25 NOTE — Telephone Encounter (Signed)
Pt aware refill sent to pharmacy 

## 2017-10-06 ENCOUNTER — Ambulatory Visit (INDEPENDENT_AMBULATORY_CARE_PROVIDER_SITE_OTHER): Payer: Medicare Other | Admitting: *Deleted

## 2017-10-06 ENCOUNTER — Ambulatory Visit (INDEPENDENT_AMBULATORY_CARE_PROVIDER_SITE_OTHER): Payer: Medicare Other

## 2017-10-06 DIAGNOSIS — I428 Other cardiomyopathies: Secondary | ICD-10-CM

## 2017-10-06 DIAGNOSIS — Z95810 Presence of automatic (implantable) cardiac defibrillator: Secondary | ICD-10-CM

## 2017-10-06 DIAGNOSIS — I5042 Chronic combined systolic (congestive) and diastolic (congestive) heart failure: Secondary | ICD-10-CM | POA: Diagnosis not present

## 2017-10-06 NOTE — Progress Notes (Signed)
Remote ICD transmission.   

## 2017-10-07 NOTE — Progress Notes (Signed)
EPIC Encounter for ICM Monitoring  Patient Name: SHARALEE WITMAN is a 72 y.o. female Date: 10/07/2017 Primary Care Physican: Eustaquio Maize, MD Primary Cardiologist:McDowell Electrophysiologist: Allred Dry Weight:182lbs (base weight 182-184lbs) Bi-V Pacing: 95%              Heart Failure questions reviewed, pt asymptomatic.   Thoracic impedance normal but was abnormal suggesting fluid accumulation from 09/13/2017 - 09/24/2017.  Prescribed: Furosemide 80 mgtwice a day.  Labs: 08/06/2017 Creatinine 1.01, BUN 12, Potassium 4.2, Sodium 140, EGFR 55-63 02/27/2017 Creatinine 0.95, BUN 10, Potassium 3.8, Sodium 138, EGFR 58->60 10/23/2016 Creatinine 0.88, BUN 12, Potassium 4.0, Sodium 138, EGFR > 60 07/11/2016 Creatinine 1.02, BUN 9, Potassium 4.3, Sodium 139, EGFR 54->60 01/04/2016 Creatinine 0.79, BUN 13, Potassium 3.8, Sodium 134, EGFR>60 12/04/2015 Creatinine 0.67, BUN 12, Potassium 3.7, Sodium 138, EGFR >60 06/12/2015 Creatinine 0.90, BUN 11, Potassium 4.1, Sodium 140, EGFR >60  10/31/2016Creatinine 1.05, BUN 13, Potassium 4.2, Sodium 136, EGFR 52->60  Recommendations: No changes.   Encouraged to call for fluid symptoms.  Follow-up plan: ICM clinic phone appointment on 11/24/2017.   Office appointment scheduled 10/24/2017 with Dr. [name].    Copy of ICM check sent to Dr. [name].   3 month ICM trend: 10/06/2017    1 Year ICM trend:       [name], RN 10/07/2017 5:27 PM

## 2017-10-10 ENCOUNTER — Encounter (INDEPENDENT_AMBULATORY_CARE_PROVIDER_SITE_OTHER): Payer: Self-pay

## 2017-10-14 ENCOUNTER — Ambulatory Visit (INDEPENDENT_AMBULATORY_CARE_PROVIDER_SITE_OTHER): Payer: Medicare Other | Admitting: Internal Medicine

## 2017-10-24 ENCOUNTER — Ambulatory Visit (INDEPENDENT_AMBULATORY_CARE_PROVIDER_SITE_OTHER): Payer: Medicare Other | Admitting: Internal Medicine

## 2017-10-24 ENCOUNTER — Encounter: Payer: Self-pay | Admitting: Internal Medicine

## 2017-10-24 VITALS — BP 95/61 | HR 70 | Ht 62.5 in | Wt 189.6 lb

## 2017-10-24 DIAGNOSIS — Z95810 Presence of automatic (implantable) cardiac defibrillator: Secondary | ICD-10-CM

## 2017-10-24 DIAGNOSIS — I5022 Chronic systolic (congestive) heart failure: Secondary | ICD-10-CM | POA: Diagnosis not present

## 2017-10-24 DIAGNOSIS — I472 Ventricular tachycardia, unspecified: Secondary | ICD-10-CM

## 2017-10-24 DIAGNOSIS — I428 Other cardiomyopathies: Secondary | ICD-10-CM

## 2017-10-24 NOTE — Progress Notes (Signed)
PCP: Eustaquio Maize, MD Primary Cardiologist: Dr Domenic Polite Primary EP: Dr Rogue Jury Melissa Paul is a 72 y.o. female who presents today for routine electrophysiology followup.  Since last being seen in our clinic, the patient reports doing reasonably well.  She is not very active.  + SOB and fatigue.  Today, she denies symptoms of palpitations, chest pain, lower extremity edema, dizziness, presyncope, syncope, or ICD shocks.  The patient is otherwise without complaint today.   Past Medical History:  Diagnosis Date  . Chronic systolic heart failure ([place])   . Glucose intolerance (pre-diabetes)   . Hyperlipemia   . Hypothyroidism   . LBBB (left bundle branch block)   . Mitral regurgitation    Mild to Moderate  . Nonischemic cardiomyopathy ([place])    LVEF 35-40% by echo 8/13  . Paroxysmal atrial fibrillation (HCC)    Brief episodes by device interrogation  . UTI (urinary tract infection)    Recurrent  . Ventricular tachycardia ([place])    ICD therapy for VT   Past Surgical History:  Procedure Laterality Date  . APPENDECTOMY    . BIV ICD GENERATOR CHANGEOUT N/A 07/11/2016   Procedure: BiV ICD Generator Changeout;  Surgeon: Thompson Grayer, MD;  Location: [place] [facility];  Service: Cardiovascular;  Laterality: N/A;  . CARDIAC DEFIBRILLATOR PLACEMENT  09/2009   St. Jude BiV ICD by Dr. Sandria Manly class III  . CHOLECYSTECTOMY    . has one ovary left    . KNEE ARTHROSCOPY     Left  . LEFT AND RIGHT HEART CATHETERIZATION WITH CORONARY ANGIOGRAM N/A 04/12/2014   Procedure: LEFT AND RIGHT HEART CATHETERIZATION WITH CORONARY ANGIOGRAM;  Surgeon: Leonie Man, MD;  Location: [facility] CATH LAB;  Service: Cardiovascular;  Laterality: N/A;  . Left breast biopsy x 2     no maliganancy  . TOTAL ABDOMINAL HYSTERECTOMY     (R) ovary removed    ROS- all systems are reviewed and negative except as per HPI above  Current Outpatient Medications  Medication Sig Dispense Refill  . aspirin EC 81 MG  tablet Take 81 mg by mouth daily.    . carvedilol (COREG) 12.5 MG tablet TAKE ONE TABLET BY MOUTH TWICE DAILY 180 tablet 3  . cetirizine (ZYRTEC) 10 MG tablet Take 10 mg by mouth daily as needed for allergies or rhinitis.     Marland Kitchen estradiol (ESTRACE) 0.1 MG/GM vaginal cream Place 1 Applicatorful vaginally at bedtime.    . furosemide (LASIX) 80 MG tablet Take 80 mg by mouth.    . levothyroxine (SYNTHROID, LEVOTHROID) 50 MCG tablet Take 1 tablet (50 mcg total) by mouth daily. 90 tablet 1  . metFORMIN (GLUCOPHAGE) 500 MG tablet Take 1 tablet (500 mg total) by mouth 2 (two) times daily. 180 tablet 0  . pantoprazole (PROTONIX) 40 MG tablet Take 1 tablet (40 mg total) by mouth daily before breakfast. 30 tablet 5  . spironolactone (ALDACTONE) 25 MG tablet Take 1 tablet (25 mg total) by mouth daily. 90 tablet 0   No current facility-administered medications for this visit.     Physical Exam: Vitals:   10/24/17 1000  BP: 95/61  Pulse: 70  SpO2: 98%  Weight: 189 lb 9.6 oz (86 kg)  Height: 5' 2.5" (1.588 m)    GEN- The patient is well appearing, alert and oriented x 3 today.   Head- normocephalic, atraumatic Eyes-  Sclera clear, conjunctiva pink Ears- hearing intact Oropharynx- clear Lungs- Clear to ausculation bilaterally, normal  work of breathing Chest- ICD pocket is well healed Heart- Regular rate and rhythm, no murmurs, rubs or gallops, PMI not laterally displaced GI- soft, NT, ND, + BS Extremities- no clubbing, cyanosis, or edema  ICD interrogation- reviewed in detail today,  See PACEART report    Wt Readings from Last 3 Encounters:  10/24/17 189 lb 9.6 oz (86 kg)  09/05/17 192 lb (87.1 kg)  08/11/17 191 lb 12.8 oz (87 kg)    Assessment and Plan:  1.  Chronic systolic dysfunction/ nonischemic CM euvolemic today Atrial lead sensitivity changed due to oversensing today Stable on an appropriate medical regimen Normal ICD function See Pace Art report followed in ICM device  clinic I will have Amber Seiler review chart and see if she is a candidate for our CRT optimization protocol pending results of echo that she has been previously scheduled to have next week.  2. VF Well controlled  3. afib Burden <1% She has declined anticoagulation (chads2vasc score is 3) If AF burden increases, she may be more willing to consider  Merlin Return in a year  Thompson Grayer MD, [facility] 10/24/2017 10:56 AM

## 2017-10-24 NOTE — Patient Instructions (Signed)
Medication Instructions:  Continue all current medications.  Labwork: none  Testing/Procedures: none  Follow-Up: Your physician wants you to follow up in:  1 year.  You will receive a reminder letter in the mail one-two months in advance.  If you don't receive a letter, please call our office to schedule the follow up appointment - Dr. [name].   Any Other Special Instructions Will Be Listed Below (If Applicable). Remote monitoring is used to monitor your Pacemaker of ICD from home. This monitoring reduces the number of office visits required to check your device to one time per year. It allows [place] to keep an eye on the functioning of your device to ensure it is working properly. You are scheduled for a device check from home on 01/05/2018. You may send your transmission at any time that day. If you have a wireless device, the transmission will be sent automatically. After your physician reviews your transmission, you will receive a postcard with your next transmission date.   If you need a refill on your cardiac medications before your next appointment, please call your pharmacy.

## 2017-10-26 LAB — CUP PACEART INCLINIC DEVICE CHECK
Battery Remaining Longevity: 62 mo
Brady Statistic RA Percent Paced: 91 %
Date Time Interrogation Session: 20191004142954
HighPow Impedance: 80 Ohm
Implantable Lead Implant Date: 20110912
Implantable Lead Implant Date: 20110912
Implantable Lead Implant Date: 20120710
Implantable Lead Location: 753858
Implantable Lead Location: 753859
Implantable Lead Location: 753860
Implantable Lead Model: 7122
Implantable Pulse Generator Implant Date: 20180621
Lead Channel Impedance Value: 362.5 Ohm
Lead Channel Impedance Value: 537.5 Ohm
Lead Channel Impedance Value: 662.5 Ohm
Lead Channel Pacing Threshold Amplitude: 0.5 V
Lead Channel Pacing Threshold Amplitude: 0.625 V
Lead Channel Pacing Threshold Amplitude: 0.75 V
Lead Channel Pacing Threshold Pulse Width: 0.5 ms
Lead Channel Pacing Threshold Pulse Width: 0.5 ms
Lead Channel Pacing Threshold Pulse Width: 0.5 ms
Lead Channel Sensing Intrinsic Amplitude: 12 mV
Lead Channel Sensing Intrinsic Amplitude: 3.8 mV
Lead Channel Setting Pacing Amplitude: 2 V
Lead Channel Setting Pacing Amplitude: 2 V
Lead Channel Setting Pacing Amplitude: 2 V
Lead Channel Setting Pacing Pulse Width: 0.5 ms
Lead Channel Setting Pacing Pulse Width: 0.5 ms
Lead Channel Setting Sensing Sensitivity: 0.5 mV
Pulse Gen Serial Number: 9761374

## 2017-10-28 ENCOUNTER — Telehealth: Payer: Self-pay | Admitting: Internal Medicine

## 2017-10-29 ENCOUNTER — Ambulatory Visit (INDEPENDENT_AMBULATORY_CARE_PROVIDER_SITE_OTHER): Payer: Medicare Other

## 2017-10-29 ENCOUNTER — Other Ambulatory Visit: Payer: Self-pay

## 2017-10-29 DIAGNOSIS — I428 Other cardiomyopathies: Secondary | ICD-10-CM | POA: Diagnosis not present

## 2017-10-30 ENCOUNTER — Telehealth: Payer: Self-pay | Admitting: *Deleted

## 2017-10-30 NOTE — Telephone Encounter (Signed)
Patient informed. 

## 2017-10-30 NOTE — Telephone Encounter (Signed)
-----   Message from Satira Sark, MD sent at 10/30/2017  8:17 AM EDT ----- Results reviewed.  LVEF 35% by follow-up study, less than previously assessed, but current medical regimen will continue for now.  Her low blood pressures at baseline limit addition of other medications.  We will reassess at office follow-up. A copy of this test should be forwarded to Eustaquio Maize, MD.

## 2017-11-03 ENCOUNTER — Telehealth: Payer: Self-pay | Admitting: Internal Medicine

## 2017-11-03 NOTE — Telephone Encounter (Signed)
Patient called in regards to her recent echo. States that she was given results but she does not understand what she was told.Marland Kitchen

## 2017-11-03 NOTE — Telephone Encounter (Signed)
Pt daughter given results and voiced understanding

## 2017-11-06 LAB — CUP PACEART REMOTE DEVICE CHECK
Battery Remaining Longevity: 65 mo
Battery Remaining Percentage: 80 %
Battery Voltage: 2.98 V
Brady Statistic AP VP Percent: 93 %
Brady Statistic AP VS Percent: 1.9 %
Brady Statistic AS VP Percent: 2.2 %
Brady Statistic AS VS Percent: 1 %
Brady Statistic RA Percent Paced: 91 %
Date Time Interrogation Session: 20190916075306
HighPow Impedance: 87 Ohm
HighPow Impedance: 87 Ohm
Implantable Lead Implant Date: 20110912
Implantable Lead Implant Date: 20110912
Implantable Lead Implant Date: 20120710
Implantable Lead Location: 753858
Implantable Lead Location: 753859
Implantable Lead Location: 753860
Implantable Lead Model: 7122
Implantable Pulse Generator Implant Date: 20180621
Lead Channel Impedance Value: 390 Ohm
Lead Channel Impedance Value: 550 Ohm
Lead Channel Impedance Value: 680 Ohm
Lead Channel Pacing Threshold Amplitude: 0.625 V
Lead Channel Pacing Threshold Amplitude: 0.75 V
Lead Channel Pacing Threshold Amplitude: 0.75 V
Lead Channel Pacing Threshold Pulse Width: 0.5 ms
Lead Channel Pacing Threshold Pulse Width: 0.5 ms
Lead Channel Pacing Threshold Pulse Width: 0.5 ms
Lead Channel Sensing Intrinsic Amplitude: 12 mV
Lead Channel Sensing Intrinsic Amplitude: 3.9 mV
Lead Channel Setting Pacing Amplitude: 2 V
Lead Channel Setting Pacing Amplitude: 2 V
Lead Channel Setting Pacing Amplitude: 2 V
Lead Channel Setting Pacing Pulse Width: 0.5 ms
Lead Channel Setting Pacing Pulse Width: 0.5 ms
Lead Channel Setting Sensing Sensitivity: 0.5 mV
Pulse Gen Serial Number: 9761374

## 2017-11-12 ENCOUNTER — Encounter: Payer: Self-pay | Admitting: Pediatrics

## 2017-11-12 ENCOUNTER — Ambulatory Visit (INDEPENDENT_AMBULATORY_CARE_PROVIDER_SITE_OTHER): Payer: Medicare Other | Admitting: Pediatrics

## 2017-11-12 VITALS — BP 106/65 | HR 78 | Temp 97.1°F | Ht 62.5 in | Wt 191.0 lb

## 2017-11-12 DIAGNOSIS — E039 Hypothyroidism, unspecified: Secondary | ICD-10-CM | POA: Diagnosis not present

## 2017-11-12 DIAGNOSIS — Z23 Encounter for immunization: Secondary | ICD-10-CM

## 2017-11-12 DIAGNOSIS — I5022 Chronic systolic (congestive) heart failure: Secondary | ICD-10-CM | POA: Diagnosis not present

## 2017-11-12 DIAGNOSIS — R109 Unspecified abdominal pain: Secondary | ICD-10-CM

## 2017-11-12 DIAGNOSIS — E119 Type 2 diabetes mellitus without complications: Secondary | ICD-10-CM | POA: Diagnosis not present

## 2017-11-12 DIAGNOSIS — D1723 Benign lipomatous neoplasm of skin and subcutaneous tissue of right leg: Secondary | ICD-10-CM

## 2017-11-12 DIAGNOSIS — K5900 Constipation, unspecified: Secondary | ICD-10-CM

## 2017-11-12 DIAGNOSIS — N281 Cyst of kidney, acquired: Secondary | ICD-10-CM

## 2017-11-12 HISTORY — DX: Constipation, unspecified: K59.00

## 2017-11-12 HISTORY — DX: Cyst of kidney, acquired: N28.1

## 2017-11-12 LAB — URINALYSIS
Bilirubin, UA: NEGATIVE
Glucose, UA: NEGATIVE
Nitrite, UA: POSITIVE — AB
Specific Gravity, UA: 1.02 (ref 1.005–1.030)
Urobilinogen, Ur: 1 mg/dL (ref 0.2–1.0)
pH, UA: 5 (ref 5.0–7.5)

## 2017-11-12 NOTE — Progress Notes (Signed)
Subjective:   Patient ID: Melissa Paul, female    DOB: 05-27-1945, 72 y.o.   MRN: [id number] CC: Follow-up (3 month) Multiple medical problems HPI: Melissa Paul is a 72 y.o. female   Constipation: Sometimes going 2 to 3 days in between stools.  Appetite has been okay.  Chronic systolic heart failure: Following with cardiology.  Most recent EF this fall was 35%.  Does have swelling that gets worse by the end of the day.  Usually better by the morning.  Hypothyroidism: Taking medicine regularly.  Abdominal pain: Has pressure in her lower abdomen that is fairly constant.  Does not affect appetite.  Does not get better or worse with eating.  Had a CT scan when she saw GI to evaluate for ischemic causes.  Did find a right kidney cyst that will need follow-up imaging in 6 months, February 2020.  Patient aware.  Lipoma: Has fatty tumors several places on her body, including left distal arm, left elbow area, right distal upper anterior leg.  The one on her right anterior upper leg has been getting bigger over the last 6 months.  It feels different and tingly when she presses on it now.  Relevant past medical, surgical, family and social history reviewed. Allergies and medications reviewed and updated. Social History   Tobacco Use  Smoking Status Former Smoker  . Packs/day: 0.30  . Years: 20.00  . Pack years: 6.00  . Types: Cigarettes  . Start date: 08/03/1949  . Last attempt to quit: 01/22/1996  . Years since quitting: 21.8  Smokeless Tobacco Never Used   ROS: Per HPI   Objective:    BP 106/65   Pulse 78   Temp (!) 97.1 F (36.2 C)   Ht 5' 2.5" (1.588 m)   Wt 191 lb (86.6 kg)   BMI 34.38 kg/m   Wt Readings from Last 3 Encounters:  11/12/17 191 lb (86.6 kg)  10/24/17 189 lb 9.6 oz (86 kg)  09/05/17 192 lb (87.1 kg)    Gen: NAD, alert, cooperative with exam, NCAT EYES: EOMI, no conjunctival injection, or no icterus ENT:  OP without erythema CV: NRRR, normal S1/S2, no  murmur, distal pulses 2+ b/l Resp: CTABL, no wheezes, normal WOB Abd: +BS, soft, mildly tender with deep palpation throughout, ND. no guarding or organomegaly Ext: Non-pitting edema present bilateral ankles, warm Neuro: Alert and oriented MSK: Poorly circumscribed palpable approximately 15 cm fatty tumor palpable right distal anterior leg.  Assessment & Plan:  Melissa Paul was seen today for follow-up.  Diagnoses and all orders for this visit:  Constipation, unspecified constipation type Trial of Linzess, will start on 72 mcg, did not do enough.  Give samples for 145 4 weeks, if not effective, okay to increase to 290.  Chronic systolic heart failure (HCC) -     CMP14+EGFR  Diabetes mellitus without complication (HCC) P8K 3 mo ago 5.7.  Hypothyroidism, unspecified type -     TSH  Lipoma of right lower extremity Growing over last 6 months.  Will start with ultrasound.  -     US SOFT TISSUE LOWER EXTREMITY LIMITED RIGHT (NON-VASCULAR); Future  Abdominal pain, unspecified abdominal location Has lower abdominal pain and pressure.  Does have a cystocele, seen on recent CT scan.  No dysuria.  Will evaluate for infection.  Does have a right kidney cyst that we will need repeat imaging in February.  Patient is aware. -     Urinalysis, Complete -  Urine Culture -     CBC with Differential/Platelet  Need for immunization against influenza -     Flu Vaccine QUAD 36+ mos IM   Follow up plan: Return in about 3 months (around 02/12/2018). Assunta Found, MD [place]

## 2017-11-12 NOTE — Addendum Note (Signed)
Addended by: Denyce Robert on: 11/12/2017 02:53 PM   Modules accepted: Orders

## 2017-11-13 ENCOUNTER — Other Ambulatory Visit: Payer: Self-pay | Admitting: Pediatrics

## 2017-11-13 DIAGNOSIS — N3090 Cystitis, unspecified without hematuria: Secondary | ICD-10-CM

## 2017-11-13 DIAGNOSIS — E039 Hypothyroidism, unspecified: Secondary | ICD-10-CM

## 2017-11-13 LAB — CBC WITH DIFFERENTIAL/PLATELET
Basophils Absolute: 0 10*3/uL (ref 0.0–0.2)
Basos: 1 %
EOS (ABSOLUTE): 0.1 10*3/uL (ref 0.0–0.4)
Eos: 1 %
Hematocrit: 37.4 % (ref 34.0–46.6)
Hemoglobin: 13.2 g/dL (ref 11.1–15.9)
Immature Grans (Abs): 0 10*3/uL (ref 0.0–0.1)
Immature Granulocytes: 0 %
Lymphocytes Absolute: 2.5 10*3/uL (ref 0.7–3.1)
Lymphs: 33 %
MCH: 31.2 pg (ref 26.6–33.0)
MCHC: 35.3 g/dL (ref 31.5–35.7)
MCV: 88 fL (ref 79–97)
Monocytes Absolute: 0.5 10*3/uL (ref 0.1–0.9)
Monocytes: 6 %
Neutrophils Absolute: 4.3 10*3/uL (ref 1.4–7.0)
Neutrophils: 59 %
Platelets: 316 10*3/uL (ref 150–450)
RBC: 4.23 x10E6/uL (ref 3.77–5.28)
RDW: 13.1 % (ref 12.3–15.4)
WBC: 7.4 10*3/uL (ref 3.4–10.8)

## 2017-11-13 LAB — CMP14+EGFR
ALT: 7 IU/L (ref 0–32)
AST: 13 IU/L (ref 0–40)
Albumin/Globulin Ratio: 2.1 (ref 1.2–2.2)
Albumin: 4.5 g/dL (ref 3.5–4.8)
Alkaline Phosphatase: 76 IU/L (ref 39–117)
BUN/Creatinine Ratio: 14 (ref 12–28)
BUN: 12 mg/dL (ref 8–27)
Bilirubin Total: 0.4 mg/dL (ref 0.0–1.2)
CO2: 25 mmol/L (ref 20–29)
Calcium: 9.4 mg/dL (ref 8.7–10.3)
Chloride: 100 mmol/L (ref 96–106)
Creatinine, Ser: 0.84 mg/dL (ref 0.57–1.00)
GFR calc Af Amer: 79 mL/min/{1.73_m2} (ref >59)
GFR calc non Af Amer: 69 mL/min/{1.73_m2} (ref >59)
Globulin, Total: 2.1 g/dL (ref 1.5–4.5)
Glucose: 93 mg/dL (ref 65–99)
Potassium: 3.9 mmol/L (ref 3.5–5.2)
Sodium: 140 mmol/L (ref 134–144)
Total Protein: 6.6 g/dL (ref 6.0–8.5)

## 2017-11-13 LAB — TSH: TSH: 5.42 u[IU]/mL — ABNORMAL HIGH (ref 0.450–4.500)

## 2017-11-13 MED ORDER — LEVOTHYROXINE SODIUM 75 MCG PO TABS: 75.0000 ug | ORAL_TABLET | Freq: Every day | ORAL | 1 refills | Status: DC

## 2017-11-13 MED ORDER — CEFUROXIME AXETIL 250 MG PO TABS: 250.0000 mg | ORAL_TABLET | Freq: Two times a day (BID) | ORAL | 0 refills | Status: DC

## 2017-11-14 LAB — URINE CULTURE

## 2017-11-19 ENCOUNTER — Ambulatory Visit (HOSPITAL_COMMUNITY): Payer: Medicare Other

## 2017-11-20 ENCOUNTER — Other Ambulatory Visit: Payer: Self-pay | Admitting: Cardiology

## 2017-11-22 ENCOUNTER — Ambulatory Visit (INDEPENDENT_AMBULATORY_CARE_PROVIDER_SITE_OTHER): Payer: Medicare Other | Admitting: Pediatrics

## 2017-11-22 ENCOUNTER — Encounter: Payer: Self-pay | Admitting: Pediatrics

## 2017-11-22 VITALS — BP 89/49 | HR 66 | Temp 100.2°F | Ht 62.5 in | Wt 190.0 lb

## 2017-11-22 DIAGNOSIS — I959 Hypotension, unspecified: Secondary | ICD-10-CM | POA: Diagnosis not present

## 2017-11-22 DIAGNOSIS — R062 Wheezing: Secondary | ICD-10-CM

## 2017-11-22 DIAGNOSIS — J441 Chronic obstructive pulmonary disease with (acute) exacerbation: Secondary | ICD-10-CM

## 2017-11-22 MED ORDER — ALBUTEROL SULFATE (2.5 MG/3ML) 0.083% IN NEBU: 2.5000 mg | INHALATION_SOLUTION | Freq: Four times a day (QID) | RESPIRATORY_TRACT | 1 refills | Status: DC | PRN

## 2017-11-22 MED ORDER — PREDNISONE 20 MG PO TABS: ORAL_TABLET | ORAL | 0 refills | Status: DC

## 2017-11-22 MED ORDER — AZITHROMYCIN 250 MG PO TABS: ORAL_TABLET | ORAL | 0 refills | Status: DC

## 2017-11-22 NOTE — Patient Instructions (Signed)
Because of low blood pressures: Hold lasix and spironolactone for 3 days.  If you start having swelling, OK to start back 1/2 tab lasix and spironolactone.

## 2017-11-22 NOTE — Progress Notes (Signed)
  Subjective:   Patient ID: Melissa Paul, female    DOB: 03/20/1945, 72 y.o.   MRN: [id number] CC: Cough (productive cough x 3 or 4 days and right lower rib pain. Has not taken anything OTC.)  HPI: Melissa Paul is a 72 y.o. female   Low grade temperatures at home, feeling run down. Cough bothering her the most. Has been productive. Having lots of sinus drainage. Continues to have dysuria. Has not yet picked up abx to treat cystitis due to needing to wait until monthly check came in.   Appetite has been down. Not eating or drinking much for past day.  No lightheadedness, dizziness.  Relevant past medical, surgical, family and social history reviewed. Allergies and medications reviewed and updated. Social History   Tobacco Use  Smoking Status Former Smoker  . Packs/day: 0.30  . Years: 20.00  . Pack years: 6.00  . Types: Cigarettes  . Start date: 08/03/1949  . Last attempt to quit: 01/22/1996  . Years since quitting: 21.8  Smokeless Tobacco Never Used   ROS: Per HPI   Objective:    BP (!) 89/49 (BP Location: Right Arm, Patient Position: Sitting, Cuff Size: Large)   Pulse 66   Temp 100.2 F (37.9 C) (Oral)   Ht 5' 2.5" (1.588 m)   Wt 190 lb (86.2 kg)   BMI 34.20 kg/m   Wt Readings from Last 3 Encounters:  11/22/17 190 lb (86.2 kg)  11/12/17 191 lb (86.6 kg)  10/24/17 189 lb 9.6 oz (86 kg)    Gen: NAD, alert, cooperative with exam, NCAT EYES: EOMI, no conjunctival injection, or no icterus ENT:  TMs pearly gray b/l, OP without erythema LYMPH: no cervical LAD CV: NRRR, normal S1/S2, no murmur, distal pulses 2+ b/l Resp: moving air fair, b/l exp wheezes, b/l rhonchi, comfortable WOB Abd: +BS, soft, NTND.  Ext: No edema, warm Neuro: Alert and oriented, strength equal b/l UE and LE, coordination grossly normal  MSK: normal muscle bulk  Assessment & Plan:  Melissa Paul was seen today for cough.  Diagnoses and all orders for this visit:  COPD exacerbation  ([place]) Wheezing Low grade temp, new wheezing, treat with below. Return precautions discussed. Any worsening needs to be seen. Will be able to pick up medicines today. -     predniSONE (DELTASONE) 20 MG tablet; 2 po at same time daily for 5 days -     azithromycin (ZITHROMAX) 250 MG tablet; Take 2 the first day and then one each day after. -     DME Nebulizer machine -     albuterol (PROVENTIL) (2.5 MG/3ML) 0.083% nebulizer solution; Take 3 mLs (2.5 mg total) by nebulization every 6 (six) hours as needed for wheezing or shortness of breath.  Hypotension, unspecified hypotension type Decreased PO intake past day. No lightheadedness. Hold diuretics for a couple of days, then restart. Does not have BP cuff at home.   Follow up plan: Return in about 1 week (around 11/29/2017). Sooner if needed. Assunta Found, MD [place]

## 2017-11-24 ENCOUNTER — Ambulatory Visit (INDEPENDENT_AMBULATORY_CARE_PROVIDER_SITE_OTHER): Payer: Medicare Other

## 2017-11-24 DIAGNOSIS — Z95810 Presence of automatic (implantable) cardiac defibrillator: Secondary | ICD-10-CM

## 2017-11-24 DIAGNOSIS — I5022 Chronic systolic (congestive) heart failure: Secondary | ICD-10-CM

## 2017-11-25 NOTE — Progress Notes (Signed)
EPIC Encounter for ICM Monitoring  Patient Name: Melissa Paul is a 72 y.o. female Date: 11/25/2017 Primary Care Physican: Eustaquio Maize, MD Primary Cardiologist:McDowell Electrophysiologist: Allred Dry Weight:182lbs (base weight 182-184lbs) Bi-V Pacing: 95%      Heart Failure questions reviewed, pt has a cold.  She is taking prednisone and z pack.  PCP stopped Furosemide x 3 days and she resumes tomorrow.  Office visit with PCP 11/2 for cough, sinus drainage, low grade fever and UTI.   Thoracic impedance abnormal suggesting fluid accumulation starting 11/24/2017.   Prescribed: Furosemide 80 mgdaily.  Labs: 11/12/2017 Creatinine 0.84, BUN 12, Potassium 3.9, Sodium 140, eGFR 69-79 08/06/2017 Creatinine 1.01, BUN 12, Potassium 4.2, Sodium 140, EGFR 55-63 02/27/2017 Creatinine 0.95, BUN 10, Potassium 3.8, Sodium 138, EGFR 58->60  Recommendations:  She resumes Furosemide tomorrow as ordered by PCP.  She has ultrasound on leg growth next week.  Encouraged to call for fluid symptoms.  Follow-up plan: ICM clinic phone appointment on 12/04/2017 to recheck fluid levels.     Copy of ICM check sent to Dr. [name] and Dr Domenic Polite.   3 month ICM trend: 11/24/2017    AT/AF   1 Year ICM trend:       [name], RN 11/25/2017 10:41 AM

## 2017-11-27 ENCOUNTER — Ambulatory Visit (HOSPITAL_COMMUNITY)
Admission: RE | Admit: 2017-11-27 | Discharge: 2017-11-27 | Disposition: A | Discharge status: Home / Self Care | Payer: Medicare Other | Source: Ambulatory Visit | Attending: Pediatrics | Admitting: Pediatrics

## 2017-11-27 DIAGNOSIS — D1723 Benign lipomatous neoplasm of skin and subcutaneous tissue of right leg: Secondary | ICD-10-CM

## 2017-11-28 ENCOUNTER — Ambulatory Visit (INDEPENDENT_AMBULATORY_CARE_PROVIDER_SITE_OTHER): Payer: Medicare Other | Admitting: Pediatrics

## 2017-11-28 ENCOUNTER — Encounter: Payer: Self-pay | Admitting: Pediatrics

## 2017-11-28 ENCOUNTER — Ambulatory Visit (HOSPITAL_COMMUNITY): Admission: RE | Admit: 2017-11-28 | Payer: Medicare Other | Source: Ambulatory Visit

## 2017-11-28 VITALS — BP 120/76 | HR 74 | Temp 96.8°F | Resp 20 | Ht 62.5 in | Wt 192.2 lb

## 2017-11-28 DIAGNOSIS — J441 Chronic obstructive pulmonary disease with (acute) exacerbation: Secondary | ICD-10-CM | POA: Diagnosis not present

## 2017-11-28 DIAGNOSIS — D1723 Benign lipomatous neoplasm of skin and subcutaneous tissue of right leg: Secondary | ICD-10-CM | POA: Diagnosis not present

## 2017-11-28 DIAGNOSIS — N3090 Cystitis, unspecified without hematuria: Secondary | ICD-10-CM | POA: Diagnosis not present

## 2017-11-28 MED ORDER — CEFUROXIME AXETIL 250 MG PO TABS: 250.0000 mg | ORAL_TABLET | Freq: Two times a day (BID) | ORAL | 0 refills | Status: DC

## 2017-11-28 NOTE — Progress Notes (Signed)
  Subjective:   Patient ID: Melissa Paul, female    DOB: 09/17/1945, 71 y.o.   MRN: [id number] CC: COPD (1 week recheck) and Hypotension (1 week recheck)  HPI: Melissa Paul is a 72 y.o. female   Seen 1 week ago for fever, COPD exacerbation, wheezing.  Treated with azithromycin and prednisone.  Feeling much better today.  Still with some congestion in her sinuses.  Cough is better.  Using albuterol about twice a day.  Does help with wheezing still.  Was not able to pick up cefuroxime to treat recent UTI, still with ongoing symptoms.  Recent ultrasound to evaluate soft tissue mass above right knee.  Ultrasound did not see blood supply, but possible seroma versus hematoma.  Recommend MRI.  Patient has pacemaker, not able to get an MRI.  She is noticed that the area has grown from being a silver dollar size to going all the way across from medial to lateral on her leg above the knee over the last few months.  Does have some pain associated with it.  Relevant past medical, surgical, family and social history reviewed. Allergies and medications reviewed and updated. Social History   Tobacco Use  Smoking Status Former Smoker  . Packs/day: 0.30  . Years: 20.00  . Pack years: 6.00  . Types: Cigarettes  . Start date: 08/03/1949  . Last attempt to quit: 01/22/1996  . Years since quitting: 21.8  Smokeless Tobacco Never Used   ROS: Per HPI   Objective:    BP 120/76   Pulse 74   Temp (!) 96.8 F (36 C) (Oral)   Resp 20   Ht 5' 2.5" (1.588 m)   Wt 192 lb 3.2 oz (87.2 kg)   SpO2 97%   BMI 34.59 kg/m   Wt Readings from Last 3 Encounters:  11/28/17 192 lb 3.2 oz (87.2 kg)  11/22/17 190 lb (86.2 kg)  11/12/17 191 lb (86.6 kg)    Gen: NAD, alert, cooperative with exam, NCAT, + nasal congestion EYES: EOMI, no conjunctival injection, or no icterus ENT:  TMs pearly gray b/l with layering white effusion, OP without erythema LYMPH: no cervical LAD CV: NRRR, normal S1/S2, no murmur,  distal pulses 2+ b/l Resp: CTABL, no wheezes, normal WOB Abd: +BS, soft, NTND.  Ext: No pitting edema, warm Neuro: Alert and oriented MSK: normal muscle bulk  Assessment & Plan:  Melissa Paul was seen today for copd and hypotension.  Diagnoses and all orders for this visit:  COPD exacerbation ([place]) Much improved.  Continue albuterol as needed for the next few days.  Cystitis Start below. -     cefUROXime (CEFTIN) 250 MG tablet; Take 1 tablet (250 mg total) by mouth 2 (two) times daily with a meal.  Lipoma of right lower extremity -     Ambulatory referral to Orthopedic Surgery  Hypotension Resolved.  Okay to go back to regular medicine dosing. Follow up plan: No follow-ups on file. Assunta Found, MD [place]

## 2017-12-04 ENCOUNTER — Ambulatory Visit (INDEPENDENT_AMBULATORY_CARE_PROVIDER_SITE_OTHER): Payer: Medicare Other

## 2017-12-04 DIAGNOSIS — I5022 Chronic systolic (congestive) heart failure: Secondary | ICD-10-CM

## 2017-12-04 DIAGNOSIS — Z95810 Presence of automatic (implantable) cardiac defibrillator: Secondary | ICD-10-CM

## 2017-12-04 NOTE — Progress Notes (Signed)
EPIC Encounter for ICM Monitoring  Patient Name: Melissa Paul is a 72 y.o. female Date: 12/04/2017 Primary Care Physican: Eustaquio Maize, MD Primary Cardiologist:McDowell Electrophysiologist: Allred Bi-V Pacing: 95%       Last Weight: 182lbs (base weight 182-184lbs) Today's Weight: 181 lbs      Heart Failure questions reviewed, pt asymptomatic.  Patient was being treated for resp infection with z-pack and prednisone.   Thoracic impedance abnormal suggesting dryness since last ICM remote transmission on 11/24/2017 but trending back to baseline.  PCP held Furosemide 11/4 - 11/7 by PCP and then dosage resumed 11/8.  Prescribed: Furosemide 80 mgdaily.  Labs: 11/12/2017 Creatinine 0.84, BUN 12, Potassium 3.9, Sodium 140, eGFR 69-79 08/06/2017 Creatinine 1.01, BUN 12, Potassium 4.2, Sodium 140, EGFR 55-63 02/27/2017 Creatinine 0.95, BUN 10, Potassium 3.8, Sodium 138, EGFR 58->60  Recommendations: No changes.   Encouraged to call for fluid symptoms.  Follow-up plan: ICM clinic phone appointment on 01/05/2018.       Copy of ICM check sent to Dr. [name].   3 month ICM trend: 12/04/2017    AT/AF   1 Year ICM trend:       [name], RN 12/04/2017 12:41 PM

## 2017-12-15 ENCOUNTER — Ambulatory Visit: Payer: Medicare Other | Admitting: Pediatrics

## 2017-12-23 ENCOUNTER — Other Ambulatory Visit: Payer: Self-pay | Admitting: Pediatrics

## 2018-01-05 ENCOUNTER — Ambulatory Visit (INDEPENDENT_AMBULATORY_CARE_PROVIDER_SITE_OTHER): Payer: Medicare Other

## 2018-01-05 ENCOUNTER — Ambulatory Visit: Payer: Medicare Other

## 2018-01-05 DIAGNOSIS — I428 Other cardiomyopathies: Secondary | ICD-10-CM | POA: Diagnosis not present

## 2018-01-05 DIAGNOSIS — I5022 Chronic systolic (congestive) heart failure: Secondary | ICD-10-CM

## 2018-01-05 DIAGNOSIS — Z95810 Presence of automatic (implantable) cardiac defibrillator: Secondary | ICD-10-CM

## 2018-01-05 NOTE — Progress Notes (Signed)
EPIC Encounter for ICM Monitoring  Patient Name: Melissa Paul is a 72 y.o. female Date: 01/05/2018 Primary Care Physican: Eustaquio Maize, MD Primary Cardiologist:McDowell Electrophysiologist: Allred Bi-V Pacing: 95% Last Weight: 182lbs (base weight 182-184lbs) Today's Weight: 181 lbs  AT/AF Burden: >1%                                                          Transmission reviewed   Thoracic impedance normal.  Prescribed: Furosemide 80 mgdaily.  Labs: 11/12/2017 Creatinine 0.84, BUN 12, Potassium 3.9, Sodium 140, eGFR 69-79 08/06/2017 Creatinine 1.01, BUN 12, Potassium 4.2, Sodium 140, EGFR 55-63 02/27/2017 Creatinine 0.95, BUN 10, Potassium 3.8, Sodium 138, EGFR 58->60  Recommendations: None  Follow-up plan: ICM clinic phone appointment on 02/09/2018.       Copy of ICM check sent to Dr. [name].   AT/AF   3 month ICM trend: 01/05/2018    1 Year ICM trend:       [name], RN 01/05/2018 5:35 PM

## 2018-01-05 NOTE — Progress Notes (Signed)
Remote ICD transmission.   

## 2018-01-06 ENCOUNTER — Encounter: Payer: Self-pay | Admitting: Cardiology

## 2018-02-09 ENCOUNTER — Ambulatory Visit (INDEPENDENT_AMBULATORY_CARE_PROVIDER_SITE_OTHER): Payer: Medicare Other

## 2018-02-09 DIAGNOSIS — I5022 Chronic systolic (congestive) heart failure: Secondary | ICD-10-CM

## 2018-02-09 DIAGNOSIS — Z95810 Presence of automatic (implantable) cardiac defibrillator: Secondary | ICD-10-CM

## 2018-02-10 NOTE — Progress Notes (Signed)
EPIC Encounter for ICM Monitoring  Patient Name: Melissa Paul is a 73 y.o. female Date: 02/10/2018 Primary Care Physican: Eustaquio Maize, MD Primary Cardiologist:McDowell Electrophysiologist: Allred Bi-V Pacing: 95% Last Weight:181lbs  Today's Weight:177-179lbs  AT/AF Burden: >1%  Heart failure questions reviewed.  She is feeling fine.   Thoracic impedancenormal.  Prescribed:Furosemide 80 mgdaily.  Labs: 11/12/2017 Creatinine 0.84, BUN 12, Potassium 3.9, Sodium 140, eGFR 69-79 08/06/2017 Creatinine 1.01, BUN 12, Potassium 4.2, Sodium 140, EGFR 55-63 02/27/2017 Creatinine 0.95, BUN 10, Potassium 3.8, Sodium 138, EGFR 58->60  Recommendations: No changes. Encouraged to call for any fluid symptoms  Follow-up plan: ICM clinic phone appointment on2/24/2020.   Copy of ICM check sent to Dr.Allred.    3 month ICM trend: 02/09/2018    AT/AF   1 Year ICM trend:       [name], RN 02/10/2018 3:58 PM

## 2018-02-13 ENCOUNTER — Encounter (INDEPENDENT_AMBULATORY_CARE_PROVIDER_SITE_OTHER): Payer: Self-pay | Admitting: *Deleted

## 2018-02-13 ENCOUNTER — Ambulatory Visit: Payer: Medicare Other | Admitting: Pediatrics

## 2018-02-15 LAB — CUP PACEART REMOTE DEVICE CHECK
Battery Remaining Longevity: 61 mo
Battery Remaining Percentage: 76 %
Battery Voltage: 2.96 V
Brady Statistic AP VP Percent: 92 %
Brady Statistic AP VS Percent: 1.9 %
Brady Statistic AS VP Percent: 3.7 %
Brady Statistic AS VS Percent: 1 %
Brady Statistic RA Percent Paced: 90 %
Date Time Interrogation Session: 20191216082624
HighPow Impedance: 75 Ohm
HighPow Impedance: 75 Ohm
Implantable Lead Implant Date: 20110912
Implantable Lead Implant Date: 20110912
Implantable Lead Implant Date: 20120710
Implantable Lead Location: 753858
Implantable Lead Location: 753859
Implantable Lead Location: 753860
Implantable Lead Model: 7122
Implantable Pulse Generator Implant Date: 20180621
Lead Channel Impedance Value: 360 Ohm
Lead Channel Impedance Value: 550 Ohm
Lead Channel Impedance Value: 600 Ohm
Lead Channel Pacing Threshold Amplitude: 0.5 V
Lead Channel Pacing Threshold Amplitude: 0.75 V
Lead Channel Pacing Threshold Amplitude: 0.75 V
Lead Channel Pacing Threshold Pulse Width: 0.5 ms
Lead Channel Pacing Threshold Pulse Width: 0.5 ms
Lead Channel Pacing Threshold Pulse Width: 0.5 ms
Lead Channel Sensing Intrinsic Amplitude: 12 mV
Lead Channel Sensing Intrinsic Amplitude: 3.4 mV
Lead Channel Setting Pacing Amplitude: 2 V
Lead Channel Setting Pacing Amplitude: 2 V
Lead Channel Setting Pacing Amplitude: 2 V
Lead Channel Setting Pacing Pulse Width: 0.5 ms
Lead Channel Setting Pacing Pulse Width: 0.5 ms
Lead Channel Setting Sensing Sensitivity: 0.5 mV
Pulse Gen Serial Number: 9761374

## 2018-03-06 ENCOUNTER — Ambulatory Visit: Payer: Medicare Other | Admitting: Cardiology

## 2018-03-18 ENCOUNTER — Telehealth: Payer: Self-pay

## 2018-03-18 NOTE — Telephone Encounter (Signed)
Left message for patient to remind of missed remote transmission.  

## 2018-03-20 NOTE — Progress Notes (Signed)
No ICM remote transmission received for 03/16/2018 and next ICM transmission scheduled for 04/07/2018.

## 2018-03-21 ENCOUNTER — Other Ambulatory Visit: Payer: Self-pay | Admitting: Pediatrics

## 2018-04-01 ENCOUNTER — Encounter: Payer: Self-pay | Admitting: Family

## 2018-04-01 ENCOUNTER — Ambulatory Visit (INDEPENDENT_AMBULATORY_CARE_PROVIDER_SITE_OTHER): Payer: Medicare Other | Admitting: Family

## 2018-04-01 ENCOUNTER — Other Ambulatory Visit: Payer: Self-pay

## 2018-04-01 VITALS — BP 88/57 | HR 70 | Temp 97.1°F | Ht 62.5 in | Wt 180.0 lb

## 2018-04-01 DIAGNOSIS — N3001 Acute cystitis with hematuria: Secondary | ICD-10-CM

## 2018-04-01 DIAGNOSIS — N3090 Cystitis, unspecified without hematuria: Secondary | ICD-10-CM | POA: Diagnosis not present

## 2018-04-01 DIAGNOSIS — R1030 Lower abdominal pain, unspecified: Secondary | ICD-10-CM | POA: Diagnosis not present

## 2018-04-01 LAB — URINALYSIS, COMPLETE
Bilirubin, UA: NEGATIVE
Glucose, UA: NEGATIVE
Nitrite, UA: POSITIVE — AB
Protein, UA: NEGATIVE
Specific Gravity, UA: 1.02 (ref 1.005–1.030)
Urobilinogen, Ur: 0.2 mg/dL (ref 0.2–1.0)
pH, UA: 5.5 (ref 5.0–7.5)

## 2018-04-01 LAB — MICROSCOPIC EXAMINATION: Epithelial Cells (non renal): >10 /hpf — AB (ref 0–10)

## 2018-04-01 MED ORDER — CEFUROXIME AXETIL 250 MG PO TABS: 250.0000 mg | ORAL_TABLET | Freq: Two times a day (BID) | ORAL | 0 refills | Status: DC

## 2018-04-01 NOTE — Progress Notes (Signed)
Subjective:    Patient ID: Melissa Paul, female    DOB: 14-Nov-1945, 73 y.o.   MRN: [id number]  Chief Complaint  Patient presents with  . Cystitis    Dysuria   This is a new problem. The current episode started 1 to 4 weeks ago. The problem has been gradually worsening. The quality of the pain is described as burning. The pain is at a severity of 8/10. Associated symptoms include flank pain, hesitancy and urgency. Pertinent negatives include no frequency, hematuria, nausea or vomiting. She has tried increased fluids for the symptoms. The treatment provided no relief.      Review of Systems  Gastrointestinal: Negative for nausea and vomiting.  Genitourinary: Positive for dysuria, flank pain, hesitancy and urgency. Negative for frequency and hematuria.  All other systems reviewed and are negative.      Objective:   Physical Exam Vitals signs reviewed.  Constitutional:      General: She is not in acute distress.    Appearance: She is well-developed.  HENT:     Head: Normocephalic and atraumatic.     Right Ear: Tympanic membrane normal.     Left Ear: Tympanic membrane normal.  Eyes:     Pupils: Pupils are equal, round, and reactive to light.  Neck:     Musculoskeletal: Normal range of motion and neck supple.     Thyroid: No thyromegaly.  Cardiovascular:     Rate and Rhythm: Normal rate and regular rhythm.     Heart sounds: Normal heart sounds. No murmur.  Pulmonary:     Effort: Pulmonary effort is normal. No respiratory distress.     Breath sounds: Normal breath sounds. No wheezing.  Abdominal:     General: Bowel sounds are normal. There is no distension.     Palpations: Abdomen is soft.     Tenderness: There is abdominal tenderness (lower). There is no right CVA tenderness or left CVA tenderness.  Musculoskeletal: Normal range of motion.        General: No tenderness.  Skin:    General: Skin is warm and dry.  Neurological:     Mental Status: She is alert and  oriented to person, place, and time.     Cranial Nerves: No cranial nerve deficit.     Deep Tendon Reflexes: Reflexes are normal and symmetric.  Psychiatric:        Behavior: Behavior normal.        Thought Content: Thought content normal.        Judgment: Judgment normal.     BP (!) 89/61   Pulse 69   Temp (!) 97.1 F (36.2 C) (Oral)   Ht 5' 2.5" (1.588 m)   Wt 180 lb (81.6 kg)   SpO2 96%   BMI 32.40 kg/m      Assessment & Plan:  [name] comes in today with chief complaint of Cystitis   Diagnosis and orders addressed:  1. Lower abdominal pain - Urinalysis, Complete  2. Cystitis - cefUROXime (CEFTIN) 250 MG tablet; Take 1 tablet (250 mg total) by mouth 2 (two) times daily with a meal.  Dispense: 14 tablet; Refill: 0  3. Acute cystitis with hematuria Force fluids AZO over the counter X2 days RTO prn Culture pending RTO in 1 week Pt has multiple allergies, but states she can take Ceftin. She states she takes benadryl before taking the medicaiton - cefUROXime (CEFTIN) 250 MG tablet; Take 1 tablet (250 mg total) by mouth 2 (two)  times daily with a meal.  Dispense: 14 tablet; Refill: 0    Evelina Dun, FNP

## 2018-04-01 NOTE — Patient Instructions (Signed)

## 2018-04-06 ENCOUNTER — Ambulatory Visit (INDEPENDENT_AMBULATORY_CARE_PROVIDER_SITE_OTHER): Payer: Medicare Other | Admitting: *Deleted

## 2018-04-06 ENCOUNTER — Other Ambulatory Visit: Payer: Self-pay

## 2018-04-06 DIAGNOSIS — I428 Other cardiomyopathies: Secondary | ICD-10-CM

## 2018-04-07 ENCOUNTER — Other Ambulatory Visit: Payer: Self-pay

## 2018-04-07 ENCOUNTER — Ambulatory Visit (INDEPENDENT_AMBULATORY_CARE_PROVIDER_SITE_OTHER): Payer: Medicare Other

## 2018-04-07 DIAGNOSIS — Z95810 Presence of automatic (implantable) cardiac defibrillator: Secondary | ICD-10-CM | POA: Diagnosis not present

## 2018-04-07 DIAGNOSIS — I5022 Chronic systolic (congestive) heart failure: Secondary | ICD-10-CM | POA: Diagnosis not present

## 2018-04-07 LAB — CUP PACEART REMOTE DEVICE CHECK
Battery Remaining Longevity: 59 mo
Battery Remaining Percentage: 73 %
Battery Voltage: 2.96 V
Brady Statistic AP VP Percent: 93 %
Brady Statistic AP VS Percent: 1.6 %
Brady Statistic AS VP Percent: 3.7 %
Brady Statistic AS VS Percent: 1 %
Brady Statistic RA Percent Paced: 91 %
Date Time Interrogation Session: 20200316192358
HighPow Impedance: 86 Ohm
HighPow Impedance: 86 Ohm
Implantable Lead Implant Date: 20110912
Implantable Lead Implant Date: 20110912
Implantable Lead Implant Date: 20120710
Implantable Lead Location: 753858
Implantable Lead Location: 753859
Implantable Lead Location: 753860
Implantable Lead Model: 7122
Implantable Pulse Generator Implant Date: 20180621
Lead Channel Impedance Value: 360 Ohm
Lead Channel Impedance Value: 540 Ohm
Lead Channel Impedance Value: 740 Ohm
Lead Channel Pacing Threshold Amplitude: 0.5 V
Lead Channel Pacing Threshold Amplitude: 0.625 V
Lead Channel Pacing Threshold Amplitude: 0.75 V
Lead Channel Pacing Threshold Pulse Width: 0.5 ms
Lead Channel Pacing Threshold Pulse Width: 0.5 ms
Lead Channel Pacing Threshold Pulse Width: 0.5 ms
Lead Channel Sensing Intrinsic Amplitude: 12 mV
Lead Channel Sensing Intrinsic Amplitude: 3.8 mV
Lead Channel Setting Pacing Amplitude: 2 V
Lead Channel Setting Pacing Amplitude: 2 V
Lead Channel Setting Pacing Amplitude: 2 V
Lead Channel Setting Pacing Pulse Width: 0.5 ms
Lead Channel Setting Pacing Pulse Width: 0.5 ms
Lead Channel Setting Sensing Sensitivity: 0.5 mV
Pulse Gen Serial Number: 9761374

## 2018-04-08 NOTE — Progress Notes (Signed)
EPIC Encounter for ICM Monitoring  Patient Name: Melissa Paul is a 73 y.o. female Date: 04/08/2018 Primary Care Physican: Sharion Balloon, FNP Primary Cardiologist:McDowell Electrophysiologist: Allred Bi-V Pacing: 96% Last Weight: 174 lbs 04/08/2018 Weight: 178 lbs  AT/AF Burden: >1%  Heart failure questions reviewed.  Her legs feel tight, slightly swollen, and 4 lb weight gain in last week.  She has 3 more days of antibiotic for UTI.     Thoracic impedanceabnormal suggesting fluid accumulation since 04/03/2018 and trending back toward baseline.  Impedance also decreased 03/20/2018 through 03/27/2018.  Prescribed:Furosemide 80 mgdaily.  Labs: 11/12/2017 Creatinine 0.84, BUN 12, Potassium 3.9, Sodium 140, GFR 69-79 08/06/2017 Creatinine 1.01, BUN 12, Potassium 4.2, Sodium 140, GFR 55-63 02/27/2017 Creatinine 0.95, BUN 10, Potassium 3.8, Sodium 138, GFR 58->60  Recommendations: She plans on taking 40 mg extra of Furosemide for a couple of days after she completes round of antibiotics.    Follow-up plan: ICM clinic phone appointment on3/26/2020 to recheck fluid levels.     Office appt 04/20/2018 with Dr. Domenic Polite.    Copy of ICM check sent to Dr. [name] and Dr Domenic Polite.   AT/AF   3 month ICM trend: 04/06/2018    1 Year ICM trend:       [name], RN 04/08/2018 10:52 AM

## 2018-04-10 ENCOUNTER — Ambulatory Visit: Payer: Medicare Other | Admitting: Family

## 2018-04-13 ENCOUNTER — Encounter: Payer: Self-pay | Admitting: Cardiology

## 2018-04-13 NOTE — Progress Notes (Signed)
Remote ICD transmission.   

## 2018-04-15 ENCOUNTER — Telehealth: Payer: Self-pay | Admitting: *Deleted

## 2018-04-15 NOTE — Telephone Encounter (Signed)
   Cardiac Questionnaire:    Since your last visit or hospitalization:    1. Have you been having new or worsening chest pain? Yes rated 6/10 only when eating   2. Have you been having new or worsening shortness of breath? Yes 3. Have you been having new or worsening leg swelling, wt gain, or increase in abdominal girth (pants fitting more tightly)? Yes   4. Have you had any passing out spells? No    *A YES to any of these questions would result in the appointment being kept. Advised if symptoms got worse between now and her appointment-to go to the ED for an evaluation. Verbalized understanding.   _____________   [id number] Pre-Screening Questions:  . Do you currently have a fever? No . Have you recently travelled on a cruise, internationally, or to [place], [place], [place], [place], [place], or [place], [place] [employer]) ? No . Have you been in contact with someone that is currently pending confirmation of Covid19 testing or has been confirmed to have the [place] virus?  No Are you currently experiencing fatigue or cough?  Jonette Eva to put mask on upon entering office

## 2018-04-16 ENCOUNTER — Ambulatory Visit (INDEPENDENT_AMBULATORY_CARE_PROVIDER_SITE_OTHER): Payer: Medicare Other

## 2018-04-16 ENCOUNTER — Other Ambulatory Visit: Payer: Self-pay

## 2018-04-16 DIAGNOSIS — Z95810 Presence of automatic (implantable) cardiac defibrillator: Secondary | ICD-10-CM

## 2018-04-16 DIAGNOSIS — I5022 Chronic systolic (congestive) heart failure: Secondary | ICD-10-CM

## 2018-04-17 ENCOUNTER — Telehealth: Payer: Self-pay

## 2018-04-17 NOTE — Progress Notes (Signed)
EPIC Encounter for ICM Monitoring  Patient Name: Melissa Paul is a 73 y.o. female Date: 04/17/2018 Primary Care Physican: Sharion Balloon, FNP Primary Cardiologist:McDowell Electrophysiologist: Allred Bi-V Pacing: 96% Last Weight: 174 lbs 04/08/2018 Weight: 178 lbs 04/17/2018 Weight:  Unknown  AT/AF Burden: >1%  Attempted call to patient and unable to reach.   Transmission reviewed.   Heart failure questions reviewed. Her legs feel tight, slightly swollen, and 4 lb weight gain in last week.  She has 3 more days of antibiotic for UTI.     Thoracic impedancereturned to normal after taking extra Furosemide on 04/07/2018 but started reaccumulating fluid 04/14/2018.  Prescribed:Furosemide 80 mgdaily.  Labs: 11/12/2017 Creatinine 0.84, BUN 12, Potassium 3.9, Sodium 140, GFR 69-79 08/06/2017 Creatinine 1.01, BUN 12, Potassium 4.2, Sodium 140, GFR 55-63 02/27/2017 Creatinine 0.95, BUN 10, Potassium 3.8, Sodium 138, GFR 58->60  Recommendations:Unable to reach.  Follow-up plan: ICM clinic phone appointment on4/06/2018 to recheck fluid levels.     Office appt 04/20/2018 with Dr. Domenic Polite.    Copy of ICM check sent to Dr. [name] and Dr Domenic Polite.   AT/AF   3 month ICM trend: 04/16/2018   1 Year ICM trend:       [name], RN 04/17/2018 6:34 PM

## 2018-04-17 NOTE — Telephone Encounter (Signed)
Remote ICM transmission received.  Attempted call to patient regarding ICM remote transmission and no answer.  

## 2018-04-19 NOTE — Progress Notes (Signed)
Cardiology Office Note  Date: 04/20/2018   ID: Melissa Paul, DOB 02-21-1945, MRN [id number]  PCP: Sharion Balloon, FNP  Primary Cardiologist: Rozann Lesches, MD   Chief Complaint  Patient presents with  . Cardiomyopathy    History of Present Illness: Melissa Paul is a 73 y.o. female last seen in August 2019.  She presents for a follow-up visit (she requested to be seen in the office rather than a telehealth encounter).  She states that she has felt abdominal bloating in the last few weeks on baseline chronic leg and knee swelling.  She has been more short of breath in this setting.  No fevers or chills, no cough.  She does report a recent UTI which was treated with antibiotics.  She reports compliance with her medications, has been taking Lasix 80 mg daily.  Recent thoracic impedance per device check indicated fluctuating retention of fluid.  By chart review her weight is 180 pounds and stable since March 11.  It is lower by her home scales, generally in the mid 170s (174-178).  She sees Dr. [name] in the device clinic, [place] biventricular ICD in place.  Recent device interrogation indicated 96% biventricular pacing.    Follow-up echocardiogram from October 2019 revealed LVEF 35% range with grade 1 diastolic dysfunction.  Medical therapy for cardiomyopathy has been somewhat limited due to her low normal blood pressure.  I reviewed her medications below which are stable from a cardiac perspective.  Past Medical History:  Diagnosis Date  . Chronic systolic heart failure ([place])   . Glucose intolerance (pre-diabetes)   . Hyperlipemia   . Hypothyroidism   . LBBB (left bundle branch block)   . Mitral regurgitation    Mild to Moderate  . Nonischemic cardiomyopathy ([place])    LVEF 35-40% by echo 8/13  . Paroxysmal atrial fibrillation ([facility])    Brief episodes by device interrogation  . UTI (urinary tract infection)    Recurrent  . Ventricular tachycardia ([place])    ICD  therapy for VT    Past Surgical History:  Procedure Laterality Date  . APPENDECTOMY    . BIV ICD GENERATOR CHANGEOUT N/A 07/11/2016   Procedure: BiV ICD Generator Changeout;  Surgeon: Thompson Grayer, MD;  Location: [facility];  Service: Cardiovascular;  Laterality: N/A;  . CARDIAC DEFIBRILLATOR PLACEMENT  09/2009   St. Jude BiV ICD by Dr. Sandria Manly class III  . CHOLECYSTECTOMY    . has one ovary left    . KNEE ARTHROSCOPY     Left  . LEFT AND RIGHT HEART CATHETERIZATION WITH CORONARY ANGIOGRAM N/A 04/12/2014   Procedure: LEFT AND RIGHT HEART CATHETERIZATION WITH CORONARY ANGIOGRAM;  Surgeon: Leonie Man, MD;  Location: [facility] CATH LAB;  Service: Cardiovascular;  Laterality: N/A;  . Left breast biopsy x 2     no maliganancy  . TOTAL ABDOMINAL HYSTERECTOMY     (R) ovary removed    Current Outpatient Medications  Medication Sig Dispense Refill  . albuterol (PROVENTIL) (2.5 MG/3ML) 0.083% nebulizer solution Take 3 mLs (2.5 mg total) by nebulization every 6 (six) hours as needed for wheezing or shortness of breath. 150 mL 1  . aspirin EC 81 MG tablet Take 81 mg by mouth daily.    . carvedilol (COREG) 12.5 MG tablet TAKE 1 TABLET BY MOUTH TWICE DAILY 180 tablet 1  . estradiol (ESTRACE) 0.1 MG/GM vaginal cream Place 1 Applicatorful vaginally at bedtime.    . furosemide (LASIX) 80  MG tablet Take 80 mg by mouth.    . levothyroxine (SYNTHROID, LEVOTHROID) 75 MCG tablet Take 1 tablet (75 mcg total) by mouth daily. 90 tablet 1  . metFORMIN (GLUCOPHAGE) 500 MG tablet Take 1 tablet by mouth twice daily 180 tablet 0  . spironolactone (ALDACTONE) 25 MG tablet Take 1 tablet by mouth once daily 90 tablet 0   No current facility-administered medications for this visit.    Allergies:  Bactrim; Cephalexin; Chlorhexidine gluconate; Clindamycin; Contrast media [iodinated diagnostic agents]; Doxycycline; Latex; Levofloxacin; Nitrofurantoin monohyd macro; and Penicillins   Social History: The  patient  reports that she quit smoking about 22 years ago. Her smoking use included cigarettes. She started smoking about 68 years ago. She has a 6.00 pack-year smoking history. She has never used smokeless tobacco. She reports that she does not drink alcohol or use drugs.   ROS:  Please see the history of present illness. Otherwise, complete review of systems is positive for fatigue.  All other systems are reviewed and negative.   Physical Exam: VS:  BP 114/74   Pulse 70   Temp 97.8 F (36.6 C)   Ht 5' 2.5" (1.588 m)   Wt 180 lb 12.8 oz (82 kg)   SpO2 98%   BMI 32.54 kg/m , BMI Body mass index is 32.54 kg/m.  Wt Readings from Last 3 Encounters:  04/20/18 180 lb 12.8 oz (82 kg)  04/01/18 180 lb (81.6 kg)  11/28/17 192 lb 3.2 oz (87.2 kg)    General: Patient appears comfortable at rest.  Wearing a facemask. HEENT: Conjunctiva and lids normal, oropharynx clear. Neck: Supple, elevated JVP, no carotid bruits. Lungs: No crackles, nonlabored breathing at rest. Cardiac: Regular rate and rhythm, no S3 or significant systolic murmur. Abdomen: Protuberant, bowel sounds present, no guarding or rebound. Extremities: Chronic appearing lower leg edema and adipose tissue, distal pulses 1-2+. Skin: Warm and dry. Musculoskeletal: No kyphosis. Neuropsychiatric: Alert and oriented x3, affect grossly appropriate.  ECG: I personally reviewed the tracing from 02/27/2017 which showed a dual-chamber paced rhythm.  Recent Labwork: 11/12/2017: ALT 7; AST 13; BUN 12; Creatinine, Ser 0.84; Hemoglobin 13.2; Platelets 316; Potassium 3.9; Sodium 140; TSH 5.420     Component Value Date/Time   CHOL 177 08/11/2017 1019   TRIG 108 08/11/2017 1019   HDL 50 08/11/2017 1019   CHOLHDL 3.5 08/11/2017 1019   CHOLHDL 3.3 04/11/2007 0600   VLDL 10 04/11/2007 0600   LDLCALC 105 (H) 08/11/2017 1019    Other Studies Reviewed Today:  Echocardiogram 10/29/2017: Study Conclusions  - Left ventricle: The cavity size  was at the upper limits of   normal. Wall thickness was normal. Systolic function was   moderately to severely reduced. The estimated ejection fraction   was 35%. Diffuse hypokinesis. Doppler parameters are consistent   with abnormal left ventricular relaxation (grade 1 diastolic   dysfunction). Doppler parameters are consistent with   indeterminate ventricular filling pressure. - Ventricular septum: Septal motion showed abnormal function and   dyssynergy. - Mitral valve: There was mild regurgitation. - Right ventricle: Pacer wire or catheter noted in right ventricle.   Systolic function was moderately reduced. - Atrial septum: No defect or patent foramen ovale was identified. - Tricuspid valve: There was mild regurgitation.  Assessment and Plan:  1.  Heart failure with LVEF approximately 35% by assessment in October 2019.  She has had interval fluctuation in fluid status, evidence of fluid overload at this time.  We will increase Lasix  to 80 mg in the morning and 40 mg in the afternoon for now, at least over the period of the next few days, she will continue to check weight at home and monitor symptoms.  I did talk with her about the possibility of switching to [facility] if needed.  Follow-up BMET for next clinical visit.  2.  Nonischemic cardiomyopathy, LVEF 35% by echocardiogram in October 2019.  Medical therapy has been limited by low normal to symptomatically low blood pressures.  She has been able to maintain Coreg and Aldactone along with Lasix.  Blood pressure is a little higher today.  We might be able to talk about a trial of very low-dose Entresto eventually, but will focus on volume status first.  3.  [employer] Jude biventricular ICD in place.  Continue to follow with Dr. [name].  Current medicines were reviewed with the patient today.   Orders Placed This Encounter  Procedures  . Basic metabolic panel    Disposition: Follow-up in 6 weeks.  Signed, Satira Sark, MD, [facility]  04/20/2018 10:32 AM    [facility] [place], [place], [place] [address] Phone: [phone]; Fax: [phone]

## 2018-04-20 ENCOUNTER — Encounter: Payer: Self-pay | Admitting: Cardiology

## 2018-04-20 ENCOUNTER — Other Ambulatory Visit: Payer: Self-pay | Admitting: Pediatrics

## 2018-04-20 ENCOUNTER — Other Ambulatory Visit: Payer: Self-pay

## 2018-04-20 ENCOUNTER — Ambulatory Visit (INDEPENDENT_AMBULATORY_CARE_PROVIDER_SITE_OTHER): Payer: Medicare Other | Admitting: Cardiology

## 2018-04-20 VITALS — BP 114/74 | HR 70 | Temp 97.8°F | Ht 62.5 in | Wt 180.8 lb

## 2018-04-20 DIAGNOSIS — I428 Other cardiomyopathies: Secondary | ICD-10-CM

## 2018-04-20 DIAGNOSIS — I5022 Chronic systolic (congestive) heart failure: Secondary | ICD-10-CM

## 2018-04-20 DIAGNOSIS — Z95810 Presence of automatic (implantable) cardiac defibrillator: Secondary | ICD-10-CM | POA: Diagnosis not present

## 2018-04-20 NOTE — Patient Instructions (Addendum)
Medication Instructions:   Your physician has recommended you make the following change in your medication:   Please take an extra 40 mg lasix daily with your 80 mg for several days this week as needed, then resume previous dose.  Labwork:  Your physician recommends that you return for lab work in: 6-8 weeks just before your next visit to check your BMET  Testing/Procedures:  NONE  Follow-Up:  Your physician recommends that you schedule a follow-up appointment in: 6-8 weeks.  Any Other Special Instructions Will Be Listed Below (If Applicable).  If you need a refill on your cardiac medications before your next appointment, please call your pharmacy.

## 2018-04-20 NOTE — Progress Notes (Signed)
Received: 2 days ago  Message Contents  Thompson Grayer, MD  Short, Laurie Panda, RN; Lorenda Hatchet A        AF burden has significantly increased since I saw last.   Lenna Sciara, please call and offer teleheatlh visit with me.

## 2018-04-27 ENCOUNTER — Other Ambulatory Visit: Payer: Self-pay

## 2018-04-27 ENCOUNTER — Ambulatory Visit (INDEPENDENT_AMBULATORY_CARE_PROVIDER_SITE_OTHER): Payer: Medicare Other

## 2018-04-27 DIAGNOSIS — I5022 Chronic systolic (congestive) heart failure: Secondary | ICD-10-CM

## 2018-04-27 DIAGNOSIS — Z95810 Presence of automatic (implantable) cardiac defibrillator: Secondary | ICD-10-CM

## 2018-04-29 ENCOUNTER — Telehealth: Payer: Self-pay

## 2018-04-29 NOTE — Progress Notes (Signed)
EPIC Encounter for ICM Monitoring  Patient Name: Melissa Paul is a 73 y.o. female Date: 04/29/2018 Primary Care Physican: Sharion Balloon, FNP Primary Cardiologist:[facility] Electrophysiologist: Allred Bi-V Pacing: 96% LastWeight: 174 lbs 04/08/2018 Weight: 178lbs 04/29/2018 Weight:  unknown  AT/AF Burden: 1.1%  Attempted call to patient and unable to reach.   Transmission reviewed.   Dr Domenic Polite instructed patient at 3/30 OV to increase Furosemide to 80 mg every morning and 40 mg in afternoon for several days.  Thoracic impedancenormalbut was abnormal 4/2-4/5 suggesting fluid accumulation after 3/3 office visit with Dr Domenic Polite.  Prescribed:Furosemide 80 mgdaily.  Labs: 11/12/2017 Creatinine 0.84, BUN 12, Potassium 3.9, Sodium 140, GFR 69-79 08/06/2017 Creatinine 1.01, BUN 12, Potassium 4.2, Sodium 140, GFR 55-63 02/27/2017 Creatinine 0.95, BUN 10, Potassium 3.8, Sodium 138, GFR 58->60  Recommendations: Unable to reach.  Follow-up plan: ICM clinic phone appointment on4/27/2020.  Copy of ICM check sent to Dr.Allred and Dr Domenic Polite (to show Lasix response after taking extra for a few days that was recommended at 3/30 OV).  3 month ICM trend: 04/27/2018    1 Year ICM trend:       [name], RN 04/29/2018 9:32 AM

## 2018-04-29 NOTE — Telephone Encounter (Signed)
Remote ICM transmission received.  Attempted call to patient regarding ICM remote transmission and left detailed message, per DPR, with next ICM remote transmission date of 05/18/2018.  Advised to return call for any fluid symptoms or questions.    

## 2018-05-07 ENCOUNTER — Encounter: Payer: Self-pay | Admitting: Family

## 2018-05-07 ENCOUNTER — Ambulatory Visit (INDEPENDENT_AMBULATORY_CARE_PROVIDER_SITE_OTHER): Payer: Medicare Other | Admitting: Family

## 2018-05-07 ENCOUNTER — Other Ambulatory Visit: Payer: Self-pay

## 2018-05-07 VITALS — BP 101/59 | HR 70 | Temp 97.3°F | Ht 62.5 in | Wt 183.6 lb

## 2018-05-07 DIAGNOSIS — I428 Other cardiomyopathies: Secondary | ICD-10-CM | POA: Diagnosis not present

## 2018-05-07 DIAGNOSIS — E119 Type 2 diabetes mellitus without complications: Secondary | ICD-10-CM | POA: Diagnosis not present

## 2018-05-07 DIAGNOSIS — I5022 Chronic systolic (congestive) heart failure: Secondary | ICD-10-CM

## 2018-05-07 DIAGNOSIS — K5900 Constipation, unspecified: Secondary | ICD-10-CM

## 2018-05-07 DIAGNOSIS — E039 Hypothyroidism, unspecified: Secondary | ICD-10-CM

## 2018-05-07 DIAGNOSIS — I5023 Acute on chronic systolic (congestive) heart failure: Secondary | ICD-10-CM | POA: Diagnosis not present

## 2018-05-07 DIAGNOSIS — I480 Paroxysmal atrial fibrillation: Secondary | ICD-10-CM | POA: Diagnosis not present

## 2018-05-07 DIAGNOSIS — Z1159 Encounter for screening for other viral diseases: Secondary | ICD-10-CM

## 2018-05-07 LAB — BAYER DCA HB A1C WAIVED: HB A1C (BAYER DCA - WAIVED): 6.1 % (ref <7.0)

## 2018-05-07 NOTE — Progress Notes (Signed)
Subjective:    Patient ID: Melissa Paul, female    DOB: May 07, 1945, 73 y.o.   MRN: [id number]  Chief Complaint  Patient presents with  . Establish Care  . thyroid recheck   PT presents to the office today for chronic follow up. She is followed by her Cardiologists every 3 months for CHF, Cardiomyopathy, and A Fib. She has a pacemaker.  Diabetes  She presents for her follow-up diabetic visit. She has type 2 diabetes mellitus. Her disease course has been stable. There are no hypoglycemic associated symptoms. Pertinent negatives for diabetes include no blurred vision, no fatigue, no foot paresthesias and no visual change. Symptoms are stable. Diabetic complications include heart disease. Pertinent negatives for diabetic complications include no nephropathy or peripheral neuropathy. Risk factors for coronary artery disease include dyslipidemia, diabetes mellitus, hypertension, sedentary lifestyle and post-menopausal. She is following a generally healthy diet. Her bedtime blood glucose range is generally 90-110 mg/dl. Eye exam is not current.  Thyroid Problem  Presents for follow-up visit. Symptoms include constipation. Patient reports no diarrhea, fatigue, hoarse voice or visual change. The symptoms have been stable.  Constipation  This is a chronic problem. The current episode started more than 1 year ago. The problem has been waxing and waning since onset. Her stool frequency is 2 to 3 times per week. Pertinent negatives include no diarrhea. She has tried diet changes for the symptoms. The treatment provided mild relief.  Congestive Heart Failure  Presents for follow-up visit. Pertinent negatives include no chest pressure, claudication or fatigue. The symptoms have been stable.      Review of Systems  Constitutional: Negative for fatigue.  HENT: Negative for hoarse voice.   Eyes: Negative for blurred vision.  Cardiovascular: Negative for claudication.  Gastrointestinal: Positive for  constipation. Negative for diarrhea.  All other systems reviewed and are negative.      Objective:   Physical Exam Vitals signs reviewed.  Constitutional:      General: She is not in acute distress.    Appearance: She is well-developed.  HENT:     Head: Normocephalic and atraumatic.     Right Ear: Tympanic membrane normal.     Left Ear: Tympanic membrane normal.  Eyes:     Pupils: Pupils are equal, round, and reactive to light.  Neck:     Musculoskeletal: Normal range of motion and neck supple.     Thyroid: No thyromegaly.  Cardiovascular:     Rate and Rhythm: Normal rate and regular rhythm.     Heart sounds: Normal heart sounds. No murmur.  Pulmonary:     Effort: Pulmonary effort is normal. No respiratory distress.     Breath sounds: Normal breath sounds. No wheezing.  Abdominal:     General: Bowel sounds are normal. There is no distension.     Palpations: Abdomen is soft.     Tenderness: There is no abdominal tenderness.  Musculoskeletal: Normal range of motion.        General: No tenderness.     Right lower leg: Edema (trace) present.     Left lower leg: Edema (trace) present.  Skin:    General: Skin is warm and dry.  Neurological:     Mental Status: She is alert and oriented to person, place, and time.     Cranial Nerves: No cranial nerve deficit.     Deep Tendon Reflexes: Reflexes are normal and symmetric.  Psychiatric:        Behavior: Behavior normal.  Thought Content: Thought content normal.        Judgment: Judgment normal.       BP (!) 101/59   Pulse 70   Temp (!) 97.3 F (36.3 C) (Oral)   Ht 5' 2.5" (1.588 m)   Wt 183 lb 9.6 oz (83.3 kg)   BMI 33.05 kg/m      Assessment & Plan:  Melissa Paul comes in today with chief complaint of Establish Care and thyroid recheck   Diagnosis and orders addressed:  1. Diabetes mellitus without complication ([place]) - Bayer DCA Hb A1c Waived - CMP14+EGFR - CBC with Differential/Platelet -  Microalbumin / creatinine urine ratio  2. Paroxysmal atrial fibrillation (HCC) - CMP14+EGFR - CBC with Differential/Platelet  3. Nonischemic cardiomyopathy ([facility]) - CMP14+EGFR - CBC with Differential/Platelet  4. Acute on chronic systolic ACC/AHA stage C congestive heart failure ([facility]) - CMP14+EGFR - CBC with Differential/Platelet  5. Chronic systolic heart failure ([facility]) - CMP14+EGFR - CBC with Differential/Platelet  6. Constipation, unspecified constipation type - CMP14+EGFR - CBC with Differential/Platelet  7. Hypothyroidism, unspecified type - CMP14+EGFR - CBC with Differential/Platelet - TSH  8. Need for hepatitis C screening test - CMP14+EGFR - CBC with Differential/Platelet - Hepatitis C antibody   Labs pending Health Maintenance reviewed Diet and exercise encouraged  Follow up plan: 6 months    Evelina Dun, FNP

## 2018-05-07 NOTE — Patient Instructions (Signed)
Health Maintenance After Age 75 After age 75, you are at a higher risk for certain long-term diseases and infections as well as injuries from falls. Falls are a major cause of broken bones and head injuries in people who are older than age 75. Getting regular preventive care can help to keep you healthy and well. Preventive care includes getting regular testing and making lifestyle changes as recommended by your health care provider. Talk with your health care provider about:  Which screenings and tests you should have. A screening is a test that checks for a disease when you have no symptoms.  A diet and exercise plan that is right for you. What should I know about screenings and tests to prevent falls? Screening and testing are the best ways to find a health problem early. Early diagnosis and treatment give you the best chance of managing medical conditions that are common after age 75. Certain conditions and lifestyle choices may make you more likely to have a fall. Your health care provider may recommend:  Regular vision checks. Poor vision and conditions such as cataracts can make you more likely to have a fall. If you wear glasses, make sure to get your prescription updated if your vision changes.  Medicine review. Work with your health care provider to regularly review all of the medicines you are taking, including over-the-counter medicines. Ask your health care provider about any side effects that may make you more likely to have a fall. Tell your health care provider if any medicines that you take make you feel dizzy or sleepy.  Osteoporosis screening. Osteoporosis is a condition that causes the bones to get weaker. This can make the bones weak and cause them to break more easily.  Blood pressure screening. Blood pressure changes and medicines to control blood pressure can make you feel dizzy.  Strength and balance checks. Your health care provider may recommend certain tests to check your  strength and balance while standing, walking, or changing positions.  Foot health exam. Foot pain and numbness, as well as not wearing proper footwear, can make you more likely to have a fall.  Depression screening. You may be more likely to have a fall if you have a fear of falling, feel emotionally low, or feel unable to do activities that you used to do.  Alcohol use screening. Using too much alcohol can affect your balance and may make you more likely to have a fall. What actions can I take to lower my risk of falls? General instructions  Talk with your health care provider about your risks for falling. Tell your health care provider if: ? You fall. Be sure to tell your health care provider about all falls, even ones that seem minor. ? You feel dizzy, sleepy, or off-balance.  Take over-the-counter and prescription medicines only as told by your health care provider. These include any supplements.  Eat a healthy diet and maintain a healthy weight. A healthy diet includes low-fat dairy products, low-fat (lean) meats, and fiber from whole grains, beans, and lots of fruits and vegetables. Home safety  Remove any tripping hazards, such as rugs, cords, and clutter.  Install safety equipment such as grab bars in bathrooms and safety rails on stairs.  Keep rooms and walkways well-lit. Activity   Follow a regular exercise program to stay fit. This will help you maintain your balance. Ask your health care provider what types of exercise are appropriate for you.  If you need a cane or   walker, use it as recommended by your health care provider.  Wear supportive shoes that have nonskid soles. Lifestyle  Do not drink alcohol if your health care provider tells you not to drink.  If you drink alcohol, limit how much you have: ? 0-1 drink a day for women. ? 0-2 drinks a day for men.  Be aware of how much alcohol is in your drink. In the [place] one drink equals one typical bottle of beer (12  oz), one-half glass of wine (5 oz), or one shot of hard liquor (1 oz).  Do not use any products that contain nicotine or tobacco, such as cigarettes and e-cigarettes. If you need help quitting, ask your health care provider. Summary  Having a healthy lifestyle and getting preventive care can help to protect your health and wellness after age 75.  Screening and testing are the best way to find a health problem early and help you avoid having a fall. Early diagnosis and treatment give you the best chance for managing medical conditions that are more common for people who are older than age 75.  Falls are a major cause of broken bones and head injuries in people who are older than age 75. Take precautions to prevent a fall at home.  Work with your health care provider to learn what changes you can make to improve your health and wellness and to prevent falls. This information is not intended to replace advice given to you by your health care provider. Make sure you discuss any questions you have with your health care provider. Document Released: 11/20/2016 Document Revised: 11/20/2016 Document Reviewed: 11/20/2016 Elsevier Interactive Patient Education  2019 [employer] Inc[employer]  

## 2018-05-08 LAB — CMP14+EGFR
ALT: 6 IU/L (ref 0–32)
AST: 9 IU/L (ref 0–40)
Albumin/Globulin Ratio: 1.8 (ref 1.2–2.2)
Albumin: 4.2 g/dL (ref 3.7–4.7)
Alkaline Phosphatase: 81 IU/L (ref 39–117)
BUN/Creatinine Ratio: 16 (ref 12–28)
BUN: 13 mg/dL (ref 8–27)
Bilirubin Total: 0.3 mg/dL (ref 0.0–1.2)
CO2: 26 mmol/L (ref 20–29)
Calcium: 9.4 mg/dL (ref 8.7–10.3)
Chloride: 98 mmol/L (ref 96–106)
Creatinine, Ser: 0.83 mg/dL (ref 0.57–1.00)
GFR calc Af Amer: 80 mL/min/{1.73_m2} (ref >59)
GFR calc non Af Amer: 70 mL/min/{1.73_m2} (ref >59)
Globulin, Total: 2.3 g/dL (ref 1.5–4.5)
Glucose: 110 mg/dL — ABNORMAL HIGH (ref 65–99)
Potassium: 4.1 mmol/L (ref 3.5–5.2)
Sodium: 141 mmol/L (ref 134–144)
Total Protein: 6.5 g/dL (ref 6.0–8.5)

## 2018-05-08 LAB — CBC WITH DIFFERENTIAL/PLATELET
Basophils Absolute: 0 10*3/uL (ref 0.0–0.2)
Basos: 0 %
EOS (ABSOLUTE): 0.1 10*3/uL (ref 0.0–0.4)
Eos: 1 %
Hematocrit: 37.2 % (ref 34.0–46.6)
Hemoglobin: 13 g/dL (ref 11.1–15.9)
Immature Grans (Abs): 0 10*3/uL (ref 0.0–0.1)
Immature Granulocytes: 0 %
Lymphocytes Absolute: 2.7 10*3/uL (ref 0.7–3.1)
Lymphs: 36 %
MCH: 30 pg (ref 26.6–33.0)
MCHC: 34.9 g/dL (ref 31.5–35.7)
MCV: 86 fL (ref 79–97)
Monocytes Absolute: 0.5 10*3/uL (ref 0.1–0.9)
Monocytes: 7 %
Neutrophils Absolute: 4.1 10*3/uL (ref 1.4–7.0)
Neutrophils: 56 %
Platelets: 277 10*3/uL (ref 150–450)
RBC: 4.34 x10E6/uL (ref 3.77–5.28)
RDW: 13.7 % (ref 11.7–15.4)
WBC: 7.4 10*3/uL (ref 3.4–10.8)

## 2018-05-08 LAB — HEPATITIS C ANTIBODY: Hep C Virus Ab: <0.1 s/co ratio (ref 0.0–0.9)

## 2018-05-08 LAB — MICROALBUMIN / CREATININE URINE RATIO
Creatinine, Urine: 42 mg/dL
Microalb/Creat Ratio: <7 mg/g creat (ref 0–29)
Microalbumin, Urine: <3 ug/mL

## 2018-05-08 LAB — TSH: TSH: 0.084 u[IU]/mL — ABNORMAL LOW (ref 0.450–4.500)

## 2018-05-18 ENCOUNTER — Other Ambulatory Visit: Payer: Self-pay

## 2018-05-18 ENCOUNTER — Ambulatory Visit (INDEPENDENT_AMBULATORY_CARE_PROVIDER_SITE_OTHER): Payer: Medicare Other

## 2018-05-18 ENCOUNTER — Telehealth: Payer: Self-pay

## 2018-05-18 DIAGNOSIS — I5022 Chronic systolic (congestive) heart failure: Secondary | ICD-10-CM

## 2018-05-18 DIAGNOSIS — Z95810 Presence of automatic (implantable) cardiac defibrillator: Secondary | ICD-10-CM

## 2018-05-18 NOTE — Telephone Encounter (Signed)
Unable to speak  with patient to remind of missed remote transmission 

## 2018-05-20 ENCOUNTER — Telehealth: Payer: Self-pay

## 2018-05-20 NOTE — Telephone Encounter (Signed)
Remote ICM transmission received.  Attempted call to patient regarding ICM remote transmission and no answer.  

## 2018-05-20 NOTE — Progress Notes (Signed)
EPIC Encounter for ICM Monitoring  Patient Name: Melissa Paul is a 73 y.o. female Date: 05/20/2018 Primary Care Physican: Sharion Balloon, FNP Primary Cardiologist:[facility] Electrophysiologist: Allred Bi-V Pacing: 96% LastWeight: 174 lbs 04/08/2018 Weight: 178lbs 05/20/2018 Weight: unknown  AT/AF Burden: 1.2% (increased from 1.1%)  Attempted call to patient and unable to reach. Transmission reviewed.    [facility] Thoracic impedance decreased suggestive of fluid accumulation starting 05/18/2018.   Prescribed:Furosemide 80 mgdaily.  Labs: 11/12/2017 Creatinine 0.84, BUN 12, Potassium 3.9, Sodium 140, GFR 69-79 08/06/2017 Creatinine 1.01, BUN 12, Potassium 4.2, Sodium 140, GFR 55-63 02/27/2017 Creatinine 0.95, BUN 10, Potassium 3.8, Sodium 138, GFR 58->60  Recommendations: Unable to reach.  Follow-up plan: ICM clinic phone appointment on5/07/2018 to recheck fluid levels.  Copy of ICM check sent to Dr.Allred and Dr Domenic Polite for review and recommendations if needed  3 month ICM trend: 05/18/2018    AT/AF   1 Year ICM trend:       [name], RN 05/20/2018 10:11 AM

## 2018-05-22 ENCOUNTER — Other Ambulatory Visit: Payer: Self-pay | Admitting: Pediatrics

## 2018-05-22 ENCOUNTER — Other Ambulatory Visit: Payer: Self-pay | Admitting: Family

## 2018-05-22 ENCOUNTER — Other Ambulatory Visit: Payer: Self-pay | Admitting: Family Medicine

## 2018-05-22 ENCOUNTER — Other Ambulatory Visit: Payer: Self-pay | Admitting: Cardiology

## 2018-05-22 DIAGNOSIS — E039 Hypothyroidism, unspecified: Secondary | ICD-10-CM

## 2018-05-26 ENCOUNTER — Other Ambulatory Visit: Payer: Self-pay

## 2018-05-26 ENCOUNTER — Ambulatory Visit (INDEPENDENT_AMBULATORY_CARE_PROVIDER_SITE_OTHER): Payer: Medicare Other | Admitting: Physician Assistant

## 2018-05-26 DIAGNOSIS — J209 Acute bronchitis, unspecified: Secondary | ICD-10-CM

## 2018-05-26 MED ORDER — AZITHROMYCIN 250 MG PO TABS: ORAL_TABLET | ORAL | 0 refills | Status: DC

## 2018-05-26 MED ORDER — PREDNISONE 10 MG (21) PO TBPK: ORAL_TABLET | ORAL | 0 refills | Status: DC

## 2018-05-27 ENCOUNTER — Encounter: Payer: Self-pay | Admitting: Physician Assistant

## 2018-05-27 NOTE — Progress Notes (Signed)
Telephone visit  Subjective: AY:[id number] PCP: Sharion Balloon, FNP FUX:NATFTDD Melissa Paul is a 73 y.o. female calls for telephone consult today. Patient provides verbal consent for consult held via phone.  Patient is identified with 2 separate identifiers.  At this time the entire area is on COVID-19 social distancing and stay home orders are in place.  Patient is of higher risk and therefore we are performing this by a virtual method.  Location of patient: home Location of provider: [facility] Others present for call: no  Patient with several days of progressing upper respiratory and bronchial symptoms. Initially there was more upper respiratory congestion. This progressed to having significant cough that is productive throughout the day and severe at night. There is occasional wheezing after coughing. Sometimes there is slight dyspnea on exertion. It is productive mucus that is yellow in color. Denies any blood.    ROS: Per HPI  Allergies  Allergen Reactions  . Bactrim Rash  . Cephalexin Rash    Says she is able to take cefuroxime  . Chlorhexidine Gluconate     Hives, itching after using wipes with last procedure  . Clindamycin Rash       . Contrast Media [Iodinated Diagnostic Agents] Rash  . Doxycycline Rash  . Latex Rash       . Levofloxacin Rash       . Nitrofurantoin Monohyd Macro Rash       . Penicillins Swelling and Rash    Has patient had a PCN reaction causing immediate rash, facial/tongue/throat swelling, SOB or lightheadedness with hypotension: Yes Has patient had a PCN reaction causing severe rash involving mucus membranes or skin necrosis: Yes Has patient had a PCN reaction that required hospitalization: No Has patient had a PCN reaction occurring within the last 10 years: No Rash/swelled tongue If all of the above answers are "NO", then may proceed with Cephalosporin use.    Past Medical History:  Diagnosis Date  . Chronic systolic heart failure ([place])    . Glucose intolerance (pre-diabetes)   . Hyperlipemia   . Hypothyroidism   . LBBB (left bundle branch block)   . Mitral regurgitation    Mild to Moderate  . Nonischemic cardiomyopathy ([place])    LVEF 35-40% by echo 8/13  . Paroxysmal atrial fibrillation ([facility])    Brief episodes by device interrogation  . UTI (urinary tract infection)    Recurrent  . Ventricular tachycardia ([place])    ICD therapy for VT    Current Outpatient Medications:  .  albuterol (PROVENTIL) (2.5 MG/3ML) 0.083% nebulizer solution, Take 3 mLs (2.5 mg total) by nebulization every 6 (six) hours as needed for wheezing or shortness of breath., Disp: 150 mL, Rfl: 1 .  aspirin EC 81 MG tablet, Take 81 mg by mouth daily., Disp: , Rfl:  .  azithromycin (ZITHROMAX Z-PAK) 250 MG tablet, Take as directed, Disp: 6 each, Rfl: 0 .  carvedilol (COREG) 12.5 MG tablet, Take 1 tablet by mouth twice daily, Disp: 180 tablet, Rfl: 0 .  estradiol (ESTRACE) 0.1 MG/GM vaginal cream, Place 1 Applicatorful vaginally at bedtime., Disp: , Rfl:  .  EUTHYROX 75 MCG tablet, Take 1 tablet by mouth once daily, Disp: 90 tablet, Rfl: 0 .  furosemide (LASIX) 80 MG tablet, Take 1 tablet (80 mg total) by mouth daily., Disp: 90 tablet, Rfl: 3 .  metFORMIN (GLUCOPHAGE) 500 MG tablet, Take 1 tablet by mouth twice daily, Disp: 180 tablet, Rfl: 0 .  predniSONE (STERAPRED  UNI-PAK 21 TAB) 10 MG (21) TBPK tablet, As directed x 6 days, Disp: 21 tablet, Rfl: 0 .  spironolactone (ALDACTONE) 25 MG tablet, Take 1 tablet by mouth once daily, Disp: 90 tablet, Rfl: 1  Assessment/ Plan: 73 y.o. female   1. Bronchitis, acute, with bronchospasm - predniSONE (STERAPRED UNI-PAK 21 TAB) 10 MG (21) TBPK tablet; As directed x 6 days  Dispense: 21 tablet; Refill: 0 - azithromycin (ZITHROMAX Z-PAK) 250 MG tablet; Take as directed  Dispense: 6 each; Refill: 0   Start time: 4:22 PM End time: 4:32 PM  Meds ordered this encounter  Medications  . predniSONE (STERAPRED UNI-PAK  21 TAB) 10 MG (21) TBPK tablet    Sig: As directed x 6 days    Dispense:  21 tablet    Refill:  0    Order Specific Question:   Supervising Provider    Answer:   Janora Norlander [[id number]]  . azithromycin (ZITHROMAX Z-PAK) 250 MG tablet    Sig: Take as directed    Dispense:  6 each    Refill:  0    Order Specific Question:   Supervising Provider    Answer:   Janora Norlander [[id number]]    Particia Nearing PA-C [place] [phone]

## 2018-05-28 ENCOUNTER — Other Ambulatory Visit: Payer: Self-pay

## 2018-05-29 ENCOUNTER — Telehealth: Payer: Self-pay

## 2018-05-29 NOTE — Telephone Encounter (Signed)
Unable to speak  with patient to remind of missed remote transmission 

## 2018-06-01 ENCOUNTER — Other Ambulatory Visit: Payer: Self-pay

## 2018-06-01 ENCOUNTER — Ambulatory Visit (INDEPENDENT_AMBULATORY_CARE_PROVIDER_SITE_OTHER): Payer: Medicare Other

## 2018-06-01 DIAGNOSIS — Z95810 Presence of automatic (implantable) cardiac defibrillator: Secondary | ICD-10-CM

## 2018-06-01 DIAGNOSIS — I5022 Chronic systolic (congestive) heart failure: Secondary | ICD-10-CM

## 2018-06-03 ENCOUNTER — Telehealth: Payer: Self-pay | Admitting: *Deleted

## 2018-06-03 NOTE — Progress Notes (Signed)
EPIC Encounter for ICM Monitoring  Patient Name: Melissa Paul is a 73 y.o. female Date: 06/03/2018 Primary Care Physican: Sharion Balloon, FNP Primary Cardiologist:[facility] Electrophysiologist: Allred Bi-V Pacing: 96% BaselineWeight: 172-174 lbs 05/20/2018 Weight:184.4 06/02/2018 Weight: 187.7 lbs  AT/AF Burden: 1.3% (increased from 1.1%)  Transmission reviewed. Spoke with patient.  She reports her weight increased from 172 lb (2 weeks ago) to 187.7 lbs on 06/02/2018.  Reports significant swelling of lower extremities, hands and abdominal bloating.    CorVue Thoracic impedance decreased suggestive of fluid accumulation starting 05/18/2018 - 05/25/2018 and did improve but appears to be below baseline normal again.   Prescribed:Furosemide 80 mgdaily.  Pt has been taking extra 40 mg of Furosemide every other day for past 10 days but does not seem to be making her urinate more.    Labs: 11/12/2017 Creatinine 0.84, BUN 12, Potassium 3.9, Sodium 140, GFR 69-79 08/06/2017 Creatinine 1.01, BUN 12, Potassium 4.2, Sodium 140, GFR 55-63 02/27/2017 Creatinine 0.95, BUN 10, Potassium 3.8, Sodium 138, GFR 58->60  Recommendations: Patient has telehealth visit with Dr Domenic Polite on 5/15 and will discuss symptoms along with she does not think the Furosemide is working very well.    Follow-up plan: ICM clinic phone appointment on5/21/2020 to recheck fluid levels.  Copy of ICM check sent to Dr.Allred and Dr Domenic Polite.   3 month ICM trend: 05/31/2018    1 Year ICM trend:       [name], RN 06/03/2018 5:18 PM

## 2018-06-03 NOTE — Telephone Encounter (Signed)
-----   Message from Satira Sark, MD sent at 06/03/2018  2:24 PM EDT ----- Results reviewed.  Renal function remains normal range.  Will discuss symptoms and diuretic regimen at follow-up.

## 2018-06-03 NOTE — Telephone Encounter (Signed)
Patient informed. Copy sent to PCP °

## 2018-06-04 NOTE — Progress Notes (Signed)
Virtual Visit via Telephone Note   This visit type was conducted due to national recommendations for restrictions regarding the COVID-19 Pandemic (e.g. social distancing) in an effort to limit this patient's exposure and mitigate transmission in our community.  Due to her co-morbid illnesses, this patient is at least at moderate risk for complications without adequate follow up.  This format is felt to be most appropriate for this patient at this time.  The patient did not have access to video technology/had technical difficulties with video requiring transitioning to audio format only (telephone).  All issues noted in this document were discussed and addressed.  No physical exam could be performed with this format.  Please refer to the patient's chart for her  consent to telehealth for [facility].   Date:  06/05/2018   ID:  LINEA CALLES, DOB [birth year], MRN [id number]  Patient Location: Home Provider Location: Office  PCP:  Sharion Balloon, FNP  Cardiologist:  Rozann Lesches, MD Electrophysiologist:  Thompson Grayer, MD   Evaluation Performed:  Follow-Up Visit  Chief Complaint:   Cardiac follow-up  History of Present Illness:    GAO MITNICK is a 73 y.o. female last seen in March.  She did not have video access and we spoke by phone today.  We discussed her current medications and symptoms.  She reports weight gain and leg swelling over the last several weeks.  She sees Dr. [name] in the device clinic, [place] biventricular ICD in place.  Recent device interrogation indicated 1.3% AT/AF burden.  Recent thoracic impedance suggested fluid accumulation beginning in late April with some improvement.  We have discussed the possibility of switching from Lasix to Demadex.  I reviewed her recent lab work as outlined below.  Renal function has been stable.  The patient does not have symptoms concerning for COVID-19 infection (fever, chills, cough, or new shortness of breath).     Past Medical History:  Diagnosis Date  . Chronic systolic heart failure ([place])   . Glucose intolerance (pre-diabetes)   . Hyperlipemia   . Hypothyroidism   . LBBB (left bundle branch block)   . Mitral regurgitation    Mild to Moderate  . Nonischemic cardiomyopathy ([place])    LVEF 35-40% by echo 8/13  . Paroxysmal atrial fibrillation ([facility])    Brief episodes by device interrogation  . UTI (urinary tract infection)    Recurrent  . Ventricular tachycardia ([place])    ICD therapy for VT   Past Surgical History:  Procedure Laterality Date  . APPENDECTOMY    . BIV ICD GENERATOR CHANGEOUT N/A 07/11/2016   Procedure: BiV ICD Generator Changeout;  Surgeon: Thompson Grayer, MD;  Location: [facility];  Service: Cardiovascular;  Laterality: N/A;  . CARDIAC DEFIBRILLATOR PLACEMENT  09/2009   St. Jude BiV ICD by Dr. Sandria Manly class III  . CHOLECYSTECTOMY    . has one ovary left    . KNEE ARTHROSCOPY     Left  . LEFT AND RIGHT HEART CATHETERIZATION WITH CORONARY ANGIOGRAM N/A 04/12/2014   Procedure: LEFT AND RIGHT HEART CATHETERIZATION WITH CORONARY ANGIOGRAM;  Surgeon: Leonie Man, MD;  Location: [facility];  Service: Cardiovascular;  Laterality: N/A;  . Left breast biopsy x 2     no maliganancy  . TOTAL ABDOMINAL HYSTERECTOMY     (R) ovary removed     Current Meds  Medication Sig  . albuterol (PROVENTIL) (2.5 MG/3ML) 0.083% nebulizer solution Take 3 mLs (2.5 mg total)  by nebulization every 6 (six) hours as needed for wheezing or shortness of breath.  Marland Kitchen aspirin EC 81 MG tablet Take 81 mg by mouth daily.  . carvedilol (COREG) 12.5 MG tablet Take 1 tablet by mouth twice daily  . estradiol (ESTRACE) 0.1 MG/GM vaginal cream Place 1 Applicatorful vaginally at bedtime.  Arna Medici 75 MCG tablet Take 1 tablet by mouth once daily  . metFORMIN (GLUCOPHAGE) 500 MG tablet Take 1 tablet by mouth twice daily  . pantoprazole (PROTONIX) 40 MG tablet Take 1 tablet by mouth daily.  Marland Kitchen  spironolactone (ALDACTONE) 25 MG tablet Take 1 tablet by mouth once daily  . [DISCONTINUED] furosemide (LASIX) 80 MG tablet Take 1 tablet (80 mg total) by mouth daily. (Patient taking differently: Take 80 mg by mouth daily. Also take additional 40 mg daily prn swelling/weight gain)     Allergies:   Bactrim; Cephalexin; Chlorhexidine gluconate; Clindamycin; Contrast media [iodinated diagnostic agents]; Doxycycline; Latex; Levofloxacin; Nitrofurantoin monohyd macro; and Penicillins   Social History   Tobacco Use  . Smoking status: Former Smoker    Packs/day: 0.30    Years: 20.00    Pack years: 6.00    Types: Cigarettes    Start date: 08/03/1949    Last attempt to quit: 01/22/1996    Years since quitting: 22.3  . Smokeless tobacco: Never Used  Substance Use Topics  . Alcohol use: No    Alcohol/week: 0.0 standard drinks  . Drug use: No     Family Hx: The patient's family history includes CVA in her mother; Cancer in her father; Heart Problems in her mother; Heart disease in her maternal grandfather and maternal grandmother. There is no history of Diabetes, Hypertension, or Coronary artery disease.  ROS:   Please see the history of present illness.    All other systems reviewed and are negative.   Prior CV studies:   The following studies were reviewed today:  Echocardiogram 10/29/2017: Study Conclusions  - Left ventricle: The cavity size was at the upper limits of   normal. Wall thickness was normal. Systolic function was   moderately to severely reduced. The estimated ejection fraction   was 35%. Diffuse hypokinesis. Doppler parameters are consistent   with abnormal left ventricular relaxation (grade 1 diastolic   dysfunction). Doppler parameters are consistent with   indeterminate ventricular filling pressure. - Ventricular septum: Septal motion showed abnormal function and   dyssynergy. - Mitral valve: There was mild regurgitation. - Right ventricle: Pacer wire or catheter  noted in right ventricle.   Systolic function was moderately reduced. - Atrial septum: No defect or patent foramen ovale was identified. - Tricuspid valve: There was mild regurgitation.  Labs/Other Tests and Data Reviewed:    EKG:  An ECG dated 02/27/2017 was personally reviewed today and demonstrated:  Dual-chamber pacing.  Recent Labs: 05/07/2018: ALT 6; BUN 13; Creatinine, Ser 0.83; Hemoglobin 13.0; Platelets 277; Potassium 4.1; Sodium 141; TSH 0.084  May 2020: BUN 18, creatinine 0.92, sodium 133, potassium 3.4  Recent Lipid Panel Lab Results  Component Value Date/Time   CHOL 177 08/11/2017 10:19 AM   TRIG 108 08/11/2017 10:19 AM   HDL 50 08/11/2017 10:19 AM   CHOLHDL 3.5 08/11/2017 10:19 AM   CHOLHDL 3.3 04/11/2007 06:00 AM   LDLCALC 105 (H) 08/11/2017 10:19 AM    Wt Readings from Last 3 Encounters:  06/05/18 183 lb 1.6 oz (83.1 kg)  05/07/18 183 lb 9.6 oz (83.3 kg)  04/20/18 180 lb 12.8 oz (82  kg)     Objective:    Vital Signs:  BP 106/62   Pulse 80   Ht 5' 2.5" (1.588 m)   Wt 183 lb 1.6 oz (83.1 kg)   BMI 32.96 kg/m    Patient spoke in full sentences on the phone, no breathlessness. Voice tone and speech pattern were normal. No audible wheezing.  ASSESSMENT & PLAN:    1.  Chronic combined heart failure.  Patient reports weight gain and leg swelling and recent thoracic impedance was also consistent with relative fluid overload.  Based on our recorded weights there has not been a substantial change however.  Recent lab work shows stable creatinine.  We will change to Demadex 80 mg in the morning and at least for the next few days 40 mg in the afternoon.  If this is more effective in terms of diuresis and weight change, we will continue with Demadex although reduced to 80 mg in the morning with as needed 40 mg afternoon use.  Metolazone as needed may need to be considered ultimately.  2.  Nonischemic cardiomyopathy with LVEF approximately 35%.  Low blood pressure has  limited medical therapy.  She remains on Coreg and Aldactone.  3.  St. Jude biventricular ICD in place.  She continues to follow with Dr. [name].  No device shocks or syncope.  COVID-19 Education: The signs and symptoms of COVID-19 were discussed with the patient and how to seek care for testing (follow up with PCP or arrange E-visit).  The importance of social distancing was discussed today.  Time:   Today, I have spent 6 minutes with the patient with telehealth technology discussing the above problems.     Medication Adjustments/Labs and Tests Ordered: Current medicines are reviewed at length with the patient today.  Concerns regarding medicines are outlined above.   Tests Ordered: Orders Placed This Encounter  Procedures  . Basic metabolic panel    Medication Changes: Meds ordered this encounter  Medications  . torsemide (DEMADEX) 20 MG tablet    Sig: Take 4 tablets (80 mg total) by mouth every morning. & 40 mg in the afternoon 2-3 days, then reduce to 80 mg daily. May take 40 mg in the afternoon as needed    Dispense:  90 tablet    Refill:  3    06/05/2018 stop lasix    Disposition:  Follow up telehealth visit in 1 month.  Signed, Rozann Lesches, MD  06/05/2018 1:10 PM    [facility]

## 2018-06-05 ENCOUNTER — Encounter: Payer: Self-pay | Admitting: Cardiology

## 2018-06-05 ENCOUNTER — Telehealth (INDEPENDENT_AMBULATORY_CARE_PROVIDER_SITE_OTHER): Payer: Medicare Other | Admitting: Cardiology

## 2018-06-05 VITALS — BP 106/62 | HR 80 | Ht 62.5 in | Wt 183.1 lb

## 2018-06-05 DIAGNOSIS — Z95810 Presence of automatic (implantable) cardiac defibrillator: Secondary | ICD-10-CM | POA: Diagnosis not present

## 2018-06-05 DIAGNOSIS — Z7189 Other specified counseling: Secondary | ICD-10-CM

## 2018-06-05 DIAGNOSIS — I5022 Chronic systolic (congestive) heart failure: Secondary | ICD-10-CM | POA: Diagnosis not present

## 2018-06-05 DIAGNOSIS — I428 Other cardiomyopathies: Secondary | ICD-10-CM

## 2018-06-05 MED ORDER — TORSEMIDE 20 MG PO TABS: 80.0000 mg | ORAL_TABLET | ORAL | 3 refills | Status: DC

## 2018-06-05 NOTE — Patient Instructions (Addendum)
Medication Instructions:   Your physician has recommended you make the following change in your medication:   Stop furosemide  Start torsemide 80 mg by mouth in the morning and 40 mg in the afternoon for a few days, then reduce to 80 mg daily. May take 40 mg in the afternoon as needed for swelling  Continue all other medications the same  Labwork:  Your physician recommends that you return for lab work in: 2 weeks to check your BMET. Please have this done at [facility] or [facility]. Lab order mailed to patient and faxed to lab.  Testing/Procedures:  NONE  Follow-Up:  Your physician recommends that you schedule a follow-up appointment in: 1 month for a telehealth visit with Dr. Domenic Polite  Any Other Special Instructions Will Be Listed Below (If Applicable).  If you need a refill on your cardiac medications before your next appointment, please call your pharmacy.

## 2018-06-09 ENCOUNTER — Ambulatory Visit: Payer: Medicare Other | Admitting: *Deleted

## 2018-06-11 ENCOUNTER — Ambulatory Visit (INDEPENDENT_AMBULATORY_CARE_PROVIDER_SITE_OTHER): Payer: Medicare Other

## 2018-06-11 ENCOUNTER — Other Ambulatory Visit: Payer: Self-pay

## 2018-06-11 DIAGNOSIS — I5022 Chronic systolic (congestive) heart failure: Secondary | ICD-10-CM

## 2018-06-11 DIAGNOSIS — Z95810 Presence of automatic (implantable) cardiac defibrillator: Secondary | ICD-10-CM

## 2018-06-12 ENCOUNTER — Telehealth: Payer: Self-pay

## 2018-06-12 NOTE — Telephone Encounter (Signed)
Remote ICM transmission received.  Attempted call to patient regarding ICM remote transmission and left detailed message, per DPR, to return call.    

## 2018-06-12 NOTE — Progress Notes (Signed)
EPIC Encounter for ICM Monitoring  Patient Name: Melissa Paul is a 73 y.o. female Date: 06/12/2018 Primary Care Physican: Sharion Balloon, FNP Primary Cardiologist:[facility]owell Electrophysiologist: Allred Bi-V Pacing: 96% BaselineWeight: 172-174 lbs 05/20/2018 Weight:184.4 06/02/2018 Weight: 187.7 lbs  AT/AF Burden: 1.3%   Attempted call to patient and unable to reach.  Left message to return call. Transmission reviewed.     CorVueThoracic impedancereturned to normal.  Diuretic changed to Torsemide at 06/05/2018 visit with Dr Domenic Polite.  Prescribed:Torsemide 20 mg Take 4 tablets (80 mg total) by mouth every morning. & 40 mg in the afternoon 2-3 days, then reduce to 80 mg daily. May take 40 mg in the afternoon as needed    Labs: 11/12/2017 Creatinine 0.84, BUN 12, Potassium 3.9, Sodium 140, GFR 69-79 08/06/2017 Creatinine 1.01, BUN 12, Potassium 4.2, Sodium 140, GFR 55-63 02/27/2017 Creatinine 0.95, BUN 10, Potassium 3.8, Sodium 138, GFR 58->60  Recommendations: Unable to reach.    Follow-up plan: ICM clinic phone appointment on6/02/2018.  Copy of ICM check sent to Dr.Allredand Dr Domenic Polite to show effectiveness of Torsemide.   3 month ICM trend: 06/11/2018    1 Year ICM trend:       [name], RN 06/12/2018 4:00 PM

## 2018-06-17 NOTE — Progress Notes (Signed)
Virtual Visit via Telephone Note   This visit type was conducted due to national recommendations for restrictions regarding the COVID-19 Pandemic (e.g. social distancing) in an effort to limit this patient's exposure and mitigate transmission in our community.  Due to her co-morbid illnesses, this patient is at least at moderate risk for complications without adequate follow up.  This format is felt to be most appropriate for this patient at this time.  The patient did not have access to video technology/had technical difficulties with video requiring transitioning to audio format only (telephone).  All issues noted in this document were discussed and addressed.  No physical exam could be performed with this format.  Please refer to the patient's chart for her  consent to telehealth for Melissa Paul.   Date:  06/18/2018   ID:  Melissa Paul, DOB 1946-01-03, MRN [id number]  Patient Location: Home Provider Location: Office  PCP:  Sharion Balloon, FNP  Cardiologist:  Rozann Lesches, MD Electrophysiologist:  Thompson Grayer, MD   Evaluation Performed:  Follow-Up Visit  Chief Complaint:   Cardiac follow-up  History of Present Illness:    Melissa Paul is a 73 y.o. female last assessed by telehealth visit in mid May.  We spoke by phone again today.  She states that she is doing about the same, no major change in weight.  We switched her to [facility] from Lasix, but she had difficulty getting it at $25 a prescription, her pharmacy is looking into a discounted rate.  She went back on prior Lasix dose.  She sees Dr. [name] in the device clinic, [place] biventricular ICD in place.  Recent device check on May 21 showed AT/AF burden 1.3%, and thoracic impedance returned to normal.  The patient does not have symptoms concerning for COVID-19 infection (fever, chills, cough, or new shortness of breath).    Past Medical History:  Diagnosis Date  . Chronic systolic heart failure ([place])   .  Glucose intolerance (pre-diabetes)   . Hyperlipemia   . Hypothyroidism   . LBBB (left bundle branch block)   . Mitral regurgitation    Mild to Moderate  . Nonischemic cardiomyopathy ([place])    LVEF 35-40% by echo 8/13  . Paroxysmal atrial fibrillation (H[facility])    Brief episodes by device interrogation  . UTI (urinary tract infection)    Recurrent  . Ventricular tachycardia ([place])    ICD therapy for VT   Past Surgical History:  Procedure Laterality Date  . APPENDECTOMY    . BIV ICD GENERATOR CHANGEOUT N/A 07/11/2016   Procedure: BiV ICD Generator Changeout;  Surgeon: Thompson Grayer, MD;  Location: [place] CV LAB;  Service: Cardiovascular;  Laterality: N/A;  . CARDIAC DEFIBRILLATOR PLACEMENT  09/2009   St. Jude BiV ICD by Dr. Sandria Manly class III  . CHOLECYSTECTOMY    . has one ovary left    . KNEE ARTHROSCOPY     Left  . LEFT AND RIGHT HEART CATHETERIZATION WITH CORONARY ANGIOGRAM N/A 04/12/2014   Procedure: LEFT AND RIGHT HEART CATHETERIZATION WITH CORONARY ANGIOGRAM;  Surgeon: Leonie Man, MD;  Location: [facility] CATH LAB;  Service: Cardiovascular;  Laterality: N/A;  . Left breast biopsy x 2     no maliganancy  . TOTAL ABDOMINAL HYSTERECTOMY     (R) ovary removed     Current Meds  Medication Sig  . albuterol (PROVENTIL) (2.5 MG/3ML) 0.083% nebulizer solution Take 3 mLs (2.5 mg total) by nebulization every 6 (six) hours as  needed for wheezing or shortness of breath.  Marland Kitchen aspirin EC 81 MG tablet Take 81 mg by mouth daily.  . carvedilol (COREG) 12.5 MG tablet Take 1 tablet by mouth twice daily  . estradiol (ESTRACE) 0.1 MG/GM vaginal cream Place 1 Applicatorful vaginally at bedtime.  Arna Medici 75 MCG tablet Take 1 tablet by mouth once daily  . furosemide (LASIX) 80 MG tablet Take 80 mg by mouth daily. May take extra 40 mg in pm if wt is over 5 lbs gain  . metFORMIN (GLUCOPHAGE) 500 MG tablet Take 1 tablet by mouth twice daily  . pantoprazole (PROTONIX) 40 MG tablet Take 1 tablet by  mouth daily.  Marland Kitchen spironolactone (ALDACTONE) 25 MG tablet Take 1 tablet by mouth once daily     Allergies:   Bactrim; Cephalexin; Chlorhexidine gluconate; Clindamycin; Contrast media [iodinated diagnostic agents]; Doxycycline; Latex; Levofloxacin; Nitrofurantoin monohyd macro; and Penicillins   Social History   Tobacco Use  . Smoking status: Former Smoker    Packs/day: 0.30    Years: 20.00    Pack years: 6.00    Types: Cigarettes    Start date: 08/03/1949    Last attempt to quit: 01/22/1996    Years since quitting: 22.4  . Smokeless tobacco: Never Used  Substance Use Topics  . Alcohol use: No    Alcohol/week: 0.0 standard drinks  . Drug use: No     Family Hx: The patient's family history includes CVA in her mother; Cancer in her father; Heart Problems in her mother; Heart disease in her maternal grandfather and maternal grandmother. There is no history of Diabetes, Hypertension, or Coronary artery disease.  ROS:   Please see the history of present illness.    All other systems reviewed and are negative.   Prior CV studies:   The following studies were reviewed today:  Echocardiogram 10/29/2017: Study Conclusions  - Left ventricle: The cavity size was at the upper limits of normal. Wall thickness was normal. Systolic function was moderately to severely reduced. The estimated ejection fraction was 35%. Diffuse hypokinesis. Doppler parameters are consistent with abnormal left ventricular relaxation (grade 1 diastolic dysfunction). Doppler parameters are consistent with indeterminate ventricular filling pressure. - Ventricular septum: Septal motion showed abnormal function and dyssynergy. - Mitral valve: There was mild regurgitation. - Right ventricle: Pacer wire or catheter noted in right ventricle. Systolic function was moderately reduced. - Atrial septum: No defect or patent foramen ovale was identified. - Tricuspid valve: There was mild regurgitation.   Labs/Other Tests and Data Reviewed:    EKG:  An ECG dated 02/28/2017 was personally reviewed today and demonstrated:  Dual-chamber paced rhythm.  Recent Labs: 05/07/2018: ALT 6; BUN 13; Creatinine, Ser 0.83; Hemoglobin 13.0; Platelets 277; Potassium 4.1; Sodium 141; TSH 0.084  Jun 03, 2018: BUN 18, creatinine 0.92, potassium 3.4  Recent Lipid Panel Lab Results  Component Value Date/Time   CHOL 177 08/11/2017 10:19 AM   TRIG 108 08/11/2017 10:19 AM   HDL 50 08/11/2017 10:19 AM   CHOLHDL 3.5 08/11/2017 10:19 AM   CHOLHDL 3.3 04/11/2007 06:00 AM   LDLCALC 105 (H) 08/11/2017 10:19 AM    Wt Readings from Last 3 Encounters:  06/18/18 184 lb (83.5 kg)  06/05/18 183 lb 1.6 oz (83.1 kg)  05/07/18 183 lb 9.6 oz (83.3 kg)     Objective:    Vital Signs:  BP 101/62   Pulse 70   Ht 5' 2.5" (1.588 m)   Wt 184 lb (83.5 kg)  BMI 33.12 kg/m    Patient spoke in full sentences, not short of breath. No audible wheezing or cough.  ASSESSMENT & PLAN:    1.  Nonischemic cardiomyopathy with LVEF approximately 35%.  She has generally run a fairly low blood pressure which has limited medical therapy.  Continue with current regimen.  2.  Chronic combined heart failure.  Weight is stable.  As noted above, she transitioned back to Lasix reporting difficulties with the cost of Demadex.  Her pharmacy is looking into a discounted rate.  Also adding KCl 10 mEq daily.  Last potassium was 3.4.  3.  St. Jude biventricular ICD in place.  Keep follow-up with Dr. [name].  No device shocks or syncope.  COVID-19 Education: The signs and symptoms of COVID-19 were discussed with the patient and how to seek care for testing (follow up with PCP or arrange E-visit).  The importance of social distancing was discussed today.  Time:   Today, I have spent 5 minutes with the patient with telehealth technology discussing the above problems.     Medication Adjustments/Labs and Tests Ordered: Current medicines are  reviewed at length with the patient today.  Concerns regarding medicines are outlined above.   Tests Ordered: Orders Placed This Encounter  Procedures  . Basic Metabolic Panel (BMET)    Medication Changes: Meds ordered this encounter  Medications  . potassium chloride (K-DUR) 10 MEQ tablet    Sig: Take 1 tablet (10 mEq total) by mouth daily.    Dispense:  90 tablet    Refill:  3    Disposition:  Follow up 2 months in the [place] office.  Signed, Rozann Lesches, MD  06/18/2018 3:55 PM    [place]

## 2018-06-18 ENCOUNTER — Encounter: Payer: Self-pay | Admitting: Cardiology

## 2018-06-18 ENCOUNTER — Other Ambulatory Visit: Payer: Self-pay

## 2018-06-18 ENCOUNTER — Telehealth (INDEPENDENT_AMBULATORY_CARE_PROVIDER_SITE_OTHER): Payer: Medicare Other | Admitting: Cardiology

## 2018-06-18 VITALS — BP 101/62 | HR 70 | Ht 62.5 in | Wt 184.0 lb

## 2018-06-18 DIAGNOSIS — Z95810 Presence of automatic (implantable) cardiac defibrillator: Secondary | ICD-10-CM | POA: Diagnosis not present

## 2018-06-18 DIAGNOSIS — I428 Other cardiomyopathies: Secondary | ICD-10-CM

## 2018-06-18 DIAGNOSIS — I5022 Chronic systolic (congestive) heart failure: Secondary | ICD-10-CM

## 2018-06-18 DIAGNOSIS — Z7189 Other specified counseling: Secondary | ICD-10-CM

## 2018-06-18 MED ORDER — POTASSIUM CHLORIDE ER 10 MEQ PO TBCR: 10.0000 meq | EXTENDED_RELEASE_TABLET | Freq: Every day | ORAL | 3 refills | Status: DC

## 2018-06-18 NOTE — Patient Instructions (Addendum)
Medication Instructions:   START Potassium 10 meq daily   Labwork: BMET just BEFORE next visit in 2 months  Procedures/Testing: None  Follow-Up: 2 months in the [place] office with Dr.McDowell  Any Additional Special Instructions Will Be Listed Below (If Applicable).     If you need a refill on your cardiac medications before your next appointment, please call your pharmacy.     Thank you for choosing [facility] !

## 2018-06-22 ENCOUNTER — Ambulatory Visit: Payer: Medicare Other | Admitting: *Deleted

## 2018-06-24 ENCOUNTER — Ambulatory Visit (INDEPENDENT_AMBULATORY_CARE_PROVIDER_SITE_OTHER): Payer: Medicare Other

## 2018-06-24 DIAGNOSIS — Z95810 Presence of automatic (implantable) cardiac defibrillator: Secondary | ICD-10-CM

## 2018-06-24 DIAGNOSIS — I5022 Chronic systolic (congestive) heart failure: Secondary | ICD-10-CM | POA: Diagnosis not present

## 2018-06-26 ENCOUNTER — Telehealth: Payer: Medicare Other | Admitting: Cardiology

## 2018-06-26 NOTE — Progress Notes (Signed)
EPIC Encounter for ICM Monitoring  Patient Name: TAHIRIH LAIR is a 73 y.o. female Date: 06/26/2018 Primary Care Physican: Sharion Balloon, FNP Primary Cardiologist:McDowell Electrophysiologist: Allred Bi-V Pacing: 96% BaselineWeight: 172-174 lbs 05/20/2018 Weight:184.4 06/02/2018 Weight: 187.7 lbs  AT/AF Burden: 1.3%   Transmission reviewed.    CorVueThoracic impedance normal.  Prescribed:Furosemide 20 mg Take 4 tablets (80 mg total) by mouth every morning. May take 40 mg in the afternoon as needed if weight is over 5 lb gain.  Labs: 06/03/2018 Creatinine 0.92, BUN 18, Potassium 3.4, Sodium 133, GFR >60 05/07/2018 Creatinine 0.83, BUN 13, Potassium 4.1, Sodium 141, GFR 70-80 11/12/2017 Creatinine 0.84, BUN 12, Potassium 3.9, Sodium 140, GFR 69-79 08/06/2017 Creatinine 1.01, BUN 12, Potassium 4.2, Sodium 140, GFR 55-63 02/27/2017 Creatinine 0.95, BUN 10, Potassium 3.8, Sodium 138, GFR 58->60  Recommendations: Unable to reach.  Follow-up plan: ICM clinic phone appointment on7/06/2018.  Copy of ICM check sent to Dr.Allred  3 month ICM trend: 06/23/2018    1 Year ICM trend:       [name], RN 06/26/2018 4:31 PM

## 2018-06-29 ENCOUNTER — Other Ambulatory Visit: Payer: Self-pay | Admitting: *Deleted

## 2018-06-29 ENCOUNTER — Ambulatory Visit: Payer: Medicare Other | Admitting: *Deleted

## 2018-06-29 DIAGNOSIS — E039 Hypothyroidism, unspecified: Secondary | ICD-10-CM

## 2018-06-29 MED ORDER — LEVOTHYROXINE SODIUM 75 MCG PO TABS: 75.0000 ug | ORAL_TABLET | Freq: Every day | ORAL | 0 refills | Status: DC

## 2018-07-06 ENCOUNTER — Ambulatory Visit (INDEPENDENT_AMBULATORY_CARE_PROVIDER_SITE_OTHER): Payer: Medicare Other | Admitting: *Deleted

## 2018-07-06 DIAGNOSIS — I428 Other cardiomyopathies: Secondary | ICD-10-CM | POA: Diagnosis not present

## 2018-07-06 LAB — CUP PACEART REMOTE DEVICE CHECK
Battery Remaining Longevity: 56 mo
Battery Remaining Percentage: 70 %
Battery Voltage: 2.96 V
Brady Statistic AP VP Percent: 93 %
Brady Statistic AP VS Percent: 1.5 %
Brady Statistic AS VP Percent: 3.2 %
Brady Statistic AS VS Percent: 1 %
Brady Statistic RA Percent Paced: 91 %
Date Time Interrogation Session: 20200615113141
HighPow Impedance: 81 Ohm
HighPow Impedance: 81 Ohm
Implantable Lead Implant Date: 20110912
Implantable Lead Implant Date: 20110912
Implantable Lead Implant Date: 20120710
Implantable Lead Location: 753858
Implantable Lead Location: 753859
Implantable Lead Location: 753860
Implantable Lead Model: 7122
Implantable Pulse Generator Implant Date: 20180621
Lead Channel Impedance Value: 360 Ohm
Lead Channel Impedance Value: 540 Ohm
Lead Channel Impedance Value: 640 Ohm
Lead Channel Pacing Threshold Amplitude: 0.5 V
Lead Channel Pacing Threshold Amplitude: 0.625 V
Lead Channel Pacing Threshold Amplitude: 0.75 V
Lead Channel Pacing Threshold Pulse Width: 0.5 ms
Lead Channel Pacing Threshold Pulse Width: 0.5 ms
Lead Channel Pacing Threshold Pulse Width: 0.5 ms
Lead Channel Sensing Intrinsic Amplitude: 3.1 mV
Lead Channel Sensing Intrinsic Amplitude: 8.2 mV
Lead Channel Setting Pacing Amplitude: 2 V
Lead Channel Setting Pacing Amplitude: 2 V
Lead Channel Setting Pacing Amplitude: 2 V
Lead Channel Setting Pacing Pulse Width: 0.5 ms
Lead Channel Setting Pacing Pulse Width: 0.5 ms
Lead Channel Setting Sensing Sensitivity: 0.5 mV
Pulse Gen Serial Number: 9761374

## 2018-07-16 ENCOUNTER — Encounter: Payer: Self-pay | Admitting: Cardiology

## 2018-07-16 NOTE — Progress Notes (Signed)
Remote ICD transmission.   

## 2018-07-27 ENCOUNTER — Ambulatory Visit (INDEPENDENT_AMBULATORY_CARE_PROVIDER_SITE_OTHER): Payer: Medicare Other

## 2018-07-27 ENCOUNTER — Telehealth: Payer: Medicare Other | Admitting: Internal Medicine

## 2018-07-27 ENCOUNTER — Telehealth: Payer: Self-pay

## 2018-07-27 DIAGNOSIS — Z95810 Presence of automatic (implantable) cardiac defibrillator: Secondary | ICD-10-CM

## 2018-07-27 DIAGNOSIS — I5022 Chronic systolic (congestive) heart failure: Secondary | ICD-10-CM

## 2018-07-27 NOTE — Telephone Encounter (Signed)
Spoke with pt about appt on 07/29/18. Pt stated she does not have access to a smart device and would like a phone call. Pt was advise to check vitals prior to appt. Pt questions and concerns were address.

## 2018-07-28 ENCOUNTER — Telehealth: Payer: Self-pay

## 2018-07-28 NOTE — Progress Notes (Signed)
EPIC Encounter for ICM Monitoring  Patient Name: Melissa Paul is a 73 y.o. female Date: 07/28/2018 Primary Care Physican: Sharion Balloon, FNP Primary Cardiologist:McDowell Electrophysiologist: Allred Bi-V Pacing: 96% BaselineWeight: 172-174 lbs 06/02/2018 Weight: 187.7 lbs  AT/AF Burden: 1.4%    Attempted call to patient and unable to reach.  Left detailed message per DPR regarding transmission. Transmission reviewed.   CorVueThoracic impedance normal.  Prescribed:Furosemide 20 mgTake 4 tablets (80 mg total) by mouth every morning. May take 40 mg in the afternoon as needed if weight is over 5 lb gain.  Labs: 06/03/2018 Creatinine 0.92, BUN 18, Potassium 3.4, Sodium 133, GFR >60 05/07/2018 Creatinine 0.83, BUN 13, Potassium 4.1, Sodium 141, GFR 70-80 11/12/2017 Creatinine 0.84, BUN 12, Potassium 3.9, Sodium 140, GFR 69-79 08/06/2017 Creatinine 1.01, BUN 12, Potassium 4.2, Sodium 140, GFR 55-63 02/27/2017 Creatinine 0.95, BUN 10, Potassium 3.8, Sodium 138, GFR 58->60  Recommendations: Unable to reach.  Follow-up plan: ICM clinic phone appointment on7/06/2018.  Virtual visit with Dr [name] 07/29/2018.  Copy of ICM check sent to Dr.Allred.   AT/AF    3 month ICM trend: 07/28/2018    1 Year ICM trend:       [name], RN 07/28/2018 12:54 PM

## 2018-07-28 NOTE — Telephone Encounter (Signed)
LMOVM reminding pt to send remote transmission.   

## 2018-07-29 ENCOUNTER — Other Ambulatory Visit: Payer: Self-pay

## 2018-07-29 ENCOUNTER — Telehealth: Payer: Self-pay

## 2018-07-29 ENCOUNTER — Encounter: Payer: Self-pay | Admitting: Internal Medicine

## 2018-07-29 ENCOUNTER — Telehealth (INDEPENDENT_AMBULATORY_CARE_PROVIDER_SITE_OTHER): Payer: Medicare Other | Admitting: Internal Medicine

## 2018-07-29 VITALS — BP 116/91 | HR 77 | Ht 62.5 in | Wt 182.0 lb

## 2018-07-29 DIAGNOSIS — I480 Paroxysmal atrial fibrillation: Secondary | ICD-10-CM | POA: Diagnosis not present

## 2018-07-29 DIAGNOSIS — I472 Ventricular tachycardia, unspecified: Secondary | ICD-10-CM

## 2018-07-29 DIAGNOSIS — I5022 Chronic systolic (congestive) heart failure: Secondary | ICD-10-CM | POA: Diagnosis not present

## 2018-07-29 DIAGNOSIS — I428 Other cardiomyopathies: Secondary | ICD-10-CM

## 2018-07-29 NOTE — Telephone Encounter (Signed)
-----   Message from Thompson Grayer, MD sent at 07/29/2018  1:13 PM EDT ----- Needs AV opt echo with Amber next available

## 2018-07-29 NOTE — Progress Notes (Signed)
Electrophysiology TeleHealth Note   Due to national recommendations of social distancing due to [place] 19, an audio telehealth visit is felt to be most appropriate for this patient at this time.  Verbal consent was obtained by me for the telehealth visit today.  The patient does not have capability for a virtual visit.  A phone visit is therefore required today.   Date:  07/29/2018   ID:  Melissa Paul, DOB Jul 02, 1945, MRN [id number]  Location: patient's home  Provider location:  [facility]  Evaluation Performed: Follow-up visit  PCP:  Sharion Balloon, FNP   Electrophysiologist:  Dr [name]  Chief Complaint:  Follow up  History of Present Illness:    Melissa Paul is a 73 y.o. female who presents via telehealth conferencing today.  Since last being seen in our clinic, the patient reports doing very well.  Today, she denies symptoms of palpitations, chest pain, shortness of breath,  lower extremity edema, dizziness, presyncope, or syncope.  The patient is otherwise without complaint today.  The patient denies symptoms of fevers, chills, cough, or new SOB worrisome for COVID 19.  Past Medical History:  Diagnosis Date  . Chronic systolic heart failure ([place])   . Glucose intolerance (pre-diabetes)   . Hyperlipemia   . Hypothyroidism   . LBBB (left bundle branch block)   . Mitral regurgitation    Mild to Moderate  . Nonischemic cardiomyopathy ([place])    LVEF 35-40% by echo 8/13  . Paroxysmal atrial fibrillation ([facility])    Brief episodes by device interrogation  . UTI (urinary tract infection)    Recurrent  . Ventricular tachycardia ([place])    ICD therapy for VT    Past Surgical History:  Procedure Laterality Date  . APPENDECTOMY    . BIV ICD GENERATOR CHANGEOUT N/A 07/11/2016   Procedure: BiV ICD Generator Changeout;  Surgeon: Thompson Grayer, MD;  Location: [place] CV LAB;  Service: Cardiovascular;  Laterality: N/A;  . CARDIAC DEFIBRILLATOR PLACEMENT  09/2009   [employer]ude BiV ICD by Dr. Sandria Manly class III  . CHOLECYSTECTOMY    . has one ovary left    . KNEE ARTHROSCOPY     Left  . LEFT AND RIGHT HEART CATHETERIZATION WITH CORONARY ANGIOGRAM N/A 04/12/2014   Procedure: LEFT AND RIGHT HEART CATHETERIZATION WITH CORONARY ANGIOGRAM;  Surgeon: Leonie Man, MD;  Location: [facility] CATH LAB;  Service: Cardiovascular;  Laterality: N/A;  . Left breast biopsy x 2     no maliganancy  . TOTAL ABDOMINAL HYSTERECTOMY     (R) ovary removed    Current Outpatient Medications  Medication Sig Dispense Refill  . acetaminophen (TYLENOL) 500 MG tablet Take 500 mg by mouth every 6 (six) hours as needed for headache.    . albuterol (PROVENTIL) (2.5 MG/3ML) 0.083% nebulizer solution Take 3 mLs (2.5 mg total) by nebulization every 6 (six) hours as needed for wheezing or shortness of breath. 150 mL 1  . aspirin EC 81 MG tablet Take 81 mg by mouth daily.    . carvedilol (COREG) 12.5 MG tablet Take 1 tablet by mouth twice daily 180 tablet 0  . estradiol (ESTRACE) 0.1 MG/GM vaginal cream Place 1 Applicatorful vaginally at bedtime.    . furosemide (LASIX) 80 MG tablet Take 80 mg by mouth daily. May take extra 40 mg in pm if wt is over 5 lbs gain    . levothyroxine (EUTHYROX) 75 MCG tablet Take 1 tablet (75 mcg total)  by mouth daily. 90 tablet 0  . metFORMIN (GLUCOPHAGE) 500 MG tablet Take 1 tablet by mouth twice daily 180 tablet 0  . pantoprazole (PROTONIX) 40 MG tablet Take 1 tablet by mouth daily.    . potassium chloride (K-DUR) 10 MEQ tablet Take 1 tablet (10 mEq total) by mouth daily. 90 tablet 3  . spironolactone (ALDACTONE) 25 MG tablet Take 1 tablet by mouth once daily 90 tablet 1   No current facility-administered medications for this visit.     Allergies:   Bactrim, Cephalexin, Chlorhexidine gluconate, Clindamycin, Contrast media [iodinated diagnostic agents], Doxycycline, Latex, Levofloxacin, Nitrofurantoin monohyd macro, and Penicillins   Social History:  The patient   reports that she quit smoking about 22 years ago. Her smoking use included cigarettes. She started smoking about 69 years ago. She has a 6.00 pack-year smoking history. She has never used smokeless tobacco. She reports that she does not drink alcohol or use drugs.   Family History:  The patient's family history includes CVA in her mother; Cancer in her father; Heart Problems in her mother; Heart disease in her maternal grandfather and maternal grandmother.   ROS:  Please see the history of present illness.   All other systems are personally reviewed and negative.    Exam:    Vital Signs:  BP (!) 116/91   Pulse 77   Ht 5' 2.5" (1.588 m)   Wt 182 lb (82.6 kg)   BMI 32.76 kg/m   Well sounding today   Labs/Other Tests and Data Reviewed:    Recent Labs: 05/07/2018: ALT 6; BUN 13; Creatinine, Ser 0.83; Hemoglobin 13.0; Platelets 277; Potassium 4.1; Sodium 141; TSH 0.084   Wt Readings from Last 3 Encounters:  07/29/18 182 lb (82.6 kg)  06/18/18 184 lb (83.5 kg)  06/05/18 183 lb 1.6 oz (83.1 kg)     Last device remote is reviewed from [facility] PDF which reveals normal device function, no arrhythmias atrial oversensing (chronic), otherwise stable lead function   ASSESSMENT & PLAN:    1.  Chronic systolic heart failure/ICM Symptoms stable Recent remote reviewed with stable device function EF remains depressed. Will schedule AV opt echo  Consider referral to AHF clinic at follow up  2.  VF No recent recurrence  3.  Paroxysmal atrial fibrillation No recent true episodes of AF Noise noted on atrial lead CHADS2VASC is 3, she has declined anticoagulation   Follow-up:  With AV opt in next few weeks   Patient Risk:  after full review of this patients clinical status, I feel that they are at moderate risk at this time.  Today, I have spent 15 minutes with the patient with telehealth technology discussing arrhythmia management .    SignedThompson Grayer, MD  07/29/2018 1:05 PM      [place]  [address] [phone] (office) [phone] (fax)

## 2018-08-06 ENCOUNTER — Encounter (HOSPITAL_COMMUNITY): Payer: Self-pay

## 2018-08-06 ENCOUNTER — Ambulatory Visit (HOSPITAL_COMMUNITY): Payer: Medicare Other

## 2018-08-06 ENCOUNTER — Ambulatory Visit: Payer: Self-pay | Admitting: Family

## 2018-08-06 ENCOUNTER — Other Ambulatory Visit: Payer: Self-pay

## 2018-08-10 ENCOUNTER — Telehealth: Payer: Self-pay | Admitting: Internal Medicine

## 2018-08-10 NOTE — Telephone Encounter (Signed)
New Message  Patient has rescheduled her Echocardiogram for 08/17/18 at 10:35 am. Patient states that during this appointment for the echocardiogram that her pacemaker is supposed to be adjusted by Dr. [name]. I wanted to check to see if that was the case or whether the patient needed an appointment scheduled specifically with Dr. [name] to have her pacemaker adjusted. She states that she was under the impression that it is supposed to be done same day within that visit. Please advise and give patient a call back.

## 2018-08-10 NOTE — Telephone Encounter (Signed)
Melissa, would you mind please calling her when you get a chance?   She rescheduled her AV optimization echo (was scheduled for 7/16), but this has to be on a day that Amber is available and I don't know my way around her schedule!  Thank you!

## 2018-08-12 NOTE — Telephone Encounter (Signed)
Appt changed to 8/11.

## 2018-08-17 ENCOUNTER — Encounter: Payer: Self-pay | Admitting: Physician Assistant

## 2018-08-17 ENCOUNTER — Other Ambulatory Visit (HOSPITAL_COMMUNITY): Payer: Medicare Other

## 2018-08-17 ENCOUNTER — Ambulatory Visit (INDEPENDENT_AMBULATORY_CARE_PROVIDER_SITE_OTHER): Payer: Medicare Other | Admitting: Physician Assistant

## 2018-08-17 DIAGNOSIS — M791 Myalgia, unspecified site: Secondary | ICD-10-CM | POA: Diagnosis not present

## 2018-08-17 DIAGNOSIS — E039 Hypothyroidism, unspecified: Secondary | ICD-10-CM | POA: Diagnosis not present

## 2018-08-17 DIAGNOSIS — N3000 Acute cystitis without hematuria: Secondary | ICD-10-CM | POA: Diagnosis not present

## 2018-08-17 DIAGNOSIS — J988 Other specified respiratory disorders: Secondary | ICD-10-CM

## 2018-08-17 MED ORDER — LEVOTHYROXINE SODIUM 50 MCG PO TABS: 50.0000 ug | ORAL_TABLET | Freq: Every day | ORAL | 1 refills | Status: DC

## 2018-08-17 MED ORDER — CEFUROXIME AXETIL 250 MG PO TABS: 250.0000 mg | ORAL_TABLET | Freq: Two times a day (BID) | ORAL | 0 refills | Status: DC

## 2018-08-17 NOTE — Progress Notes (Signed)
Telephone visit  Subjective: CC: Weakness, congestion, abdominal pain PCP: Sharion Balloon, FNP FUX:NATFTDD Melissa Paul is a 73 y.o. female calls for telephone consult today. Patient provides verbal consent for consult held via phone.  Patient is identified with 2 separate identifiers.  At this time the entire area is on COVID-19 social distancing and stay home orders are in place.  Patient is of higher risk and therefore we are performing this by a virtual method.  Location of patient:  Location of provider: [facility] Others present for call: No  The patient is having recurrent urinary tract infections.  She gets these quite frequently.  I have reviewed her test and she is able to take cefuroxime.  This worked on her the last time.  The test was multi resistant.  She states she she is having some lower abdominal pain.  She is also just feeling very tired.  In reviewing her labs in it seems that she did not get a new dose of Synthroid sent to the pharmacy.  She is supposed to only be on 50 mcg instead of 75.  I have instructed this could make her feel more tired because it is overtreating her thyroid.  A new dose will be sent  This patient has had many days of sore throat and postnasal drainage, headache at times and sinus pressure. There is copious drainage at times. Denies any fever at this time. There has been a history of sinus infections in the past.  There is cough at night. It has become more prevalent in recent days.     ROS: Per HPI  Allergies  Allergen Reactions   Bactrim Rash   Cephalexin Rash    Says she is able to take cefuroxime   Chlorhexidine Gluconate     Hives, itching after using wipes with last procedure   Clindamycin Rash        Contrast Media [Iodinated Diagnostic Agents] Rash   Doxycycline Rash   Latex Rash        Levofloxacin Rash        Nitrofurantoin Monohyd Macro Rash        Penicillins Swelling and Rash    Has patient had a PCN  reaction causing immediate rash, facial/tongue/throat swelling, SOB or lightheadedness with hypotension: Yes Has patient had a PCN reaction causing severe rash involving mucus membranes or skin necrosis: Yes Has patient had a PCN reaction that required hospitalization: No Has patient had a PCN reaction occurring within the last 10 years: No Rash/swelled tongue If all of the above answers are "NO", then may proceed with Cephalosporin use.    Past Medical History:  Diagnosis Date   Chronic systolic heart failure (HCC)    Glucose intolerance (pre-diabetes)    Hyperlipemia    Hypothyroidism    LBBB (left bundle branch block)    Mitral regurgitation    Mild to Moderate   Nonischemic cardiomyopathy (HCC)    LVEF 35-40% by echo 8/13   Paroxysmal atrial fibrillation (HCC)    Brief episodes by device interrogation   UTI (urinary tract infection)    Recurrent   Ventricular tachycardia ([place])    ICD therapy for VT    Current Outpatient Medications:    acetaminophen (TYLENOL) 500 MG tablet, Take 500 mg by mouth every 6 (six) hours as needed for headache., Disp: , Rfl:    albuterol (PROVENTIL) (2.5 MG/3ML) 0.083% nebulizer solution, Take 3 mLs (2.5 mg total) by nebulization every 6 (six) hours  as needed for wheezing or shortness of breath., Disp: 150 mL, Rfl: 1   aspirin EC 81 MG tablet, Take 81 mg by mouth daily., Disp: , Rfl:    carvedilol (COREG) 12.5 MG tablet, Take 1 tablet by mouth twice daily, Disp: 180 tablet, Rfl: 0   cefUROXime (CEFTIN) 250 MG tablet, Take 1 tablet (250 mg total) by mouth 2 (two) times daily with a meal., Disp: 20 tablet, Rfl: 0   estradiol (ESTRACE) 0.1 MG/GM vaginal cream, Place 1 Applicatorful vaginally at bedtime., Disp: , Rfl:    furosemide (LASIX) 80 MG tablet, Take 80 mg by mouth daily. May take extra 40 mg in pm if wt is over 5 lbs gain, Disp: , Rfl:    levothyroxine (SYNTHROID) 50 MCG tablet, Take 1 tablet (50 mcg total) by mouth daily.,  Disp: 90 tablet, Rfl: 1   metFORMIN (GLUCOPHAGE) 500 MG tablet, Take 1 tablet by mouth twice daily, Disp: 180 tablet, Rfl: 0   pantoprazole (PROTONIX) 40 MG tablet, Take 1 tablet by mouth daily., Disp: , Rfl:    potassium chloride (K-DUR) 10 MEQ tablet, Take 1 tablet (10 mEq total) by mouth daily., Disp: 90 tablet, Rfl: 3   spironolactone (ALDACTONE) 25 MG tablet, Take 1 tablet by mouth once daily, Disp: 90 tablet, Rfl: 1  Assessment/ Plan: 73 y.o. female   1. Myalgia - cefUROXime (CEFTIN) 250 MG tablet; Take 1 tablet (250 mg total) by mouth 2 (two) times daily with a meal.  Dispense: 20 tablet; Refill: 0  2. Congestion of upper airway - cefUROXime (CEFTIN) 250 MG tablet; Take 1 tablet (250 mg total) by mouth 2 (two) times daily with a meal.  Dispense: 20 tablet; Refill: 0  3. Hypothyroidism, unspecified type - levothyroxine (SYNTHROID) 50 MCG tablet; Take 1 tablet (50 mcg total) by mouth daily.  Dispense: 90 tablet; Refill: 1  4. Acute cystitis without hematuria - cefUROXime (CEFTIN) 250 MG tablet; Take 1 tablet (250 mg total) by mouth 2 (two) times daily with a meal.  Dispense: 20 tablet; Refill: 0   No follow-ups on file.  Continue all other maintenance medications as listed above.  Start time: 12:36 PM End time: 12:51 PM  Meds ordered this encounter  Medications   levothyroxine (SYNTHROID) 50 MCG tablet    Sig: Take 1 tablet (50 mcg total) by mouth daily.    Dispense:  90 tablet    Refill:  1    Order Specific Question:   Supervising Provider    Answer:   Janora Norlander [[id number]]   cefUROXime (CEFTIN) 250 MG tablet    Sig: Take 1 tablet (250 mg total) by mouth 2 (two) times daily with a meal.    Dispense:  20 tablet    Refill:  0    Order Specific Question:   Supervising Provider    Answer:   Janora Norlander [[id number]]    Particia Nearing PA-C [facility] [phone]

## 2018-08-20 ENCOUNTER — Telehealth: Payer: Self-pay | Admitting: *Deleted

## 2018-08-20 LAB — BASIC METABOLIC PANEL
BUN/Creatinine Ratio: 14 (calc) (ref 6–22)
BUN: 15 mg/dL (ref 7–25)
CO2: 30 mmol/L (ref 20–32)
Calcium: 9.7 mg/dL (ref 8.6–10.4)
Chloride: 99 mmol/L (ref 98–110)
Creat: 1.09 mg/dL — ABNORMAL HIGH (ref 0.60–0.93)
Glucose, Bld: 142 mg/dL — ABNORMAL HIGH (ref 65–139)
Potassium: 4.4 mmol/L (ref 3.5–5.3)
Sodium: 137 mmol/L (ref 135–146)

## 2018-08-20 NOTE — Telephone Encounter (Signed)
-----   Message from Satira Sark, MD sent at 08/20/2018  8:04 AM EDT ----- Results reviewed.  Renal function is stable overall, creatinine 1.09 and potassium normal at 4.4.

## 2018-08-20 NOTE — Telephone Encounter (Signed)
Patient informed. Copy sent to PCP °

## 2018-08-24 ENCOUNTER — Telehealth (INDEPENDENT_AMBULATORY_CARE_PROVIDER_SITE_OTHER): Payer: Medicare Other | Admitting: Cardiology

## 2018-08-24 ENCOUNTER — Encounter: Payer: Self-pay | Admitting: Cardiology

## 2018-08-24 VITALS — BP 105/73 | HR 76 | Temp 95.7°F | Ht 62.5 in | Wt 186.0 lb

## 2018-08-24 DIAGNOSIS — I480 Paroxysmal atrial fibrillation: Secondary | ICD-10-CM

## 2018-08-24 DIAGNOSIS — Z95810 Presence of automatic (implantable) cardiac defibrillator: Secondary | ICD-10-CM

## 2018-08-24 DIAGNOSIS — I428 Other cardiomyopathies: Secondary | ICD-10-CM | POA: Diagnosis not present

## 2018-08-24 DIAGNOSIS — I5022 Chronic systolic (congestive) heart failure: Secondary | ICD-10-CM | POA: Diagnosis not present

## 2018-08-24 NOTE — Progress Notes (Signed)
Virtual Visit via Telephone Note   This visit type was conducted due to national recommendations for restrictions regarding the COVID-19 Pandemic (e.g. social distancing) in an effort to limit this patient's exposure and mitigate transmission in our community.  Due to her co-morbid illnesses, this patient is at least at moderate risk for complications without adequate follow up.  This format is felt to be most appropriate for this patient at this time.  The patient did not have access to video technology/had technical difficulties with video requiring transitioning to audio format only (telephone).  All issues noted in this document were discussed and addressed.  No physical exam could be performed with this format.  Please refer to the patient's chart for her  consent to telehealth for [facility].   Date:  08/24/2018   ID:  Melissa Paul, DOB 15-May-1945, MRN [id number]  Patient Location: Home Provider Location: Office  PCP:  Sharion Balloon, FNP  Cardiologist:  Rozann Lesches, MD  Electrophysiologist:  Thompson Grayer, MD   Evaluation Performed:  Follow-Up Visit  Chief Complaint:   Cardiac follow-up  History of Present Illness:    Melissa Paul is a 73 y.o. female last assessed via telehealth encounter in May.  She did not have video access and we spoke by phone.  She states that she has been struggling with allergy symptoms recently, also a stomach virus.  She has been weak, her weight is up about 4 pounds.  She states that she has been taking her medications.  She sees Dr. [name] in the device clinic, [place] biventricular ICD in place.  She is scheduled for an AV optimization echocardiogram per Dr. [name].  Last LVEF was approximately 35% with diffuse hypokinesis as of October 2019.  Most recent device interrogation showed AF burden of only 1.4%, she has declined anticoagulation.  Recent lab work is outlined below.  We went over her medications which are stable.  She did not  switch from Lasix to [facility] reporting problems with cost.  As noted previously, we have been limited in additional medical therapy due to low to low normal blood pressure at baseline.  The patient does not have symptoms concerning for COVID-19 infection (fever, chills, cough, or new shortness of breath).    Past Medical History:  Diagnosis Date  . Chronic systolic heart failure ([place])   . Glucose intolerance (pre-diabetes)   . Hyperlipemia   . Hypothyroidism   . LBBB (left bundle branch block)   . Mitral regurgitation    Mild to Moderate  . Nonischemic cardiomyopathy ([place])    LVEF 35-40% by echo 8/13  . Paroxysmal atrial fibrillation (HCC)    Brief episodes by device interrogation  . UTI (urinary tract infection)    Recurrent  . Ventricular tachycardia ([place])    ICD therapy for VT   Past Surgical History:  Procedure Laterality Date  . APPENDECTOMY    . BIV ICD GENERATOR CHANGEOUT N/A 07/11/2016   Procedure: BiV ICD Generator Changeout;  Surgeon: Thompson Grayer, MD;  Location: [place] CV LAB;  Service: Cardiovascular;  Laterality: N/A;  . CARDIAC DEFIBRILLATOR PLACEMENT  09/2009   St. Jude BiV ICD by Dr. Sandria Manly class III  . CHOLECYSTECTOMY    . has one ovary left    . KNEE ARTHROSCOPY     Left  . LEFT AND RIGHT HEART CATHETERIZATION WITH CORONARY ANGIOGRAM N/A 04/12/2014   Procedure: LEFT AND RIGHT HEART CATHETERIZATION WITH CORONARY ANGIOGRAM;  Surgeon: Leonie Man,  MD;  Location: [place] CATH LAB;  Service: Cardiovascular;  Laterality: N/A;  . Left breast biopsy x 2     no maliganancy  . TOTAL ABDOMINAL HYSTERECTOMY     (R) ovary removed     Current Meds  Medication Sig  . acetaminophen (TYLENOL) 500 MG tablet Take 500 mg by mouth every 6 (six) hours as needed for headache.  . albuterol (PROVENTIL) (2.5 MG/3ML) 0.083% nebulizer solution Take 3 mLs (2.5 mg total) by nebulization every 6 (six) hours as needed for wheezing or shortness of breath.  Marland Kitchen aspirin EC 81 MG  tablet Take 81 mg by mouth daily.  . carvedilol (COREG) 12.5 MG tablet Take 1 tablet by mouth twice daily  . cefUROXime (CEFTIN) 250 MG tablet Take 1 tablet (250 mg total) by mouth 2 (two) times daily with a meal.  . estradiol (ESTRACE) 0.1 MG/GM vaginal cream Place 1 Applicatorful vaginally at bedtime.  . furosemide (LASIX) 80 MG tablet Take 80 mg by mouth daily. May take extra 40 mg in pm if wt is over 5 lbs gain  . levothyroxine (SYNTHROID) 50 MCG tablet Take 1 tablet (50 mcg total) by mouth daily.  . metFORMIN (GLUCOPHAGE) 500 MG tablet Take 1 tablet by mouth twice daily  . pantoprazole (PROTONIX) 40 MG tablet Take 1 tablet by mouth daily.  . potassium chloride (K-DUR) 10 MEQ tablet Take 1 tablet (10 mEq total) by mouth daily.  Marland Kitchen spironolactone (ALDACTONE) 25 MG tablet Take 1 tablet by mouth once daily     Allergies:   Bactrim, Cephalexin, Chlorhexidine gluconate, Clindamycin, Contrast media [iodinated diagnostic agents], Doxycycline, Latex, Levofloxacin, Nitrofurantoin monohyd macro, and Penicillins   Social History   Tobacco Use  . Smoking status: Former Smoker    Packs/day: 0.30    Years: 20.00    Pack years: 6.00    Types: Cigarettes    Start date: 08/03/1949    Quit date: 01/22/1996    Years since quitting: 22.6  . Smokeless tobacco: Never Used  Substance Use Topics  . Alcohol use: No    Alcohol/week: 0.0 standard drinks  . Drug use: No     Family Hx: The patient's family history includes CVA in her mother; Cancer in her father; Heart Problems in her mother; Heart disease in her maternal grandfather and maternal grandmother. There is no history of Diabetes, Hypertension, or Coronary artery disease.  ROS:   Please see the history of present illness. All other systems reviewed and are negative.   Prior CV studies:   The following studies were reviewed today:  Echocardiogram 10/29/2017: Study Conclusions  - Left ventricle: The cavity size was at the upper limits of  normal. Wall thickness was normal. Systolic function was moderately to severely reduced. The estimated ejection fraction was 35%. Diffuse hypokinesis. Doppler parameters are consistent with abnormal left ventricular relaxation (grade 1 diastolic dysfunction). Doppler parameters are consistent with indeterminate ventricular filling pressure. - Ventricular septum: Septal motion showed abnormal function and dyssynergy. - Mitral valve: There was mild regurgitation. - Right ventricle: Pacer wire or catheter noted in right ventricle. Systolic function was moderately reduced. - Atrial septum: No defect or patent foramen ovale was identified. - Tricuspid valve: There was mild regurgitation.  Labs/Other Tests and Data Reviewed:    EKG:  An ECG dated 02/28/2017 was personally reviewed today and demonstrated:  Dual-chamber paced rhythm.  Recent Labs: 05/07/2018: ALT 6; Hemoglobin 13.0; Platelets 277; TSH 0.084 08/19/2018: BUN 15; Creat 1.09; Potassium 4.4; Sodium 137  Wt Readings from Last 3 Encounters:  08/24/18 186 lb (84.4 kg)  07/29/18 182 lb (82.6 kg)  06/18/18 184 lb (83.5 kg)     Objective:    Vital Signs:  BP 105/73   Pulse 76   Temp (!) 95.7 F (35.4 C)   Ht 5' 2.5" (1.588 m)   Wt 186 lb (84.4 kg)   BMI 33.48 kg/m    Patient spoke in full sentences, not short of breath. No audible wheezing or coughing. Speech pattern normal.  ASSESSMENT & PLAN:    1.  Chronic combined heart failure.  Weight is up about 4 pounds.  I asked her to take an additional Lasix 40 mg daily for the next few days and continue to track weight.  2.  Nonischemic cardiomyopathy with LVEF 35%.  She remains on Coreg, Lasix with potassium supplements, and Aldactone.  She tends to run a low to low normal blood pressure and we have not pushed to add Entresto as she tends to be symptomatic and lightheaded when her blood pressures are down.  She is scheduled for an AV optimization  echocardiogram this month.  3.  [employer] Jude biventricular ICD in place.  She continues to follow with Dr. [name].  She does not report any shocks or syncope.  4.  Paroxysmal, low burden atrial fibrillation.  She has declined anticoagulation.  COVID-19 Education: The signs and symptoms of COVID-19 were discussed with the patient and how to seek care for testing (follow up with PCP or arrange E-visit).  The importance of social distancing was discussed today.  Time:   Today, I have spent 7 minutes with the patient with telehealth technology discussing the above problems.     Medication Adjustments/Labs and Tests Ordered: Current medicines are reviewed at length with the patient today.  Concerns regarding medicines are outlined above.   Tests Ordered: No orders of the defined types were placed in this encounter.   Medication Changes: No orders of the defined types were placed in this encounter.   Follow Up:  Virtual Visit 6 to 8 weeks.  Signed, Rozann Lesches, MD  08/24/2018 9:35 AM    [place]

## 2018-08-24 NOTE — Patient Instructions (Signed)
Medication Instructions:  Continue all current medications.  Labwork: none  Testing/Procedures: none  Follow-Up: 6-8 weeks   Any Other Special Instructions Will Be Listed Below (If Applicable).  If you need a refill on your cardiac medications before your next appointment, please call your pharmacy.

## 2018-08-31 ENCOUNTER — Ambulatory Visit (INDEPENDENT_AMBULATORY_CARE_PROVIDER_SITE_OTHER): Payer: Medicare Other

## 2018-08-31 DIAGNOSIS — Z95810 Presence of automatic (implantable) cardiac defibrillator: Secondary | ICD-10-CM

## 2018-08-31 DIAGNOSIS — I5022 Chronic systolic (congestive) heart failure: Secondary | ICD-10-CM

## 2018-09-01 ENCOUNTER — Encounter: Payer: Medicare Other | Admitting: Nurse Practitioner

## 2018-09-01 ENCOUNTER — Ambulatory Visit (HOSPITAL_COMMUNITY): Payer: Medicare Other | Attending: Internal Medicine

## 2018-09-01 ENCOUNTER — Ambulatory Visit (INDEPENDENT_AMBULATORY_CARE_PROVIDER_SITE_OTHER): Payer: Medicare Other | Admitting: Student

## 2018-09-01 ENCOUNTER — Other Ambulatory Visit: Payer: Self-pay

## 2018-09-01 DIAGNOSIS — I428 Other cardiomyopathies: Secondary | ICD-10-CM

## 2018-09-01 DIAGNOSIS — I5022 Chronic systolic (congestive) heart failure: Secondary | ICD-10-CM | POA: Diagnosis present

## 2018-09-01 LAB — CUP PACEART INCLINIC DEVICE CHECK
Battery Remaining Longevity: 50 mo
Brady Statistic RA Percent Paced: 90 %
Brady Statistic RV Percent Paced: 96 %
Date Time Interrogation Session: 20200811110416
HighPow Impedance: 78.75 Ohm
Implantable Lead Implant Date: 20110912
Implantable Lead Implant Date: 20110912
Implantable Lead Implant Date: 20120710
Implantable Lead Location: 753858
Implantable Lead Location: 753859
Implantable Lead Location: 753860
Implantable Lead Model: 7122
Implantable Pulse Generator Implant Date: 20180621
Lead Channel Impedance Value: 337.5 Ohm
Lead Channel Impedance Value: 462.5 Ohm
Lead Channel Impedance Value: 575 Ohm
Lead Channel Pacing Threshold Amplitude: 0.75 V
Lead Channel Pacing Threshold Amplitude: 0.75 V
Lead Channel Pacing Threshold Amplitude: 0.75 V
Lead Channel Pacing Threshold Amplitude: 0.75 V
Lead Channel Pacing Threshold Amplitude: 0.75 V
Lead Channel Pacing Threshold Pulse Width: 0.5 ms
Lead Channel Pacing Threshold Pulse Width: 0.5 ms
Lead Channel Pacing Threshold Pulse Width: 0.5 ms
Lead Channel Pacing Threshold Pulse Width: 0.5 ms
Lead Channel Pacing Threshold Pulse Width: 0.5 ms
Lead Channel Sensing Intrinsic Amplitude: 12 mV
Lead Channel Sensing Intrinsic Amplitude: 3.5 mV
Lead Channel Setting Pacing Amplitude: 2 V
Lead Channel Setting Pacing Amplitude: 2 V
Lead Channel Setting Pacing Amplitude: 2 V
Lead Channel Setting Pacing Pulse Width: 0.5 ms
Lead Channel Setting Pacing Pulse Width: 0.5 ms
Lead Channel Setting Sensing Sensitivity: 0.5 mV
Pulse Gen Serial Number: 9761374

## 2018-09-01 MED ORDER — CARVEDILOL 6.25 MG PO TABS: 18.7500 mg | ORAL_TABLET | Freq: Two times a day (BID) | ORAL | 3 refills | Status: DC

## 2018-09-01 NOTE — Progress Notes (Signed)
Pt presented for AV optimization. Threshold and impedance stable. Pt intrinsic AS-VS 52 bpm, with A-V delay 160 ms.  Quick-Opt ran which recommended changing AV delay from 170 paced and 130 sensed to 150 paced and 100 sensed.     E+A waves examined under echocardiogram. E & A waves fused at 170 ms and minimal change with either increasing or decreasing due to frequent PVCs.  Adjusted beta blocker and plan to follow up. PVC burden ~ 2% and thought to be underestimated do to early PVCs that occur after an AP.   RA noise noted that has been seen previously, impedance stable as above. Pt known to have low AF burden (~1.5%). Refuses [place].  Will see back in 4 weeks to re-assess PVC burden.  Legrand Como [address] [place], PA-C 09/01/2018 11:37 AM

## 2018-09-04 NOTE — Progress Notes (Signed)
EPIC Encounter for ICM Monitoring  Patient Name: Melissa Paul is a 73 y.o. female Date: 09/04/2018 Primary Care Physican: Sharion Balloon, FNP Primary Cardiologist:[place] Electrophysiologist: Allred Bi-V Pacing: 88% BaselineWeight: 172-174 lbs Last Weight: 187.7 lbs       Attempted call to patient and unable to reach.  Left detailed message per DPR regarding transmission. Transmission reviewed.   CorVueThoracic impedance suggesting possible ongoing fluid accumulation since 08/27/2018. Patient advised to take extra Furosemide for a few days at 8/3 OV with Dr Domenic Polite  Prescribed:Furosemide20 mgTake 4 tablets (80 mg total) by mouth every morning.May take 40 mg in the afternoon as needed if weight is over 5 lb gain.  Labs: 08/19/2018 Creatinine 1.09, BUN 15, Potassium 4.4, Sodium 137 06/03/2018 Creatinine 0.92, BUN 18, Potassium 3.4, Sodium 133, GFR >60 05/07/2018 Creatinine 0.83, BUN 13, Potassium 4.1, Sodium 141, GFR 70-80  Recommendations: Unable to reach.  Follow-up plan: ICM clinic phone appointment on8/24/2020 to recheck fluid levels.  Copy of ICM check sent to Dr.Allred and Dr Domenic Polite.   3 month ICM trend: 09/02/2018    1 Year ICM trend:       [name], RN 09/04/2018 8:20 AM

## 2018-09-14 ENCOUNTER — Ambulatory Visit: Payer: Medicare Other

## 2018-09-14 ENCOUNTER — Other Ambulatory Visit: Payer: Self-pay

## 2018-09-14 DIAGNOSIS — Z95810 Presence of automatic (implantable) cardiac defibrillator: Secondary | ICD-10-CM

## 2018-09-14 DIAGNOSIS — I5022 Chronic systolic (congestive) heart failure: Secondary | ICD-10-CM

## 2018-09-15 ENCOUNTER — Telehealth: Payer: Self-pay

## 2018-09-15 ENCOUNTER — Ambulatory Visit (INDEPENDENT_AMBULATORY_CARE_PROVIDER_SITE_OTHER): Payer: Medicare Other | Admitting: Family

## 2018-09-15 ENCOUNTER — Encounter: Payer: Self-pay | Admitting: Family

## 2018-09-15 VITALS — BP 121/77 | HR 69 | Temp 97.1°F | Ht 62.5 in | Wt 195.2 lb

## 2018-09-15 DIAGNOSIS — K219 Gastro-esophageal reflux disease without esophagitis: Secondary | ICD-10-CM

## 2018-09-15 DIAGNOSIS — F411 Generalized anxiety disorder: Secondary | ICD-10-CM | POA: Diagnosis not present

## 2018-09-15 DIAGNOSIS — R140 Abdominal distension (gaseous): Secondary | ICD-10-CM | POA: Diagnosis not present

## 2018-09-15 DIAGNOSIS — R252 Cramp and spasm: Secondary | ICD-10-CM

## 2018-09-15 MED ORDER — ESCITALOPRAM OXALATE 5 MG PO TABS: 5.0000 mg | ORAL_TABLET | Freq: Every day | ORAL | 1 refills | Status: DC

## 2018-09-15 MED ORDER — BUSPIRONE HCL 5 MG PO TABS: 5.0000 mg | ORAL_TABLET | Freq: Three times a day (TID) | ORAL | 1 refills | Status: DC | PRN

## 2018-09-15 NOTE — Progress Notes (Signed)
 Subjective:    Patient ID: Melissa Paul, female    DOB: 05/28/1945, 73 y.o.   MRN: [id number]  Chief Complaint  Patient presents with  . constant pain in both legs  . bloating after eating with burps and belching   PT presents presents to the office today with bilateral leg pain that has worsen over the last 4-5 months. She states the pain is "all over" and feels like stabbing, shooting intermittent pain of 10 out 10. She does not know of any triggers ans states she could be sitting, standing, or sleeping.  Gastroesophageal Reflux She complains of abdominal pain, belching and heartburn. She reports no dysphagia or no early satiety. This is a chronic problem. The current episode started more than 1 year ago. The problem has been waxing and waning. The heartburn is of moderate intensity. The heartburn limits her activity. The symptoms are aggravated by certain foods. She has tried a PPI for the symptoms. The treatment provided moderate relief.  Anxiety Presents for follow-up visit. Symptoms include depressed mood, excessive worry, irritability, nervous/anxious behavior, panic and restlessness. Symptoms occur most days. The severity of symptoms is moderate.        Review of Systems  Constitutional: Positive for irritability.  Gastrointestinal: Positive for abdominal pain and heartburn. Negative for dysphagia.  Psychiatric/Behavioral: The patient is nervous/anxious.   All other systems reviewed and are negative.      Objective:   Physical Exam Vitals signs reviewed.  Constitutional:      General: She is not in acute distress.    Appearance: She is well-developed.  HENT:     Head: Normocephalic and atraumatic.     Right Ear: Tympanic membrane normal.     Left Ear: Tympanic membrane normal.  Eyes:     Pupils: Pupils are equal, round, and reactive to light.  Neck:     Musculoskeletal: Normal range of motion and neck supple.     Thyroid: No thyromegaly.  Cardiovascular:   Rate and Rhythm: Normal rate and regular rhythm.     Heart sounds: Normal heart sounds. No murmur.  Pulmonary:     Effort: Pulmonary effort is normal. No respiratory distress.     Breath sounds: Normal breath sounds. No wheezing.  Abdominal:     General: Bowel sounds are normal. There is no distension.     Palpations: Abdomen is soft.     Tenderness: There is no abdominal tenderness.  Musculoskeletal: Normal range of motion.        General: No tenderness.     Right lower leg: Right lower leg edema: no swelling noted.     Left lower leg: Left lower leg edema: no swelling noted.  Skin:    General: Skin is warm and dry.  Neurological:     Mental Status: She is alert and oriented to person, place, and time.     Cranial Nerves: No cranial nerve deficit.     Deep Tendon Reflexes: Reflexes are normal and symmetric.  Psychiatric:        Behavior: Behavior normal.        Thought Content: Thought content normal.        Judgment: Judgment normal.       BP 121/77   Pulse 69   Temp (!) 97.1 F (36.2 C) (Oral)   Ht 5' 2.5" (1.588 m)   Wt 195 lb 3.2 oz (88.5 kg)   BMI 35.13 kg/m      Assessment & Plan:  Melissa   Melissa Paul comes in today with chief complaint of constant pain in both legs and bloating after eating with burps and belching   Diagnosis and orders addressed:  1. Leg cramp I believe this is coming from her lasix. She states she has about "7lbs of fluid on her now", however, on exam I do not see any edema. We discussed only taking extra lasix for swelling. She will hold her extra dose of 40 mg lasix in the evening for the next week unless she has edema   - BMP8+EGFR  2. GAD (generalized anxiety disorder) Will start lexapro 5 mg and buspar as needed She requested xanax, discussed we could not start this related to her age - escitalopram (LEXAPRO) 5 MG tablet; Take 1 tablet (5 mg total) by mouth daily.  Dispense: 90 tablet; Refill: 1 - busPIRone (BUSPAR) 5 MG tablet; Take 1  tablet (5 mg total) by mouth 3 (three) times daily as needed.  Dispense: 60 tablet; Refill: 1 - BMP8+EGFR  3. Gastroesophageal reflux disease, esophagitis presence not specified She does not have medication coverage and she can not afford Dexilant. She will continue Protonix.  -Diet discussed- Avoid fried, spicy, citrus foods, caffeine and alcohol -Do not eat 2-3 hours before bedtime -Encouraged small frequent meals -Avoid NSAID's -Gas-x OTC as needed  - Ambulatory referral to Gastroenterology - BMP8+EGFR  4. Bloating   Labs pending Diet and exercise encouraged  Follow up plan: 4 weeks   Evelina Dun, FNP

## 2018-09-15 NOTE — Telephone Encounter (Signed)
Left message for patient to remind of missed remote transmission.  

## 2018-09-15 NOTE — Patient Instructions (Signed)
Abdominal Bloating When you have abdominal bloating, your abdomen may feel full, tight, or painful. It may also look bigger than normal or swollen (distended). Common causes of abdominal bloating include:  Swallowing air.  Constipation.  Problems digesting food.  Eating too much.  Irritable bowel syndrome. This is a condition that affects the large intestine.  Lactose intolerance. This is an inability to digest lactose, a natural sugar in dairy products.  Celiac disease. This is a condition that affects the ability to digest gluten, a protein found in some grains.  Gastroparesis. This is a condition that slows down the movement of food in the stomach and small intestine. It is more common in people with diabetes mellitus.  Gastroesophageal reflux disease (GERD). This is a digestive condition that makes stomach acid flow back into the esophagus.  Urinary retention. This means that the body is holding onto urine, and the bladder cannot be emptied all the way. Follow these instructions at home: Eating and drinking  Avoid eating too much.  Try not to swallow air while talking or eating.  Avoid eating while lying down.  Avoid these foods and drinks: ? Foods that cause gas, such as broccoli, cabbage, cauliflower, and baked beans. ? Carbonated drinks. ? Hard candy. ? Chewing gum. Medicines  Take over-the-counter and prescription medicines only as told by your health care provider.  Take probiotic medicines. These medicines contain live bacteria or yeasts that can help digestion.  Take coated peppermint oil capsules. Activity  Try to exercise regularly. Exercise may help to relieve bloating that is caused by gas and relieve constipation. General instructions  Keep all follow-up visits as told by your health care provider. This is important. Contact a health care provider if:  You have nausea and vomiting.  You have diarrhea.  You have abdominal pain.  You have unusual  weight loss or weight gain.  You have severe pain, and medicines do not help. Get help right away if:  You have severe chest pain.  You have trouble breathing.  You have shortness of breath.  You have trouble urinating.  You have darker urine than normal.  You have blood in your stools or have dark, tarry stools. Summary  Abdominal bloating means that the abdomen is swollen.  Common causes of abdominal bloating are swallowing air, constipation, and problems digesting food.  Avoid eating too much and avoid swallowing air.  Avoid foods that cause gas, carbonated drinks, hard candy, and chewing gum. This information is not intended to replace advice given to you by your health care provider. Make sure you discuss any questions you have with your health care provider. Document Released: 02/09/2016 Document Revised: 04/27/2018 Document Reviewed: 02/09/2016 Elsevier Patient Education  2020 Else[employer] Inc.  

## 2018-09-16 NOTE — Progress Notes (Signed)
EPIC Encounter for ICM Monitoring  Patient Name: Melissa Paul is a 72 y.o. female Date: 09/16/2018 Primary Care Physican: Sharion Balloon, FNP Primary Cardiologist:[place] Electrophysiologist: Allred Bi-V Pacing: 85% BaselineWeight: 172-174 lbs Last Weight: 187.7 lbs                                                             Transmission reviewed. AV optimization done by [name], PA on 8/11 and has follow up on 9/11.  CorVueThoracic impedance returned to normal since 09/02/2018 transmission. Impedance suggeste possible fluid accumulation since 08/27/2018 - 09/04/2018.  Prescribed:Furosemide20 mgTake 4 tablets (80 mg total) by mouth every morning.May take 40 mg in the afternoon as needed if weight is over 5 lb gain.  Labs: 08/19/2018 Creatinine 1.09, BUN 15, Potassium 4.4, Sodium 137 06/03/2018 Creatinine 0.92, BUN 18, Potassium 3.4, Sodium 133, GFR >60 05/07/2018 Creatinine 0.83, BUN 13, Potassium 4.1, Sodium 141, GFR 70-80  Recommendations: None  Follow-up plan: ICM clinic phone appointment on10/19/2020.OV with [name], PA 9/11, 91 day device clinic remote transmission 9/14, OV with Dr Domenic Polite 9/15 and OV with Dr [name] 10/2.  Copy of ICM check sent to Dr.Allred.    3 month ICM trend: 09/15/2018    1 Year ICM trend:       [name], RN 09/16/2018 9:35 AM

## 2018-09-19 ENCOUNTER — Other Ambulatory Visit (INDEPENDENT_AMBULATORY_CARE_PROVIDER_SITE_OTHER): Payer: Self-pay | Admitting: Internal Medicine

## 2018-10-02 ENCOUNTER — Encounter: Payer: Self-pay | Admitting: Student

## 2018-10-02 ENCOUNTER — Other Ambulatory Visit: Payer: Self-pay

## 2018-10-02 ENCOUNTER — Ambulatory Visit (INDEPENDENT_AMBULATORY_CARE_PROVIDER_SITE_OTHER): Payer: Medicare Other | Admitting: Student

## 2018-10-02 VITALS — BP 102/69 | HR 74 | Ht 62.5 in | Wt 194.0 lb

## 2018-10-02 DIAGNOSIS — I428 Other cardiomyopathies: Secondary | ICD-10-CM

## 2018-10-02 LAB — CUP PACEART INCLINIC DEVICE CHECK
Battery Remaining Longevity: 51 mo
Brady Statistic RA Percent Paced: 78 %
Brady Statistic RV Percent Paced: 84 %
Date Time Interrogation Session: 20200911110849
HighPow Impedance: 82.125
Implantable Lead Implant Date: 20110912
Implantable Lead Implant Date: 20110912
Implantable Lead Implant Date: 20120710
Implantable Lead Location: 753858
Implantable Lead Location: 753859
Implantable Lead Location: 753860
Implantable Lead Model: 7122
Implantable Pulse Generator Implant Date: 20180621
Lead Channel Impedance Value: 350 Ohm
Lead Channel Impedance Value: 512.5 Ohm
Lead Channel Impedance Value: 787.5 Ohm
Lead Channel Pacing Threshold Amplitude: 0.75 V
Lead Channel Pacing Threshold Amplitude: 0.75 V
Lead Channel Pacing Threshold Amplitude: 0.75 V
Lead Channel Pacing Threshold Amplitude: 0.75 V
Lead Channel Pacing Threshold Amplitude: 0.75 V
Lead Channel Pacing Threshold Pulse Width: 0.5 ms
Lead Channel Pacing Threshold Pulse Width: 0.5 ms
Lead Channel Pacing Threshold Pulse Width: 0.5 ms
Lead Channel Pacing Threshold Pulse Width: 0.5 ms
Lead Channel Pacing Threshold Pulse Width: 0.5 ms
Lead Channel Sensing Intrinsic Amplitude: 12 mV
Lead Channel Sensing Intrinsic Amplitude: 3.6 mV
Lead Channel Setting Pacing Amplitude: 2 V
Lead Channel Setting Pacing Amplitude: 2 V
Lead Channel Setting Pacing Amplitude: 2 V
Lead Channel Setting Pacing Pulse Width: 0.5 ms
Lead Channel Setting Pacing Pulse Width: 0.5 ms
Lead Channel Setting Sensing Sensitivity: 0.5 mV
Pulse Gen Serial Number: 9761374

## 2018-10-02 NOTE — Patient Instructions (Addendum)
Medication Instructions:   FOR 2 DAYS ONLY  LASIX 80 MG TWICE A DAY THEN RESUME NORMAL DOSE  LASIX 80 MG ONCE A DAY    If you need a refill on your cardiac medications before your next appointment, please call your pharmacy.   Lab work:NONE ORDERED  TODAY   If you have labs (blood work) drawn today and your tests are completely normal, you will receive your results only by: Marland Kitchen MyChart Message (if you have MyChart) OR . A paper copy in the mail If you have any lab test that is abnormal or we need to change your treatment, we will call you to review the results.  Testing/Procedures: NONE ORDERED  TODAY  Follow-Up:   AS  SCHEDULED   Any Other Special Instructions Will Be Listed Below (If Applicable).

## 2018-10-02 NOTE — Progress Notes (Signed)
Electrophysiology Office Note Date: 10/02/2018  ID:  Melissa Paul, DOB Oct 12, 1945, MRN [id number]  PCP: Sharion Balloon, FNP Primary Cardiologist: Rozann Lesches, MD Electrophysiologist: Thompson Grayer, MD  CC: Routine ICD follow-up  Melissa Paul is a 73 y.o. female seen today for Dr. [name].  They present today for routine electrophysiology followup.  Since last being seen in our clinic, the patient reports DOE and orthopnea.  She is SOB with mild to moderate exertion. She denies chest pain or palpitations.  The past several night she has had PND. She drinks > 64 oz a day, at the direction of her nephrologist (Cr ranges 0.8 - 1.0). She uses minimal table salt. She has not had ICD shocks.   Device History: St. Jude BiV ICD implanted 09/2009, gen change 06/2016 History of appropriate therapy: No History of AAD therapy: No   Past Medical History:  Diagnosis Date  . Chronic systolic heart failure ([place])   . Glucose intolerance (pre-diabetes)   . Hyperlipemia   . Hypothyroidism   . LBBB (left bundle branch block)   . Mitral regurgitation    Mild to Moderate  . Nonischemic cardiomyopathy ([place])    LVEF 35-40% by echo 8/13  . Paroxysmal atrial fibrillation ([facility])    Brief episodes by device interrogation  . UTI (urinary tract infection)    Recurrent  . Ventricular tachycardia ([place])    ICD therapy for VT   Past Surgical History:  Procedure Laterality Date  . APPENDECTOMY    . BIV ICD GENERATOR CHANGEOUT N/A 07/11/2016   Procedure: BiV ICD Generator Changeout;  Surgeon: Thompson Grayer, MD;  Location: [facility];  Service: Cardiovascular;  Laterality: N/A;  . CARDIAC DEFIBRILLATOR PLACEMENT  09/2009   St. Jude BiV ICD by Dr. Sandria Manly class III  . CHOLECYSTECTOMY    . has one ovary left    . KNEE ARTHROSCOPY     Left  . LEFT AND RIGHT HEART CATHETERIZATION WITH CORONARY ANGIOGRAM N/A 04/12/2014   Procedure: LEFT AND RIGHT HEART CATHETERIZATION WITH CORONARY  ANGIOGRAM;  Surgeon: Leonie Man, MD;  Location: [facility] CATH [facility];  Service: Cardiovascular;  Laterality: N/A;  . Left breast biopsy x 2     no maliganancy  . TOTAL ABDOMINAL HYSTERECTOMY     (R) ovary removed    Current Outpatient Medications  Medication Sig Dispense Refill  . acetaminophen (TYLENOL) 500 MG tablet Take 500 mg by mouth every 6 (six) hours as needed for headache.    . albuterol (PROVENTIL) (2.5 MG/3ML) 0.083% nebulizer solution Take 3 mLs (2.5 mg total) by nebulization every 6 (six) hours as needed for wheezing or shortness of breath. 150 mL 1  . aspirin EC 81 MG tablet Take 81 mg by mouth daily.    . busPIRone (BUSPAR) 5 MG tablet Take 1 tablet (5 mg total) by mouth 3 (three) times daily as needed. 60 tablet 1  . carvedilol (COREG) 6.25 MG tablet Take 3 tablets (18.75 mg total) by mouth 2 (two) times daily. 180 tablet 3  . escitalopram (LEXAPRO) 5 MG tablet Take 1 tablet (5 mg total) by mouth daily. 90 tablet 1  . estradiol (ESTRACE) 0.1 MG/GM vaginal cream Place 1 Applicatorful vaginally at bedtime.    . furosemide (LASIX) 80 MG tablet Take 80 mg by mouth daily. May take extra 40 mg in pm if wt is over 5 lbs gain    . levothyroxine (SYNTHROID) 50 MCG tablet Take 1 tablet (50 mcg  total) by mouth daily. 90 tablet 1  . metFORMIN (GLUCOPHAGE) 500 MG tablet Take 1 tablet by mouth twice daily 180 tablet 0  . pantoprazole (PROTONIX) 40 MG tablet TAKE 1 TABLET BY MOUTH ONCE DAILY BEFORE BREAKFAST 30 tablet 0  . spironolactone (ALDACTONE) 25 MG tablet Take 1 tablet by mouth once daily 90 tablet 1   No current facility-administered medications for this visit.     Allergies:   Bactrim, Cephalexin, Chlorhexidine gluconate, Clindamycin, Contrast media [iodinated diagnostic agents], Doxycycline, Latex, Levofloxacin, Nitrofurantoin monohyd macro, and Penicillins   Social History: Social History   Socioeconomic History  . Marital status: Widowed    Spouse name: Not on file  . Number  of children: Not on file  . Years of education: Not on file  . Highest education level: Not on file  Occupational History  . Not on file  Social Needs  . Financial resource strain: Not on file  . Food insecurity    Worry: Not on file    Inability: Not on file  . Transportation needs    Medical: Not on file    Non-medical: Not on file  Tobacco Use  . Smoking status: Former Smoker    Packs/day: 0.30    Years: 20.00    Pack years: 6.00    Types: Cigarettes    Start date: 08/03/1949    Quit date: 01/22/1996    Years since quitting: 22.7  . Smokeless tobacco: Never Used  Substance and Sexual Activity  . Alcohol use: No    Alcohol/week: 0.0 standard drinks  . Drug use: No  . Sexual activity: Not on file  Lifestyle  . Physical activity    Days per week: Not on file    Minutes per session: Not on file  . Stress: Not on file  Relationships  . Social Herbalist on phone: Not on file    Gets together: Not on file    Attends religious service: Not on file    Active member of club or organization: Not on file    Attends meetings of clubs or organizations: Not on file    Relationship status: Not on file  . Intimate partner violence    Fear of current or ex partner: Not on file    Emotionally abused: Not on file    Physically abused: Not on file    Forced sexual activity: Not on file  Other Topics Concern  . Not on file  Social History Narrative  . Not on file    Family History: Family History  Problem Relation Age of Onset  . CVA Mother        multiple  . Heart Problems Mother        "weak heart"  . Heart disease Maternal Grandmother   . Heart disease Maternal Grandfather   . Cancer Father        Stomach and eshpogus  . Diabetes Neg Hx   . Hypertension Neg Hx   . Coronary artery disease Neg Hx     Review of Systems: All other systems reviewed and are otherwise negative except as noted above.  Physical Exam: Vitals:   10/02/18 1001  BP: 102/69   Pulse: 74  Weight: 194 lb (88 kg)  Height: 5' 2.5" (1.588 m)     GEN- The patient is well appearing, alert and oriented x 3 today.   HEENT: normocephalic, atraumatic; sclera clear, conjunctiva pink; hearing intact; oropharynx clear; neck supple, JVP difficult  due to body habitus, but appears at least 8-9 cm.  Lymph- no cervical lymphadenopathy Lungs- Mildly diminished basilar sounds. Normal work of breathing.  No wheezes, rales, rhonchi Heart- Regular rate and rhythm, no murmurs, rubs or gallops, PMI not laterally displaced GI- Obese, soft, non-tender, non-distended, bowel sounds present, no hepatosplenomegaly Extremities- no clubbing or cyanosis. Soft 1-2+ edema 1/2 to knees.  DP/PT/radial pulses 2+ bilaterally MS- no significant deformity or atrophy Skin- warm and dry, no rash or lesion; ICD pocket well healed Psych- euthymic mood, full affect Neuro- strength and sensation are intact  ICD interrogation- reviewed in detail today,  See PACEART report  EKG:  EKG is ordered today. The ekg ordered today shows A-V dual paced rhythm ~ 70 bpm (Double counted T waves on EKG auto interpretation)  Recent Labs: 05/07/2018: ALT 6; Hemoglobin 13.0; Platelets 277; TSH 0.084 08/19/2018: BUN 15; Creat 1.09; Potassium 4.4; Sodium 137   Wt Readings from Last 3 Encounters:  10/02/18 194 lb (88 kg)  09/15/18 195 lb 3.2 oz (88.5 kg)  08/24/18 186 lb (84.4 kg)     Other studies Reviewed: Additional studies/ records that were reviewed today include: AV opt echo,    Assessment and Plan:  1.  Chronic systolic dysfunction/ICM She is volume overloaded on exam today, with NYHA III symptoms, orthoponea, and PND.  Instructed to take lasix 80 mg BID x 2 days, and then continue lasix 80 mg daily with extra 40 mg as needed. She would be an excellent candidate for torsemide, but refuses due to cost.  Discussed salt and fluid restriction.  Continue coreg 18.75 mg BID. Would not increase today with pending  diuresis and borderline hypotension. Continue spiro 25 mg daily. Intolerant to Ace/Arb with labile BP  2. H/o VF s/p St Jude CRT-D No recent episodes. Normal ICD function See Claudia Desanctis Art report No changes today EF 25-30% 09/01/2018 with AV opt. Could consider V-V optimization once volume improved, vs increase LRL to suppress PVCs (She was previously programmed to LRL of 80 until 03/26/2017, but rate response was felt to be too aggressive at times) Pt refuses referal to HF clinic today due to facility fee. She states she cannot afford to see another doctor at this time. I will reach out and ask if they have a procedure for waving this fee for patients.   3. PAF No recent true episodes of AF Noise previously noted on atrial lead [id number] is 3. She has declined anticoagulation.  Current medicines are reviewed at length with the patient today.   The patient does not have concerns regarding her medicines.  The following changes were made today:  none  Labs/ tests ordered today include:  Orders Placed This Encounter  Procedures  . CUP PACEART [place]  . EKG 12-Lead    Disposition:   Follow up with Dr. Domenic Polite next week as scheduled. Follow up with Dr. [name] next month as scheduled.   Jacalyn Lefevre, PA-C  10/02/2018 12:22 PM  [facility] [place] [address] [phone] (office) [phone] (fax)

## 2018-10-02 NOTE — Progress Notes (Signed)
Thank you :)

## 2018-10-05 ENCOUNTER — Ambulatory Visit (INDEPENDENT_AMBULATORY_CARE_PROVIDER_SITE_OTHER): Payer: Medicare Other | Admitting: *Deleted

## 2018-10-05 ENCOUNTER — Telehealth (HOSPITAL_COMMUNITY): Payer: Self-pay | Admitting: Licensed Clinical Social Worker

## 2018-10-05 DIAGNOSIS — I428 Other cardiomyopathies: Secondary | ICD-10-CM | POA: Diagnosis not present

## 2018-10-05 NOTE — Telephone Encounter (Signed)
CSW consulted to assist with medication cost concerns-provider wanting to switch pt from furosimide to toresimide.  CSW called pt to discuss.  Pt states she has trouble affording her medications- unable to name the particular prescriptions she struggles with but says they add up and some are over $20 every month.  She has no prescription coverage and [facility] all [facility]- she gets them through the [facility] list and Goodrx card.  Her furosimide is currently $10 for a 90day supply and the toresimide would be $24 for 90 days.  She wouldn't commit if she is willing to do this because her bills change every month so shes worried she wouldn't be able to afford the new medication every month. CSW discuss possibility of receiving some assistance in the form of a [facility] gift card to assist with medication costs but pt still not ready to commit to medication change.  The only medications I could find assistance programs for was lexapro ($38 for 90 days) and protonix ($17.63 for 30 days) which are her most expensive medications.  I am sending her an application for the lexapro assistance but she says she might want to stop this anyway as it has made her very hyper.  I also provided her a number for [employer] savings program which would supposedly reduce the cost of her protonix but unsure how much.   I also provided her with the number for [employer] to sign up for Medicare Part D and to apply for the assistance programs to help with premium coverage for a prescription plan and with prescription costs.  CSW will continue to follow and assist as needed  Jorge Ny, [facility] Desk#: [phone] Cell#: [phone]

## 2018-10-06 ENCOUNTER — Telehealth (INDEPENDENT_AMBULATORY_CARE_PROVIDER_SITE_OTHER): Payer: Medicare Other | Admitting: Cardiology

## 2018-10-06 ENCOUNTER — Ambulatory Visit (INDEPENDENT_AMBULATORY_CARE_PROVIDER_SITE_OTHER): Payer: Medicare Other | Admitting: Nurse Practitioner

## 2018-10-06 ENCOUNTER — Encounter: Payer: Self-pay | Admitting: Cardiology

## 2018-10-06 VITALS — BP 110/71 | HR 72 | Ht 62.5 in | Wt 190.5 lb

## 2018-10-06 DIAGNOSIS — I480 Paroxysmal atrial fibrillation: Secondary | ICD-10-CM | POA: Diagnosis not present

## 2018-10-06 DIAGNOSIS — I428 Other cardiomyopathies: Secondary | ICD-10-CM

## 2018-10-06 DIAGNOSIS — I5042 Chronic combined systolic (congestive) and diastolic (congestive) heart failure: Secondary | ICD-10-CM

## 2018-10-06 DIAGNOSIS — Z95810 Presence of automatic (implantable) cardiac defibrillator: Secondary | ICD-10-CM

## 2018-10-06 LAB — CUP PACEART REMOTE DEVICE CHECK
Battery Remaining Longevity: 52 mo
Battery Remaining Percentage: 66 %
Battery Voltage: 2.93 V
Brady Statistic AP VP Percent: 83 %
Brady Statistic AP VS Percent: 6.2 %
Brady Statistic AS VP Percent: 1.4 %
Brady Statistic AS VS Percent: 1 %
Brady Statistic RA Percent Paced: 78 %
Date Time Interrogation Session: 20200915151701
HighPow Impedance: 74 Ohm
HighPow Impedance: 74 Ohm
Implantable Lead Implant Date: 20110912
Implantable Lead Implant Date: 20110912
Implantable Lead Implant Date: 20120710
Implantable Lead Location: 753858
Implantable Lead Location: 753859
Implantable Lead Location: 753860
Implantable Lead Model: 7122
Implantable Pulse Generator Implant Date: 20180621
Lead Channel Impedance Value: 350 Ohm
Lead Channel Impedance Value: 530 Ohm
Lead Channel Impedance Value: 610 Ohm
Lead Channel Pacing Threshold Amplitude: 0.75 V
Lead Channel Pacing Threshold Amplitude: 0.75 V
Lead Channel Pacing Threshold Amplitude: 0.75 V
Lead Channel Pacing Threshold Pulse Width: 0.5 ms
Lead Channel Pacing Threshold Pulse Width: 0.5 ms
Lead Channel Pacing Threshold Pulse Width: 0.5 ms
Lead Channel Sensing Intrinsic Amplitude: 12 mV
Lead Channel Sensing Intrinsic Amplitude: 2.2 mV
Lead Channel Setting Pacing Amplitude: 2 V
Lead Channel Setting Pacing Amplitude: 2 V
Lead Channel Setting Pacing Amplitude: 2 V
Lead Channel Setting Pacing Pulse Width: 0.5 ms
Lead Channel Setting Pacing Pulse Width: 0.5 ms
Lead Channel Setting Sensing Sensitivity: 0.5 mV
Pulse Gen Serial Number: 9761374

## 2018-10-06 MED ORDER — FUROSEMIDE 80 MG PO TABS: 80.0000 mg | ORAL_TABLET | ORAL | 1 refills | Status: DC

## 2018-10-06 NOTE — Patient Instructions (Addendum)
Medication Instructions:   Your physician has recommended you make the following change in your medication:   Increase furosemide to 80 mg in the morning and 40 mg in the afternoon  Continue all other medications the same  Labwork:  Your physician recommends that you return for lab work in: 10 days to check your BMET. Please have this done at [facility] or [facility] in [place]. Your lab order has been faxed to their facility. You do not have to fast for this lab work  Testing/Procedures:  NONE  Follow-Up:  Your physician recommends that you schedule a follow-up appointment in: 6 weeks. This may be an office visit or virtual visit.   Any Other Special Instructions Will Be Listed Below (If Applicable).  If you need a refill on your cardiac medications before your next appointment, please call your pharmacy.

## 2018-10-06 NOTE — Progress Notes (Signed)
Virtual Visit via Telephone Note   This visit type was conducted due to national recommendations for restrictions regarding the COVID-19 Pandemic (e.g. social distancing) in an effort to limit this patient's exposure and mitigate transmission in our community.  Due to her co-morbid illnesses, this patient is at least at moderate risk for complications without adequate follow up.  This format is felt to be most appropriate for this patient at this time.  The patient did not have access to video technology/had technical difficulties with video requiring transitioning to audio format only (telephone).  All issues noted in this document were discussed and addressed.  No physical exam could be performed with this format.  Please refer to the patient's chart for her  consent to telehealth for [facility].   Date:  10/06/2018   ID:  Melissa Paul, DOB [birth year], MRN [id number]  Patient Location: Home Provider Location: Office  PCP:  Sharion Balloon, FNP  Cardiologist:  Rozann Lesches, MD Electrophysiologist:  Thompson Grayer, MD   Evaluation Performed:  Follow-Up Visit  Chief Complaint:   Cardiac follow-up  History of Present Illness:    Melissa Paul is a 73 y.o. female last assessed via telehealth encounter in August.  We spoke by phone today.  She follows with Dr. [name], St. Jude biventricular ICD in place.  She just had a recent device clinic visit with Mr. Sula Rumple.  I note that Lasix was temporarily increased due to concerns about volume overload.  Device interrogation indicated 1.9% PAF, biventricular pacing at 84%.  She tells me that she took Lasix 80 mg twice daily for 2 days, has lost about 5 pounds and feels somewhat better.  I reviewed her medications and we discussed increasing her standing Lasix dose with follow-up on renal function.  She still has significant concerns about being able to afford switching to [facility], Social Work note reviewed in the chart.  Recent AV  optimization echocardiogram was performed in August.  The patient does not have symptoms concerning for COVID-19 infection (fever, chills, cough, or new shortness of breath).    Past Medical History:  Diagnosis Date   Chronic systolic heart failure (HCC)    Glucose intolerance (pre-diabetes)    Hyperlipemia    Hypothyroidism    LBBB (left bundle branch block)    Mitral regurgitation    Mild to Moderate   Nonischemic cardiomyopathy (HCC)    LVEF 35-40% by echo 8/13   Paroxysmal atrial fibrillation (HCC)    Brief episodes by device interrogation   UTI (urinary tract infection)    Recurrent   Ventricular tachycardia ([place])    ICD therapy for VT   Past Surgical History:  Procedure Laterality Date   APPENDECTOMY     BIV ICD GENERATOR CHANGEOUT N/A 07/11/2016   Procedure: BiV ICD Generator Changeout;  Surgeon: Thompson Grayer, MD;  Location: [facility];  Service: Cardiovascular;  Laterality: N/A;   CARDIAC DEFIBRILLATOR PLACEMENT  09/2009   St. Jude BiV ICD by Dr. Sandria Manly class III   CHOLECYSTECTOMY     has one ovary left     KNEE ARTHROSCOPY     Left   LEFT AND RIGHT HEART CATHETERIZATION WITH CORONARY ANGIOGRAM N/A 04/12/2014   Procedure: LEFT AND RIGHT HEART CATHETERIZATION WITH CORONARY ANGIOGRAM;  Surgeon: Leonie Man, MD;  Location: [facility] CATH LAB;  Service: Cardiovascular;  Laterality: N/A;   Left breast biopsy x 2     no maliganancy   TOTAL ABDOMINAL HYSTERECTOMY     (  R) ovary removed     Current Meds  Medication Sig   acetaminophen (TYLENOL) 500 MG tablet Take 500 mg by mouth every 6 (six) hours as needed for headache.   albuterol (PROVENTIL) (2.5 MG/3ML) 0.083% nebulizer solution Take 3 mLs (2.5 mg total) by nebulization every 6 (six) hours as needed for wheezing or shortness of breath.   aspirin EC 81 MG tablet Take 81 mg by mouth daily.   busPIRone (BUSPAR) 5 MG tablet Take 1 tablet (5 mg total) by mouth 3 (three) times daily as  needed.   carvedilol (COREG) 6.25 MG tablet Take 3 tablets (18.75 mg total) by mouth 2 (two) times daily.   escitalopram (LEXAPRO) 5 MG tablet Take 1 tablet (5 mg total) by mouth daily.   estradiol (ESTRACE) 0.1 MG/GM vaginal cream Place 1 Applicatorful vaginally at bedtime.   furosemide (LASIX) 80 MG tablet Take 1 tablet (80 mg total) by mouth every morning. & 40 mg in the afternoon   levothyroxine (SYNTHROID) 50 MCG tablet Take 1 tablet (50 mcg total) by mouth daily.   metFORMIN (GLUCOPHAGE) 500 MG tablet Take 1 tablet by mouth twice daily   pantoprazole (PROTONIX) 40 MG tablet TAKE 1 TABLET BY MOUTH ONCE DAILY BEFORE BREAKFAST   spironolactone (ALDACTONE) 25 MG tablet Take 1 tablet by mouth once daily   [DISCONTINUED] furosemide (LASIX) 80 MG tablet Take 80 mg by mouth daily. May take extra 40 mg in pm if wt is over 5 lbs gain     Allergies:   Bactrim, Cephalexin, Chlorhexidine gluconate, Clindamycin, Contrast media [iodinated diagnostic agents], Doxycycline, Latex, Levofloxacin, Nitrofurantoin monohyd macro, and Penicillins   Social History   Tobacco Use   Smoking status: Former Smoker    Packs/day: 0.30    Years: 20.00    Pack years: 6.00    Types: Cigarettes    Start date: 08/03/1949    Quit date: 01/22/1996    Years since quitting: 22.7   Smokeless tobacco: Never Used  Substance Use Topics   Alcohol use: No    Alcohol/week: 0.0 standard drinks   Drug use: No     Family Hx: The patient's family history includes CVA in her mother; Cancer in her father; Heart Problems in her mother; Heart disease in her maternal grandfather and maternal grandmother. There is no history of Diabetes, Hypertension, or Coronary artery disease.  ROS:   Please see the history of present illness. All other systems reviewed and are negative.   Prior CV studies:   The following studies were reviewed today:  Echocardiogram with AV optimization 09/01/2018:  1. The left ventricle has  severely reduced systolic function, with an ejection fraction of 25-30%. The cavity size was severely dilated. Left ventricular diastolic Doppler parameters are consistent with pseudonormalization. Elevated left ventricular  end-diastolic pressure Left ventricular diffuse hypokinesis.  2. The right ventricle has normal systolic function. The cavity was normal. There is no increase in right ventricular wall thickness. Right ventricular systolic pressure is normal.  3. Left atrial size was mildly dilated.  4. The aortic valve is tricuspid. Aortic valve regurgitation is trivial by color flow Doppler.  5. The aorta is normal in size and structure.  Labs/Other Tests and Data Reviewed:    EKG:  An ECG dated 02/28/2017 was personally reviewed today and demonstrated:  Dual-chamber paced rhythm.  Recent Labs: 05/07/2018: ALT 6; Hemoglobin 13.0; Platelets 277; TSH 0.084 08/19/2018: BUN 15; Creat 1.09; Potassium 4.4; Sodium 137   Recent Lipid Panel Lab Results  Component Value Date/Time   CHOL 177 08/11/2017 10:19 AM   TRIG 108 08/11/2017 10:19 AM   HDL 50 08/11/2017 10:19 AM   CHOLHDL 3.5 08/11/2017 10:19 AM   CHOLHDL 3.3 04/11/2007 06:00 AM   LDLCALC 105 (H) 08/11/2017 10:19 AM    Wt Readings from Last 3 Encounters:  10/06/18 190 lb 8 oz (86.4 kg)  10/02/18 194 lb (88 kg)  09/15/18 195 lb 3.2 oz (88.5 kg)     Objective:    Vital Signs:  BP 110/71    Pulse 72    Ht 5' 2.5" (1.588 m)    Wt 190 lb 8 oz (86.4 kg)    BMI 34.29 kg/m    Patient spoke in full sentences, not short of breath. No audible wheezing or coughing. Speech pattern normal.  ASSESSMENT & PLAN:    1.  Chronic combined heart failure.  Weight is down 5 pounds after temporary doubling of Lasix as discussed above.  Plan is to increase standing Lasix dose to 80 mg in the morning and 40 mg in the afternoon with follow-up BMET in 10 days.  She states that she cannot afford switching from Lasix to Demadex.  2.  Nonischemic  cardiomyopathy, recent LVEF 25 to 30%.  She underwent AV optimization recently in August per Dr. [name].  She remains on Coreg, Lasix with potassium supplements, and Aldactone.  She is prone to orthostatic dizziness and runs a low normal blood pressure.  We have not pursued addition of low-dose ARB or Entresto due to this, she also has concerns about medication costs as well.  3.  St. Jude biventricular ICD in place.  Keep follow-up with Dr. [name].  No recent device shocks.  4.  Paroxysmal atrial fibrillation based on device interrogation, no palpitations.  She has low arrhythmia burden and declines anticoagulation.  COVID-19 Education: The signs and symptoms of COVID-19 were discussed with the patient and how to seek care for testing (follow up with PCP or arrange E-visit).  The importance of social distancing was discussed today.  Time:   Today, I have spent 5 minutes with the patient with telehealth technology discussing the above problems.     Medication Adjustments/Labs and Tests Ordered: Current medicines are reviewed at length with the patient today.  Concerns regarding medicines are outlined above.   Tests Ordered: Orders Placed This Encounter  Procedures   Basic metabolic panel    Medication Changes: Meds ordered this encounter  Medications   furosemide (LASIX) 80 MG tablet    Sig: Take 1 tablet (80 mg total) by mouth every morning. & 40 mg in the afternoon    Dispense:  135 tablet    Refill:  1    10/06/2018 dose increase    Follow Up:  Virtual Visit or In Person 6 weeks.  Signed, Rozann Lesches, MD  10/06/2018 8:51 AM    [place]

## 2018-10-08 ENCOUNTER — Ambulatory Visit (INDEPENDENT_AMBULATORY_CARE_PROVIDER_SITE_OTHER): Payer: Medicare Other | Admitting: Nurse Practitioner

## 2018-10-08 ENCOUNTER — Encounter (INDEPENDENT_AMBULATORY_CARE_PROVIDER_SITE_OTHER): Payer: Self-pay | Admitting: Nurse Practitioner

## 2018-10-08 ENCOUNTER — Other Ambulatory Visit: Payer: Self-pay

## 2018-10-08 VITALS — BP 99/57 | HR 72 | Temp 98.2°F | Ht 63.0 in | Wt 193.6 lb

## 2018-10-08 DIAGNOSIS — R1013 Epigastric pain: Secondary | ICD-10-CM

## 2018-10-08 DIAGNOSIS — R112 Nausea with vomiting, unspecified: Secondary | ICD-10-CM | POA: Insufficient documentation

## 2018-10-08 DIAGNOSIS — R1010 Upper abdominal pain, unspecified: Secondary | ICD-10-CM | POA: Insufficient documentation

## 2018-10-08 NOTE — Patient Instructions (Addendum)
1. Contact your primary care provider for a Cologuard stool test   2. Complete the lab order today  3. Pepcid 20mg  one tab by mouth at bed time  4. Small 4  Snack sized meals daily  5. Milk of Magnesia 2 tablespoons by mouth every 3rd night as needed for constipation  6. If your abdominal pain or vomiting worsens to to the local emergency room  7. If your upper abdominal pain and vomiting does not improve you will need an upper endoscopy (EGD) with clearance from your cardiologist

## 2018-10-08 NOTE — Progress Notes (Signed)
Subjective:    Patient ID: Melissa Paul, female    DOB: 1945/12/22, 73 y.o.   MRN: [id number]  HPI Melissa Paul. Esmeralda is a 73 year old female with a past medical history of  DM II, hypothyroid Systolic CHF, nonischemic cardiomyopathy, paroxymal atrial fibrillation, hypothyroidism and ulcers. Past cholecystectomy. She presents today with complains of having abdominal bloat discomfort and constipation. She is having nausea after she eats. She has vomited once daily for the past 3 days. She took Zofran but it made her feel jittery. Increased burping and belching. She is passing a normal soft to hard brown BM twice weekly. Infrequently has diarrhea. No rectal bleeding or melena. She took Linzess 65mcg once daily a year ago without improvement. No weight loss. No fever, sweats or chills. She takes ASA 81mg  daily. No other NSAIDS.Her last colonoscopy was 20+ years ago. Father died from stomach cancer at the age of 75.    She was seen in the office by Dr. Laural Golden 08/2017 with similar complaints of nausea, upper abdominal pain and constipation. An abd/pelvic CT done 08/21/2017 showed:  Two adjacent small epigastric ventral hernias, both containing only fat, without significant change compared to prior exam. Several indeterminate complex cystic lesions in right kidney. Abdomen MRI without and with contrast is recommended for further characterization. Colonic diverticulosis, without radiographic evidence of diverticulitis. Pelvic floor laxity with cystocele.    Current Outpatient Medications on File Prior to Visit  Medication Sig Dispense Refill  . acetaminophen (TYLENOL) 500 MG tablet Take 500 mg by mouth every 6 (six) hours as needed for headache.    . albuterol (PROVENTIL) (2.5 MG/3ML) 0.083% nebulizer solution Take 3 mLs (2.5 mg total) by nebulization every 6 (six) hours as needed for wheezing or shortness of breath. 150 mL 1  . aspirin EC 81 MG tablet Take 81 mg by mouth daily.    . carvedilol  (COREG) 6.25 MG tablet Take 3 tablets (18.75 mg total) by mouth 2 (two) times daily. 180 tablet 3  . estradiol (ESTRACE) 0.1 MG/GM vaginal cream Place 1 Applicatorful vaginally at bedtime.    . furosemide (LASIX) 80 MG tablet Take 1 tablet (80 mg total) by mouth every morning. & 40 mg in the afternoon 135 tablet 1  . levothyroxine (SYNTHROID) 50 MCG tablet Take 1 tablet (50 mcg total) by mouth daily. 90 tablet 1  . metFORMIN (GLUCOPHAGE) 500 MG tablet Take 1 tablet by mouth twice daily 180 tablet 0  . pantoprazole (PROTONIX) 40 MG tablet TAKE 1 TABLET BY MOUTH ONCE DAILY BEFORE BREAKFAST 30 tablet 0  . spironolactone (ALDACTONE) 25 MG tablet Take 1 tablet by mouth once daily 90 tablet 1   No current facility-administered medications on file prior to visit.    Allergies  Allergen Reactions  . Buspar [Buspirone] Other (See Comments)    Patient states that she became hyper and had lots of itching.  Loma Sousa [Escitalopram Oxalate] Other (See Comments)    Patient states that she became hyper and lots of itching  . Bactrim Rash  . Cephalexin Rash    Says she is able to take cefuroxime  . Chlorhexidine Gluconate     Hives, itching after using wipes with last procedure  . Clindamycin Rash       . Contrast Media [Iodinated Diagnostic Agents] Rash  . Doxycycline Rash  . Latex Rash       . Levofloxacin Rash       . Nitrofurantoin Monohyd Macro Rash       .  Penicillins Swelling and Rash    Has patient had a PCN reaction causing immediate rash, facial/tongue/throat swelling, SOB or lightheadedness with hypotension: Yes Has patient had a PCN reaction causing severe rash involving mucus membranes or skin necrosis: Yes Has patient had a PCN reaction that required hospitalization: No Has patient had a PCN reaction occurring within the last 10 years: No Rash/swelled tongue If all of the above answers are "NO", then may proceed with Cephalosporin use.     Review of Systems see HPI, all  other systems reviewed and are negative     Objective:   Physical Exam  BP (!) 99/57   Pulse 72 Comment: apical  Temp 98.2 F (36.8 C) (Oral)   Ht 5\' 3"  (1.6 m)   Wt 193 lb 9.6 oz (87.8 kg)   BMI 34.29 kg/m  General: 73 year old female appears fatigued in no acute distress Eyes: Sclera nonicteric, conjunctiva pink Mouth: Poor dentition Neck: Supple, no lymphadenopathy or thyromegaly Heart: Irregular rhythm, no murmur Lungs: Breath sounds clear throughout Abdomen: Obese abdomen, moderate epigastric tenderness with minimal generalized lower abdominal tenderness without rebound or guarding, the abdomen is soft and non-distended, positive bowel sounds to all 4 quadrants, no HSM Extremities: Left forearm enlarged with lipoma mass, trace edema to the lower extremities Neuro: Alert and oriented x4, no focal deficits Skin: No rash or lesions     Assessment & Plan:   68. 73 year old female with nausea/vomiting, upper abdominal pain and bloat.  Hx of ulcers, + family hx of stomach cancer. Past CCY. -Continue Pantoprazole 40mg  in the am add Pepcid 20mg  at bed time -Patient does not wish to try alternative nausea med -4 small snack size meals -EGD with cardiac clearance  if sx persist or worsen  -CBC, CMP, CRP and lipase level -I discussed scheduling an abd/pelvic CT if her sx persist, await bun/cr level prior to ordering CT. Pt to call me on Monday 9/21 with sx update. -patient to go to the local ER if her abdominal pain worsens   2. Constipation -Milk of Magnesia 1 to 2 tablespoons at bed time as needed -Patient did not wish to try higher dose of Linzess -Advised pt to ask PCP for Cologuard Test

## 2018-10-16 ENCOUNTER — Encounter: Payer: Self-pay | Admitting: Cardiology

## 2018-10-16 ENCOUNTER — Other Ambulatory Visit: Payer: Self-pay

## 2018-10-16 LAB — CBC WITH DIFFERENTIAL/PLATELET
Absolute Monocytes: 479 cells/uL (ref 200–950)
Basophils Absolute: 53 cells/uL (ref 0–200)
Basophils Relative: 0.7 %
Eosinophils Absolute: 76 cells/uL (ref 15–500)
Eosinophils Relative: 1 %
HCT: 37.8 % (ref 35.0–45.0)
Hemoglobin: 13.1 g/dL (ref 11.7–15.5)
Lymphs Abs: 1999 cells/uL (ref 850–3900)
MCH: 30.8 pg (ref 27.0–33.0)
MCHC: 34.7 g/dL (ref 32.0–36.0)
MCV: 88.9 fL (ref 80.0–100.0)
MPV: 10.8 fL (ref 7.5–12.5)
Monocytes Relative: 6.3 %
Neutro Abs: 4993 cells/uL (ref 1500–7800)
Neutrophils Relative %: 65.7 %
Platelets: 248 10*3/uL (ref 140–400)
RBC: 4.25 10*6/uL (ref 3.80–5.10)
RDW: 12.8 % (ref 11.0–15.0)
Total Lymphocyte: 26.3 %
WBC: 7.6 10*3/uL (ref 3.8–10.8)

## 2018-10-16 LAB — COMPLETE METABOLIC PANEL WITH GFR
AG Ratio: 1.9 (calc) (ref 1.0–2.5)
ALT: 6 U/L (ref 6–29)
AST: 12 U/L (ref 10–35)
Albumin: 4.3 g/dL (ref 3.6–5.1)
Alkaline phosphatase (APISO): 72 U/L (ref 37–153)
BUN/Creatinine Ratio: 15 (calc) (ref 6–22)
BUN: 17 mg/dL (ref 7–25)
CO2: 32 mmol/L (ref 20–32)
Calcium: 9.1 mg/dL (ref 8.6–10.4)
Chloride: 99 mmol/L (ref 98–110)
Creat: 1.1 mg/dL — ABNORMAL HIGH (ref 0.60–0.93)
GFR, Est African American: 57 mL/min/{1.73_m2} — ABNORMAL LOW (ref >=60)
GFR, Est Non African American: 49 mL/min/{1.73_m2} — ABNORMAL LOW (ref >=60)
Globulin: 2.3 g/dL (calc) (ref 1.9–3.7)
Glucose, Bld: 136 mg/dL (ref 65–139)
Potassium: 4.2 mmol/L (ref 3.5–5.3)
Sodium: 139 mmol/L (ref 135–146)
Total Bilirubin: 0.6 mg/dL (ref 0.2–1.2)
Total Protein: 6.6 g/dL (ref 6.1–8.1)

## 2018-10-16 LAB — LIPASE: Lipase: 21 U/L (ref 7–60)

## 2018-10-16 LAB — C-REACTIVE PROTEIN: CRP: 9.6 mg/L — ABNORMAL HIGH (ref <8.0)

## 2018-10-16 NOTE — Progress Notes (Signed)
Remote ICD transmission.   

## 2018-10-19 ENCOUNTER — Encounter: Payer: Self-pay | Admitting: Family

## 2018-10-19 ENCOUNTER — Ambulatory Visit (INDEPENDENT_AMBULATORY_CARE_PROVIDER_SITE_OTHER): Payer: Medicare Other | Admitting: Family

## 2018-10-19 ENCOUNTER — Other Ambulatory Visit: Payer: Self-pay

## 2018-10-19 VITALS — BP 105/73 | HR 73 | Temp 97.7°F | Ht 63.0 in | Wt 200.4 lb

## 2018-10-19 DIAGNOSIS — M25561 Pain in right knee: Secondary | ICD-10-CM

## 2018-10-19 DIAGNOSIS — E039 Hypothyroidism, unspecified: Secondary | ICD-10-CM | POA: Diagnosis not present

## 2018-10-19 DIAGNOSIS — M79605 Pain in left leg: Secondary | ICD-10-CM

## 2018-10-19 DIAGNOSIS — M25562 Pain in left knee: Secondary | ICD-10-CM

## 2018-10-19 DIAGNOSIS — I5022 Chronic systolic (congestive) heart failure: Secondary | ICD-10-CM | POA: Diagnosis not present

## 2018-10-19 DIAGNOSIS — F411 Generalized anxiety disorder: Secondary | ICD-10-CM

## 2018-10-19 DIAGNOSIS — E785 Hyperlipidemia, unspecified: Secondary | ICD-10-CM

## 2018-10-19 DIAGNOSIS — M79604 Pain in right leg: Secondary | ICD-10-CM | POA: Diagnosis not present

## 2018-10-19 DIAGNOSIS — G8929 Other chronic pain: Secondary | ICD-10-CM

## 2018-10-19 DIAGNOSIS — E1169 Type 2 diabetes mellitus with other specified complication: Secondary | ICD-10-CM

## 2018-10-19 LAB — BAYER DCA HB A1C WAIVED: HB A1C (BAYER DCA - WAIVED): 6.2 % (ref <7.0)

## 2018-10-19 NOTE — Progress Notes (Signed)
Subjective:    Patient ID: Melissa Paul, female    DOB: 08-22-1945, 73 y.o.   MRN: [id number]  Chief Complaint  Patient presents with  . review lab results    may need a new referral to urology  . Leg Pain    referal to ortopedics   PT presents to the office today to recheck GAD and depression. She states she was unable to take the Lexapro or Buspar because it made her "too nervous". She reports she has never been able to take depression medications.   She is complaining of bilateral calf and thigh intermittent pain that is worse when she walks. She also reports left knee pain that is constant aching pain of 10 out 10. She was told she need a "replacement", but states she never did. She states it has been  years since she saw an Orthopedic.  Leg Pain  The incident occurred more than 1 week ago.  Thyroid Problem Presents for follow-up visit. Symptoms include anxiety and constipation. Patient reports no depressed mood, diaphoresis, diarrhea or fatigue. The symptoms have been stable. Her past medical history is significant for hyperlipidemia.  Anxiety Presents for follow-up visit. Symptoms include excessive worry, irritability and nervous/anxious behavior. Patient reports no depressed mood. Symptoms occur most days. The severity of symptoms is moderate.    Congestive Heart Failure Presents for follow-up visit. Associated symptoms include orthopnea. Pertinent negatives include no edema or fatigue. The symptoms have been stable.  Diabetes She presents for her follow-up diabetic visit. She has type 2 diabetes mellitus. Hypoglycemia symptoms include nervousness/anxiousness. Pertinent negatives for diabetes include no fatigue. Symptoms are stable. Risk factors for coronary artery disease include dyslipidemia, diabetes mellitus, hypertension, sedentary lifestyle and post-menopausal. She is following a generally healthy diet. Her overall blood glucose range is 110-130 mg/dl.  Hyperlipidemia  This is a chronic problem. The current episode started more than 1 year ago. The problem is controlled. Recent lipid tests were reviewed and are normal. Exacerbating diseases include obesity. Associated symptoms include leg pain. Current antihyperlipidemic treatment includes statins. The current treatment provides moderate improvement of lipids. Risk factors for coronary artery disease include dyslipidemia, hypertension, a sedentary lifestyle and post-menopausal.      Review of Systems  Constitutional: Positive for irritability. Negative for diaphoresis and fatigue.  Gastrointestinal: Positive for constipation. Negative for diarrhea.  Psychiatric/Behavioral: The patient is nervous/anxious.   All other systems reviewed and are negative.      Objective:   Physical Exam Vitals signs reviewed.  Constitutional:      General: She is not in acute distress.    Appearance: She is well-developed. She is obese.  HENT:     Head: Normocephalic and atraumatic.     Right Ear: External ear normal.  Eyes:     Pupils: Pupils are equal, round, and reactive to light.  Neck:     Musculoskeletal: Normal range of motion and neck supple.     Thyroid: No thyromegaly.  Cardiovascular:     Rate and Rhythm: Normal rate and regular rhythm.     Heart sounds: Normal heart sounds. No murmur.  Pulmonary:     Effort: Pulmonary effort is normal. No respiratory distress.     Breath sounds: Normal breath sounds. No wheezing.  Abdominal:     General: Bowel sounds are normal. There is no distension.     Palpations: Abdomen is soft.     Tenderness: There is no abdominal tenderness.  Musculoskeletal:  General: Tenderness present.     Comments: Pain in left knee with flexion and extension  Skin:    General: Skin is warm and dry.  Neurological:     Mental Status: She is alert and oriented to person, place, and time.     Cranial Nerves: No cranial nerve deficit.     Deep Tendon Reflexes: Reflexes are normal  and symmetric.  Psychiatric:        Behavior: Behavior normal.        Thought Content: Thought content normal.        Judgment: Judgment normal.       BP 105/73   Pulse 73   Temp 97.7 F (36.5 C) (Temporal)   Ht '5\' 3"'  (1.6 m)   Wt 200 lb 6.4 oz (90.9 kg)   SpO2 98%   BMI 35.50 kg/m      Assessment & Plan:  Melissa Paul comes in today with chief complaint of review lab results (may need a new referral to urology) and Leg Pain (referal to ortopedics)   Diagnosis and orders addressed:  1. Pain in both lower extremities - CMP14+EGFR  2. Chronic pain of both knees - Ambulatory referral to Orthopedic Surgery - CMP14+EGFR  3. Chronic systolic heart failure (HCC) -PT will add her laxix 40 mg in the evening for the next 3 days to see if this helps with her SOB. Keep Cardiologists appt.  - CMP14+EGFR  4. Hypothyroidism, unspecified type - CMP14+EGFR - TSH  5. GAD (generalized anxiety disorder) - CMP14+EGFR  6. Type 2 diabetes mellitus with other specified complication, without long-term current use of insulin (HCC) - CMP14+EGFR - Bayer DCA Hb A1c Waived - Microalbumin / creatinine urine ratio - Lipid panel  7. Hyperlipidemia associated with type 2 diabetes mellitus ([place]) - Lipid panel   Labs pending Health Maintenance reviewed Diet and exercise encouraged  Follow up plan: 6 months    Evelina Dun, FNP

## 2018-10-19 NOTE — Patient Instructions (Signed)

## 2018-10-20 ENCOUNTER — Other Ambulatory Visit: Payer: Self-pay | Admitting: Family

## 2018-10-20 ENCOUNTER — Telehealth: Payer: Self-pay | Admitting: *Deleted

## 2018-10-20 LAB — CMP14+EGFR
ALT: 8 IU/L (ref 0–32)
AST: 12 IU/L (ref 0–40)
Albumin/Globulin Ratio: 1.9 (ref 1.2–2.2)
Albumin: 4.3 g/dL (ref 3.7–4.7)
Alkaline Phosphatase: 85 IU/L (ref 39–117)
BUN/Creatinine Ratio: 12 (ref 12–28)
BUN: 13 mg/dL (ref 8–27)
Bilirubin Total: 0.5 mg/dL (ref 0.0–1.2)
CO2: 27 mmol/L (ref 20–29)
Calcium: 9.2 mg/dL (ref 8.7–10.3)
Chloride: 99 mmol/L (ref 96–106)
Creatinine, Ser: 1.11 mg/dL — ABNORMAL HIGH (ref 0.57–1.00)
GFR calc Af Amer: 56 mL/min/{1.73_m2} — ABNORMAL LOW (ref >59)
GFR calc non Af Amer: 49 mL/min/{1.73_m2} — ABNORMAL LOW (ref >59)
Globulin, Total: 2.3 g/dL (ref 1.5–4.5)
Glucose: 104 mg/dL — ABNORMAL HIGH (ref 65–99)
Potassium: 3.9 mmol/L (ref 3.5–5.2)
Sodium: 140 mmol/L (ref 134–144)
Total Protein: 6.6 g/dL (ref 6.0–8.5)

## 2018-10-20 LAB — LIPID PANEL
Chol/HDL Ratio: 3.5 ratio (ref 0.0–4.4)
Cholesterol, Total: 156 mg/dL (ref 100–199)
HDL: 45 mg/dL (ref >39)
LDL Chol Calc (NIH): 94 mg/dL (ref 0–99)
Triglycerides: 89 mg/dL (ref 0–149)
VLDL Cholesterol Cal: 17 mg/dL (ref 5–40)

## 2018-10-20 LAB — MICROALBUMIN / CREATININE URINE RATIO
Creatinine, Urine: 93.9 mg/dL
Microalb/Creat Ratio: 6 mg/g creat (ref 0–29)
Microalbumin, Urine: 5.7 ug/mL

## 2018-10-20 LAB — TSH: TSH: 8.68 u[IU]/mL — ABNORMAL HIGH (ref 0.450–4.500)

## 2018-10-20 MED ORDER — LEVOTHYROXINE SODIUM 88 MCG PO TABS: 88.0000 ug | ORAL_TABLET | Freq: Every day | ORAL | 1 refills | Status: DC

## 2018-10-20 MED ORDER — ATORVASTATIN CALCIUM 20 MG PO TABS: 20.0000 mg | ORAL_TABLET | Freq: Every day | ORAL | 3 refills | Status: DC

## 2018-10-20 NOTE — Telephone Encounter (Signed)
Patient informed. 

## 2018-10-20 NOTE — Telephone Encounter (Signed)
-----   Message from Satira Sark, MD sent at 10/20/2018  2:18 PM EDT ----- Reviewed, renal function is stable.  Continue with current plan. ----- Message ----- From: Merlene Laughter, RN Sent: 10/20/2018   1:51 PM EDT To: Satira Sark, MD  You request BMET

## 2018-10-23 ENCOUNTER — Encounter: Payer: Self-pay | Admitting: Internal Medicine

## 2018-10-23 ENCOUNTER — Telehealth (INDEPENDENT_AMBULATORY_CARE_PROVIDER_SITE_OTHER): Payer: Medicare Other | Admitting: Internal Medicine

## 2018-10-23 VITALS — BP 107/69 | HR 70 | Ht 62.5 in | Wt 183.4 lb

## 2018-10-23 DIAGNOSIS — I472 Ventricular tachycardia, unspecified: Secondary | ICD-10-CM

## 2018-10-23 DIAGNOSIS — I480 Paroxysmal atrial fibrillation: Secondary | ICD-10-CM | POA: Diagnosis not present

## 2018-10-23 DIAGNOSIS — I5022 Chronic systolic (congestive) heart failure: Secondary | ICD-10-CM

## 2018-10-23 NOTE — Progress Notes (Addendum)
Electrophysiology TeleHealth Note   Due to national recommendations of social distancing due to [place] 19, an audio telehealth visit is felt to be most appropriate for this patient at this time.  Verbal consent was obtained by me for the telehealth visit today.  The patient does not have capability for a virtual visit.  A phone visit is therefore required today.   Date:  10/23/2018   ID:  Melissa Paul, DOB 06/25/1945, MRN [id number]  Location: patient's home  Provider location:  [facility]  Evaluation Performed: Follow-up visit  PCP:  Sharion Balloon, FNP   Electrophysiologist:  Melissa Paul  Chief Complaint:  ICD follow up  History of Present Illness:    ADDALYNE Paul is a 73 y.o. female who presents via telehealth conferencing today.  Since last being seen in our clinic, the patient reports doing relatively well.  She continues with shortness of breath and fatigue.   Today, she denies symptoms of palpitations, chest pain,  dizziness, presyncope, or syncope.  The patient is otherwise without complaint today.  The patient denies symptoms of fevers, chills, cough, or new SOB worrisome for COVID 19.  Past Medical History:  Diagnosis Date  . Chronic systolic heart failure ([place])   . Glucose intolerance (pre-diabetes)   . Hyperlipemia   . Hypothyroidism   . LBBB (left bundle branch block)   . Mitral regurgitation    Mild to Moderate  . Nonischemic cardiomyopathy ([place])    LVEF 35-40% by echo 8/13  . Paroxysmal atrial fibrillation (HCC)    Brief episodes by device interrogation  . UTI (urinary tract infection)    Recurrent  . Ventricular tachycardia ([place])    ICD therapy for VT    Past Surgical History:  Procedure Laterality Date  . APPENDECTOMY    . BIV ICD GENERATOR CHANGEOUT N/A 07/11/2016   Procedure: BiV ICD Generator Changeout;  Surgeon: Thompson Grayer, MD;  Location: [facility];  Service: Cardiovascular;  Laterality: N/A;  . CARDIAC DEFIBRILLATOR  PLACEMENT  09/2009   St. Jude BiV ICD by Melissa. Sandria Manly class III  . CHOLECYSTECTOMY    . has one ovary left    . KNEE ARTHROSCOPY     Left  . LEFT AND RIGHT HEART CATHETERIZATION WITH CORONARY ANGIOGRAM N/A 04/12/2014   Procedure: LEFT AND RIGHT HEART CATHETERIZATION WITH CORONARY ANGIOGRAM;  Surgeon: Leonie Man, MD;  Location: [facility] CATH LAB;  Service: Cardiovascular;  Laterality: N/A;  . Left breast biopsy x 2     no maliganancy  . TOTAL ABDOMINAL HYSTERECTOMY     (R) ovary removed    Current Outpatient Medications  Medication Sig Dispense Refill  . acetaminophen (TYLENOL) 500 MG tablet Take 500 mg by mouth every 6 (six) hours as needed for headache.    . albuterol (PROVENTIL) (2.5 MG/3ML) 0.083% nebulizer solution Take 3 mLs (2.5 mg total) by nebulization every 6 (six) hours as needed for wheezing or shortness of breath. 150 mL 1  . aspirin EC 81 MG tablet Take 81 mg by mouth daily.    Marland Kitchen atorvastatin (LIPITOR) 20 MG tablet Take 1 tablet (20 mg total) by mouth daily. 90 tablet 3  . carvedilol (COREG) 6.25 MG tablet Take 3 tablets (18.75 mg total) by mouth 2 (two) times daily. 180 tablet 3  . estradiol (ESTRACE) 0.1 MG/GM vaginal cream Place 1 Applicatorful vaginally at bedtime.    . furosemide (LASIX) 80 MG tablet Take 1 tablet (80 mg total)  by mouth every morning. & 40 mg in the afternoon 135 tablet 1  . levothyroxine (SYNTHROID) 88 MCG tablet Take 1 tablet (88 mcg total) by mouth daily before breakfast. 90 tablet 1  . metFORMIN (GLUCOPHAGE) 500 MG tablet Take 1 tablet by mouth twice daily 180 tablet 0  . pantoprazole (PROTONIX) 40 MG tablet TAKE 1 TABLET BY MOUTH ONCE DAILY BEFORE BREAKFAST 30 tablet 0  . spironolactone (ALDACTONE) 25 MG tablet Take 1 tablet by mouth once daily 90 tablet 1   No current facility-administered medications for this visit.     Allergies:   Buspar [buspirone], Lexapro [escitalopram oxalate], Bactrim, Cephalexin, Chlorhexidine gluconate, Clindamycin,  Contrast media [iodinated diagnostic agents], Doxycycline, Latex, Levofloxacin, Nitrofurantoin monohyd macro, and Penicillins   Social History:  The patient  reports that she quit smoking about 22 years ago. Her smoking use included cigarettes. She started smoking about 69 years ago. She has a 6.00 pack-year smoking history. She has never used smokeless tobacco. She reports that she does not drink alcohol or use drugs.   Family History:  The patient's  family history includes CVA in her mother; Cancer in her father; Heart Problems in her mother; Heart disease in her maternal grandfather and maternal grandmother.   ROS:  Please see the history of present illness.   All other systems are personally reviewed and negative.    Exam:    Vital Signs:  BP 107/69   Pulse 70   Ht 5' 2.5" (1.588 m)   Wt 183 lb 6.4 oz (83.2 kg)   BMI 33.01 kg/m   Well sounding and appearing, alert and conversant, regular work of breathing   Labs/Other Tests and Data Reviewed:    Recent Labs: 10/15/2018: Hemoglobin 13.1; Platelets 248 10/19/2018: ALT 8; BUN 13; Creatinine, Ser 1.11; Potassium 3.9; Sodium 140; TSH 8.680   Wt Readings from Last 3 Encounters:  10/23/18 183 lb 6.4 oz (83.2 kg)  10/19/18 200 lb 6.4 oz (90.9 kg)  10/08/18 193 lb 9.6 oz (87.8 kg)     Last device remote is reviewed from [place] PDF which reveals normal device function, no arrhythmias    ASSESSMENT & PLAN:    1.  Chronic systolic heart failure Symptoms stable Underwent AV opt echo with frequent PVC's identified, BB uptitrated at that time CRT pacing 84% per recent remote Discussed treatment options today including PVC ablation - she would like to hold off for now. If she decides to proceed, would consider arterial and venous closure devices as she has trouble lying flat 2/2 back pain.  Follow up with EP NP in 3 months in [facility] for EKG optimization  2.  VF No recent recurrence  3.  Paroxysmal atrial fibrillation No recent  recurrence Chronic short noise noted on atrial lead  CHADS2VASC is 3, she declines [place]   Follow-up:  Remotes, EP NP in 3 months    Patient Risk:  after full review of this patients clinical status, I feel that they are at moderate risk at this time.  Today, I have spent 15 minutes with the patient with telehealth technology discussing arrhythmia management .    Melissa Fossa, MD  10/23/2018 10:04 AM     [facility] [address] [place]  [address] [phone] (office) [phone] (fax)

## 2018-10-24 ENCOUNTER — Other Ambulatory Visit (INDEPENDENT_AMBULATORY_CARE_PROVIDER_SITE_OTHER): Payer: Self-pay | Admitting: Internal Medicine

## 2018-10-24 ENCOUNTER — Other Ambulatory Visit: Payer: Self-pay | Admitting: Family

## 2018-10-26 ENCOUNTER — Other Ambulatory Visit: Payer: Self-pay | Admitting: *Deleted

## 2018-10-26 MED ORDER — SPIRONOLACTONE 25 MG PO TABS: 25.0000 mg | ORAL_TABLET | Freq: Every day | ORAL | 1 refills | Status: DC

## 2018-11-09 ENCOUNTER — Ambulatory Visit (INDEPENDENT_AMBULATORY_CARE_PROVIDER_SITE_OTHER): Payer: Medicare Other

## 2018-11-09 DIAGNOSIS — I5022 Chronic systolic (congestive) heart failure: Secondary | ICD-10-CM | POA: Diagnosis not present

## 2018-11-09 DIAGNOSIS — Z95810 Presence of automatic (implantable) cardiac defibrillator: Secondary | ICD-10-CM

## 2018-11-10 ENCOUNTER — Telehealth: Payer: Self-pay

## 2018-11-10 NOTE — Telephone Encounter (Signed)
Left message for patient to remind of missed remote transmission.  

## 2018-11-11 ENCOUNTER — Telehealth: Payer: Self-pay

## 2018-11-11 NOTE — Telephone Encounter (Signed)
Remote ICM transmission received.  Attempted call to patient regarding ICM remote transmission and left detailed message per DPR.  Advised to return call for any fluid symptoms or questions.  

## 2018-11-11 NOTE — Progress Notes (Signed)
EPIC Encounter for ICM Monitoring  Patient Name: Melissa Paul is a 73 y.o. female Date: 11/11/2018 Primary Care Physican: Sharion Balloon, FNP Primary Cardiologist:[place] Electrophysiologist: Allred Bi-V Pacing: 85% 10/2/2020Weight: 183 lbs    Attempted call to patient and unable to reach.  Left detailed message per DPR regarding transmission. Transmission reviewed.   CorVueThoracic impedance normal.  Prescribed:Furosemide20 mgTake 4 tablets (80 mg total) by mouth every morning and 40 mg in the afternoon.  Labs: 10/19/2018 Creatinine 1.11, BUN 13, Potassium 3.9, Sodium 140, GFR 49-56 10/15/2018 Creatinine 1.10, BUN 17, Potassium 4.2, Sodium 139, GFR 49-57 08/19/2018 Creatinine 1.09, BUN 15, Potassium 4.4, Sodium 137 06/03/2018 Creatinine 0.92, BUN 18, Potassium 3.4, Sodium 133, GFR >60 05/07/2018 Creatinine 0.83, BUN 13, Potassium 4.1, Sodium 141, GFR 70-80  Recommendations: Left voice mail with ICM number and encouraged to call if experiencing any fluid symptoms.  Follow-up plan: ICM clinic phone appointment on 12/21/2018.   91 day device clinic remote transmission 01/04/2019.  Office appt 11/19/2018 with Dr. Domenic Polite.    Copy of ICM check sent to Dr. [name].   3 month ICM trend: 11/11/2018    1 Year ICM trend:       [name], RN 11/11/2018 8:44 AM

## 2018-11-14 NOTE — Progress Notes (Signed)
Subjective:    Patient ID: Melissa Paul, female    DOB: February 19, 1945, 73 y.o.   MRN: [id number]  HPI Melissa Paul. Melissa Paul is a 73 year old female with a past medical history of  DM II, hypothyroid Systolic CHF, nonischemic cardiomyopathy, pace make and defib, paroxymal atrial fibrillation she has refused [place], hypothyroidism and ulcers. Past cholecystectomy and appendectomy 1980's. She presents today with complaints of having upper abdominal pain for the past 2 weeks. She complains of having upper abdominal pain for the past few weeks, cannot stand anything tight on her stomach. Abdomen becomes bloated after she eats toast and drinks coffee in the am. Increased belching. No heartburn or dysphagia. Some nausea. No vomiting. She is passing a normal formed brown hard stool a few times weekly. She previously passed a BM once weekly. She eats prunes and greens. She takes a stool softener as needed. She has occasional rectal bleeding, last occurred 3 to 4 days ago after urinating. She saw bright red blood dripping down her leg. She sometimes sees blood on the stool. Occasional rectal cramping discomfort. Her last colonoscopy was 20+ years ago. Father died from stomach cancer at the age of 5.    CBC Latest Ref Rng & Units 10/15/2018 05/07/2018 11/12/2017  WBC 3.8 - 10.8 Thousand/uL 7.6 7.4 7.4  Hemoglobin 11.7 - 15.5 g/dL 13.1 13.0 13.2  Hematocrit 35.0 - 45.0 % 37.8 37.2 37.4  Platelets 140 - 400 Thousand/uL 248 277 316   CMP Latest Ref Rng & Units 10/19/2018 10/15/2018 08/19/2018  Glucose 65 - 99 mg/dL 104(H) 136 142(H)  BUN 8 - 27 mg/dL 13 17 15   Creatinine 0.57 - 1.00 mg/dL 1.11(H) 1.10(H) 1.09(H)  Sodium 134 - 144 mmol/L 140 139 137  Potassium 3.5 - 5.2 mmol/L 3.9 4.2 4.4  Chloride 96 - 106 mmol/L 99 99 99  CO2 20 - 29 mmol/L 27 32 30  Calcium 8.7 - 10.3 mg/dL 9.2 9.1 9.7  Total Protein 6.0 - 8.5 g/dL 6.6 6.6 -  Total Bilirubin 0.0 - 1.2 mg/dL 0.5 0.6 -  Alkaline Phos 39 - 117 IU/L 85 - -  AST 0 -  40 IU/L 12 12 -  ALT 0 - 32 IU/L 8 6 -   Abdominal/pelvic CT 08/22/2017: Two adjacent small epigastric ventral hernias, both containing only fat, without significant change compared to prior exam. Several indeterminate complex cystic lesions in right kidney. Abdomen MRI without and with contrast is recommended for further characterization. Colonic diverticulosis, without radiographic evidence of diverticulitis. Pelvic floor laxity with cystocele.  Current Outpatient Medications on File Prior to Visit  Medication Sig Dispense Refill  . acetaminophen (TYLENOL) 500 MG tablet Take 500 mg by mouth every 6 (six) hours as needed for headache.    . albuterol (PROVENTIL) (2.5 MG/3ML) 0.083% nebulizer solution Take 3 mLs (2.5 mg total) by nebulization every 6 (six) hours as needed for wheezing or shortness of breath. 150 mL 1  . aspirin EC 81 MG tablet Take 81 mg by mouth daily.    Marland Kitchen atorvastatin (LIPITOR) 20 MG tablet Take 1 tablet (20 mg total) by mouth daily. 90 tablet 3  . carvedilol (COREG) 6.25 MG tablet Take 3 tablets (18.75 mg total) by mouth 2 (two) times daily. 180 tablet 3  . estradiol (ESTRACE) 0.1 MG/GM vaginal cream Place 1 Applicatorful vaginally at bedtime.    . furosemide (LASIX) 80 MG tablet Take 1 tablet (80 mg total) by mouth every morning. & 40 mg in  the afternoon 135 tablet 1  . levothyroxine (SYNTHROID) 88 MCG tablet Take 1 tablet (88 mcg total) by mouth daily before breakfast. 90 tablet 1  . metFORMIN (GLUCOPHAGE) 500 MG tablet Take 1 tablet by mouth twice daily 180 tablet 0  . pantoprazole (PROTONIX) 40 MG tablet TAKE 1 TABLET BY MOUTH ONCE DAILY BEFORE BREAKFAST 30 tablet 0  . spironolactone (ALDACTONE) 25 MG tablet Take 1 tablet (25 mg total) by mouth daily. 90 tablet 1   No current facility-administered medications on file prior to visit.    Allergies  Allergen Reactions  . Buspar [Buspirone] Other (See Comments)    Patient states that she became hyper and had  lots of itching.  Loma Sousa [Escitalopram Oxalate] Other (See Comments)    Patient states that she became hyper and lots of itching  . Bactrim Rash  . Cephalexin Rash    Says she is able to take cefuroxime  . Chlorhexidine Gluconate     Hives, itching after using wipes with last procedure  . Clindamycin Rash       . Contrast Media [Iodinated Diagnostic Agents] Rash  . Doxycycline Rash  . Latex Rash       . Levofloxacin Rash       . Nitrofurantoin Monohyd Macro Rash       . Penicillins Swelling and Rash    Has patient had a PCN reaction causing immediate rash, facial/tongue/throat swelling, SOB or lightheadedness with hypotension: Yes Has patient had a PCN reaction causing severe rash involving mucus membranes or skin necrosis: Yes Has patient had a PCN reaction that required hospitalization: No Has patient had a PCN reaction occurring within the last 10 years: No Rash/swelled tongue If all of the above answers are "NO", then may proceed with Cephalosporin use.      Review of Systems See HPI, all other systems reviewed and are negative     Objective:   Physical Exam  BP 92/62   Pulse 82   Temp 98.2 F (36.8 C)   Ht 5\' 2"  (1.575 m)   Wt 198 lb 8 oz (90 kg)   BMI 36.31 kg/m  General: 73 year old female well developed in NAD.  Eyes: Sclera nonicteric, conjunctiva pink Heart: RRR, no murmurs Lungs: clear throughout Abdomen: soft, large RUQ scar, RUQ incisional  hernia extends midline when supine and shoulders lifted off table,  + BS x 4 quads, no HSM Rectal: patient declined exam  Extremities: no edema Neuro: alert and oriented x 4, no focal deficits    Assessment & Plan:   1. Upper abdominal pain. RUQ incisional hernia. Past CCY. -general surgeon consult after abd/pelvic CT completed  2. Rectal bleeding, hx of external hemorrhoids. Patient declines rectal exam and colonoscopy. -Cologuard recommended -Pt to call office if rectal bleeding worsens   3.  Constipation improved   Follow up in the office in 2 to 3 months

## 2018-11-17 ENCOUNTER — Encounter (INDEPENDENT_AMBULATORY_CARE_PROVIDER_SITE_OTHER): Payer: Self-pay | Admitting: Nurse Practitioner

## 2018-11-17 ENCOUNTER — Encounter (INDEPENDENT_AMBULATORY_CARE_PROVIDER_SITE_OTHER): Payer: Self-pay | Admitting: *Deleted

## 2018-11-17 ENCOUNTER — Other Ambulatory Visit: Payer: Self-pay

## 2018-11-17 ENCOUNTER — Ambulatory Visit (INDEPENDENT_AMBULATORY_CARE_PROVIDER_SITE_OTHER): Payer: Medicare Other | Admitting: Nurse Practitioner

## 2018-11-17 VITALS — BP 92/62 | HR 82 | Temp 98.2°F | Ht 62.0 in | Wt 198.5 lb

## 2018-11-17 DIAGNOSIS — K458 Other specified abdominal hernia without obstruction or gangrene: Secondary | ICD-10-CM

## 2018-11-17 DIAGNOSIS — R1010 Upper abdominal pain, unspecified: Secondary | ICD-10-CM

## 2018-11-17 NOTE — Patient Instructions (Signed)
1. Schedule an abdominal/pelvic CT with oral contrast  2. I will send you to see a surgeon after you complete the abdominal/pelvic CT scan  3. Contact your primary physician for a cologuard stool test   4. Follow up in office in 2 to 3 months

## 2018-11-19 ENCOUNTER — Other Ambulatory Visit: Payer: Self-pay

## 2018-11-19 ENCOUNTER — Encounter: Payer: Self-pay | Admitting: Cardiology

## 2018-11-19 ENCOUNTER — Ambulatory Visit (INDEPENDENT_AMBULATORY_CARE_PROVIDER_SITE_OTHER): Payer: Medicare Other | Admitting: Cardiology

## 2018-11-19 VITALS — BP 92/58 | HR 70 | Ht 62.5 in | Wt 203.0 lb

## 2018-11-19 DIAGNOSIS — I5022 Chronic systolic (congestive) heart failure: Secondary | ICD-10-CM | POA: Diagnosis not present

## 2018-11-19 DIAGNOSIS — I428 Other cardiomyopathies: Secondary | ICD-10-CM | POA: Diagnosis not present

## 2018-11-19 DIAGNOSIS — I493 Ventricular premature depolarization: Secondary | ICD-10-CM

## 2018-11-19 MED ORDER — DIGOXIN 125 MCG PO TABS: 0.1250 mg | ORAL_TABLET | Freq: Every day | ORAL | 1 refills | Status: DC

## 2018-11-19 MED ORDER — METOLAZONE 2.5 MG PO TABS: 2.5000 mg | ORAL_TABLET | ORAL | 3 refills | Status: DC

## 2018-11-19 NOTE — Progress Notes (Signed)
Cardiology Office Note  Date: 11/19/2018   ID: Melissa Paul, DOB [birth year], MRN [id number]  PCP:  Sharion Balloon, FNP  Cardiologist:  Rozann Lesches, MD Electrophysiologist:  Thompson Grayer, MD   Chief Complaint  Patient presents with  . Cardiac follow-up    History of Present Illness: Melissa Paul is a 73 y.o. female last assessed via telehealth encounter in September.  She is here for a routine visit.  States that she has gained about 5 pounds recently, no reported change in sodium or fluid intake.  She has been compliant with her medications, we increased Lasix to 80 mg in the morning and 40 mg in the afternoon at the last visit.  As noted previously, she was not able to afford a switch to Demadex.  She continues to follow with Dr. [name] in the device clinic, [place] biventricular ICD in place.  Last device interrogation showed normal function with low AT/AF burden.  She does have frequent PVCs with 84% biventricular pacing, Dr. [name] talked with her about the possibility of a PVC ablation which she is contemplating.  Coreg has been uptitrated, but she runs low blood pressures at baseline which limits therapy.  I reviewed her recent lab work as outlined below.  Past Medical History:  Diagnosis Date  . Chronic systolic heart failure ([place])   . Glucose intolerance (pre-diabetes)   . Hyperlipemia   . Hypothyroidism   . LBBB (left bundle branch block)   . Mitral regurgitation    Mild to Moderate  . Nonischemic cardiomyopathy ([place])    LVEF 35-40% by echo 8/13  . Paroxysmal atrial fibrillation (HCC)    Brief episodes by device interrogation  . UTI (urinary tract infection)    Recurrent  . Ventricular tachycardia ([place])    ICD therapy for VT    Past Surgical History:  Procedure Laterality Date  . APPENDECTOMY    . BIV ICD GENERATOR CHANGEOUT N/A 07/11/2016   Procedure: BiV ICD Generator Changeout;  Surgeon: Thompson Grayer, MD;  Location: [facility] LAB;   Service: Cardiovascular;  Laterality: N/A;  . CARDIAC DEFIBRILLATOR PLACEMENT  09/2009   St. Jude BiV ICD by Dr. Sandria Manly class III  . CHOLECYSTECTOMY    . has one ovary left    . KNEE ARTHROSCOPY     Left  . LEFT AND RIGHT HEART CATHETERIZATION WITH CORONARY ANGIOGRAM N/A 04/12/2014   Procedure: LEFT AND RIGHT HEART CATHETERIZATION WITH CORONARY ANGIOGRAM;  Surgeon: Leonie Man, MD;  Location: [facility] CATH LAB;  Service: Cardiovascular;  Laterality: N/A;  . Left breast biopsy x 2     no maliganancy  . TOTAL ABDOMINAL HYSTERECTOMY     (R) ovary removed    Current Outpatient Medications  Medication Sig Dispense Refill  . acetaminophen (TYLENOL) 500 MG tablet Take 500 mg by mouth every 6 (six) hours as needed for headache.    . albuterol (PROVENTIL) (2.5 MG/3ML) 0.083% nebulizer solution Take 3 mLs (2.5 mg total) by nebulization every 6 (six) hours as needed for wheezing or shortness of breath. 150 mL 1  . aspirin EC 81 MG tablet Take 81 mg by mouth daily.    . carvedilol (COREG) 6.25 MG tablet Take 3 tablets (18.75 mg total) by mouth 2 (two) times daily. 180 tablet 3  . estradiol (ESTRACE) 0.1 MG/GM vaginal cream Place 1 Applicatorful vaginally at bedtime.    . furosemide (LASIX) 80 MG tablet Take 1 tablet (80 mg total) by mouth  every morning. & 40 mg in the afternoon 135 tablet 1  . levothyroxine (SYNTHROID) 88 MCG tablet Take 1 tablet (88 mcg total) by mouth daily before breakfast. 90 tablet 1  . metFORMIN (GLUCOPHAGE) 500 MG tablet Take 1 tablet by mouth twice daily 180 tablet 0  . pantoprazole (PROTONIX) 40 MG tablet TAKE 1 TABLET BY MOUTH ONCE DAILY BEFORE BREAKFAST 30 tablet 0  . spironolactone (ALDACTONE) 25 MG tablet Take 1 tablet (25 mg total) by mouth daily. 90 tablet 1  . digoxin (LANOXIN) 0.125 MG tablet Take 1 tablet (0.125 mg total) by mouth daily. 90 tablet 1  . metolazone (ZAROXOLYN) 2.5 MG tablet Take 1 tablet (2.5 mg total) by mouth 2 (two) times a week. 10 tablet 3    No current facility-administered medications for this visit.    Allergies:  Buspar [buspirone], Lexapro [escitalopram oxalate], Bactrim, Cephalexin, Chlorhexidine gluconate, Clindamycin, Contrast media [iodinated diagnostic agents], Doxycycline, Latex, Levofloxacin, Nitrofurantoin monohyd macro, and Penicillins   Social History: The patient  reports that she quit smoking about 22 years ago. Her smoking use included cigarettes. She started smoking about 69 years ago. She has a 6.00 pack-year smoking history. She has never used smokeless tobacco. She reports that she does not drink alcohol or use drugs.   ROS:  Please see the history of present illness. Otherwise, complete review of systems is positive for fatigue.  All other systems are reviewed and negative.   Physical Exam: VS:  BP (!) 92/58   Pulse 70   Ht 5' 2.5" (1.588 m)   Wt 203 lb (92.1 kg)   SpO2 98%   BMI 36.54 kg/m , BMI Body mass index is 36.54 kg/m.  Wt Readings from Last 3 Encounters:  11/19/18 203 lb (92.1 kg)  11/17/18 198 lb 8 oz (90 kg)  10/23/18 183 lb 6.4 oz (83.2 kg)    General: Patient appears comfortable at rest. HEENT: Conjunctiva and lids normal, wearing a mask. Neck: Supple, no elevated JVP or carotid bruits, no thyromegaly. Lungs: Clear to auscultation, nonlabored breathing at rest. Cardiac: Indistinct PMI, RRR with ectopy, no S3 or significant systolic murmur. Abdomen: Protuberant, nontender, bowel sounds present. Extremities: Chronic appearing leg edema, distal pulses 1-2+. Skin: Warm and dry. Musculoskeletal: No kyphosis. Neuropsychiatric: Alert and oriented x3, affect grossly appropriate.  ECG:  An ECG dated 10/02/2018 was personally reviewed today and demonstrated:  Dual-chamber pacing with PVCs.  Recent Labwork: 10/15/2018: Hemoglobin 13.1; Platelets 248 10/19/2018: ALT 8; AST 12; BUN 13; Creatinine, Ser 1.11; Potassium 3.9; Sodium 140; TSH 8.680     Component Value Date/Time   CHOL 156  10/19/2018 1133   TRIG 89 10/19/2018 1133   HDL 45 10/19/2018 1133   CHOLHDL 3.5 10/19/2018 1133   CHOLHDL 3.3 04/11/2007 0600   VLDL 10 04/11/2007 0600   LDLCALC 94 10/19/2018 1133    Other Studies Reviewed Today:  Echocardiogram with AV optimization 09/01/2018: 1. The left ventricle has severely reduced systolic function, with an ejection fraction of 25-30%. The cavity size was severely dilated. Left ventricular diastolic Doppler parameters are consistent with pseudonormalization. Elevated left ventricular  end-diastolic pressure Left ventricular diffuse hypokinesis. 2. The right ventricle has normal systolic function. The cavity was normal. There is no increase in right ventricular wall thickness. Right ventricular systolic pressure is normal. 3. Left atrial size was mildly dilated. 4. The aortic valve is tricuspid. Aortic valve regurgitation is trivial by color flow Doppler. 5. The aorta is normal in size and structure.  Assessment and Plan:  1.  Chronic combined heart failure with LVEF 25 to 30% by echocardiogram in August.  She has gained about 5 pounds recently.  Plan is to continue Lasix 80 mg in the morning and 40 mg in the afternoon, add metolazone 2.5 mg on Mondays and Thursdays, add Lanoxin 0.125 mg daily.  Follow-up BMET in 2 weeks with clinical visit in a month.  No change in current Coreg dose or Aldactone.  2.  Frequent PVCs.  Dr. [name] has discussed possible PVC ablation with the patient, she is contemplating this.  This may help to improve biventricular pacing, most recently at 84%.  3.  St. Jude biventricular ICD in place.  No device shocks or syncope.  She continues to follow with Dr. [name].  Medication Adjustments/Labs and Tests Ordered: Current medicines are reviewed at length with the patient today.  Concerns regarding medicines are outlined above.   Tests Ordered: Orders Placed This Encounter  Procedures  . Basic Metabolic Panel (BMET)    Medication  Changes: Meds ordered this encounter  Medications  . digoxin (LANOXIN) 0.125 MG tablet    Sig: Take 1 tablet (0.125 mg total) by mouth daily.    Dispense:  90 tablet    Refill:  1    NEW MEDICATION 11/19/2018  . metolazone (ZAROXOLYN) 2.5 MG tablet    Sig: Take 1 tablet (2.5 mg total) by mouth 2 (two) times a week.    Dispense:  10 tablet    Refill:  3    NEW MEDICATION 11/19/2018    Disposition:  Follow up 1 month.  Signed, Satira Sark, MD, [facility] 11/19/2018 12:00 PM    [facility] [place], [place], [place] [address] Phone: [phone]; Fax: [phone]

## 2018-11-19 NOTE — Patient Instructions (Signed)
Your physician recommends that you schedule a follow-up appointment in: [place] physician has recommended you make the following change in your medication:   START METOLAZONE 2.5 MG TWICE WEEKLY ON MONDAYS AND THURSDAYS  START DIGOXIN 0.125 MG DAILY  Your physician recommends that you return for lab work in: 2 WEEKS BMP  Thank you for choosing [facility]!!

## 2018-11-22 ENCOUNTER — Other Ambulatory Visit (INDEPENDENT_AMBULATORY_CARE_PROVIDER_SITE_OTHER): Payer: Self-pay | Admitting: Internal Medicine

## 2018-11-23 ENCOUNTER — Telehealth: Payer: Self-pay | Admitting: Family

## 2018-11-23 MED ORDER — DIAZEPAM 5 MG PO TABS: ORAL_TABLET | ORAL | 0 refills | Status: DC

## 2018-11-23 NOTE — Telephone Encounter (Signed)
Prescription sent to pharmacy. Take 30 mins. Prior to CT scan.

## 2018-11-23 NOTE — Telephone Encounter (Signed)
Patient aware.

## 2018-11-24 ENCOUNTER — Ambulatory Visit (HOSPITAL_COMMUNITY): Payer: Medicare Other

## 2018-12-03 ENCOUNTER — Other Ambulatory Visit: Payer: Self-pay | Admitting: Cardiology

## 2018-12-04 ENCOUNTER — Encounter: Payer: Self-pay | Admitting: Cardiology

## 2018-12-04 ENCOUNTER — Ambulatory Visit (INDEPENDENT_AMBULATORY_CARE_PROVIDER_SITE_OTHER): Payer: Medicare Other | Admitting: Cardiology

## 2018-12-04 ENCOUNTER — Other Ambulatory Visit: Payer: Self-pay

## 2018-12-04 VITALS — BP 140/74 | HR 71 | Temp 97.8°F | Ht 62.5 in | Wt 201.0 lb

## 2018-12-04 DIAGNOSIS — I5022 Chronic systolic (congestive) heart failure: Secondary | ICD-10-CM | POA: Diagnosis not present

## 2018-12-04 DIAGNOSIS — I428 Other cardiomyopathies: Secondary | ICD-10-CM | POA: Diagnosis not present

## 2018-12-04 DIAGNOSIS — I493 Ventricular premature depolarization: Secondary | ICD-10-CM | POA: Diagnosis not present

## 2018-12-04 DIAGNOSIS — Z95810 Presence of automatic (implantable) cardiac defibrillator: Secondary | ICD-10-CM | POA: Diagnosis not present

## 2018-12-04 LAB — BASIC METABOLIC PANEL WITH GFR
BUN/Creatinine Ratio: 13 (calc) (ref 6–22)
BUN: 13 mg/dL (ref 7–25)
CO2: 27 mmol/L (ref 20–32)
Calcium: 9.5 mg/dL (ref 8.6–10.4)
Chloride: 98 mmol/L (ref 98–110)
Creat: 1.04 mg/dL — ABNORMAL HIGH (ref 0.60–0.93)
GFR, Est African American: 61 mL/min/{1.73_m2} (ref >=60)
GFR, Est Non African American: 53 mL/min/{1.73_m2} — ABNORMAL LOW (ref >=60)
Glucose, Bld: 88 mg/dL (ref 65–139)
Potassium: 3.8 mmol/L (ref 3.5–5.3)
Sodium: 138 mmol/L (ref 135–146)

## 2018-12-04 MED ORDER — FUROSEMIDE 80 MG PO TABS: 80.0000 mg | ORAL_TABLET | Freq: Two times a day (BID) | ORAL | 3 refills | Status: DC

## 2018-12-04 MED ORDER — METOLAZONE 2.5 MG PO TABS: ORAL_TABLET | ORAL | 3 refills | Status: DC

## 2018-12-04 NOTE — Addendum Note (Signed)
Addended by: Barbarann Ehlers A on: 12/04/2018 01:34 PM   Modules accepted: Orders

## 2018-12-04 NOTE — Progress Notes (Signed)
Cardiology Office Note  Date: 12/04/2018   ID: Melissa Paul, DOB 1945-10-22, MRN [id number]  PCP:  Sharion Balloon, FNP  Cardiologist:  Rozann Lesches, MD Electrophysiologist:  Thompson Grayer, MD   Chief Complaint  Patient presents with  . Cardiac follow-up    History of Present Illness: Melissa Paul is a 73 y.o. female last seen in October.  She presents for a follow-up visit.  Does not report any major change, perhaps less sense of discomfort in her chest after we started Lanoxin.  Her weight is down a few pounds by our scale.  She complains chronically of feeling fatigued.  She sees Dr. [name] in the device clinic, [place] biventricular ICD in place.  She has had no device shocks or syncope.  She does have frequent PVCs and only 84% biventricular pacing by last check.  We added metolazone twice weekly to her standard diuretic regimen at last visit, also added Lanoxin.  She is due for a follow-up BMET.  She states that she had lab work drawn yesterday, results are still pending.  I am reluctant to make any change in her medications until this is reviewed.   Past Medical History:  Diagnosis Date  . Chronic systolic heart failure ([place])   . Glucose intolerance (pre-diabetes)   . Hyperlipemia   . Hypothyroidism   . LBBB (left bundle branch block)   . Mitral regurgitation    Mild to Moderate  . Nonischemic cardiomyopathy ([place])    LVEF 35-40% by echo 8/13  . Paroxysmal atrial fibrillation ([facility])    Brief episodes by device interrogation  . UTI (urinary tract infection)    Recurrent  . Ventricular tachycardia ([place])    ICD therapy for VT    Past Surgical History:  Procedure Laterality Date  . APPENDECTOMY    . BIV ICD GENERATOR CHANGEOUT N/A 07/11/2016   Procedure: BiV ICD Generator Changeout;  Surgeon: Thompson Grayer, MD;  Location: [facility];  Service: Cardiovascular;  Laterality: N/A;  . CARDIAC DEFIBRILLATOR PLACEMENT  09/2009   St. Jude BiV ICD by Dr.  Sandria Manly class III  . CHOLECYSTECTOMY    . has one ovary left    . KNEE ARTHROSCOPY     Left  . LEFT AND RIGHT HEART CATHETERIZATION WITH CORONARY ANGIOGRAM N/A 04/12/2014   Procedure: LEFT AND RIGHT HEART CATHETERIZATION WITH CORONARY ANGIOGRAM;  Surgeon: Leonie Man, MD;  Location: [facility] CATH [facility];  Service: Cardiovascular;  Laterality: N/A;  . Left breast biopsy x 2     no maliganancy  . TOTAL ABDOMINAL HYSTERECTOMY     (R) ovary removed    Current Outpatient Medications  Medication Sig Dispense Refill  . acetaminophen (TYLENOL) 500 MG tablet Take 500 mg by mouth every 6 (six) hours as needed for headache.    . albuterol (PROVENTIL) (2.5 MG/3ML) 0.083% nebulizer solution Take 3 mLs (2.5 mg total) by nebulization every 6 (six) hours as needed for wheezing or shortness of breath. 150 mL 1  . aspirin EC 81 MG tablet Take 81 mg by mouth daily.    . carvedilol (COREG) 6.25 MG tablet Take 3 tablets (18.75 mg total) by mouth 2 (two) times daily. 180 tablet 3  . diazepam (VALIUM) 5 MG tablet Take 30 mins prior to CT scan. 1 tablet 0  . digoxin (LANOXIN) 0.125 MG tablet Take 1 tablet (0.125 mg total) by mouth daily. 90 tablet 1  . estradiol (ESTRACE) 0.1 MG/GM vaginal cream Place  1 Applicatorful vaginally at bedtime.    . furosemide (LASIX) 80 MG tablet Take 1 tablet (80 mg total) by mouth every morning. & 40 mg in the afternoon 135 tablet 1  . levothyroxine (SYNTHROID) 88 MCG tablet Take 1 tablet (88 mcg total) by mouth daily before breakfast. 90 tablet 1  . metFORMIN (GLUCOPHAGE) 500 MG tablet Take 1 tablet by mouth twice daily 180 tablet 0  . pantoprazole (PROTONIX) 40 MG tablet TAKE 1 TABLET BY MOUTH ONCE DAILY BEFORE BREAKFAST 30 tablet 5  . spironolactone (ALDACTONE) 25 MG tablet Take 1 tablet (25 mg total) by mouth daily. 90 tablet 1  . metolazone (ZAROXOLYN) 2.5 MG tablet Take 1 tablet (2.5 mg total) by mouth 2 (two) times a week. 10 tablet 3   No current facility-administered  medications for this visit.    Allergies:  Buspar [buspirone], Lexapro [escitalopram oxalate], Bactrim, Cephalexin, Chlorhexidine gluconate, Clindamycin, Contrast media [iodinated diagnostic agents], Doxycycline, Latex, Levofloxacin, Nitrofurantoin monohyd macro, and Penicillins   Social History: The patient  reports that she quit smoking about 22 years ago. Her smoking use included cigarettes. She started smoking about 69 years ago. She has a 6.00 pack-year smoking history. She has never used smokeless tobacco. She reports that she does not drink alcohol or use drugs.   ROS:  Please see the history of present illness. Otherwise, complete review of systems is positive for none.  All other systems are reviewed and negative.   Physical Exam: VS:  BP 140/74   Pulse 71   Temp 97.8 F (36.6 C)   Ht 5' 2.5" (1.588 m)   Wt 201 lb (91.2 kg)   SpO2 94%   BMI 36.18 kg/m , BMI Body mass index is 36.18 kg/m.  Wt Readings from Last 3 Encounters:  12/04/18 201 lb (91.2 kg)  11/19/18 203 lb (92.1 kg)  11/17/18 198 lb 8 oz (90 kg)    General: Patient appears comfortable at rest. HEENT: Conjunctiva and lids normal, wearing a mask. Neck: Supple, no elevated JVP or carotid bruits, no thyromegaly. Lungs: Clear to auscultation, nonlabored breathing at rest. Cardiac: Regular rate and rhythm, no S3 or significant systolic murmur. Abdomen: Soft, nontender, bowel sounds present. Extremities: Chronic appearing leg edema, distal pulses 1-2+. Skin: Warm and dry. Musculoskeletal: No kyphosis. Neuropsychiatric: Alert and oriented x3, affect grossly appropriate.  ECG:  An ECG dated 10/02/2018 was personally reviewed today and demonstrated:  Dual-chamber pacing with PVCs.  Recent Labwork: 10/15/2018: Hemoglobin 13.1; Platelets 248 10/19/2018: ALT 8; AST 12; BUN 13; Creatinine, Ser 1.11; Potassium 3.9; Sodium 140; TSH 8.680     Component Value Date/Time   CHOL 156 10/19/2018 1133   TRIG 89 10/19/2018 1133    HDL 45 10/19/2018 1133   CHOLHDL 3.5 10/19/2018 1133   CHOLHDL 3.3 04/11/2007 0600   VLDL 10 04/11/2007 0600   LDLCALC 94 10/19/2018 1133    Other Studies Reviewed Today:  Echocardiogramwith AV optimization 09/01/2018: 1. The left ventricle has severely reduced systolic function, with an ejection fraction of 25-30%. The cavity size was severely dilated. Left ventricular diastolic Doppler parameters are consistent with pseudonormalization. Elevated left ventricular  end-diastolic pressure Left ventricular diffuse hypokinesis. 2. The right ventricle has normal systolic function. The cavity was normal. There is no increase in right ventricular wall thickness. Right ventricular systolic pressure is normal. 3. Left atrial size was mildly dilated. 4. The aortic valve is tricuspid. Aortic valve regurgitation is trivial by color flow Doppler. 5. The aorta is normal  in size and structure.  Assessment and Plan:  1.  Nonischemic cardiomyopathy with chronic combined heart failure and LVEF 25 to 30%.  She continues on Lasix 80 mg in the morning and 40 mg in the afternoon, metolazone 2.5 mg twice weekly, Lanoxin, Coreg, and Aldactone.  Not on ARB with typically low blood pressures, although today's blood pressure is higher than normal.  Her weight is down a few pounds.  Follow-up BMET which is pending prior to further adjustments.  2.  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  We will communicate with Dr. [name] regarding possible PVC ablation which has already been discussed to some degree with the patient.  She seems amenable to this.  3.  St. Jude biventricular ICD in place.  No device shocks or syncope.  Medication Adjustments/Labs and Tests Ordered: Current medicines are reviewed at length with the patient today.  Concerns regarding medicines are outlined above.   Tests Ordered: No orders of the defined types were placed in this encounter.   Medication Changes: No  orders of the defined types were placed in this encounter.   Disposition:  Follow up 6 weeks in the [place] office.  Signed, Satira Sark, MD, [facility] 12/04/2018 1:16 PM    [facility] at [facility] [address], [place], [place] [address] Phone: [phone]; Fax: [phone]

## 2018-12-04 NOTE — Patient Instructions (Addendum)
Medication Instructions:   INCREASE Lasix to 80 mg twice a day  INCREASE Metolazone to three times a week  Lab Work: BMET in 10 days   ( 11/23 ) If you have labs (blood work) drawn today and your tests are completely normal, you will receive your results only by: Marland Kitchen MyChart Message (if you have MyChart) OR . A paper copy in the mail If you have any lab test that is abnormal or we need to change your treatment, we will call you to review the results.  Testing/Procedures: None today  Follow-Up: At [facility], you and your health needs are our priority.  As part of our continuing mission to provide you with exceptional heart care, we have created designated Provider Care Teams.  These Care Teams include your primary Cardiologist (physician) and Advanced Practice Providers (APPs -  Physician Assistants and Nurse Practitioners) who all work together to provide you with the care you need, when you need it.  Your next appointment:   6 weeks  The format for your next appointment:   In Person  Provider:   Rozann Lesches, MD  Other Instructions None      Thank you for choosing [facility] !

## 2018-12-07 ENCOUNTER — Telehealth (INDEPENDENT_AMBULATORY_CARE_PROVIDER_SITE_OTHER): Payer: Medicare Other | Admitting: Internal Medicine

## 2018-12-07 ENCOUNTER — Other Ambulatory Visit: Payer: Self-pay

## 2018-12-07 DIAGNOSIS — I493 Ventricular premature depolarization: Secondary | ICD-10-CM | POA: Diagnosis not present

## 2018-12-07 DIAGNOSIS — I428 Other cardiomyopathies: Secondary | ICD-10-CM | POA: Diagnosis not present

## 2018-12-07 NOTE — Progress Notes (Signed)
Electrophysiology TeleHealth Note  Due to national recommendations of social distancing due to [place] 19, an audio telehealth visit is felt to be most appropriate for this patient at this time.  Verbal consent was obtained by me for the telehealth visit today.  The patient does not have capability for a virtual visit.  A phone visit is therefore required today.   Date:  12/07/2018   ID:  Melissa Paul, DOB [birth year], MRN [id number]  Location: patient's home  Provider location:  [place]  Evaluation Performed: Follow-up visit  PCP:  Sharion Balloon, FNP   Electrophysiologist:  Dr [name]  Chief Complaint:  palpitations  History of Present Illness:    Melissa Paul is a 73 y.o. female who presents via telehealth conferencing today.  Since last being seen in our clinic, the patient reports doing reasonably well.  She feels frequently tired and weak.   Today, she denies symptoms of palpitations, chest pain, shortness of breath,  lower extremity edema, dizziness, presyncope, or syncope.  The patient is otherwise without complaint today.   Past Medical History:  Diagnosis Date  . Chronic systolic heart failure ([place])   . Glucose intolerance (pre-diabetes)   . Hyperlipemia   . Hypothyroidism   . LBBB (left bundle branch block)   . Mitral regurgitation    Mild to Moderate  . Nonischemic cardiomyopathy ([place])    LVEF 35-40% by echo 8/13  . Paroxysmal atrial fibrillation ([facility])    Brief episodes by device interrogation  . UTI (urinary tract infection)    Recurrent  . Ventricular tachycardia ([place])    ICD therapy for VT    Past Surgical History:  Procedure Laterality Date  . APPENDECTOMY    . BIV ICD GENERATOR CHANGEOUT N/A 07/11/2016   Procedure: BiV ICD Generator Changeout;  Surgeon: Thompson Grayer, MD;  Location: [facility];  Service: Cardiovascular;  Laterality: N/A;  . CARDIAC DEFIBRILLATOR PLACEMENT  09/2009   St. Jude BiV ICD by Dr. Sandria Manly class III   . CHOLECYSTECTOMY    . has one ovary left    . KNEE ARTHROSCOPY     Left  . LEFT AND RIGHT HEART CATHETERIZATION WITH CORONARY ANGIOGRAM N/A 04/12/2014   Procedure: LEFT AND RIGHT HEART CATHETERIZATION WITH CORONARY ANGIOGRAM;  Surgeon: Leonie Man, MD;  Location: [facility] CATH LAB;  Service: Cardiovascular;  Laterality: N/A;  . Left breast biopsy x 2     no maliganancy  . TOTAL ABDOMINAL HYSTERECTOMY     (R) ovary removed    Current Outpatient Medications  Medication Sig Dispense Refill  . acetaminophen (TYLENOL) 500 MG tablet Take 500 mg by mouth every 6 (six) hours as needed for headache.    . albuterol (PROVENTIL) (2.5 MG/3ML) 0.083% nebulizer solution Take 3 mLs (2.5 mg total) by nebulization every 6 (six) hours as needed for wheezing or shortness of breath. 150 mL 1  . aspirin EC 81 MG tablet Take 81 mg by mouth daily.    . carvedilol (COREG) 6.25 MG tablet Take 3 tablets (18.75 mg total) by mouth 2 (two) times daily. 180 tablet 3  . diazepam (VALIUM) 5 MG tablet Take 30 mins prior to CT scan. 1 tablet 0  . digoxin (LANOXIN) 0.125 MG tablet Take 1 tablet (0.125 mg total) by mouth daily. 90 tablet 1  . estradiol (ESTRACE) 0.1 MG/GM vaginal cream Place 1 Applicatorful vaginally at bedtime.    . furosemide (LASIX) 80 MG tablet Take 1 tablet (  80 mg total) by mouth 2 (two) times daily. 180 tablet 3  . levothyroxine (SYNTHROID) 88 MCG tablet Take 1 tablet (88 mcg total) by mouth daily before breakfast. 90 tablet 1  . metFORMIN (GLUCOPHAGE) 500 MG tablet Take 1 tablet by mouth twice daily 180 tablet 0  . metolazone (ZAROXOLYN) 2.5 MG tablet Take 2.5 mg three times a week 12 tablet 3  . pantoprazole (PROTONIX) 40 MG tablet TAKE 1 TABLET BY MOUTH ONCE DAILY BEFORE BREAKFAST 30 tablet 5  . spironolactone (ALDACTONE) 25 MG tablet Take 1 tablet (25 mg total) by mouth daily. 90 tablet 1   No current facility-administered medications for this visit.     Allergies:   Buspar [buspirone], Lexapro  [escitalopram oxalate], Bactrim, Cephalexin, Chlorhexidine gluconate, Clindamycin, Contrast media [iodinated diagnostic agents], Doxycycline, Latex, Levofloxacin, Nitrofurantoin monohyd macro, and Penicillins   Social History:  The patient  reports that she quit smoking about 22 years ago. Her smoking use included cigarettes. She started smoking about 69 years ago. She has a 6.00 pack-year smoking history. She has never used smokeless tobacco. She reports that she does not drink alcohol or use drugs.   Family History:  The patient's family history includes CVA in her mother; Cancer in her father; Heart Problems in her mother; Heart disease in her maternal grandfather and maternal grandmother.   ROS:  Please see the history of present illness.   All other systems are personally reviewed and negative.    Exam:    Vital Signs:  There were no vitals taken for this visit.  Well sounding, alert and conversant   Labs/Other Tests and Data Reviewed:    Recent Labs: 10/15/2018: Hemoglobin 13.1; Platelets 248 10/19/2018: ALT 8; BUN 13; Creatinine, Ser 1.11; Potassium 3.9; Sodium 140; TSH 8.680   Wt Readings from Last 3 Encounters:  12/04/18 201 lb (91.2 kg)  11/19/18 203 lb (92.1 kg)  11/17/18 198 lb 8 oz (90 kg)     Last device remote is reviewed from [facility] PDF which reveals normal device function    ASSESSMENT & PLAN:    1.  PVCs Referred back by Dr Domenic Polite to discuss PVC ablation. She has RBB, LAHB Therapeutic strategies for PVCs including medicine and ablation were discussed in detail with the patient today. Risk, benefits, and alternatives to EP study and radiofrequency ablation were also discussed in detail today. These risks include but are not limited to stroke, bleeding, vascular damage, tamponade, perforation, damage to the heart and other structures, device lead dislodgement worsening renal function, and death. The patient understands these risk and wishes to proceed.  Her  daughter recently had surgery and she is her care giver.  She will contact my office when she wishes to proceed.  Carto/ICE/anesthesia would be required for the procedure  2. Chronic systolic heart failure May improve with better CRT optimization with PVC ablation  3. VF No recurrence  4. Paroxysmal atrial fibrillation Declines anticoagulation  Follow-up:  She will contact my office if she decides to proceed with ablation.   Patient Risk:  after full review of this patients clinical status, I feel that they are at moderate risk at this time.  Today, I have spent 15 minutes with the patient with telehealth technology discussing arrhythmia management .    Army Fossa, MD  12/07/2018 11:28 AM     [place] [address] [phone] (office) [phone] (fax)

## 2018-12-14 ENCOUNTER — Encounter: Payer: Self-pay | Admitting: Family

## 2018-12-14 ENCOUNTER — Ambulatory Visit (INDEPENDENT_AMBULATORY_CARE_PROVIDER_SITE_OTHER): Payer: Medicare Other | Admitting: Family

## 2018-12-14 DIAGNOSIS — R399 Unspecified symptoms and signs involving the genitourinary system: Secondary | ICD-10-CM | POA: Diagnosis not present

## 2018-12-14 DIAGNOSIS — R109 Unspecified abdominal pain: Secondary | ICD-10-CM

## 2018-12-14 MED ORDER — CEFUROXIME AXETIL 500 MG PO TABS: 500.0000 mg | ORAL_TABLET | Freq: Two times a day (BID) | ORAL | 0 refills | Status: DC

## 2018-12-14 NOTE — Progress Notes (Signed)
   Virtual Visit via telephone Note Due to COVID-19 pandemic this visit was conducted virtually. This visit type was conducted due to national recommendations for restrictions regarding the COVID-19 Pandemic (e.g. social distancing, sheltering in place) in an effort to limit this patient's exposure and mitigate transmission in our community. All issues noted in this document were discussed and addressed.  A physical exam was not performed with this format.  I connected with Melissa Paul on 12/14/18 at 12:28 pm by telephone and verified that I am speaking with the correct person using two identifiers. Melissa Paul is currently located at home and granddaughter is currently with her during visit. The provider, Evelina Dun, FNP is located in their office at time of visit.  I discussed the limitations, risks, security and privacy concerns of performing an evaluation and management service by telephone and the availability of in person appointments. I also discussed with the patient that there may be a patient responsible charge related to this service. The patient expressed understanding and agreed to proceed.   History and Present Illness:  Dysuria  This is a recurrent problem. The current episode started in the past 7 days (three days ago). The problem occurs every urination. The problem has been gradually worsening. The quality of the pain is described as burning. The pain is at a severity of 6/10. The pain is mild. There has been no fever. Associated symptoms include flank pain (right), frequency, hematuria, hesitancy and urgency. Pertinent negatives include no discharge or vomiting. She has tried increased fluids for the symptoms. The treatment provided mild relief. Her past medical history is significant for recurrent UTIs.      Review of Systems  Gastrointestinal: Negative for vomiting.  Genitourinary: Positive for dysuria, flank pain (right), frequency, hematuria, hesitancy and  urgency.  All other systems reviewed and are negative.    Observations/Objective: No SOB or distress noted  Assessment and Plan: 1. UTI symptoms Force fluids RTO if symptoms worsen or do not improve  Culture pending - urinalysis- dip and micro; Future - Urine culture; Future - cefUROXime (CEFTIN) 500 MG tablet; Take 1 tablet (500 mg total) by mouth 2 (two) times daily with a meal.  Dispense: 14 tablet; Refill: 0  2. Flank pain - urinalysis- dip and micro; Future - Urine culture; Future - cefUROXime (CEFTIN) 500 MG tablet; Take 1 tablet (500 mg total) by mouth 2 (two) times daily with a meal.  Dispense: 14 tablet; Refill: 0       I discussed the assessment and treatment plan with the patient. The patient was provided an opportunity to ask questions and all were answered. The patient agreed with the plan and demonstrated an understanding of the instructions.   The patient was advised to call back or seek an in-person evaluation if the symptoms worsen or if the condition fails to improve as anticipated.  The above assessment and management plan was discussed with the patient. The patient verbalized understanding of and has agreed to the management plan. Patient is aware to call the clinic if symptoms persist or worsen. Patient is aware when to return to the clinic for a follow-up visit. Patient educated on when it is appropriate to go to the emergency department.   Time call ended:  12:39 pm  I provided 11 minutes of non-face-to-face time during this encounter.    Evelina Dun, FNP

## 2018-12-15 ENCOUNTER — Other Ambulatory Visit: Payer: Self-pay

## 2018-12-15 ENCOUNTER — Other Ambulatory Visit: Payer: Medicare Other

## 2018-12-15 DIAGNOSIS — R399 Unspecified symptoms and signs involving the genitourinary system: Secondary | ICD-10-CM

## 2018-12-15 DIAGNOSIS — R109 Unspecified abdominal pain: Secondary | ICD-10-CM

## 2018-12-15 LAB — URINALYSIS, COMPLETE
Bilirubin, UA: NEGATIVE
Glucose, UA: NEGATIVE
Ketones, UA: NEGATIVE
Nitrite, UA: NEGATIVE
Specific Gravity, UA: 1.015 (ref 1.005–1.030)
Urobilinogen, Ur: 0.2 mg/dL (ref 0.2–1.0)
pH, UA: 6 (ref 5.0–7.5)

## 2018-12-15 LAB — MICROSCOPIC EXAMINATION: WBC, UA: >30 /hpf — AB (ref 0–5)

## 2018-12-17 LAB — URINE CULTURE

## 2018-12-21 ENCOUNTER — Ambulatory Visit (INDEPENDENT_AMBULATORY_CARE_PROVIDER_SITE_OTHER): Payer: Medicare Other

## 2018-12-21 DIAGNOSIS — Z95810 Presence of automatic (implantable) cardiac defibrillator: Secondary | ICD-10-CM

## 2018-12-21 DIAGNOSIS — I5022 Chronic systolic (congestive) heart failure: Secondary | ICD-10-CM

## 2018-12-22 ENCOUNTER — Telehealth: Payer: Self-pay | Admitting: *Deleted

## 2018-12-22 ENCOUNTER — Telehealth: Payer: Self-pay

## 2018-12-22 LAB — BASIC METABOLIC PANEL
BUN/Creatinine Ratio: 14 (calc) (ref 6–22)
BUN: 13 mg/dL (ref 7–25)
CO2: 26 mmol/L (ref 20–32)
Calcium: 8.8 mg/dL (ref 8.6–10.4)
Chloride: 96 mmol/L — ABNORMAL LOW (ref 98–110)
Creat: 0.94 mg/dL — ABNORMAL HIGH (ref 0.60–0.93)
Glucose, Bld: 164 mg/dL — ABNORMAL HIGH (ref 65–139)
Potassium: 3.5 mmol/L (ref 3.5–5.3)
Sodium: 136 mmol/L (ref 135–146)

## 2018-12-22 NOTE — Telephone Encounter (Signed)
Patient informed. Copy sent to PCP °

## 2018-12-22 NOTE — Telephone Encounter (Signed)
Remote ICM transmission received.  Attempted call to patient regarding ICM remote transmission and left detailed message per DPR.  Advised to return call for any fluid symptoms or questions. Next ICM remote transmission scheduled 01/25/2019.     

## 2018-12-22 NOTE — Telephone Encounter (Signed)
-----   Message from Satira Sark, MD sent at 12/22/2018  8:04 AM EST ----- Results reviewed.  Renal function and potassium are stable.  Continue with same plan.

## 2018-12-22 NOTE — Telephone Encounter (Signed)
-----   Message from Satira Sark, MD sent at 12/21/2018  8:09 AM EST ----- Results reviewed.  Renal function stable and potassium normal range.  Continue with current medications.

## 2018-12-22 NOTE — Progress Notes (Signed)
EPIC Encounter for ICM Monitoring  Patient Name: Melissa Paul is a 73 y.o. female Date: 12/22/2018 Primary Care Physican: Sharion Balloon, FNP Primary Cardiologist:[place] Electrophysiologist: [facility] Bi-V Pacing:  86% 10/2/2020Weight: 183 lbs            Attempted call to patient and unable to reach.  Left detailed message per DPR regarding transmission. Transmission reviewed.   CorVueThoracic impedancenormal.  Prescribed:Furosemide80 mgTake 1 tablet (80 mg total) twice a day.   Metolazone 2.5 mg take 1 tablet 3 times a week.  Labs: 12/21/2018 Creatinine 0.94, BUN 13, Potassium 3.5, Sodium 136 12/03/2018 Creatinine 1.04, BUN 13, Potassium 3.8, Sodium 138, GFR 53-61 10/19/2018 Creatinine 1.11, BUN 13, Potassium 3.9, Sodium 140, GFR 49-56 10/15/2018 Creatinine 1.10, BUN 17, Potassium 4.2, Sodium 139, GFR 49-57 08/19/2018 Creatinine 1.09, BUN 15, Potassium 4.4, Sodium 137 06/03/2018 Creatinine 0.92, BUN 18, Potassium 3.4, Sodium 133, GFR >60 05/07/2018 Creatinine 0.83, BUN 13, Potassium 4.1, Sodium 141, GFR 70-80  Recommendations: Left voice mail with ICM number and encouraged to call if experiencing any fluid symptoms.  Follow-up plan: ICM clinic phone appointment on 01/25/2019.   91 day device clinic remote transmission 01/04/2019.  Office appt 02/12/2019 with Dr. Domenic Polite.    Copy of ICM check sent to Dr. [name].   3 month ICM trend: 12/21/2018    1 Year ICM trend:       [name], RN 12/22/2018 3:37 PM

## 2018-12-23 ENCOUNTER — Ambulatory Visit: Payer: Medicare Other | Admitting: Cardiology

## 2018-12-23 ENCOUNTER — Ambulatory Visit (HOSPITAL_COMMUNITY): Payer: Medicare Other

## 2018-12-25 ENCOUNTER — Ambulatory Visit (HOSPITAL_COMMUNITY)
Admission: RE | Admit: 2018-12-25 | Discharge: 2018-12-25 | Disposition: A | Discharge status: Home / Self Care | Payer: Medicare Other | Source: Ambulatory Visit | Attending: Nurse Practitioner | Admitting: Nurse Practitioner

## 2018-12-25 ENCOUNTER — Other Ambulatory Visit: Payer: Self-pay

## 2018-12-25 DIAGNOSIS — K458 Other specified abdominal hernia without obstruction or gangrene: Secondary | ICD-10-CM | POA: Diagnosis present

## 2018-12-25 DIAGNOSIS — R1010 Upper abdominal pain, unspecified: Secondary | ICD-10-CM | POA: Diagnosis not present

## 2018-12-26 ENCOUNTER — Other Ambulatory Visit: Payer: Self-pay | Admitting: Family

## 2018-12-28 ENCOUNTER — Other Ambulatory Visit: Payer: Self-pay | Admitting: Nurse Practitioner

## 2018-12-28 ENCOUNTER — Other Ambulatory Visit: Payer: Self-pay | Admitting: Internal Medicine

## 2018-12-28 ENCOUNTER — Other Ambulatory Visit (INDEPENDENT_AMBULATORY_CARE_PROVIDER_SITE_OTHER): Payer: Self-pay | Admitting: Nurse Practitioner

## 2018-12-28 DIAGNOSIS — K469 Unspecified abdominal hernia without obstruction or gangrene: Secondary | ICD-10-CM

## 2018-12-28 MED ORDER — CARVEDILOL 6.25 MG PO TABS: 18.7500 mg | ORAL_TABLET | Freq: Two times a day (BID) | ORAL | 10 refills | Status: DC

## 2019-01-01 ENCOUNTER — Telehealth: Payer: Self-pay | Admitting: Internal Medicine

## 2019-01-01 NOTE — Telephone Encounter (Signed)
New Message:  Patient wanted to know if her Pacemaker Check could be done as a virtual visit. She is scheduled to come into the office on 01/27/19 but would like to do that appt over the phone if possible. Please advise

## 2019-01-18 ENCOUNTER — Encounter: Payer: Self-pay | Admitting: *Deleted

## 2019-01-18 ENCOUNTER — Ambulatory Visit (INDEPENDENT_AMBULATORY_CARE_PROVIDER_SITE_OTHER): Payer: Medicare Other | Admitting: *Deleted

## 2019-01-18 DIAGNOSIS — I5022 Chronic systolic (congestive) heart failure: Secondary | ICD-10-CM

## 2019-01-18 LAB — CUP PACEART REMOTE DEVICE CHECK
Battery Remaining Longevity: 49 mo
Battery Remaining Percentage: 61 %
Battery Voltage: 2.95 V
Brady Statistic AP VP Percent: 84 %
Brady Statistic AP VS Percent: 3.7 %
Brady Statistic AS VP Percent: 2.9 %
Brady Statistic AS VS Percent: 1 %
Brady Statistic RA Percent Paced: 77 %
Date Time Interrogation Session: 20201227194025
HighPow Impedance: 89 Ohm
HighPow Impedance: 89 Ohm
Implantable Lead Implant Date: 20110912
Implantable Lead Implant Date: 20110912
Implantable Lead Implant Date: 20120710
Implantable Lead Location: 753858
Implantable Lead Location: 753859
Implantable Lead Location: 753860
Implantable Lead Model: 7122
Implantable Pulse Generator Implant Date: 20180621
Lead Channel Impedance Value: 330 Ohm
Lead Channel Impedance Value: 450 Ohm
Lead Channel Impedance Value: 580 Ohm
Lead Channel Pacing Threshold Amplitude: 0.625 V
Lead Channel Pacing Threshold Amplitude: 0.75 V
Lead Channel Pacing Threshold Amplitude: 0.75 V
Lead Channel Pacing Threshold Pulse Width: 0.5 ms
Lead Channel Pacing Threshold Pulse Width: 0.5 ms
Lead Channel Pacing Threshold Pulse Width: 0.5 ms
Lead Channel Sensing Intrinsic Amplitude: 12 mV
Lead Channel Sensing Intrinsic Amplitude: 4.3 mV
Lead Channel Setting Pacing Amplitude: 2 V
Lead Channel Setting Pacing Amplitude: 2 V
Lead Channel Setting Pacing Amplitude: 2 V
Lead Channel Setting Pacing Pulse Width: 0.5 ms
Lead Channel Setting Pacing Pulse Width: 0.5 ms
Lead Channel Setting Sensing Sensitivity: 0.5 mV
Pulse Gen Serial Number: 9761374

## 2019-01-25 ENCOUNTER — Telehealth: Payer: Self-pay | Admitting: Physician Assistant

## 2019-01-25 ENCOUNTER — Other Ambulatory Visit: Payer: Self-pay

## 2019-01-25 NOTE — Telephone Encounter (Signed)
Patient called wanting to switch her in office visit on 01/27/2019 to a virtual visit.

## 2019-01-26 NOTE — Progress Notes (Signed)
Virtual Visit via Telephone Note   This visit type was conducted due to national recommendations for restrictions regarding the COVID-19 Pandemic (e.g. social distancing) in an effort to limit this patient's exposure and mitigate transmission in our community.  Due to her co-morbid illnesses, this patient is at least at moderate risk for complications without adequate follow up.  This format is felt to be most appropriate for this patient at this time.  The patient did not have access to video technology/had technical difficulties with video requiring transitioning to audio format only (telephone).  All issues noted in this document were discussed and addressed.  No physical exam could be performed with this format.  Please refer to the patient's chart for her  consent to telehealth for [facility].   Date:  01/27/2019   ID:  Melissa Paul, DOB 06/29/1945, MRN [id number]  Patient Location: Home Provider Location: Office  PCP:  Sharion Balloon, FNP  Cardiologist:  Rozann Lesches, MD  Electrophysiologist:  Thompson Grayer, MD   Evaluation Performed:  Follow-Up Visit  Chief Complaint:  Previously scheduled/planned visit  History of Present Illness:    Melissa Paul is a 74 y.o. female with PMHx of HTN, HLD, chronic CHF (systolic), NICM, LBBB, VT, PAFib, VT, ICD.  She is seen/visited to day for Dr. [name], seen by him via telehealth visit Oct 2020, was doing fairly well, discussed low BP% at 84%, she had already undergone echo opt and uptitration of her BB.  They discussed PVC ablation as an option, she preferred to hod off (though mentioned if done, given back pain and trouble laying down, using vascular closure devices), planned to come in for an EP APP visit for EKG optimization. Though Nov 2020, she was feeling increasingly fatigued, weak and Dr. Domenic Polite referred her back for revisit of an ablation.  Dr. [name] discussed the procedure with her and she wanted to proceed, though was  caring for her daughter who had recently had surgey and planned to call when she was rady to schedule.  He also ntes that the patient had declined anticoagulation (for her AF)  More recently she saw Dr. Domenic Polite Nov 2020.  He made no changes, mentioned not on an ARB with BP limitations.  Recent device remote noted low BP% and PVCs, mentioned revisiting possible ablation with the patient, she was agreeable to discuss further.        The patient tells me she had intended to come in though yesterday developed GI illness (her daughter had last week and her son a few days ago as well), initially with nausea, stomach upset, then developed diarrhea and could not come in, but wanted to keep the appointment.   She feels "about the same", generally no energy, no dizzy spells, no near syncope or syncope.  She denies CP or palpitations.  She has no rest SOB and denies symptoms or PND, orthopnea, though easily winded with exertion, paces herself.  No better, no worse then what she says it has been "in a long time", "I am used to it"  She reported her weight this AM as 188lbs, she confirms this as accurate, and confirms down from 200lbs  2 months ago.  She reports her legs as swollen, but not as bad as they usual are.  She is taking her diuretics as Dr. Domenic Polite instructed/prescribed Except the furosemide She is not taking 80 mg BID, but takes 80mg  AM and 40 evening (this is noted by him), and about 1  day a week will take an extra 40mg  afternoon.  She had labs done 12/21/2018 (just about 2 weeks after her last Dr. Domenic Polite visit) with stable Creat, K+  She reports abdominal hernias (not new, not worsening) that she is going to need surgery for, she would like her PVC ablation prior to that but is still not ready to move forward with scheduling.  She explains that her daughter is quite ill (ongoing thoracotomy wound, recurrent infection and need for ongoing intermittent debridement surgeries, and she is takes  care of her , takes care of her wound care, dressings, packing, etc... She does not want any kind of procedure until her daughter's care is settled some. That getting an Therapist, sports (or any help) to come in even for a day to help will be very difficult to arrange.  She says she feels no worse then she has for a long time, and just not ready to schedule  She denies ay new/unusual SOB, no cough/cold/virus symptoms, no fever.  No COVID exposures.  Has been minding COVID precautions.  She mentions the GI illness has been passed through the house, seems to be about a 3 day illness base in her daughter/son's experience last week  Device History: St. Jude BiV ICD implanted 09/2009, gen change 06/2016 History of appropriate therapy: yes (with 1st device in 2016, cath done then with no obstructive CAD) History of AAD therapy: No    Past Medical History:  Diagnosis Date  . Abnormal nuclear stress test 04/11/2014  . Atrial fibrillation ([place]) 10/10/2011  . Chronic hypotension 03/14/2011  . Chronic systolic heart failure ([place])   . Constipation 11/12/2017  . Cyst of right kidney 11/12/2017   Needs follow-up imaging 02/2018 from CT scan/ultrasound in 08/2017  . Diabetes mellitus ([place]) 08/11/2017  . GAD (generalized anxiety disorder) 10/19/2018  . Glucose intolerance (pre-diabetes)   . Hyperlipemia   . Hypothyroidism   . LBBB (left bundle branch block)   . Mitral regurgitation    Mild to Moderate  . Nonischemic cardiomyopathy ([place])    LVEF 35-40% by echo 8/13  . Paroxysmal atrial fibrillation ([facility])    Brief episodes by device interrogation  . UTI (urinary tract infection)    Recurrent  . Ventricular tachycardia ([place])    ICD therapy for VT   Past Surgical History:  Procedure Laterality Date  . APPENDECTOMY    . BIV ICD GENERATOR CHANGEOUT N/A 07/11/2016   Procedure: BiV ICD Generator Changeout;  Surgeon: Thompson Grayer, MD;  Location: [place] [facility];  Service: Cardiovascular;  Laterality: N/A;  .  CARDIAC DEFIBRILLATOR PLACEMENT  09/2009   St. Jude BiV ICD by Dr. Sandria Manly class III  . CHOLECYSTECTOMY    . has one ovary left    . KNEE ARTHROSCOPY     Left  . LEFT AND RIGHT HEART CATHETERIZATION WITH CORONARY ANGIOGRAM N/A 04/12/2014   Procedure: LEFT AND RIGHT HEART CATHETERIZATION WITH CORONARY ANGIOGRAM;  Surgeon: Leonie Man, MD;  Location: [facility] CATH LAB;  Service: Cardiovascular;  Laterality: N/A;  . Left breast biopsy x 2     no maliganancy  . TOTAL ABDOMINAL HYSTERECTOMY     (R) ovary removed     Current Meds  Medication Sig  . acetaminophen (TYLENOL) 500 MG tablet Take 500 mg by mouth every 6 (six) hours as needed for headache.  . albuterol (PROVENTIL) (2.5 MG/3ML) 0.083% nebulizer solution Take 3 mLs (2.5 mg total) by nebulization every 6 (six) hours as needed for wheezing  or shortness of breath.  Marland Kitchen aspirin EC 81 MG tablet Take 81 mg by mouth daily.  . carvedilol (COREG) 6.25 MG tablet Take 3 tablets (18.75 mg total) by mouth 2 (two) times daily.  . cefUROXime (CEFTIN) 500 MG tablet Take 1 tablet (500 mg total) by mouth 2 (two) times daily with a meal.  . diazepam (VALIUM) 5 MG tablet Take 30 mins prior to CT scan.  . digoxin (LANOXIN) 0.125 MG tablet Take 1 tablet (0.125 mg total) by mouth daily.  Marland Kitchen estradiol (ESTRACE) 0.1 MG/GM vaginal cream Place 1 Applicatorful vaginally at bedtime.  . furosemide (LASIX) 80 MG tablet Take 1 tablet (80 mg total) by mouth 2 (two) times daily.  Marland Kitchen levothyroxine (SYNTHROID) 88 MCG tablet Take 1 tablet (88 mcg total) by mouth daily before breakfast.  . metFORMIN (GLUCOPHAGE) 500 MG tablet Take 1 tablet (500 mg total) by mouth 2 (two) times daily. Needs to be seen for future refills.  . metolazone (ZAROXOLYN) 2.5 MG tablet Take 2.5 mg three times a week  . pantoprazole (PROTONIX) 40 MG tablet TAKE 1 TABLET BY MOUTH ONCE DAILY BEFORE BREAKFAST  . spironolactone (ALDACTONE) 25 MG tablet Take 1 tablet (25 mg total) by mouth daily.      Allergies:   Buspar [buspirone], Lexapro [escitalopram oxalate], Bactrim, Cephalexin, Chlorhexidine gluconate, Clindamycin, Contrast media [iodinated diagnostic agents], Doxycycline, Latex, Levofloxacin, Nitrofurantoin monohyd macro, and Penicillins   Social History   Tobacco Use  . Smoking status: Former Smoker    Packs/day: 0.30    Years: 20.00    Pack years: 6.00    Types: Cigarettes    Start date: 08/03/1949    Quit date: 01/22/1996    Years since quitting: 23.0  . Smokeless tobacco: Never Used  Substance Use Topics  . Alcohol use: No    Alcohol/week: 0.0 standard drinks  . Drug use: No     Family Hx: The patient's family history includes CVA in her mother; Cancer in her father; Heart Problems in her mother; Heart disease in her maternal grandfather and maternal grandmother. There is no history of Diabetes, Hypertension, or Coronary artery disease.  ROS:   Please see the history of present illness.    All other systems reviewed and are negative.   Prior CV studies:   The following studies were reviewed today:   09/01/2018: TTE IMPRESSIONS 1. The left ventricle has severely reduced systolic function, with an ejection fraction of 25-30%. The cavity size was severely dilated. Left ventricular diastolic Doppler parameters are consistent with pseudonormalization. Elevated left ventricular  end-diastolic pressure Left ventricular diffuse hypokinesis.  2. The right ventricle has normal systolic function. The cavity was normal. There is no increase in right ventricular wall thickness. Right ventricular systolic pressure is normal.  3. Left atrial size was mildly dilated.  4. The aortic valve is tricuspid. Aortic valve regurgitation is trivial by color flow Doppler.  5. The aorta is normal in size and structure.  FINDINGS  Left Ventricle: The left ventricle has severely reduced systolic function, with an ejection fraction of 25-30%. The cavity size was severely dilated. There is  no increase in left ventricular wall thickness. Left ventricular diastolic Doppler parameters  are consistent with pseudonormalization. Elevated left ventricular end-diastolic pressure Left ventricular diffuse hypokinesis.  Right Ventricle: The right ventricle has normal systolic function. The cavity was normal. There is no increase in right ventricular wall thickness. Right ventricular systolic pressure is normal. Pacing wire/catheter visualized in the right ventricle.  Left  Atrium: Left atrial size was mildly dilated.  Right Atrium: Right atrial size was normal in size.  Interatrial Septum: No atrial level shunt detected by color flow Doppler.  Pericardium: There is no evidence of pericardial effusion.  Mitral Valve: The mitral valve is normal in structure. Mitral valve regurgitation is mild by color flow Doppler.  Tricuspid Valve: The tricuspid valve is normal in structure. Tricuspid valve regurgitation is trivial by color flow Doppler.  Aortic Valve: The aortic valve is tricuspid Aortic valve regurgitation is trivial by color flow Doppler.  Pulmonic Valve: The pulmonic valve was normal in structure. Pulmonic valve regurgitation is trivial by color flow Doppler.  Aorta: The aorta is normal in size and structure.  Venous: The inferior vena cava measures 1.52 cm, is normal in size with greater than 50% respiratory variability.     3/22/2016L R/LHC  Angiographically Normal Coronary Arteries with moderate to severely reduced EF of roughly 40%, similar to estimated EF by Myoview.  Likely false positive Myoview for ischemic coronary disease.  Patient did have frequent ectopy during the catheterization.  Relatively normal Right Heart Catheterization pressures with wedge pressure of ~16 & LVEDP of 10. No signs of pulmonary hypertension. There is a large dicrotic notch in the pulmonary arterial pressure waveform that would potentially suggest valvular disease, however on  there was no evidence of MR on LV gram  Labs/Other Tests and Data Reviewed:    EKG:  An ECG dated 10/07/2018 was personally reviewed today and demonstrated:  AV paced, PVCs  Recent Labs: 10/15/2018: Hemoglobin 13.1; Platelets 248 10/19/2018: ALT 8; TSH 8.680 12/21/2018: BUN 13; Creat 0.94; Potassium 3.5; Sodium 136   Recent Lipid Panel Lab Results  Component Value Date/Time   CHOL 156 10/19/2018 11:33 AM   TRIG 89 10/19/2018 11:33 AM   HDL 45 10/19/2018 11:33 AM   CHOLHDL 3.5 10/19/2018 11:33 AM   CHOLHDL 3.3 04/11/2007 06:00 AM   LDLCALC 94 10/19/2018 11:33 AM    Wt Readings from Last 3 Encounters:  01/27/19 188 lb (85.3 kg)  12/04/18 201 lb (91.2 kg)  11/19/18 203 lb (92.1 kg)     Objective:    Vital Signs:  BP (!) 106/58   Pulse 77   Ht 5' 2.5" (1.588 m)   Wt 188 lb (85.3 kg)   BMI 33.84 kg/m    The patient is clear, speaking in full sentences without apparent difficulty.   Speech is clear with a normal pace Does not sound SOB  ASSESSMENT & PLAN:    1. NICM 2. Chronic CHF (combined)     The pt feels like she is at her baseline for the last several months      Her reported weight is down by 12lbs with reported improvement (not resolved) LE edema      Last labs on current regime were stable      She has an in-clinic appointment with Dr. Domenic Polite later this month      She in on a BB, dig, furosemide, metolazone and aldactone, no ARB with BP limitations  CorVue is well below threshold (this seems in conflict with the weight and reports from patient) She is asked to monitor breathing, edema and weight closely, every day, avoid sodium  She seems to have brisk weight loss in the last few weeks, will have her get BMET and a dig level next week  3. CRT-D     Remote reviewed     Battery and lead measurements are stable, there  are no RA/RV thresholds (last in-clinic thresholds 10/02/2018 were good) No VT, no therapies 19146 AMS      A lead noise (known) for her      She had an in-clinic check Sept this year with stable threshold and her impedance is stable      Oct 2019 A lead noise was reproducible, atrial auto sense was turned off and RA sensitivity reduced      I do not appreciate any true AF on EGMs available 87%BP One PVC on presenting rhythm strip   4. PVC's     Resulting in low BV pacing %, suspect to contribute to ongoing HF and fatigue     EPS, ablation procedure discussed at length by Dr. [name] via their last tele-health visit     She is not ready to scheduled 2/2 family needs      She is currently with a GI illness, once over this, she will see about trying to arrange some home help, once she is ready to look into this, instructed to let us know for dates that Dr. [name] has available for her procedure to coordinate    5. Paroxysmal Afib     pt had previously declined a/c     I did not appreciate any true AF on evailabele EGMs  6. VT       No VT, no therpaies  7. GI illness     if not starting to improve today or has ongoing diarrhea, she is instructed to reach out to her PMD             COVID-19 Education: The signs and symptoms of COVID-19 were discussed with the patient and how to seek care for testing (follow up with PCP or arrange E-visit). The importance of social distancing was discussed today.  Time:   Today, I have spent 25 minutes with the patient with telehealth technology discussing the above problems.     Medication Adjustments/Labs and Tests Ordered: Current medicines are reviewed at length with the patient today.  Concerns regarding medicines are outlined above.   Tests Ordered: Orders Placed This Encounter  Procedures  . Digoxin level  . Basic metabolic panel    Medication Changes: No orders of the defined types were placed in this encounter.   Follow Up:  Will tentatively have her scheduled for a virtual visit with Dr. [name] next month, hopefully she will be ready to proceed with ablation procedure,  she will keep her appt with Dr. Domenic Polite.  Signed, Baldwin Jamaica, PA-C  01/27/2019 2:43 PM    [facility]

## 2019-01-27 ENCOUNTER — Telehealth (INDEPENDENT_AMBULATORY_CARE_PROVIDER_SITE_OTHER): Payer: Medicare Other | Admitting: Physician Assistant

## 2019-01-27 ENCOUNTER — Other Ambulatory Visit: Payer: Self-pay

## 2019-01-27 VITALS — BP 106/58 | HR 77 | Ht 62.5 in | Wt 188.0 lb

## 2019-01-27 DIAGNOSIS — I5022 Chronic systolic (congestive) heart failure: Secondary | ICD-10-CM

## 2019-01-27 DIAGNOSIS — Z79899 Other long term (current) drug therapy: Secondary | ICD-10-CM

## 2019-01-27 DIAGNOSIS — I493 Ventricular premature depolarization: Secondary | ICD-10-CM

## 2019-01-27 DIAGNOSIS — I428 Other cardiomyopathies: Secondary | ICD-10-CM | POA: Diagnosis not present

## 2019-01-27 DIAGNOSIS — I480 Paroxysmal atrial fibrillation: Secondary | ICD-10-CM

## 2019-01-27 DIAGNOSIS — I472 Ventricular tachycardia, unspecified: Secondary | ICD-10-CM

## 2019-01-27 DIAGNOSIS — Z95810 Presence of automatic (implantable) cardiac defibrillator: Secondary | ICD-10-CM

## 2019-01-27 NOTE — Patient Instructions (Signed)
Medication Instructions:  Your physician recommends that you continue on your current medications as directed. Please refer to the Current Medication list given to you today.  *If you need a refill on your cardiac medications before your next appointment, please call your pharmacy*  Lab Work:  BMET AND DIG NEXT WEEK Monday ( [place] OFFICE TO DRAW LABS AND NOT HAVE TO TRAVEL TO [place])  If you have labs (blood work) drawn today and your tests are completely normal, you will receive your results only by: Marland Kitchen MyChart Message (if you have MyChart) OR . A paper copy in the mail If you have any lab test that is abnormal or we need to change your treatment, we will call you to review the results.  Testing/Procedures: NONE ORDERED  TODAY   Follow-Up: At [facility], you and your health needs are our priority.  As part of our continuing mission to provide you with exceptional heart care, we have created designated Provider Care Teams.  These Care Teams include your primary Cardiologist (physician) and Advanced Practice Providers (APPs -  Physician Assistants and Nurse Practitioners) who all work together to provide you with the care you need, when you need it.  Your next appointment:   1 month(s)  The format for your next appointment:   In Person  Provider:   You may see Thompson Grayer, MD or one of the following Advanced Practice Providers on your designated Care Team:    Chanetta Marshall, NP  Tommye Standard, PA-C  Legrand Como "[name], [place]   Other Instructions

## 2019-01-28 ENCOUNTER — Ambulatory Visit: Payer: Medicare Other | Admitting: Family

## 2019-01-28 NOTE — Telephone Encounter (Signed)
New message    Pt wants to know if she can have her labs drawn at [facility] in [place]. She said she doesn't see the need in having to drive to [facility] for lab work.

## 2019-02-01 ENCOUNTER — Other Ambulatory Visit: Payer: Medicare Other

## 2019-02-02 ENCOUNTER — Ambulatory Visit: Payer: Medicare Other | Admitting: Cardiology

## 2019-02-02 ENCOUNTER — Telehealth: Payer: Self-pay

## 2019-02-02 NOTE — Telephone Encounter (Signed)
Unable to speak  with patient to remind of missed remote transmission 

## 2019-02-05 ENCOUNTER — Other Ambulatory Visit: Payer: Self-pay

## 2019-02-08 ENCOUNTER — Telehealth: Payer: Self-pay | Admitting: Emergency Medicine

## 2019-02-08 ENCOUNTER — Emergency Department (HOSPITAL_COMMUNITY): Payer: Medicare Other

## 2019-02-08 ENCOUNTER — Encounter: Payer: Self-pay | Admitting: Family

## 2019-02-08 ENCOUNTER — Encounter (HOSPITAL_COMMUNITY): Payer: Self-pay | Admitting: *Deleted

## 2019-02-08 ENCOUNTER — Emergency Department (HOSPITAL_COMMUNITY)
Admission: EM | Admit: 2019-02-08 | Discharge: 2019-02-08 | Disposition: A | Discharge status: Home / Self Care | Payer: Medicare Other | Attending: Emergency Medicine | Admitting: Emergency Medicine

## 2019-02-08 ENCOUNTER — Other Ambulatory Visit: Payer: Self-pay

## 2019-02-08 ENCOUNTER — Ambulatory Visit (INDEPENDENT_AMBULATORY_CARE_PROVIDER_SITE_OTHER): Payer: Medicare Other | Admitting: Family

## 2019-02-08 VITALS — BP 99/55 | HR 68 | Temp 97.5°F | Ht 62.5 in | Wt 198.0 lb

## 2019-02-08 DIAGNOSIS — E039 Hypothyroidism, unspecified: Secondary | ICD-10-CM | POA: Insufficient documentation

## 2019-02-08 DIAGNOSIS — Y999 Unspecified external cause status: Secondary | ICD-10-CM | POA: Diagnosis not present

## 2019-02-08 DIAGNOSIS — E876 Hypokalemia: Secondary | ICD-10-CM | POA: Diagnosis not present

## 2019-02-08 DIAGNOSIS — Z7982 Long term (current) use of aspirin: Secondary | ICD-10-CM | POA: Insufficient documentation

## 2019-02-08 DIAGNOSIS — G44311 Acute post-traumatic headache, intractable: Secondary | ICD-10-CM | POA: Diagnosis not present

## 2019-02-08 DIAGNOSIS — Z7984 Long term (current) use of oral hypoglycemic drugs: Secondary | ICD-10-CM | POA: Insufficient documentation

## 2019-02-08 DIAGNOSIS — I959 Hypotension, unspecified: Secondary | ICD-10-CM | POA: Diagnosis not present

## 2019-02-08 DIAGNOSIS — R55 Syncope and collapse: Secondary | ICD-10-CM | POA: Diagnosis present

## 2019-02-08 DIAGNOSIS — S0083XA Contusion of other part of head, initial encounter: Secondary | ICD-10-CM | POA: Insufficient documentation

## 2019-02-08 DIAGNOSIS — R0789 Other chest pain: Secondary | ICD-10-CM | POA: Insufficient documentation

## 2019-02-08 DIAGNOSIS — Z87891 Personal history of nicotine dependence: Secondary | ICD-10-CM | POA: Diagnosis not present

## 2019-02-08 DIAGNOSIS — E119 Type 2 diabetes mellitus without complications: Secondary | ICD-10-CM | POA: Diagnosis not present

## 2019-02-08 DIAGNOSIS — Y939 Activity, unspecified: Secondary | ICD-10-CM | POA: Insufficient documentation

## 2019-02-08 DIAGNOSIS — R110 Nausea: Secondary | ICD-10-CM

## 2019-02-08 DIAGNOSIS — Z91040 Latex allergy status: Secondary | ICD-10-CM | POA: Insufficient documentation

## 2019-02-08 DIAGNOSIS — I4901 Ventricular fibrillation: Secondary | ICD-10-CM

## 2019-02-08 DIAGNOSIS — R531 Weakness: Secondary | ICD-10-CM | POA: Diagnosis not present

## 2019-02-08 DIAGNOSIS — Y929 Unspecified place or not applicable: Secondary | ICD-10-CM | POA: Insufficient documentation

## 2019-02-08 DIAGNOSIS — I5022 Chronic systolic (congestive) heart failure: Secondary | ICD-10-CM | POA: Insufficient documentation

## 2019-02-08 DIAGNOSIS — S0990XA Unspecified injury of head, initial encounter: Secondary | ICD-10-CM | POA: Diagnosis not present

## 2019-02-08 DIAGNOSIS — Z95810 Presence of automatic (implantable) cardiac defibrillator: Secondary | ICD-10-CM | POA: Insufficient documentation

## 2019-02-08 DIAGNOSIS — Z79899 Other long term (current) drug therapy: Secondary | ICD-10-CM | POA: Diagnosis not present

## 2019-02-08 DIAGNOSIS — W01190A Fall on same level from slipping, tripping and stumbling with subsequent striking against furniture, initial encounter: Secondary | ICD-10-CM | POA: Insufficient documentation

## 2019-02-08 LAB — BASIC METABOLIC PANEL
Anion gap: 11 (ref 5–15)
BUN: 10 mg/dL (ref 8–23)
CO2: 28 mmol/L (ref 22–32)
Calcium: 8.2 mg/dL — ABNORMAL LOW (ref 8.9–10.3)
Chloride: 97 mmol/L — ABNORMAL LOW (ref 98–111)
Creatinine, Ser: 0.94 mg/dL (ref 0.44–1.00)
GFR calc Af Amer: >60 mL/min (ref >60)
GFR calc non Af Amer: 59 mL/min — ABNORMAL LOW (ref >60)
Glucose, Bld: 184 mg/dL — ABNORMAL HIGH (ref 70–99)
Potassium: 3.1 mmol/L — ABNORMAL LOW (ref 3.5–5.1)
Sodium: 136 mmol/L (ref 135–145)

## 2019-02-08 LAB — CBC WITH DIFFERENTIAL/PLATELET
Abs Immature Granulocytes: 0.12 10*3/uL — ABNORMAL HIGH (ref 0.00–0.07)
Basophils Absolute: 0 10*3/uL (ref 0.0–0.1)
Basophils Relative: 1 %
Eosinophils Absolute: 0.1 10*3/uL (ref 0.0–0.5)
Eosinophils Relative: 1 %
HCT: 37.2 % (ref 36.0–46.0)
Hemoglobin: 12.6 g/dL (ref 12.0–15.0)
Immature Granulocytes: 2 %
Lymphocytes Relative: 20 %
Lymphs Abs: 1.6 10*3/uL (ref 0.7–4.0)
MCH: 31.1 pg (ref 26.0–34.0)
MCHC: 33.9 g/dL (ref 30.0–36.0)
MCV: 91.9 fL (ref 80.0–100.0)
Monocytes Absolute: 0.5 10*3/uL (ref 0.1–1.0)
Monocytes Relative: 6 %
Neutro Abs: 5.7 10*3/uL (ref 1.7–7.7)
Neutrophils Relative %: 70 %
Platelets: 252 10*3/uL (ref 150–400)
RBC: 4.05 MIL/uL (ref 3.87–5.11)
RDW: 13.6 % (ref 11.5–15.5)
WBC: 8 10*3/uL (ref 4.0–10.5)
nRBC: 0 % (ref 0.0–0.2)

## 2019-02-08 LAB — MAGNESIUM: Magnesium: 1.7 mg/dL (ref 1.7–2.4)

## 2019-02-08 MED ORDER — SODIUM CHLORIDE 0.9 % IV SOLN: INTRAVENOUS | Status: DC

## 2019-02-08 MED ORDER — MAGNESIUM SULFATE 2 GM/50ML IV SOLN
2.0000 g | Freq: Once | INTRAVENOUS | Status: AC
  Administered 2019-02-08: 2 g via INTRAVENOUS
  Filled 2019-02-08: qty 50

## 2019-02-08 MED ORDER — LORAZEPAM 2 MG/ML IJ SOLN
1.0000 mg | Freq: Once | INTRAMUSCULAR | Status: AC | PRN
  Administered 2019-02-08: 1 mg via INTRAVENOUS
  Filled 2019-02-08: qty 1

## 2019-02-08 MED ORDER — POTASSIUM CHLORIDE 10 MEQ/100ML IV SOLN
10.0000 meq | Freq: Once | INTRAVENOUS | Status: AC
  Administered 2019-02-08: 10 meq via INTRAVENOUS
  Filled 2019-02-08: qty 100

## 2019-02-08 MED ORDER — POTASSIUM CHLORIDE CRYS ER 20 MEQ PO TBCR: 20.0000 meq | EXTENDED_RELEASE_TABLET | Freq: Two times a day (BID) | ORAL | 0 refills | Status: DC

## 2019-02-08 MED ORDER — POTASSIUM CHLORIDE CRYS ER 20 MEQ PO TBCR
40.0000 meq | EXTENDED_RELEASE_TABLET | Freq: Once | ORAL | Status: AC
  Administered 2019-02-08: 40 meq via ORAL
  Filled 2019-02-08: qty 2

## 2019-02-08 MED ORDER — MAG-OXIDE 200 MG PO TABS: 200.0000 mg | ORAL_TABLET | Freq: Two times a day (BID) | ORAL | 0 refills | Status: AC

## 2019-02-08 NOTE — Telephone Encounter (Signed)
Patient being seen by Lenna Gilford NP at [facility]. Spoke with Lenna Gilford and she reported patient was drowsy, nausous, reported a HA after a syncopal episode and fall 02/07/19. Made provider aware that patient received shock from device due to VF on 02/07/19. Patient is being transferred to [facility] ED by EMS and [facility] NP will make EMS aware of VF with shock from ICD.

## 2019-02-08 NOTE — ED Notes (Signed)
Temperature is recorded and documented under vital signs taken in triage.

## 2019-02-08 NOTE — ED Notes (Signed)
Pt contact notified- pt daughter, EMMA- informed that pt will be admitted and that one person during the entire admission can visit, hours are from 10 am-8p

## 2019-02-08 NOTE — ED Notes (Signed)
Patient transported to X-ray Will start IV K+ when pt returns

## 2019-02-08 NOTE — ED Provider Notes (Signed)
[facility] EMERGENCY DEPARTMENT Provider Note   CSN: [id number] Arrival date & time: 02/08/19  1033     History Chief Complaint  Patient presents with  . Fall  . Loss of Consciousness    Melissa Paul is a 74 y.o. female.  HPI 74 year old female presents after a fall.  She was folding laundry and felt lightheaded like she was going to pass out.  Her ICD fired last night.  She was made aware of this via cardiology.  She thinks this might of happened to her 1 month ago as well.  She hit her head on the chair and has bruising to her forehead.  Has some headache and lightheadedness with nausea.  Also injured her back and left ribs.  She states she has chronic chest pain but no new chest pain during this scenario.  Is not on blood thinners.   Past Medical History:  Diagnosis Date  . Abnormal nuclear stress test 04/11/2014  . Atrial fibrillation ([place]) 10/10/2011  . Chronic hypotension 03/14/2011  . Chronic systolic heart failure ([place])   . Constipation 11/12/2017  . Cyst of right kidney 11/12/2017   Needs follow-up imaging 02/2018 from CT scan/ultrasound in 08/2017  . Diabetes mellitus ([place]) 08/11/2017  . GAD (generalized anxiety disorder) 10/19/2018  . Glucose intolerance (pre-diabetes)   . Hyperlipemia   . Hypothyroidism   . LBBB (left bundle branch block)   . Mitral regurgitation    Mild to Moderate  . Nonischemic cardiomyopathy ([place])    LVEF 35-40% by echo 8/13  . Paroxysmal atrial fibrillation (HCC)    Brief episodes by device interrogation  . UTI (urinary tract infection)    Recurrent  . Ventricular tachycardia ([place])    ICD therapy for VT    Patient Active Problem List   Diagnosis Date Noted  . GAD (generalized anxiety disorder) 10/19/2018  . Nausea with vomiting 10/08/2018  . Upper abdominal pain 10/08/2018  . Constipation 11/12/2017  . Cyst of right kidney 11/12/2017  . Diabetes mellitus ([place]) 08/11/2017  . Acute on chronic systolic ACC/AHA stage C congestive  heart failure ([place]) 06/08/2014  . Abnormal myocardial perfusion study   . Exertional dyspnea - NYHA III CHF 04/11/2014  . Abnormal nuclear stress test 04/11/2014  . Ventricular tachyarrhythmia ([place]) 03/17/2014  . Atrial fibrillation ([place]) 10/10/2011  . Chronic hypotension 03/14/2011  . 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 MITRAL REGURGITATION 07/14/2009  . Chronic systolic heart failure ([place]) 07/14/2009  . Hypothyroidism 11/29/2008  . Nonischemic cardiomyopathy ([place]) 11/29/2008  . LBBB 11/29/2008    Past Surgical History:  Procedure Laterality Date  . APPENDECTOMY    . BIV ICD GENERATOR CHANGEOUT N/A 07/11/2016   Procedure: BiV ICD Generator Changeout;  Surgeon: Thompson Grayer, MD;  Location: [place];  Service: Cardiovascular;  Laterality: N/A;  . CARDIAC DEFIBRILLATOR PLACEMENT  09/2009   [employer] BiV ICD by Dr. Sandria Manly class III  . CHOLECYSTECTOMY    . has one ovary left    . KNEE ARTHROSCOPY     Left  . LEFT AND RIGHT HEART CATHETERIZATION WITH CORONARY ANGIOGRAM N/A 04/12/2014   Procedure: LEFT AND RIGHT HEART CATHETERIZATION WITH CORONARY ANGIOGRAM;  Surgeon: Leonie Man, MD;  Location: [facility] CATH LAB;  Service: Cardiovascular;  Laterality: N/A;  . Left breast biopsy x 2     no maliganancy  . TOTAL ABDOMINAL HYSTERECTOMY     (R) ovary removed     OB History   No  obstetric history on file.     Family History  Problem Relation Age of Onset  . CVA Mother        multiple  . Heart Problems Mother        "weak heart"  . Heart disease Maternal Grandmother   . Heart disease Maternal Grandfather   . Cancer Father        Stomach and eshpogus  . Diabetes Neg Hx   . Hypertension Neg Hx   . Coronary artery disease Neg Hx     Social History   Tobacco Use  . Smoking status: Former Smoker    Packs/day: 0.30    Years: 20.00    Pack years: 6.00    Types: Cigarettes    Start date: 08/03/1949    Quit date: 01/22/1996    Years  since quitting: 23.0  . Smokeless tobacco: Never Used  Substance Use Topics  . Alcohol use: No    Alcohol/week: 0.0 standard drinks  . Drug use: No    Home Medications Prior to Admission medications   Medication Sig Start Date End Date Taking? Authorizing Provider  acetaminophen (TYLENOL) 500 MG tablet Take 500 mg by mouth every 6 (six) hours as needed for headache.   Yes [provider]  albuterol (PROVENTIL) (2.5 MG/3ML) 0.083% nebulizer solution Take 3 mLs (2.5 mg total) by nebulization every 6 (six) hours as needed for wheezing or shortness of breath. 11/22/17  Yes Eustaquio Maize, MD  aspirin EC 81 MG tablet Take 81 mg by mouth daily.   Yes [provider]  carvedilol (COREG) 6.25 MG tablet Take 3 tablets (18.75 mg total) by mouth 2 (two) times daily. 12/28/18  Yes Allred, Jeneen Rinks, MD  digoxin (LANOXIN) 0.125 MG tablet Take 1 tablet (0.125 mg total) by mouth daily. 11/19/18  Yes Satira Sark, MD  estradiol (ESTRACE) 0.1 MG/GM vaginal cream Place 1 Applicatorful vaginally at bedtime.   Yes [provider]  furosemide (LASIX) 80 MG tablet Take 1 tablet (80 mg total) by mouth 2 (two) times daily. 12/04/18 03/04/19 Yes Satira Sark, MD  levothyroxine (SYNTHROID) 88 MCG tablet Take 1 tablet (88 mcg total) by mouth daily before breakfast. 10/20/18  Yes Hawks, Christy A, FNP  metFORMIN (GLUCOPHAGE) 500 MG tablet Take 1 tablet (500 mg total) by mouth 2 (two) times daily. Needs to be seen for future refills. 12/28/18  Yes Hawks, Christy A, FNP  pantoprazole (PROTONIX) 40 MG tablet TAKE 1 TABLET BY MOUTH ONCE DAILY BEFORE BREAKFAST 11/23/18  Yes Rehman, Mechele Dawley, MD  spironolactone (ALDACTONE) 25 MG tablet Take 1 tablet (25 mg total) by mouth daily. 10/26/18  Yes Hawks, Christy A, FNP  Magnesium Oxide (MAG-OXIDE) 200 MG TABS Take 1 tablet (200 mg total) by mouth 2 (two) times daily for 3 days. 02/08/19 02/11/19  Sherwood Gambler, MD  potassium chloride SA (KLOR-CON) 20  MEQ tablet Take 1 tablet (20 mEq total) by mouth 2 (two) times daily for 5 days. 02/08/19 02/13/19  Sherwood Gambler, MD    Allergies    Buspar [buspirone], Lexapro [escitalopram oxalate], Bactrim, Cephalexin, Chlorhexidine gluconate, Clindamycin, Contrast media [iodinated diagnostic agents], Doxycycline, Latex, Levofloxacin, Nitrofurantoin monohyd macro, and Penicillins  Review of Systems   Review of Systems  Eyes: Negative for visual disturbance.  Respiratory: Negative for shortness of breath.   Cardiovascular: Positive for chest pain.  Gastrointestinal: Positive for nausea. Negative for vomiting.  Musculoskeletal: Positive for back pain.  Neurological: Positive for syncope and headaches. Negative  for weakness and numbness.  All other systems reviewed and are negative.   Physical Exam Updated Vital Signs BP 107/66   Pulse 70   Resp 18   SpO2 97%   Physical Exam Vitals and nursing note reviewed.  Constitutional:      General: She is not in acute distress.    Appearance: She is well-developed. She is not ill-appearing or diaphoretic.  HENT:     Head: Normocephalic.     Comments: Left forehead and periorbital ecchymosis.    Right Ear: External ear normal.     Left Ear: External ear normal.     Nose: Nose normal.  Eyes:     General:        Right eye: No discharge.        Left eye: No discharge.     Extraocular Movements: Extraocular movements intact.     Pupils: Pupils are equal, round, and reactive to light.  Cardiovascular:     Rate and Rhythm: Normal rate and regular rhythm.     Heart sounds: Normal heart sounds.  Pulmonary:     Effort: Pulmonary effort is normal.     Breath sounds: Normal breath sounds.  Chest:     Chest wall: Tenderness (left mid axillary line) present.  Abdominal:     Palpations: Abdomen is soft.     Tenderness: There is no abdominal tenderness.  Musculoskeletal:     Lumbar back: Tenderness (diffuse) present.  Skin:    General: Skin is warm and  dry.  Neurological:     Mental Status: She is alert.     Comments: CN 3-12 grossly intact. 5/5 strength in all 4 extremities. Grossly normal sensation. Normal finger to nose.   Psychiatric:        Mood and Affect: Mood is not anxious.     ED Results / Procedures / Treatments   Labs (all labs ordered are listed, but only abnormal results are displayed) Labs Reviewed  BASIC METABOLIC PANEL - Abnormal; Notable for the following components:      Result Value   Potassium 3.1 (*)    Chloride 97 (*)    Glucose, Bld 184 (*)    Calcium 8.2 (*)    GFR calc non Af Amer 59 (*)    All other components within normal limits  CBC WITH DIFFERENTIAL/PLATELET - Abnormal; Notable for the following components:   Abs Immature Granulocytes 0.12 (*)    All other components within normal limits  MAGNESIUM    EKG EKG Interpretation  Date/Time:  Monday February 08 2019 10:41:07 EST Ventricular Rate:  69 PR Interval:    QRS Duration: 120 QT Interval:  412 QTC Calculation: 442 R Axis:   -103 Text Interpretation: Sinus rhythm Paired ventricular premature complexes Incomplete RBBB and LAFB Consider anterior infarct Confirmed by Sherwood Gambler [phone]) on 02/08/2019 11:21:55 AM   Radiology DG Chest 2 View  Result Date: 02/08/2019 CLINICAL DATA:  Chest and back pain. ICD discharged. EXAM: CHEST - 2 VIEW COMPARISON:  02/27/2017 FINDINGS: Borderline cardiomegaly. AICD in place. Pulmonary vascularity is normal in the lungs are clear. No bone abnormality. IMPRESSION: No acute disease. Borderline cardiomegaly. Electronically Signed   By: Lorriane Shire M.D.   On: 02/08/2019 13:24   DG Lumbar Spine Complete  Result Date: 02/08/2019 CLINICAL DATA:  Back pain and left-sided chest pain since a fall yesterday due to syncope. EXAM: LUMBAR SPINE - COMPLETE 4+ VIEW COMPARISON:  CT scan of the abdomen and pelvis dated  12/25/2018 FINDINGS: There is no fracture. Alignment is normal. Moderately severe bilateral facet  arthritis at L4-5 and L5-S1. Disc spaces are preserved. IMPRESSION: No acute abnormality of the lumbar spine. Degenerative facet arthritis in the lower lumbar spine. Electronically Signed   By: Lorriane Shire M.D.   On: 02/08/2019 13:26   CT HEAD WO CONTRAST  Result Date: 02/08/2019 CLINICAL DATA:  Recent syncopal episode with head injury, initial encounter EXAM: CT HEAD WITHOUT CONTRAST TECHNIQUE: Contiguous axial images were obtained from the base of the skull through the vertex without intravenous contrast. COMPARISON:  None. FINDINGS: Brain: No evidence of acute infarction, hemorrhage, hydrocephalus, extra-axial collection or mass lesion/mass effect. Vascular: No hyperdense vessel or unexpected calcification. Skull: Normal. Negative for fracture or focal lesion. Sinuses/Orbits: No acute finding. Other: Mild soft tissue swelling is noted in the left forehead consistent with the recent injury. IMPRESSION: No acute intracranial abnormality noted. Left forehead soft tissue swelling consistent with the recent injury. Electronically Signed   By: Inez Catalina M.D.   On: 02/08/2019 14:26    Procedures Procedures (including critical care time)  Medications Ordered in ED Medications  0.9 %  sodium chloride infusion ( Intravenous Stopped 02/08/19 1458)  LORazepam (ATIVAN) injection 1 mg (1 mg Intravenous Given 02/08/19 1400)  potassium chloride SA (KLOR-CON) CR tablet 40 mEq (40 mEq Oral Given 02/08/19 1235)  potassium chloride 10 mEq in 100 mL IVPB (0 mEq Intravenous Stopped 02/08/19 1457)  magnesium sulfate IVPB 2 g 50 mL (0 g Intravenous Stopped 02/08/19 1458)    ED Course  I have reviewed the triage vital signs and the nursing notes.  Pertinent labs & imaging results that were available during my care of the patient were reviewed by me and considered in my medical decision making (see chart for details).    MDM Rules/Calculators/A&P                      Patient's work-up shows mild hypokalemia to  3.1.  Magnesium is normal.  ICD interrogation shows she had ventricular tachycardia that spontaneously converted on 1/7.  Had another episode terminated by cardioversion yesterday.  She has not had anginal symptoms.  Work-up for traumatic injury is negative.  Patient discussed with on-call cardiology, Dr. Bronson Ing.  He advises getting potassium higher, closer to 4 and magnesium closer to 2.  He feels it is okay for outpatient replete meant.  Started with IV and oral treatment here.  Given prescriptions.  She did have diarrhea a week or 2 ago which likely led to this hypokalemia.  Will need to follow-up closely with cardiology. Final Clinical Impression(s) / ED Diagnoses Final diagnoses:  Hypokalemia  Forehead contusion, initial encounter    Rx / DC Orders ED Discharge Orders         Ordered    potassium chloride SA (KLOR-CON) 20 MEQ tablet  2 times daily     02/08/19 1516    Magnesium Oxide (MAG-OXIDE) 200 MG TABS  2 times daily     02/08/19 1516           Sherwood Gambler, MD 02/08/19 1757

## 2019-02-08 NOTE — ED Notes (Signed)
Melissa Paul with [employer] called with information from pacemaker interrogation - phone given to Dr Regenia Skeeter

## 2019-02-08 NOTE — ED Notes (Signed)
Spoke with pt daughter, EMMA, added her cell phone number to demographic info. Informed daughter will call back when all tests have resulted.

## 2019-02-08 NOTE — ED Notes (Signed)
Spoke with pt daughter, Terrence Dupont and informed would call back with pt dispo

## 2019-02-08 NOTE — Progress Notes (Signed)
Subjective:    Patient ID: Melissa Paul, female    DOB: April 02, 1945, 75 y.o.   MRN: [id number]  Chief Complaint  Patient presents with  . swollen tongue, excessive saliva  . low blood pressue    HPI  Pt presents to the office today with weakness. She states she fell last night and hit her head on the dryer. She reports she was sitting in a chair and passed out.  Her family called EMS and her VS were stable at that time. Denies any new vision, speech, or gait.   She states she is feeling nauseate, fatigue, and has a headache. She states her headache is a 6 out of 10.   Her BP is very low today. Her last BP in the office was 121/77. She has not taken her lasix today.   Review of Systems  Constitutional: Positive for fatigue.  Neurological: Positive for weakness.  All other systems reviewed and are negative.      Objective:   Physical Exam Vitals reviewed.  Constitutional:      General: She is not in acute distress.    Appearance: She is well-developed.     Comments: Drowsy, weakness  HENT:     Head: Normocephalic and atraumatic.     Right Ear: Tympanic membrane normal.     Left Ear: Tympanic membrane normal.  Eyes:     Pupils: Pupils are equal, round, and reactive to light.  Neck:     Thyroid: No thyromegaly.  Cardiovascular:     Rate and Rhythm: Normal rate and regular rhythm.     Heart sounds: Normal heart sounds. No murmur.  Pulmonary:     Effort: Pulmonary effort is normal. No respiratory distress.     Breath sounds: Normal breath sounds. No wheezing.  Abdominal:     General: Bowel sounds are normal. There is no distension.     Palpations: Abdomen is soft.     Tenderness: There is no abdominal tenderness.  Musculoskeletal:        General: No tenderness.     Cervical back: Normal range of motion and neck supple.     Comments: Weakness  Skin:    General: Skin is warm and dry.  Neurological:     Mental Status: She is alert and oriented to person, place,  and time.     Cranial Nerves: No cranial nerve deficit.     Deep Tendon Reflexes: Reflexes are normal and symmetric.  Psychiatric:        Behavior: Behavior normal.        Thought Content: Thought content normal.        Judgment: Judgment normal.        BP (!) 86/58 (BP Location: Right Arm, Patient Position: Sitting, Cuff Size: Normal)   Pulse 65   Temp (!) 97.5 F (36.4 C) (Temporal)   Ht 5' 2.5" (1.588 m)   Wt 198 lb (89.8 kg)   SpO2 98%   BMI 35.64 kg/m    Assessment & Plan:  Melissa Paul comes in today with chief complaint of swollen tongue, excessive saliva; low blood pressue; and passed out last night ,hit her head   Diagnosis and orders addressed:  1. Injury of head, initial encounter  2. Weakness  3. Intractable acute post-traumatic headache  4. Hypotension, unspecified hypotension type  5. Nausea  6. VF (ventricular fibrillation) (HCC)    Given hypotension, nausea, headache, drowsiness, and weakness will send to ED to rule out  bleed. Cardiologists nurse called and  Let us know that she went into VF last night and was given shock form ICD.    Evelina Dun, FNP

## 2019-02-08 NOTE — Discharge Instructions (Signed)
Call your cardiologist tomorrow.  It is very important to make sure you take the potassium and magnesium as prescribed.  If you develop any new or recurrent symptoms then return to the ER or call 911.

## 2019-02-08 NOTE — Telephone Encounter (Signed)
LMOM for patient and Melissa Paul per DPR Patient had episode of VF on 02/07/19  @ 1814 . Patient received ATP x1 then she received successful 25J shock that terminated the episode.

## 2019-02-08 NOTE — ED Triage Notes (Signed)
Pt in from [place] via [facility] for eval for syncopal episode last night where pt hit head on a dryer after her pacemaker fired causing a syncopal episode, pt reports loc unknown length of time witnessed by her daughter, EMS checked her out last night and she declined transport, today the pt went to her PCP and was sent her d/t pt having headache and nausea, A&O x4, pt takes 81 mg ASA daily

## 2019-02-08 NOTE — ED Notes (Signed)
Pt on phone with pharmacy tech at this time.

## 2019-02-08 NOTE — ED Notes (Signed)
Pt back from CT

## 2019-02-08 NOTE — ED Notes (Signed)
Patient transported to CT- pt premedicated due to anxiety issues with having CT scan

## 2019-02-09 ENCOUNTER — Telehealth: Payer: Self-pay | Admitting: Internal Medicine

## 2019-02-09 NOTE — Telephone Encounter (Signed)
Pt was seen in ED and told to make f/u apt w/ Renee-- pt stated her device stopped working and she passed out and hit her head pretty hard.   Please give pt a call

## 2019-02-09 NOTE — Telephone Encounter (Signed)
Spoke with patient. She reports she was started on potassium and magnesium supplementation at hospital discharge yesterday. Needs to schedule f/u per discharge instructions. Pt agreeable to virtual visit (telephone) with Dr. [name] on 02/15/19 at 10:45am. Also has f/u with Dr. Domenic Polite on 02/12/19. MOD report from [facility] ED is available for review under media tab. Pt aware of [place] DMV driving restrictions x6 months. Pt was recently seen via virtual visit on 01/27/19 by Tommye Standard, PA-C. Pt reports ongoing weakness and fatigue for the past few months. Advised to call in the interim if any worsening symptoms, also advised to seek emergency medical attention if any additional syncope or HV therapies. Pt verbalizes understanding of all recommendations. Denies any further questions at this time.

## 2019-02-11 ENCOUNTER — Encounter: Payer: Self-pay | Admitting: Cardiology

## 2019-02-11 NOTE — Progress Notes (Signed)
Cardiology Office Note  Date: 02/12/2019   ID: Melissa Paul, DOB 06-25-1945, MRN [id number]  PCP:  Melissa Balloon, FNP  Cardiologist:  Melissa Lesches, MD Electrophysiologist:  Melissa Grayer, MD   Chief Complaint  Patient presents with  . Cardiac follow-up    History of Present Illness: Melissa Paul is a 74 y.o. female last seen in November 2020.  She presents today for a follow-up visit.  She sees Dr. Rayann Paul in the device clinic, [place] biventricular ICD in place.  I reviewed the chart.  There has been discussion about pursuing a PVC ablation (in an effort to help optimize biventricular pacing), however this has been deferred for variety of reasons including illness and also since [name] has been providing care for her daughter at home.  Records also show recent ER encounter following episode of syncope.  Device interrogation listed in the chart indicates that the patient received a shock due to VF on January 17.  She was started on magnesium and potassium supplements at ER discharge.  Recent lab work is outlined below.  Her weight has been up compared to early in January.  She denies any dietary or fluid indiscretions.  She has been taking Lasix 80 mg in the morning and 40 mg in the afternoon, no longer on metolazone.  I reviewed her medications which are otherwise stable and outlined below.  She has a virtual encounter scheduled with Dr. Rayann Paul on Monday.  Past Medical History:  Diagnosis Date  . Anxiety   . Chronic systolic heart failure (Melissa Paul)   . Constipation   . Cyst of right kidney 11/12/2017  . Glucose intolerance (pre-diabetes)   . Hyperlipemia   . Hypothyroidism   . LBBB (left bundle branch block)   . Mitral regurgitation    Mild to Moderate  . Nonischemic cardiomyopathy ([place])    LVEF 35-40% by echo 8/13  . Paroxysmal atrial fibrillation ([facility])    Brief episodes by device interrogation  . UTI (urinary tract infection)    Recurrent  . Ventricular  tachycardia ([place])    ICD therapy for VT    Past Surgical History:  Procedure Laterality Date  . APPENDECTOMY    . BIV ICD GENERATOR CHANGEOUT N/A 07/11/2016   Procedure: BiV ICD Generator Changeout;  Surgeon: Melissa Grayer, MD;  Location: [facility];  Service: Cardiovascular;  Laterality: N/A;  . CARDIAC DEFIBRILLATOR PLACEMENT  09/2009   St. Jude BiV ICD by Dr. Sandria Manly class III  . CHOLECYSTECTOMY    . has one ovary left    . KNEE ARTHROSCOPY     Left  . LEFT AND RIGHT HEART CATHETERIZATION WITH CORONARY ANGIOGRAM N/A 04/12/2014   Procedure: LEFT AND RIGHT HEART CATHETERIZATION WITH CORONARY ANGIOGRAM;  Surgeon: Leonie Man, MD;  Location: [facility] C[facility] LAB;  Service: Cardiovascular;  Laterality: N/A;  . Left breast biopsy x 2     no maliganancy  . TOTAL ABDOMINAL HYSTERECTOMY     (R) ovary removed    Current Outpatient Medications  Medication Sig Dispense Refill  . acetaminophen (TYLENOL) 500 MG tablet Take 500 mg by mouth every 6 (six) hours as needed for headache.    . albuterol (PROVENTIL) (2.5 MG/3ML) 0.083% nebulizer solution Take 3 mLs (2.5 mg total) by nebulization every 6 (six) hours as needed for wheezing or shortness of breath. 150 mL 1  . aspirin EC 81 MG tablet Take 81 mg by mouth daily.    . carvedilol (  COREG) 6.25 MG tablet Take 3 tablets (18.75 mg total) by mouth 2 (two) times daily. 180 tablet 10  . digoxin (LANOXIN) 0.125 MG tablet Take 1 tablet (0.125 mg total) by mouth daily. 90 tablet 1  . estradiol (ESTRACE) 0.1 MG/GM vaginal cream Place 1 Applicatorful vaginally at bedtime.    . furosemide (LASIX) 80 MG tablet Take 1 tablet (80 mg total) by mouth every morning. & 40 mg in the pm 135 tablet 3  . levothyroxine (SYNTHROID) 88 MCG tablet Take 1 tablet (88 mcg total) by mouth daily before breakfast. 90 tablet 1  . Magnesium 400 MG TABS Take 200 mg by mouth 2 (two) times daily.    . metFORMIN (GLUCOPHAGE) 500 MG tablet Take 1 tablet (500 mg total) by mouth 2  (two) times daily. Needs to be seen for future refills. 60 tablet 0  . pantoprazole (PROTONIX) 40 MG tablet TAKE 1 TABLET BY MOUTH ONCE DAILY BEFORE BREAKFAST 30 tablet 5  . potassium chloride SA (KLOR-CON) 20 MEQ tablet Take 1 tablet (20 mEq total) by mouth 2 (two) times daily for 5 days. 10 tablet 0  . spironolactone (ALDACTONE) 25 MG tablet Take 1 tablet (25 mg total) by mouth daily. 90 tablet 1   No current facility-administered medications for this visit.   Allergies:  Buspar [buspirone], Lexapro [escitalopram oxalate], Bactrim, Cephalexin, Chlorhexidine gluconate, Clindamycin, Contrast media [iodinated diagnostic agents], Doxycycline, Latex, Levofloxacin, Nitrofurantoin monohyd macro, and Penicillins   Social History: The patient  reports that she quit smoking about 23 years ago. Her smoking use included cigarettes. She started smoking about 69 years ago. She has a 6.00 pack-year smoking history. She has never used smokeless tobacco. She reports that she does not drink alcohol or use drugs.  ROS:  Please see the history of present illness. Otherwise, complete review of systems is positive for fatigue.  All other systems are reviewed and negative.   Physical Exam: VS:  BP 109/68   Pulse 70   Ht 5' 2.5" (1.588 m)   Wt 195 lb 6.4 oz (88.6 kg)   SpO2 97%   BMI 35.17 kg/m , BMI Body mass index is 35.17 kg/m.  Wt Readings from Last 3 Encounters:  02/12/19 195 lb 6.4 oz (88.6 kg)  02/08/19 198 lb (89.8 kg)  01/27/19 188 lb (85.3 kg)    General: Patient appears comfortable at rest. HEENT: Conjunctiva normal, left periocular ecchymosis, wearing a mask. Neck: Supple, no elevated JVP or carotid bruits, no thyromegaly. Lungs: Clear to auscultation, nonlabored breathing at rest. Cardiac: Regular rate and rhythm with ectopy, no S3 or significant systolic murmur. Abdomen: Soft, nontender, bowel sounds present. Extremities: Chronic appearing but generally improved lower leg edema, distal  pulses 1-2+. Skin: Warm and dry. Musculoskeletal: No kyphosis. Neuropsychiatric: Alert and oriented x3, affect grossly appropriate.  ECG:  An ECG dated 02/08/2019 was personally reviewed today and demonstrated:  Sinus rhythm with PVCs including couplet, left anterior fascicular block.  Recent Labwork: 10/19/2018: ALT 8; AST 12; TSH 8.680 02/08/2019: BUN 10; Creatinine, Ser 0.94; Hemoglobin 12.6; Magnesium 1.7; Platelets 252; Potassium 3.1; Sodium 136     Component Value Date/Time   CHOL 156 10/19/2018 1133   TRIG 89 10/19/2018 1133   HDL 45 10/19/2018 1133   CHOLHDL 3.5 10/19/2018 1133   CHOLHDL 3.3 04/11/2007 0600   VLDL 10 04/11/2007 0600   LDLCALC 94 10/19/2018 1133    Other Studies Reviewed Today:  Echocardiogram 09/01/2018:  1. The left ventricle has severely reduced  systolic function, with an ejection fraction of 25-30%. The cavity size was severely dilated. Left ventricular diastolic Doppler parameters are consistent with pseudonormalization. Elevated left ventricular  end-diastolic pressure Left ventricular diffuse hypokinesis.  2. The right ventricle has normal systolic function. The cavity was normal. There is no increase in right ventricular wall thickness. Right ventricular systolic pressure is normal.  3. Left atrial size was mildly dilated.  4. The aortic valve is tricuspid. Aortic valve regurgitation is trivial by color flow Doppler.  5. The aorta is normal in size and structure.  Assessment and Plan:  1.  Nonischemic cardiomyopathy with chronic combined heart failure, LVEF 25 to 30%.  Weight is up compared to early January.  I asked her to increase Lasix to 80 mg twice daily for the next few days until her weight gets back down into the high 180s.  Continue magnesium and potassium supplements. Continue Aldactone, Lanoxin, and Coreg.  2.  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  Possibility of PVC ablation has been discussed although deferred at  this time.  She continues to work with Dr. Rayann Paul on this.  3.  Intermittent falls and dizziness.  Recent episode occurred after device shock with VF.  She does tend to run a relatively low blood pressure which has limited therapy for her cardiomyopathy (not on ARB or Entresto).  Blood pressure is stable today.  4.  St. Jude biventricular ICD in place.  Recent device shock and syncope due to VF.  Continue beta-blocker.  She is on potassium and magnesium supplements, recheck BMET in the next few weeks.  Keep virtual encounter with Dr. Rayann Paul pending on Monday.  May need to consider antiarrhythmic therapy particularly if PVC ablation is not pursued.  Medication Adjustments/Labs and Tests Ordered: Current medicines are reviewed at length with the patient today.  Concerns regarding medicines are outlined above.   Tests Ordered: Orders Placed This Encounter  Procedures  . Basic metabolic panel    Medication Changes: Meds ordered this encounter  Medications  . furosemide (LASIX) 80 MG tablet    Sig: Take 1 tablet (80 mg total) by mouth every morning. & 40 mg in the pm    Dispense:  135 tablet    Refill:  3    02/12/2019 change in directions    Disposition:  Follow up 2 months in the [place] office.  Signed, Satira Sark, MD, [facility] 02/12/2019 12:13 PM    [facility] [place] , [place], [place] [address] Phone: [phone]; Fax: [phone]

## 2019-02-12 ENCOUNTER — Other Ambulatory Visit: Payer: Self-pay

## 2019-02-12 ENCOUNTER — Encounter: Payer: Self-pay | Admitting: Cardiology

## 2019-02-12 ENCOUNTER — Ambulatory Visit (INDEPENDENT_AMBULATORY_CARE_PROVIDER_SITE_OTHER): Payer: Medicare Other | Admitting: Cardiology

## 2019-02-12 VITALS — BP 109/68 | HR 70 | Ht 62.5 in | Wt 195.4 lb

## 2019-02-12 DIAGNOSIS — I493 Ventricular premature depolarization: Secondary | ICD-10-CM | POA: Diagnosis not present

## 2019-02-12 DIAGNOSIS — I428 Other cardiomyopathies: Secondary | ICD-10-CM

## 2019-02-12 DIAGNOSIS — I5022 Chronic systolic (congestive) heart failure: Secondary | ICD-10-CM | POA: Diagnosis not present

## 2019-02-12 DIAGNOSIS — Z95810 Presence of automatic (implantable) cardiac defibrillator: Secondary | ICD-10-CM

## 2019-02-12 MED ORDER — FUROSEMIDE 80 MG PO TABS: 80.0000 mg | ORAL_TABLET | ORAL | 3 refills | Status: DC

## 2019-02-12 NOTE — Patient Instructions (Addendum)
Medication Instructions:    Your physician has recommended you make the following change in your medication:   Take furosemide 80 mg twice daily until your weight is in the high 180's, then reduce back to furosemide 80 mg in the morning and 40 mg in the evening  Continue other medications the same  Labwork:  Your physician recommends that you return for lab work in: 2 weeks to check your BMET. Please have this done at [facility] or [facility] in [place]  Testing/Procedures:  NONE  Follow-Up:  Your physician recommends that you schedule a follow-up appointment in: 2 months (office).  Keep virtual visit with Dr. [name] this Monday.   Any Other Special Instructions Will Be Listed Below (If Applicable).  If you need a refill on your cardiac medications before your next appointment, please call your pharmacy.

## 2019-02-15 ENCOUNTER — Other Ambulatory Visit: Payer: Self-pay

## 2019-02-15 ENCOUNTER — Telehealth (INDEPENDENT_AMBULATORY_CARE_PROVIDER_SITE_OTHER): Payer: Medicare Other | Admitting: Internal Medicine

## 2019-02-15 ENCOUNTER — Encounter: Payer: Self-pay | Admitting: Internal Medicine

## 2019-02-15 VITALS — BP 123/76 | HR 70 | Ht 62.5 in | Wt 192.4 lb

## 2019-02-15 DIAGNOSIS — I480 Paroxysmal atrial fibrillation: Secondary | ICD-10-CM | POA: Diagnosis not present

## 2019-02-15 DIAGNOSIS — I428 Other cardiomyopathies: Secondary | ICD-10-CM

## 2019-02-15 DIAGNOSIS — I4901 Ventricular fibrillation: Secondary | ICD-10-CM | POA: Diagnosis not present

## 2019-02-15 DIAGNOSIS — I493 Ventricular premature depolarization: Secondary | ICD-10-CM

## 2019-02-15 NOTE — Progress Notes (Signed)
Electrophysiology TeleHealth Note  Due to national recommendations of social distancing due to [place] 19, an audio telehealth visit is felt to be most appropriate for this patient at this time.  Verbal consent was obtained by me for the telehealth visit today.  The patient does not have capability for a virtual visit.  A phone visit is therefore required today.   Date:  02/15/2019   ID:  Melissa Paul, DOB [birth year], MRN [id number]  Location: patient's home  Provider location:  [place] [facility]  Evaluation Performed: Follow-up visit  PCP:  Sharion Balloon, FNP   Electrophysiologist:  Dr [name]  Chief Complaint:  ICD shock  History of Present Illness:    Melissa Paul is a 74 y.o. female who presents via telehealth conferencing today.  She has had worsening chest tightness and SOB recently. Her lasix was increased with some improved diuresis.  She is followed by Dr Domenic Polite.   Currently, she is very limited by chest tightness and SOB.  She feels like she may pass out due to SOB.  She had polymorphic VT/ VF 02/07/2019.  She was found to have K 3.1.  Today, she denies symptoms of palpitations,  lower extremity edema, dizziness, presyncope, or syncope.  The patient is otherwise without complaint today.   Past Medical History:  Diagnosis Date  . Anxiety   . Chronic systolic heart failure ([place])   . Constipation   . Cyst of right kidney 11/12/2017  . Glucose intolerance (pre-diabetes)   . Hyperlipemia   . Hypothyroidism   . LBBB (left bundle branch block)   . Mitral regurgitation    Mild to Moderate  . Nonischemic cardiomyopathy ([place])    LVEF 35-40% by echo 8/13  . Paroxysmal atrial fibrillation ([facility])    Brief episodes by device interrogation  . UTI (urinary tract infection)    Recurrent  . Ventricular tachycardia ([place])    ICD therapy for VT    Past Surgical History:  Procedure Laterality Date  . APPENDECTOMY    . BIV ICD GENERATOR CHANGEOUT N/A 07/11/2016   Procedure: BiV ICD Generator Changeout;  Surgeon: Thompson Grayer, MD;  Location: [facility];  Service: Cardiovascular;  Laterality: N/A;  . CARDIAC DEFIBRILLATOR PLACEMENT  09/2009   St. Jude BiV ICD by Dr. Sandria Manly class III  . CHOLECYSTECTOMY    . has one ovary left    . KNEE ARTHROSCOPY     Left  . LEFT AND RIGHT HEART CATHETERIZATION WITH CORONARY ANGIOGRAM N/A 04/12/2014   Procedure: LEFT AND RIGHT HEART CATHETERIZATION WITH CORONARY ANGIOGRAM;  Surgeon: Leonie Man, MD;  Location: [facility] CATH LAB;  Service: Cardiovascular;  Laterality: N/A;  . Left breast biopsy x 2     no maliganancy  . TOTAL ABDOMINAL HYSTERECTOMY     (R) ovary removed    Current Outpatient Medications  Medication Sig Dispense Refill  . acetaminophen (TYLENOL) 500 MG tablet Take 500 mg by mouth every 6 (six) hours as needed for headache.    . albuterol (PROVENTIL) (2.5 MG/3ML) 0.083% nebulizer solution Take 3 mLs (2.5 mg total) by nebulization every 6 (six) hours as needed for wheezing or shortness of breath. 150 mL 1  . aspirin EC 81 MG tablet Take 81 mg by mouth daily.    . carvedilol (COREG) 6.25 MG tablet Take 3 tablets (18.75 mg total) by mouth 2 (two) times daily. 180 tablet 10  . digoxin (LANOXIN) 0.125 MG tablet Take 1 tablet (0.125  mg total) by mouth daily. 90 tablet 1  . estradiol (ESTRACE) 0.1 MG/GM vaginal cream Place 1 Applicatorful vaginally at bedtime.    . furosemide (LASIX) 80 MG tablet Take 1 tablet (80 mg total) by mouth every morning. & 40 mg in the pm 135 tablet 3  . levothyroxine (SYNTHROID) 88 MCG tablet Take 1 tablet (88 mcg total) by mouth daily before breakfast. 90 tablet 1  . Magnesium 400 MG TABS Take 200 mg by mouth 2 (two) times daily.    . metFORMIN (GLUCOPHAGE) 500 MG tablet Take 1 tablet (500 mg total) by mouth 2 (two) times daily. Needs to be seen for future refills. 60 tablet 0  . pantoprazole (PROTONIX) 40 MG tablet TAKE 1 TABLET BY MOUTH ONCE DAILY BEFORE BREAKFAST 30  tablet 5  . spironolactone (ALDACTONE) 25 MG tablet Take 1 tablet (25 mg total) by mouth daily. 90 tablet 1  . potassium chloride SA (KLOR-CON) 20 MEQ tablet Take 1 tablet (20 mEq total) by mouth 2 (two) times daily for 5 days. 10 tablet 0   No current facility-administered medications for this visit.    Allergies:   Buspar [buspirone], Lexapro [escitalopram oxalate], Bactrim, Cephalexin, Chlorhexidine gluconate, Clindamycin, Contrast media [iodinated diagnostic agents], Doxycycline, Latex, Levofloxacin, Nitrofurantoin monohyd macro, and Penicillins   Social History:  The patient  reports that she quit smoking about 23 years ago. Her smoking use included cigarettes. She started smoking about 69 years ago. She has a 6.00 pack-year smoking history. She has never used smokeless tobacco. She reports that she does not drink alcohol or use drugs.   Family History:  The patient's family history includes CVA in her mother; Cancer in her father; Heart Problems in her mother; Heart disease in her maternal grandfather and maternal grandmother.   ROS:  Please see the history of present illness.   All other systems are personally reviewed and negative.    Exam:    Vital Signs:  BP 123/76   Pulse 70   Ht 5' 2.5" (1.588 m)   Wt 192 lb 6.4 oz (87.3 kg)   BMI 34.63 kg/m   Well sounding, alert and conversant   Labs/Other Tests and Data Reviewed:    Recent Labs: 10/19/2018: ALT 8; TSH 8.680 02/08/2019: BUN 10; Creatinine, Ser 0.94; Hemoglobin 12.6; Magnesium 1.7; Platelets 252; Potassium 3.1; Sodium 136   Wt Readings from Last 3 Encounters:  02/15/19 192 lb 6.4 oz (87.3 kg)  02/12/19 195 lb 6.4 oz (88.6 kg)  02/08/19 198 lb (89.8 kg)     Last device remote is reviewed from [facility] PDF which reveals normal device function, VF episode is reviewed   ASSESSMENT & PLAN:    1.  Polymorphic VT/VF  Occurred in the setting of low K Would need to keep K >3.9 and Mg > 1.9 going forward No driving x 6  months Normal ICD function  2. nonischemic CM with worsening CHF symptoms Will need further medicine optimization as BP allows Could consider entresto, though low BP has limited Dr McDowell's enthusiasm previously May benefit from referral to the CHF clinic for further optimization Though her PVC burden is elevated, her decline is out of proportion to this. I would not advise ablation currently due to her recent decompensation We could consider adding amiodarone if she has more sustained arrhythmias  3. afib Well controlled currently She has declined anticoagulation   Follow-up closely with Dr Aundra Dubin Return to see [name] in 2-4 weeks for further arrhythmia and  CHF management.   Patient Risk:  after full review of this patients clinical status, I feel that they are at high risk at this time.  She is at risk for further decompensation/ hospitalization/ death.  A high level of decision making was required for this encounter.  Today, I have spent 25 minutes with the patient with telehealth technology discussing arrhythmia management .    Army Fossa, MD  02/15/2019 11:20 AM     [facility] [address] [place] [address] [phone] (office) [phone] (fax)

## 2019-02-16 NOTE — Progress Notes (Signed)
No ICM remote transmission received for 02/02/2019 and next ICM transmission scheduled for 03/01/2019.

## 2019-02-22 NOTE — Progress Notes (Signed)
Left message to return call 

## 2019-02-25 ENCOUNTER — Other Ambulatory Visit: Payer: Self-pay | Admitting: Cardiology

## 2019-02-25 ENCOUNTER — Other Ambulatory Visit: Payer: Self-pay | Admitting: Family

## 2019-02-25 ENCOUNTER — Other Ambulatory Visit: Payer: Self-pay | Admitting: Family Medicine

## 2019-02-26 LAB — BASIC METABOLIC PANEL
BUN/Creatinine Ratio: 12 (calc) (ref 6–22)
BUN: 13 mg/dL (ref 7–25)
CO2: 27 mmol/L (ref 20–32)
Calcium: 9.1 mg/dL (ref 8.6–10.4)
Chloride: 100 mmol/L (ref 98–110)
Creat: 1.11 mg/dL — ABNORMAL HIGH (ref 0.60–0.93)
Glucose, Bld: 156 mg/dL — ABNORMAL HIGH (ref 65–139)
Potassium: 3.9 mmol/L (ref 3.5–5.3)
Sodium: 137 mmol/L (ref 135–146)

## 2019-03-02 ENCOUNTER — Telehealth: Payer: Self-pay | Admitting: *Deleted

## 2019-03-02 NOTE — Telephone Encounter (Signed)
-----   Message from Satira Sark, MD sent at 02/26/2019  8:17 AM EST ----- Results reviewed.  Creatinine 1.11 and potassium 3.9.  Continue with current treatment plan.

## 2019-03-02 NOTE — Telephone Encounter (Signed)
Patient informed and copy sent to PCP. 

## 2019-03-03 ENCOUNTER — Ambulatory Visit (INDEPENDENT_AMBULATORY_CARE_PROVIDER_SITE_OTHER): Payer: Medicare Other | Admitting: Gastroenterology

## 2019-03-04 ENCOUNTER — Telehealth: Payer: Self-pay | Admitting: Internal Medicine

## 2019-03-04 NOTE — Telephone Encounter (Signed)
I spoke to the patient's daughter Terrence Dupont and mentioned to her that it would be ok to accompany the patient to her visit due to memory loss.  She verbalized understanding.

## 2019-03-04 NOTE — Telephone Encounter (Signed)
Patient's daughter requesting to come with her mother to her appt on 03/09/19 with Barrington Ellison. States that her mother has a hard time retaining information.

## 2019-03-05 ENCOUNTER — Ambulatory Visit (INDEPENDENT_AMBULATORY_CARE_PROVIDER_SITE_OTHER): Payer: Medicare Other

## 2019-03-05 DIAGNOSIS — I5022 Chronic systolic (congestive) heart failure: Secondary | ICD-10-CM

## 2019-03-05 DIAGNOSIS — Z95810 Presence of automatic (implantable) cardiac defibrillator: Secondary | ICD-10-CM

## 2019-03-05 NOTE — Progress Notes (Signed)
EPIC Encounter for ICM Monitoring  Patient Name: Melissa Paul is a 74 y.o. female Date: 03/05/2019 Primary Care Physican: Sharion Balloon, [facility] [facility] [place] Electrophysiologist: [place] Bi-V Pacing:  86% 02/12/2019 Office Weight: 195lbs         Transmission reviewed.   CorVueThoracic impedancenormal.  Prescribed:Furosemide80 mgTake 1 tablet (80 mg total) by mouth every morning and 40 mg every evening.  Labs: 02/25/2019 Creatinine 1.11, BUN 13. Potassium 3.9, Sodium 137 02/08/2019 Creatinine 0.94, BUN 10, Potassium 3.1, Sodium 136, GFR 59->60 12/21/2018 Creatinine 0.94, BUN 13, Potassium 3.5, Sodium 136 12/03/2018 Creatinine 1.04, BUN 13, Potassium 3.8, Sodium 138, GFR 53-61  Recommendations:  None  Follow-up plan: ICM clinic phone appointment on 04/20/2019.   91 day device clinic remote transmission 04/19/2019.  Office appt 03/09/2019 with [name], PA, 03/19/2019 with Dr Aundra Dubin as new patient and 03/31/2019 with Dr [name].    Copy of ICM check sent to Dr. [name].   3 month ICM trend: 03/02/2019    1 Year ICM trend:       [name], RN 03/05/2019 4:28 PM

## 2019-03-08 NOTE — Progress Notes (Addendum)
Electrophysiology Office Note Date: 03/09/2019  ID:  Melissa Paul, DOB 1945/09/14, MRN [id number]  PCP: Sharion Balloon, FNP Primary Cardiologist: Rozann Lesches, MD Electrophysiologist: Dr. [name]   CC: Routine ICD follow-up  Melissa Paul is a 74 y.o. female seen today for Dr. [name].  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 Since last being seen by our team, the patient reports doing about the same. She continues to have atypical chest tightness, most of the time.  This does not get better or worse with exertion, and this is chronic (mentioned in notes as far back as 2015). She had normal cath in 2016. She remains fatigued. No further ICD shocks or NSVT. Chronic orthopnea and + bendopnea. She smoked for > 20 years. She snores heavily but has never had a sleep study. Daughter denies sounds of "apnea".   Device History: St. Jude BiV ICD implanted 09/2009, gen change 06/2016 for chronic systolic CHF History of appropriate therapy: Yes History of AAD therapy: No   Past Medical History:  Diagnosis Date  . Anxiety   . Chronic systolic heart failure ([place])   . Constipation   . Cyst of right kidney 11/12/2017  . Glucose intolerance (pre-diabetes)   . Hyperlipemia   . Hypothyroidism   . LBBB (left bundle branch block)   . Mitral regurgitation    Mild to Moderate  . Nonischemic cardiomyopathy ([place])    LVEF 35-40% by echo 8/13  . Paroxysmal atrial fibrillation ([facility])    Brief episodes by device interrogation  . UTI (urinary tract infection)    Recurrent  . Ventricular tachycardia ([place])    ICD therapy for VT   Past Surgical History:  Procedure Laterality Date  . APPENDECTOMY    . BIV ICD GENERATOR CHANGEOUT N/A 07/11/2016   Procedure: BiV ICD Generator Changeout;  Surgeon: Thompson Grayer, MD;  Location: [facility];  Service: Cardiovascular;  Laterality: N/A;  . CARDIAC  DEFIBRILLATOR PLACEMENT  09/2009   St. Jude BiV ICD by Dr. Sandria Manly class III  . CHOLECYSTECTOMY    . has one ovary left    . KNEE ARTHROSCOPY     Left  . LEFT AND RIGHT HEART CATHETERIZATION WITH CORONARY ANGIOGRAM N/A 04/12/2014   Procedure: LEFT AND RIGHT HEART CATHETERIZATION WITH CORONARY ANGIOGRAM;  Surgeon: Leonie Man, MD;  Location: [facility] CATH [facility];  Service: Cardiovascular;  Laterality: N/A;  . Left breast biopsy x 2     no maliganancy  . TOTAL ABDOMINAL HYSTERECTOMY     (R) ovary removed    Current Outpatient Medications  Medication Sig Dispense Refill  . acetaminophen (TYLENOL) 500 MG tablet Take 500 mg by mouth every 6 (six) hours as needed for headache.    . albuterol (PROVENTIL) (2.5 MG/3ML) 0.083% nebulizer solution Take 3 mLs (2.5 mg total) by nebulization every 6 (six) hours as needed for wheezing or shortness of breath. 150 mL 1  . aspirin EC 81 MG tablet Take 81 mg by mouth daily.    . carvedilol (COREG) 6.25 MG tablet Take 3 tablets (18.75 mg total) by mouth 2 (two) times daily. 180 tablet 10  . digoxin (LANOXIN) 0.125 MG tablet Take 1 tablet by mouth once daily 90 tablet 0  . estradiol (ESTRACE) 0.1 MG/GM vaginal cream Place 1 Applicatorful vaginally at bedtime.    . furosemide (LASIX) 80 MG tablet Take 1 tablet (80 mg total)  by mouth every morning. & 40 mg in the pm 135 tablet 3  . levothyroxine (SYNTHROID) 88 MCG tablet Take 1 tablet (88 mcg total) by mouth daily before breakfast. 90 tablet 1  . metFORMIN (GLUCOPHAGE) 500 MG tablet TAKE 1 TABLET BY MOUTH TWICE DAILY NEEDS  TO  BE  SEEN  FOR  FURTHER  REFILLS 60 tablet 0  . pantoprazole (PROTONIX) 40 MG tablet TAKE 1 TABLET BY MOUTH ONCE DAILY BEFORE BREAKFAST 30 tablet 5  . spironolactone (ALDACTONE) 25 MG tablet Take 1 tablet by mouth once daily 90 tablet 0   No current facility-administered medications for this visit.    Allergies:   Buspar [buspirone], Lexapro [escitalopram oxalate], Bactrim, Cephalexin,  Chlorhexidine gluconate, Clindamycin, Contrast media [iodinated diagnostic agents], Doxycycline, Latex, Levofloxacin, Nitrofurantoin monohyd macro, and Penicillins   Social History: Social History   Socioeconomic History  . Marital status: Widowed    Spouse name: Not on file  . Number of children: Not on file  . Years of education: Not on file  . Highest education level: Not on file  Occupational History  . Not on file  Tobacco Use  . Smoking status: Former Smoker    Packs/day: 0.30    Years: 20.00    Pack years: 6.00    Types: Cigarettes    Start date: 08/03/1949    Quit date: 01/22/1996    Years since quitting: 23.1  . Smokeless tobacco: Never Used  Substance and Sexual Activity  . Alcohol use: No    Alcohol/week: 0.0 standard drinks  . Drug use: No  . Sexual activity: Not on file  Other Topics Concern  . Not on file  Social History Narrative  . Not on file   Social Determinants of Health   Financial Resource Strain:   . Difficulty of Paying Living Expenses: Not on file  Food Insecurity:   . Worried About Charity fundraiser in the Last Year: Not on file  . Ran Out of Food in the Last Year: Not on file  Transportation Needs:   . Lack of Transportation (Medical): Not on file  . Lack of Transportation (Non-Medical): Not on file  Physical Activity:   . Days of Exercise per Week: Not on file  . Minutes of Exercise per Session: Not on file  Stress:   . Feeling of Stress : Not on file  Social Connections:   . Frequency of Communication with Friends and Family: Not on file  . Frequency of Social Gatherings with Friends and Family: Not on file  . Attends Religious Services: Not on file  . Active Member of Clubs or Organizations: Not on file  . Attends Archivist Meetings: Not on file  . Marital Status: Not on file  Intimate Partner Violence:   . Fear of Current or Ex-Partner: Not on file  . Emotionally Abused: Not on file  . Physically Abused: Not on file    . Sexually Abused: Not on file    Family History: Family History  Problem Relation Age of Onset  . CVA Mother        multiple  . Heart Problems Mother        "weak heart"  . Heart disease Maternal Grandmother   . Heart disease Maternal Grandfather   . Cancer Father        Stomach and eshpogus  . Diabetes Neg Hx   . Hypertension Neg Hx   . Coronary artery disease Neg Hx  Review of Systems: All other systems reviewed and are otherwise negative except as noted above.   Physical Exam: Vitals:   03/09/19 1117  BP: (!) 108/58  Pulse: 84  SpO2: 97%  Weight: 192 lb 12.8 oz (87.5 kg)  Height: 5' 2.5" (1.588 m)     GEN- The patient is fatigued and somewhat chronically ill appearing, alert and oriented x 3 today.   HEENT: normocephalic, atraumatic; sclera clear, conjunctiva pink; hearing intact; oropharynx clear; neck supple, JVP difficult due to body habitus, but does not appear higher than 6-7 cm Lymph- no cervical lymphadenopathy Lungs- Diminished throughout. No wheezes, rales, rhonchi Heart- Regular rate and rhythm, no murmurs, rubs or gallops, PMI not laterally displaced GI- soft, non-tender, non-distended, bowel sounds present, no hepatosplenomegaly Extremities- no clubbing or cyanosis. Trace to 1+ ankle edema. DP/PT/radial pulses 2+ bilaterally MS- no significant deformity or atrophy Skin- warm and dry, no rash or lesion; ICD pocket well healed Psych- euthymic mood, full affect Neuro- strength and sensation are intact  ICD interrogation- reviewed in detail today,  See PACEART report  EKG:  EKG is not ordered today.  Recent Labs: 10/19/2018: ALT 8; TSH 8.680 02/08/2019: Hemoglobin 12.6; Magnesium 1.7; Platelets 252 02/25/2019: BUN 13; Creat 1.11; Potassium 3.9; Sodium 137   Wt Readings from Last 3 Encounters:  03/09/19 192 lb 12.8 oz (87.5 kg)  02/15/19 192 lb 6.4 oz (87.3 kg)  02/12/19 195 lb 6.4 oz (88.6 kg)     Other studies Reviewed: Additional studies/  records that were reviewed today include: Echo 09/01/2018 shows LVEF 35%, cath notes from 2016, most recent labwork, previous EP office notes, previous lab reports.   Assessment and Plan:  1.  Chronic systolic dysfunction s/p St. Jude CRT-D , NICM Echo 09/01/2018 LVEF 25-30% Normal stress 03/2014, no ischemia Volume status appears stable on exam despite complaints of bednopnea and ? Orthopnea.   NYHA status III+ Normal ICD function See [facility] report She has no room to add Entresto today. Her pressure is somewhat soft today, but could consider low dose losartan. Continue coreg, digoxin, and spironolactone.  She has been referred to CHF clinic with appointment later this month. ? If she would benefit from a RHC to further clarify her SOB. She had a 20+ history of smoking and snores heavily. ? If component of Pulm HTN. Of note, RHC in 03/2014 with Peak PA pressure of only 22 and wedge of 11 at that time. Normal CO.   2. Polymorphic VT 01/2019 In setting of GI illness and low K.  BMET and Mg today.  No driving x 6 months Labs stable 2/4. Refuses labs today with close follow up in CHF clinic.   3. Afib Well controlled currently She has previously and repeatedly declined anticoagulation CHA2DS2VASC of at least 6.    4. PVCs Have previously discussed PVC ablation to improve CRT optimization. Per Dr. [name], her decline is out of proportion to this (8.2% PVCs). She BiV paces 88% of the time. Previously used rate of 80 to attempt to suppress, but she declines a re-attempt at this. States she felt horrible. Could consider amiodarone if further sustained arrythmias, but at this time, it is not clear that this is the driving process behind her decline.   5. Snoring Per patient and daughter she snores significantly. She refuses sleep study at this time, as she knows the treatment would be CPAP, and refuses to even consider using one.   She has gone back and forth with  accepting. She has requested  this continue to be put off with issues with her daughters health. She knows to call the office if she would wish to scheduled.   Current medicines are reviewed at length with the patient today.   The patient does not have concerns regarding her medicines.  The following changes were made today:  none  Labs/ tests ordered today include:  No orders of the defined types were placed in this encounter.   Disposition:   Follow up with me in 2 months to touch base again after CHF visit with Dr. Aundra Dubin.  Jacalyn Lefevre, PA-C  03/09/2019 12:03 PM  [address] [address] [phone] (office) [phone] (fax)

## 2019-03-09 ENCOUNTER — Encounter: Payer: Self-pay | Admitting: Student

## 2019-03-09 ENCOUNTER — Ambulatory Visit (INDEPENDENT_AMBULATORY_CARE_PROVIDER_SITE_OTHER): Payer: Medicare Other | Admitting: Student

## 2019-03-09 ENCOUNTER — Other Ambulatory Visit: Payer: Self-pay

## 2019-03-09 VITALS — BP 108/58 | HR 84 | Ht 62.5 in | Wt 192.8 lb

## 2019-03-09 DIAGNOSIS — I428 Other cardiomyopathies: Secondary | ICD-10-CM | POA: Diagnosis not present

## 2019-03-09 DIAGNOSIS — I5022 Chronic systolic (congestive) heart failure: Secondary | ICD-10-CM | POA: Diagnosis not present

## 2019-03-09 DIAGNOSIS — I493 Ventricular premature depolarization: Secondary | ICD-10-CM | POA: Diagnosis not present

## 2019-03-09 DIAGNOSIS — I4901 Ventricular fibrillation: Secondary | ICD-10-CM | POA: Diagnosis not present

## 2019-03-09 DIAGNOSIS — R29818 Other symptoms and signs involving the nervous system: Secondary | ICD-10-CM

## 2019-03-09 LAB — CUP PACEART INCLINIC DEVICE CHECK
Battery Remaining Longevity: 45 mo
Brady Statistic RA Percent Paced: 78 %
Brady Statistic RV Percent Paced: 88 %
Date Time Interrogation Session: 20210216141753
HighPow Impedance: 79.875
Implantable Lead Implant Date: 20110912
Implantable Lead Implant Date: 20110912
Implantable Lead Implant Date: 20120710
Implantable Lead Location: 753858
Implantable Lead Location: 753859
Implantable Lead Location: 753860
Implantable Lead Model: 7122
Implantable Pulse Generator Implant Date: 20180621
Lead Channel Impedance Value: 362.5 Ohm
Lead Channel Impedance Value: 475 Ohm
Lead Channel Impedance Value: 625 Ohm
Lead Channel Pacing Threshold Amplitude: 0.5 V
Lead Channel Pacing Threshold Amplitude: 0.5 V
Lead Channel Pacing Threshold Amplitude: 0.75 V
Lead Channel Pacing Threshold Amplitude: 0.75 V
Lead Channel Pacing Threshold Amplitude: 0.75 V
Lead Channel Pacing Threshold Pulse Width: 0.5 ms
Lead Channel Pacing Threshold Pulse Width: 0.5 ms
Lead Channel Pacing Threshold Pulse Width: 0.5 ms
Lead Channel Pacing Threshold Pulse Width: 0.5 ms
Lead Channel Pacing Threshold Pulse Width: 0.5 ms
Lead Channel Sensing Intrinsic Amplitude: 12 mV
Lead Channel Sensing Intrinsic Amplitude: 2.9 mV
Lead Channel Setting Pacing Amplitude: 2 V
Lead Channel Setting Pacing Amplitude: 2 V
Lead Channel Setting Pacing Amplitude: 2 V
Lead Channel Setting Pacing Pulse Width: 0.5 ms
Lead Channel Setting Pacing Pulse Width: 0.5 ms
Lead Channel Setting Sensing Sensitivity: 0.5 mV
Pulse Gen Serial Number: 9761374

## 2019-03-09 NOTE — Patient Instructions (Signed)
Medication Instructions:  NONE *If you need a refill on your cardiac medications before your next appointment, please call your pharmacy*  Lab Work: NONE If you have labs (blood work) drawn today and your tests are completely normal, you will receive your results only by: Marland Kitchen MyChart Message (if you have MyChart) OR . A paper copy in the mail If you have any lab test that is abnormal or we need to change your treatment, we will call you to review the results.  Testing/Procedures: NONE  Follow-Up: At [facility], you and your health needs are our priority.  As part of our continuing mission to provide you with exceptional heart care, we have created designated Provider Care Teams.  These Care Teams include your primary Cardiologist (physician) and Advanced Practice Providers (APPs -  Physician Assistants and Nurse Practitioners) who all work together to provide you with the care you need, when you need it.  Your next appointment:   2 month(s)  The format for your next appointment:   In Person  Provider:   [name], PA  Other Instructions Remote monitoring is used to monitor your  ICD from home. This monitoring reduces the number of office visits required to check your device to one time per year. It allows [place] to keep an eye on the functioning of your device to ensure it is working properly. You are scheduled for a device check from home on 04/19/19. You may send your transmission at any time that day. If you have a wireless device, the transmission will be sent automatically. After your physician reviews your transmission, you will receive a postcard with your next transmission date.

## 2019-03-15 ENCOUNTER — Other Ambulatory Visit: Payer: Self-pay

## 2019-03-15 ENCOUNTER — Ambulatory Visit (INDEPENDENT_AMBULATORY_CARE_PROVIDER_SITE_OTHER): Payer: Medicare Other | Admitting: Family

## 2019-03-15 ENCOUNTER — Encounter: Payer: Self-pay | Admitting: Family

## 2019-03-15 VITALS — BP 129/75 | HR 76 | Temp 97.1°F | Ht 62.5 in | Wt 190.2 lb

## 2019-03-15 DIAGNOSIS — K047 Periapical abscess without sinus: Secondary | ICD-10-CM | POA: Diagnosis not present

## 2019-03-15 DIAGNOSIS — N390 Urinary tract infection, site not specified: Secondary | ICD-10-CM

## 2019-03-15 MED ORDER — METRONIDAZOLE 500 MG PO TABS: 500.0000 mg | ORAL_TABLET | Freq: Three times a day (TID) | ORAL | 0 refills | Status: DC

## 2019-03-15 MED ORDER — CEFUROXIME AXETIL 250 MG PO TABS: 250.0000 mg | ORAL_TABLET | Freq: Two times a day (BID) | ORAL | 0 refills | Status: DC

## 2019-03-15 NOTE — Patient Instructions (Signed)
Dental Abscess  A dental abscess is a collection of pus in or around a tooth that results from an infection. An abscess can cause pain in the affected area as well as other symptoms. Treatment is important to help with symptoms and to prevent the infection from spreading. What are the causes? This condition is caused by a bacterial infection around the root of the tooth that involves the inner part of the tooth (pulp). It may result from:  Severe tooth decay.  Trauma to the tooth, such as a broken or chipped tooth, that allows bacteria to enter into the pulp.  Severe gum disease around a tooth. What increases the risk? This condition is more likely to develop in males. It is also more likely to develop in people who:  Have dental decay (cavities).  Eat sugary snacks between meals.  Use tobacco products.  Have diabetes.  Have a weakened disease-fighting system (immune system).  Do not brush and care for their teeth regularly. What are the signs or symptoms? Symptoms of this condition include:  Severe pain in and around the infected tooth.  Swelling and redness around the infected tooth, in the mouth, or in the face.  Tenderness.  Pus drainage.  Bad breath.  Bitter taste in the mouth.  Difficulty swallowing.  Difficulty opening the mouth.  Nausea.  Vomiting.  Chills.  Swollen neck glands.  Fever. How is this diagnosed? This condition is diagnosed based on:  Your symptoms and your medical and dental history.  An examination of the infected tooth. During the exam, your dentist may tap on the infected tooth. You may also have X-rays of the affected area. How is this treated? This condition is treated by getting rid of the infection. This may be done with:  Incision and drainage. This procedure is done by making an incision in the abscess to drain out the pus. Removing pus is the first priority in treating an abscess.  Antibiotic medicines. These may be used  in certain situations.  Antibacterial mouth rinse.  A root canal. This may be performed to save the tooth. Your dentist accesses the visible part of your tooth (crown) with a drill and removes any damaged pulp. Then the space is filled and sealed off.  Tooth extraction. The tooth is pulled out if it cannot be saved by other treatment. You may also receive treatment for pain, such as:  Acetaminophen or NSAIDs.  Gels that contain a numbing medicine.  An injection to block the pain near your nerve. Follow these instructions at home: Medicines  Take over-the-counter and prescription medicines only as told by your dentist.  If you were prescribed an antibiotic, take it as told by your dentist. Do not stop taking the antibiotic even if you start to feel better.  If you were prescribed a gel that contains a numbing medicine, use it exactly as told in the directions. Do not use these gels for children who are younger than 1 years of age.  Do not drive or use heavy machinery while taking prescription pain medicine. General instructions  Rinse out your mouth often with salt water to relieve pain or swelling. To make a salt-water mixture, completely dissolve -1 tsp of salt in 1 cup of warm water.  Eat a soft diet while your abscess is healing.  Drink enough fluid to keep your urine pale yellow.  Do not apply heat to the outside of your mouth.  Do not use any products that contain nicotine or  tobacco, such as cigarettes and e-cigarettes. If you need help quitting, ask your health care provider.  Keep all follow-up visits as told by your dentist. This is important. How is this prevented?  Brush your teeth every morning and night with fluoride toothpaste. Floss one time each day.  Get regularly scheduled dental cleanings.  Consider having a dental sealant applied on teeth that have deep holes (caries).  Drink fluoridated water regularly. This includes most tap water. Check the label  on bottled water to see if it contains fluoride.  Drink water instead of sugary drinks.  Eat healthy meals and snacks.  Wear a mouth guard or face shield to protect your teeth while playing sports. Contact a health care provider if:  Your pain is worse and is not helped by medicine. Get help right away if:  You have a fever or chills.  Your symptoms suddenly get worse.  You have a very bad headache.  You have problems breathing or swallowing.  You have trouble opening your mouth.  You have swelling in your neck or around your eye. Summary  A dental abscess is a collection of pus in or around a tooth that results from an infection.  A dental abscess may result from severe tooth decay, trauma to the tooth, or severe gum disease around a tooth.  Symptoms include severe pain, swelling, redness, and drainage of pus in and around the infected tooth.  The first priority in treating a dental abscess is to drain out the pus. Treatment may also involve removing damage inside the tooth (root canal) or pulling out (extracting) the tooth. This information is not intended to replace advice given to you by your health care provider. Make sure you discuss any questions you have with your health care provider. Document Revised: 12/20/2016 Document Reviewed: 09/09/2016 Elsevier Patient Education  2020 Elsevier Inc.  

## 2019-03-15 NOTE — Progress Notes (Signed)
Subjective:    Patient ID: Melissa Paul, female    DOB: [birth year], 74 y.o.   MRN: [id number]  Chief Complaint  Patient presents with  . Pelvic Pressure  . Dysuria  . Facial Swelling    jaw pain   PT presents to the office today with UTI symptoms of pressure and frequency that started over the last month that has worsen.   She is also complaining of left lower gum pain and swelling that started over the last week. She states if she touches it is a 10 out 10.  Dysuria  This is a recurrent problem. The current episode started 1 to 4 weeks ago. The problem occurs intermittently. The problem has been waxing and waning. Quality: pressure. The pain is at a severity of 5/10. The pain is moderate. There has been no fever. Associated symptoms include flank pain, hesitancy, nausea and urgency. Pertinent negatives include no discharge, frequency, hematuria or vomiting. She has tried increased fluids for the symptoms. The treatment provided mild relief.      Review of Systems  Gastrointestinal: Positive for nausea. Negative for vomiting.  Genitourinary: Positive for dysuria, flank pain, hesitancy and urgency. Negative for frequency and hematuria.  All other systems reviewed and are negative.      Objective:   Physical Exam Vitals reviewed.  Constitutional:      General: She is not in acute distress.    Appearance: She is well-developed.  HENT:     Head: Normocephalic and atraumatic.  Eyes:     Pupils: Pupils are equal, round, and reactive to light.  Neck:     Thyroid: No thyromegaly.  Cardiovascular:     Rate and Rhythm: Normal rate and regular rhythm.     Heart sounds: Normal heart sounds. No murmur.  Pulmonary:     Effort: Pulmonary effort is normal. No respiratory distress.     Breath sounds: Normal breath sounds. No wheezing.  Abdominal:     General: Bowel sounds are normal. There is no distension.     Palpations: Abdomen is soft.     Tenderness: There is no abdominal  tenderness.  Musculoskeletal:        General: No tenderness. Normal range of motion.     Cervical back: Normal range of motion and neck supple.  Skin:    General: Skin is warm and dry.  Neurological:     Mental Status: She is alert and oriented to person, place, and time.     Cranial Nerves: No cranial nerve deficit.     Deep Tendon Reflexes: Reflexes are normal and symmetric.  Psychiatric:        Behavior: Behavior normal.        Thought Content: Thought content normal.        Judgment: Judgment normal.     BP 129/75   Pulse 76   Temp (!) 97.1 F (36.2 C) (Temporal)   Ht 5' 2.5" (1.588 m)   Wt 190 lb 3.2 oz (86.3 kg)   SpO2 97%   BMI 34.23 kg/m        Assessment & Plan:  Melissa Paul comes in today with chief complaint of Pelvic Pressure, Dysuria, and Facial Swelling (jaw pain)   Diagnosis and orders addressed:  1. Urinary tract infection without hematuria, site unspecified - Urinalysis, Complete - Urine Culture - metroNIDAZOLE (FLAGYL) 500 MG tablet; Take 1 tablet (500 mg total) by mouth 3 (three) times daily.  Dispense: 21 tablet; Refill: 0 -  cefUROXime (CEFTIN) 250 MG tablet; Take 1 tablet (250 mg total) by mouth 2 (two) times daily with a meal.  Dispense: 14 tablet; Refill: 0  2. Abscessed tooth - Urine Culture - metroNIDAZOLE (FLAGYL) 500 MG tablet; Take 1 tablet (500 mg total) by mouth 3 (three) times daily.  Dispense: 21 tablet; Refill: 0 - cefUROXime (CEFTIN) 250 MG tablet; Take 1 tablet (250 mg total) by mouth 2 (two) times daily with a meal.  Dispense: 14 tablet; Refill: 0   Force fluids Culture pending Pt has multiple allergies, she states she can tolerate Keflex in the past Pt will make dental appt  Tylenol as needed    Evelina Dun, FNP

## 2019-03-18 ENCOUNTER — Other Ambulatory Visit: Payer: Self-pay | Admitting: Family

## 2019-03-18 ENCOUNTER — Telehealth (HOSPITAL_COMMUNITY): Payer: Self-pay

## 2019-03-18 LAB — URINE CULTURE

## 2019-03-18 NOTE — Telephone Encounter (Signed)
COVID-19 pre-appointment screening questions: ANSWERED BY SISTER EMMA   Do you have a history of COVID-19 or a positive test result in the past 7-10 days? NO   To the best of your knowledge, have you been in close contact with anyone with a confirmed diagnosis of COVID 19? NO   Have you had any one or more of the following: Fever, chills, cough, shortness of breath (out of the normal for you) or any flu-like symptoms? NO   Are you experiencing any of the following symptoms that is new or out of usual for you: NO   . Ear, nose or throat discomfort . Sore throat . Headache . Muscle Pain . Diarrhea  . Loss of taste or smell   Reviewed all the following with patient: REVIEWED . Use of hand sanitizer when entering the building . Everyone is required to wear a mask in the building, if you do not have a mask we are happy to provide you with one when you arrive . NO Visitor guidelines   If patient answers YES to any of questions they must change to a virtual visit and place note in comments about symptoms

## 2019-03-19 ENCOUNTER — Ambulatory Visit (HOSPITAL_COMMUNITY)
Admission: RE | Admit: 2019-03-19 | Discharge: 2019-03-19 | Disposition: A | Discharge status: Home / Self Care | Payer: Medicare Other | Source: Ambulatory Visit | Attending: Cardiology | Admitting: Cardiology

## 2019-03-19 ENCOUNTER — Encounter (HOSPITAL_COMMUNITY): Payer: Self-pay | Admitting: Cardiology

## 2019-03-19 ENCOUNTER — Other Ambulatory Visit: Payer: Self-pay

## 2019-03-19 VITALS — BP 122/90 | HR 70 | Wt 193.8 lb

## 2019-03-19 DIAGNOSIS — I428 Other cardiomyopathies: Secondary | ICD-10-CM | POA: Diagnosis not present

## 2019-03-19 DIAGNOSIS — Z79899 Other long term (current) drug therapy: Secondary | ICD-10-CM | POA: Diagnosis not present

## 2019-03-19 DIAGNOSIS — E785 Hyperlipidemia, unspecified: Secondary | ICD-10-CM | POA: Insufficient documentation

## 2019-03-19 DIAGNOSIS — I5022 Chronic systolic (congestive) heart failure: Secondary | ICD-10-CM | POA: Insufficient documentation

## 2019-03-19 DIAGNOSIS — Z8249 Family history of ischemic heart disease and other diseases of the circulatory system: Secondary | ICD-10-CM | POA: Insufficient documentation

## 2019-03-19 DIAGNOSIS — I480 Paroxysmal atrial fibrillation: Secondary | ICD-10-CM

## 2019-03-19 DIAGNOSIS — I493 Ventricular premature depolarization: Secondary | ICD-10-CM | POA: Insufficient documentation

## 2019-03-19 DIAGNOSIS — Z95810 Presence of automatic (implantable) cardiac defibrillator: Secondary | ICD-10-CM | POA: Diagnosis not present

## 2019-03-19 DIAGNOSIS — Z7984 Long term (current) use of oral hypoglycemic drugs: Secondary | ICD-10-CM | POA: Diagnosis not present

## 2019-03-19 DIAGNOSIS — E039 Hypothyroidism, unspecified: Secondary | ICD-10-CM | POA: Diagnosis not present

## 2019-03-19 DIAGNOSIS — Z87891 Personal history of nicotine dependence: Secondary | ICD-10-CM | POA: Diagnosis not present

## 2019-03-19 DIAGNOSIS — Z7982 Long term (current) use of aspirin: Secondary | ICD-10-CM | POA: Insufficient documentation

## 2019-03-19 LAB — CBC
HCT: 37.9 % (ref 36.0–46.0)
Hemoglobin: 12.6 g/dL (ref 12.0–15.0)
MCH: 29.9 pg (ref 26.0–34.0)
MCHC: 33.2 g/dL (ref 30.0–36.0)
MCV: 90 fL (ref 80.0–100.0)
Platelets: 304 10*3/uL (ref 150–400)
RBC: 4.21 MIL/uL (ref 3.87–5.11)
RDW: 13.3 % (ref 11.5–15.5)
WBC: 10.7 10*3/uL — ABNORMAL HIGH (ref 4.0–10.5)
nRBC: 0 % (ref 0.0–0.2)

## 2019-03-19 LAB — BASIC METABOLIC PANEL
Anion gap: 11 (ref 5–15)
BUN: 12 mg/dL (ref 8–23)
CO2: 30 mmol/L (ref 22–32)
Calcium: 8.5 mg/dL — ABNORMAL LOW (ref 8.9–10.3)
Chloride: 95 mmol/L — ABNORMAL LOW (ref 98–111)
Creatinine, Ser: 1.2 mg/dL — ABNORMAL HIGH (ref 0.44–1.00)
GFR calc Af Amer: 51 mL/min — ABNORMAL LOW (ref >60)
GFR calc non Af Amer: 44 mL/min — ABNORMAL LOW (ref >60)
Glucose, Bld: 135 mg/dL — ABNORMAL HIGH (ref 70–99)
Potassium: 3.4 mmol/L — ABNORMAL LOW (ref 3.5–5.1)
Sodium: 136 mmol/L (ref 135–145)

## 2019-03-19 LAB — DIGOXIN LEVEL: Digoxin Level: 1.1 ng/mL (ref 0.8–2.0)

## 2019-03-19 MED ORDER — PREDNISONE 50 MG PO TABS: ORAL_TABLET | ORAL | 0 refills | Status: DC

## 2019-03-19 MED ORDER — DIPHENHYDRAMINE HCL 50 MG PO CAPS: ORAL_CAPSULE | ORAL | 0 refills | Status: DC

## 2019-03-19 MED ORDER — SPIRONOLACTONE 25 MG PO TABS: 25.0000 mg | ORAL_TABLET | Freq: Every day | ORAL | 3 refills | Status: DC

## 2019-03-19 MED ORDER — SACUBITRIL-VALSARTAN 24-26 MG PO TABS: 1.0000 | ORAL_TABLET | Freq: Two times a day (BID) | ORAL | 3 refills | Status: DC

## 2019-03-19 NOTE — Patient Instructions (Signed)
START Entresto 24/26mg  (1 tab) twice a day   CHANGE the time you take Spironolactone. Take at night!    Labs today We will only contact you if something comes back abnormal or we need to make some changes. Otherwise no news is good news!   Your physician recommends that you schedule a follow-up appointment in: 2 weeks after Cath procedure with Dr Aundra Dubin    [facility] [place] [id number] [place] [address] Dept: [place]: [place]  03/19/2019  You are scheduled for a Cardiac Catheterization on Friday, March 5 with Dr. Loralie Champagne.  1. Please arrive at the [facility] (Main Entrance A) at [facility]: [address] [place], [place] [address] at 10:00 AM (This time is two hours before your procedure to ensure your preparation). Free valet parking service is available.   Special note: Every effort is made to have your procedure done on time. Please understand that emergencies sometimes delay scheduled procedures.  2. Diet: Do not eat solid foods after midnight.  The patient may have clear liquids until 5am upon the day of the procedure.  3. Labs: Today in office  You will need a pre procedure COVID test     WHEN:  Tuesday, March 2nd, 2021 at 10:45am WHERE:  [facility]        [place]  [id number]  This is a drive thru testing site, you will remain in your car. Be sure to get in the line FOR PROCEDURES Once you have been swabbed you will need to remain home in quarantine until you return for your procedure.   4. Medication instructions in preparation for your procedure:    Contrast Allergy: Yes, Please take Prednisone 50mg  by mouth at: Thirteen hours prior to cath 11:00pm on Thursday Seven hours prior to cath 5:00am on Friday And prior to leaving home please take last dose of Prednisone 50mg  and Benadryl 50mg  by  mouth.    Hold Lasix and Spironolactone on the morning of your procedure   Do not take Diabetes Med Glucophage (Metformin) on the day of the procedure and HOLD 48 HOURS AFTER THE PROCEDURE.  On the morning of your procedure, take your Aspirin and any morning medicines NOT listed above.  You may use sips of water.  5. Plan for one night stay--bring personal belongings. 6. Bring a current list of your medications and current insurance cards. 7. You MUST have a responsible person to drive you home. 8. Someone MUST be with you the first 24 hours after you arrive home or your discharge will be delayed. 9. Please wear clothes that are easy to get on and off and wear slip-on shoes.  Thank you for allowing [place] to care for you!   -- [facility]

## 2019-03-20 NOTE — H&P (View-Only) (Signed)
PCP: Sharion Balloon, FNP Cardiology: Dr. Domenic Polite EP: Dr. [name] HF Cardiology: Dr. Aundra Dubin  74 y.o. with history of CHF/nonischemic cardiomyopathy, paroxysmal atrial fibrillation, and VT referred by Dr. [name] for CHF evaluation. Cardiomyopathy was diagnosed years ago, she had an echo in 8/13 with EF 35-40%.  Due to LBBB, she had St Jude BiV ICD placed.  Cath in 2016 showed no significant coronary disease. Most recent echo in 8/20 showed a severely dilated LV with EF 25-30%.    In 1/21, she had an episode of polymorphic VT with syncope, ICD discharged appropriately. This was in the setting of GI illness with low K, amiodarone was not started.   She has had a clinical decline over the last year.  She has struggled recently with dyspnea.  Using rolling walker, had to stop several times walking into the office today.  She is short of breath walking around her house.  She has bendopnea and chronic 2 pillow orthopnea.  No lightheadedness.  She does report chest tightness along with the dyspnea when she walks.  Weight has trended up but only mildly.  She is careful with sodium. No palpitations.   She snores and has daytime sleepiness, but has not wanted sleep study and says she would not wear CPAP. She has not smoked for 20+ years. She has a history of paroxysmal atrial fibrillation (not recently) but has refused anticoagulation.   ECG (1/21, personally reviewed): A-BiV sequential pacing  Labs (2/21): K 3.9, creatinine 1.11  PMH: 1. Chronic systolic CHF: Nonischemic cardiomyopathy.  St Jude BiV ICD.  - Echo (8/13): EF 35-40%.  - LHC/RHC (3/16): No significant CAD.  Mean RA 4, PA 22/8, mean PCWP 11, CI 3.4.  - Echo (8/20): EF 25-30%, severe LV dilation, normal RV.  2. 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  3. Hyperlipidemia 4. Hypothyroidism 5. Atrial fibrillation: Paroxysmal.  She has refused anticoagulation.  6. PVCs: 8.2% noted on last device check, did not tolerate increase in ventricular rate to try  to suppress.  Social History   Socioeconomic History  . Marital status: Widowed    Spouse name: Not on file  . Number of children: Not on file  . Years of education: Not on file  . Highest education level: Not on file  Occupational History  . Not on file  Tobacco Use  . Smoking status: Former Smoker    Packs/day: 0.30    Years: 20.00    Pack years: 6.00    Types: Cigarettes    Start date: 08/03/1949    Quit date: 01/22/1996    Years since quitting: 23.1  . Smokeless tobacco: Never Used  Substance and Sexual Activity  . Alcohol use: No    Alcohol/week: 0.0 standard drinks  . Drug use: No  . Sexual activity: Not on file  Other Topics Concern  . Not on file  Social History Narrative  . Not on file   Social Determinants of Health   Financial Resource Strain:   . Difficulty of Paying Living Expenses: Not on file  Food Insecurity:   . Worried About Charity fundraiser in the Last Year: Not on file  . Ran Out of Food in the Last Year: Not on file  Transportation Needs:   . Lack of Transportation (Medical): Not on file  . Lack of Transportation (Non-Medical): Not on file  Physical Activity:   . Days of Exercise per Week: Not on file  . Minutes of Exercise per Session: Not on file  Stress:   . Feeling of Stress : Not on file  Social Connections:   . Frequency of Communication with Friends and Family: Not on file  . Frequency of Social Gatherings with Friends and Family: Not on file  . Attends Religious Services: Not on file  . Active Member of Clubs or Organizations: Not on file  . Attends Archivist Meetings: Not on file  . Marital Status: Not on file  Intimate Partner Violence:   . Fear of Current or Ex-Partner: Not on file  . Emotionally Abused: Not on file  . Physically Abused: Not on file  . Sexually Abused: Not on file   Family History  Problem Relation Age of Onset  . CVA Mother        multiple  . Heart Problems Mother        "weak heart"  .  Heart disease Maternal Grandmother   . Heart disease Maternal Grandfather   . Cancer Father        Stomach and eshpogus  . Diabetes Neg Hx   . Hypertension Neg Hx   . Coronary artery disease Neg Hx    ROS: All systems reviewed and negative except as per HPI.   Current Outpatient Medications  Medication Sig Dispense Refill  . acetaminophen (TYLENOL) 500 MG tablet Take 500 mg by mouth every 6 (six) hours as needed for headache.    . albuterol (PROVENTIL) (2.5 MG/3ML) 0.083% nebulizer solution Take 3 mLs (2.5 mg total) by nebulization every 6 (six) hours as needed for wheezing or shortness of breath. 150 mL 1  . aspirin EC 81 MG tablet Take 81 mg by mouth daily.    . carvedilol (COREG) 6.25 MG tablet Take 3 tablets (18.75 mg total) by mouth 2 (two) times daily. 180 tablet 10  . cefUROXime (CEFTIN) 250 MG tablet Take 1 tablet (250 mg total) by mouth 2 (two) times daily with a meal. 14 tablet 0  . digoxin (LANOXIN) 0.125 MG tablet Take 1 tablet by mouth once daily 90 tablet 0  . estradiol (ESTRACE) 0.1 MG/GM vaginal cream Place 1 Applicatorful vaginally at bedtime.    . furosemide (LASIX) 80 MG tablet Take 80 mg by mouth 2 (two) times daily.    Marland Kitchen levothyroxine (SYNTHROID) 88 MCG tablet Take 1 tablet (88 mcg total) by mouth daily before breakfast. 90 tablet 1  . metFORMIN (GLUCOPHAGE) 500 MG tablet TAKE 1 TABLET BY MOUTH TWICE DAILY NEEDS  TO  BE  SEEN  FOR  FURTHER  REFILLS 60 tablet 0  . metroNIDAZOLE (FLAGYL) 500 MG tablet Take 1 tablet (500 mg total) by mouth 3 (three) times daily. 21 tablet 0  . pantoprazole (PROTONIX) 40 MG tablet TAKE 1 TABLET BY MOUTH ONCE DAILY BEFORE BREAKFAST 30 tablet 5  . spironolactone (ALDACTONE) 25 MG tablet Take 1 tablet (25 mg total) by mouth at bedtime. 90 tablet 3  . diphenhydrAMINE (BENADRYL) 50 MG capsule Take before you leave your home on the day of your procedure 1 capsule 0  . predniSONE (DELTASONE) 50 MG tablet Take 1 tab 13 hours, 7 hours and 1 hour  before your procedure. 3 tablet 0  . sacubitril-valsartan (ENTRESTO) 24-26 MG Take 1 tablet by mouth 2 (two) times daily. 60 tablet 3   No current facility-administered medications for this encounter.   BP 122/90   Pulse 70   Wt 87.9 kg (193 lb 12.8 oz)   SpO2 97%   BMI 34.88 kg/m  General: NAD  Neck: JVP 7-8 cm, no thyromegaly or thyroid nodule.  Lungs: Clear to auscultation bilaterally with normal respiratory effort. CV: Nondisplaced PMI.  Heart regular S1/S2, no S3/S4, no murmur.  Trace ankle edema.  No carotid bruit.  Normal pedal pulses.  Abdomen: Soft, nontender, no hepatosplenomegaly, no distention.  Skin: Intact without lesions or rashes.  Neurologic: Alert and oriented x 3.  Psych: Normal affect. Extremities: No clubbing or cyanosis.  HEENT: Normal.   Assessment/Plan: 1. Chronic systolic CHF: Nonischemic cardiomyopathy by cath in 2016.  Low EF back to at least 2013.  Most recent echo in 8/20 with EF 25-30%, severe LV dilation.  St Jude BiV ICD. Of note, she has frequent PVCs, about 8% on last device check per EP notes.  She is only BiV pacing 88% of the time and did not tolerate increase in ventricular rate to try to suppress.  She is significantly symptomatic, NYHA class IIIb, though she does not appear significantly volume overloaded on exam. BP stable, no lightheadedness.  - I will continue digoxin, check level today.  - Continue Coreg 18.75 mg bid.  - Continue current Lasix 80 mg bid for now.  BMET today.  - Add Entresto 24/26 bid, repeat BMET in 10 days.  Her BP look ok today and she says that BP has been higher recently . - I think she needs RHC/LHC to clarify her symptoms given marked dyspnea without marked exam evidence for volume overload.  Will need to assess filling pressures, PA pressure, and cardiac output (?low output).  Given exertional symptoms, think it would be worthwhile to repeat coronary assessment as well.  We discussed risks/benefits and she agrees to  procedure.  - May be worthwhile to try to increase percentage of BiV pacing.  I doubt PVCs caused the cardiomyopathy (only 8% last check) but she would probably benefit from a higher percentage of BiV pacing.  Would consider Zio patch in future to requantify PVCs and will need to think about amiodarone.  - If cath is unremarkable, will need to consider lung workup with PFTs etc.  2. VT: Recent polymorphic VT in setting of low K/GI illness.  Syncope with appropriate ICD discharge. She was not started on an anti-arrhythmic.  - BMET/Mg today.  - See discussion above about re-quantifying PVCs and considering amiodarone for suppression to allow increased BiV pacing percentage.  3. Suspect OSA: She would not consider CPAP.  4. Atrial fibrillation: Paroxysmal.  She is in NSR currently. She has refused anticoagulation in the past.   Loralie Champagne 03/20/2019

## 2019-03-20 NOTE — Progress Notes (Signed)
PCP: Sharion Balloon, FNP Cardiology: Dr. Domenic Polite EP: Dr. [name] HF Cardiology: Dr. Aundra Dubin  74 y.o. with history of CHF/nonischemic cardiomyopathy, paroxysmal atrial fibrillation, and VT referred by Dr. [name] for CHF evaluation. Cardiomyopathy was diagnosed years ago, she had an echo in 8/13 with EF 35-40%.  Due to LBBB, she had St Jude BiV ICD placed.  Cath in 2016 showed no significant coronary disease. Most recent echo in 8/20 showed a severely dilated LV with EF 25-30%.    In 1/21, she had an episode of polymorphic VT with syncope, ICD discharged appropriately. This was in the setting of GI illness with low K, amiodarone was not started.   She has had a clinical decline over the last year.  She has struggled recently with dyspnea.  Using rolling walker, had to stop several times walking into the office today.  She is short of breath walking around her house.  She has bendopnea and chronic 2 pillow orthopnea.  No lightheadedness.  She does report chest tightness along with the dyspnea when she walks.  Weight has trended up but only mildly.  She is careful with sodium. No palpitations.   She snores and has daytime sleepiness, but has not wanted sleep study and says she would not wear CPAP. She has not smoked for 20+ years. She has a history of paroxysmal atrial fibrillation (not recently) but has refused anticoagulation.   ECG (1/21, personally reviewed): A-BiV sequential pacing  Labs (2/21): K 3.9, creatinine 1.11  PMH: 1. Chronic systolic CHF: Nonischemic cardiomyopathy.  St Jude BiV ICD.  - Echo (8/13): EF 35-40%.  - LHC/RHC (3/16): No significant CAD.  Mean RA 4, PA 22/8, mean PCWP 11, CI 3.4.  - Echo (8/20): EF 25-30%, severe LV dilation, normal RV.  2. 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  3. Hyperlipidemia 4. Hypothyroidism 5. Atrial fibrillation: Paroxysmal.  She has refused anticoagulation.  6. PVCs: 8.2% noted on last device check, did not tolerate increase in ventricular rate to try  to suppress.  Social History   Socioeconomic History  . Marital status: Widowed    Spouse name: Not on file  . Number of children: Not on file  . Years of education: Not on file  . Highest education level: Not on file  Occupational History  . Not on file  Tobacco Use  . Smoking status: Former Smoker    Packs/day: 0.30    Years: 20.00    Pack years: 6.00    Types: Cigarettes    Start date: 08/03/1949    Quit date: 01/22/1996    Years since quitting: 23.1  . Smokeless tobacco: Never Used  Substance and Sexual Activity  . Alcohol use: No    Alcohol/week: 0.0 standard drinks  . Drug use: No  . Sexual activity: Not on file  Other Topics Concern  . Not on file  Social History Narrative  . Not on file   Social Determinants of Health   Financial Resource Strain:   . Difficulty of Paying Living Expenses: Not on file  Food Insecurity:   . Worried About Charity fundraiser in the Last Year: Not on file  . Ran Out of Food in the Last Year: Not on file  Transportation Needs:   . Lack of Transportation (Medical): Not on file  . Lack of Transportation (Non-Medical): Not on file  Physical Activity:   . Days of Exercise per Week: Not on file  . Minutes of Exercise per Session: Not on file  Stress:   . Feeling of Stress : Not on file  Social Connections:   . Frequency of Communication with Friends and Family: Not on file  . Frequency of Social Gatherings with Friends and Family: Not on file  . Attends Religious Services: Not on file  . Active Member of Clubs or Organizations: Not on file  . Attends Archivist Meetings: Not on file  . Marital Status: Not on file  Intimate Partner Violence:   . Fear of Current or Ex-Partner: Not on file  . Emotionally Abused: Not on file  . Physically Abused: Not on file  . Sexually Abused: Not on file   Family History  Problem Relation Age of Onset  . CVA Mother        multiple  . Heart Problems Mother        "weak heart"  .  Heart disease Maternal Grandmother   . Heart disease Maternal Grandfather   . Cancer Father        Stomach and eshpogus  . Diabetes Neg Hx   . Hypertension Neg Hx   . Coronary artery disease Neg Hx    ROS: All systems reviewed and negative except as per HPI.   Current Outpatient Medications  Medication Sig Dispense Refill  . acetaminophen (TYLENOL) 500 MG tablet Take 500 mg by mouth every 6 (six) hours as needed for headache.    . albuterol (PROVENTIL) (2.5 MG/3ML) 0.083% nebulizer solution Take 3 mLs (2.5 mg total) by nebulization every 6 (six) hours as needed for wheezing or shortness of breath. 150 mL 1  . aspirin EC 81 MG tablet Take 81 mg by mouth daily.    . carvedilol (COREG) 6.25 MG tablet Take 3 tablets (18.75 mg total) by mouth 2 (two) times daily. 180 tablet 10  . cefUROXime (CEFTIN) 250 MG tablet Take 1 tablet (250 mg total) by mouth 2 (two) times daily with a meal. 14 tablet 0  . digoxin (LANOXIN) 0.125 MG tablet Take 1 tablet by mouth once daily 90 tablet 0  . estradiol (ESTRACE) 0.1 MG/GM vaginal cream Place 1 Applicatorful vaginally at bedtime.    . furosemide (LASIX) 80 MG tablet Take 80 mg by mouth 2 (two) times daily.    Marland Kitchen levothyroxine (SYNTHROID) 88 MCG tablet Take 1 tablet (88 mcg total) by mouth daily before breakfast. 90 tablet 1  . metFORMIN (GLUCOPHAGE) 500 MG tablet TAKE 1 TABLET BY MOUTH TWICE DAILY NEEDS  TO  BE  SEEN  FOR  FURTHER  REFILLS 60 tablet 0  . metroNIDAZOLE (FLAGYL) 500 MG tablet Take 1 tablet (500 mg total) by mouth 3 (three) times daily. 21 tablet 0  . pantoprazole (PROTONIX) 40 MG tablet TAKE 1 TABLET BY MOUTH ONCE DAILY BEFORE BREAKFAST 30 tablet 5  . spironolactone (ALDACTONE) 25 MG tablet Take 1 tablet (25 mg total) by mouth at bedtime. 90 tablet 3  . diphenhydrAMINE (BENADRYL) 50 MG capsule Take before you leave your home on the day of your procedure 1 capsule 0  . predniSONE (DELTASONE) 50 MG tablet Take 1 tab 13 hours, 7 hours and 1 hour  before your procedure. 3 tablet 0  . sacubitril-valsartan (ENTRESTO) 24-26 MG Take 1 tablet by mouth 2 (two) times daily. 60 tablet 3   No current facility-administered medications for this encounter.   BP 122/90   Pulse 70   Wt 87.9 kg (193 lb 12.8 oz)   SpO2 97%   BMI 34.88 kg/m  General: NAD  Neck: JVP 7-8 cm, no thyromegaly or thyroid nodule.  Lungs: Clear to auscultation bilaterally with normal respiratory effort. CV: Nondisplaced PMI.  Heart regular S1/S2, no S3/S4, no murmur.  Trace ankle edema.  No carotid bruit.  Normal pedal pulses.  Abdomen: Soft, nontender, no hepatosplenomegaly, no distention.  Skin: Intact without lesions or rashes.  Neurologic: Alert and oriented x 3.  Psych: Normal affect. Extremities: No clubbing or cyanosis.  HEENT: Normal.   Assessment/Plan: 1. Chronic systolic CHF: Nonischemic cardiomyopathy by cath in 2016.  Low EF back to at least 2013.  Most recent echo in 8/20 with EF 25-30%, severe LV dilation.  St Jude BiV ICD. Of note, she has frequent PVCs, about 8% on last device check per EP notes.  She is only BiV pacing 88% of the time and did not tolerate increase in ventricular rate to try to suppress.  She is significantly symptomatic, NYHA class IIIb, though she does not appear significantly volume overloaded on exam. BP stable, no lightheadedness.  - I will continue digoxin, check level today.  - Continue Coreg 18.75 mg bid.  - Continue current Lasix 80 mg bid for now.  BMET today.  - Add Entresto 24/26 bid, repeat BMET in 10 days.  Her BP look ok today and she says that BP has been higher recently . - I think she needs RHC/LHC to clarify her symptoms given marked dyspnea without marked exam evidence for volume overload.  Will need to assess filling pressures, PA pressure, and cardiac output (?low output).  Given exertional symptoms, think it would be worthwhile to repeat coronary assessment as well.  We discussed risks/benefits and she agrees to  procedure.  - May be worthwhile to try to increase percentage of BiV pacing.  I doubt PVCs caused the cardiomyopathy (only 8% last check) but she would probably benefit from a higher percentage of BiV pacing.  Would consider Zio patch in future to requantify PVCs and will need to think about amiodarone.  - If cath is unremarkable, will need to consider lung workup with PFTs etc.  2. VT: Recent polymorphic VT in setting of low K/GI illness.  Syncope with appropriate ICD discharge. She was not started on an anti-arrhythmic.  - BMET/Mg today.  - See discussion above about re-quantifying PVCs and considering amiodarone for suppression to allow increased BiV pacing percentage.  3. Suspect OSA: She would not consider CPAP.  4. Atrial fibrillation: Paroxysmal.  She is in NSR currently. She has refused anticoagulation in the past.   Loralie Champagne 03/20/2019

## 2019-03-22 ENCOUNTER — Other Ambulatory Visit (HOSPITAL_COMMUNITY): Payer: Self-pay

## 2019-03-22 DIAGNOSIS — I5022 Chronic systolic (congestive) heart failure: Secondary | ICD-10-CM

## 2019-03-22 MED ORDER — SODIUM CHLORIDE 0.9% FLUSH: 3.0000 mL | Freq: Two times a day (BID) | INTRAVENOUS | Status: DC

## 2019-03-23 ENCOUNTER — Other Ambulatory Visit (HOSPITAL_COMMUNITY)
Admission: RE | Admit: 2019-03-23 | Discharge: 2019-03-23 | Disposition: A | Discharge status: Home / Self Care | Payer: Medicare Other | Source: Ambulatory Visit | Attending: Cardiology | Admitting: Cardiology

## 2019-03-23 DIAGNOSIS — Z01812 Encounter for preprocedural laboratory examination: Secondary | ICD-10-CM | POA: Insufficient documentation

## 2019-03-23 DIAGNOSIS — Z20822 Contact with and (suspected) exposure to covid-19: Secondary | ICD-10-CM | POA: Insufficient documentation

## 2019-03-23 LAB — SARS CORONAVIRUS 2 (TAT 6-24 HRS): SARS Coronavirus 2: NEGATIVE

## 2019-03-25 ENCOUNTER — Encounter: Payer: Self-pay | Admitting: Family Medicine

## 2019-03-25 ENCOUNTER — Other Ambulatory Visit: Payer: Self-pay

## 2019-03-25 ENCOUNTER — Ambulatory Visit (INDEPENDENT_AMBULATORY_CARE_PROVIDER_SITE_OTHER): Payer: Medicare Other | Admitting: Family Medicine

## 2019-03-25 DIAGNOSIS — J069 Acute upper respiratory infection, unspecified: Secondary | ICD-10-CM

## 2019-03-25 DIAGNOSIS — R300 Dysuria: Secondary | ICD-10-CM

## 2019-03-25 LAB — MICROSCOPIC EXAMINATION
Bacteria, UA: NONE SEEN
RBC, Urine: NONE SEEN /hpf (ref 0–2)
Renal Epithel, UA: NONE SEEN /hpf

## 2019-03-25 LAB — URINALYSIS, COMPLETE
Bilirubin, UA: NEGATIVE
Glucose, UA: NEGATIVE
Ketones, UA: NEGATIVE
Leukocytes,UA: NEGATIVE
Nitrite, UA: NEGATIVE
Protein,UA: NEGATIVE
RBC, UA: NEGATIVE
Specific Gravity, UA: 1.015 (ref 1.005–1.030)
Urobilinogen, Ur: 0.2 mg/dL (ref 0.2–1.0)
pH, UA: 6.5 (ref 5.0–7.5)

## 2019-03-25 NOTE — Addendum Note (Signed)
Addended by: Earlene Plater on: 03/25/2019 04:27 PM   Modules accepted: Orders

## 2019-03-25 NOTE — Progress Notes (Signed)
Virtual Visit via telephone Note Due to COVID-19 pandemic this visit was conducted virtually. This visit type was conducted due to national recommendations for restrictions regarding the COVID-19 Pandemic (e.g. social distancing, sheltering in place) in an effort to limit this patient's exposure and mitigate transmission in our community. All issues noted in this document were discussed and addressed.  A physical exam was not performed with this format.   I connected with Melissa Paul on 03/25/2019 at 0955 by telephone and verified that I am speaking with the correct person using two identifiers. Melissa Paul is currently located at home and family is currently with them during visit. The provider, Monia Pouch, FNP is located in their office at time of visit.  I discussed the limitations, risks, security and privacy concerns of performing an evaluation and management service by telephone and the availability of in person appointments. I also discussed with the patient that there may be a patient responsible charge related to this service. The patient expressed understanding and agreed to proceed.  Subjective:  Patient ID: Melissa Paul, female    DOB: [birth year], 74 y.o.   MRN: [id number]  Chief Complaint:  Urinary Tract Infection and URI   HPI: Melissa Paul is a 73 y.o. female presenting on 03/25/2019 for Urinary Tract Infection and URI   Urinary Tract Infection  This is a recurrent problem. The current episode started more than 1 month ago. The problem has been waxing and waning. The quality of the pain is described as burning and aching. The pain is at a severity of 3/10. The pain is mild. There has been no fever. She is not sexually active. There is no history of pyelonephritis. Associated symptoms include frequency, hesitancy and urgency. Pertinent negatives include no chills, discharge, flank pain, hematuria, nausea, possible pregnancy, sweats or vomiting. She has tried  antibiotics for the symptoms. The treatment provided mild relief.  URI  This is a new problem. The current episode started yesterday. The problem has been waxing and waning. There has been no fever. Associated symptoms include congestion, coughing, dysuria and rhinorrhea. Pertinent negatives include no abdominal pain, chest pain, diarrhea, ear pain, headaches, joint pain, joint swelling, nausea, neck pain, plugged ear sensation, rash, sinus pain, sneezing, sore throat, swollen glands, vomiting or wheezing. She has tried nothing for the symptoms.     Relevant past medical, surgical, family, and social history reviewed and updated as indicated.  Allergies and medications reviewed and updated.   Past Medical History:  Diagnosis Date  . Anxiety   . Chronic systolic heart failure ([place])   . Constipation   . Cyst of right kidney 11/12/2017  . Glucose intolerance (pre-diabetes)   . Hyperlipemia   . Hypothyroidism   . LBBB (left bundle branch block)   . Mitral regurgitation    Mild to Moderate  . Nonischemic cardiomyopathy ([place])    LVEF 35-40% by echo 8/13  . Paroxysmal atrial fibrillation ([facility])    Brief episodes by device interrogation  . UTI (urinary tract infection)    Recurrent  . Ventricular tachycardia ([place])    ICD therapy for VT    Past Surgical History:  Procedure Laterality Date  . APPENDECTOMY    . BIV ICD GENERATOR CHANGEOUT N/A 07/11/2016   Procedure: BiV ICD Generator Changeout;  Surgeon: Thompson Grayer, MD;  Location: [facility];  Service: Cardiovascular;  Laterality: N/A;  . CARDIAC DEFIBRILLATOR PLACEMENT  09/2009   St. Jude BiV ICD by Dr.  Allred-NYHA class III  . CHOLECYSTECTOMY    . has one ovary left    . KNEE ARTHROSCOPY     Left  . LEFT AND RIGHT HEART CATHETERIZATION WITH CORONARY ANGIOGRAM N/A 04/12/2014   Procedure: LEFT AND RIGHT HEART CATHETERIZATION WITH CORONARY ANGIOGRAM;  Surgeon: Leonie Man, MD;  Location: [facility];  Service:  Cardiovascular;  Laterality: N/A;  . Left breast biopsy x 2     no maliganancy  . TOTAL ABDOMINAL HYSTERECTOMY     (R) ovary removed    Social History   Socioeconomic History  . Marital status: Widowed    Spouse name: Not on file  . Number of children: Not on file  . Years of education: Not on file  . Highest education level: Not on file  Occupational History  . Not on file  Tobacco Use  . Smoking status: Former Smoker    Packs/day: 0.30    Years: 20.00    Pack years: 6.00    Types: Cigarettes    Start date: 08/03/1949    Quit date: 01/22/1996    Years since quitting: 23.1  . Smokeless tobacco: Never Used  Substance and Sexual Activity  . Alcohol use: No    Alcohol/week: 0.0 standard drinks  . Drug use: No  . Sexual activity: Not on file  Other Topics Concern  . Not on file  Social History Narrative  . Not on file   Social Determinants of Health   Financial Resource Strain:   . Difficulty of Paying Living Expenses: Not on file  Food Insecurity:   . Worried About Charity fundraiser in the Last Year: Not on file  . Ran Out of Food in the Last Year: Not on file  Transportation Needs:   . Lack of Transportation (Medical): Not on file  . Lack of Transportation (Non-Medical): Not on file  Physical Activity:   . Days of Exercise per Week: Not on file  . Minutes of Exercise per Session: Not on file  Stress:   . Feeling of Stress : Not on file  Social Connections:   . Frequency of Communication with Friends and Family: Not on file  . Frequency of Social Gatherings with Friends and Family: Not on file  . Attends Religious Services: Not on file  . Active Member of Clubs or Organizations: Not on file  . Attends Archivist Meetings: Not on file  . Marital Status: Not on file  Intimate Partner Violence:   . Fear of Current or Ex-Partner: Not on file  . Emotionally Abused: Not on file  . Physically Abused: Not on file  . Sexually Abused: Not on file     Outpatient Encounter Medications as of 03/25/2019  Medication Sig  . acetaminophen (TYLENOL) 500 MG tablet Take 1,000 mg by mouth every 6 (six) hours as needed for headache.   . albuterol (PROVENTIL) (2.5 MG/3ML) 0.083% nebulizer solution Take 3 mLs (2.5 mg total) by nebulization every 6 (six) hours as needed for wheezing or shortness of breath.  Marland Kitchen aspirin EC 81 MG tablet Take 81 mg by mouth daily.  . carvedilol (COREG) 6.25 MG tablet Take 3 tablets (18.75 mg total) by mouth 2 (two) times daily.  . cefUROXime (CEFTIN) 250 MG tablet Take 1 tablet (250 mg total) by mouth 2 (two) times daily with a meal.  . digoxin (LANOXIN) 0.125 MG tablet Take 1 tablet by mouth once daily (Patient taking differently: Take 0.125 mg by mouth daily. )  .  diphenhydrAMINE (BENADRYL) 50 MG capsule Take before you leave your home on the day of your procedure (Patient taking differently: Take 50 mg by mouth once. Take before you leave your home on the day of your procedure)  . estradiol (ESTRACE) 0.1 MG/GM vaginal cream Place 1 Applicatorful vaginally at bedtime as needed (Burning and hurting).   . furosemide (LASIX) 80 MG tablet Take 80 mg by mouth daily. Additional 80 mg as needed for extra fluid  . levothyroxine (SYNTHROID) 88 MCG tablet Take 1 tablet (88 mcg total) by mouth daily before breakfast.  . metFORMIN (GLUCOPHAGE) 500 MG tablet TAKE 1 TABLET BY MOUTH TWICE DAILY NEEDS  TO  BE  SEEN  FOR  FURTHER  REFILLS (Patient taking differently: Take 500 mg by mouth in the morning and at bedtime. )  . metroNIDAZOLE (FLAGYL) 500 MG tablet Take 1 tablet (500 mg total) by mouth 3 (three) times daily.  . pantoprazole (PROTONIX) 40 MG tablet TAKE 1 TABLET BY MOUTH ONCE DAILY BEFORE BREAKFAST (Patient taking differently: Take 40 mg by mouth daily. )  . predniSONE (DELTASONE) 50 MG tablet Take 1 tab 13 hours, 7 hours and 1 hour before your procedure. (Patient taking differently: Take 50 mg by mouth See admin instructions. Take 50  mg tab 13 hours, 7 hours and 1 hour before your procedure.)  . sacubitril-valsartan (ENTRESTO) 24-26 MG Take 1 tablet by mouth 2 (two) times daily.  Marland Kitchen spironolactone (ALDACTONE) 25 MG tablet Take 1 tablet (25 mg total) by mouth at bedtime.   No facility-administered encounter medications on file as of 03/25/2019.    Allergies  Allergen Reactions  . Buspar [Buspirone] Other (See Comments)    Patient states that she became hyper and had lots of itching.  Loma Sousa [Escitalopram Oxalate] Other (See Comments)    Patient states that she became hyper and lots of itching  . Bactrim Rash  . Cephalexin Rash    Says she is able to take cefuroxime  . Chlorhexidine Gluconate     Hives, itching after using wipes with last procedure  . Clindamycin Rash       . Contrast Media [Iodinated Diagnostic Agents] Rash  . Doxycycline Rash  . Latex Rash       . Levofloxacin Rash       . Nitrofurantoin Monohyd Macro Rash       . Penicillins Swelling and Rash    Has patient had a PCN reaction causing immediate rash, facial/tongue/throat swelling, SOB or lightheadedness with hypotension: Yes Has patient had a PCN reaction causing severe rash involving mucus membranes or skin necrosis: Yes Has patient had a PCN reaction that required hospitalization: No Has patient had a PCN reaction occurring within the last 10 years: No Rash/swelled tongue If all of the above answers are "NO", then may proceed with Cephalosporin use.     Review of Systems  Constitutional: Negative for activity change, appetite change, chills, diaphoresis, fatigue, fever and unexpected weight change.  HENT: Positive for congestion and rhinorrhea. Negative for dental problem, drooling, ear discharge, ear pain, facial swelling, hearing loss, mouth sores, nosebleeds, postnasal drip, sinus pressure, sinus pain, sneezing, sore throat, tinnitus, trouble swallowing and voice change.   Eyes: Negative.  Negative for photophobia and visual  disturbance.  Respiratory: Positive for cough. Negative for apnea, choking, chest tightness, shortness of breath, wheezing and stridor.   Cardiovascular: Negative for chest pain, palpitations and leg swelling.  Gastrointestinal: Negative for abdominal distention, abdominal pain, blood in stool, constipation,  diarrhea, nausea and vomiting.  Endocrine: Negative.   Genitourinary: Positive for dysuria, frequency, hesitancy and urgency. Negative for decreased urine volume, difficulty urinating, dyspareunia, enuresis, flank pain, genital sores, hematuria, menstrual problem, pelvic pain, vaginal bleeding, vaginal discharge and vaginal pain.  Musculoskeletal: Negative for arthralgias, joint pain, myalgias and neck pain.  Skin: Negative.  Negative for rash.  Allergic/Immunologic: Negative.   Neurological: Negative for dizziness, tremors, seizures, syncope, facial asymmetry, speech difficulty, weakness, light-headedness, numbness and headaches.  Hematological: Negative.   Psychiatric/Behavioral: Negative for confusion, hallucinations, sleep disturbance and suicidal ideas.  All other systems reviewed and are negative.        Observations/Objective: No vital signs or physical exam, this was a telephone or virtual health encounter.  Pt alert and oriented, answers all questions appropriately, and able to speak in full sentences.    Assessment and Plan: Jadaya was seen today for urinary tract infection and uri.  Diagnoses and all orders for this visit:  Dysuria Recently treated for UTI without complete resolution of symptoms. Will obtain specimen for analysis and culture and treat with appropriate therapy. Pt aware to increase water intake and avoid bladder irritants. Pt aware antibiotics will be initiated if warranted once culture has resulted. No red flags concerning for acute pyelonephritis.  -     Urine Culture; Future -     Urinalysis, Complete; Future  URI with cough and congestion Onset of  symptoms yesterday. No fever, loss of taste, or loss of smell. Likely viral in etiology. Symptomatic care discussed in detail. Pt aware to report any new or worsening symptoms. Coricidin HBP over the counter may be beneficial. Report any new, worsening, or persistent symptoms.     Follow Up Instructions: Return if symptoms worsen or fail to improve.    I discussed the assessment and treatment plan with the patient. The patient was provided an opportunity to ask questions and all were answered. The patient agreed with the plan and demonstrated an understanding of the instructions.   The patient was advised to call back or seek an in-person evaluation if the symptoms worsen or if the condition fails to improve as anticipated.  The above assessment and management plan was discussed with the patient. The patient verbalized understanding of and has agreed to the management plan. Patient is aware to call the clinic if they develop any new symptoms or if symptoms persist or worsen. Patient is aware when to return to the clinic for a follow-up visit. Patient educated on when it is appropriate to go to the emergency department.    I provided 15 minutes of non-face-to-face time during this encounter. The call started at 0955. The call ended at 1010. The other time was used for coordination of care.    Monia Pouch, FNP-C [facility] [address] [place], [place] [address] [phone] 03/25/2019

## 2019-03-26 ENCOUNTER — Ambulatory Visit (HOSPITAL_COMMUNITY)
Admission: RE | Admit: 2019-03-26 | Discharge: 2019-03-26 | Disposition: A | Discharge status: Home / Self Care | Payer: Medicare Other | Attending: Cardiology | Admitting: Cardiology

## 2019-03-26 ENCOUNTER — Encounter (HOSPITAL_COMMUNITY)
Admission: RE | Disposition: A | Discharge status: Home / Self Care | Payer: Self-pay | Source: Home / Self Care | Attending: Cardiology

## 2019-03-26 ENCOUNTER — Other Ambulatory Visit: Payer: Self-pay

## 2019-03-26 DIAGNOSIS — Z79890 Hormone replacement therapy (postmenopausal): Secondary | ICD-10-CM | POA: Diagnosis not present

## 2019-03-26 DIAGNOSIS — Z87891 Personal history of nicotine dependence: Secondary | ICD-10-CM | POA: Diagnosis not present

## 2019-03-26 DIAGNOSIS — I493 Ventricular premature depolarization: Secondary | ICD-10-CM | POA: Diagnosis not present

## 2019-03-26 DIAGNOSIS — Z79899 Other long term (current) drug therapy: Secondary | ICD-10-CM | POA: Diagnosis not present

## 2019-03-26 DIAGNOSIS — E785 Hyperlipidemia, unspecified: Secondary | ICD-10-CM | POA: Diagnosis not present

## 2019-03-26 DIAGNOSIS — Z7982 Long term (current) use of aspirin: Secondary | ICD-10-CM | POA: Insufficient documentation

## 2019-03-26 DIAGNOSIS — E039 Hypothyroidism, unspecified: Secondary | ICD-10-CM | POA: Insufficient documentation

## 2019-03-26 DIAGNOSIS — I429 Cardiomyopathy, unspecified: Secondary | ICD-10-CM | POA: Diagnosis not present

## 2019-03-26 DIAGNOSIS — R0609 Other forms of dyspnea: Secondary | ICD-10-CM | POA: Insufficient documentation

## 2019-03-26 DIAGNOSIS — I428 Other cardiomyopathies: Secondary | ICD-10-CM | POA: Insufficient documentation

## 2019-03-26 DIAGNOSIS — I480 Paroxysmal atrial fibrillation: Secondary | ICD-10-CM | POA: Diagnosis not present

## 2019-03-26 DIAGNOSIS — Z7984 Long term (current) use of oral hypoglycemic drugs: Secondary | ICD-10-CM | POA: Insufficient documentation

## 2019-03-26 DIAGNOSIS — Z8249 Family history of ischemic heart disease and other diseases of the circulatory system: Secondary | ICD-10-CM | POA: Diagnosis not present

## 2019-03-26 DIAGNOSIS — I5022 Chronic systolic (congestive) heart failure: Secondary | ICD-10-CM | POA: Diagnosis not present

## 2019-03-26 DIAGNOSIS — I509 Heart failure, unspecified: Secondary | ICD-10-CM | POA: Diagnosis not present

## 2019-03-26 HISTORY — PX: RIGHT/LEFT HEART CATH AND CORONARY ANGIOGRAPHY: CATH118266

## 2019-03-26 LAB — POCT I-STAT EG7
Acid-Base Excess: 5 mmol/L — ABNORMAL HIGH (ref 0.0–2.0)
Acid-Base Excess: 5 mmol/L — ABNORMAL HIGH (ref 0.0–2.0)
Bicarbonate: 29.3 mmol/L — ABNORMAL HIGH (ref 20.0–28.0)
Bicarbonate: 29.6 mmol/L — ABNORMAL HIGH (ref 20.0–28.0)
Calcium, Ion: 1.09 mmol/L — ABNORMAL LOW (ref 1.15–1.40)
Calcium, Ion: 1.12 mmol/L — ABNORMAL LOW (ref 1.15–1.40)
HCT: 36 % (ref 36.0–46.0)
HCT: 36 % (ref 36.0–46.0)
Hemoglobin: 12.2 g/dL (ref 12.0–15.0)
Hemoglobin: 12.2 g/dL (ref 12.0–15.0)
O2 Saturation: 77 %
O2 Saturation: 79 %
Potassium: 3.3 mmol/L — ABNORMAL LOW (ref 3.5–5.1)
Potassium: 3.3 mmol/L — ABNORMAL LOW (ref 3.5–5.1)
Sodium: 137 mmol/L (ref 135–145)
Sodium: 138 mmol/L (ref 135–145)
TCO2: 31 mmol/L (ref 22–32)
TCO2: 31 mmol/L (ref 22–32)
pCO2, Ven: 42.7 mmHg — ABNORMAL LOW (ref 44.0–60.0)
pCO2, Ven: 43 mmHg — ABNORMAL LOW (ref 44.0–60.0)
pH, Ven: 7.445 — ABNORMAL HIGH (ref 7.250–7.430)
pH, Ven: 7.446 — ABNORMAL HIGH (ref 7.250–7.430)
pO2, Ven: 40 mmHg (ref 32.0–45.0)
pO2, Ven: 42 mmHg (ref 32.0–45.0)

## 2019-03-26 LAB — BASIC METABOLIC PANEL
Anion gap: 14 (ref 5–15)
BUN: 9 mg/dL (ref 8–23)
CO2: 27 mmol/L (ref 22–32)
Calcium: 9.1 mg/dL (ref 8.9–10.3)
Chloride: 94 mmol/L — ABNORMAL LOW (ref 98–111)
Creatinine, Ser: 1.17 mg/dL — ABNORMAL HIGH (ref 0.44–1.00)
GFR calc Af Amer: 53 mL/min — ABNORMAL LOW (ref >60)
GFR calc non Af Amer: 46 mL/min — ABNORMAL LOW (ref >60)
Glucose, Bld: 293 mg/dL — ABNORMAL HIGH (ref 70–99)
Potassium: 4.3 mmol/L (ref 3.5–5.1)
Sodium: 135 mmol/L (ref 135–145)

## 2019-03-26 LAB — GLUCOSE, CAPILLARY
Glucose-Capillary: 295 mg/dL — ABNORMAL HIGH (ref 70–99)
Glucose-Capillary: 295 mg/dL — ABNORMAL HIGH (ref 70–99)

## 2019-03-26 SURGERY — RIGHT/LEFT HEART CATH AND CORONARY ANGIOGRAPHY
Anesthesia: LOCAL

## 2019-03-26 MED ORDER — INSULIN ASPART 100 UNIT/ML ~~LOC~~ SOLN
3.0000 [IU] | Freq: Once | SUBCUTANEOUS | Status: AC
  Administered 2019-03-26: 3 [IU] via SUBCUTANEOUS
  Filled 2019-03-26: qty 1

## 2019-03-26 MED ORDER — HEPARIN SODIUM (PORCINE) 1000 UNIT/ML IJ SOLN
INTRAMUSCULAR | Status: DC | PRN
  Administered 2019-03-26: 4500 [IU] via INTRAVENOUS

## 2019-03-26 MED ORDER — FENTANYL CITRATE (PF) 100 MCG/2ML IJ SOLN
INTRAMUSCULAR | Status: AC
  Filled 2019-03-26: qty 2

## 2019-03-26 MED ORDER — FENTANYL CITRATE (PF) 100 MCG/2ML IJ SOLN
INTRAMUSCULAR | Status: DC | PRN
  Administered 2019-03-26 (×2): 25 ug via INTRAVENOUS

## 2019-03-26 MED ORDER — ACETAMINOPHEN 325 MG PO TABS: 650.0000 mg | ORAL_TABLET | ORAL | Status: DC | PRN

## 2019-03-26 MED ORDER — SODIUM CHLORIDE 0.9% FLUSH: 3.0000 mL | INTRAVENOUS | Status: DC | PRN

## 2019-03-26 MED ORDER — VERAPAMIL HCL 2.5 MG/ML IV SOLN
INTRAVENOUS | Status: AC
  Filled 2019-03-26: qty 2

## 2019-03-26 MED ORDER — SODIUM CHLORIDE 0.9 % IV SOLN: 250.0000 mL | INTRAVENOUS | Status: DC | PRN

## 2019-03-26 MED ORDER — SODIUM CHLORIDE 0.9 % IV SOLN: INTRAVENOUS | Status: DC

## 2019-03-26 MED ORDER — LABETALOL HCL 5 MG/ML IV SOLN: 10.0000 mg | INTRAVENOUS | Status: DC | PRN

## 2019-03-26 MED ORDER — ONDANSETRON HCL 4 MG/2ML IJ SOLN: 4.0000 mg | Freq: Four times a day (QID) | INTRAMUSCULAR | Status: DC | PRN

## 2019-03-26 MED ORDER — HEPARIN (PORCINE) IN NACL 1000-0.9 UT/500ML-% IV SOLN
INTRAVENOUS | Status: DC | PRN
  Administered 2019-03-26: 500 mL

## 2019-03-26 MED ORDER — METHYLPREDNISOLONE SODIUM SUCC 125 MG IJ SOLR: 125.0000 mg | Freq: Once | INTRAMUSCULAR | Status: DC

## 2019-03-26 MED ORDER — LIDOCAINE HCL (PF) 1 % IJ SOLN
INTRAMUSCULAR | Status: AC
  Filled 2019-03-26: qty 30

## 2019-03-26 MED ORDER — LIDOCAINE HCL (PF) 1 % IJ SOLN
INTRAMUSCULAR | Status: DC | PRN
  Administered 2019-03-26: 1 mL
  Administered 2019-03-26: 2 mL

## 2019-03-26 MED ORDER — SODIUM CHLORIDE 0.9% FLUSH: 3.0000 mL | Freq: Two times a day (BID) | INTRAVENOUS | Status: DC

## 2019-03-26 MED ORDER — DIPHENHYDRAMINE HCL 50 MG/ML IJ SOLN: 25.0000 mg | Freq: Once | INTRAMUSCULAR | Status: DC

## 2019-03-26 MED ORDER — HEPARIN SODIUM (PORCINE) 1000 UNIT/ML IJ SOLN
INTRAMUSCULAR | Status: AC
  Filled 2019-03-26: qty 1

## 2019-03-26 MED ORDER — MIDAZOLAM HCL 2 MG/2ML IJ SOLN
INTRAMUSCULAR | Status: AC
  Filled 2019-03-26: qty 2

## 2019-03-26 MED ORDER — HEPARIN (PORCINE) IN NACL 1000-0.9 UT/500ML-% IV SOLN
INTRAVENOUS | Status: AC
  Filled 2019-03-26: qty 1000

## 2019-03-26 MED ORDER — MIDAZOLAM HCL 2 MG/2ML IJ SOLN
INTRAMUSCULAR | Status: DC | PRN
  Administered 2019-03-26 (×2): 1 mg via INTRAVENOUS

## 2019-03-26 MED ORDER — IOHEXOL 350 MG/ML SOLN
INTRAVENOUS | Status: DC | PRN
  Administered 2019-03-26: 38 mL

## 2019-03-26 MED ORDER — VERAPAMIL HCL 2.5 MG/ML IV SOLN
INTRAVENOUS | Status: DC | PRN
  Administered 2019-03-26: 10 mL via INTRA_ARTERIAL

## 2019-03-26 MED ORDER — HYDRALAZINE HCL 20 MG/ML IJ SOLN: 10.0000 mg | INTRAMUSCULAR | Status: DC | PRN

## 2019-03-26 SURGICAL SUPPLY — 10 items
CATH 5FR JL3.5 JR4 ANG PIG MP (CATHETERS) ×2 IMPLANT
CATH BALLN WEDGE 5F 110CM (CATHETERS) ×2 IMPLANT
DEVICE RAD COMP TR BAND LRG (VASCULAR PRODUCTS) ×2 IMPLANT
GLIDESHEATH SLEND SS 6F .021 (SHEATH) ×2 IMPLANT
GUIDEWIRE INQWIRE 1.5J.035X260 (WIRE) ×1 IMPLANT
INQWIRE 1.5J .035X260CM (WIRE) ×2
KIT HEART LEFT (KITS) ×2 IMPLANT
PACK CARDIAC CATHETERIZATION (CUSTOM PROCEDURE TRAY) ×2 IMPLANT
SHEATH GLIDE SLENDER 4/5FR (SHEATH) ×2 IMPLANT
TRANSDUCER W/STOPCOCK (MISCELLANEOUS) ×2 IMPLANT

## 2019-03-26 NOTE — Progress Notes (Signed)
Pt took pre meds at home prednisone at 2300, 0510, 0900 And benadryl at 0900

## 2019-03-26 NOTE — Discharge Instructions (Signed)
Keep right wrist elevated at heart level for 24 hours  Hold Metformin for 48 hours post procedure. May resume 3/7 pm  Radial Site Care  This sheet gives you information about how to care for yourself after your procedure. Your health care provider may also give you more specific instructions. If you have problems or questions, contact your health care provider. What can I expect after the procedure? After the procedure, it is common to have:  Bruising and tenderness at the catheter insertion area. Follow these instructions at home: Medicines  Take over-the-counter and prescription medicines only as told by your health care provider. Insertion site care  Follow instructions from your health care provider about how to take care of your insertion site. Make sure you: ? Wash your hands with soap and water before you change your bandage (dressing). If soap and water are not available, use hand sanitizer. ? Remove your dressing as told by your health care provider. In 24 hours  Check your insertion site every day for signs of infection. Check for: ? Redness, swelling, or pain. ? Fluid or blood. ? Pus or a bad smell. ? Warmth.  Do not take baths, swim, or use a hot tub until your health care provider approves.  You may shower 24-48 hours after the procedure, or as directed by your health care provider. ? Remove the dressing and gently wash the site with plain soap and water. ? Pat the area dry with a clean towel. ? Do not rub the site. That could cause bleeding.  Do not apply powder or lotion to the site. Activity   For 24 hours after the procedure, or as directed by your health care provider: ? Do not flex or bend the affected arm. ? Do not push or pull heavy objects with the affected arm. ? Do not drive yourself home from the hospital or clinic. You may drive 24 hours after the procedure unless your health care provider tells you not to. ? Do not operate machinery or power  tools.  Do not lift anything that is heavier than 10 lb (4.5 kg), or the limit that you are told, until your health care provider says that it is safe. For 4 days  Ask your health care provider when it is okay to: ? Return to work or school. ? Resume usual physical activities or sports. ? Resume sexual activity. General instructions  If the catheter site starts to bleed, raise your arm and put firm pressure on the site. If the bleeding does not stop, get help right away. This is a medical emergency.  If you went home on the same day as your procedure, a responsible adult should be with you for the first 24 hours after you arrive home.  Keep all follow-up visits as told by your health care provider. This is important. Contact a health care provider if:  You have a fever.  You have redness, swelling, or yellow drainage around your insertion site. Get help right away if:  You have unusual pain at the radial site.  The catheter insertion area swells very fast.  The insertion area is bleeding, and the bleeding does not stop when you hold steady pressure on the area.  Your arm or hand becomes pale, cool, tingly, or numb. These symptoms may represent a serious problem that is an emergency. Do not wait to see if the symptoms will go away. Get medical help right away. Call your local emergency services (911 in the  [place] Do not drive yourself to the hospital. Summary  After the procedure, it is common to have bruising and tenderness at the site.  Follow instructions from your health care provider about how to take care of your radial site wound. Check the wound every day for signs of infection.  Do not lift anything that is heavier than 10 lb (4.5 kg), or the limit that you are told, until your health care provider says that it is safe. This information is not intended to replace advice given to you by your health care provider. Make sure you discuss any questions you have with your  health care provider. Document Revised: 02/12/2017 Document Reviewed: 02/12/2017 Elsevier Patient Education  2020 [employer].

## 2019-03-26 NOTE — Interval H&P Note (Signed)
History and Physical Interval Note:  03/26/2019 12:41 PM  Melissa Paul  has presented today for surgery, with the diagnosis of heart faiure.  The various methods of treatment have been discussed with the patient and family. After consideration of risks, benefits and other options for treatment, the patient has consented to  Procedure(s): RIGHT/LEFT HEART CATH AND CORONARY ANGIOGRAPHY (N/A) as a surgical intervention.  The patient's history has been reviewed, patient examined, no change in status, stable for surgery.  I have reviewed the patient's chart and labs.  Questions were answered to the patient's satisfaction.     Dashiel Bergquist [employer]

## 2019-03-28 LAB — URINE CULTURE

## 2019-03-29 ENCOUNTER — Telehealth: Payer: Self-pay | Admitting: Family

## 2019-03-29 NOTE — Telephone Encounter (Signed)
RESULTS GIVEN, PATIENT STILL SYMPTOMATIC, APPOINTMENT SCHEDULED

## 2019-03-31 ENCOUNTER — Telehealth: Payer: Medicare Other | Admitting: Internal Medicine

## 2019-03-31 ENCOUNTER — Other Ambulatory Visit: Payer: Self-pay | Admitting: Family

## 2019-04-01 ENCOUNTER — Ambulatory Visit: Payer: Medicare Other | Admitting: Family

## 2019-04-02 ENCOUNTER — Telehealth (HOSPITAL_COMMUNITY): Payer: Self-pay | Admitting: Pharmacist

## 2019-04-02 ENCOUNTER — Other Ambulatory Visit: Payer: Self-pay | Admitting: Family

## 2019-04-02 NOTE — Telephone Encounter (Signed)
Left message that patient needs an appointment to get refills.

## 2019-04-02 NOTE — Telephone Encounter (Signed)
Called [employer] to check on status of Entresto patient assistance application. Representative confirmed application had been received and determination was pending.   Audry Riles, PharmD, BCPS, BCCP, CPP Heart Failure Clinic Pharmacist [phone]

## 2019-04-02 NOTE — Telephone Encounter (Signed)
[place]. NTBS 30 days given 02/25/19

## 2019-04-06 ENCOUNTER — Ambulatory Visit (HOSPITAL_COMMUNITY)
Admission: RE | Admit: 2019-04-06 | Discharge: 2019-04-06 | Disposition: A | Discharge status: Home / Self Care | Payer: Medicare Other | Source: Ambulatory Visit | Attending: Cardiology | Admitting: Cardiology

## 2019-04-06 ENCOUNTER — Encounter (HOSPITAL_COMMUNITY): Payer: Self-pay | Admitting: Cardiology

## 2019-04-06 ENCOUNTER — Other Ambulatory Visit: Payer: Self-pay

## 2019-04-06 VITALS — BP 108/64 | HR 68 | Wt 189.4 lb

## 2019-04-06 DIAGNOSIS — E785 Hyperlipidemia, unspecified: Secondary | ICD-10-CM | POA: Diagnosis not present

## 2019-04-06 DIAGNOSIS — Z87891 Personal history of nicotine dependence: Secondary | ICD-10-CM | POA: Diagnosis not present

## 2019-04-06 DIAGNOSIS — R0602 Shortness of breath: Secondary | ICD-10-CM

## 2019-04-06 DIAGNOSIS — I480 Paroxysmal atrial fibrillation: Secondary | ICD-10-CM | POA: Diagnosis not present

## 2019-04-06 DIAGNOSIS — I5022 Chronic systolic (congestive) heart failure: Secondary | ICD-10-CM

## 2019-04-06 DIAGNOSIS — E039 Hypothyroidism, unspecified: Secondary | ICD-10-CM | POA: Insufficient documentation

## 2019-04-06 DIAGNOSIS — R55 Syncope and collapse: Secondary | ICD-10-CM | POA: Diagnosis not present

## 2019-04-06 DIAGNOSIS — Z7984 Long term (current) use of oral hypoglycemic drugs: Secondary | ICD-10-CM | POA: Diagnosis not present

## 2019-04-06 DIAGNOSIS — Z79899 Other long term (current) drug therapy: Secondary | ICD-10-CM | POA: Insufficient documentation

## 2019-04-06 DIAGNOSIS — I428 Other cardiomyopathies: Secondary | ICD-10-CM | POA: Diagnosis not present

## 2019-04-06 DIAGNOSIS — Z7901 Long term (current) use of anticoagulants: Secondary | ICD-10-CM | POA: Insufficient documentation

## 2019-04-06 LAB — CBC
HCT: 38.9 % (ref 36.0–46.0)
Hemoglobin: 13.1 g/dL (ref 12.0–15.0)
MCH: 29.8 pg (ref 26.0–34.0)
MCHC: 33.7 g/dL (ref 30.0–36.0)
MCV: 88.4 fL (ref 80.0–100.0)
Platelets: 295 10*3/uL (ref 150–400)
RBC: 4.4 MIL/uL (ref 3.87–5.11)
RDW: 13.6 % (ref 11.5–15.5)
WBC: 11.5 10*3/uL — ABNORMAL HIGH (ref 4.0–10.5)
nRBC: 0 % (ref 0.0–0.2)

## 2019-04-06 LAB — BASIC METABOLIC PANEL
Anion gap: 11 (ref 5–15)
BUN: 11 mg/dL (ref 8–23)
CO2: 29 mmol/L (ref 22–32)
Calcium: 9 mg/dL (ref 8.9–10.3)
Chloride: 94 mmol/L — ABNORMAL LOW (ref 98–111)
Creatinine, Ser: 1.01 mg/dL — ABNORMAL HIGH (ref 0.44–1.00)
GFR calc Af Amer: >60 mL/min (ref >60)
GFR calc non Af Amer: 54 mL/min — ABNORMAL LOW (ref >60)
Glucose, Bld: 334 mg/dL — ABNORMAL HIGH (ref 70–99)
Potassium: 3.9 mmol/L (ref 3.5–5.1)
Sodium: 134 mmol/L — ABNORMAL LOW (ref 135–145)

## 2019-04-06 LAB — DIGOXIN LEVEL: Digoxin Level: 1.3 ng/mL (ref 0.8–2.0)

## 2019-04-06 MED ORDER — APIXABAN 5 MG PO TABS: 5.0000 mg | ORAL_TABLET | Freq: Two times a day (BID) | ORAL | 5 refills | Status: DC

## 2019-04-06 NOTE — Patient Instructions (Addendum)
STOP Aspirin   START Eliquis 5mg  (1 tab) twice a day   Your Entresto application is pending with [employer]   Your physician has recommended that you have a pulmonary function test. Pulmonary Function Tests are a group of tests that measure how well air moves in and out of your lungs.   You have been referred to Pulmonary at [facility].  They will call you to schedule an appointment.    You have been referred to Cardiac Rehab at [facility], you will get a call to schedule an appointment   Labs today We will only contact you if something comes back abnormal or we need to make some changes. Otherwise no news is good news!

## 2019-04-07 ENCOUNTER — Telehealth (HOSPITAL_COMMUNITY): Payer: Self-pay

## 2019-04-07 DIAGNOSIS — I5022 Chronic systolic (congestive) heart failure: Secondary | ICD-10-CM

## 2019-04-07 MED ORDER — DIGOXIN 62.5 MCG PO TABS: 0.0625 mg | ORAL_TABLET | Freq: Every day | ORAL | 5 refills | Status: DC

## 2019-04-07 NOTE — Progress Notes (Signed)
PCP: Sharion Balloon, FNP Cardiology: Dr. Domenic Polite EP: Dr. [name] HF Cardiology: Dr. Aundra Dubin  74 y.o. with history of CHF/nonischemic cardiomyopathy, paroxysmal atrial fibrillation, and VT referred by Dr. [name] for CHF evaluation. Cardiomyopathy was diagnosed years ago, she had an echo in 8/13 with EF 35-40%.  Due to LBBB, she had St Jude BiV ICD placed.  Cath in 2016 showed no significant coronary disease. Most recent echo in 8/20 showed a severely dilated LV with EF 25-30%.    In 1/21, she had an episode of polymorphic VT with syncope, ICD discharged appropriately. This was in the setting of GI illness with low K, amiodarone was not started.   She has had a clinical decline over the last year.  She has struggled recently with dyspnea.  Using rolling walker, has to stop several times walking into the office.  She is short of breath walking around her house.  She does report chest tightness along with the dyspnea when she walks.  Weight has trended up but only mildly.   I set her up for LHC/RHC in 3/21.  This showed no significant CAD and low filling pressures along with preserved cardiac output.   She snores and has daytime sleepiness, but has not wanted sleep study and says she would not wear CPAP. She has not smoked for 20+ years. She has a history of paroxysmal atrial fibrillation (not recently) but has refused anticoagulation.   She returns for followup of CHF and dyspnea.  Her symptoms are unchanged, still short of breath walking around the house, still with tightness in her chest with exertion.    St Jude device interrogation: 94% BiV pacing, 3.3% atrial fibrillation, thoracic impedance stable.   Labs (2/21): K 3.9, creatinine 1.11 Labs (3/21): K 4.3, creatinine 1.17  PMH: 1. Chronic systolic CHF: Nonischemic cardiomyopathy.  St Jude BiV ICD.  - Echo (8/13): EF 35-40%.  - LHC/RHC (3/16): No significant CAD.  Mean RA 4, PA 22/8, mean PCWP 11, CI 3.4.  - Echo (8/20): EF 25-30%, severe  LV dilation, normal RV.  - LHC/RHC (3/21): No significant CAD.  Mean RA 1, PA 20/5, mean PCWP 9, CI 3.7.  2. 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  3. Hyperlipidemia 4. Hypothyroidism 5. Atrial fibrillation: Paroxysmal.  She has refused anticoagulation.  6. PVCs: 8.2% noted on last device check, did not tolerate increase in ventricular rate to try to suppress.  Social History   Socioeconomic History  . Marital status: Widowed    Spouse name: Not on file  . Number of children: Not on file  . Years of education: Not on file  . Highest education level: Not on file  Occupational History  . Not on file  Tobacco Use  . Smoking status: Former Smoker    Packs/day: 0.30    Years: 20.00    Pack years: 6.00    Types: Cigarettes    Start date: 08/03/1949    Quit date: 01/22/1996    Years since quitting: 23.2  . Smokeless tobacco: Never Used  Substance and Sexual Activity  . Alcohol use: No    Alcohol/week: 0.0 standard drinks  . Drug use: No  . Sexual activity: Not on file  Other Topics Concern  . Not on file  Social History Narrative  . Not on file   Social Determinants of Health   Financial Resource Strain:   . Difficulty of Paying Living Expenses:   Food Insecurity:   . Worried About Charity fundraiser in  the Last Year:   . [place] in the Last Year:   Transportation Needs:   . Film/video editor (Medical):   Marland Kitchen Lack of Transportation (Non-Medical):   Physical Activity:   . Days of Exercise per Week:   . Minutes of Exercise per Session:   Stress:   . Feeling of Stress :   Social Connections:   . Frequency of Communication with Friends and Family:   . Frequency of Social Gatherings with Friends and Family:   . Attends Religious Services:   . Active Member of Clubs or Organizations:   . Attends Archivist Meetings:   Marland Kitchen Marital Status:   Intimate Partner Violence:   . Fear of Current or Ex-Partner:   . Emotionally Abused:   Marland Kitchen Physically Abused:   .  Sexually Abused:    Family History  Problem Relation Age of Onset  . CVA Mother        multiple  . Heart Problems Mother        "weak heart"  . Heart disease Maternal Grandmother   . Heart disease Maternal Grandfather   . Cancer Father        Stomach and eshpogus  . Diabetes Neg Hx   . Hypertension Neg Hx   . Coronary artery disease Neg Hx    ROS: All systems reviewed and negative except as per HPI.   Current Outpatient Medications  Medication Sig Dispense Refill  . acetaminophen (TYLENOL) 500 MG tablet Take 1,000 mg by mouth every 6 (six) hours as needed for headache.     . albuterol (PROVENTIL) (2.5 MG/3ML) 0.083% nebulizer solution Take 3 mLs (2.5 mg total) by nebulization every 6 (six) hours as needed for wheezing or shortness of breath. 150 mL 1  . carvedilol (COREG) 6.25 MG tablet Take 3 tablets (18.75 mg total) by mouth 2 (two) times daily. 180 tablet 10  . cefUROXime (CEFTIN) 250 MG tablet Take 1 tablet (250 mg total) by mouth 2 (two) times daily with a meal. 14 tablet 0  . diphenhydrAMINE (BENADRYL) 50 MG capsule Take before you leave your home on the day of your procedure (Patient taking differently: Take 50 mg by mouth once. Take before you leave your home on the day of your procedure) 1 capsule 0  . estradiol (ESTRACE) 0.1 MG/GM vaginal cream Place 1 Applicatorful vaginally at bedtime as needed (Burning and hurting).     . furosemide (LASIX) 80 MG tablet Take 80 mg by mouth daily. Additional 80 mg as needed for extra fluid    . levothyroxine (SYNTHROID) 88 MCG tablet Take 1 tablet (88 mcg total) by mouth daily before breakfast. 90 tablet 1  . metFORMIN (GLUCOPHAGE) 500 MG tablet Take 500 mg by mouth 2 (two) times daily with a meal.    . pantoprazole (PROTONIX) 40 MG tablet Take 40 mg by mouth daily.    . predniSONE (DELTASONE) 50 MG tablet Take 1 tab 13 hours, 7 hours and 1 hour before your procedure. (Patient taking differently: Take 50 mg by mouth See admin instructions.  Take 50 mg tab 13 hours, 7 hours and 1 hour before your procedure.) 3 tablet 0  . sacubitril-valsartan (ENTRESTO) 24-26 MG Take 1 tablet by mouth 2 (two) times daily. 60 tablet 3  . spironolactone (ALDACTONE) 25 MG tablet Take 1 tablet (25 mg total) by mouth at bedtime. 90 tablet 3  . apixaban (ELIQUIS) 5 MG TABS tablet Take 1 tablet (5 mg total) by  mouth 2 (two) times daily. 60 tablet 5  . digoxin 62.5 MCG TABS Take 0.0625 mg by mouth daily. 30 tablet 5   No current facility-administered medications for this encounter.   BP 108/64   Pulse 68   Wt 85.9 kg (189 lb 6.4 oz)   SpO2 97%   BMI 34.09 kg/m  General: NAD Neck: No JVD, no thyromegaly or thyroid nodule.  Lungs: Clear to auscultation bilaterally with normal respiratory effort. CV: Nondisplaced PMI.  Heart regular S1/S2, no S3/S4, no murmur.  No peripheral edema.  No carotid bruit.  Normal pedal pulses.  Abdomen: Soft, nontender, no hepatosplenomegaly, no distention.  Skin: Intact without lesions or rashes.  Neurologic: Alert and oriented x 3.  Psych: Normal affect. Extremities: No clubbing or cyanosis.  HEENT: Normal.   Assessment/Plan: 1. Chronic systolic CHF: Nonischemic cardiomyopathy by cath in 2016 and again in 3/21.  Low EF back to at least 2013.  Most recent echo in 8/20 with EF 25-30%, severe LV dilation.  RHC in 3/21 showed preserved cardiac output and low filling pressures.  St Jude BiV ICD. She has a higher percentage of BiV pacing today, up to 94%.  Suspect less PVCs.  She is still significantly symptomatic, NYHA class III.  However, she is not volume overloaded by exam or by Corvue, and RHC shows normal filling pressures. I cannot explain her exertional symptoms by cardiac findings.  - I will continue digoxin, check level today.  - Continue Coreg 18.75 mg bid.  - Continue current Lasix 80 mg daily, BMET today.  - Continue Entresto 24/26 bid.  Would not increase today with SBP around 100. - Continue spironolactone 25  mg daily.  - With unremarkable cath, would like her to have pulmonary workup for dyspnea.  I will arrange for PFTs and an appointment with [facility] in [place].  - I will arrange for cardiac rehab at [facility].  2. VT/PVCs: Recent polymorphic VT in setting of low K/GI illness.  Syncope with appropriate ICD discharge. She was not started on an anti-arrhythmic. Higher BiV pacing percentage today suggests less PVCs.  - Continue Coreg.   3. Suspect OSA: She would not consider CPAP.  4. Atrial fibrillation: Paroxysmal.  3.3% atrial fibrillation on device check.   - She agrees to stop ASA and start Eliquis 5 mg bid.   Loralie Champagne 04/07/2019

## 2019-04-07 NOTE — Telephone Encounter (Signed)
Called patient, her daughter picked up. Lab results discussed.  Appt made for repeat labs. Verbalized understanding.

## 2019-04-07 NOTE — Addendum Note (Signed)
Addended by: Valeda Malm on: 04/07/2019 10:36 AM   Modules accepted: Orders

## 2019-04-07 NOTE — Telephone Encounter (Signed)
-----   Message from [name], MD sent at 04/06/2019  4:18 PM EDT ----- Decrease digoxin to 0.0625 mg daily, recheck level in 10-14 days.

## 2019-04-09 ENCOUNTER — Other Ambulatory Visit: Payer: Self-pay | Admitting: Family

## 2019-04-09 MED ORDER — METFORMIN HCL 500 MG PO TABS: 500.0000 mg | ORAL_TABLET | Freq: Two times a day (BID) | ORAL | 2 refills | Status: DC

## 2019-04-09 NOTE — Telephone Encounter (Signed)
Busy signal

## 2019-04-09 NOTE — Addendum Note (Signed)
Addended by: Evelina Dun A on: 04/09/2019 04:50 PM   Modules accepted: Orders

## 2019-04-09 NOTE — Telephone Encounter (Signed)
  Medication Request  04/09/2019  What is the name of the medication? Metformin 500 mg Patient has appt 3-22 with Alyse Low and wants some called to get her through til Monday  Have you contacted your pharmacy to request a refill? Yes   Which pharmacy would you like this sent to? [facility] in [place]   Patient notified that their request is being sent to the clinical staff for review and that they should receive a call once it is complete. If they do not receive a call within 24 hours they can check with their pharmacy or our office.

## 2019-04-09 NOTE — Telephone Encounter (Signed)
Prescription sent to pharmacy.

## 2019-04-09 NOTE — Telephone Encounter (Signed)
Last A1C,  September 2020.  Does not have provider listed on metformin script in chart. Please advise.

## 2019-04-09 NOTE — Telephone Encounter (Signed)
Can we call and verify that she is taking Metformin? It is in her history, but never prescribed by me. Her last A1C was very controlled.

## 2019-04-09 NOTE — Telephone Encounter (Signed)
Patient states that she has been taking Metformin 500mg  BID but is currently out and will need enough to get her to her next appointment

## 2019-04-12 ENCOUNTER — Encounter: Payer: Self-pay | Admitting: Family

## 2019-04-12 ENCOUNTER — Other Ambulatory Visit: Payer: Self-pay

## 2019-04-12 ENCOUNTER — Ambulatory Visit (INDEPENDENT_AMBULATORY_CARE_PROVIDER_SITE_OTHER): Payer: Medicare Other | Admitting: Family

## 2019-04-12 VITALS — BP 117/64 | HR 70 | Temp 97.8°F | Ht 62.5 in | Wt 186.2 lb

## 2019-04-12 DIAGNOSIS — I5023 Acute on chronic systolic (congestive) heart failure: Secondary | ICD-10-CM | POA: Diagnosis not present

## 2019-04-12 DIAGNOSIS — E039 Hypothyroidism, unspecified: Secondary | ICD-10-CM

## 2019-04-12 DIAGNOSIS — F411 Generalized anxiety disorder: Secondary | ICD-10-CM

## 2019-04-12 DIAGNOSIS — B373 Candidiasis of vulva and vagina: Secondary | ICD-10-CM

## 2019-04-12 DIAGNOSIS — I080 Rheumatic disorders of both mitral and aortic valves: Secondary | ICD-10-CM

## 2019-04-12 DIAGNOSIS — I480 Paroxysmal atrial fibrillation: Secondary | ICD-10-CM | POA: Diagnosis not present

## 2019-04-12 DIAGNOSIS — I428 Other cardiomyopathies: Secondary | ICD-10-CM

## 2019-04-12 DIAGNOSIS — K5900 Constipation, unspecified: Secondary | ICD-10-CM

## 2019-04-12 DIAGNOSIS — E1169 Type 2 diabetes mellitus with other specified complication: Secondary | ICD-10-CM | POA: Diagnosis not present

## 2019-04-12 DIAGNOSIS — I5022 Chronic systolic (congestive) heart failure: Secondary | ICD-10-CM

## 2019-04-12 DIAGNOSIS — B3731 Acute candidiasis of vulva and vagina: Secondary | ICD-10-CM

## 2019-04-12 LAB — BAYER DCA HB A1C WAIVED: HB A1C (BAYER DCA - WAIVED): 8.8 % — ABNORMAL HIGH (ref <7.0)

## 2019-04-12 MED ORDER — FLUCONAZOLE 150 MG PO TABS: 150.0000 mg | ORAL_TABLET | ORAL | 0 refills | Status: DC | PRN

## 2019-04-12 MED ORDER — METFORMIN HCL 500 MG PO TABS: 500.0000 mg | ORAL_TABLET | Freq: Two times a day (BID) | ORAL | 2 refills | Status: DC

## 2019-04-12 NOTE — Patient Instructions (Signed)

## 2019-04-12 NOTE — Telephone Encounter (Signed)
Advanced Heart Failure Patient Advocate Encounter   Patient was approved to receive Entresto from [employer]. Left VM.  Patient ID: [id number] Effective dates: 04/12/19 through 01/21/20  Audry Riles, PharmD, BCPS, BCCP, CPP Heart Failure Clinic Pharmacist [phone]

## 2019-04-12 NOTE — Progress Notes (Signed)
Subjective:    Patient ID: Melissa Paul, female    DOB: 1945/07/25, 74 y.o.   MRN: [id number]  Chief Complaint  Patient presents with  . Medical Management of Chronic Issues    dizzy,HA, blurray vision    Pt presents to the office today for chronic follow up. She is followed by Cardiologists every 3 months for A Fib, CHF, mitral regurgitation, and has  Pacemaker in place.   She has an appointment to establish care with a pulmonologist this week for her SOB.  Congestive Heart Failure Presents for follow-up visit. Associated symptoms include fatigue and shortness of breath. The symptoms have been stable.  Thyroid Problem Presents for follow-up visit. Symptoms include anxiety, constipation, depressed mood, fatigue and visual change. Patient reports no diarrhea or dry skin. The symptoms have been stable.  Diabetes She presents for her follow-up diabetic visit. She has type 2 diabetes mellitus. Her disease course has been stable. Hypoglycemia symptoms include nervousness/anxiousness. Associated symptoms include blurred vision, fatigue and visual change. Symptoms are worsening. Pertinent negatives for diabetic complications include no CVA, heart disease or nephropathy. Risk factors for coronary artery disease include dyslipidemia, diabetes mellitus, hypertension, sedentary lifestyle and post-menopausal. She is following a generally unhealthy diet. Her overall blood glucose range is 180-200 mg/dl. An ACE inhibitor/angiotensin II receptor blocker is being taken.  Anxiety Presents for follow-up visit. Symptoms include depressed mood, excessive worry, irritability, nervous/anxious behavior, restlessness and shortness of breath. Symptoms occur occasionally. The severity of symptoms is moderate. The quality of sleep is good.    Vaginal Itching The patient's primary symptoms include genital itching. This is a new problem. The current episode started 1 to 4 weeks ago. The problem occurs  intermittently. The problem has been waxing and waning. Associated symptoms include constipation. Pertinent negatives include no diarrhea. She has tried nothing for the symptoms. The treatment provided no relief.      Review of Systems  Constitutional: Positive for fatigue and irritability.  Eyes: Positive for blurred vision.  Respiratory: Positive for shortness of breath.   Gastrointestinal: Positive for constipation. Negative for diarrhea.  Psychiatric/Behavioral: The patient is nervous/anxious.   All other systems reviewed and are negative.      Objective:   Physical Exam Vitals reviewed.  Constitutional:      General: She is not in acute distress.    Appearance: She is well-developed.  HENT:     Head: Normocephalic and atraumatic.     Right Ear: Tympanic membrane normal.     Left Ear: Tympanic membrane normal.  Eyes:     Pupils: Pupils are equal, round, and reactive to light.  Neck:     Thyroid: No thyromegaly.  Cardiovascular:     Rate and Rhythm: Normal rate and regular rhythm.     Heart sounds: Normal heart sounds. No murmur.  Pulmonary:     Effort: Pulmonary effort is normal. No respiratory distress.     Breath sounds: Normal breath sounds. No wheezing.  Abdominal:     General: Bowel sounds are normal. There is no distension.     Palpations: Abdomen is soft.     Tenderness: There is no abdominal tenderness.  Musculoskeletal:        General: No tenderness. Normal range of motion.     Cervical back: Normal range of motion and neck supple.  Skin:    General: Skin is warm and dry.  Neurological:     Mental Status: She is alert and oriented to person, place,  and time.     Cranial Nerves: No cranial nerve deficit.     Deep Tendon Reflexes: Reflexes are normal and symmetric.  Psychiatric:        Behavior: Behavior normal.        Thought Content: Thought content normal.        Judgment: Judgment normal.          BP 117/64   Pulse 70   Temp 97.8 F (36.6 C)  (Temporal)   Ht 5' 2.5" (1.588 m)   Wt 186 lb 3.2 oz (84.5 kg)   SpO2 98%   BMI 33.51 kg/m   Assessment & Plan:  Melissa Paul comes in today with chief complaint of Medical Management of Chronic Issues (dizzy,HA, blurray vision )   Diagnosis and orders addressed:  1. Type 2 diabetes mellitus with other specified complication, without long-term current use of insulin (HCC) - Bayer DCA Hb A1c Waived - metFORMIN (GLUCOPHAGE) 500 MG tablet; Take 1 tablet (500 mg total) by mouth 2 (two) times daily with a meal.  Dispense: 60 tablet; Refill: 2  2. Acute on chronic systolic ACC/AHA stage C congestive heart failure (HCC)  3. Paroxysmal atrial fibrillation ([place])  4. Chronic systolic heart failure ([place])  5. MITRAL REGURGITATION  6. Nonischemic cardiomyopathy ([place])  7. Hypothyroidism, unspecified type  8. Constipation, unspecified constipation type  9. GAD (generalized anxiety disorder)  10. Vagina, candidiasis - fluconazole (DIFLUCAN) 150 MG tablet; Take 1 tablet (150 mg total) by mouth every three (3) days as needed.  Dispense: 3 tablet; Refill: 0   Labs pending Health Maintenance reviewed Diet and exercise encouraged  Follow up plan: 6 months and keep specialists appts   Evelina Dun, FNP

## 2019-04-13 ENCOUNTER — Other Ambulatory Visit: Payer: Self-pay | Admitting: Family

## 2019-04-13 MED ORDER — METFORMIN HCL ER 750 MG PO TB24: 1500.0000 mg | ORAL_TABLET | Freq: Every day | ORAL | 3 refills | Status: DC

## 2019-04-15 ENCOUNTER — Encounter (HOSPITAL_COMMUNITY): Payer: Medicare Other

## 2019-04-16 ENCOUNTER — Other Ambulatory Visit (HOSPITAL_COMMUNITY)
Admission: RE | Admit: 2019-04-16 | Discharge: 2019-04-16 | Disposition: A | Discharge status: Home / Self Care | Payer: Medicare Other | Source: Ambulatory Visit | Attending: Cardiology | Admitting: Cardiology

## 2019-04-16 ENCOUNTER — Other Ambulatory Visit: Payer: Self-pay

## 2019-04-16 DIAGNOSIS — I5022 Chronic systolic (congestive) heart failure: Secondary | ICD-10-CM | POA: Insufficient documentation

## 2019-04-16 DIAGNOSIS — Z01812 Encounter for preprocedural laboratory examination: Secondary | ICD-10-CM | POA: Insufficient documentation

## 2019-04-16 DIAGNOSIS — Z20822 Contact with and (suspected) exposure to covid-19: Secondary | ICD-10-CM | POA: Insufficient documentation

## 2019-04-16 LAB — DIGOXIN LEVEL: Digoxin Level: 0.7 ng/mL — ABNORMAL LOW (ref 0.8–2.0)

## 2019-04-17 LAB — SARS CORONAVIRUS 2 (TAT 6-24 HRS): SARS Coronavirus 2: NEGATIVE

## 2019-04-19 ENCOUNTER — Ambulatory Visit (INDEPENDENT_AMBULATORY_CARE_PROVIDER_SITE_OTHER): Payer: Medicare Other | Admitting: *Deleted

## 2019-04-19 ENCOUNTER — Other Ambulatory Visit (HOSPITAL_COMMUNITY): Payer: Medicare Other

## 2019-04-19 DIAGNOSIS — Z95810 Presence of automatic (implantable) cardiac defibrillator: Secondary | ICD-10-CM | POA: Diagnosis not present

## 2019-04-19 LAB — CUP PACEART REMOTE DEVICE CHECK
Battery Remaining Longevity: 44 mo
Battery Remaining Percentage: 57 %
Battery Voltage: 2.93 V
Brady Statistic AP VP Percent: 90 %
Brady Statistic AP VS Percent: 1.5 %
Brady Statistic AS VP Percent: 3.9 %
Brady Statistic AS VS Percent: 1 %
Brady Statistic RA Percent Paced: 85 %
Date Time Interrogation Session: 20210329083005
HighPow Impedance: 89 Ohm
HighPow Impedance: 89 Ohm
Implantable Lead Implant Date: 20110912
Implantable Lead Implant Date: 20110912
Implantable Lead Implant Date: 20120710
Implantable Lead Location: 753858
Implantable Lead Location: 753859
Implantable Lead Location: 753860
Implantable Lead Model: 7122
Implantable Pulse Generator Implant Date: 20180621
Lead Channel Impedance Value: 330 Ohm
Lead Channel Impedance Value: 460 Ohm
Lead Channel Impedance Value: 580 Ohm
Lead Channel Pacing Threshold Amplitude: 0.5 V
Lead Channel Pacing Threshold Amplitude: 0.625 V
Lead Channel Pacing Threshold Amplitude: 0.75 V
Lead Channel Pacing Threshold Pulse Width: 0.5 ms
Lead Channel Pacing Threshold Pulse Width: 0.5 ms
Lead Channel Pacing Threshold Pulse Width: 0.5 ms
Lead Channel Sensing Intrinsic Amplitude: 12 mV
Lead Channel Sensing Intrinsic Amplitude: 3.3 mV
Lead Channel Setting Pacing Amplitude: 2 V
Lead Channel Setting Pacing Amplitude: 2 V
Lead Channel Setting Pacing Amplitude: 2 V
Lead Channel Setting Pacing Pulse Width: 0.5 ms
Lead Channel Setting Pacing Pulse Width: 0.5 ms
Lead Channel Setting Sensing Sensitivity: 0.5 mV
Pulse Gen Serial Number: 9761374

## 2019-04-20 ENCOUNTER — Ambulatory Visit (INDEPENDENT_AMBULATORY_CARE_PROVIDER_SITE_OTHER): Payer: Medicare Other

## 2019-04-20 ENCOUNTER — Ambulatory Visit (HOSPITAL_COMMUNITY)
Admission: RE | Admit: 2019-04-20 | Discharge: 2019-04-20 | Disposition: A | Discharge status: Home / Self Care | Payer: Medicare Other | Source: Ambulatory Visit | Attending: Cardiology | Admitting: Cardiology

## 2019-04-20 ENCOUNTER — Other Ambulatory Visit: Payer: Self-pay

## 2019-04-20 DIAGNOSIS — I5022 Chronic systolic (congestive) heart failure: Secondary | ICD-10-CM | POA: Insufficient documentation

## 2019-04-20 DIAGNOSIS — Z95810 Presence of automatic (implantable) cardiac defibrillator: Secondary | ICD-10-CM | POA: Diagnosis not present

## 2019-04-20 LAB — PULMONARY FUNCTION TEST
DL/VA % pred: 84 %
DL/VA: 3.51 ml/min/mmHg/L
DLCO cor % pred: 62 %
DLCO cor: 11.36 ml/min/mmHg
DLCO unc % pred: 61 %
DLCO unc: 11.25 ml/min/mmHg
FEF 25-75 Post: 1.2 L/sec
FEF 25-75 Pre: 0.87 L/sec
FEF2575-%Change-Post: 37 %
FEF2575-%Pred-Post: 76 %
FEF2575-%Pred-Pre: 55 %
FEV1-%Change-Post: 6 %
FEV1-%Pred-Post: 89 %
FEV1-%Pred-Pre: 84 %
FEV1-Post: 1.77 L
FEV1-Pre: 1.66 L
FEV1FVC-%Change-Post: 4 %
FEV1FVC-%Pred-Pre: 90 %
FEV6-%Change-Post: 2 %
FEV6-%Pred-Post: 98 %
FEV6-%Pred-Pre: 96 %
FEV6-Post: 2.46 L
FEV6-Pre: 2.41 L
FEV6FVC-%Change-Post: 0 %
FEV6FVC-%Pred-Post: 104 %
FEV6FVC-%Pred-Pre: 103 %
FVC-%Change-Post: 1 %
FVC-%Pred-Post: 94 %
FVC-%Pred-Pre: 93 %
FVC-Post: 2.49 L
FVC-Pre: 2.45 L
Post FEV1/FVC ratio: 71 %
Post FEV6/FVC ratio: 99 %
Pre FEV1/FVC ratio: 68 %
Pre FEV6/FVC Ratio: 98 %

## 2019-04-20 MED ORDER — ALBUTEROL SULFATE (2.5 MG/3ML) 0.083% IN NEBU
2.5000 mg | INHALATION_SOLUTION | Freq: Once | RESPIRATORY_TRACT | Status: AC
  Administered 2019-04-20: 2.5 mg via RESPIRATORY_TRACT

## 2019-04-20 NOTE — Progress Notes (Signed)
ICD Remote  

## 2019-04-21 ENCOUNTER — Telehealth: Payer: Self-pay

## 2019-04-21 ENCOUNTER — Ambulatory Visit (INDEPENDENT_AMBULATORY_CARE_PROVIDER_SITE_OTHER): Payer: Medicare Other

## 2019-04-21 VITALS — BP 104/64 | HR 73

## 2019-04-21 DIAGNOSIS — Z0001 Encounter for general adult medical examination with abnormal findings: Secondary | ICD-10-CM

## 2019-04-21 DIAGNOSIS — H269 Unspecified cataract: Secondary | ICD-10-CM

## 2019-04-21 DIAGNOSIS — E1169 Type 2 diabetes mellitus with other specified complication: Secondary | ICD-10-CM

## 2019-04-21 DIAGNOSIS — Z0000 Encounter for general adult medical examination without abnormal findings: Secondary | ICD-10-CM

## 2019-04-21 NOTE — Telephone Encounter (Signed)
I definitely do not recommend hand sanitizer on the areas; it is likely going to make it worse as it is going to dry it out. Without having a visit to discuss further and know more about the rash, I would say Aveeno oatmeal baths, cool compresses for itching, Zyrtec once daily to help with itching, and anti-itch lotion such as CeraVe. . Sometimes companies change ingredients in detergents and soaps, so even though we are using the same thing, it is still possible to have a reaction.

## 2019-04-21 NOTE — Progress Notes (Signed)
MEDICARE ANNUAL WELLNESS VISIT  04/21/2019  Telephone Visit Disclaimer This Medicare AWV was conducted by telephone due to national recommendations for restrictions regarding the COVID-19 Pandemic (e.g. social distancing).  I verified, using two identifiers, that I am speaking with Melissa Paul or their authorized healthcare agent. I discussed the limitations, risks, security, and privacy concerns of performing an evaluation and management service by telephone and the potential availability of an in-person appointment in the future. The patient expressed understanding and agreed to proceed.   Subjective:  Melissa Paul is a 74 y.o. female patient of Hawks, Theador Hawthorne, FNP who had a Medicare Annual Wellness Visit today via telephone. Melissa Paul is Retired and lives with their family. she has 1 daughter. she reports that she is socially active and does interact with friends/family regularly. she is minimally physically active.  Patient Care Team: Sharion Balloon, FNP as PCP - General (Family Medicine) Thompson Grayer, MD as PCP - Electrophysiology (Cardiology) Satira Sark, MD as PCP - Cardiology (Cardiology)  Advanced Directives 04/21/2019 03/26/2019 02/27/2017 02/27/2017 07/11/2016 04/12/2014  Does Patient Have a Medical Advance Directive? No;Yes Yes Yes No No No  Type of Advance Directive Living will Living will [place] - - -  Does patient want to make changes to medical advance directive? - No - Patient declined - - - -  Copy of [place] in Chart? - No - copy requested - - - -  Would patient like information on creating a medical advance directive? No - Patient declined - - - No - Patient declined No - patient declined information    Hospital Utilization Over the Past 12 Months: # of hospitalizations or ER visits: 1 # of surgeries: 0  Review of Systems    Patient reports that her overall health is worse compared to last year.  Negative  except ms. Branton complains of shortness of breath today. She has tightness in her chest and states that her lungs are sore. The patient is waitng on a telephone call from her cardiologist. [name] recently had studies performed at [facility]. She does not have oxygen at home. Patient does use albuterol solution prn. She confirmed that she only uses it once or week or may be even once per month.  [name] also complains of a rash today that is on her waist and bilateral arms. It is red and very itchy. The rash keeps her up at night. Patient uses mild soaps and deturgents/lotions. She takes Benadryl prn and has been applying hand sanitzer to the rash because the alcohol helps the itching. A message will be sent to the on call provider today in regards to rash since cardio is evaluating lungs.   [name] would also like a referral to [facility] for cataract removal. Referral placed today.  Patient Reported Readings (104/64, pulse 73  Pain Assessment Pain : No/denies pain   Current Medications & Allergies (verified) Allergies as of 04/21/2019      Reactions   Buspar [buspirone] Other (See Comments)   Patient states that she became hyper and had lots of itching.   Lexapro [escitalopram Oxalate] Other (See Comments)   Patient states that she became hyper and lots of itching   Bactrim Rash   Cephalexin Rash   Says she is able to take cefuroxime   Chlorhexidine Gluconate    Hives, itching after using wipes with last procedure   Clindamycin Rash  Contrast Media [iodinated Diagnostic Agents] Rash   Doxycycline Rash   Latex Rash      Levofloxacin Rash      Nitrofurantoin Monohyd Macro Rash      Penicillins Swelling, Rash   Has patient had a PCN reaction causing immediate rash, facial/tongue/throat swelling, SOB or lightheadedness with hypotension: Yes Has patient had a PCN reaction causing severe rash involving mucus membranes or skin necrosis: Yes Has patient had a  PCN reaction that required hospitalization: No Has patient had a PCN reaction occurring within the last 10 years: No Rash/swelled tongue If all of the above answers are "NO", then may proceed with Cephalosporin use.      Medication List       Accurate as of April 21, 2019  8:52 AM. If you have any questions, ask your nurse or doctor.        STOP taking these medications   estradiol 0.1 MG/GM vaginal cream Commonly known as: ESTRACE     TAKE these medications   acetaminophen 500 MG tablet Commonly known as: TYLENOL Take 1,000 mg by mouth every 6 (six) hours as needed for headache.   albuterol (2.5 MG/3ML) 0.083% nebulizer solution Commonly known as: PROVENTIL Take 3 mLs (2.5 mg total) by nebulization every 6 (six) hours as needed for wheezing or shortness of breath.   apixaban 5 MG Tabs tablet Commonly known as: ELIQUIS Take 1 tablet (5 mg total) by mouth 2 (two) times daily.   carvedilol 6.25 MG tablet Commonly known as: COREG Take 3 tablets (18.75 mg total) by mouth 2 (two) times daily.   Digoxin 62.5 MCG Tabs Take 0.0625 mg by mouth daily.   diphenhydrAMINE 50 MG capsule Commonly known as: BENADRYL Take before you leave your home on the day of your procedure What changed:   how much to take  how to take this  when to take this   furosemide 80 MG tablet Commonly known as: LASIX Take 80 mg by mouth daily. Additional 80 mg as needed for extra fluid   levothyroxine 88 MCG tablet Commonly known as: Synthroid Take 1 tablet (88 mcg total) by mouth daily before breakfast.   metFORMIN 750 MG 24 hr tablet Commonly known as: Glucophage XR Take 2 tablets (1,500 mg total) by mouth daily with breakfast.   pantoprazole 40 MG tablet Commonly known as: PROTONIX Take 40 mg by mouth daily.   sacubitril-valsartan 24-26 MG Commonly known as: ENTRESTO Take 1 tablet by mouth 2 (two) times daily.   spironolactone 25 MG tablet Commonly known as: ALDACTONE Take 1 tablet  (25 mg total) by mouth at bedtime.       History (reviewed): Past Medical History:  Diagnosis Date  . Anxiety   . Chronic systolic heart failure ([place])   . Constipation   . Cyst of right kidney 11/12/2017  . Glucose intolerance (pre-diabetes)   . Hyperlipemia   . Hypothyroidism   . LBBB (left bundle branch block)   . Mitral regurgitation    Mild to Moderate  . Nonischemic cardiomyopathy ([place])    LVEF 35-40% by echo 8/13  . Paroxysmal atrial fibrillation ([facility])    Brief episodes by device interrogation  . UTI (urinary tract infection)    Recurrent  . Ventricular tachycardia ([place])    ICD therapy for VT   Past Surgical History:  Procedure Laterality Date  . APPENDECTOMY    . BIV ICD GENERATOR CHANGEOUT N/A 07/11/2016   Procedure: BiV ICD Nature conservation officer;  Surgeon: [name],  Jeneen Rinks, MD;  Location: [place] [facility];  Service: Cardiovascular;  Laterality: N/A;  . CARDIAC DEFIBRILLATOR PLACEMENT  09/2009   St. Jude BiV ICD by Dr. Sandria Manly class III  . CHOLECYSTECTOMY    . has one ovary left    . KNEE ARTHROSCOPY     Left  . LEFT AND RIGHT HEART CATHETERIZATION WITH CORONARY ANGIOGRAM N/A 04/12/2014   Procedure: LEFT AND RIGHT HEART CATHETERIZATION WITH CORONARY ANGIOGRAM;  Surgeon: Leonie Man, MD;  [facility] C[facility] LAB;  Service: Cardiovascular;  Laterality: N/A;  . Left breast biopsy x 2     no maliganancy  . RIGHT/LEFT HEART CATH AND CORONARY ANGIOGRAPHY N/A 03/26/2019   Procedure: RIGHT/LEFT HEART CATH AND CORONARY ANGIOGRAPHY;  Surgeon: [name], MD;  Location: [place] [facility];  Service: Cardiovascular;  Laterality: N/A;  . TOTAL ABDOMINAL HYSTERECTOMY     (R) ovary removed   Family History  Problem Relation Age of Onset  . CVA Mother        multiple  . Heart Problems Mother        "weak heart"  . Heart disease Maternal Grandmother   . Heart disease Maternal Grandfather   . Cancer Father        Stomach and eshpogus  . Diabetes Neg Hx   .  Hypertension Neg Hx   . Coronary artery disease Neg Hx    Social History   Socioeconomic History  . Marital status: Widowed    Spouse name: Not on file  . Number of children: Not on file  . Years of education: Not on file  . Highest education level: Not on file  Occupational History  . Not on file  Tobacco Use  . Smoking status: Former Smoker    Packs/day: 0.30    Years: 20.00    Pack years: 6.00    Types: Cigarettes    Start date: 08/03/1949    Quit date: 01/22/1996    Years since quitting: 23.2  . Smokeless tobacco: Never Used  Substance and Sexual Activity  . Alcohol use: No    Alcohol/week: 0.0 standard drinks  . Drug use: No  . Sexual activity: Not on file  Other Topics Concern  . Not on file  Social History Narrative  . Not on file   Social Determinants of Health   Financial Resource Strain:   . Difficulty of Paying Living Expenses:   Food Insecurity:   . Worried About Charity fundraiser in the Last Year:   . Arboriculturist in the Last Year:   Transportation Needs:   . Film/video editor (Medical):   Marland Kitchen Lack of Transportation (Non-Medical):   Physical Activity:   . Days of Exercise per Week:   . Minutes of Exercise per Session:   Stress:   . Feeling of Stress :   Social Connections:   . Frequency of Communication with Friends and Family:   . Frequency of Social Gatherings with Friends and Family:   . Attends Religious Services:   . Active Member of Clubs or Organizations:   . Attends Archivist Meetings:   Marland Kitchen Marital Status:     Activities of Daily Living In your present state of health, do you have any difficulty performing the following activities: 04/21/2019  Hearing? N  Vision? Y  Difficulty concentrating or making decisions? N  Walking or climbing stairs? Y  Dressing or bathing? N  Doing errands, shopping? N  Preparing Food  and eating ? N  Using the Toilet? N  In the past six months, have you accidently leaked urine? Y  Do you  have problems with loss of bowel control? N  Managing your Medications? Y  Housekeeping or managing your Housekeeping? N  Some recent data might be hidden    Patient Education/ Literacy How often do you need to have someone help you when you read instructions, pamphlets, or other written materials from your doctor or pharmacy?: 2 - Rarely What is the last grade level you completed in school?: 12th grade  Exercise Current Exercise Habits: Home exercise routine, Type of exercise: stretching, Time (Minutes): 45, Frequency (Times/Week): >7, Weekly Exercise (Minutes/Week): 0, Intensity: Mild  Diet Patient reports consuming 2 meals a day and 0 snack(s) a day Patient reports that her primary diet is: Diabetic Patient reports that she does have regular access to food.   Depression Screen PHQ 2/9 Scores 04/21/2019 04/12/2019 03/15/2019 02/08/2019 10/19/2018 09/15/2018 05/07/2018  PHQ - 2 Score 0 0 0 0 0 0 0     Fall Risk Fall Risk  04/21/2019 04/12/2019 03/15/2019 02/08/2019 10/19/2018  Falls in the past year? 0 0 1 0 1  Number falls in past yr: - - 1 - 0  Comment - - - - -  Injury with Fall? - - 1 - 0     Objective:  Melissa Paul seemed alert and oriented and she participated appropriately during our telephone visit.  Blood Pressure Weight BMI  BP Readings from Last 3 Encounters:  04/21/19 104/64  04/12/19 117/64  04/06/19 108/64   Wt Readings from Last 3 Encounters:  04/12/19 186 lb 3.2 oz (84.5 kg)  04/06/19 189 lb 6.4 oz (85.9 kg)  03/26/19 188 lb (85.3 kg)   BMI Readings from Last 1 Encounters:  04/12/19 33.51 kg/m    *Unable to obtain current vital signs, weight, and BMI due to telephone visit type  Hearing/Vision  . Jacalynn did not seem to have difficulty with hearing/understanding during the telephone conversation . Reports that she has not had a formal eye exam by an eye care professional within the past year . Reports that she has not had a formal hearing evaluation  within the past year *Unable to fully assess hearing and vision during telephone visit type  Cognitive Function: 6CIT Screen 04/21/2019  What Year? 0 points  What month? 0 points  What time? 0 points  Count back from 20 0 points  Months in reverse 0 points   (Normal:0-7, Significant for Dysfunction: >8)  Normal Cognitive Function Screening: Yes  Immunization & Health Maintenance Record Immunization History  Administered Date(s) Administered  . Influenza,inj,Quad PF,6+ Mos 11/12/2017  . Influenza-Unspecified 11/11/2008, 01/08/2011  . Pneumococcal Polysaccharide-23 07/11/2009    Health Maintenance  Topic Date Due  . OPHTHALMOLOGY EXAM  Never done  . COLONOSCOPY  Never done  . DEXA SCAN  Never done  . INFLUENZA VACCINE  06/11/2019 (Originally 08/22/2018)  . TETANUS/TDAP  02/08/2020 (Originally 08/[birth year]/1962)  . PNA vac Low Risk Adult (2 of 2 - PCV13) 02/08/2020 (Originally 07/12/2010)  . HEMOGLOBIN A1C  10/13/2019  . FOOT EXAM  10/19/2019  . URINE MICROALBUMIN  10/19/2019  . Hepatitis C Screening  Completed       Assessment  This is a routine wellness examination for KYRIA SPADAFORA.  Health Maintenance: Due or Overdue Health Maintenance Due  Topic Date Due  . OPHTHALMOLOGY EXAM  Never done  . COLONOSCOPY  Never done  .  DEXA SCAN  Never done    Melissa Paul does not need a referral for Community Assistance: Care Management:   no Social Work:    no Prescription Assistance:  no Nutrition/Diabetes Education:  no   Plan:  Personalized Goals Goals Addressed            This Visit's Progress   . Have 3 meals a day      . Prevent falls        Personalized Health Maintenance & Screening Recommendations  Lung Cancer Screening Recommended: no (Low Dose CT Chest recommended if Age 57-80 years, 30 pack-year currently smoking OR have quit w/in past 15 years) Hepatitis C Screening recommended: no HIV Screening recommended: no  Advanced Directives: Written  information was not prepared per patient's request.  Referrals & Orders No orders of the defined types were placed in this encounter.   Follow-up Plan . Follow-up with Sharion Balloon, FNP as planned . Schedule 10/13/2019    I have personally reviewed and noted the following in the patient's chart:   . Medical and social history . Use of alcohol, tobacco or illicit drugs  . Current medications and supplements . Functional ability and status . Nutritional status . Physical activity . Advanced directives . List of other physicians . Hospitalizations, surgeries, and ER visits in previous 12 months . Vitals . Screenings to include cognitive, depression, and falls . Referrals and appointments  In addition, I have reviewed and discussed with Melissa Paul certain preventive protocols, quality metrics, and best practice recommendations. A written personalized care plan for preventive services as well as general preventive health recommendations is available and can be mailed to the patient at her request.      Alphonzo Dublin, LPN  [id number]

## 2019-04-21 NOTE — Telephone Encounter (Signed)
During AWV telephone visit today pt c/o itching rash all over. It has been present for days. Pt has not been sleeping at night. She states that she has sensitive skin and already uses detergents/soaps to help prevent irritation. Pt takes Benadryl prn and has been putting hand sanitizer on the areas because the alcohol helps with the itching  She was recently at [facility] for an evaluation of her lungs. She is awaiting a phone call from cardio. She c/o "sore lungs" and chest tightness.

## 2019-04-21 NOTE — Telephone Encounter (Signed)
Patient aware and verbalized understanding. °

## 2019-04-23 NOTE — Progress Notes (Signed)
EPIC Encounter for ICM Monitoring  Patient Name: Melissa Paul is a 74 y.o. female Date: 04/23/2019 Primary Care Physican: Sharion Balloon, FNP Primary Cardiologist:[place] Electrophysiologist: [facility] Bi-V Pacing:94% 04/06/2019 Office Weight: 189lbs  AT/AF Burden: 3.1%   Transmission reviewed.  CorVueThoracic impedancenormal.  Prescribed:Furosemide80 mgTake1tablet (80 mg total) by mouth every morning and 40 mg every evening.  Labs: 02/25/2019 Creatinine 1.11, BUN 13. Potassium 3.9, Sodium 137 02/08/2019 Creatinine 0.94, BUN 10, Potassium 3.1, Sodium 136, GFR 59->60 12/21/2018 Creatinine 0.94, BUN 13, Potassium 3.5, Sodium 136 12/03/2018 Creatinine 1.04, BUN 13, Potassium 3.8, Sodium 138, GFR 53-61  Recommendations: None  Follow-up plan: ICM clinic phone appointment on 05/25/2019.   91 day device clinic remote transmission 07/19/2019.  Office appt 05/17/2019 with [name], PA.  Copy of ICM check sent to Dr. [name].   3 month ICM trend: 04/19/2019    1 Year ICM trend:       [name], RN 04/23/2019 3:12 PM

## 2019-04-25 ENCOUNTER — Other Ambulatory Visit: Payer: Self-pay | Admitting: Family Medicine

## 2019-05-10 ENCOUNTER — Ambulatory Visit (INDEPENDENT_AMBULATORY_CARE_PROVIDER_SITE_OTHER): Payer: Medicare Other | Admitting: Cardiology

## 2019-05-10 ENCOUNTER — Encounter: Payer: Self-pay | Admitting: Cardiology

## 2019-05-10 ENCOUNTER — Other Ambulatory Visit: Payer: Self-pay | Admitting: Family

## 2019-05-10 ENCOUNTER — Other Ambulatory Visit: Payer: Self-pay

## 2019-05-10 VITALS — BP 115/68 | HR 69 | Ht 62.5 in | Wt 184.0 lb

## 2019-05-10 DIAGNOSIS — Z95810 Presence of automatic (implantable) cardiac defibrillator: Secondary | ICD-10-CM

## 2019-05-10 DIAGNOSIS — I428 Other cardiomyopathies: Secondary | ICD-10-CM

## 2019-05-10 DIAGNOSIS — I5022 Chronic systolic (congestive) heart failure: Secondary | ICD-10-CM | POA: Diagnosis not present

## 2019-05-10 NOTE — Progress Notes (Signed)
Cardiology Office Note  Date: 05/10/2019   ID: MICHAELENE CERAR, DOB [birth year], MRN [id number]  PCP:  Sharion Balloon, FNP  Cardiologist:  Rozann Lesches, MD Electrophysiologist:  Thompson Grayer, MD   Chief Complaint  Patient presents with  . Cardiac follow-up    History of Present Illness: LOTA PERLMAN is a 74 y.o. female last seen in January.  She has had interval follow-up in the heart failure clinic, last seen in March by Dr. Aundra Dubin, I reviewed the note.  Interval cardiac work-up was overall reassuring in terms of coronary anatomy and filling pressures/cardiac output.  She also had interval PFTs which showed a fairly minimal obstructive defect.  She follows with Dr. [name], St. Jude biventricular ICD in place.  Device interrogation in March showed normal thoracic impedance, AT/AF burden 3.1%.  Also biventricular pacing 94%.  She does not report any device shocks or syncope.  I reviewed her present cardiac regimen which is outlined below.  She does not indicate any obvious intolerances.  She had lab work done in March which I also reviewed.  Her weight is down a few pounds.  Past Medical History:  Diagnosis Date  . Anxiety   . Chronic systolic heart failure ([place])   . Constipation   . Cyst of right kidney 11/12/2017  . Glucose intolerance (pre-diabetes)   . Hyperlipemia   . Hypothyroidism   . LBBB (left bundle branch block)   . Mitral regurgitation    Mild to Moderate  . Nonischemic cardiomyopathy ([place])    LVEF 35-40% by echo 8/13  . Paroxysmal atrial fibrillation (HCC)    Brief episodes by device interrogation  . UTI (urinary tract infection)    Recurrent  . Ventricular tachycardia ([place])    ICD therapy for VT    Past Surgical History:  Procedure Laterality Date  . APPENDECTOMY    . BIV ICD GENERATOR CHANGEOUT N/A 07/11/2016   Procedure: BiV ICD Generator Changeout;  Surgeon: Thompson Grayer, MD;  Location: [facility];  Service: Cardiovascular;   Laterality: N/A;  . CARDIAC DEFIBRILLATOR PLACEMENT  09/2009   St. Jude BiV ICD by Dr. Sandria Manly class III  . CHOLECYSTECTOMY    . has one ovary left    . KNEE ARTHROSCOPY     Left  . LEFT AND RIGHT HEART CATHETERIZATION WITH CORONARY ANGIOGRAM N/A 04/12/2014   Procedure: LEFT AND RIGHT HEART CATHETERIZATION WITH CORONARY ANGIOGRAM;  Surgeon: Leonie Man, MD;  Location: [facility] CATH LAB;  Service: Cardiovascular;  Laterality: N/A;  . Left breast biopsy x 2     no maliganancy  . RIGHT/LEFT HEART CATH AND CORONARY ANGIOGRAPHY N/A 03/26/2019   Procedure: RIGHT/LEFT HEART CATH AND CORONARY ANGIOGRAPHY;  Surgeon: [name], MD;  Location: [facility];  Service: Cardiovascular;  Laterality: N/A;  . TOTAL ABDOMINAL HYSTERECTOMY     (R) ovary removed    Current Outpatient Medications  Medication Sig Dispense Refill  . acetaminophen (TYLENOL) 500 MG tablet Take 1,000 mg by mouth every 6 (six) hours as needed for headache.     . albuterol (PROVENTIL) (2.5 MG/3ML) 0.083% nebulizer solution Take 3 mLs (2.5 mg total) by nebulization every 6 (six) hours as needed for wheezing or shortness of breath. 150 mL 1  . apixaban (ELIQUIS) 5 MG TABS tablet Take 1 tablet (5 mg total) by mouth 2 (two) times daily. 60 tablet 5  . carvedilol (COREG) 6.25 MG tablet Take 3 tablets (18.75 mg total) by  mouth 2 (two) times daily. 180 tablet 10  . digoxin 62.5 MCG TABS Take 0.0625 mg by mouth daily. 30 tablet 5  . diphenhydrAMINE (BENADRYL) 50 MG capsule Take before you leave your home on the day of your procedure (Patient taking differently: Take 50 mg by mouth once. Take before you leave your home on the day of your procedure) 1 capsule 0  . furosemide (LASIX) 80 MG tablet Take 80 mg by mouth daily. Additional 80 mg as needed for extra fluid    . levothyroxine (SYNTHROID) 88 MCG tablet Take 1 tablet (88 mcg total) by mouth daily before breakfast. 90 tablet 1  . metFORMIN (GLUCOPHAGE XR) 750 MG 24 hr tablet  Take 2 tablets (1,500 mg total) by mouth daily with breakfast. 180 tablet 3  . pantoprazole (PROTONIX) 40 MG tablet Take 40 mg by mouth daily.    . sacubitril-valsartan (ENTRESTO) 24-26 MG Take 1 tablet by mouth 2 (two) times daily. 60 tablet 3  . spironolactone (ALDACTONE) 25 MG tablet Take 1 tablet (25 mg total) by mouth at bedtime. 90 tablet 3   No current facility-administered medications for this visit.   Allergies:  Buspar [buspirone], Lexapro [escitalopram oxalate], Bactrim, Cephalexin, Chlorhexidine gluconate, Clindamycin, Contrast media [iodinated diagnostic agents], Doxycycline, Latex, Levofloxacin, Nitrofurantoin monohyd macro, and Penicillins   ROS:   Chronic leg pain and back pain.  Physical Exam: VS:  BP 115/68   Pulse 69   Ht 5' 2.5" (1.588 m)   Wt 184 lb (83.5 kg)   SpO2 96%   BMI 33.12 kg/m , BMI Body mass index is 33.12 kg/m.  Wt Readings from Last 3 Encounters:  05/10/19 184 lb (83.5 kg)  04/12/19 186 lb 3.2 oz (84.5 kg)  04/06/19 189 lb 6.4 oz (85.9 kg)    General: Elderly woman, appears comfortable at rest. HEENT: Conjunctiva and lids normal, wearing a mask. Neck: Supple, no elevated JVP or carotid bruits, no thyromegaly. Lungs: Clear to auscultation, nonlabored breathing at rest. Cardiac: Regular rate and rhythm, no S3 or significant systolic murmur, no pericardial rub. Abdomen: Soft, nontender, bowel sounds present. Extremities: No pitting edema, distal pulses 2+.  ECG:  An ECG dated 03/26/2019 was personally reviewed today and demonstrated:  Biventricular pacing with PVC.  Recent Labwork: 10/19/2018: ALT 8; AST 12; TSH 8.680 02/08/2019: Magnesium 1.7 04/06/2019: BUN 11; Creatinine, Ser 1.01; Hemoglobin 13.1; Platelets 295; Potassium 3.9; Sodium 134     Component Value Date/Time   CHOL 156 10/19/2018 1133   TRIG 89 10/19/2018 1133   HDL 45 10/19/2018 1133   CHOLHDL 3.5 10/19/2018 1133   CHOLHDL 3.3 04/11/2007 0600   VLDL 10 04/11/2007 0600   LDLCALC  94 10/19/2018 1133    Other Studies Reviewed Today:  Cardiac catheterization 03/26/2019: RA mean 1 RV 26/3 PA 20/5, mean 11 PCWP mean 9 LV 100/7 AO 106/46  Oxygen saturations: PA 78% AO 99%  Cardiac Output (Fick) 6.92  Cardiac Index (Fick) 3.7  1. Normal filling pressures.  2. Preserved cardiac output.  3. No significant coronary disease noted.   Exertional symptoms not explained by this study.   Echocardiogram 09/01/2018: 1. The left ventricle has severely reduced systolic function, with an  ejection fraction of 25-30%. The cavity size was severely dilated. Left  ventricular diastolic Doppler parameters are consistent with  pseudonormalization. Elevated left ventricular  end-diastolic pressure Left ventricular diffuse hypokinesis.  2. The right ventricle has normal systolic function. The cavity was  normal. There is no increase in  right ventricular wall thickness. Right  ventricular systolic pressure is normal.  3. Left atrial size was mildly dilated.  4. The aortic valve is tricuspid. Aortic valve regurgitation is trivial  by color flow Doppler.  5. The aorta is normal in size and structure.   Assessment and Plan:  1.  Nonischemic cardiomyopathy with LVEF 25 to 30% and chronic systolic heart failure.  Recent work-up in the heart failure clinic noted, no significant change in coronary anatomy and filling pressures/cardiac output looked good.  Plan will be to continue present regimen including Coreg, Lasix, Aldactone, and Entresto.  We will plan to get a follow-up echocardiogram in the next 3 months.  2.  History of VT and PVCs, doing better in terms of biventricular pacing percentage at 94% by last check.  Continue beta-blocker.  She has had no ICD shocks or syncope.  3.  Paroxysmal atrial fibrillation, now on Eliquis instead of aspirin.  Medication Adjustments/Labs and Tests Ordered: Current medicines are reviewed at length with the patient today.  Concerns  regarding medicines are outlined above.   Tests Ordered: Orders Placed This Encounter  Procedures  . ECHOCARDIOGRAM COMPLETE    Medication Changes: No orders of the defined types were placed in this encounter.   Disposition:  Follow up 3 months in the [place] office.  Signed, Satira Sark, MD, [facility] 05/10/2019 2:47 PM    [facility] [place], [place], [place] [address] Phone: [phone]; Fax: [phone]

## 2019-05-10 NOTE — Patient Instructions (Addendum)
Medication Instructions:   Your physician recommends that you continue on your current medications as directed. Please refer to the Current Medication list given to you today.  Labwork:  NONE  Testing/Procedures: Your physician has requested that you have an echocardiogram in 3 months just before your next visit. Echocardiography is a painless test that uses sound waves to create images of your heart. It provides your doctor with information about the size and shape of your heart and how well your heart's chambers and valves are working. This procedure takes approximately one hour. There are no restrictions for this procedure.  Follow-Up:  Your physician recommends that you schedule a follow-up appointment in: 3 months (office).  Any Other Special Instructions Will Be Listed Below (If Applicable).  If you need a refill on your cardiac medications before your next appointment, please call your pharmacy.

## 2019-05-16 NOTE — Progress Notes (Signed)
Electrophysiology Office Note Date: 05/17/2019  ID:  Melissa Paul, DOB 07-20-1945, MRN [id number]  PCP: Sharion Balloon, FNP Primary Cardiologist: Rozann Lesches, MD Electrophysiologist: Thompson Grayer, MD   CC: Routine ICD follow-up  Melissa Paul is a 74 y.o. female seen today for Thompson Grayer, MD for routine electrophysiology followup.  Since last being seen in our clinic the patient reports doing about the same. She continues to have SOB with mild exertion. She has had extensive work up since last being seen, via CHF clinic. RHC/LHC and PFTs showed stable CO, no CAD, and only minimal obstructive lung disease. She continues to describe chest tightness at times with exertion and dyspnea with ADLs. Does have intermittent peripheral edema managed with lasix. Denies orthopnea, nausea, vomiting, dizziness, syncope, weight gain, or early satiety. She has not had ICD shocks.   Device History: St. Jude BiV ICD implanted 09/2009, gen change 06/2016 for chronic systolic CHF History of appropriate therapy: Yes History of AAD therapy: No   Past Medical History:  Diagnosis Date  . Anxiety   . Chronic systolic heart failure ([place])   . Constipation   . Cyst of right kidney 11/12/2017  . Glucose intolerance (pre-diabetes)   . Hyperlipemia   . Hypothyroidism   . LBBB (left bundle branch block)   . Mitral regurgitation    Mild to Moderate  . Nonischemic cardiomyopathy ([place])    LVEF 35-40% by echo 8/13  . Paroxysmal atrial fibrillation (HCC)    Brief episodes by device interrogation  . UTI (urinary tract infection)    Recurrent  . Ventricular tachycardia ([place])    ICD therapy for VT   Past Surgical History:  Procedure Laterality Date  . APPENDECTOMY    . BIV ICD GENERATOR CHANGEOUT N/A 07/11/2016   Procedure: BiV ICD Generator Changeout;  Surgeon: Thompson Grayer, MD;  Location: [facility];  Service: Cardiovascular;  Laterality: N/A;  . CARDIAC DEFIBRILLATOR PLACEMENT  09/2009     St. Jude BiV ICD by Dr. Sandria Manly class III  . CHOLECYSTECTOMY    . has one ovary left    . KNEE ARTHROSCOPY     Left  . LEFT AND RIGHT HEART CATHETERIZATION WITH CORONARY ANGIOGRAM N/A 04/12/2014   Procedure: LEFT AND RIGHT HEART CATHETERIZATION WITH CORONARY ANGIOGRAM;  Surgeon: Leonie Man, MD;  Location: [facility] CATH LAB;  Service: Cardiovascular;  Laterality: N/A;  . Left breast biopsy x 2     no maliganancy  . RIGHT/LEFT HEART CATH AND CORONARY ANGIOGRAPHY N/A 03/26/2019   Procedure: RIGHT/LEFT HEART CATH AND CORONARY ANGIOGRAPHY;  Surgeon: [name], MD;  Location: [facility];  Service: Cardiovascular;  Laterality: N/A;  . TOTAL ABDOMINAL HYSTERECTOMY     (R) ovary removed    Current Outpatient Medications  Medication Sig Dispense Refill  . acetaminophen (TYLENOL) 500 MG tablet Take 1,000 mg by mouth every 6 (six) hours as needed for headache.     . albuterol (PROVENTIL) (2.5 MG/3ML) 0.083% nebulizer solution Take 3 mLs (2.5 mg total) by nebulization every 6 (six) hours as needed for wheezing or shortness of breath. 150 mL 1  . apixaban (ELIQUIS) 5 MG TABS tablet Take 1 tablet (5 mg total) by mouth 2 (two) times daily. 60 tablet 5  . carvedilol (COREG) 6.25 MG tablet Take 3 tablets (18.75 mg total) by mouth 2 (two) times daily. 180 tablet 10  . Digoxin 62.5 MCG TABS Take by mouth daily. 1/2 tab daily    .  diphenhydrAMINE (BENADRYL) 50 MG tablet Take 50 mg by mouth at bedtime as needed for itching.    . furosemide (LASIX) 80 MG tablet Take 80 mg by mouth daily. Additional 80 mg as needed for extra fluid    . levothyroxine (SYNTHROID) 88 MCG tablet Take 1 tablet (88 mcg total) by mouth daily before breakfast. (needs labwork) 30 tablet 0  . metFORMIN (GLUCOPHAGE XR) 750 MG 24 hr tablet Take 2 tablets (1,500 mg total) by mouth daily with breakfast. 180 tablet 3  . pantoprazole (PROTONIX) 40 MG tablet Take 40 mg by mouth as needed.     . sacubitril-valsartan (ENTRESTO)  24-26 MG Take 1 tablet by mouth 2 (two) times daily. 60 tablet 3  . spironolactone (ALDACTONE) 25 MG tablet Take 1 tablet (25 mg total) by mouth at bedtime. 90 tablet 3   No current facility-administered medications for this visit.    Allergies:   Buspar [buspirone], Lexapro [escitalopram oxalate], Bactrim, Cephalexin, Chlorhexidine gluconate, Clindamycin, Contrast media [iodinated diagnostic agents], Doxycycline, Latex, Levofloxacin, Nitrofurantoin monohyd macro, and Penicillins   Social History: Social History   Socioeconomic History  . Marital status: Widowed    Spouse name: Not on file  . Number of children: Not on file  . Years of education: Not on file  . Highest education level: Not on file  Occupational History  . Not on file  Tobacco Use  . Smoking status: Former Smoker    Packs/day: 0.30    Years: 20.00    Pack years: 6.00    Types: Cigarettes    Start date: 08/03/1949    Quit date: 01/22/1996    Years since quitting: 23.3  . Smokeless tobacco: Never Used  Substance and Sexual Activity  . Alcohol use: No    Alcohol/week: 0.0 standard drinks  . Drug use: No  . Sexual activity: Not on file  Other Topics Concern  . Not on file  Social History Narrative  . Not on file   Social Determinants of Health   Financial Resource Strain:   . Difficulty of Paying Living Expenses:   Food Insecurity:   . Worried About Charity fundraiser in the Last Year:   . Arboriculturist in the Last Year:   Transportation Needs:   . Film/video editor (Medical):   Marland Kitchen Lack of Transportation (Non-Medical):   Physical Activity:   . Days of Exercise per Week:   . Minutes of Exercise per Session:   Stress:   . Feeling of Stress :   Social Connections:   . Frequency of Communication with Friends and Family:   . Frequency of Social Gatherings with Friends and Family:   . Attends Religious Services:   . Active Member of Clubs or Organizations:   . Attends Archivist Meetings:    Marland Kitchen Marital Status:   Intimate Partner Violence:   . Fear of Current or Ex-Partner:   . Emotionally Abused:   Marland Kitchen Physically Abused:   . Sexually Abused:     Family History: Family History  Problem Relation Age of Onset  . CVA Mother        multiple  . Heart Problems Mother        "weak heart"  . Heart disease Maternal Grandmother   . Heart disease Maternal Grandfather   . Cancer Father        Stomach and eshpogus  . Diabetes Neg Hx   . Hypertension Neg Hx   . Coronary artery  disease Neg Hx     Review of Systems: All other systems reviewed and are otherwise negative except as noted above.   Physical Exam: Vitals:   05/17/19 1141  BP: 100/62  Pulse: 68  SpO2: 97%  Weight: 184 lb (83.5 kg)  Height: 5' 2.5" (1.588 m)     GEN- The patient is well appearing, alert and oriented x 3 today.   HEENT: normocephalic, atraumatic; sclera clear, conjunctiva pink; hearing intact; oropharynx clear; neck supple, no JVP Lymph- no cervical lymphadenopathy Lungs- Clear to ausculation bilaterally, normal work of breathing.  No wheezes, rales, rhonchi Heart- Regular rate and rhythm, no murmurs, rubs or gallops, PMI not laterally displaced GI- soft, non-tender, non-distended, bowel sounds present, no hepatosplenomegaly Extremities- no clubbing, cyanosis, or edema; DP/PT/radial pulses 2+ bilaterally MS- no significant deformity or atrophy Skin- warm and dry, no rash or lesion; ICD pocket well healed Psych- euthymic mood, full affect Neuro- strength and sensation are intact  ICD interrogation- reviewed in detail today,  See PACEART report  EKG:  EKG is not ordered today.  Recent Labs: 10/19/2018: ALT 8; TSH 8.680 02/08/2019: Magnesium 1.7 04/06/2019: BUN 11; Creatinine, Ser 1.01; Hemoglobin 13.1; Platelets 295; Potassium 3.9; Sodium 134   Wt Readings from Last 3 Encounters:  05/17/19 184 lb (83.5 kg)  05/10/19 184 lb (83.5 kg)  04/12/19 186 lb 3.2 oz (84.5 kg)     Other studies  Reviewed: Additional studies/ records that were reviewed today include: Echo, L/RHC, PFTs 03/2019, most recent office notes, previous remotes.    Assessment and Plan:  1.  Chronic systolic dysfunction s/p St. Jude CRT-D  [facility] 03/2019 showed no significant CAD and low filling pressures with preserved CO. euvolemic today Stable on an appropriate medical regimen Normal ICD function See Pace Art report No changes today PFTs relatively normal 04/20/19. No significant obstruction. Cardiac rehab referal placed   2. 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  She will be OK per NCDMV guidelines to drive [id number]  3. Afib Well controlled currently She is willing to try [place], and is prescribed Eliquis to start once the "infection in her mouth" clears up. CHA2DS2VASC of at least 6    4. PVCs Previously discussed (and refused) PVC ablation BiV pacing 93% Could consider amiodarone if needed in the future to suppress PVCs  5. Snoring Refuses sleep study  6. Tooth abscess Multiple per patient. She is on ABX and plans for extraction. Plans to start Eliquis as above once healed from extraction.   Current medicines are reviewed at length with the patient today.   The patient does not have concerns regarding her medicines.  The following changes were made today:  none  Labs/ tests ordered today include:  Orders Placed This Encounter  Procedures  . CUP PACEART INCLINIC DEVICE CHECK    Disposition:   Follow up with EP APP in 6 months for continued device follow up.   Jacalyn Lefevre, PA-C  05/17/2019 12:12 PM  [place] [address] [phone] (office) [phone] (fax)

## 2019-05-17 ENCOUNTER — Other Ambulatory Visit: Payer: Self-pay

## 2019-05-17 ENCOUNTER — Encounter: Payer: Self-pay | Admitting: Student

## 2019-05-17 ENCOUNTER — Ambulatory Visit (INDEPENDENT_AMBULATORY_CARE_PROVIDER_SITE_OTHER): Payer: Medicare Other | Admitting: Student

## 2019-05-17 VITALS — BP 100/62 | HR 68 | Ht 62.5 in | Wt 184.0 lb

## 2019-05-17 DIAGNOSIS — I428 Other cardiomyopathies: Secondary | ICD-10-CM

## 2019-05-17 DIAGNOSIS — R0602 Shortness of breath: Secondary | ICD-10-CM | POA: Diagnosis not present

## 2019-05-17 DIAGNOSIS — I5022 Chronic systolic (congestive) heart failure: Secondary | ICD-10-CM

## 2019-05-17 DIAGNOSIS — I493 Ventricular premature depolarization: Secondary | ICD-10-CM

## 2019-05-17 DIAGNOSIS — I480 Paroxysmal atrial fibrillation: Secondary | ICD-10-CM

## 2019-05-17 LAB — CUP PACEART INCLINIC DEVICE CHECK
Battery Remaining Longevity: 42 mo
Brady Statistic RA Percent Paced: 84 %
Brady Statistic RV Percent Paced: 93 %
Date Time Interrogation Session: 20210426115950
HighPow Impedance: 73.125
Implantable Lead Implant Date: 20110912
Implantable Lead Implant Date: 20110912
Implantable Lead Implant Date: 20120710
Implantable Lead Location: 753858
Implantable Lead Location: 753859
Implantable Lead Location: 753860
Implantable Lead Model: 7122
Implantable Pulse Generator Implant Date: 20180621
Lead Channel Impedance Value: 337.5 Ohm
Lead Channel Impedance Value: 487.5 Ohm
Lead Channel Impedance Value: 612.5 Ohm
Lead Channel Pacing Threshold Amplitude: 0.5 V
Lead Channel Pacing Threshold Amplitude: 0.5 V
Lead Channel Pacing Threshold Amplitude: 0.625 V
Lead Channel Pacing Threshold Amplitude: 0.75 V
Lead Channel Pacing Threshold Amplitude: 0.75 V
Lead Channel Pacing Threshold Pulse Width: 0.5 ms
Lead Channel Pacing Threshold Pulse Width: 0.5 ms
Lead Channel Pacing Threshold Pulse Width: 0.5 ms
Lead Channel Pacing Threshold Pulse Width: 0.5 ms
Lead Channel Pacing Threshold Pulse Width: 0.5 ms
Lead Channel Sensing Intrinsic Amplitude: 12 mV
Lead Channel Sensing Intrinsic Amplitude: 3.5 mV
Lead Channel Setting Pacing Amplitude: 2 V
Lead Channel Setting Pacing Amplitude: 2 V
Lead Channel Setting Pacing Amplitude: 2 V
Lead Channel Setting Pacing Pulse Width: 0.5 ms
Lead Channel Setting Pacing Pulse Width: 0.5 ms
Lead Channel Setting Sensing Sensitivity: 0.5 mV
Pulse Gen Serial Number: 9761374

## 2019-05-17 NOTE — Patient Instructions (Addendum)
Medication Instructions:  none *If you need a refill on your cardiac medications before your next appointment, please call your pharmacy*   Lab Work: none If you have labs (blood work) drawn today and your tests are completely normal, you will receive your results only by: Marland Kitchen MyChart Message (if you have MyChart) OR . A paper copy in the mail If you have any lab test that is abnormal or we need to change your treatment, we will call you to review the results.   Testing/Procedures: none   Follow-Up: At [facility], you and your health needs are our priority.  As part of our continuing mission to provide you with exceptional heart care, we have created designated Provider Care Teams.  These Care Teams include your primary Cardiologist (physician) and Advanced Practice Providers (APPs -  Physician Assistants and Nurse Practitioners) who all work together to provide you with the care you need, when you need it.  We recommend signing up for the patient portal called "MyChart".  Sign up information is provided on this After Visit Summary.  MyChart is used to connect with patients for Virtual Visits (Telemedicine).  Patients are able to view lab/test results, encounter notes, upcoming appointments, etc.  Non-urgent messages can be sent to your provider as well.   To learn more about what you can do with MyChart, go to [email or web address].    Your next appointment:   6 months  The format for your next appointment:   Either In Person or Virtual  Provider:   [name], PA   Other Instructions Remote monitoring is used to monitor your ICD from home. This monitoring reduces the number of office visits required to check your device to one time per year. It allows [place] to keep an eye on the functioning of your device to ensure it is working properly. You are scheduled for a device check from home on 07/19/19. You may send your transmission at any time that day. If you have a wireless  device, the transmission will be sent automatically. After your physician reviews your transmission, you will receive a postcard with your next transmission date.

## 2019-05-25 ENCOUNTER — Other Ambulatory Visit: Payer: Self-pay | Admitting: Cardiology

## 2019-05-25 ENCOUNTER — Other Ambulatory Visit: Payer: Self-pay | Admitting: Family

## 2019-05-25 ENCOUNTER — Ambulatory Visit (INDEPENDENT_AMBULATORY_CARE_PROVIDER_SITE_OTHER): Payer: Medicare Other

## 2019-05-25 DIAGNOSIS — I5022 Chronic systolic (congestive) heart failure: Secondary | ICD-10-CM

## 2019-05-25 DIAGNOSIS — Z95810 Presence of automatic (implantable) cardiac defibrillator: Secondary | ICD-10-CM

## 2019-05-26 ENCOUNTER — Telehealth: Payer: Self-pay

## 2019-05-26 NOTE — Telephone Encounter (Signed)
Remote ICM transmission received.  Attempted call to patient regarding ICM remote transmission and daughter stated she was not home.

## 2019-05-26 NOTE — Progress Notes (Signed)
EPIC Encounter for ICM Monitoring  Patient Name: Melissa Paul is a 74 y.o. female Date: 05/26/2019 Primary Care Physican: Sharion Balloon, FNP Primary Cardiologist:[place] Electrophysiologist: Allred Bi-V Pacing:94% 05/17/2019 OfficeWeight:184lbs  AT/AF Burden: 2.5%   Attempted call to patient and unable to reach.   Transmission reviewed.   CorVueThoracic impedancenormal.  Prescribed:  Furosemide80 mgTake 1 tablet (80 mg total) by mouth daily. Additional 80 mg as needed for extra fluid  Spirolactone 25 mg take 1 tablet daily  Eliquis 5 mg take 1 tablet twice a day  Labs: 02/25/2019 Creatinine 1.11, BUN 13. Potassium 3.9, Sodium 137 02/08/2019 Creatinine 0.94, BUN 10, Potassium 3.1, Sodium 136, GFR 59->60 12/21/2018 Creatinine 0.94, BUN 13, Potassium 3.5, Sodium 136 12/03/2018 Creatinine 1.04, BUN 13, Potassium 3.8, Sodium 138, GFR 53-61  Recommendations:Unable to reach.    Follow-up plan: ICM clinic phone appointment on6/07/2019. 91 day device clinic remote transmission 07/19/2019. Office appt 06/11/2019 withDr Aundra Dubin and 08/23/2019 with Dr Domenic Polite.  Copy of ICM check sent to Dr.Allred.   3 month ICM trend: 05/25/2019    1 Year ICM trend:       [name], RN 05/26/2019 9:32 AM

## 2019-06-11 ENCOUNTER — Encounter (HOSPITAL_COMMUNITY): Payer: Medicare Other | Admitting: Cardiology

## 2019-06-18 ENCOUNTER — Ambulatory Visit (INDEPENDENT_AMBULATORY_CARE_PROVIDER_SITE_OTHER): Payer: Medicare Other | Admitting: Family

## 2019-06-18 ENCOUNTER — Other Ambulatory Visit: Payer: Self-pay

## 2019-06-18 ENCOUNTER — Encounter: Payer: Self-pay | Admitting: Family

## 2019-06-18 VITALS — BP 94/57 | HR 70 | Temp 97.7°F | Ht 62.5 in | Wt 184.0 lb

## 2019-06-18 DIAGNOSIS — G4700 Insomnia, unspecified: Secondary | ICD-10-CM | POA: Diagnosis not present

## 2019-06-18 DIAGNOSIS — F411 Generalized anxiety disorder: Secondary | ICD-10-CM

## 2019-06-18 DIAGNOSIS — I959 Hypotension, unspecified: Secondary | ICD-10-CM | POA: Diagnosis not present

## 2019-06-18 MED ORDER — HYDROXYZINE PAMOATE 25 MG PO CAPS: 25.0000 mg | ORAL_CAPSULE | Freq: Three times a day (TID) | ORAL | 2 refills | Status: DC | PRN

## 2019-06-18 NOTE — Progress Notes (Signed)
Subjective:    Patient ID: Melissa Paul, female    DOB: 08-Sep-1945, 74 y.o.   MRN: [id number]  Chief Complaint  Patient presents with  . Anxiety    pt here today c/o anxiety and not being able to sleep at night   Pt presents to the office today with problems of increased GAD and insomnia. She reports she has tried Buspar, but states this caused her headaches.   Pt's BP is very low today and she states she has not started her Delene Loll and has not taken her "fluid pills" today because she knew she was traveling.  Anxiety Presents for follow-up visit. Symptoms include decreased concentration, depressed mood, excessive worry, insomnia, irritability, nervous/anxious behavior and restlessness. Symptoms occur most days. The severity of symptoms is moderate. The quality of sleep is good.        Review of Systems  Constitutional: Positive for irritability.  Neurological: Positive for weakness.  Psychiatric/Behavioral: Positive for decreased concentration. The patient is nervous/anxious and has insomnia.   All other systems reviewed and are negative.      Objective:   Physical Exam Vitals reviewed.  Constitutional:      General: She is not in acute distress.    Appearance: She is well-developed.  HENT:     Head: Normocephalic and atraumatic.  Eyes:     Pupils: Pupils are equal, round, and reactive to light.  Neck:     Thyroid: No thyromegaly.  Cardiovascular:     Rate and Rhythm: Normal rate and regular rhythm.     Heart sounds: Normal heart sounds. No murmur.  Pulmonary:     Effort: Pulmonary effort is normal. No respiratory distress.     Breath sounds: Normal breath sounds. No wheezing.  Abdominal:     General: Bowel sounds are normal. There is no distension.     Palpations: Abdomen is soft.     Tenderness: There is no abdominal tenderness.  Musculoskeletal:        General: No tenderness. Normal range of motion.     Cervical back: Normal range of motion and neck  supple.  Skin:    General: Skin is warm and dry.  Neurological:     Mental Status: She is alert and oriented to person, place, and time.     Cranial Nerves: No cranial nerve deficit.     Motor: Weakness present.     Deep Tendon Reflexes: Reflexes are normal and symmetric.     Comments: Using rolling walker  Psychiatric:        Behavior: Behavior normal.        Thought Content: Thought content normal.        Judgment: Judgment normal.       BP (!) 90/53   Pulse 69   Temp 97.7 F (36.5 C) (Temporal)   Ht 5' 2.5" (1.588 m)   Wt 184 lb (83.5 kg)   BMI 33.12 kg/m      Assessment & Plan:  Melissa Paul comes in today with chief complaint of Anxiety (pt here today c/o anxiety and not being able to sleep at night)   Diagnosis and orders addressed:  1. GAD (generalized anxiety disorder) Will start Vistaril today as needed We discussed we could not start Valium or xanax today Stress management  She does not wish to start SSRI today - hydrOXYzine (VISTARIL) 25 MG capsule; Take 1 capsule (25 mg total) by mouth 3 (three) times daily as needed.  Dispense: 90 capsule;  Refill: 2  2. Insomnia, unspecified type - hydrOXYzine (VISTARIL) 25 MG capsule; Take 1 capsule (25 mg total) by mouth 3 (three) times daily as needed.  Dispense: 90 capsule; Refill: 2  3. Hypotension, unspecified hypotension type Pt reports she never started her Delene Loll and has not taken her lasix today. I told her to call her Cardiologists and let them know what her BP is.    Evelina Dun, FNP

## 2019-06-18 NOTE — Patient Instructions (Signed)

## 2019-06-25 ENCOUNTER — Other Ambulatory Visit: Payer: Self-pay | Admitting: Family

## 2019-06-28 ENCOUNTER — Ambulatory Visit (INDEPENDENT_AMBULATORY_CARE_PROVIDER_SITE_OTHER): Payer: Medicare Other

## 2019-06-28 DIAGNOSIS — Z95810 Presence of automatic (implantable) cardiac defibrillator: Secondary | ICD-10-CM

## 2019-06-28 DIAGNOSIS — I5022 Chronic systolic (congestive) heart failure: Secondary | ICD-10-CM | POA: Diagnosis not present

## 2019-06-30 ENCOUNTER — Telehealth: Payer: Self-pay

## 2019-06-30 NOTE — Progress Notes (Signed)
EPIC Encounter for ICM Monitoring  Patient Name: Melissa Paul is a 74 y.o. female Date: 06/30/2019 Primary Care Physican: Sharion Balloon, FNP Primary Cardiologist:[facility] Electrophysiologist: Allred Bi-V Pacing:95% 05/17/2019 OfficeWeight:184lbs  AT/AF Burden: 2.9% (taking Eliquis)    Attempted call to patient and unable to reach.  Left detailed message per DPR regarding transmission. Transmission reviewed.   CorVueThoracic impedancesuggesting possible fluid accumulation starting 06/22/19 and returned to normal 06/29/19.  Prescribed:  Furosemide80 mgTake 1 tablet (80 mg total) by mouth daily. Additional 80 mg as needed for extra fluid  Spirolactone 25 mg take 1 tablet daily  Eliquis 5 mg take 1 tablet twice a day  Labs: 04/06/2019 Creatinine 1.01, BUN 11, Potassium 3.9, Sodium 134, GFR 54->60 03/26/2019 Creatinine 1.17, BUN 9,   Potassium 4.3, Sodium 135, GFR 46-53  03/19/2019 Creatinine 1.20, BUN 12, Potassium 3.4, Sodium 136, GFR 44-51  A complete set of results can be found in Results Review.  Recommendations: Left voice mail with ICM number and encouraged to call if experiencing any fluid symptoms.  Follow-up plan: ICM clinic phone appointment on7/12/2019. 91 day device clinic remote transmission6/28/2021. Office appt8/02/2019 with Dr Domenic Polite.  Copy of ICM check sent to Dr.Allred.  3 month ICM trend: 06/29/2019    1 Year ICM trend:       [name], RN 06/30/2019 11:06 AM

## 2019-06-30 NOTE — Telephone Encounter (Signed)
Remote ICM transmission received.  Attempted call to patient regarding ICM remote transmission and left detailed message per DPR.  Advised to return call for any fluid symptoms or questions. Next ICM remote transmission scheduled 08/02/2019.     

## 2019-07-16 ENCOUNTER — Other Ambulatory Visit: Payer: Self-pay

## 2019-07-16 ENCOUNTER — Ambulatory Visit (INDEPENDENT_AMBULATORY_CARE_PROVIDER_SITE_OTHER): Payer: Medicare Other | Admitting: Physician Assistant

## 2019-07-16 ENCOUNTER — Encounter: Payer: Self-pay | Admitting: Physician Assistant

## 2019-07-16 VITALS — BP 100/65 | HR 70 | Temp 97.6°F | Ht 62.5 in | Wt 187.2 lb

## 2019-07-16 DIAGNOSIS — R399 Unspecified symptoms and signs involving the genitourinary system: Secondary | ICD-10-CM

## 2019-07-16 LAB — URINALYSIS
Bilirubin, UA: NEGATIVE
Glucose, UA: NEGATIVE
Nitrite, UA: NEGATIVE
RBC, UA: NEGATIVE
Specific Gravity, UA: 1.015 (ref 1.005–1.030)
Urobilinogen, Ur: 0.2 mg/dL (ref 0.2–1.0)
pH, UA: 6 (ref 5.0–7.5)

## 2019-07-16 MED ORDER — CEFUROXIME AXETIL 250 MG PO TABS: 250.0000 mg | ORAL_TABLET | Freq: Two times a day (BID) | ORAL | 0 refills | Status: DC

## 2019-07-16 NOTE — Progress Notes (Signed)
  Subjective:     Patient ID: Melissa Paul, female   DOB: 27-Nov-1945, 74 y.o.   MRN: [id number]  HPI  Pt with several day hx of general abd pain with dysuria Has also noted some hematuria on occasion Last UTI ~ 2-3 months ago States BS have been nl for her Review of Systems  Constitutional: Negative.   Gastrointestinal: Positive for abdominal pain. Negative for constipation and diarrhea.  Genitourinary: Positive for decreased urine volume, dysuria, frequency and hematuria. Negative for difficulty urinating, enuresis, flank pain, pelvic pain and urgency.       Objective:   Physical Exam Vitals and nursing note reviewed.  Constitutional:      General: She is not in acute distress.    Appearance: Normal appearance. She is not ill-appearing or toxic-appearing.  Abdominal:     General: There is no distension.     Palpations: Abdomen is soft. There is no mass.     Tenderness: There is abdominal tenderness. There is no right CVA tenderness, left CVA tenderness, guarding or rebound.     Comments: Generalized TTP worse in suprapubic region  Neurological:     Mental Status: She is alert.   Urine dip 1+ leuko, nitrate neg Unable to have micro in office today     Assessment:     1. UTI symptoms        Plan:     Pt with extensive list of med allergies Has been able to tolerate Ceftin prev so rx done today Hydrate well F/U with reg provider regarding chronic conditions

## 2019-07-16 NOTE — Patient Instructions (Signed)
Urinary Tract Infection, Adult A urinary tract infection (UTI) is an infection of any part of the urinary tract. The urinary tract includes:  The kidneys.  The ureters.  The bladder.  The urethra. These organs make, store, and get rid of pee (urine) in the body. What are the causes? This is caused by germs (bacteria) in your genital area. These germs grow and cause swelling (inflammation) of your urinary tract. What increases the risk? You are more likely to develop this condition if:  You have a small, thin tube (catheter) to drain pee.  You cannot control when you pee or poop (incontinence).  You are female, and: ? You use these methods to prevent pregnancy:  A medicine that kills sperm (spermicide).  A device that blocks sperm (diaphragm). ? You have low levels of a female hormone (estrogen). ? You are pregnant.  You have genes that add to your risk.  You are sexually active.  You take antibiotic medicines.  You have trouble peeing because of: ? A prostate that is bigger than normal, if you are female. ? A blockage in the part of your body that drains pee from the bladder (urethra). ? A kidney stone. ? A nerve condition that affects your bladder (neurogenic bladder). ? Not getting enough to drink. ? Not peeing often enough.  You have other conditions, such as: ? Diabetes. ? A weak disease-fighting system (immune system). ? Sickle cell disease. ? Gout. ? Injury of the spine. What are the signs or symptoms? Symptoms of this condition include:  Needing to pee right away (urgently).  Peeing often.  Peeing small amounts often.  Pain or burning when peeing.  Blood in the pee.  Pee that smells bad or not like normal.  Trouble peeing.  Pee that is cloudy.  Fluid coming from the vagina, if you are female.  Pain in the belly or lower back. Other symptoms include:  Throwing up (vomiting).  No urge to eat.  Feeling mixed up (confused).  Being tired  and grouchy (irritable).  A fever.  Watery poop (diarrhea). How is this treated? This condition may be treated with:  Antibiotic medicine.  Other medicines.  Drinking enough water. Follow these instructions at home:  Medicines  Take over-the-counter and prescription medicines only as told by your doctor.  If you were prescribed an antibiotic medicine, take it as told by your doctor. Do not stop taking it even if you start to feel better. General instructions  Make sure you: ? Pee until your bladder is empty. ? Do not hold pee for a long time. ? Empty your bladder after sex. ? Wipe from front to back after pooping if you are a female. Use each tissue one time when you wipe.  Drink enough fluid to keep your pee pale yellow.  Keep all follow-up visits as told by your doctor. This is important. Contact a doctor if:  You do not get better after 1-2 days.  Your symptoms go away and then come back. Get help right away if:  You have very bad back pain.  You have very bad pain in your lower belly.  You have a fever.  You are sick to your stomach (nauseous).  You are throwing up. Summary  A urinary tract infection (UTI) is an infection of any part of the urinary tract.  This condition is caused by germs in your genital area.  There are many risk factors for a UTI. These include having a small, thin   tube to drain pee and not being able to control when you pee or poop.  Treatment includes antibiotic medicines for germs.  Drink enough fluid to keep your pee pale yellow. This information is not intended to replace advice given to you by your health care provider. Make sure you discuss any questions you have with your health care provider. Document Revised: 12/25/2017 Document Reviewed: 07/17/2017 Elsevier Patient Education  2020 [employer] Inc.  

## 2019-07-19 ENCOUNTER — Ambulatory Visit (INDEPENDENT_AMBULATORY_CARE_PROVIDER_SITE_OTHER): Payer: Medicare Other | Admitting: *Deleted

## 2019-07-19 DIAGNOSIS — I5022 Chronic systolic (congestive) heart failure: Secondary | ICD-10-CM

## 2019-07-19 DIAGNOSIS — I428 Other cardiomyopathies: Secondary | ICD-10-CM

## 2019-07-20 LAB — CUP PACEART REMOTE DEVICE CHECK
Battery Remaining Longevity: 41 mo
Battery Remaining Percentage: 53 %
Battery Voltage: 2.93 V
Brady Statistic AP VP Percent: 92 %
Brady Statistic AP VS Percent: 1.1 %
Brady Statistic AS VP Percent: 3.2 %
Brady Statistic AS VS Percent: 1 %
Brady Statistic RA Percent Paced: 88 %
Date Time Interrogation Session: 20210628094611
HighPow Impedance: 78 Ohm
HighPow Impedance: 78 Ohm
Implantable Lead Implant Date: 20110912
Implantable Lead Implant Date: 20110912
Implantable Lead Implant Date: 20120710
Implantable Lead Location: 753858
Implantable Lead Location: 753859
Implantable Lead Location: 753860
Implantable Lead Model: 7122
Implantable Pulse Generator Implant Date: 20180621
Lead Channel Impedance Value: 330 Ohm
Lead Channel Impedance Value: 460 Ohm
Lead Channel Impedance Value: 560 Ohm
Lead Channel Pacing Threshold Amplitude: 0.5 V
Lead Channel Pacing Threshold Amplitude: 0.75 V
Lead Channel Pacing Threshold Amplitude: 0.75 V
Lead Channel Pacing Threshold Pulse Width: 0.5 ms
Lead Channel Pacing Threshold Pulse Width: 0.5 ms
Lead Channel Pacing Threshold Pulse Width: 0.5 ms
Lead Channel Sensing Intrinsic Amplitude: 12 mV
Lead Channel Sensing Intrinsic Amplitude: 3.9 mV
Lead Channel Setting Pacing Amplitude: 2 V
Lead Channel Setting Pacing Amplitude: 2 V
Lead Channel Setting Pacing Amplitude: 2 V
Lead Channel Setting Pacing Pulse Width: 0.5 ms
Lead Channel Setting Pacing Pulse Width: 0.5 ms
Lead Channel Setting Sensing Sensitivity: 0.5 mV
Pulse Gen Serial Number: 9761374

## 2019-07-20 LAB — URINE CULTURE

## 2019-07-21 NOTE — Progress Notes (Signed)
Remote ICD transmission.   

## 2019-07-26 ENCOUNTER — Other Ambulatory Visit (INDEPENDENT_AMBULATORY_CARE_PROVIDER_SITE_OTHER): Payer: Self-pay | Admitting: Internal Medicine

## 2019-07-26 ENCOUNTER — Other Ambulatory Visit: Payer: Self-pay | Admitting: Family

## 2019-08-02 ENCOUNTER — Ambulatory Visit (INDEPENDENT_AMBULATORY_CARE_PROVIDER_SITE_OTHER): Payer: Medicare Other

## 2019-08-02 DIAGNOSIS — I5022 Chronic systolic (congestive) heart failure: Secondary | ICD-10-CM | POA: Diagnosis not present

## 2019-08-02 DIAGNOSIS — Z95810 Presence of automatic (implantable) cardiac defibrillator: Secondary | ICD-10-CM | POA: Diagnosis not present

## 2019-08-03 ENCOUNTER — Telehealth: Payer: Self-pay

## 2019-08-03 NOTE — Telephone Encounter (Signed)
Remote ICM transmission received.  Attempted call to patient regarding ICM remote transmission and left detailed message per DPR.  Advised to return call for any fluid symptoms or questions. Next ICM remote transmission scheduled 09/06/2019.

## 2019-08-03 NOTE — Progress Notes (Signed)
EPIC Encounter for ICM Monitoring  Patient Name: Melissa Paul is a 74 y.o. female Date: 08/03/2019 Primary Care Physican: Sharion Balloon, FNP Primary Cardiologist:[facility] Electrophysiologist: Allred Bi-V Pacing:95% 07/16/2019 OfficeWeight:187lbs  AT/AF Burden:2.6% (taking Eliquis)    Attempted call to patient and unable to reach.  Left detailed message per DPR regarding transmission. Transmission reviewed.   CorVueThoracic impedancesuggesting possible fluid accumulation 07/28/19 - 08/01/2019 and returned to normal 08/02/19.  Prescribed:  Furosemide80 mgTake 1 tablet (80 mg total) by mouth daily. Additional 80 mg as needed for extra fluid  Spirolactone 25 mg take 1 tablet daily  Eliquis 5 mg take 1 tablet twice a day  Labs: 04/06/2019 Creatinine 1.01, BUN 11, Potassium 3.9, Sodium 134, GFR 54->60 03/26/2019 Creatinine 1.17, BUN 9,   Potassium 4.3, Sodium 135, GFR 46-53  03/19/2019 Creatinine 1.20, BUN 12, Potassium 3.4, Sodium 136, GFR 44-51  A complete set of results can be found in Results Review.  Recommendations: Left voice mail with ICM number and encouraged to call if experiencing any fluid symptoms.  Follow-up plan: ICM clinic phone appointment on8/16/2021. 91 day device clinic remote transmission9/27/2021.   EP/Cardiology Office Visits: 08/23/2019 with Dr. Domenic Polite.    Copy of ICM check sent to Dr. Joylene Grapes.   3 month ICM trend: 08/02/2019    1 Year ICM trend:       [name], RN 08/03/2019 10:38 AM

## 2019-08-04 ENCOUNTER — Ambulatory Visit (INDEPENDENT_AMBULATORY_CARE_PROVIDER_SITE_OTHER): Payer: Medicare Other | Admitting: Pulmonary Disease

## 2019-08-04 ENCOUNTER — Other Ambulatory Visit: Payer: Self-pay

## 2019-08-04 ENCOUNTER — Encounter: Payer: Self-pay | Admitting: Pulmonary Disease

## 2019-08-04 VITALS — BP 98/60 | HR 69 | Temp 97.6°F | Ht 61.75 in | Wt 197.0 lb

## 2019-08-04 DIAGNOSIS — R0600 Dyspnea, unspecified: Secondary | ICD-10-CM

## 2019-08-04 DIAGNOSIS — R0609 Other forms of dyspnea: Secondary | ICD-10-CM

## 2019-08-04 NOTE — Patient Instructions (Signed)
Referral to cardiac rehab program OK to use albuterol nebs as needed for wheezing

## 2019-08-04 NOTE — Progress Notes (Signed)
Subjective:    Patient ID: Melissa Paul, female    DOB: 06-30-1945, 74 y.o.   MRN: [id number]  HPI  Chief Complaint  Patient presents with  . Pulmonary Consult    Referred by Dr. Loralie Champagne. Pt c/o SOB since 2011- worse over the past few months. She also c/o non prod cough.    74 year old remote smoker presents for evaluation of persistent dyspnea in spite of optimization of heart failure therapy  PMH - HFrEF, nonischemic cardiomyopathy ,?  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  Rectal bleeding  She reports NYHA class III dyspnea on routine activities.  She denies wheezing or recurrent chest colds.  She uses albuterol nebs 2-3 times daily but this has not provided significant relief. Heart failure therapy has been optimized but I note that she is not on ACE/ARB for some reason.  She has been considered for anticoagulation but is hesitant to start Eliquis because she does have some rectal bleeding She reports some pedal edema but is compliant with Lasix  Last labs were reviewed, hemoglobin is 13, creatinine 1.01.  Chest x-ray 01/2019 shows mild hyperinflation, no infiltrates or effusions.  CT abdomen 12/2018 was reviewed which shows clear lung fields.   Significant tests/ events reviewed  PFTs 03/2019 mild airway obstruction, ratio 68, FEV1 1.66/84%, FVC 2.45/93%, DLCO 11.25/61%  Echo 08/2018 EF 25 to 30%, RVSP normal  LHC 03/2019-normal filling pressures, no significant CAD  Past Medical History:  Diagnosis Date  . Anxiety   . Chronic systolic heart failure ([place])   . Constipation   . Cyst of right kidney 11/12/2017  . Glucose intolerance (pre-diabetes)   . Hyperlipemia   . Hypothyroidism   . LBBB (left bundle branch block)   . Mitral regurgitation    Mild to Moderate  . Nonischemic cardiomyopathy ([place])    LVEF 35-40% by echo 8/13  .  Paroxysmal atrial fibrillation ([facility])    Brief episodes by device interrogation  . UTI (urinary tract infection)    Recurrent  . Ventricular tachycardia ([place])    ICD therapy for VT    Past Surgical History:  Procedure Laterality Date  . APPENDECTOMY    . BIV ICD GENERATOR CHANGEOUT N/A 07/11/2016   Procedure: BiV ICD Generator Changeout;  Surgeon: Thompson Grayer, MD;  Location: [facility];  Service: Cardiovascular;  Laterality: N/A;  . CARDIAC DEFIBRILLATOR PLACEMENT  09/2009   St. Jude BiV ICD by Dr. Sandria Manly class III  . CHOLECYSTECTOMY    . has one ovary left    . KNEE ARTHROSCOPY     Left  . LEFT AND RIGHT HEART CATHETERIZATION WITH CORONARY ANGIOGRAM N/A 04/12/2014   Procedure: LEFT AND RIGHT HEART CATHETERIZATION WITH CORONARY ANGIOGRAM;  Surgeon: Leonie Man, MD;  [facility];  Service: Cardiovascular;  Laterality: N/A;  . Left breast biopsy x 2     no maliganancy  . RIGHT/LEFT HEART CATH AND CORONARY ANGIOGRAPHY N/A 03/26/2019   Procedure: RIGHT/LEFT HEART CATH AND CORONARY ANGIOGRAPHY;  Surgeon: [name], MD;  Location: [facility];  Service: Cardiovascular;  Laterality: N/A;  . TOTAL ABDOMINAL HYSTERECTOMY     (R) ovary removed   Allergies  Allergen Reactions  . Buspar [Buspirone] Other (See Comments)    Patient states that she became hyper and had lots of itching.  Loma Sousa [Escitalopram Oxalate] Other (See Comments)  Patient states that she became hyper and lots of itching  . Bactrim Rash  . Cephalexin Rash    Says she is able to take cefuroxime  . Chlorhexidine Gluconate     Hives, itching after using wipes with last procedure  . Clindamycin Rash       . Contrast Media [Iodinated Diagnostic Agents] Rash  . Doxycycline Rash  . Latex Rash       . Levofloxacin Rash       . Nitrofurantoin Monohyd Macro Rash       . Penicillins Swelling and Rash    Has patient had a PCN reaction causing immediate rash,  facial/tongue/throat swelling, SOB or lightheadedness with hypotension: Yes Has patient had a PCN reaction causing severe rash involving mucus membranes or skin necrosis: Yes Has patient had a PCN reaction that required hospitalization: No Has patient had a PCN reaction occurring within the last 10 years: No Rash/swelled tongue If all of the above answers are "NO", then may proceed with Cephalosporin use.     Social History   Socioeconomic History  . Marital status: Widowed    Spouse name: Not on file  . Number of children: Not on file  . Years of education: Not on file  . Highest education level: Not on file  Occupational History  . Not on file  Tobacco Use  . Smoking status: Former Smoker    Packs/day: 0.30    Years: 20.00    Pack years: 6.00    Types: Cigarettes    Start date: 08/03/1949    Quit date: 01/22/1996    Years since quitting: 23.5  . Smokeless tobacco: Never Used  Vaping Use  . Vaping Use: Never used  Substance and Sexual Activity  . Alcohol use: No    Alcohol/week: 0.0 standard drinks  . Drug use: No  . Sexual activity: Not on file  Other Topics Concern  . Not on file  Social History Narrative  . Not on file   Social Determinants of Health   Financial Resource Strain:   . Difficulty of Paying Living Expenses:   Food Insecurity:   . Worried About Charity fundraiser in the Last Year:   . Arboriculturist in the Last Year:   Transportation Needs:   . Film/video editor (Medical):   Marland Kitchen Lack of Transportation (Non-Medical):   Physical Activity:   . Days of Exercise per Week:   . Minutes of Exercise per Session:   Stress:   . Feeling of Stress :   Social Connections:   . Frequency of Communication with Friends and Family:   . Frequency of Social Gatherings with Friends and Family:   . Attends Religious Services:   . Active Member of Clubs or Organizations:   . Attends Archivist Meetings:   Marland Kitchen Marital Status:   Intimate Partner  Violence:   . Fear of Current or Ex-Partner:   . Emotionally Abused:   Marland Kitchen Physically Abused:   . Sexually Abused:      Family History  Problem Relation Age of Onset  . CVA Mother        multiple  . Heart Problems Mother        "weak heart"  . Heart disease Maternal Grandmother   . Heart disease Maternal Grandfather   . Cancer Father        Stomach and eshpogus  . Diabetes Neg Hx   . Hypertension Neg Hx   .  Coronary artery disease Neg Hx       Review of Systems Shortness of breath with activity, nonproductive cough Indigestion Abdominal pain Difficulty swallowing Throat problems Nasal congestion Sneezing Anxiety Feet swelling Joint stiffness    Objective:   Physical Exam  Gen. Pleasant, obese, in no distress, normal affect ENT - no pallor,icterus, no post nasal drip, class 2-3 airway Neck: No JVD, no thyromegaly, no carotid bruits Lungs: no use of accessory muscles, no dullness to percussion, decreased without rales or rhonchi  Cardiovascular: Rhythm regular, heart sounds  normal, no murmurs or gallops, 1+ peripheral edema Abdomen: soft and non-tender, no hepatosplenomegaly, BS normal. Musculoskeletal: No deformities, no cyanosis or clubbing Neuro:  alert, non focal, no tremors       Assessment & Plan:

## 2019-08-04 NOTE — Assessment & Plan Note (Signed)
Heart failure therapy seems to have been optimized but she continues to have significant shortness of breath.  She is not anemic.  Her history of smoking is not impressive, less than 10 pack years before she quit in 1995. PFTs show very mild airway obstruction and FEV1 is maintained at 84%.  As such I feel the risk of bronchodilator toxicity is higher than benefit, she has used albuterol nebs with mild relief and will continue, I would have a low threshold to stop this if she develops tachyarrhythmias. She does not seem to have significant pulmonary hypertension.  There is no evidence of pulmonary fibrosis Her main issue may be deconditioning and I have encouraged her to start on cardiac rehab.  I will make the referral assuming that her cardiologist are in agreement

## 2019-08-11 ENCOUNTER — Telehealth: Payer: Self-pay | Admitting: *Deleted

## 2019-08-11 ENCOUNTER — Telehealth (INDEPENDENT_AMBULATORY_CARE_PROVIDER_SITE_OTHER): Payer: Medicare Other | Admitting: Cardiology

## 2019-08-11 ENCOUNTER — Encounter: Payer: Self-pay | Admitting: Cardiology

## 2019-08-11 ENCOUNTER — Ambulatory Visit (INDEPENDENT_AMBULATORY_CARE_PROVIDER_SITE_OTHER): Payer: Medicare Other

## 2019-08-11 ENCOUNTER — Telehealth: Payer: Self-pay | Admitting: Cardiology

## 2019-08-11 VITALS — BP 96/66 | HR 70 | Ht 62.5 in | Wt 187.0 lb

## 2019-08-11 DIAGNOSIS — I428 Other cardiomyopathies: Secondary | ICD-10-CM

## 2019-08-11 DIAGNOSIS — I480 Paroxysmal atrial fibrillation: Secondary | ICD-10-CM

## 2019-08-11 LAB — ECHOCARDIOGRAM COMPLETE
Area-P 1/2: 2.62 cm2
Calc EF: 38.1 %
MV M vel: 5.23 m/s
MV Peak grad: 109.6 mmHg
P 1/2 time: 1468 msec
S' Lateral: 5.63 cm
Single Plane A2C EF: 41.5 %
Single Plane A4C EF: 35.7 %

## 2019-08-11 NOTE — Patient Instructions (Addendum)
Medication Instructions:   Your physician recommends that you continue on your current medications as directed. Please refer to the Current Medication list given to you today.  Labwork:  NONE  Testing/Procedures:  NONE  Follow-Up:  Your physician recommends that you schedule a follow-up appointment in: 4 months (office).  Any Other Special Instructions Will Be Listed Below (If Applicable).  If you need a refill on your cardiac medications before your next appointment, please call your pharmacy. 

## 2019-08-11 NOTE — Telephone Encounter (Signed)
  Patient Consent for Virtual Visit         Melissa Paul has provided verbal consent on 08/11/2019 for a virtual visit (video or telephone).   CONSENT FOR VIRTUAL VISIT FOR:  Melissa Paul  By participating in this virtual visit I agree to the following:  I hereby voluntarily request, consent and authorize [facility] and its employed or contracted physicians, physician assistants, nurse practitioners or other licensed health care professionals (the Practitioner), to provide me with telemedicine health care services (the "Services") as deemed necessary by the treating Practitioner. I acknowledge and consent to receive the Services by the Practitioner via telemedicine. I understand that the telemedicine visit will involve communicating with the Practitioner through live audiovisual communication technology and the disclosure of certain medical information by electronic transmission. I acknowledge that I have been given the opportunity to request an in-person assessment or other available alternative prior to the telemedicine visit and am voluntarily participating in the telemedicine visit.  I understand that I have the right to withhold or withdraw my consent to the use of telemedicine in the course of my care at any time, without affecting my right to future care or treatment, and that the Practitioner or I may terminate the telemedicine visit at any time. I understand that I have the right to inspect all information obtained and/or recorded in the course of the telemedicine visit and may receive copies of available information for a reasonable fee.  I understand that some of the potential risks of receiving the Services via telemedicine include:  Marland Kitchen Delay or interruption in medical evaluation due to technological equipment failure or disruption; . Information transmitted may not be sufficient (e.g. poor resolution of images) to allow for appropriate medical decision making by the  Practitioner; and/or  . In rare instances, security protocols could fail, causing a breach of personal health information.  Furthermore, I acknowledge that it is my responsibility to provide information about my medical history, conditions and care that is complete and accurate to the best of my ability. I acknowledge that Practitioner's advice, recommendations, and/or decision may be based on factors not within their control, such as incomplete or inaccurate data provided by me or distortions of diagnostic images or specimens that may result from electronic transmissions. I understand that the practice of medicine is not an exact science and that Practitioner makes no warranties or guarantees regarding treatment outcomes. I acknowledge that a copy of this consent can be made available to me via my patient portal ([facility]), or I can request a printed copy by calling the office of [facility].    I understand that my insurance will be billed for this visit.   I have read or had this consent read to me. . I understand the contents of this consent, which adequately explains the benefits and risks of the Services being provided via telemedicine.  . I have been provided ample opportunity to ask questions regarding this consent and the Services and have had my questions answered to my satisfaction. . I give my informed consent for the services to be provided through the use of telemedicine in my medical care

## 2019-08-11 NOTE — Telephone Encounter (Signed)
-----   Message from Satira Sark, MD sent at 08/11/2019 12:42 PM EDT ----- Results reviewed.  LVEF approximately 35% with mild diastolic dysfunction.  Mild to moderate mitral regurgitation.  Continue with current medications and follow-up plan.

## 2019-08-11 NOTE — Progress Notes (Signed)
Virtual Visit via Telephone Note   This visit type was conducted due to national recommendations for restrictions regarding the COVID-19 Pandemic (e.g. social distancing) in an effort to limit this patient's exposure and mitigate transmission in our community.  Due to her co-morbid illnesses, this patient is at least at moderate risk for complications without adequate follow up.  This format is felt to be most appropriate for this patient at this time.  The patient did not have access to video technology/had technical difficulties with video requiring transitioning to audio format only (telephone).  All issues noted in this document were discussed and addressed.  No physical exam could be performed with this format.  Please refer to the patient's chart for her  consent to telehealth for [facility].   The patient was identified using 2 identifiers.  Date:  08/11/2019   ID:  Melissa Paul, DOB [birth year], MRN [id number]  Patient Location: Home Provider Location: Office/Clinic  PCP:  Sharion Balloon, FNP  Cardiologist:  Rozann Lesches, MD  Electrophysiologist:  Thompson Grayer, MD   Evaluation Performed:  Follow-Up Visit  Chief Complaint:  Cardiac follow-up  History of Present Illness:    Melissa Paul is a 74 y.o. female seen in April.  We spoke by phone today.  She complains of chronic weakness and shortness of breath.  She has undergone a fairly extensive cardiac evaluation within the last 6 months as detailed below.  She also had pulmonary assessment just recently, no major adjustments made in medical therapy.  She is hesitant to consider cardiac/pulmonary rehabilitation at this time.  I reviewed her medications which are outlined below and stable from a cardiac perspective.  Weight has been stable.  She sees Dr. [name], [employer] biventricular ICD in place.  Recent device check indicated 2.6% AT/AF burden, transient fluid accumulation with return to normal by July 12 per  thoracic impedance.  She does state that weight fluctuates at times and she adjusts her diuretics accordingly.  Follow-up echocardiogram done earlier today is outlined below.   Past Medical History:  Diagnosis Date  . Anxiety   . Chronic systolic heart failure ([place])   . Constipation   . Cyst of right kidney 11/12/2017  . Glucose intolerance (pre-diabetes)   . Hyperlipemia   . Hypothyroidism   . LBBB (left bundle branch block)   . Mitral regurgitation    Mild to Moderate  . Nonischemic cardiomyopathy ([place])    LVEF 35-40% by echo 8/13  . Paroxysmal atrial fibrillation ([facility])    Brief episodes by device interrogation  . UTI (urinary tract infection)    Recurrent  . Ventricular tachycardia ([place])    ICD therapy for VT   Past Surgical History:  Procedure Laterality Date  . APPENDECTOMY    . BIV ICD GENERATOR CHANGEOUT N/A 07/11/2016   Procedure: BiV ICD Generator Changeout;  Surgeon: Thompson Grayer, MD;  Location: [facility];  Service: Cardiovascular;  Laterality: N/A;  . CARDIAC DEFIBRILLATOR PLACEMENT  09/2009   St. Jude BiV ICD by Dr. Sandria Manly class III  . CHOLECYSTECTOMY    . has one ovary left    . KNEE ARTHROSCOPY     Left  . LEFT AND RIGHT HEART CATHETERIZATION WITH CORONARY ANGIOGRAM N/A 04/12/2014   Procedure: LEFT AND RIGHT HEART CATHETERIZATION WITH CORONARY ANGIOGRAM;  Surgeon: Leonie Man, MD;  Location: [facility];  Service: Cardiovascular;  Laterality: N/A;  . Left breast biopsy x 2  no maliganancy  . RIGHT/LEFT HEART CATH AND CORONARY ANGIOGRAPHY N/A 03/26/2019   Procedure: RIGHT/LEFT HEART CATH AND CORONARY ANGIOGRAPHY;  Surgeon: [name], MD;  Location: [facility];  Service: Cardiovascular;  Laterality: N/A;  . TOTAL ABDOMINAL HYSTERECTOMY     (R) ovary removed     Current Meds  Medication Sig  . acetaminophen (TYLENOL) 500 MG tablet Take 1,000 mg by mouth every 6 (six) hours as needed for headache.   . albuterol (PROVENTIL)  (2.5 MG/3ML) 0.083% nebulizer solution Take 3 mLs (2.5 mg total) by nebulization every 6 (six) hours as needed for wheezing or shortness of breath.  . carvedilol (COREG) 6.25 MG tablet Take 3 tablets (18.75 mg total) by mouth 2 (two) times daily.  . Digoxin 62.5 MCG TABS Take by mouth daily. 1/2 tab daily  . diphenhydrAMINE (BENADRYL) 50 MG tablet Take 50 mg by mouth at bedtime as needed for itching.  . furosemide (LASIX) 80 MG tablet Take 1 tablet (80 mg total) by mouth daily. Additional 80 mg as needed for extra fluid  . hydrOXYzine (VISTARIL) 25 MG capsule Take 1 capsule (25 mg total) by mouth 3 (three) times daily as needed.  Marland Kitchen levothyroxine (EUTHYROX) 88 MCG tablet Take 1 tablet (88 mcg total) by mouth daily before breakfast.  . metFORMIN (GLUCOPHAGE) 500 MG tablet Take 500 mg by mouth 2 (two) times daily.  . pantoprazole (PROTONIX) 40 MG tablet TAKE 1 TABLET BY MOUTH ONCE DAILY BEFORE BREAKFAST  . spironolactone (ALDACTONE) 25 MG tablet Take 1 tablet (25 mg total) by mouth at bedtime.     Allergies:   Buspar [buspirone], Lexapro [escitalopram oxalate], Bactrim, Cephalexin, Chlorhexidine gluconate, Clindamycin, Contrast media [iodinated diagnostic agents], Doxycycline, Latex, Levofloxacin, Nitrofurantoin monohyd macro, and Penicillins   ROS:   No palpitations or syncope.  Prior CV studies:   The following studies were reviewed today:  Cardiac catheterization 03/26/2019: RA mean 1 RV 26/3 PA 20/5, mean 11 PCWP mean 9 LV 100/7 AO 106/46  Oxygen saturations: PA 78% AO 99%  Cardiac Output (Fick) 6.92  Cardiac Index (Fick) 3.7  1. Normal filling pressures.  2. Preserved cardiac output.  3. No significant coronary disease noted.   Exertional symptoms not explained by this study.   Echocardiogram 08/11/2019: 1. Left ventricular ejection fraction, by estimation, is approximately  35%. The left ventricle demonstrates regional wall motion abnormalities  (see scoring  diagram/findings for description). The left ventricular  internal cavity size was moderately  dilated. Left ventricular diastolic parameters are consistent with Grade I  diastolic dysfunction (impaired relaxation).  2. Right ventricular systolic function is normal. The right ventricular  size is normal. A device wire is visualized. There is normal pulmonary  artery systolic pressure. The estimated right ventricular systolic  pressure is 56.3 mmHg.  3. Left atrial size was mildly dilated.  4. The mitral valve is abnormal, mildly thickened and calcified. Mild to  moderate mitral valve regurgitation.  5. The aortic valve is tricuspid. Aortic valve regurgitation is trivial.  6. The inferior vena cava is normal in size with greater than 50%  respiratory variability, suggesting right atrial pressure of 3 mmHg.   Labs/Other Tests and Data Reviewed:    EKG:  An ECG dated 03/26/2019 was personally reviewed today and demonstrated:  Dual-chamber paced rhythm.  Recent Labs: 10/19/2018: ALT 8; TSH 8.680 02/08/2019: Magnesium 1.7 04/06/2019: BUN 11; Creatinine, Ser 1.01; Hemoglobin 13.1; Platelets 295; Potassium 3.9; Sodium 134   Recent Lipid Panel Lab Results  Component Value Date/Time   CHOL 156 10/19/2018 11:33 AM   TRIG 89 10/19/2018 11:33 AM   HDL 45 10/19/2018 11:33 AM   CHOLHDL 3.5 10/19/2018 11:33 AM   CHOLHDL 3.3 04/11/2007 06:00 AM   LDLCALC 94 10/19/2018 11:33 AM    Wt Readings from Last 3 Encounters:  08/11/19 187 lb (84.8 kg)  08/04/19 197 lb (89.4 kg)  07/16/19 187 lb 3.2 oz (84.9 kg)     Objective:    Vital Signs:  BP 96/66 (BP Location: Right Wrist) Comment: home BP reading  Pulse 70   Ht 5' 2.5" (1.588 m)   Wt 187 lb (84.8 kg)   BMI 33.66 kg/m    Patient spoke in full sentences, not short of breath on the phone.  ASSESSMENT & PLAN:    1.  Nonischemic cardiomyopathy, follow-up echocardiogram today reveals LVEF 35%.  Plan is to continue medical therapy and  observation.  Recent thoracic impedance normal after transient fluid accumulation.  She is on Coreg, Lasix, and Aldactone.  Low blood pressure limits further medication additions.  2.  Biventricular ICD in place with follow-up by Dr. [name].  No device shocks or syncope.  3.  Paroxysmal atrial fibrillation with low rhythm burden.  She had been on Eliquis, but stopped it and is going back on aspirin daily.  We discussed the risk benefit profile in terms of stroke prophylaxis, recommending return to Eliquis and she states that she will think about this further and let us know.   Time:   Today, I have spent 8 minutes with the patient with telehealth technology discussing the above problems.     Medication Adjustments/Labs and Tests Ordered: Current medicines are reviewed at length with the patient today.  Concerns regarding medicines are outlined above.   Tests Ordered: No orders of the defined types were placed in this encounter.   Medication Changes: No orders of the defined types were placed in this encounter.   Follow Up:  In Person 4 months in the [place] office.  Signed, Rozann Lesches, MD  08/11/2019 4:48 PM    [place]

## 2019-08-11 NOTE — Telephone Encounter (Signed)
Patient informed. Copy sent to PCP °

## 2019-08-13 ENCOUNTER — Telehealth: Payer: Self-pay | Admitting: Cardiology

## 2019-08-13 DIAGNOSIS — Z79899 Other long term (current) drug therapy: Secondary | ICD-10-CM

## 2019-08-13 DIAGNOSIS — I480 Paroxysmal atrial fibrillation: Secondary | ICD-10-CM

## 2019-08-13 MED ORDER — APIXABAN 5 MG PO TABS
5.0000 mg | ORAL_TABLET | Freq: Two times a day (BID) | ORAL | 6 refills | Status: DC
Start: 2019-08-13 — End: 2020-08-18

## 2019-08-13 NOTE — Telephone Encounter (Signed)
Thank you for letting me know that she is now back on Eliquis 5 mg twice daily.  I saw her lab work from March.  She needs a CBC and BMET for her follow-up visit

## 2019-08-13 NOTE — Telephone Encounter (Signed)
Patient called stating that she was told to contact office when she started taking Eliquis 5 mg. She took Her first dose at 1000am 08/13/2019.

## 2019-08-13 NOTE — Telephone Encounter (Signed)
Patient informed and verbalized understanding of plan. Lab orders faxed to [facility] and [facility]

## 2019-08-17 NOTE — H&P (Signed)
Surgical History & Physical  Patient Name: Melissa Paul DOB: 20-Feb-1945  Surgery: Cataract extraction with intraocular lens implant phacoemulsification; Left Eye  Surgeon: Baruch Goldmann MD Surgery Date:  08/27/2019 Pre-Op Date:  07/29/2019  HPI: A 73 Yr. old female patient 1. 1. The patient complains of difficulty when reading fine print, books, newspaper, instructions etc., which began many years ago. Both eyes are affected. The episode is constant. The condition is worse with intensive visual activity (reading, computer). The complaint is associated with blurry vision. This is negatively affecting the patient's quality of life. 2. The patient complains of difficulty when seeing street signs, which began many years ago. Both eyes are affected. Symptoms occur when the patient is outside. HPI Completed by Dr. Baruch Goldmann  Medical History: Dry Eyes Cataracts Acid reflux Diabetes - DM Type 2 Heart Problem Lung Problems Thyroid Problems  Review of Systems Cardiovascular High Blood Pressure, Pacemaker/defibrillator Psychiatry Anxiety All recorded systems are negative except as noted above.  Social   Former smoker of Cigarettes   Medication Artificial tears,  Acetaminophinen, Albuterol, Carvedilol, Digoxin, Diphenhydramine HCl, Furosemide, Hydroxyzine, Levothyroxine Sodium, Metformin, Pantoprazole, Apixaban, Sacubitril-valsartan, Spironolactone,   Sx/Procedures Hysterectomy, Cholecystectomy, Appendectomy, Oophercetomy, Pacemaker and Defibrillator,   Drug Allergies  Buspar, Lexapro, BACTRIM, CEPHALEXIN, Chlorhexidine, Clindamycin, Contrast media, Doxycycline, Latex, Levofloxacin, Nitrofurantoin macrocrystal, Pencillin, Bee stings,   History & Physical: Heent:  Cataract, Left eye NECK: supple without bruits LUNGS: lungs clear to auscultation CV: regular rate and rhythm Abdomen: soft and non-tender  Impression & Plan: Assessment: 1.  COMBINED FORMS AGE RELATED CATARACT;  Both Eyes (H25.813) 2.  Diabetes Type 2 No retinopathy (E11.9) 3.  BLEPHARITIS; Right Upper Lid, Right Lower Lid, Left Upper Lid, Left Lower Lid (H01.001, H01.002,H01.004,H01.005) 4.  ARCUS SENILIS; Both Eyes (H18.413)  Plan: 1.  Cataract accounts for the patient's decreased vision. This visual impairment is not correctable with a tolerable change in glasses or contact lenses. Cataract surgery with an implantation of a new lens should significantly improve the visual and functional status of the patient. Discussed all risks, benefits, alternatives, and potential complications. Discussed the procedures and recovery. Patient desires to have surgery. A-scan ordered and performed today for intra-ocular lens calculations. The surgery will be performed in order to improve vision for driving, reading, and for eye examinations. Recommend phacoemulsification with intra-ocular lens. Left Eye non-dominant - first. Dilates well - shugarcaine by protocol. 2.  Stressed importance of blood sugar and blood pressure control, and also yearly eye examinations. Discussed the need for ongoing proactive ocular exams and treatment, hopefully before visual symptoms develop. 3.  Recommend regular lid cleaning. 4.  Discussed significance of finding

## 2019-08-19 ENCOUNTER — Ambulatory Visit (INDEPENDENT_AMBULATORY_CARE_PROVIDER_SITE_OTHER): Payer: Medicare Other | Admitting: Family

## 2019-08-19 ENCOUNTER — Other Ambulatory Visit: Payer: Self-pay

## 2019-08-19 ENCOUNTER — Encounter: Payer: Self-pay | Admitting: Family

## 2019-08-19 VITALS — BP 96/55 | HR 66 | Temp 95.0°F | Ht 62.5 in | Wt 187.6 lb

## 2019-08-19 DIAGNOSIS — K047 Periapical abscess without sinus: Secondary | ICD-10-CM

## 2019-08-19 MED ORDER — CEFTRIAXONE SODIUM 1 G IJ SOLR
1.0000 g | Freq: Once | INTRAMUSCULAR | Status: AC
Start: 2019-08-19 — End: 2019-08-19
  Administered 2019-08-19: 1 g via INTRAMUSCULAR

## 2019-08-19 MED ORDER — CLINDAMYCIN HCL 300 MG PO CAPS: 300.0000 mg | ORAL_CAPSULE | Freq: Three times a day (TID) | ORAL | 0 refills | Status: DC

## 2019-08-19 NOTE — Progress Notes (Signed)
Subjective:    Patient ID: Melissa Paul, female    DOB: [birth year], 74 y.o.   MRN: [id number]  Chief Complaint  Patient presents with   Oral Swelling    pain, patient thinks abcess. She does not have dentisit     HPI Pt presents to the office today with right upper and lower gum pain that started about 5 days ago. She states the pain is worsening. She has a broken tooth on her right upper jaw and states if she touches that tooth she has shooting pain of 10 out 10.   She has been rising with vinegar and hot water with no relief.   She does not have a dentist and has called to try to make an appointment, but it was going to be $500-600. She states she does not have that type of money.   Review of Systems  All other systems reviewed and are negative.      Objective:   Physical Exam Vitals reviewed.  Constitutional:      General: She is not in acute distress.    Appearance: She is well-developed.  HENT:     Head: Normocephalic and atraumatic.     Mouth/Throat:     Dentition: Abnormal dentition. Dental tenderness, dental caries and dental abscesses present.      Comments: Tooth broken and swollen and tenderness present Eyes:     Pupils: Pupils are equal, round, and reactive to light.  Neck:     Thyroid: No thyromegaly.  Cardiovascular:     Rate and Rhythm: Normal rate and regular rhythm.     Heart sounds: Normal heart sounds. No murmur heard.   Pulmonary:     Effort: Pulmonary effort is normal. No respiratory distress.     Breath sounds: Normal breath sounds. No wheezing.  Abdominal:     General: Bowel sounds are normal. There is no distension.     Palpations: Abdomen is soft.     Tenderness: There is no abdominal tenderness.  Musculoskeletal:        General: No tenderness. Normal range of motion.     Cervical back: Normal range of motion and neck supple.  Skin:    General: Skin is warm and dry.  Neurological:     Mental Status: She is alert and oriented to  person, place, and time.     Cranial Nerves: No cranial nerve deficit.     Deep Tendon Reflexes: Reflexes are normal and symmetric.  Psychiatric:        Behavior: Behavior normal.        Thought Content: Thought content normal.        Judgment: Judgment normal.     BP (!) 96/55    Pulse 66    Temp (!) 95 F (35 C) (Temporal)    Ht 5' 2.5" (1.588 m)    Wt 187 lb 9.6 oz (85.1 kg)    SpO2 97%    BMI 33.77 kg/m        Assessment & Plan:  Melissa Paul comes in today with chief complaint of Oral Swelling (pain, patient thinks abcess. She does not have dentisit )   Diagnosis and orders addressed:  1. Abscessed tooth Pt has several medications on allergy list, I do not believe these are true allergens. We will test Clindamycin today. Discussed if she does have a rash or a true allergic reaction we may need to change medication.  Discussed she needs to follow up with  dentists to have tooth removed Rinse mouth after eating  - cefTRIAXone (ROCEPHIN) injection 1 g - clindamycin (CLEOCIN) 300 MG capsule; Take 1 capsule (300 mg total) by mouth 3 (three) times daily.  Dispense: 30 capsule; Refill: 0 - Ambulatory referral to Dentistry   Evelina Dun, FNP

## 2019-08-19 NOTE — Patient Instructions (Signed)
Dental Abscess  A dental abscess is a collection of pus in or around a tooth that results from an infection. An abscess can cause pain in the affected area as well as other symptoms. Treatment is important to help with symptoms and to prevent the infection from spreading. What are the causes? This condition is caused by a bacterial infection around the root of the tooth that involves the inner part of the tooth (pulp). It may result from:  Severe tooth decay.  Trauma to the tooth, such as a broken or chipped tooth, that allows bacteria to enter into the pulp.  Severe gum disease around a tooth. What increases the risk? This condition is more likely to develop in males. It is also more likely to develop in people who:  Have dental decay (cavities).  Eat sugary snacks between meals.  Use tobacco products.  Have diabetes.  Have a weakened disease-fighting system (immune system).  Do not brush and care for their teeth regularly. What are the signs or symptoms? Symptoms of this condition include:  Severe pain in and around the infected tooth.  Swelling and redness around the infected tooth, in the mouth, or in the face.  Tenderness.  Pus drainage.  Bad breath.  Bitter taste in the mouth.  Difficulty swallowing.  Difficulty opening the mouth.  Nausea.  Vomiting.  Chills.  Swollen neck glands.  Fever. How is this diagnosed? This condition is diagnosed based on:  Your symptoms and your medical and dental history.  An examination of the infected tooth. During the exam, your dentist may tap on the infected tooth. You may also have X-rays of the affected area. How is this treated? This condition is treated by getting rid of the infection. This may be done with:  Incision and drainage. This procedure is done by making an incision in the abscess to drain out the pus. Removing pus is the first priority in treating an abscess.  Antibiotic medicines. These may be used  in certain situations.  Antibacterial mouth rinse.  A root canal. This may be performed to save the tooth. Your dentist accesses the visible part of your tooth (crown) with a drill and removes any damaged pulp. Then the space is filled and sealed off.  Tooth extraction. The tooth is pulled out if it cannot be saved by other treatment. You may also receive treatment for pain, such as:  Acetaminophen or NSAIDs.  Gels that contain a numbing medicine.  An injection to block the pain near your nerve. Follow these instructions at home: Medicines  Take over-the-counter and prescription medicines only as told by your dentist.  If you were prescribed an antibiotic, take it as told by your dentist. Do not stop taking the antibiotic even if you start to feel better.  If you were prescribed a gel that contains a numbing medicine, use it exactly as told in the directions. Do not use these gels for children who are younger than 2 years of age.  Do not drive or use heavy machinery while taking prescription pain medicine. General instructions  Rinse out your mouth often with salt water to relieve pain or swelling. To make a salt-water mixture, completely dissolve -1 tsp of salt in 1 cup of warm water.  Eat a soft diet while your abscess is healing.  Drink enough fluid to keep your urine pale yellow.  Do not apply heat to the outside of your mouth.  Do not use any products that contain nicotine or   tobacco, such as cigarettes and e-cigarettes. If you need help quitting, ask your health care provider.  Keep all follow-up visits as told by your dentist. This is important. How is this prevented?  Brush your teeth every morning and night with fluoride toothpaste. Floss one time each day.  Get regularly scheduled dental cleanings.  Consider having a dental sealant applied on teeth that have deep holes (caries).  Drink fluoridated water regularly. This includes most tap water. Check the label  on bottled water to see if it contains fluoride.  Drink water instead of sugary drinks.  Eat healthy meals and snacks.  Wear a mouth guard or face shield to protect your teeth while playing sports. Contact a health care provider if:  Your pain is worse and is not helped by medicine. Get help right away if:  You have a fever or chills.  Your symptoms suddenly get worse.  You have a very bad headache.  You have problems breathing or swallowing.  You have trouble opening your mouth.  You have swelling in your neck or around your eye. Summary  A dental abscess is a collection of pus in or around a tooth that results from an infection.  A dental abscess may result from severe tooth decay, trauma to the tooth, or severe gum disease around a tooth.  Symptoms include severe pain, swelling, redness, and drainage of pus in and around the infected tooth.  The first priority in treating a dental abscess is to drain out the pus. Treatment may also involve removing damage inside the tooth (root canal) or pulling out (extracting) the tooth. This information is not intended to replace advice given to you by your health care provider. Make sure you discuss any questions you have with your health care provider. Document Revised: 12/20/2016 Document Reviewed: 09/09/2016 Elsevier Patient Education  2020 Elsevier Inc.  

## 2019-08-20 ENCOUNTER — Telehealth: Payer: Self-pay | Admitting: Family

## 2019-08-23 ENCOUNTER — Ambulatory Visit: Payer: Medicare Other | Admitting: Cardiology

## 2019-08-23 NOTE — Patient Instructions (Signed)
FELICIDAD SUGARMAN  08/23/2019     @[email or web address] @   Your procedure is scheduled on  08/27/2019  Report to [facility] at  [email or web address].M.  Call this number if you have problems the morning of surgery:  [phone]   Remember:  Do not eat or drink after midnight.                       Take these medicines the morning of surgery with A SIP OF WATER  Carvedilol, digoxin, vistaril, levothyroxine, protonix.     Do not wear jewelry, make-up or nail polish.  Do not wear lotions, powders, or perfumes, or deodorant.  Do not shave 48 hours prior to surgery.  Men may shave face and neck.  Do not bring valuables to the hospital.  [facility] is not responsible for any belongings or valuables.  Contacts, dentures or bridgework may not be worn into surgery.  Leave your suitcase in the car.  After surgery it may be brought to your room.  For patients admitted to the hospital, discharge time will be determined by your treatment team.  Patients discharged the day of surgery will not be allowed to drive home.   Name and phone number of your driver:   family Special instructions:  DO NOT smoke the morning of your procedure.  Please read over the following fact sheets that you were given. Anesthesia Post-op Instructions and Care and Recovery After Surgery      Cataract Surgery, Care After This sheet gives you information about how to care for yourself after your procedure. Your health care provider may also give you more specific instructions. If you have problems or questions, contact your health care provider. What can I expect after the procedure? After the procedure, it is common to have:  Itching.  Discomfort.  Fluid discharge.  Sensitivity to light and to touch.  Bruising in or around the eye.  Mild blurred vision. Follow these instructions at home: Eye care   Do not touch or rub your eyes.  Protect your eyes as told by your health care provider. You may be told to  wear a protective eye shield or sunglasses.  Do not put a contact lens into the affected eye or eyes until your health care provider approves.  Keep the area around your eye clean and dry: ? Avoid swimming. ? Do not allow water to hit you directly in the face while showering. ? Keep soap and shampoo out of your eyes.  Check your eye every day for signs of infection. Watch for: ? Redness, swelling, or pain. ? Fluid, blood, or pus. ? Warmth. ? A bad smell. ? Vision that is getting worse. ? Sensitivity that is getting worse. Activity  Do not drive for 24 hours if you were given a sedative during your procedure.  Avoid strenuous activities, such as playing contact sports, for as long as told by your health care provider.  Do not drive or use heavy machinery until your health care provider approves.  Do not bend or lift heavy objects. Bending increases pressure in the eye. You can walk, climb stairs, and do light household chores.  Ask your health care provider when you can return to work. If you work in a dusty environment, you may be advised to wear protective eyewear for a period of time. General instructions  Take or apply over-the-counter and prescription medicines only as told by your health care  provider. This includes eye drops.  Keep all follow-up visits as told by your health care provider. This is important. Contact a health care provider if:  You have increased bruising around your eye.  You have pain that is not helped with medicine.  You have a fever.  You have redness, swelling, or pain in your eye.  You have fluid, blood, or pus coming from your incision.  Your vision gets worse.  Your sensitivity to light gets worse. Get help right away if:  You have sudden loss of vision.  You see flashes of light or spots (floaters).  You have severe eye pain.  You develop nausea or vomiting. Summary  After your procedure, it is common to have itching,  discomfort, bruising, fluid discharge, or sensitivity to light.  Follow instructions from your health care provider about caring for your eye after the procedure.  Do not rub your eye after the procedure. You may need to wear eye protection or sunglasses. Do not wear contact lenses. Keep the area around your eye clean and dry.  Avoid activities that require a lot of effort. These include playing sports and lifting heavy objects.  Contact a health care provider if you have increased bruising, pain that does not go away, or a fever. Get help right away if you suddenly lose your vision, see flashes of light or spots, or have severe pain in the eye. This information is not intended to replace advice given to you by your health care provider. Make sure you discuss any questions you have with your health care provider. Document Revised: 11/03/2018 Document Reviewed: 07/07/2017 Elsevier Patient Education  [place].

## 2019-08-24 ENCOUNTER — Encounter (HOSPITAL_COMMUNITY): Payer: Self-pay

## 2019-08-24 ENCOUNTER — Other Ambulatory Visit: Payer: Self-pay

## 2019-08-24 ENCOUNTER — Encounter (HOSPITAL_COMMUNITY)
Admission: RE | Admit: 2019-08-24 | Discharge: 2019-08-24 | Disposition: A | Discharge status: Home / Self Care | Payer: Medicare Other | Source: Ambulatory Visit | Attending: Ophthalmology | Admitting: Ophthalmology

## 2019-08-24 DIAGNOSIS — Z01812 Encounter for preprocedural laboratory examination: Secondary | ICD-10-CM | POA: Diagnosis not present

## 2019-08-24 DIAGNOSIS — Z20822 Contact with and (suspected) exposure to covid-19: Secondary | ICD-10-CM | POA: Insufficient documentation

## 2019-08-24 LAB — BASIC METABOLIC PANEL
Anion gap: 13 (ref 5–15)
BUN: 13 mg/dL (ref 8–23)
CO2: 27 mmol/L (ref 22–32)
Calcium: 8.8 mg/dL — ABNORMAL LOW (ref 8.9–10.3)
Chloride: 94 mmol/L — ABNORMAL LOW (ref 98–111)
Creatinine, Ser: 1.08 mg/dL — ABNORMAL HIGH (ref 0.44–1.00)
GFR calc Af Amer: 58 mL/min — ABNORMAL LOW (ref >60)
GFR calc non Af Amer: 50 mL/min — ABNORMAL LOW (ref >60)
Glucose, Bld: 297 mg/dL — ABNORMAL HIGH (ref 70–99)
Potassium: 3.6 mmol/L (ref 3.5–5.1)
Sodium: 134 mmol/L — ABNORMAL LOW (ref 135–145)

## 2019-08-24 LAB — HEMOGLOBIN A1C
Hgb A1c MFr Bld: 9.6 % — ABNORMAL HIGH (ref 4.8–5.6)
Mean Plasma Glucose: 228.82 mg/dL

## 2019-08-25 ENCOUNTER — Other Ambulatory Visit (HOSPITAL_COMMUNITY)
Admission: RE | Admit: 2019-08-25 | Discharge: 2019-08-25 | Disposition: A | Discharge status: Home / Self Care | Payer: Medicare Other | Source: Ambulatory Visit | Attending: Ophthalmology | Admitting: Ophthalmology

## 2019-08-25 DIAGNOSIS — Z20822 Contact with and (suspected) exposure to covid-19: Secondary | ICD-10-CM | POA: Diagnosis not present

## 2019-08-25 DIAGNOSIS — Z01812 Encounter for preprocedural laboratory examination: Secondary | ICD-10-CM | POA: Insufficient documentation

## 2019-08-25 LAB — SARS CORONAVIRUS 2 (TAT 6-24 HRS): SARS Coronavirus 2: NEGATIVE

## 2019-08-25 NOTE — Pre-Procedure Instructions (Signed)
HgbA1C routed to PCP. 

## 2019-08-27 ENCOUNTER — Ambulatory Visit (HOSPITAL_COMMUNITY): Payer: Medicare Other | Admitting: Anesthesiology

## 2019-08-27 ENCOUNTER — Ambulatory Visit (HOSPITAL_COMMUNITY)
Admission: RE | Admit: 2019-08-27 | Discharge: 2019-08-27 | Disposition: A | Discharge status: Home / Self Care | Payer: Medicare Other | Attending: Ophthalmology | Admitting: Ophthalmology

## 2019-08-27 ENCOUNTER — Encounter (HOSPITAL_COMMUNITY)
Admission: RE | Disposition: A | Discharge status: Home / Self Care | Payer: Self-pay | Source: Home / Self Care | Attending: Ophthalmology

## 2019-08-27 DIAGNOSIS — K219 Gastro-esophageal reflux disease without esophagitis: Secondary | ICD-10-CM | POA: Diagnosis not present

## 2019-08-27 DIAGNOSIS — Z888 Allergy status to other drugs, medicaments and biological substances status: Secondary | ICD-10-CM | POA: Diagnosis not present

## 2019-08-27 DIAGNOSIS — I4891 Unspecified atrial fibrillation: Secondary | ICD-10-CM | POA: Insufficient documentation

## 2019-08-27 DIAGNOSIS — Z881 Allergy status to other antibiotic agents status: Secondary | ICD-10-CM | POA: Diagnosis not present

## 2019-08-27 DIAGNOSIS — Z95810 Presence of automatic (implantable) cardiac defibrillator: Secondary | ICD-10-CM | POA: Diagnosis not present

## 2019-08-27 DIAGNOSIS — H0100A Unspecified blepharitis right eye, upper and lower eyelids: Secondary | ICD-10-CM | POA: Diagnosis not present

## 2019-08-27 DIAGNOSIS — Z87891 Personal history of nicotine dependence: Secondary | ICD-10-CM | POA: Diagnosis not present

## 2019-08-27 DIAGNOSIS — Z91041 Radiographic dye allergy status: Secondary | ICD-10-CM | POA: Insufficient documentation

## 2019-08-27 DIAGNOSIS — Z79890 Hormone replacement therapy (postmenopausal): Secondary | ICD-10-CM | POA: Diagnosis not present

## 2019-08-27 DIAGNOSIS — Z79899 Other long term (current) drug therapy: Secondary | ICD-10-CM | POA: Insufficient documentation

## 2019-08-27 DIAGNOSIS — Z7984 Long term (current) use of oral hypoglycemic drugs: Secondary | ICD-10-CM | POA: Insufficient documentation

## 2019-08-27 DIAGNOSIS — Z880 Allergy status to penicillin: Secondary | ICD-10-CM | POA: Diagnosis not present

## 2019-08-27 DIAGNOSIS — H0100B Unspecified blepharitis left eye, upper and lower eyelids: Secondary | ICD-10-CM | POA: Insufficient documentation

## 2019-08-27 DIAGNOSIS — H25812 Combined forms of age-related cataract, left eye: Secondary | ICD-10-CM | POA: Diagnosis not present

## 2019-08-27 DIAGNOSIS — E039 Hypothyroidism, unspecified: Secondary | ICD-10-CM | POA: Diagnosis not present

## 2019-08-27 DIAGNOSIS — Z7901 Long term (current) use of anticoagulants: Secondary | ICD-10-CM | POA: Insufficient documentation

## 2019-08-27 DIAGNOSIS — Z91030 Bee allergy status: Secondary | ICD-10-CM | POA: Insufficient documentation

## 2019-08-27 DIAGNOSIS — E1136 Type 2 diabetes mellitus with diabetic cataract: Secondary | ICD-10-CM | POA: Diagnosis present

## 2019-08-27 DIAGNOSIS — Z91040 Latex allergy status: Secondary | ICD-10-CM | POA: Diagnosis not present

## 2019-08-27 DIAGNOSIS — I509 Heart failure, unspecified: Secondary | ICD-10-CM | POA: Insufficient documentation

## 2019-08-27 DIAGNOSIS — H18413 Arcus senilis, bilateral: Secondary | ICD-10-CM | POA: Diagnosis not present

## 2019-08-27 LAB — GLUCOSE, CAPILLARY: Glucose-Capillary: 212 mg/dL — ABNORMAL HIGH (ref 70–99)

## 2019-08-27 SURGERY — CATARACT EXTRACTION PHACO AND INTRAOCULAR LENS PLACEMENT (IOC) with placement of Corticosteroid
Anesthesia: Monitor Anesthesia Care | Site: Eye | Laterality: Left

## 2019-08-27 MED ORDER — PROVISC 10 MG/ML IO SOLN
INTRAOCULAR | Status: DC | PRN
  Administered 2019-08-27: 0.85 mL via INTRAOCULAR

## 2019-08-27 MED ORDER — POVIDONE-IODINE 5 % OP SOLN
OPHTHALMIC | Status: DC | PRN
  Administered 2019-08-27: 1 via OPHTHALMIC

## 2019-08-27 MED ORDER — DEXAMETHASONE 0.4 MG OP INST
VAGINAL_INSERT | OPHTHALMIC | Status: DC | PRN
  Administered 2019-08-27: 0.4 mg via OPHTHALMIC

## 2019-08-27 MED ORDER — MIDAZOLAM HCL 2 MG/2ML IJ SOLN
INTRAMUSCULAR | Status: AC
  Filled 2019-08-27: qty 2

## 2019-08-27 MED ORDER — LIDOCAINE HCL 3.5 % OP GEL
1.0000 "application " | Freq: Once | OPHTHALMIC | Status: AC
  Administered 2019-08-27: 1 via OPHTHALMIC

## 2019-08-27 MED ORDER — CYCLOPENTOLATE-PHENYLEPHRINE 0.2-1 % OP SOLN
1.0000 [drp] | OPHTHALMIC | Status: AC | PRN
  Administered 2019-08-27 (×3): 1 [drp] via OPHTHALMIC

## 2019-08-27 MED ORDER — TETRACAINE HCL 0.5 % OP SOLN
1.0000 [drp] | OPHTHALMIC | Status: AC | PRN
  Administered 2019-08-27 (×3): 1 [drp] via OPHTHALMIC

## 2019-08-27 MED ORDER — BSS IO SOLN
INTRAOCULAR | Status: DC | PRN
  Administered 2019-08-27: 15 mL via INTRAOCULAR

## 2019-08-27 MED ORDER — SODIUM HYALURONATE 23 MG/ML IO SOLN
INTRAOCULAR | Status: DC | PRN
  Administered 2019-08-27: 0.6 mL via INTRAOCULAR

## 2019-08-27 MED ORDER — PHENYLEPHRINE HCL 2.5 % OP SOLN
1.0000 [drp] | OPHTHALMIC | Status: AC | PRN
  Administered 2019-08-27 (×3): 1 [drp] via OPHTHALMIC

## 2019-08-27 MED ORDER — LIDOCAINE HCL (PF) 1 % IJ SOLN
INTRAOCULAR | Status: DC | PRN
  Administered 2019-08-27: 1 mL via OPHTHALMIC

## 2019-08-27 MED ORDER — MIDAZOLAM HCL 5 MG/5ML IJ SOLN
INTRAMUSCULAR | Status: DC | PRN
  Administered 2019-08-27: 2 mg via INTRAVENOUS

## 2019-08-27 MED ORDER — LACTATED RINGERS IV SOLN: INTRAVENOUS | Status: DC

## 2019-08-27 MED ORDER — DEXAMETHASONE 0.4 MG OP INST
VAGINAL_INSERT | OPHTHALMIC | Status: AC
  Filled 2019-08-27: qty 1

## 2019-08-27 MED ORDER — EPINEPHRINE PF 1 MG/ML IJ SOLN
INTRAOCULAR | Status: DC | PRN
  Administered 2019-08-27: 500 mL

## 2019-08-27 SURGICAL SUPPLY — 13 items
CLOTH BEACON ORANGE TIMEOUT ST (SAFETY) ×3 IMPLANT
EYE SHIELD UNIVERSAL CLEAR (GAUZE/BANDAGES/DRESSINGS) ×3 IMPLANT
GLOVE BIOGEL PI IND STRL 7.0 (GLOVE) ×4 IMPLANT
GLOVE BIOGEL PI INDICATOR 7.0 (GLOVE) ×2
GLOVE SURG SS PI 7.5 STRL IVOR (GLOVE) ×3 IMPLANT
LENS ALC ACRYL/TECN (Ophthalmic Related) ×3 IMPLANT
NEEDLE HYPO 18GX1.5 BLUNT FILL (NEEDLE) ×3 IMPLANT
PAD ARMBOARD 7.5X6 YLW CONV (MISCELLANEOUS) ×3 IMPLANT
SYR TB 1ML LL NO SAFETY (SYRINGE) ×3 IMPLANT
TAPE SURG TRANSPORE 1 IN (GAUZE/BANDAGES/DRESSINGS) ×2 IMPLANT
TAPE SURGICAL TRANSPORE 1 IN (GAUZE/BANDAGES/DRESSINGS) ×3
VISCOELASTIC ADDITIONAL (OPHTHALMIC RELATED) ×3 IMPLANT
WATER STERILE IRR 250ML POUR (IV SOLUTION) ×3 IMPLANT

## 2019-08-27 NOTE — Discharge Instructions (Signed)
Please discharge patient when stable, will follow up today with Dr. Giuliano Preece at the [facility] [place] office immediately following discharge.  Leave shield in place until visit.  All paperwork with discharge instructions will be given at the office.  [facility] Address:  [address]  [place], [place] [address]             Monitored Anesthesia Care, Care After These instructions provide you with information about caring for yourself after your procedure. Your health care provider may also give you more specific instructions. Your treatment has been planned according to current medical practices, but problems sometimes occur. Call your health care provider if you have any problems or questions after your procedure. What can I expect after the procedure? After your procedure, you may:  Feel sleepy for several hours.  Feel clumsy and have poor balance for several hours.  Feel forgetful about what happened after the procedure.  Have poor judgment for several hours.  Feel nauseous or vomit.  Have a sore throat if you had a breathing tube during the procedure. Follow these instructions at home: For at least 24 hours after the procedure:      Have a responsible adult stay with you. It is important to have someone help care for you until you are awake and alert.  Rest as needed.  Do not: ? Participate in activities in which you could fall or become injured. ? Drive. ? Use heavy machinery. ? Drink alcohol. ? Take sleeping pills or medicines that cause drowsiness. ? Make important decisions or sign legal documents. ? Take care of children on your own. Eating and drinking  Follow the diet that is recommended by your health care provider.  If you vomit, drink water, juice, or soup when you can drink without vomiting.  Make sure you have little or no nausea before eating solid foods. General instructions  Take over-the-counter and  prescription medicines only as told by your health care provider.  If you have sleep apnea, surgery and certain medicines can increase your risk for breathing problems. Follow instructions from your health care provider about wearing your sleep device: ? Anytime you are sleeping, including during daytime naps. ? While taking prescription pain medicines, sleeping medicines, or medicines that make you drowsy.  If you smoke, do not smoke without supervision.  Keep all follow-up visits as told by your health care provider. This is important. Contact a health care provider if:  You keep feeling nauseous or you keep vomiting.  You feel light-headed.  You develop a rash.  You have a fever. Get help right away if:  You have trouble breathing. Summary  For several hours after your procedure, you may feel sleepy and have poor judgment.  Have a responsible adult stay with you for at least 24 hours or until you are awake and alert. This information is not intended to replace advice given to you by your health care provider. Make sure you discuss any questions you have with your health care provider. Document Revised: 04/07/2017 Document Reviewed: 04/30/2015 Elsevier Patient Education  2020 Elsevier Inc.  

## 2019-08-27 NOTE — Interval H&P Note (Signed)
History and Physical Interval Note:  08/27/2019 10:17 AM  Melissa Paul  has presented today for surgery, with the diagnosis of Nuclear sclerotic cataract - Left eye.  The various methods of treatment have been discussed with the patient and family. After consideration of risks, benefits and other options for treatment, the patient has consented to  Procedure(s) with comments: CATARACT EXTRACTION PHACO AND INTRAOCULAR LENS PLACEMENT ([place]) with placement of Corticosteroid (Left) - CDE:  as a surgical intervention.  The patient's history has been reviewed, patient examined, no change in status, stable for surgery.  I have reviewed the patient's chart and labs.  Questions were answered to the patient's satisfaction.     Baruch Goldmann

## 2019-08-27 NOTE — Anesthesia Postprocedure Evaluation (Signed)
Anesthesia Post Note  Patient: Melissa Paul  Procedure(s) Performed: CATARACT EXTRACTION PHACO AND INTRAOCULAR LENS PLACEMENT ([facility]) with placement of Corticosteroid (Left Eye)  Patient location during evaluation: Phase II Anesthesia Type: MAC Level of consciousness: awake, oriented, awake and alert and patient cooperative Pain management: pain level controlled Vital Signs Assessment: post-procedure vital signs reviewed and stable Respiratory status: spontaneous breathing, respiratory function stable and nonlabored ventilation Cardiovascular status: blood pressure returned to baseline and stable Postop Assessment: no headache and no backache Anesthetic complications: no   No complications documented.   Last Vitals:  Vitals:   08/27/19 0943  BP: 118/73  Resp: 16  Temp: 36.8 C  SpO2: 97%    Last Pain:  Vitals:   08/27/19 0943  TempSrc: Oral  PainSc: 0-No pain                 Tacy Learn

## 2019-08-27 NOTE — Anesthesia Preprocedure Evaluation (Signed)
Anesthesia Evaluation  Patient identified by MRN, date of birth, ID band Patient awake    Reviewed: Allergy & Precautions, NPO status , Patient's Chart, lab work & pertinent test results  History of Anesthesia Complications Negative for: history of anesthetic complications  Airway Mallampati: II  TM Distance: >3 FB Neck ROM: Full    Dental  (+) Missing, Poor Dentition, Dental Advisory Given   Pulmonary former smoker,           Cardiovascular +CHF  + dysrhythmias Atrial Fibrillation and Ventricular Tachycardia + Cardiac Defibrillator (delivered shock in 01/2019, had concussion) + Valvular Problems/Murmurs MR  Rhythm:Regular Rate:Normal  Results reviewed.  LVEF approximately 35% with mild diastolic dysfunction.  Mild to moderate mitral regurgitation.  Continue with current medications and follow-up plan   Neuro/Psych PSYCHIATRIC DISORDERS Anxiety    GI/Hepatic negative GI ROS,   Endo/Other  diabetes, Well Controlled, Type 2Hypothyroidism   Renal/GU Renal InsufficiencyRenal disease     Musculoskeletal   Abdominal   Peds  Hematology   Anesthesia Other Findings Dental abscess   Reproductive/Obstetrics                            Anesthesia Physical Anesthesia Plan  ASA: IV  Anesthesia Plan: MAC   Post-op Pain Management:    Induction:   PONV Risk Score and Plan:   Airway Management Planned: Nasal Cannula and Natural Airway  Additional Equipment:   Intra-op Plan:   Post-operative Plan:   Informed Consent:   Plan Discussed with:   Anesthesia Plan Comments:         Anesthesia Quick Evaluation

## 2019-08-27 NOTE — Op Note (Signed)
Date of procedure: 08/27/19  Pre-operative diagnosis: Visually significant age-related combined cataract, Left Eye (H25.812)  Post-operative diagnosis: Visually significant age-related combined cataract, Left Eye (H25.812)  Procedure:  1. Removal of cataract via phacoemulsification and insertion of intra-ocular lens Johnson and Floyd  +21.0D into the capsular bag of the Left Eye 2. Placement of Dextenza Implant, Left Lower Lid  Attending surgeon: Gerda Diss. Austine Wiedeman, MD, MA  Anesthesia: MAC, Topical [place]  Complications: None  Estimated Blood Loss: <65m (minimal)  Specimens: None  Implants: As above  Indications:  Visually significant age-related cataract, Left Eye  Procedure:  The patient was seen and identified in the pre-operative area. The operative eye was identified and dilated.  The operative eye was marked.  Topical anesthesia was administered to the operative eye.     The patient was then to the operative suite and placed in the supine position.  A timeout was performed confirming the patient, procedure to be performed, and all other relevant information.   The patient's face was prepped and draped in the usual fashion for intra-ocular surgery.  A lid speculum was placed into the operative eye and the surgical microscope moved into place and focused.  An inferotemporal paracentesis was created using a 20 gauge paracentesis blade.  Shugarcaine was injected into the anterior chamber.  Viscoelastic was injected into the anterior chamber.  A temporal clear-corneal main wound incision was created using a 2.439mmicrokeratome.  A continuous curvilinear capsulorrhexis was initiated using an irrigating cystitome and completed using capsulorrhexis forceps.  Hydrodissection and hydrodeliniation were performed.  Viscoelastic was injected into the anterior chamber.  A phacoemulsification handpiece and a chopper as a second instrument were used to remove the nucleus and epinucleus.  The irrigation/aspiration handpiece was used to remove any remaining cortical material.   The capsular bag was reinflated with viscoelastic, checked, and found to be intact.  The intraocular lens was inserted into the capsular bag.  The irrigation/aspiration handpiece was used to remove any remaining viscoelastic.  The clear corneal wound and paracentesis wounds were then hydrated and checked with Weck-Cels to be watertight.  The lid-speculum was removed.  A Dextenza implant was placed in the lower canaliculus. The drape was removed.  The patient's face was cleaned with a wet and dry 4x4.   A clear shield was taped over the eye. The patient was taken to the post-operative care unit in good condition, having tolerated the procedure well.  Post-Op Instructions: [facility] and will receive all other orders and instructions.

## 2019-08-27 NOTE — Transfer of Care (Signed)
Immediate Anesthesia Transfer of Care Note  Patient: Melissa Paul  Procedure(s) Performed: CATARACT EXTRACTION PHACO AND INTRAOCULAR LENS PLACEMENT ([facility]) with placement of Corticosteroid (Left Eye)  Patient Location: Short Stay  Anesthesia Type:MAC  Level of Consciousness: awake, alert , oriented and patient cooperative  Airway & Oxygen Therapy: Patient Spontanous Breathing  Post-op Assessment: Report given to RN, Post -op Vital signs reviewed and stable and Patient moving all extremities  Post vital signs: Reviewed and stable  Last Vitals:  Vitals Value Taken Time  BP    Temp    Pulse    Resp    SpO2      Last Pain:  Vitals:   08/27/19 0943  TempSrc: Oral  PainSc: 0-No pain      Patients Stated Pain Goal: 9 (67/12/45 8099)  Complications: No complications documented.

## 2019-08-29 LAB — HM DIABETES EYE EXAM

## 2019-09-06 ENCOUNTER — Ambulatory Visit (INDEPENDENT_AMBULATORY_CARE_PROVIDER_SITE_OTHER): Payer: Medicare Other

## 2019-09-06 DIAGNOSIS — I5022 Chronic systolic (congestive) heart failure: Secondary | ICD-10-CM

## 2019-09-06 DIAGNOSIS — Z95810 Presence of automatic (implantable) cardiac defibrillator: Secondary | ICD-10-CM | POA: Diagnosis not present

## 2019-09-06 NOTE — H&P (Signed)
Surgical History & Physical  Patient Name: Melissa Paul DOB: [birth year]  Surgery: Cataract extraction with intraocular lens implant phacoemulsification; Right Eye  Surgeon: Baruch Goldmann MD Surgery Date:  09/10/2019 Pre-Op Date:  09/06/2019  HPI: A 69 Yr. old female patient 1. 1. The patient complains of difficulty when reading fine print, books, newspaper, instructions etc., which began many years ago. The right eye is affected. The episode is gradual. The condition's severity increased since last visit. Symptoms occur when the patient is inside and reading. This is negatively affecting the patient's quality of life. 2. The patient is returning after cataract post-op. The left eye is affected. Status post cataract post-op, which began 9 days ago: Since the last visit, the affected area is doing well. The patient's vision is improved. Patient is following medication instructions. HPI was performed by Baruch Goldmann .  Medical History: Dry Eyes Cataracts Acid reflux Diabetes - DM Type 2 Heart Problem Lung Problems Thyroid Problems  Review of Systems Cardiovascular High Blood Pressure, Pacemaker/defibrillator Psychiatry Anxiety All recorded systems are negative except as noted above.  Social   Former smoker of Cigarettes   Medication Artificial tears, Prednisolone-gatiflox-bromfenac,  Acetaminophinen, Albuterol, Carvedilol, Digoxin, Diphenhydramine HCl, Furosemide, Hydroxyzine, Levothyroxine Sodium, Metformin, Pantoprazole, Apixaban, Sacubitril-valsartan, Spironolactone,   Sx/Procedures Phaco c IOL OS,  Hysterectomy, Cholecystectomy, Appendectomy, Oophercetomy, Pacemaker and Defibrillator,   Drug Allergies  Buspar, Lexapro, BACTRIM, CEPHALEXIN, Chlorhexidine, Clindamycin, Contrast media, Doxycycline, Latex, Levofloxacin, Nitrofurantoin macrocrystal, Pencillin, Bee stings,   History & Physical: Heent:  Cataract, Right eye NECK: supple without bruits LUNGS: lungs clear to  auscultation CV: regular rate and rhythm Abdomen: soft and non-tender  Impression & Plan: Assessment: 1.  COMBINED FORMS AGE RELATED CATARACT; Right Eye (H25.811) 2.  CATARACT EXTRACTION STATUS; Left Eye (Z98.42)  Plan: 1.  Dilates well - shugarcaine by protocol. Cataract accounts for the patient's decreased vision. This visual impairment is not correctable with a tolerable change in glasses or contact lenses. Cataract surgery with an implantation of a new lens should significantly improve the visual and functional status of the patient. Discussed all risks, benefits, alternatives, and potential complications. Discussed the procedures and recovery. Patient desires to have surgery. A-scan ordered and performed today for intra-ocular lens calculations. The surgery will be performed in order to improve vision for driving, reading, and for eye examinations. Recommend phacoemulsification with intra-ocular lens. Recommend Dextenza for post-operative pain and inflammation. Right Eye. Surgery required to correct imbalance of vision. 2.  1 week after cataract surgery. Doing well with improved vision and normal eye pressure. Call with any problems or concerns. Stop all drops - Dextenza.

## 2019-09-07 ENCOUNTER — Other Ambulatory Visit: Payer: Self-pay

## 2019-09-07 ENCOUNTER — Telehealth (HOSPITAL_COMMUNITY): Payer: Self-pay | Admitting: Cardiology

## 2019-09-07 ENCOUNTER — Encounter (HOSPITAL_COMMUNITY)
Admission: RE | Admit: 2019-09-07 | Discharge: 2019-09-07 | Disposition: A | Discharge status: Home / Self Care | Payer: Medicare Other | Source: Ambulatory Visit | Attending: Ophthalmology | Admitting: Ophthalmology

## 2019-09-07 ENCOUNTER — Telehealth (HOSPITAL_COMMUNITY): Payer: Self-pay | Admitting: Pharmacy Technician

## 2019-09-07 NOTE — Telephone Encounter (Signed)
The patient has no insurance, therefore cannot afford Eliquis. Started an application for [employer] patient assistance for her. The application will be at the front check in desk for her to sign.   Will follow up.

## 2019-09-07 NOTE — Telephone Encounter (Signed)
Pt daughter stated the medication Eliquis is costly and mom can't afford it, she is paying out of pocket, pt can be reached @ [phone]. Please advise

## 2019-09-08 ENCOUNTER — Other Ambulatory Visit: Payer: Self-pay

## 2019-09-08 ENCOUNTER — Other Ambulatory Visit (HOSPITAL_COMMUNITY)
Admission: RE | Admit: 2019-09-08 | Discharge: 2019-09-08 | Disposition: A | Discharge status: Home / Self Care | Payer: Medicare Other | Source: Ambulatory Visit | Attending: Ophthalmology | Admitting: Ophthalmology

## 2019-09-08 DIAGNOSIS — Z01812 Encounter for preprocedural laboratory examination: Secondary | ICD-10-CM | POA: Diagnosis present

## 2019-09-08 DIAGNOSIS — Z20822 Contact with and (suspected) exposure to covid-19: Secondary | ICD-10-CM | POA: Diagnosis not present

## 2019-09-08 LAB — SARS CORONAVIRUS 2 (TAT 6-24 HRS): SARS Coronavirus 2: NEGATIVE

## 2019-09-10 ENCOUNTER — Ambulatory Visit (HOSPITAL_COMMUNITY): Payer: Medicare Other | Admitting: Anesthesiology

## 2019-09-10 ENCOUNTER — Other Ambulatory Visit: Payer: Self-pay

## 2019-09-10 ENCOUNTER — Encounter (HOSPITAL_COMMUNITY)
Admission: RE | Disposition: A | Discharge status: Home / Self Care | Payer: Self-pay | Source: Home / Self Care | Attending: Ophthalmology

## 2019-09-10 ENCOUNTER — Ambulatory Visit (HOSPITAL_COMMUNITY)
Admission: RE | Admit: 2019-09-10 | Discharge: 2019-09-10 | Disposition: A | Discharge status: Home / Self Care | Payer: Medicare Other | Attending: Ophthalmology | Admitting: Ophthalmology

## 2019-09-10 ENCOUNTER — Encounter (HOSPITAL_COMMUNITY): Payer: Self-pay | Admitting: Ophthalmology

## 2019-09-10 DIAGNOSIS — Z961 Presence of intraocular lens: Secondary | ICD-10-CM | POA: Diagnosis not present

## 2019-09-10 DIAGNOSIS — Z7984 Long term (current) use of oral hypoglycemic drugs: Secondary | ICD-10-CM | POA: Diagnosis not present

## 2019-09-10 DIAGNOSIS — I5030 Unspecified diastolic (congestive) heart failure: Secondary | ICD-10-CM | POA: Diagnosis not present

## 2019-09-10 DIAGNOSIS — Z888 Allergy status to other drugs, medicaments and biological substances status: Secondary | ICD-10-CM | POA: Insufficient documentation

## 2019-09-10 DIAGNOSIS — H04123 Dry eye syndrome of bilateral lacrimal glands: Secondary | ICD-10-CM | POA: Diagnosis not present

## 2019-09-10 DIAGNOSIS — I110 Hypertensive heart disease with heart failure: Secondary | ICD-10-CM | POA: Insufficient documentation

## 2019-09-10 DIAGNOSIS — Z79890 Hormone replacement therapy (postmenopausal): Secondary | ICD-10-CM | POA: Diagnosis not present

## 2019-09-10 DIAGNOSIS — I4891 Unspecified atrial fibrillation: Secondary | ICD-10-CM | POA: Diagnosis not present

## 2019-09-10 DIAGNOSIS — Z7952 Long term (current) use of systemic steroids: Secondary | ICD-10-CM | POA: Diagnosis not present

## 2019-09-10 DIAGNOSIS — K219 Gastro-esophageal reflux disease without esophagitis: Secondary | ICD-10-CM | POA: Insufficient documentation

## 2019-09-10 DIAGNOSIS — H25811 Combined forms of age-related cataract, right eye: Secondary | ICD-10-CM | POA: Diagnosis not present

## 2019-09-10 DIAGNOSIS — Z7901 Long term (current) use of anticoagulants: Secondary | ICD-10-CM | POA: Insufficient documentation

## 2019-09-10 DIAGNOSIS — Z881 Allergy status to other antibiotic agents status: Secondary | ICD-10-CM | POA: Diagnosis not present

## 2019-09-10 DIAGNOSIS — Z95810 Presence of automatic (implantable) cardiac defibrillator: Secondary | ICD-10-CM | POA: Diagnosis not present

## 2019-09-10 DIAGNOSIS — Z880 Allergy status to penicillin: Secondary | ICD-10-CM | POA: Diagnosis not present

## 2019-09-10 DIAGNOSIS — Z9842 Cataract extraction status, left eye: Secondary | ICD-10-CM | POA: Diagnosis not present

## 2019-09-10 DIAGNOSIS — E1136 Type 2 diabetes mellitus with diabetic cataract: Secondary | ICD-10-CM | POA: Diagnosis not present

## 2019-09-10 DIAGNOSIS — Z87891 Personal history of nicotine dependence: Secondary | ICD-10-CM | POA: Diagnosis not present

## 2019-09-10 DIAGNOSIS — Z79899 Other long term (current) drug therapy: Secondary | ICD-10-CM | POA: Insufficient documentation

## 2019-09-10 LAB — GLUCOSE, CAPILLARY: Glucose-Capillary: 246 mg/dL — ABNORMAL HIGH (ref 70–99)

## 2019-09-10 SURGERY — CATARACT EXTRACTION PHACO AND INTRAOCULAR LENS PLACEMENT (IOC) with placement of Corticosteroid
Anesthesia: Monitor Anesthesia Care | Site: Eye | Laterality: Right

## 2019-09-10 MED ORDER — TETRACAINE HCL 0.5 % OP SOLN
1.0000 [drp] | OPHTHALMIC | Status: AC | PRN
  Administered 2019-09-10 (×3): 1 [drp] via OPHTHALMIC

## 2019-09-10 MED ORDER — EPINEPHRINE PF 1 MG/ML IJ SOLN
INTRAOCULAR | Status: DC | PRN
  Administered 2019-09-10: 500 mL

## 2019-09-10 MED ORDER — PROVISC 10 MG/ML IO SOLN
INTRAOCULAR | Status: DC | PRN
  Administered 2019-09-10: 0.85 mL via INTRAOCULAR

## 2019-09-10 MED ORDER — PHENYLEPHRINE HCL 2.5 % OP SOLN
1.0000 [drp] | OPHTHALMIC | Status: AC | PRN
  Administered 2019-09-10 (×3): 1 [drp] via OPHTHALMIC

## 2019-09-10 MED ORDER — DEXAMETHASONE 0.4 MG OP INST
VAGINAL_INSERT | OPHTHALMIC | Status: DC | PRN
  Administered 2019-09-10: 0.4 mg via OPHTHALMIC

## 2019-09-10 MED ORDER — LACTATED RINGERS IV SOLN: INTRAVENOUS | Status: DC

## 2019-09-10 MED ORDER — LIDOCAINE HCL 3.5 % OP GEL
1.0000 "application " | Freq: Once | OPHTHALMIC | Status: AC
  Administered 2019-09-10: 1 via OPHTHALMIC

## 2019-09-10 MED ORDER — CYCLOPENTOLATE-PHENYLEPHRINE 0.2-1 % OP SOLN
1.0000 [drp] | OPHTHALMIC | Status: AC | PRN
  Administered 2019-09-10 (×3): 1 [drp] via OPHTHALMIC

## 2019-09-10 MED ORDER — DEXAMETHASONE 0.4 MG OP INST
VAGINAL_INSERT | OPHTHALMIC | Status: AC
  Filled 2019-09-10: qty 1

## 2019-09-10 MED ORDER — MIDAZOLAM HCL 2 MG/2ML IJ SOLN
INTRAMUSCULAR | Status: DC | PRN
  Administered 2019-09-10: 2 mg via INTRAVENOUS

## 2019-09-10 MED ORDER — EPINEPHRINE PF 1 MG/ML IJ SOLN
INTRAMUSCULAR | Status: AC
  Filled 2019-09-10: qty 2

## 2019-09-10 MED ORDER — POVIDONE-IODINE 5 % OP SOLN
OPHTHALMIC | Status: DC | PRN
  Administered 2019-09-10: 1 via OPHTHALMIC

## 2019-09-10 MED ORDER — SODIUM HYALURONATE 23 MG/ML IO SOLN
INTRAOCULAR | Status: DC | PRN
  Administered 2019-09-10: 0.6 mL via INTRAOCULAR

## 2019-09-10 MED ORDER — LIDOCAINE HCL (PF) 1 % IJ SOLN
INTRAOCULAR | Status: DC | PRN
  Administered 2019-09-10: 1 mL via OPHTHALMIC

## 2019-09-10 MED ORDER — BSS IO SOLN
INTRAOCULAR | Status: DC | PRN
  Administered 2019-09-10: 15 mL via INTRAOCULAR

## 2019-09-10 MED ORDER — MIDAZOLAM HCL 2 MG/2ML IJ SOLN
INTRAMUSCULAR | Status: AC
  Filled 2019-09-10: qty 2

## 2019-09-10 SURGICAL SUPPLY — 16 items
CLOTH BEACON ORANGE TIMEOUT ST (SAFETY) ×4 IMPLANT
EYE SHIELD UNIVERSAL CLEAR (GAUZE/BANDAGES/DRESSINGS) ×4 IMPLANT
GLOVE BIOGEL PI IND STRL 6.5 (GLOVE) ×2 IMPLANT
GLOVE BIOGEL PI IND STRL 7.0 (GLOVE) ×4 IMPLANT
GLOVE BIOGEL PI INDICATOR 6.5 (GLOVE) ×2
GLOVE BIOGEL PI INDICATOR 7.0 (GLOVE) ×4
GLOVE SURG SS PI 7.5 STRL IVOR (GLOVE) ×4 IMPLANT
GOWN STRL REUS W/TWL LRG LVL3 (GOWN DISPOSABLE) ×4 IMPLANT
LENS ALC ACRYL/TECN (Ophthalmic Related) ×4 IMPLANT
NEEDLE HYPO 18GX1.5 BLUNT FILL (NEEDLE) ×4 IMPLANT
PAD ARMBOARD 7.5X6 YLW CONV (MISCELLANEOUS) ×4 IMPLANT
SYR TB 1ML LL NO SAFETY (SYRINGE) ×4 IMPLANT
TAPE SURG TRANSPORE 1 IN (GAUZE/BANDAGES/DRESSINGS) ×2 IMPLANT
TAPE SURGICAL TRANSPORE 1 IN (GAUZE/BANDAGES/DRESSINGS) ×4
VISCOELASTIC ADDITIONAL (OPHTHALMIC RELATED) ×4 IMPLANT
WATER STERILE IRR 250ML POUR (IV SOLUTION) ×4 IMPLANT

## 2019-09-10 NOTE — Discharge Instructions (Signed)
              Monitored Anesthesia Care, Care After These instructions provide you with information about caring for yourself after your procedure. Your health care provider may also give you more specific instructions. Your treatment has been planned according to current medical practices, but problems sometimes occur. Call your health care provider if you have any problems or questions after your procedure. What can I expect after the procedure? After your procedure, you may:  Feel sleepy for several hours.  Feel clumsy and have poor balance for several hours.  Feel forgetful about what happened after the procedure.  Have poor judgment for several hours.  Feel nauseous or vomit.  Have a sore throat if you had a breathing tube during the procedure. Follow these instructions at home: For at least 24 hours after the procedure:      Have a responsible adult stay with you. It is important to have someone help care for you until you are awake and alert.  Rest as needed.  Do not: ? Participate in activities in which you could fall or become injured. ? Drive. ? Use heavy machinery. ? Drink alcohol. ? Take sleeping pills or medicines that cause drowsiness. ? Make important decisions or sign legal documents. ? Take care of children on your own. Eating and drinking  Follow the diet that is recommended by your health care provider.  If you vomit, drink water, juice, or soup when you can drink without vomiting.  Make sure you have little or no nausea before eating solid foods. General instructions  Take over-the-counter and prescription medicines only as told by your health care provider.  If you have sleep apnea, surgery and certain medicines can increase your risk for breathing problems. Follow instructions from your health care provider about wearing your sleep device: ? Anytime you are sleeping, including during daytime naps. ? While taking prescription pain medicines, sleeping  medicines, or medicines that make you drowsy.  If you smoke, do not smoke without supervision.  Keep all follow-up visits as told by your health care provider. This is important. Contact a health care provider if:  You keep feeling nauseous or you keep vomiting.  You feel light-headed.  You develop a rash.  You have a fever. Get help right away if:  You have trouble breathing. Summary  For several hours after your procedure, you may feel sleepy and have poor judgment.  Have a responsible adult stay with you for at least 24 hours or until you are awake and alert. This information is not intended to replace advice given to you by your health care provider. Make sure you discuss any questions you have with your health care provider. Document Revised: 04/07/2017 Document Reviewed: 04/30/2015 Elsevier Patient Education  2020 Elsevier Inc.  

## 2019-09-10 NOTE — Anesthesia Postprocedure Evaluation (Signed)
Anesthesia Post Note  Patient: Melissa Paul  Procedure(s) Performed: CATARACT EXTRACTION PHACO AND INTRAOCULAR LENS PLACEMENT RIGHT EYE with placement of corticosteroid (Right Eye)  Patient location during evaluation: Phase II Anesthesia Type: MAC Level of consciousness: awake, awake and alert, oriented and patient cooperative Pain management: pain level controlled Vital Signs Assessment: post-procedure vital signs reviewed and stable Respiratory status: spontaneous breathing, respiratory function stable and nonlabored ventilation Cardiovascular status: blood pressure returned to baseline and stable Postop Assessment: no apparent nausea or vomiting Anesthetic complications: no   No complications documented.   Last Vitals:  Vitals:   09/10/19 0854  BP: 116/64  Pulse: 73  Resp: 20  Temp: 36.6 C  SpO2: 100%    Last Pain:  Vitals:   09/10/19 0854  TempSrc: Oral  PainSc: 5                  Kino Dunsworth L

## 2019-09-10 NOTE — Op Note (Signed)
Date of procedure: 09/10/19  Pre-operative diagnosis:  Visually significant combined form age-related cataract, Right Eye (H25.811)  Post-operative diagnosis:   1. Visually significant combined form age-related cataract, Right Eye (H25.811) 2. Pain and inflammation following cataract surgery   Procedure:  1. Removal of cataract via phacoemulsification and insertion of intra-ocular lens [place]  +22.0D into the capsular bag of the Right Eye 2. Placement of Dextenza insert, Right Eye  Attending surgeon: Gerda Diss. Fianna Snowball, MD, MA  Anesthesia: MAC, Topical [place]  Complications: None  Estimated Blood Loss: <35m (minimal)  Specimens: None  Implants: As above  Indications:  Visually significant age-related cataract, Right Eye  Procedure:  The patient was seen and identified in the pre-operative area. The operative eye was identified and dilated.  The operative eye was marked.  Topical anesthesia was administered to the operative eye.     The patient was then to the operative suite and placed in the supine position.  A timeout was performed confirming the patient, procedure to be performed, and all other relevant information.   The patient's face was prepped and draped in the usual fashion for intra-ocular surgery.  A lid speculum was placed into the operative eye and the surgical microscope moved into place and focused.  A superotemporal paracentesis was created using a 20 gauge paracentesis blade.  Shugarcaine was injected into the anterior chamber.  Viscoelastic was injected into the anterior chamber.  A temporal clear-corneal main wound incision was created using a 2.460mmicrokeratome.  A continuous curvilinear capsulorrhexis was initiated using an irrigating cystitome and completed using capsulorrhexis forceps.  Hydrodissection and hydrodeliniation were performed.  Viscoelastic was injected into the anterior chamber.  A phacoemulsification handpiece and a chopper as  a second instrument were used to remove the nucleus and epinucleus. The irrigation/aspiration handpiece was used to remove any remaining cortical material.   The capsular bag was reinflated with viscoelastic, checked, and found to be intact.  The intraocular lens was inserted into the capsular bag.  The irrigation/aspiration handpiece was used to remove any remaining viscoelastic.  The clear corneal wound and paracentesis wounds were then hydrated and checked with Weck-Cels to be watertight.  The lid-speculum was removed. A Dextenza implant was placed in the lower canaliculus. The drape was removed.  The patient's face was cleaned with a wet and dry 4x4. A clear shield was taped over the eye. The patient was taken to the post-operative care unit in good condition, having tolerated the procedure well.  Post-Op Instructions: [facility] and will receive all other orders and instructions.

## 2019-09-10 NOTE — Progress Notes (Signed)
EPIC Encounter for ICM Monitoring  Patient Name: Melissa Paul is a 74 y.o. female Date: 09/10/2019 Primary Care Physican: Sharion Balloon, FNP Primary Cardiologist:McDowell/[facility] Electrophysiologist: Allred Bi-V Pacing:95% 08/17/2019 OfficeWeight:181lbs  AT/AF Burden:2.6%(taking Eliquis)    Transmission reviewed.  CorVueThoracic impedancenormal.  Prescribed:  Furosemide80 mgTake 1 tablet (80 mg total) by mouth daily. Additional 80 mg as needed for extra fluid  Spirolactone 25 mg take 1 tablet daily  Eliquis 5 mg take 1 tablet twice a day  Labs: 08/24/2019 Creatinine 1.08, BUN 13, Potassium 3.6, Sodium 134, GFR 50-58 04/06/2019 Creatinine1.01, BUN11, Potassium3.9, Sodium134, GFR54->60 03/26/2019 Creatinine1.17, BUN9, Potassium 4.3, Sodium135, BCW88-89  03/19/2019 Creatinine1.20, BUN12, Potassium3.4, Sodium136, VQX45-03 A complete set of results can be found in Results Review.  Recommendations:No change  Follow-up plan: ICM clinic phone appointment on9/20/2021. 91 day device clinic remote transmission9/27/2021.   EP/Cardiology Office Visits: 12/14/2019 with Dr. Domenic Polite.    Copy of ICM check sent to Dr. [name].   3 month ICM trend: 09/06/2019    1 Year ICM trend:       [name], RN 09/10/2019 3:38 PM

## 2019-09-10 NOTE — Interval H&P Note (Signed)
History and Physical Interval Note:  09/10/2019 9:19 AM  Melissa Paul  has presented today for surgery, with the diagnosis of Nuclear sclerotic cataract - Right eye.  The various methods of treatment have been discussed with the patient and family. After consideration of risks, benefits and other options for treatment, the patient has consented to  Procedure(s) with comments: CATARACT EXTRACTION PHACO AND INTRAOCULAR LENS PLACEMENT RIGHT EYE (Right) - CDE:  as a surgical intervention.  The patient's history has been reviewed, patient examined, no change in status, stable for surgery.  I have reviewed the patient's chart and labs.  Questions were answered to the patient's satisfaction.     Baruch Goldmann

## 2019-09-10 NOTE — Transfer of Care (Signed)
Immediate Anesthesia Transfer of Care Note  Patient: Melissa Paul  Procedure(s) Performed: CATARACT EXTRACTION PHACO AND INTRAOCULAR LENS PLACEMENT RIGHT EYE with placement of corticosteroid (Right Eye)  Patient Location: PACU  Anesthesia Type:MAC  Level of Consciousness: awake, alert , oriented and patient cooperative  Airway & Oxygen Therapy: Patient Spontanous Breathing  Post-op Assessment: Report given to RN and Post -op Vital signs reviewed and stable  Post vital signs: Reviewed and stable  Last Vitals:  Vitals Value Taken Time  BP 90/56 0949  Temp    Pulse 70   Resp    SpO2 100     Last Pain:  Vitals:   09/10/19 0854  TempSrc: Oral  PainSc: 5       Patients Stated Pain Goal: 8 (22/30/09 7949)  Complications: No complications documented.

## 2019-09-10 NOTE — Anesthesia Preprocedure Evaluation (Addendum)
Anesthesia Evaluation  Patient identified by MRN, date of birth, ID band Patient awake    Reviewed: Allergy & Precautions, NPO status , Patient's Chart, lab work & pertinent test results  History of Anesthesia Complications Negative for: history of anesthetic complications  Airway Mallampati: II  TM Distance: >3 FB Neck ROM: Full    Dental  (+) Missing, Poor Dentition, Dental Advisory Given   Pulmonary former smoker,           Cardiovascular METS: 3 - Mets +CHF  + dysrhythmias Atrial Fibrillation and Ventricular Tachycardia + Cardiac Defibrillator (delivered shock in 01/2019, had concussion) + Valvular Problems/Murmurs MR  Rhythm:Regular Rate:Normal  Results reviewed.  LVEF approximately 35% with mild diastolic dysfunction.  Mild to moderate mitral regurgitation.  Continue with current medications and follow-up plan   Neuro/Psych PSYCHIATRIC DISORDERS Anxiety    GI/Hepatic negative GI ROS,   Endo/Other  diabetes, Well Controlled, Type 2Hypothyroidism   Renal/GU Renal InsufficiencyRenal disease     Musculoskeletal   Abdominal   Peds  Hematology   Anesthesia Other Findings Dental abscess   Reproductive/Obstetrics                            Anesthesia Physical  Anesthesia Plan  ASA: IV  Anesthesia Plan: MAC   Post-op Pain Management:    Induction:   PONV Risk Score and Plan:   Airway Management Planned: Nasal Cannula and Natural Airway  Additional Equipment:   Intra-op Plan:   Post-operative Plan:   Informed Consent: I have reviewed the patients History and Physical, chart, labs and discussed the procedure including the risks, benefits and alternatives for the proposed anesthesia with the patient or authorized representative who has indicated his/her understanding and acceptance.       Plan Discussed with: CRNA and Surgeon  Anesthesia Plan Comments:          Anesthesia Quick Evaluation

## 2019-09-15 NOTE — Telephone Encounter (Signed)
Sent in application via fax.  Will follow up.  

## 2019-09-17 NOTE — Telephone Encounter (Signed)
Advanced Heart Failure Patient Advocate Encounter   Patient was approved to receive Eliquis from [employer].  Patient ID: [id number] Effective dates: 09/16/19 through 09/14/20  Charlann Boxer, CPhT

## 2019-09-24 ENCOUNTER — Other Ambulatory Visit: Payer: Self-pay | Admitting: Family

## 2019-10-01 ENCOUNTER — Ambulatory Visit
Admission: EM | Admit: 2019-10-01 | Discharge: 2019-10-01 | Disposition: A | Discharge status: Home / Self Care | Payer: Medicare Other | Attending: Emergency Medicine | Admitting: Emergency Medicine

## 2019-10-01 ENCOUNTER — Other Ambulatory Visit: Payer: Self-pay

## 2019-10-01 ENCOUNTER — Encounter: Payer: Self-pay | Admitting: Emergency Medicine

## 2019-10-01 DIAGNOSIS — N390 Urinary tract infection, site not specified: Secondary | ICD-10-CM

## 2019-10-01 LAB — POCT URINALYSIS DIP (MANUAL ENTRY)
Bilirubin, UA: NEGATIVE
Blood, UA: NEGATIVE
Glucose, UA: 100 mg/dL — AB
Ketones, POC UA: NEGATIVE mg/dL
Nitrite, UA: POSITIVE — AB
Protein Ur, POC: NEGATIVE mg/dL
Spec Grav, UA: 1.02 (ref 1.010–1.025)
Urobilinogen, UA: 0.2 E.U./dL
pH, UA: 5.5 (ref 5.0–8.0)

## 2019-10-01 MED ORDER — CEFUROXIME AXETIL 250 MG PO TABS: 250.0000 mg | ORAL_TABLET | Freq: Two times a day (BID) | ORAL | 0 refills | Status: AC

## 2019-10-01 NOTE — ED Provider Notes (Signed)
[facility]   Chief Complaint  Patient presents with  . Urinary Tract Infection     SUBJECTIVE:  Melissa Paul is a 74 y.o. female who presented to the urgent care for complaint of right-sided flank pain and pressure in her bladder for the past 2 weeks.  Patient denies a precipitating event, recent sexual encounter, excessive caffeine intake.  Localizes the pain to the  flank.  Pain is intermittent and described as achy.  Has tried OTC medications without relief.  Symptoms are made worse with urination.  Admits to similar symptoms in the past.  Denies fever, chills, nausea, vomiting, abdominal pain, flank pain, abnormal vaginal discharge or bleeding, hematuria.    LMP: No LMP recorded. Patient has had a hysterectomy.  ROS: As in HPI.  All other pertinent ROS negative.     Past Medical History:  Diagnosis Date  . Anxiety   . Chronic systolic heart failure ([place])   . Constipation   . Cyst of right kidney 11/12/2017  . Glucose intolerance (pre-diabetes)   . Hyperlipemia   . Hypothyroidism   . LBBB (left bundle branch block)   . Mitral regurgitation    Mild to Moderate  . Nonischemic cardiomyopathy ([place])    LVEF 35-40% by echo 8/13  . Paroxysmal atrial fibrillation ([facility])    Brief episodes by device interrogation  . UTI (urinary tract infection)    Recurrent  . Ventricular tachycardia ([place])    ICD therapy for VT   Past Surgical History:  Procedure Laterality Date  . APPENDECTOMY    . BIV ICD GENERATOR CHANGEOUT N/A 07/11/2016   Procedure: BiV ICD Generator Changeout;  Surgeon: Thompson Grayer, MD;  Location: [facility];  Service: Cardiovascular;  Laterality: N/A;  . CARDIAC DEFIBRILLATOR PLACEMENT  09/2009   St[employer] Jude BiV ICD by Dr. Sandria Manly class III  . CHOLECYSTECTOMY    . has one ovary left    . KNEE ARTHROSCOPY     Left  . LEFT AND RIGHT HEART CATHETERIZATION WITH CORONARY ANGIOGRAM N/A 04/12/2014   Procedure: LEFT AND RIGHT HEART CATHETERIZATION  WITH CORONARY ANGIOGRAM;  Surgeon: Leonie Man, MD;  Location: [facility];  Service: Cardiovascular;  Laterality: N/A;  . Left breast biopsy x 2     no maliganancy  . RIGHT/LEFT HEART CATH AND CORONARY ANGIOGRAPHY N/A 03/26/2019   Procedure: RIGHT/LEFT HEART CATH AND CORONARY ANGIOGRAPHY;  Surgeon: [name], MD;  Location: [facility];  Service: Cardiovascular;  Laterality: N/A;  . TOTAL ABDOMINAL HYSTERECTOMY     (R) ovary removed   Allergies  Allergen Reactions  . Buspar [Buspirone] Other (See Comments)    Patient states that she became hyper and had lots of itching.  L[place]ous[place] [Escitalopram Oxalate] Other (See Comments)    Patient states that she became hyper and lots of itching  . Bactrim Rash  . Cephalexin Rash    Says she is able to take cefuroxime  . Chlorhexidine Gluconate Hives and Itching    Hives, itching after using wipes with last procedure  . Clindamycin Rash       . Contrast Media [Iodinated Diagnostic Agents] Rash  . Doxycycline Rash  . Latex Rash       . Levofloxacin Rash       . Nitrofurantoin Monohyd Macro Rash       . Penicillins Swelling and Rash    Has patient had a PCN reaction causing immediate rash, facial/tongue/throat swelling, SOB or  lightheadedness with hypotension: Yes Has patient had a PCN reaction causing severe rash involving mucus membranes or skin necrosis: Yes Has patient had a PCN reaction that required hospitalization: No Has patient had a PCN reaction occurring within the last 10 years: No Rash/swelled tongue If all of the above answers are "NO", then may proceed with Cephalosporin use.    No current facility-administered medications on file prior to encounter.   Current Outpatient Medications on File Prior to Encounter  Medication Sig Dispense Refill  . apixaban (ELIQUIS) 5 MG TABS tablet Take 1 tablet (5 mg total) by mouth 2 (two) times daily. 60 tablet 6  . carvedilol (COREG) 6.25 MG tablet Take 3 tablets  (18.75 mg total) by mouth 2 (two) times daily. 180 tablet 10  . clindamycin (CLEOCIN) 300 MG capsule Take 1 capsule (300 mg total) by mouth 3 (three) times daily. 30 capsule 0  . digoxin (LANOXIN) 0.125 MG tablet Take 0.0625 mg by mouth daily.     . furosemide (LASIX) 80 MG tablet Take 1 tablet (80 mg total) by mouth daily. Additional 80 mg as needed for extra fluid 90 tablet 1  . hydroxypropyl methylcellulose / hypromellose (ISOPTO TEARS / GONIOVISC) 2.5 % ophthalmic solution Place 1 drop into both eyes 2 (two) times daily as needed for dry eyes.    . hydrOXYzine (VISTARIL) 25 MG capsule Take 1 capsule (25 mg total) by mouth 3 (three) times daily as needed. 90 capsule 2  . levothyroxine (EUTHYROX) 88 MCG tablet Take 1 tablet (88 mcg total) by mouth daily before breakfast. 30 tablet 2  . metFORMIN (GLUCOPHAGE) 500 MG tablet TAKE 1 TABLET BY MOUTH TWICE DAILY WITH A MEAL 60 tablet 1  . neomycin-bacitracin-polymyxin (NEOSPORIN) ointment Apply 1 application topically daily as needed (psoriasis wounds).    . pantoprazole (PROTONIX) 40 MG tablet TAKE 1 TABLET BY MOUTH ONCE DAILY BEFORE BREAKFAST (Patient taking differently: Take 40 mg by mouth daily before breakfast. ) 30 tablet 0  . spironolactone (ALDACTONE) 25 MG tablet Take 1 tablet (25 mg total) by mouth at bedtime. 90 tablet 3   Social History   Socioeconomic History  . Marital status: Widowed    Spouse name: Not on file  . Number of children: Not on file  . Years of education: Not on file  . Highest education level: Not on file  Occupational History  . Not on file  Tobacco Use  . Smoking status: Former Smoker    Packs/day: 0.30    Years: 20.00    Pack years: 6.00    Types: Cigarettes    Start date: 08/03/1949    Quit date: 01/22/1996    Years since quitting: 23.7  . Smokeless tobacco: Never Used  Vaping Use  . Vaping Use: Never used  Substance and Sexual Activity  . Alcohol use: No    Alcohol/week: 0.0 standard drinks  . Drug use:  No  . Sexual activity: Not on file  Other Topics Concern  . Not on file  Social History Narrative  . Not on file   Social Determinants of Health   Financial Resource Strain:   . Difficulty of Paying Living Expenses: Not on file  Food Insecurity:   . Worried About Charity fundraiser in the Last Year: Not on file  . Ran Out of Food in the Last Year: Not on file  Transportation Needs:   . Lack of Transportation (Medical): Not on file  . Lack of Transportation (Non-Medical): Not on  file  Physical Activity:   . Days of Exercise per Week: Not on file  . Minutes of Exercise per Session: Not on file  Stress:   . Feeling of Stress : Not on file  Social Connections:   . Frequency of Communication with Friends and Family: Not on file  . Frequency of Social Gatherings with Friends and Family: Not on file  . Attends Religious Services: Not on file  . Active Member of Clubs or Organizations: Not on file  . Attends Archivist Meetings: Not on file  . Marital Status: Not on file  Intimate Partner Violence:   . Fear of Current or Ex-Partner: Not on file  . Emotionally Abused: Not on file  . Physically Abused: Not on file  . Sexually Abused: Not on file   Family History  Problem Relation Age of Onset  . CVA Mother        multiple  . Heart Problems Mother        "weak heart"  . Heart disease Maternal Grandmother   . Heart disease Maternal Grandfather   . Cancer Father        Stomach and eshpogus  . Diabetes Neg Hx   . Hypertension Neg Hx   . Coronary artery disease Neg Hx     OBJECTIVE:  Vitals:   10/01/19 1711  BP: (!) 81/57  Pulse: 95  Resp: 15  Temp: 97.9 F (36.6 C)  TempSrc: Oral  SpO2: 98%   General appearance: AOx3 in no acute distress HEENT: NCAT.  Oropharynx clear.  Lungs: clear to auscultation bilaterally without adventitious breath sounds Heart: regular rate and rhythm.  Radial pulses 2+ symmetrical bilaterally Abdomen: soft; non-distended; no  tenderness; bowel sounds present; no guarding or rebound tenderness Back: no CVA tenderness Extremities: no edema; symmetrical with no gross deformities Skin: warm and dry Neurologic: Ambulates from chair to exam table without difficulty Psychological: alert and cooperative; normal mood and affect  Labs Reviewed  POCT URINALYSIS DIP (MANUAL ENTRY) - Abnormal; Notable for the following components:      Result Value   Glucose, UA =100 (*)    Nitrite, UA Positive (*)    Leukocytes, UA Trace (*)    All other components within normal limits  URINE CULTURE    ASSESSMENT & PLAN:  1. Acute lower UTI     Meds ordered this encounter  Medications  . cefUROXime (CEFTIN) 250 MG tablet    Sig: Take 1 tablet (250 mg total) by mouth 2 (two) times daily with a meal for 7 days.    Dispense:  14 tablet    Refill:  0   Ceftin was prescribed as patient is allergic to Bactrim DS, Keflex and cannot take [facility]  Discharge instruction  Urine culture sent.  We will call you with the results.   Push fluids and get plenty of rest.   Take antibiotic as directed and to completion Follow up with PCP if symptoms persists Return here or go to ER if you have any new or worsening symptoms such as fever, worsening abdominal pain, nausea/vomiting, flank pain, etc...  Outlined signs and symptoms indicating need for more acute intervention. Patient verbalized understanding. After Visit Summary given.    Note: This document was prepared using Dragon voice recognition software and may include unintentional dictation errors.    Emerson Monte, [place] 10/01/19 1814

## 2019-10-01 NOTE — Discharge Instructions (Addendum)
Urine culture sent.  We will call you with the results.   Push fluids and get plenty of rest.   Take antibiotic as directed and to completion Follow up with PCP if symptoms persists Return here or go to ER if you have any new or worsening symptoms such as fever, worsening abdominal pain, nausea/vomiting, flank pain, etc... 

## 2019-10-01 NOTE — ED Triage Notes (Signed)
Patient states she has right side flank pain and haveing pressure in bladder, also has rash all over body x 2 weeks and has worsened.

## 2019-10-11 ENCOUNTER — Ambulatory Visit (INDEPENDENT_AMBULATORY_CARE_PROVIDER_SITE_OTHER): Payer: Medicare Other

## 2019-10-11 DIAGNOSIS — Z95810 Presence of automatic (implantable) cardiac defibrillator: Secondary | ICD-10-CM

## 2019-10-11 DIAGNOSIS — I5022 Chronic systolic (congestive) heart failure: Secondary | ICD-10-CM

## 2019-10-12 NOTE — Progress Notes (Signed)
EPIC Encounter for ICM Monitoring  Patient Name: Melissa Paul is a 74 y.o. female Date: 10/12/2019 Primary Care Physican: Sharion Balloon, FNP Primary Cardiologist:[facility] Electrophysiologist: Allred Bi-V Pacing:94% 08/17/2019 OfficeWeight:184lbs  AT/AF Burden:2.9%(taking Eliquis)    Spoke with patient and reports intermittent leg/feet swelling.     CorVueThoracic impedancesuggesting possible fluid accumulation but returning close to baseline.  Prescribed:  Furosemide80 mgTake 1 tablet (80 mg total) by mouth daily. Additional 80 mg as needed for extra fluid  Spirolactone 25 mg take 1 tablet daily  Eliquis 5 mg take 1 tablet twice a day  Labs: 08/24/2019 Creatinine 1.08, BUN 13, Potassium 3.6, Sodium 134, GFR 50-58 04/06/2019 Creatinine1.01, BUN11, Potassium3.9, Sodium134, GFR54->60 03/26/2019 Creatinine1.17, BUN9, Potassium 4.3, Sodium135, ELM76-15  03/19/2019 Creatinine1.20, BUN12, Potassium3.4, Sodium136, HID43-73 A complete set of results can be found in Results Review.  Recommendations: Pt taking extra Furosemide 80 mg when she has fluid symptoms and will take extra tomorrow.  Follow-up plan: ICM clinic phone appointment on9/27/2021 to recheck fluid levels. 91 day device clinic remote transmission9/27/2021.  EP/Cardiology Office Visits:12/14/2019 with Dr.McDowell.   Copy of ICM check sent to Dr.Allred and Dr Domenic Polite.    3 month ICM trend: 10/11/2019    1 Year ICM trend:       [name], RN 10/12/2019 12:26 PM

## 2019-10-13 ENCOUNTER — Ambulatory Visit (INDEPENDENT_AMBULATORY_CARE_PROVIDER_SITE_OTHER): Payer: Medicare Other | Admitting: Family

## 2019-10-13 ENCOUNTER — Other Ambulatory Visit: Payer: Self-pay

## 2019-10-13 ENCOUNTER — Encounter: Payer: Self-pay | Admitting: Family

## 2019-10-13 VITALS — BP 108/65 | HR 72 | Temp 96.0°F | Ht 62.4 in | Wt 184.4 lb

## 2019-10-13 DIAGNOSIS — I5023 Acute on chronic systolic (congestive) heart failure: Secondary | ICD-10-CM

## 2019-10-13 DIAGNOSIS — I480 Paroxysmal atrial fibrillation: Secondary | ICD-10-CM

## 2019-10-13 DIAGNOSIS — B3731 Acute candidiasis of vulva and vagina: Secondary | ICD-10-CM

## 2019-10-13 DIAGNOSIS — E1169 Type 2 diabetes mellitus with other specified complication: Secondary | ICD-10-CM

## 2019-10-13 DIAGNOSIS — I5022 Chronic systolic (congestive) heart failure: Secondary | ICD-10-CM

## 2019-10-13 DIAGNOSIS — B370 Candidal stomatitis: Secondary | ICD-10-CM

## 2019-10-13 DIAGNOSIS — I428 Other cardiomyopathies: Secondary | ICD-10-CM

## 2019-10-13 DIAGNOSIS — I472 Ventricular tachycardia, unspecified: Secondary | ICD-10-CM

## 2019-10-13 DIAGNOSIS — K219 Gastro-esophageal reflux disease without esophagitis: Secondary | ICD-10-CM

## 2019-10-13 DIAGNOSIS — R300 Dysuria: Secondary | ICD-10-CM | POA: Diagnosis not present

## 2019-10-13 DIAGNOSIS — I080 Rheumatic disorders of both mitral and aortic valves: Secondary | ICD-10-CM

## 2019-10-13 DIAGNOSIS — Z780 Asymptomatic menopausal state: Secondary | ICD-10-CM

## 2019-10-13 DIAGNOSIS — F411 Generalized anxiety disorder: Secondary | ICD-10-CM

## 2019-10-13 DIAGNOSIS — E039 Hypothyroidism, unspecified: Secondary | ICD-10-CM

## 2019-10-13 DIAGNOSIS — B373 Candidiasis of vulva and vagina: Secondary | ICD-10-CM

## 2019-10-13 DIAGNOSIS — K5900 Constipation, unspecified: Secondary | ICD-10-CM

## 2019-10-13 DIAGNOSIS — Z7189 Other specified counseling: Secondary | ICD-10-CM

## 2019-10-13 LAB — MICROSCOPIC EXAMINATION
Bacteria, UA: NONE SEEN
RBC, Urine: NONE SEEN /hpf (ref 0–2)

## 2019-10-13 LAB — URINALYSIS, COMPLETE
Bilirubin, UA: NEGATIVE
Glucose, UA: NEGATIVE
Leukocytes,UA: NEGATIVE
Nitrite, UA: NEGATIVE
Protein,UA: NEGATIVE
Specific Gravity, UA: 1.02 (ref 1.005–1.030)
Urobilinogen, Ur: 0.2 mg/dL (ref 0.2–1.0)
pH, UA: 5.5 (ref 5.0–7.5)

## 2019-10-13 MED ORDER — PANTOPRAZOLE SODIUM 40 MG PO TBEC: 40.0000 mg | DELAYED_RELEASE_TABLET | Freq: Every day | ORAL | 2 refills | Status: DC

## 2019-10-13 MED ORDER — NYSTATIN 100000 UNIT/ML MT SUSP: 5.0000 mL | Freq: Four times a day (QID) | OROMUCOSAL | 1 refills | Status: DC

## 2019-10-13 MED ORDER — BLOOD GLUCOSE METER KIT: PACK | 0 refills | Status: DC

## 2019-10-13 MED ORDER — TRIAMCINOLONE ACETONIDE 0.5 % EX OINT: 1.0000 "application " | TOPICAL_OINTMENT | Freq: Two times a day (BID) | CUTANEOUS | 2 refills | Status: DC

## 2019-10-13 MED ORDER — METFORMIN HCL ER 750 MG PO TB24: 1500.0000 mg | ORAL_TABLET | Freq: Every day | ORAL | 2 refills | Status: DC

## 2019-10-13 MED ORDER — FLUCONAZOLE 150 MG PO TABS: 150.0000 mg | ORAL_TABLET | ORAL | 0 refills | Status: DC | PRN

## 2019-10-13 NOTE — Patient Instructions (Signed)

## 2019-10-13 NOTE — Progress Notes (Signed)
Subjective:    Patient ID: Melissa Paul, female    DOB: Dec 28, 1945, 74 y.o.   MRN: [id number]  Chief Complaint  Patient presents with  . Medical Management of Chronic Issues    6 mth follow up just had A1c 08/24/19 in hospital 9.6  . Diabetes  . Dysuria    having pelvic pressure seen in urgent care 09/10. Was treated    Pt presents to the office today for chronic follow up. She is followed by Cardiologists 3 months for CHF, Cardiomyopathy, A Fib, and ventricular tachyarrhythmia. She has a pacemaker. She takes Eliquis BID.  Diabetes She presents for her follow-up diabetic visit. She has type 2 diabetes mellitus. Her disease course has been stable. Hypoglycemia symptoms include nervousness/anxiousness. Associated symptoms include blurred vision and fatigue. Pertinent negatives for diabetes include no foot paresthesias. There are no hypoglycemic complications. Symptoms are stable. Diabetic complications include heart disease and peripheral neuropathy. Risk factors for coronary artery disease include hypertension, sedentary lifestyle, post-menopausal, family history and diabetes mellitus. (Does not check BS at home) An ACE inhibitor/angiotensin II receptor blocker is being taken.  Dysuria  This is a chronic problem. The current episode started in the past 7 days. The problem occurs intermittently. The problem has been waxing and waning. The quality of the pain is described as burning. The pain is at a severity of 8/10. The pain is moderate. Associated symptoms include hematuria, nausea and urgency. Pertinent negatives include no frequency. She has tried increased fluids and antibiotics for the symptoms. The treatment provided mild relief. Her past medical history is significant for recurrent UTIs.  Thyroid Problem Presents for follow-up visit. Symptoms include anxiety, constipation and fatigue. Patient reports no depressed mood or hoarse voice. The symptoms have been stable.  Anxiety Presents  for follow-up visit. Symptoms include irritability, nausea and nervous/anxious behavior. Patient reports no depressed mood. Symptoms occur occasionally. The quality of sleep is good.    Congestive Heart Failure Presents for follow-up visit. Associated symptoms include fatigue and orthopnea. The symptoms have been stable.  Rash This is a recurrent problem. The current episode started more than 1 month ago. The problem has been waxing and waning since onset. The affected locations include the left arm and right arm. The rash is characterized by redness and itchiness. Associated symptoms include fatigue. Past treatments include topical steroids. The treatment provided mild relief.      Review of Systems  Constitutional: Positive for fatigue and irritability.  HENT: Negative for hoarse voice.   Eyes: Positive for blurred vision.  Gastrointestinal: Positive for constipation and nausea.  Genitourinary: Positive for dysuria, hematuria and urgency. Negative for frequency.  Skin: Positive for rash.  Psychiatric/Behavioral: The patient is nervous/anxious.   All other systems reviewed and are negative.  Family History  Problem Relation Age of Onset  . CVA Mother        multiple  . Heart Problems Mother        "weak heart"  . Heart disease Maternal Grandmother   . Heart disease Maternal Grandfather   . Cancer Father        Stomach and eshpogus  . Diabetes Neg Hx   . Hypertension Neg Hx   . Coronary artery disease Neg Hx    Social History   Socioeconomic History  . Marital status: Widowed    Spouse name: Not on file  . Number of children: Not on file  . Years of education: Not on file  . Highest education level:  Not on file  Occupational History  . Not on file  Tobacco Use  . Smoking status: Former Smoker    Packs/day: 0.30    Years: 20.00    Pack years: 6.00    Types: Cigarettes    Start date: 08/03/1949    Quit date: 01/22/1996    Years since quitting: 23.7  . Smokeless  tobacco: Never Used  Vaping Use  . Vaping Use: Never used  Substance and Sexual Activity  . Alcohol use: No    Alcohol/week: 0.0 standard drinks  . Drug use: No  . Sexual activity: Not on file  Other Topics Concern  . Not on file  Social History Narrative  . Not on file   Social Determinants of Health   Financial Resource Strain:   . Difficulty of Paying Living Expenses: Not on file  Food Insecurity:   . Worried About Charity fundraiser in the Last Year: Not on file  . Ran Out of Food in the Last Year: Not on file  Transportation Needs:   . Lack of Transportation (Medical): Not on file  . Lack of Transportation (Non-Medical): Not on file  Physical Activity:   . Days of Exercise per Week: Not on file  . Minutes of Exercise per Session: Not on file  Stress:   . Feeling of Stress : Not on file  Social Connections:   . Frequency of Communication with Friends and Family: Not on file  . Frequency of Social Gatherings with Friends and Family: Not on file  . Attends Religious Services: Not on file  . Active Member of Clubs or Organizations: Not on file  . Attends Archivist Meetings: Not on file  . Marital Status: Not on file       Objective:   Physical Exam Vitals reviewed.  Constitutional:      General: She is not in acute distress.    Appearance: She is well-developed.  HENT:     Head: Normocephalic and atraumatic.     Right Ear: Tympanic membrane normal.     Left Ear: Tympanic membrane normal.     Mouth/Throat:     Comments: White coating on tongue Eyes:     Pupils: Pupils are equal, round, and reactive to light.  Neck:     Thyroid: No thyromegaly.  Cardiovascular:     Rate and Rhythm: Normal rate and regular rhythm.     Heart sounds: Normal heart sounds. No murmur heard.   Pulmonary:     Effort: Pulmonary effort is normal. No respiratory distress.     Breath sounds: Normal breath sounds. No wheezing.  Abdominal:     General: Bowel sounds are  normal. There is no distension.     Palpations: Abdomen is soft.     Tenderness: There is no abdominal tenderness.  Musculoskeletal:        General: No tenderness. Normal range of motion.     Cervical back: Normal range of motion and neck supple.  Skin:    General: Skin is warm and dry.     Findings: Rash present.     Comments: Erythemas rash on bilateral arms  Neurological:     Mental Status: She is alert and oriented to person, place, and time.     Cranial Nerves: No cranial nerve deficit.     Deep Tendon Reflexes: Reflexes are normal and symmetric.  Psychiatric:        Behavior: Behavior normal.  Thought Content: Thought content normal.        Judgment: Judgment normal.     BP 108/65   Pulse 72   Temp (!) 96 F (35.6 C) (Temporal)   Ht 5' 2.4" (1.585 m)   Wt 184 lb 6.4 oz (83.6 kg)   SpO2 96%   BMI 33.30 kg/m        Assessment & Plan:  Melissa Paul comes in today with chief complaint of Medical Management of Chronic Issues (6 mth follow up just had A1c 08/24/19 in hospital 9.6), Diabetes, and Dysuria (having pelvic pressure seen in urgent care 09/10. Was treated )   Diagnosis and orders addressed:  1. Dysuria - Urinalysis, Complete - Urine Culture - CMP14+EGFR - CBC with Differential/Platelet  2. Acute on chronic systolic ACC/AHA stage C congestive heart failure (HCC) - CMP14+EGFR - CBC with Differential/Platelet  3. Paroxysmal atrial fibrillation (HCC) - CMP14+EGFR - CBC with Differential/Platelet  4. Chronic systolic heart failure (HCC) - CMP14+EGFR - CBC with Differential/Platelet  5. MITRAL REGURGITATION - CMP14+EGFR - CBC with Differential/Platelet  6. Nonischemic cardiomyopathy (HCC) - CMP14+EGFR - CBC with Differential/Platelet  7. Ventricular tachyarrhythmia (HCC) - CMP14+EGFR - CBC with Differential/Platelet  8. Type 2 diabetes mellitus with other specified complication, without long-term current use of insulin (HCC) Will  increase Metformin to 1500 mg from 1000 mg - blood glucose meter kit and supplies; Dispense based on patient and insurance preference. Use up to four times daily as directed. (FOR ICD-10 E10.9, E11.9).  Dispense: 1 each; Refill: 0 - metFORMIN (GLUCOPHAGE XR) 750 MG 24 hr tablet; Take 2 tablets (1,500 mg total) by mouth daily with breakfast.  Dispense: 180 tablet; Refill: 2 - CMP14+EGFR - CBC with Differential/Platelet - Microalbumin / creatinine urine ratio  9. Hypothyroidism, unspecified type - CMP14+EGFR - CBC with Differential/Platelet - TSH  10. Constipation, unspecified constipation type - CMP14+EGFR - CBC with Differential/Platelet  11. GAD (generalized anxiety disorder) - CMP14+EGFR - CBC with Differential/Platelet  12. Gastroesophageal reflux disease, unspecified whether esophagitis present - pantoprazole (PROTONIX) 40 MG tablet; Take 1 tablet (40 mg total) by mouth daily before breakfast.  Dispense: 90 tablet; Refill: 2 - CMP14+EGFR - CBC with Differential/Platelet  13. Post-menopausal - CMP14+EGFR - CBC with Differential/Platelet - DG [facility] DEXA  14. Oral thrush - nystatin (MYCOSTATIN) 100000 UNIT/ML suspension; Take 5 mLs (500,000 Units total) by mouth 4 (four) times daily.  Dispense: 473 mL; Refill: 1 - fluconazole (DIFLUCAN) 150 MG tablet; Take 1 tablet (150 mg total) by mouth every three (3) days as needed.  Dispense: 3 tablet; Refill: 0  15. Vagina, candidiasis - fluconazole (DIFLUCAN) 150 MG tablet; Take 1 tablet (150 mg total) by mouth every three (3) days as needed.  Dispense: 3 tablet; Refill: 0  16. Educated about COVID-19 virus infection Spent 5 mins educating patient about COVID vaccine and she can take vaccine. She had "heard" she could not take vaccine because she has bee allergy. Discuss this is not true. She will think about it and will plan to get COVID vaccine.    Labs pending Health Maintenance reviewed Diet and exercise encouraged  Follow up  plan: 3 months   Spent 40 mins with patient about chronic issues and acute issues.   Evelina Dun, FNP

## 2019-10-14 LAB — MICROALBUMIN / CREATININE URINE RATIO
Creatinine, Urine: 174.2 mg/dL
Microalb/Creat Ratio: 21 mg/g creat (ref 0–29)
Microalbumin, Urine: 36.2 ug/mL

## 2019-10-16 LAB — URINE CULTURE

## 2019-10-18 ENCOUNTER — Ambulatory Visit (INDEPENDENT_AMBULATORY_CARE_PROVIDER_SITE_OTHER): Payer: Medicare Other

## 2019-10-18 ENCOUNTER — Ambulatory Visit: Payer: Medicare Other | Admitting: Emergency Medicine

## 2019-10-18 ENCOUNTER — Other Ambulatory Visit: Payer: Self-pay | Admitting: Family

## 2019-10-18 DIAGNOSIS — Z95810 Presence of automatic (implantable) cardiac defibrillator: Secondary | ICD-10-CM

## 2019-10-18 DIAGNOSIS — I5022 Chronic systolic (congestive) heart failure: Secondary | ICD-10-CM

## 2019-10-18 DIAGNOSIS — I428 Other cardiomyopathies: Secondary | ICD-10-CM

## 2019-10-18 MED ORDER — CIPROFLOXACIN HCL 500 MG PO TABS: 500.0000 mg | ORAL_TABLET | Freq: Two times a day (BID) | ORAL | 0 refills | Status: DC

## 2019-10-19 ENCOUNTER — Telehealth: Payer: Self-pay | Admitting: Family

## 2019-10-19 LAB — CUP PACEART REMOTE DEVICE CHECK
Battery Remaining Longevity: 37 mo
Battery Remaining Percentage: 50 %
Battery Voltage: 2.92 V
Brady Statistic AP VP Percent: 91 %
Brady Statistic AP VS Percent: 1.4 %
Brady Statistic AS VP Percent: 3.7 %
Brady Statistic AS VS Percent: 1 %
Brady Statistic RA Percent Paced: 86 %
Date Time Interrogation Session: 20210927061309
HighPow Impedance: 84 Ohm
HighPow Impedance: 84 Ohm
Implantable Lead Implant Date: 20110912
Implantable Lead Implant Date: 20110912
Implantable Lead Implant Date: 20120710
Implantable Lead Location: 753858
Implantable Lead Location: 753859
Implantable Lead Location: 753860
Implantable Lead Model: 7122
Implantable Pulse Generator Implant Date: 20180621
Lead Channel Impedance Value: 340 Ohm
Lead Channel Impedance Value: 450 Ohm
Lead Channel Impedance Value: 590 Ohm
Lead Channel Pacing Threshold Amplitude: 0.5 V
Lead Channel Pacing Threshold Amplitude: 0.75 V
Lead Channel Pacing Threshold Amplitude: 0.75 V
Lead Channel Pacing Threshold Pulse Width: 0.5 ms
Lead Channel Pacing Threshold Pulse Width: 0.5 ms
Lead Channel Pacing Threshold Pulse Width: 0.5 ms
Lead Channel Sensing Intrinsic Amplitude: 11.8 mV
Lead Channel Sensing Intrinsic Amplitude: 2.5 mV
Lead Channel Setting Pacing Amplitude: 2 V
Lead Channel Setting Pacing Amplitude: 2 V
Lead Channel Setting Pacing Amplitude: 2 V
Lead Channel Setting Pacing Pulse Width: 0.5 ms
Lead Channel Setting Pacing Pulse Width: 0.5 ms
Lead Channel Setting Sensing Sensitivity: 0.5 mV
Pulse Gen Serial Number: 9761374

## 2019-10-19 MED ORDER — CEFUROXIME AXETIL 250 MG PO TABS: 250.0000 mg | ORAL_TABLET | Freq: Two times a day (BID) | ORAL | 0 refills | Status: DC

## 2019-10-19 NOTE — Telephone Encounter (Signed)
Pt says that she cannot take ciprofloxacin (CIPRO) 500 MG tablet. Pt had a previous allergic reaction to it. She says she take cefuroxime 200mg . [facility]

## 2019-10-19 NOTE — Telephone Encounter (Signed)
Ceftin Prescription sent to pharmacy

## 2019-10-20 NOTE — Progress Notes (Signed)
EPIC Encounter for ICM Monitoring  Patient Name: TALAYAH PICARDI is a 74 y.o. female Date: 10/20/2019 Primary Care Physican: Sharion Balloon, FNP Primary Cardiologist:McD[facility] Electrophysiologist: Allred Bi-V Pacing:94% 9/29/2021Weight:184lbs  AT/AF Burden:3.0%(taking Eliquis)    Spoke with patient.  She has another UTI and has been increasing fluid intake.    CorVueThoracic impedancereturned to baseline normal after taking extra Furosemide.  Impedance suggesting return of possible fluid accumulation 10/15/2019 and but trending close to baseline.  Prescribed:  Furosemide80 mgTake 1 tablet (80 mg total) by mouth daily. Additional 80 mg as needed for extra fluid  Spirolactone 25 mg take 1 tablet daily  Eliquis 5 mg take 1 tablet twice a day  Labs: 08/24/2019 Creatinine 1.08, BUN 13, Potassium 3.6, Sodium 134, GFR 50-58 04/06/2019 Creatinine1.01, BUN11, Potassium3.9, Sodium134, GFR54->60 03/26/2019 Creatinine1.17, BUN9, Potassium 4.3, Sodium135, HAF79-03  03/19/2019 Creatinine1.20, BUN12, Potassium3.4, Sodium136, YBF38-32 A complete set of results can be found in Results Review.  Recommendations:Advised to return to 64 oz fluid a day once UTI is resolved.  No changes and encouraged to call if experiencing any fluid symptoms.  Follow-up plan: ICM clinic phone appointment on10/25/2021. 91 day device clinic remote transmission12/27/2021.  EP/Cardiology Office Visits:12/14/2019 with Dr.McDowell.   Copy of ICM check sent to Dr.Allred and Dr Domenic Polite.    3 month ICM trend: 10/18/2019    1 Year ICM trend:       [name], RN 10/20/2019 8:35 AM

## 2019-10-21 NOTE — Addendum Note (Signed)
Addended by: Cheri Kearns A on: 10/21/2019 10:48 AM   Modules accepted: Level of Service

## 2019-10-21 NOTE — Progress Notes (Signed)
Remote ICD transmission.   

## 2019-10-24 ENCOUNTER — Other Ambulatory Visit: Payer: Self-pay | Admitting: Family

## 2019-11-01 ENCOUNTER — Telehealth: Payer: Self-pay | Admitting: Family

## 2019-11-01 ENCOUNTER — Other Ambulatory Visit: Payer: Self-pay | Admitting: Family

## 2019-11-01 DIAGNOSIS — N390 Urinary tract infection, site not specified: Secondary | ICD-10-CM

## 2019-11-01 NOTE — Telephone Encounter (Signed)
REFERRAL REQUEST Telephone Note  Have you been seen at our office for this problem? Yes, Patient stated it was discussed at her 10/13/2019 visit // Patient needs Ref for Architect (Advise that they may need an appointment with their PCP before a referral can be done)  Reason for Referral:  Referral discussed with patient: yes Best contact number of patient for referral team:    Has patient been seen by a specialist for this issue before:  Patient provider preference for referral: Would like a female Provider  Patient location preference for referral: [place] or [place]    Patient notified that referrals can take up to a week or longer to process. If they haven't heard anything within a week they should call back and speak with the referral department.

## 2019-11-01 NOTE — Progress Notes (Signed)
Referral to Urologists placed.  

## 2019-11-15 ENCOUNTER — Other Ambulatory Visit: Payer: Self-pay

## 2019-11-15 ENCOUNTER — Ambulatory Visit (INDEPENDENT_AMBULATORY_CARE_PROVIDER_SITE_OTHER): Payer: Medicare Other

## 2019-11-15 DIAGNOSIS — I5022 Chronic systolic (congestive) heart failure: Secondary | ICD-10-CM

## 2019-11-15 DIAGNOSIS — Z780 Asymptomatic menopausal state: Secondary | ICD-10-CM

## 2019-11-15 DIAGNOSIS — N390 Urinary tract infection, site not specified: Secondary | ICD-10-CM

## 2019-11-15 DIAGNOSIS — Z95810 Presence of automatic (implantable) cardiac defibrillator: Secondary | ICD-10-CM | POA: Diagnosis not present

## 2019-11-15 LAB — URINALYSIS, COMPLETE
Bilirubin, UA: NEGATIVE
Glucose, UA: NEGATIVE
Ketones, UA: NEGATIVE
Nitrite, UA: POSITIVE — AB
Protein,UA: NEGATIVE
Specific Gravity, UA: 1.015 (ref 1.005–1.030)
Urobilinogen, Ur: 0.2 mg/dL (ref 0.2–1.0)
pH, UA: 5.5 (ref 5.0–7.5)

## 2019-11-15 LAB — MICROSCOPIC EXAMINATION

## 2019-11-16 ENCOUNTER — Other Ambulatory Visit: Payer: Self-pay | Admitting: Family

## 2019-11-16 DIAGNOSIS — M810 Age-related osteoporosis without current pathological fracture: Secondary | ICD-10-CM | POA: Insufficient documentation

## 2019-11-16 MED ORDER — ALENDRONATE SODIUM 70 MG PO TABS: 70.0000 mg | ORAL_TABLET | ORAL | 11 refills | Status: DC

## 2019-11-17 ENCOUNTER — Telehealth: Payer: Self-pay

## 2019-11-17 NOTE — Progress Notes (Signed)
EPIC Encounter for ICM Monitoring  Patient Name: Melissa Paul is a 74 y.o. female Date: 11/17/2019 Primary Care Physican: Sharion Balloon, FNP Primary Cardiologist:[facility] Electrophysiologist: Allred Bi-V Pacing:94% 9/29/2021Weight:184lbs  AT/AF Burden:3.2%(taking Eliquis)    Attempted call to patient and unable to reach.   Transmission reviewed.     CorVueThoracic impedancetrending close to baseline normal.  Prescribed:  Furosemide80 mgTake 1 tablet (80 mg total) by mouth daily. Additional 80 mg as needed for extra fluid  Spirolactone 25 mg take 1 tablet daily  Eliquis 5 mg take 1 tablet twice a day  Labs: 08/24/2019 Creatinine 1.08, BUN 13, Potassium 3.6, Sodium 134, GFR 50-58 04/06/2019 Creatinine1.01, BUN11, Potassium3.9, Sodium134, GFR54->60 03/26/2019 Creatinine1.17, BUN9, Potassium 4.3, Sodium135, PBD57-89  03/19/2019 Creatinine1.20, BUN12, Potassium3.4, Sodium136, BOE78-41 A complete set of results can be found in Results Review.  Recommendations: Unable to reach.    Follow-up plan: ICM clinic phone appointment on11/29/2021. 91 day device clinic remote transmission12/27/2021.  EP/Cardiology Office Visits:12/14/2019 with Dr.McDowell.   Copy of ICM check sent to Dr.Allred.  11/16/2019 Direct Trend:   3 month ICM trend: 11/16/2019    1 Year ICM trend:       [name], RN 11/17/2019 10:33 AM

## 2019-11-17 NOTE — Telephone Encounter (Signed)
Remote ICM transmission received.  Attempted call to patient regarding ICM remote transmission and recording stated call cannot be completed at this time.   

## 2019-11-18 LAB — URINE CULTURE

## 2019-11-19 ENCOUNTER — Other Ambulatory Visit: Payer: Self-pay | Admitting: Family

## 2019-11-19 MED ORDER — CIPROFLOXACIN HCL 500 MG PO TABS: 500.0000 mg | ORAL_TABLET | Freq: Two times a day (BID) | ORAL | 0 refills | Status: DC

## 2019-11-22 ENCOUNTER — Other Ambulatory Visit: Payer: Self-pay | Admitting: *Deleted

## 2019-11-22 ENCOUNTER — Telehealth: Payer: Self-pay

## 2019-11-23 MED ORDER — CEFDINIR 300 MG PO CAPS: 300.0000 mg | ORAL_CAPSULE | Freq: Two times a day (BID) | ORAL | 0 refills | Status: DC

## 2019-11-23 NOTE — Telephone Encounter (Signed)
Patient notified

## 2019-11-23 NOTE — Telephone Encounter (Signed)
Cefdinir Prescription sent to pharmacy, this is very close to Ceftin that you can take.

## 2019-11-26 ENCOUNTER — Other Ambulatory Visit: Payer: Self-pay | Admitting: Cardiology

## 2019-11-26 ENCOUNTER — Other Ambulatory Visit: Payer: Self-pay | Admitting: Family

## 2019-11-26 ENCOUNTER — Other Ambulatory Visit: Payer: Self-pay | Admitting: Internal Medicine

## 2019-11-26 DIAGNOSIS — I428 Other cardiomyopathies: Secondary | ICD-10-CM

## 2019-11-26 NOTE — Telephone Encounter (Signed)
Pt's medication was sent to pt's pharmacy as requested. Confirmation received.  °

## 2019-12-08 ENCOUNTER — Encounter: Payer: Self-pay | Admitting: Internal Medicine

## 2019-12-08 ENCOUNTER — Other Ambulatory Visit: Payer: Self-pay

## 2019-12-08 ENCOUNTER — Ambulatory Visit (INDEPENDENT_AMBULATORY_CARE_PROVIDER_SITE_OTHER): Payer: Medicare Other | Admitting: Internal Medicine

## 2019-12-08 VITALS — BP 110/68 | HR 70 | Ht 62.4 in | Wt 186.0 lb

## 2019-12-08 DIAGNOSIS — I4901 Ventricular fibrillation: Secondary | ICD-10-CM

## 2019-12-08 DIAGNOSIS — D6869 Other thrombophilia: Secondary | ICD-10-CM

## 2019-12-08 DIAGNOSIS — I480 Paroxysmal atrial fibrillation: Secondary | ICD-10-CM

## 2019-12-08 DIAGNOSIS — I428 Other cardiomyopathies: Secondary | ICD-10-CM | POA: Diagnosis not present

## 2019-12-08 NOTE — Progress Notes (Signed)
PCP: Sharion Balloon, FNP Primary Cardiologist: Dr Aundra Dubin Primary EP: Dr Rogue Jury Melissa Paul is a 74 y.o. female who presents today for routine electrophysiology followup.  Since last being seen in our clinic, the patient reports doing very well.  Today, she denies symptoms of palpitations, chest pain, shortness of breath,  lower extremity edema, dizziness, presyncope, syncope, or ICD shocks.  The patient is otherwise without complaint today.   Past Medical History:  Diagnosis Date  . Anxiety   . Chronic systolic heart failure ([place])   . Constipation   . Cyst of right kidney 11/12/2017  . Glucose intolerance (pre-diabetes)   . Hyperlipemia   . Hypothyroidism   . LBBB (left bundle branch block)   . Mitral regurgitation    Mild to Moderate  . Nonischemic cardiomyopathy ([place])    LVEF 35-40% by echo 8/13  . Paroxysmal atrial fibrillation (HCC)    Brief episodes by device interrogation  . UTI (urinary tract infection)    Recurrent  . Ventricular tachycardia ([place])    ICD therapy for VT   Past Surgical History:  Procedure Laterality Date  . APPENDECTOMY    . BIV ICD GENERATOR CHANGEOUT N/A 07/11/2016   Procedure: BiV ICD Generator Changeout;  Surgeon: Thompson Grayer, MD;  Location: [facility];  Service: Cardiovascular;  Laterality: N/A;  . CARDIAC DEFIBRILLATOR PLACEMENT  09/2009   St. Jude BiV ICD by Dr. Sandria Manly class III  . CHOLECYSTECTOMY    . has one ovary left    . KNEE ARTHROSCOPY     Left  . LEFT AND RIGHT HEART CATHETERIZATION WITH CORONARY ANGIOGRAM N/A 04/12/2014   Procedure: LEFT AND RIGHT HEART CATHETERIZATION WITH CORONARY ANGIOGRAM;  Surgeon: Leonie Man, MD;  Location: [facility] CATH LAB;  Service: Cardiovascular;  Laterality: N/A;  . Left breast biopsy x 2     no maliganancy  . RIGHT/LEFT HEART CATH AND CORONARY ANGIOGRAPHY N/A 03/26/2019   Procedure: RIGHT/LEFT HEART CATH AND CORONARY ANGIOGRAPHY;  Surgeon: [name], MD;  Location: [facility]  CV LAB;  Service: Cardiovascular;  Laterality: N/A;  . TOTAL ABDOMINAL HYSTERECTOMY     (R) ovary removed    ROS- all systems are reviewed and negative except as per HPI above  Current Outpatient Medications  Medication Sig Dispense Refill  . alendronate (FOSAMAX) 70 MG tablet Take 1 tablet (70 mg total) by mouth every 7 (seven) days. Take with a full glass of water on an empty stomach. 4 tablet 11  . apixaban (ELIQUIS) 5 MG TABS tablet Take 1 tablet (5 mg total) by mouth 2 (two) times daily. 60 tablet 6  . blood glucose meter kit and supplies Dispense based on patient and insurance preference. Use up to four times daily as directed. (FOR ICD-10 E10.9, E11.9). 1 each 0  . carvedilol (COREG) 6.25 MG tablet TAKE 3 TABLETS BY MOUTH TWICE DAILY 180 tablet 5  . cefdinir (OMNICEF) 300 MG capsule Take 1 capsule (300 mg total) by mouth 2 (two) times daily. 1 po BID 20 capsule 0  . digoxin (LANOXIN) 0.125 MG tablet Take 0.0625 mg by mouth daily.    Arna Medici 88 MCG tablet TAKE 1 TABLET BY MOUTH ONCE DAILY BEFORE BREAKFAST 30 tablet 0  . furosemide (LASIX) 80 MG tablet Take 1 tablet (80 mg total) by mouth daily. Additional 80 mg as needed for extra fluid 90 tablet 1  . hydroxypropyl methylcellulose / hypromellose (ISOPTO TEARS / GONIOVISC) 2.5 % ophthalmic  solution Place 1 drop into both eyes 2 (two) times daily as needed for dry eyes.    . hydrOXYzine (VISTARIL) 25 MG capsule Take 1 capsule (25 mg total) by mouth 3 (three) times daily as needed. 90 capsule 2  . metFORMIN (GLUCOPHAGE XR) 750 MG 24 hr tablet Take 2 tablets (1,500 mg total) by mouth daily with breakfast. 180 tablet 2  . nystatin (MYCOSTATIN) 100000 UNIT/ML suspension Take 5 mLs (500,000 Units total) by mouth 4 (four) times daily. 473 mL 1  . pantoprazole (PROTONIX) 40 MG tablet Take 1 tablet (40 mg total) by mouth daily before breakfast. 90 tablet 2  . spironolactone (ALDACTONE) 25 MG tablet Take 1 tablet (25 mg total) by mouth at  bedtime. 90 tablet 3  . triamcinolone ointment (KENALOG) 0.5 % Apply 1 application topically 2 (two) times daily. 60 g 2   No current facility-administered medications for this visit.    Physical Exam: Vitals:   12/08/19 1149  BP: 110/68  Pulse: 70  SpO2: 97%  Weight: 186 lb (84.4 kg)  Height: 5' 2.4" (1.585 m)    GEN- The patient is well appearing, alert and oriented x 3 today.   Head- normocephalic, atraumatic Eyes-  Sclera clear, conjunctiva pink Ears- hearing intact Oropharynx- clear Lungs- Clear to ausculation bilaterally, normal work of breathing Chest- ICD pocket is well healed Heart- Regular rate and rhythm, no murmurs, rubs or gallops, PMI not laterally displaced GI- soft, NT, ND, + BS Extremities- no clubbing, cyanosis, or edema  ICD interrogation- reviewed in detail today,  See PACEART report  ekg tracing ordered today is personally reviewed and shows AV paced  Wt Readings from Last 3 Encounters:  12/08/19 186 lb (84.4 kg)  10/13/19 184 lb 6.4 oz (83.6 kg)  09/10/19 184 lb (83.5 kg)    Assessment and Plan:  1.  Chronic systolic dysfunction/ nonischemic CM euvolemic today Stable on an appropriate medical regimen Normal ICD function See Pace Art report No changes today she is not device dependant today followed in ICM device clinic Check dig level today bmet We discussed barostim activation therapy (Barostim Neo).  This therapy is now also approved for patients who have not responded to CRT.  She is willing to consider this but would like to discuss with Dr Aundra Dubin.  2. VT/VF Normal ICD function Currently controlled  3. afib Well controlled On eliquis for chads2vasc score of 6 Bmet, cbc today  Risks, benefits and potential toxicities for medications prescribed and/or refilled reviewed with patient today.   Thompson Grayer MD, [facility] 12/08/2019 11:56 AM

## 2019-12-08 NOTE — Patient Instructions (Signed)
Medication Instructions:  Your physician recommends that you continue on your current medications as directed. Please refer to the Current Medication list given to you today.  *If you need a refill on your cardiac medications before your next appointment, please call your pharmacy*  Lab Work: BMP, CBC, Dig level,   If you have labs (blood work) drawn today and your tests are completely normal, you will receive your results only by: Marland Kitchen MyChart Message (if you have MyChart) OR . A paper copy in the mail If you have any lab test that is abnormal or we need to change your treatment, we will call you to review the results.  Testing/Procedures: None ordered.  Follow-Up: At [facility], you and your health needs are our priority.  As part of our continuing mission to provide you with exceptional heart care, we have created designated Provider Care Teams.  These Care Teams include your primary Cardiologist (physician) and Advanced Practice Providers (APPs -  Physician Assistants and Nurse Practitioners) who all work together to provide you with the care you need, when you need it.  We recommend signing up for the patient portal called "MyChart".  Sign up information is provided on this After Visit Summary.  MyChart is used to connect with patients for Virtual Visits (Telemedicine).  Patients are able to view lab/test results, encounter notes, upcoming appointments, etc.  Non-urgent messages can be sent to your provider as well.   To learn more about what you can do with MyChart, go to [email or web address].    Your next appointment:   Your physician wants you to follow-up in: 6 months with [name] and then 1 year with Dr. [name]. You will receive a reminder letter in the mail two months in advance. If you don't receive a letter, please call our office to schedule the follow-up appointment.  Remote monitoring is used to monitor your ICD from home. This monitoring reduces the number of office  visits required to check your device to one time per year. It allows [place] to keep an eye on the functioning of your device to ensure it is working properly. You are scheduled for a device check from home on 12/20/19. You may send your transmission at any time that day. If you have a wireless device, the transmission will be sent automatically. After your physician reviews your transmission, you will receive a postcard with your next transmission date.  Other Instructions:

## 2019-12-09 LAB — BASIC METABOLIC PANEL
BUN/Creatinine Ratio: 12 (ref 12–28)
BUN: 14 mg/dL (ref 8–27)
CO2: 26 mmol/L (ref 20–29)
Calcium: 9.4 mg/dL (ref 8.7–10.3)
Chloride: 95 mmol/L — ABNORMAL LOW (ref 96–106)
Creatinine, Ser: 1.15 mg/dL — ABNORMAL HIGH (ref 0.57–1.00)
GFR calc Af Amer: 53 mL/min/{1.73_m2} — ABNORMAL LOW (ref >59)
GFR calc non Af Amer: 46 mL/min/{1.73_m2} — ABNORMAL LOW (ref >59)
Glucose: 235 mg/dL — ABNORMAL HIGH (ref 65–99)
Potassium: 4 mmol/L (ref 3.5–5.2)
Sodium: 135 mmol/L (ref 134–144)

## 2019-12-09 LAB — CBC
Hematocrit: 36.1 % (ref 34.0–46.6)
Hemoglobin: 12.5 g/dL (ref 11.1–15.9)
MCH: 30.9 pg (ref 26.6–33.0)
MCHC: 34.6 g/dL (ref 31.5–35.7)
MCV: 89 fL (ref 79–97)
Platelets: 255 10*3/uL (ref 150–450)
RBC: 4.05 x10E6/uL (ref 3.77–5.28)
RDW: 13 % (ref 11.7–15.4)
WBC: 7.9 10*3/uL (ref 3.4–10.8)

## 2019-12-09 LAB — DIGOXIN LEVEL: Digoxin, Serum: 0.8 ng/mL (ref 0.5–0.9)

## 2019-12-14 ENCOUNTER — Ambulatory Visit: Payer: Medicare Other | Admitting: Cardiology

## 2019-12-20 ENCOUNTER — Ambulatory Visit (INDEPENDENT_AMBULATORY_CARE_PROVIDER_SITE_OTHER): Payer: Medicare Other

## 2019-12-20 DIAGNOSIS — I5022 Chronic systolic (congestive) heart failure: Secondary | ICD-10-CM | POA: Diagnosis not present

## 2019-12-20 DIAGNOSIS — Z95810 Presence of automatic (implantable) cardiac defibrillator: Secondary | ICD-10-CM | POA: Diagnosis not present

## 2019-12-21 ENCOUNTER — Telehealth: Payer: Self-pay

## 2019-12-21 NOTE — Progress Notes (Addendum)
EPIC Encounter for ICM Monitoring  Patient Name: Melissa Paul is a 74 y.o. female Date: 12/21/2019 Primary Care Physican: Sharion Balloon, FNP Primary Cardiologist:McDowell/McLean Electrophysiologist: Allred Bi-V Pacing:90% 9/29/2021Weight:184lbs  AT/AF Burden:4.9%(taking Eliquis)    Attempted call to patient and unable to reach.   Transmission reviewed.    CorVueThoracic impedancenormal but suggesting possible fluid accumulation from 11/18-11/25.    Prescribed:  Furosemide80 mgTake 1 tablet (80 mg total) by mouth daily. Additional 80 mg as needed for extra fluid  Spirolactone 25 mg take 1 tablet daily  Eliquis 5 mg take 1 tablet twice a day  Labs: 08/24/2019 Creatinine 1.08, BUN 13, Potassium 3.6, Sodium 134, GFR 50-58 04/06/2019 Creatinine1.01, BUN11, Potassium3.9, Sodium134, GFR54->60 03/26/2019 Creatinine1.17, BUN9, Potassium 4.3, Sodium135, VEX46-00  03/19/2019 Creatinine1.20, BUN12, Potassium3.4, Sodium136, GBK47-30 A complete set of results can be found in Results Review.  Recommendations: Unable to reach.    Follow-up plan: ICM clinic phone appointment on1/03/2020. 91 day device clinic remote transmission12/27/2021.  EP/Cardiology Office Visits:02/02/2020 with Dr.McDowell.   Copy of ICM check sent to Dr.Allred.   3 month ICM trend: 12/20/2019    1 Year ICM trend:       [name], RN 12/21/2019 3:36 PM

## 2019-12-21 NOTE — Telephone Encounter (Signed)
Remote ICM transmission received.  Attempted call to patient regarding ICM remote transmission and left detailed message per DPR.  Advised to return call for any fluid symptoms or questions. Next ICM remote transmission scheduled 01/24/2020.

## 2019-12-26 ENCOUNTER — Other Ambulatory Visit: Payer: Self-pay | Admitting: Cardiology

## 2019-12-26 ENCOUNTER — Other Ambulatory Visit: Payer: Self-pay | Admitting: Family

## 2020-01-07 ENCOUNTER — Other Ambulatory Visit: Payer: Self-pay | Admitting: Family

## 2020-01-07 DIAGNOSIS — G4700 Insomnia, unspecified: Secondary | ICD-10-CM

## 2020-01-07 DIAGNOSIS — F411 Generalized anxiety disorder: Secondary | ICD-10-CM

## 2020-01-17 ENCOUNTER — Ambulatory Visit (INDEPENDENT_AMBULATORY_CARE_PROVIDER_SITE_OTHER): Payer: Medicare Other

## 2020-01-17 DIAGNOSIS — I428 Other cardiomyopathies: Secondary | ICD-10-CM | POA: Diagnosis not present

## 2020-01-17 DIAGNOSIS — I5022 Chronic systolic (congestive) heart failure: Secondary | ICD-10-CM

## 2020-01-18 LAB — CUP PACEART REMOTE DEVICE CHECK
Battery Remaining Longevity: 36 mo
Battery Remaining Percentage: 46 %
Battery Voltage: 2.92 V
Brady Statistic AP VP Percent: 75 %
Brady Statistic AP VS Percent: 1.7 %
Brady Statistic AS VP Percent: 16 %
Brady Statistic AS VS Percent: 1.1 %
Brady Statistic RA Percent Paced: 68 %
Date Time Interrogation Session: 20211227194308
HighPow Impedance: 77 Ohm
HighPow Impedance: 77 Ohm
Implantable Lead Implant Date: 20110912
Implantable Lead Implant Date: 20110912
Implantable Lead Implant Date: 20120710
Implantable Lead Location: 753858
Implantable Lead Location: 753859
Implantable Lead Location: 753860
Implantable Lead Model: 7122
Implantable Pulse Generator Implant Date: 20180621
Lead Channel Impedance Value: 340 Ohm
Lead Channel Impedance Value: 480 Ohm
Lead Channel Impedance Value: 580 Ohm
Lead Channel Pacing Threshold Amplitude: 0.5 V
Lead Channel Pacing Threshold Amplitude: 0.625 V
Lead Channel Pacing Threshold Amplitude: 0.75 V
Lead Channel Pacing Threshold Pulse Width: 0.5 ms
Lead Channel Pacing Threshold Pulse Width: 0.5 ms
Lead Channel Pacing Threshold Pulse Width: 0.5 ms
Lead Channel Sensing Intrinsic Amplitude: 12 mV
Lead Channel Sensing Intrinsic Amplitude: 4.7 mV
Lead Channel Setting Pacing Amplitude: 2 V
Lead Channel Setting Pacing Amplitude: 2 V
Lead Channel Setting Pacing Amplitude: 2 V
Lead Channel Setting Pacing Pulse Width: 0.5 ms
Lead Channel Setting Pacing Pulse Width: 0.5 ms
Lead Channel Setting Sensing Sensitivity: 0.5 mV
Pulse Gen Serial Number: 9761374

## 2020-01-23 ENCOUNTER — Other Ambulatory Visit: Payer: Self-pay | Admitting: Family

## 2020-01-24 ENCOUNTER — Ambulatory Visit (INDEPENDENT_AMBULATORY_CARE_PROVIDER_SITE_OTHER): Payer: Medicare Other

## 2020-01-24 DIAGNOSIS — Z95810 Presence of automatic (implantable) cardiac defibrillator: Secondary | ICD-10-CM | POA: Diagnosis not present

## 2020-01-24 DIAGNOSIS — I5022 Chronic systolic (congestive) heart failure: Secondary | ICD-10-CM

## 2020-01-26 NOTE — Progress Notes (Signed)
EPIC Encounter for ICM Monitoring  Patient Name: Melissa Paul is a 75 y.o. female Date: 01/26/2020 Primary Care Physican: Junie Spencer, FNP Primary Cardiologist:McDowell/McLean Electrophysiologist: Allred Bi-V Pacing:90% 01/26/2020 Weight:184lbs  AT/AF Burden:5.3%(taking Eliquis)    Spoke with patient and reports no changes in condition.  She chronically has SOB and leg swelling and it has not been any worse in the last week.  CorVueThoracic impedancesuggesting possible fluid accumulation starting 01/21/2020 and trending back to baseline.    Prescribed:  Furosemide80 mgTake 1 tablet (80 mg total) by mouth daily. Additional 80 mg as needed for extra fluid  Spirolactone 25 mg take 1 tablet daily  Eliquis 5 mg take 1 tablet twice a day  Labs: 08/24/2019 Creatinine 1.08, BUN 13, Potassium 3.6, Sodium 134, GFR 50-58 04/06/2019 Creatinine1.01, BUN11, Potassium3.9, Sodium134, GFR54->60 03/26/2019 Creatinine1.17, BUN9, Potassium 4.3, Sodium135, [id number]  03/19/2019 Creatinine1.20, BUN12, Potassium3.4, Sodium136, [id number] A complete set of results can be found in Results Review.  Recommendations:Advised to take extra 1/2 tablet Furosemide 1 tablet x 1 day and then resume prescribed dosage.  Follow-up plan: ICM clinic phone appointment on2/08/2020. 91 day device clinic remote transmission3/28/2022.  EP/Cardiology Office Visits:02/02/2020 with Dr.McDowell.   Copy of ICM check sent to Dr.Allred and Dr Diona Browner since patient has 02/02/2020 appointment.    3 month ICM trend: 01/24/2020.    1 Year ICM trend:       Karie Soda, RN 01/26/2020 4:53 PM

## 2020-01-27 NOTE — Progress Notes (Signed)
Remote ICD transmission.   

## 2020-01-31 ENCOUNTER — Other Ambulatory Visit: Payer: Self-pay | Admitting: Cardiology

## 2020-01-31 LAB — BASIC METABOLIC PANEL WITH GFR
BUN/Creatinine Ratio: 13 (calc) (ref 6–22)
BUN: 13 mg/dL (ref 7–25)
CO2: 28 mmol/L (ref 20–32)
Calcium: 9.3 mg/dL (ref 8.6–10.4)
Chloride: 101 mmol/L (ref 98–110)
Creat: 0.98 mg/dL — ABNORMAL HIGH (ref 0.60–0.93)
GFR, Est African American: 65 mL/min/{1.73_m2} (ref >=60)
GFR, Est Non African American: 56 mL/min/{1.73_m2} — ABNORMAL LOW (ref >=60)
Glucose, Bld: 172 mg/dL — ABNORMAL HIGH (ref 65–139)
Potassium: 4.3 mmol/L (ref 3.5–5.3)
Sodium: 138 mmol/L (ref 135–146)

## 2020-01-31 LAB — CBC
HCT: 34.9 % — ABNORMAL LOW (ref 35.0–45.0)
Hemoglobin: 11.7 g/dL (ref 11.7–15.5)
MCH: 29.7 pg (ref 27.0–33.0)
MCHC: 33.5 g/dL (ref 32.0–36.0)
MCV: 88.6 fL (ref 80.0–100.0)
MPV: 10.2 fL (ref 7.5–12.5)
Platelets: 251 10*3/uL (ref 140–400)
RBC: 3.94 10*6/uL (ref 3.80–5.10)
RDW: 13.3 % (ref 11.0–15.0)
WBC: 6 10*3/uL (ref 3.8–10.8)

## 2020-02-02 ENCOUNTER — Encounter: Payer: Self-pay | Admitting: Cardiology

## 2020-02-02 ENCOUNTER — Ambulatory Visit (INDEPENDENT_AMBULATORY_CARE_PROVIDER_SITE_OTHER): Payer: Medicare Other | Admitting: Cardiology

## 2020-02-02 VITALS — BP 108/58 | HR 72 | Ht 62.0 in | Wt 185.0 lb

## 2020-02-02 DIAGNOSIS — I480 Paroxysmal atrial fibrillation: Secondary | ICD-10-CM | POA: Diagnosis not present

## 2020-02-02 DIAGNOSIS — I5022 Chronic systolic (congestive) heart failure: Secondary | ICD-10-CM | POA: Diagnosis not present

## 2020-02-02 NOTE — Patient Instructions (Addendum)
Medication Instructions:   Your physician recommends that you continue on your current medications as directed. Please refer to the Current Medication list given to you today.  Labwork:  none  Testing/Procedures:  none  Follow-Up:  Your physician recommends that you schedule a follow-up appointment in: 3 months.  Any Other Special Instructions Will Be Listed Below (If Applicable).  If you need a refill on your cardiac medications before your next appointment, please call your pharmacy. 

## 2020-02-02 NOTE — Progress Notes (Signed)
Cardiology Office Note  Date: 02/02/2020   ID: DELORSE Paul, DOB 18-Apr-1945, MRN [id number]  PCP:  Sharion Balloon, FNP  Cardiologist:  Rozann Lesches, MD Electrophysiologist:  Thompson Grayer, MD   Chief Complaint  Patient presents with  . Cardiac follow-up    History of Present Illness: Melissa Paul is a 75 y.o. female last assessed via telehealth encounter in July 2021.  She is here today with her daughter for a follow-up visit.  Reports no major change in status, weight has been stable.  She does adjust diuretics as needed for leg swelling.  She follows with Dr. [name], [employer]. Jude biventricular ICD in place.  Recent thoracic impedance indicated possible fluid accumulation beginning late December and trending toward baseline.  AT/AF burden 5.3%.  No progressive palpitations.  I reviewed her recent lab work as noted below.  I also went over her medications which she is tolerating well.  Past Medical History:  Diagnosis Date  . Anxiety   . Chronic systolic heart failure ([place])   . Constipation   . Cyst of right kidney 11/12/2017  . Glucose intolerance (pre-diabetes)   . Hyperlipemia   . Hypothyroidism   . LBBB (left bundle branch block)   . Mitral regurgitation    Mild to Moderate  . Nonischemic cardiomyopathy ([place])    LVEF 35-40% by echo 8/13  . Paroxysmal atrial fibrillation (H[facility])    Brief episodes by device interrogation  . UTI (urinary tract infection)    Recurrent  . Ventricular tachycardia ([place])    ICD therapy for VT    Past Surgical History:  Procedure Laterality Date  . APPENDECTOMY    . BIV ICD GENERATOR CHANGEOUT N/A 07/11/2016   Procedure: BiV ICD Generator Changeout;  Surgeon: Thompson Grayer, MD;  Location: [facility];  Service: Cardiovascular;  Laterality: N/A;  . CARDIAC DEFIBRILLATOR PLACEMENT  09/2009   St. Jude BiV ICD by Dr. Sandria Manly class III  . CHOLECYSTECTOMY    . has one ovary left    . KNEE ARTHROSCOPY     Left  . LEFT AND  RIGHT HEART CATHETERIZATION WITH CORONARY ANGIOGRAM N/A 04/12/2014   Procedure: LEFT AND RIGHT HEART CATHETERIZATION WITH CORONARY ANGIOGRAM;  Surgeon: Leonie Man, MD;  Location: [facility];  Service: Cardiovascular;  Laterality: N/A;  . Left breast biopsy x 2     no maliganancy  . RIGHT/LEFT HEART CATH AND CORONARY ANGIOGRAPHY N/A 03/26/2019   Procedure: RIGHT/LEFT HEART CATH AND CORONARY ANGIOGRAPHY;  Surgeon: [name], MD;  Location: [facility];  Service: Cardiovascular;  Laterality: N/A;  . TOTAL ABDOMINAL HYSTERECTOMY     (R) ovary removed    Current Outpatient Medications  Medication Sig Dispense Refill  . apixaban (ELIQUIS) 5 MG TABS tablet Take 1 tablet (5 mg total) by mouth 2 (two) times daily. 60 tablet 6  . blood glucose meter kit and supplies Dispense based on patient and insurance preference. Use up to four times daily as directed. (FOR ICD-10 E10.9, E11.9). 1 each 0  . carvedilol (COREG) 6.25 MG tablet TAKE 3 TABLETS BY MOUTH TWICE DAILY 180 tablet 5  . digoxin (LANOXIN) 0.125 MG tablet Take 0.0625 mg by mouth daily.    . furosemide (LASIX) 80 MG tablet TAKE 1 TABLET BY MOUTH ONCE DAILY AS NEEDED FOR  EXTRA  FLUID 90 tablet 1  . hydroxypropyl methylcellulose / hypromellose (ISOPTO TEARS / GONIOVISC) 2.5 % ophthalmic solution Place 1 drop into  both eyes 2 (two) times daily as needed for dry eyes.    . hydrOXYzine (VISTARIL) 25 MG capsule Take 1 capsule by mouth three times daily as needed 90 capsule 0  . levothyroxine (EUTHYROX) 88 MCG tablet Take 1 tablet (88 mcg total) by mouth daily before breakfast. (needs labwork) 30 tablet 0  . metFORMIN (GLUCOPHAGE XR) 750 MG 24 hr tablet Take 2 tablets (1,500 mg total) by mouth daily with breakfast. 180 tablet 2  . nystatin (MYCOSTATIN) 100000 UNIT/ML suspension Take 5 mLs (500,000 Units total) by mouth 4 (four) times daily. 473 mL 1  . pantoprazole (PROTONIX) 40 MG tablet Take 1 tablet (40 mg total) by mouth daily before  breakfast. 90 tablet 2  . spironolactone (ALDACTONE) 25 MG tablet Take 1 tablet (25 mg total) by mouth at bedtime. 90 tablet 3  . triamcinolone ointment (KENALOG) 0.5 % Apply 1 application topically 2 (two) times daily. 60 g 2   No current facility-administered medications for this visit.   Allergies:  Buspar [buspirone], Lexapro [escitalopram oxalate], Bactrim, Cephalexin, Chlorhexidine gluconate, Clindamycin, Contrast media [iodinated diagnostic agents], Doxycycline, Latex, Levofloxacin, Nitrofurantoin monohyd macro, and Penicillins   ROS: No palpitations or syncope.  Using a walker.  Physical Exam: VS:  BP (!) 108/58   Pulse 72   Ht '5\' 2"'  (1.575 m)   Wt 185 lb (83.9 kg)   SpO2 97%   BMI 33.84 kg/m , BMI Body mass index is 33.84 kg/m.  Wt Readings from Last 3 Encounters:  02/02/20 185 lb (83.9 kg)  12/08/19 186 lb (84.4 kg)  10/13/19 184 lb 6.4 oz (83.6 kg)    General: Elderly woman, appears comfortable at rest. HEENT: Conjunctiva and lids normal, wearing a mask. Neck: Supple, no elevated JVP or carotid bruits, no thyromegaly. Lungs: Clear to auscultation, nonlabored breathing at rest. Cardiac: Regular rate and rhythm, no S3 or significant systolic murmur. Extremities: Trace ankle edema.  ECG:  An ECG dated 12/08/2019 was personally reviewed today and demonstrated:  Dual-chamber paced rhythm.  Recent Labwork: 02/08/2019: Magnesium 1.7 01/31/2020: BUN 13; Creat 0.98; Hemoglobin 11.7; Platelets 251; Potassium 4.3; Sodium 138     Component Value Date/Time   CHOL 156 10/19/2018 1133   TRIG 89 10/19/2018 1133   HDL 45 10/19/2018 1133   CHOLHDL 3.5 10/19/2018 1133   CHOLHDL 3.3 04/11/2007 0600   VLDL 10 04/11/2007 0600   LDLCALC 94 10/19/2018 1133  November 2021: Digoxin level 0.8  Other Studies Reviewed Today:  Cardiac catheterization 03/26/2019: RA mean 1 RV 26/3 PA 20/5, mean 11 PCWP mean 9 LV 100/7 AO 106/46  Oxygen saturations: PA 78% AO 99%  Cardiac Output  (Fick) 6.92  Cardiac Index (Fick) 3.7  1. Normal filling pressures.  2. Preserved cardiac output.  3. No significant coronary disease noted.   Exertional symptoms not explained by this study.  Echocardiogram 08/11/2019: 1. Left ventricular ejection fraction, by estimation, is approximately  35%. The left ventricle demonstrates regional wall motion abnormalities  (see scoring diagram/findings for description). The left ventricular  internal cavity size was moderately  dilated. Left ventricular diastolic parameters are consistent with Grade I  diastolic dysfunction (impaired relaxation).  2. Right ventricular systolic function is normal. The right ventricular  size is normal. A device wire is visualized. There is normal pulmonary  artery systolic pressure. The estimated right ventricular systolic  pressure is 85.2 mmHg.  3. Left atrial size was mildly dilated.  4. The mitral valve is abnormal, mildly thickened and calcified.  Mild to  moderate mitral valve regurgitation.  5. The aortic valve is tricuspid. Aortic valve regurgitation is trivial.  6. The inferior vena cava is normal in size with greater than 50%  respiratory variability, suggesting right atrial pressure of 3 mmHg.   Assessment and Plan:  1. Nonischemic cardiomyopathy with chronic systolic heart failure.  Weight is stable and fluid status looks to be reasonably well-controlled at this time on current diuretic dose.  Plan to continue Coreg, Lanoxin, Aldactone, and Lasix.  Low blood pressure limits addition of ARB/ANRI.  2.  Paroxysmal atrial fibrillation with low rhythm burden by device interrogation.  She is on Eliquis for stroke prophylaxis.  No sense of palpitations.  Medication Adjustments/Labs and Tests Ordered: Current medicines are reviewed at length with the patient today.  Concerns regarding medicines are outlined above.   Tests Ordered: No orders of the defined types were placed in this  encounter.   Medication Changes: No orders of the defined types were placed in this encounter.   Disposition:  Follow up 3 months in the [place] office.  Signed, Satira Sark, MD, [facility] 02/02/2020 10:21 AM    [facility] [place], [place], [place] [address] Phone: [phone]; Fax: [phone]

## 2020-02-22 ENCOUNTER — Emergency Department (HOSPITAL_COMMUNITY)
Admission: EM | Admit: 2020-02-22 | Discharge: 2020-02-22 | Disposition: A | Discharge status: Home / Self Care | Payer: Medicare Other | Attending: Emergency Medicine | Admitting: Emergency Medicine

## 2020-02-22 ENCOUNTER — Encounter (HOSPITAL_COMMUNITY): Payer: Self-pay

## 2020-02-22 ENCOUNTER — Other Ambulatory Visit: Payer: Self-pay

## 2020-02-22 DIAGNOSIS — N390 Urinary tract infection, site not specified: Secondary | ICD-10-CM | POA: Diagnosis not present

## 2020-02-22 DIAGNOSIS — Z7901 Long term (current) use of anticoagulants: Secondary | ICD-10-CM | POA: Diagnosis not present

## 2020-02-22 DIAGNOSIS — Z79899 Other long term (current) drug therapy: Secondary | ICD-10-CM | POA: Insufficient documentation

## 2020-02-22 DIAGNOSIS — I5022 Chronic systolic (congestive) heart failure: Secondary | ICD-10-CM | POA: Insufficient documentation

## 2020-02-22 DIAGNOSIS — Z91040 Latex allergy status: Secondary | ICD-10-CM | POA: Diagnosis not present

## 2020-02-22 DIAGNOSIS — R110 Nausea: Secondary | ICD-10-CM | POA: Diagnosis not present

## 2020-02-22 DIAGNOSIS — Z950 Presence of cardiac pacemaker: Secondary | ICD-10-CM | POA: Insufficient documentation

## 2020-02-22 DIAGNOSIS — Z87891 Personal history of nicotine dependence: Secondary | ICD-10-CM | POA: Insufficient documentation

## 2020-02-22 DIAGNOSIS — E039 Hypothyroidism, unspecified: Secondary | ICD-10-CM | POA: Insufficient documentation

## 2020-02-22 DIAGNOSIS — Z7984 Long term (current) use of oral hypoglycemic drugs: Secondary | ICD-10-CM | POA: Diagnosis not present

## 2020-02-22 DIAGNOSIS — R109 Unspecified abdominal pain: Secondary | ICD-10-CM | POA: Diagnosis present

## 2020-02-22 DIAGNOSIS — R7303 Prediabetes: Secondary | ICD-10-CM | POA: Diagnosis not present

## 2020-02-22 LAB — URINALYSIS, ROUTINE W REFLEX MICROSCOPIC
Bilirubin Urine: NEGATIVE
Glucose, UA: NEGATIVE mg/dL
Hgb urine dipstick: NEGATIVE
Ketones, ur: NEGATIVE mg/dL
Nitrite: NEGATIVE
Protein, ur: NEGATIVE mg/dL
Specific Gravity, Urine: 1.017 (ref 1.005–1.030)
WBC, UA: >50 WBC/hpf — ABNORMAL HIGH (ref 0–5)
pH: 5 (ref 5.0–8.0)

## 2020-02-22 MED ORDER — CEFDINIR 300 MG PO CAPS: 300.0000 mg | ORAL_CAPSULE | Freq: Two times a day (BID) | ORAL | 0 refills | Status: AC

## 2020-02-22 NOTE — ED Provider Notes (Signed)
[facility] EMERGENCY DEPARTMENT Provider Note  CSN: [id number] Arrival date & time: 02/22/20 0813    History Chief Complaint  Patient presents with  . Flank Pain    HPI  Melissa Paul is a 75 y.o. female with history of recurrent UTI reports she has had 2 weeks of flank pain, foul smelling urine and a pressure sensation in her perineum, all similar to previous UTI. She reports her urine appears green. She has a low grade fever 'all the time' and nausea, but no vomiting, diarrhea or constipation. She has been waiting about 6 months for a referral to Urology from her PCP. She has had her appendix and gall bladder removed.    Past Medical History:  Diagnosis Date  . Anxiety   . Chronic systolic heart failure ([place])   . Constipation   . Cyst of right kidney 11/12/2017  . Glucose intolerance (pre-diabetes)   . Hyperlipemia   . Hypothyroidism   . LBBB (left bundle branch block)   . Mitral regurgitation    Mild to Moderate  . Nonischemic cardiomyopathy ([place])    LVEF 35-40% by echo 8/13  . Paroxysmal atrial fibrillation (HCC)    Brief episodes by device interrogation  . UTI (urinary tract infection)    Recurrent  . Ventricular tachycardia ([place])    ICD therapy for VT    Past Surgical History:  Procedure Laterality Date  . APPENDECTOMY    . BIV ICD GENERATOR CHANGEOUT N/A 07/11/2016   Procedure: BiV ICD Generator Changeout;  Surgeon: Thompson Grayer, MD;  Location: [place] [facility];  Service: Cardiovascular;  Laterality: N/A;  . CARDIAC DEFIBRILLATOR PLACEMENT  09/2009   St. Jude BiV ICD by Dr. Sandria Manly class III  . CHOLECYSTECTOMY    . has one ovary left    . KNEE ARTHROSCOPY     Left  . LEFT AND RIGHT HEART CATHETERIZATION WITH CORONARY ANGIOGRAM N/A 04/12/2014   Procedure: LEFT AND RIGHT HEART CATHETERIZATION WITH CORONARY ANGIOGRAM;  Surgeon: Leonie Man, MD;  Location: [facility] CATH LAB;  Service: Cardiovascular;  Laterality: N/A;  . Left breast biopsy x 2     no  maliganancy  . PACEMAKER IMPLANT    . RIGHT/LEFT HEART CATH AND CORONARY ANGIOGRAPHY N/A 03/26/2019   Procedure: RIGHT/LEFT HEART CATH AND CORONARY ANGIOGRAPHY;  Surgeon: [name], MD;  Location: [facility];  Service: Cardiovascular;  Laterality: N/A;  . TOTAL ABDOMINAL HYSTERECTOMY     (R) ovary removed    Family History  Problem Relation Age of Onset  . CVA Mother        multiple  . Heart Problems Mother        "weak heart"  . Heart disease Maternal Grandmother   . Heart disease Maternal Grandfather   . Cancer Father        Stomach and eshpogus  . Diabetes Neg Hx   . Hypertension Neg Hx   . Coronary artery disease Neg Hx     Social History   Tobacco Use  . Smoking status: Former Smoker    Packs/day: 0.30    Years: 20.00    Pack years: 6.00    Types: Cigarettes    Start date: 08/03/1949    Quit date: 01/22/1996    Years since quitting: 24.1  . Smokeless tobacco: Never Used  Vaping Use  . Vaping Use: Never used  Substance Use Topics  . Alcohol use: No    Alcohol/week: 0.0 standard drinks  .  Drug use: No     Home Medications Prior to Admission medications   Medication Sig Start Date End Date Taking? Authorizing Provider  apixaban (ELIQUIS) 5 MG TABS tablet Take 1 tablet (5 mg total) by mouth 2 (two) times daily. 08/13/19   Satira Sark, MD  blood glucose meter kit and supplies Dispense based on patient and insurance preference. Use up to four times daily as directed. (FOR ICD-10 E10.9, E11.9). 10/13/19   Evelina Dun A, FNP  carvedilol (COREG) 6.25 MG tablet TAKE 3 TABLETS BY MOUTH TWICE DAILY 11/26/19   Allred, Jeneen Rinks, MD  digoxin (LANOXIN) 0.125 MG tablet Take 0.0625 mg by mouth daily.    [provider]  furosemide (LASIX) 80 MG tablet TAKE 1 TABLET BY MOUTH ONCE DAILY AS NEEDED FOR  EXTRA  FLUID 12/27/19   Satira Sark, MD  hydroxypropyl methylcellulose / hypromellose (ISOPTO TEARS / GONIOVISC) 2.5 % ophthalmic solution Place 1 drop  into both eyes 2 (two) times daily as needed for dry eyes.    [provider]  hydrOXYzine (VISTARIL) 25 MG capsule Take 1 capsule by mouth three times daily as needed 01/07/20   Sharion Balloon, FNP  levothyroxine (EUTHYROX) 88 MCG tablet Take 1 tablet (88 mcg total) by mouth daily before breakfast. (needs labwork) 01/24/20   Sharion Balloon, FNP  metFORMIN (GLUCOPHAGE XR) 750 MG 24 hr tablet Take 2 tablets (1,500 mg total) by mouth daily with breakfast. 10/13/19   Evelina Dun A, FNP  nystatin (MYCOSTATIN) 100000 UNIT/ML suspension Take 5 mLs (500,000 Units total) by mouth 4 (four) times daily. 10/13/19   Sharion Balloon, FNP  pantoprazole (PROTONIX) 40 MG tablet Take 1 tablet (40 mg total) by mouth daily before breakfast. 10/13/19   Evelina Dun A, FNP  spironolactone (ALDACTONE) 25 MG tablet Take 1 tablet (25 mg total) by mouth at bedtime. 03/19/19   [name], MD  triamcinolone ointment (KENALOG) 0.5 % Apply 1 application topically 2 (two) times daily. 10/13/19   Sharion Balloon, FNP     Allergies    Buspar [buspirone], Lexapro [escitalopram oxalate], Bactrim, Cephalexin, Chlorhexidine gluconate, Clindamycin, Contrast media [iodinated diagnostic agents], Doxycycline, Latex, Levofloxacin, Nitrofurantoin monohyd macro, and Penicillins   Review of Systems   Review of Systems A comprehensive review of systems was completed and negative except as noted in HPI.    Physical Exam BP 126/61 (BP Location: Left Arm)   Pulse 87   Temp 98 F (36.7 C) (Oral)   Resp 18   Ht _0  (1.575 m)   Wt 83.9 kg   SpO2 99%   BMI 33.84 kg/m   Physical Exam Vitals and nursing note reviewed.  Constitutional:      Appearance: Normal appearance.  HENT:     Head: Normocephalic and atraumatic.     Nose: Nose normal.     Mouth/Throat:     Mouth: Mucous membranes are moist.  Eyes:     Extraocular Movements: Extraocular movements intact.     Conjunctiva/sclera: Conjunctivae normal.   Cardiovascular:     Rate and Rhythm: Normal rate.  Pulmonary:     Effort: Pulmonary effort is normal.     Breath sounds: Normal breath sounds.  Abdominal:     General: Abdomen is flat.     Palpations: Abdomen is soft.     Tenderness: There is abdominal tenderness (mild suprapubic).  Musculoskeletal:        General: No swelling. Normal range of motion.  Cervical back: Neck supple.  Skin:    General: Skin is warm and dry.  Neurological:     General: No focal deficit present.     Mental Status: She is alert.  Psychiatric:        Mood and Affect: Mood normal.      ED Results / Procedures / Treatments   Labs (all labs ordered are listed, but only abnormal results are displayed) Labs Reviewed  URINALYSIS, ROUTINE W REFLEX MICROSCOPIC    EKG None  Radiology No results found.  Procedures Procedures  Medications Ordered in the ED Medications - No data to display   MDM Rules/Calculators/A&P MDM Will check UA. Most recent cultures have been positive for E.coli. No signs of systemic infection.   ED Course  I have reviewed the triage vital signs and the nursing notes.  Pertinent labs & imaging results that were available during my care of the patient were reviewed by me and considered in my medical decision making (see chart for details).  Clinical Course as of 02/22/20 0933  Tue Feb 22, 2020  0917 UA positive for infection. Will send for culture as previous E. Coli cultures have been resistant or intermediate susceptibility to some cephalosporins.  [CS]    Clinical Course User Index [CS] Truddie Hidden, MD    Final Clinical Impression(s) / ED Diagnoses Final diagnoses:  Lower urinary tract infectious disease    Rx / DC Orders ED Discharge Orders    None       Truddie Hidden, MD 02/22/20 [phone]

## 2020-02-22 NOTE — ED Triage Notes (Signed)
Pt reports bilateral flank pain and pain in rectum and vagina x 2 weeks.  Reports urine is foul smelling and green in color.

## 2020-02-23 ENCOUNTER — Other Ambulatory Visit: Payer: Self-pay | Admitting: Family

## 2020-02-23 ENCOUNTER — Telehealth: Payer: Self-pay

## 2020-02-23 NOTE — Telephone Encounter (Signed)
Transition Care Management Unsuccessful Follow-up Telephone Call  Date of discharge and from where:  02/23/20 from [facility] ED  Attempts:  1st Attempt  Reason for unsuccessful TCM follow-up call:  Left voice message

## 2020-02-24 NOTE — Telephone Encounter (Signed)
Transition Care Management Unsuccessful Follow-up Telephone Call  Date of discharge and from where:  02/22/2020 from [facility] ED  Attempts:  2nd Attempt  Reason for unsuccessful TCM follow-up call:  Left voice message

## 2020-02-25 LAB — URINE CULTURE: Culture: >=100000 — AB

## 2020-02-25 NOTE — Telephone Encounter (Signed)
Transition Care Management Follow-up Telephone Call  Date of discharge and from where: 02/22/2020 from [facility]  How have you been since you were released from the hospital? Pt stated that she is doing well and has not concerns at this time.   Any questions or concerns? No  Items Reviewed:  Did the pt receive and understand the discharge instructions provided? Yes   Medications obtained and verified? Yes   Other? No   Any new allergies since your discharge? No   Dietary orders reviewed? NA  Do you have support at home? Yes   Functional Questionnaire: (I = Independent and D = Dependent) ADLs: I  Bathing/Dressing- I   Meal Prep- D  Eating- I  Maintaining continence- I  Transferring/Ambulation- D  Managing Meds- I   Follow up appointments reviewed:   Are transportation arrangements needed? No   If their condition worsens, is the pt aware to call PCP or go to the Emergency Dept.? Yes  Was the patient provided with contact information for the PCP's office or ED? Yes  Was to pt encouraged to call back with questions or concerns? Yes

## 2020-02-29 ENCOUNTER — Ambulatory Visit (INDEPENDENT_AMBULATORY_CARE_PROVIDER_SITE_OTHER): Payer: Medicare Other

## 2020-02-29 DIAGNOSIS — I5022 Chronic systolic (congestive) heart failure: Secondary | ICD-10-CM | POA: Diagnosis not present

## 2020-02-29 DIAGNOSIS — Z95810 Presence of automatic (implantable) cardiac defibrillator: Secondary | ICD-10-CM | POA: Diagnosis not present

## 2020-03-01 NOTE — Progress Notes (Signed)
EPIC Encounter for ICM Monitoring  Patient Name: Melissa Paul is a 75 y.o. female Date: 03/01/2020 Primary Care Physican: Sharion Balloon, FNP Primary Cardiologist:McDowell/McLean Electrophysiologist: Allred Bi-V Pacing:90% 01/26/2020 Weight:184lbs  AT/AF Burden:5.3%(taking Eliquis)    Spoke with patient and reports swelling of feet but has improved.  She increased fluid intake for past week due to recent ER visit for UTI.  She currently has a cold.      CorVueThoracic impedancesuggesting possible fluid accumulation starting 02/25/2020.  Prescribed:  Furosemide80 mgTake 1 tablet (80 mg total) by mouth daily as needed  Spirolactone 25 mg take 1 tablet daily  Eliquis 5 mg take 1 tablet twice a day  Labs: 01/31/2020 Creatinine 0.98, BUN 13, Potassium 4.3, Sodium 138, GFR 56-65 12/08/2019 Creatinine 1.15, BUN 14, Potassium 4.0, Sodium 135, GFR 46-53 08/24/2019 Creatinine 1.08, BUN 13, Potassium 3.6, Sodium 134, GFR 50-58 A complete set of results can be found in Results Review.  Recommendations:She takes extra Furosemide as needed for fluid symptoms and will take one if needed tomorrow.   Follow-up plan: ICM clinic phone appointment on2/17/2022 to recheck fluid levels. 91 day device clinic remote transmission3/28/2022.  EP/Cardiology Office Visits:4/18/2022with [place].   Copy of ICM check sent to Dr.Allred and Dr Domenic Polite.   3 month ICM trend: 02/29/2020.    1 Year ICM trend:       [name], RN 03/01/2020 12:38 PM

## 2020-03-02 ENCOUNTER — Other Ambulatory Visit: Payer: Self-pay | Admitting: Family

## 2020-03-02 DIAGNOSIS — G4700 Insomnia, unspecified: Secondary | ICD-10-CM

## 2020-03-02 DIAGNOSIS — F411 Generalized anxiety disorder: Secondary | ICD-10-CM

## 2020-03-07 IMAGING — DX DG CHEST 2V
2 series · 2 of 2 positions shown · non-contrast
Comparison: 02/27/2017

CLINICAL DATA: Chest and back pain. ICD discharged.

EXAM:
CHEST - 2 VIEW

[chest pa]
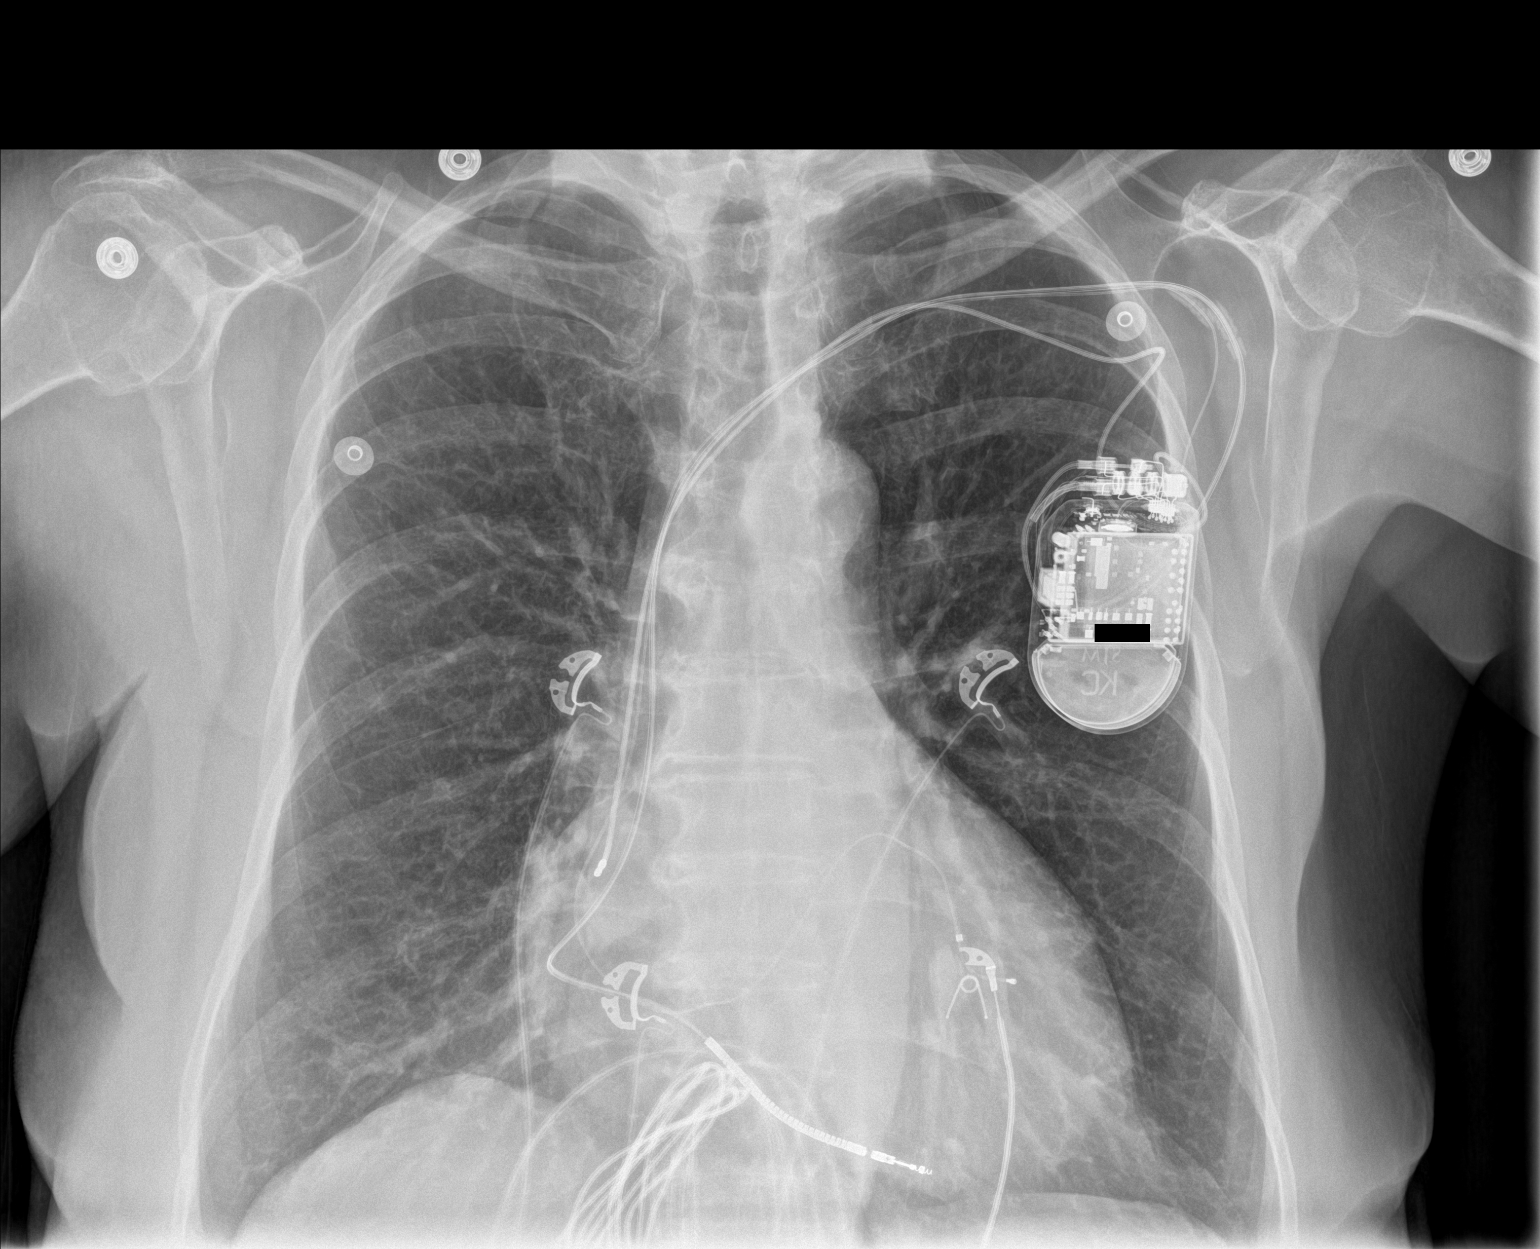

[chest lat]
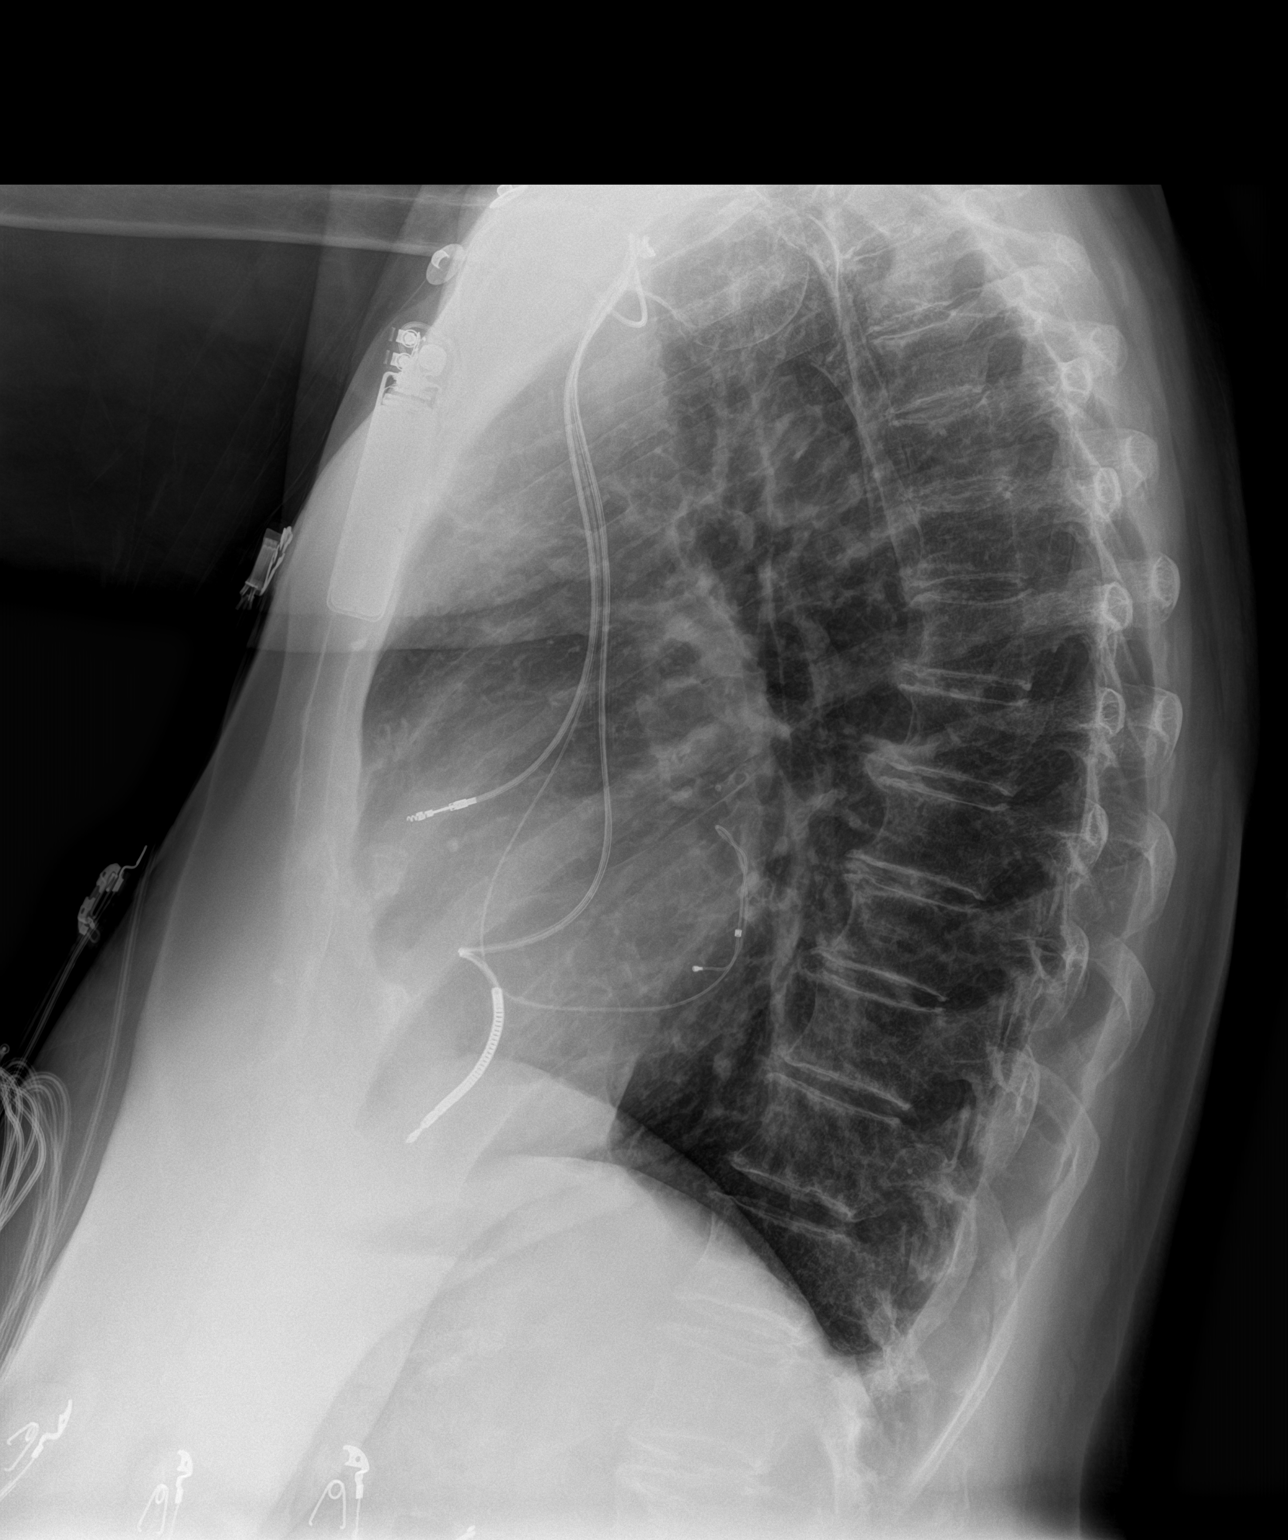

[2 of 2 positions shown; findings below may reference images not displayed]

FINDINGS: Borderline cardiomegaly. AICD in place. Pulmonary vascularity is
normal in the lungs are clear. No bone abnormality.
IMPRESSION: No acute disease. Borderline cardiomegaly.

## 2020-03-07 IMAGING — DX DG LUMBAR SPINE COMPLETE 4+V
5 series · 5 of 5 positions shown · non-contrast
Comparison: CT scan of the abdomen and pelvis dated 12/25/2018

CLINICAL DATA: Back pain and left-sided chest pain since a fall
yesterday due to syncope.

EXAM:
LUMBAR SPINE - COMPLETE 4+ VIEW

[l-spine ap]
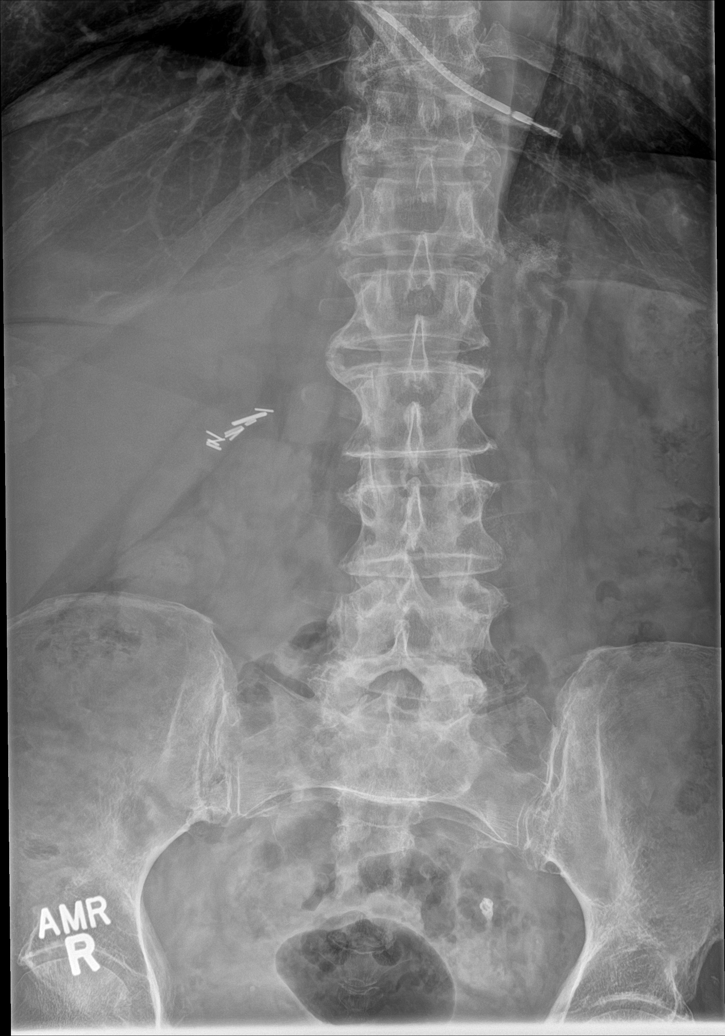

[l-spine obl (1 of 2)]
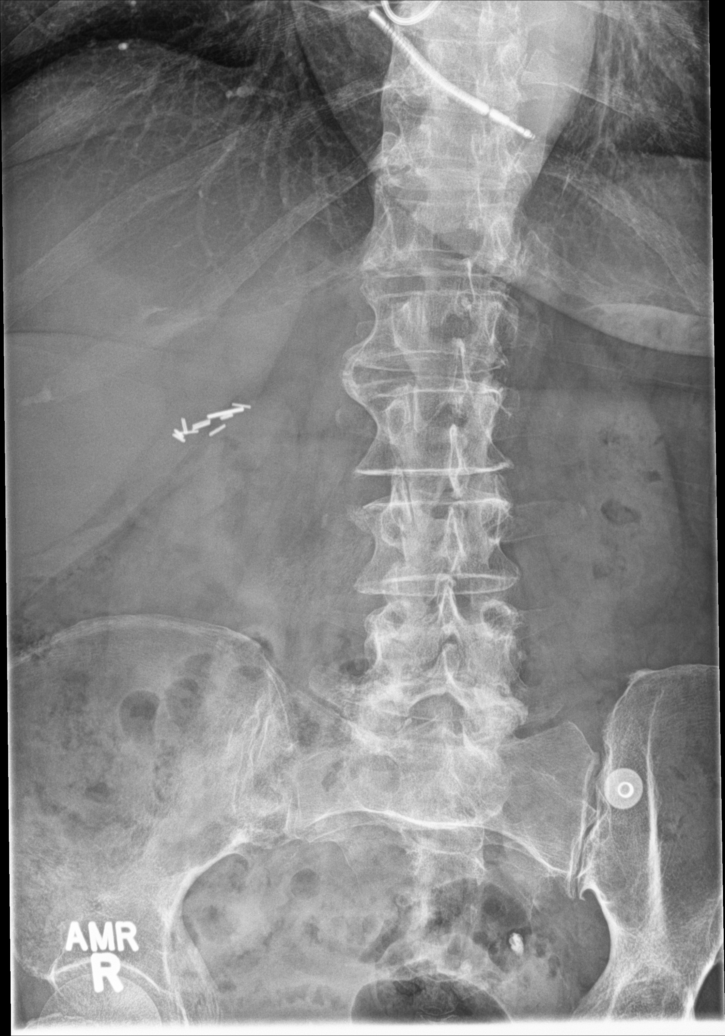

[l-spine obl (2 of 2)]
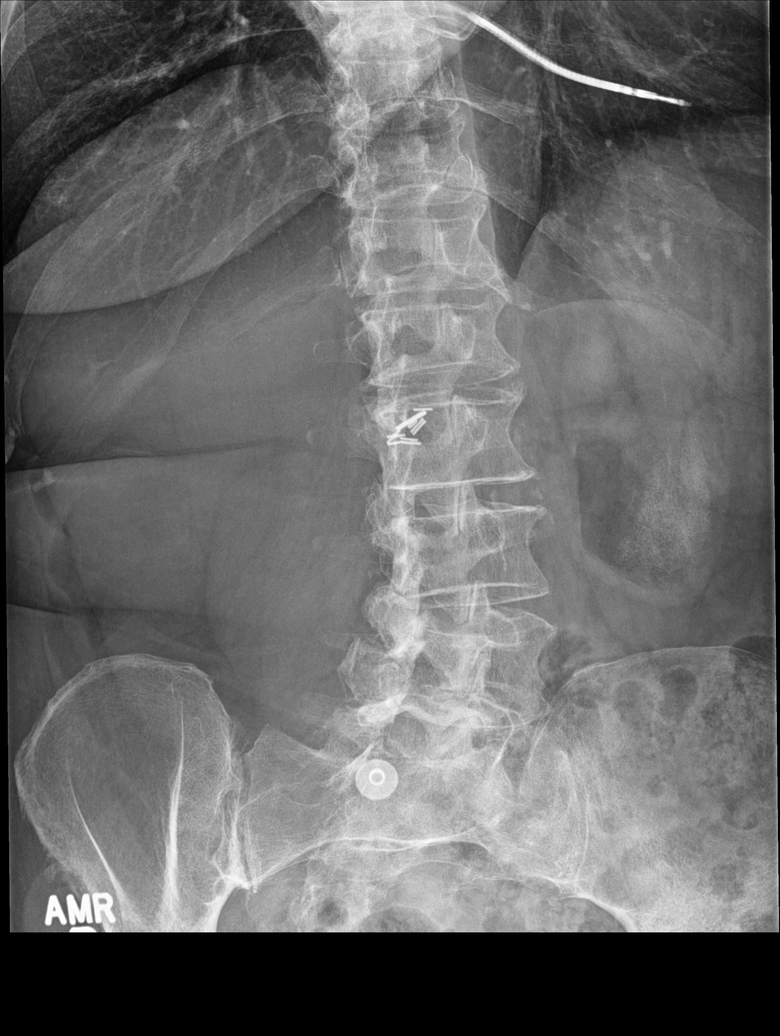

[l-spine lat]
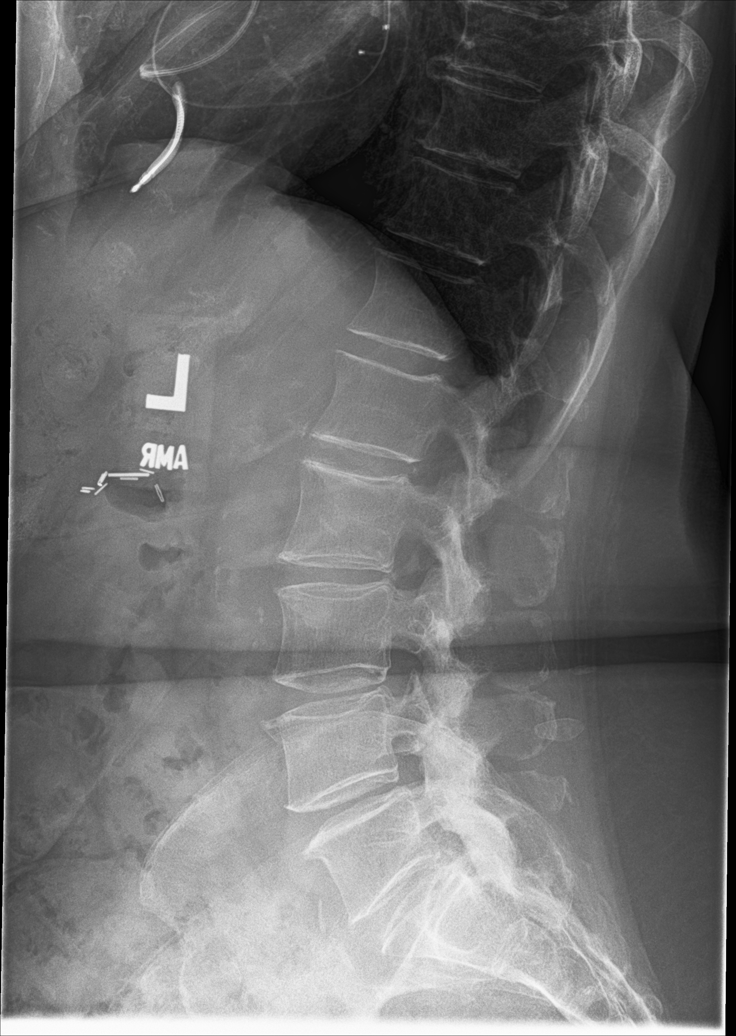

[l-spine spot]
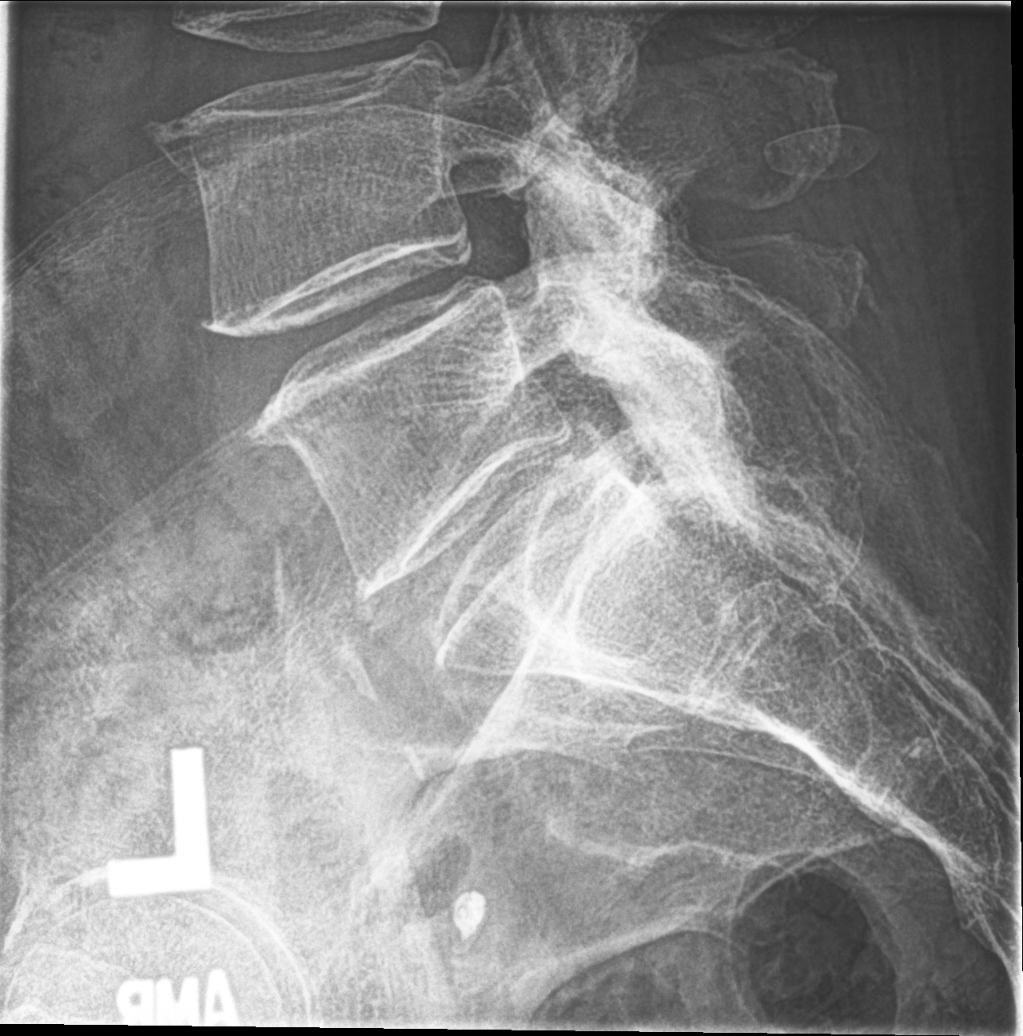

[5 of 5 positions shown; findings below may reference images not displayed]

FINDINGS: There is no fracture. Alignment is normal. Moderately severe
bilateral facet arthritis at L4-5 and L5-S1. Disc spaces are
preserved.
IMPRESSION: No acute abnormality of the lumbar spine. Degenerative facet
arthritis in the lower lumbar spine.

## 2020-03-09 ENCOUNTER — Ambulatory Visit (INDEPENDENT_AMBULATORY_CARE_PROVIDER_SITE_OTHER): Payer: Medicare Other

## 2020-03-09 DIAGNOSIS — Z95810 Presence of automatic (implantable) cardiac defibrillator: Secondary | ICD-10-CM

## 2020-03-09 DIAGNOSIS — I5022 Chronic systolic (congestive) heart failure: Secondary | ICD-10-CM

## 2020-03-15 NOTE — Progress Notes (Signed)
EPIC Encounter for ICM Monitoring  Patient Name: Melissa Paul is a 75 y.o. female Date: 03/15/2020 Primary Care Physican: Sharion Balloon, FNP Primary Cardiologist:[facility] Electrophysiologist: Allred Bi-V Pacing:90% 1/5/2022Weight:184lbs  AT/AF Burden:4.2%(taking Eliquis)    Transmission reviewed.  CorVueThoracic impedancesuggesting fluid levels returned to normal.  Prescribed:  Furosemide80 mgTake 1 tablet (80 mg total) by mouth daily as needed  Spirolactone 25 mg take 1 tablet daily  Eliquis 5 mg take 1 tablet twice a day  Labs: 01/31/2020 Creatinine 0.98, BUN 13, Potassium 4.3, Sodium 138, GFR 56-65 12/08/2019 Creatinine 1.15, BUN 14, Potassium 4.0, Sodium 135, GFR 46-53 08/24/2019 Creatinine 1.08, BUN 13, Potassium 3.6, Sodium 134, GFR 50-58 A complete set of results can be found in Results Review.  Recommendations: No changes.   Follow-up plan: ICM clinic phone appointment on3/29/2022. 91 day device clinic remote transmission3/28/2022.  EP/Cardiology Office Visits:4/18/2022with [place].   Copy of ICM check sent to Dr.Allred.   3 month ICM trend: 03/09/2020.    1 Year ICM trend:       [name], RN 03/15/2020 4:39 PM

## 2020-03-27 ENCOUNTER — Other Ambulatory Visit: Payer: Self-pay | Admitting: Family

## 2020-04-17 ENCOUNTER — Ambulatory Visit (INDEPENDENT_AMBULATORY_CARE_PROVIDER_SITE_OTHER): Payer: Medicare Other

## 2020-04-17 DIAGNOSIS — I5022 Chronic systolic (congestive) heart failure: Secondary | ICD-10-CM | POA: Diagnosis not present

## 2020-04-17 DIAGNOSIS — I428 Other cardiomyopathies: Secondary | ICD-10-CM

## 2020-04-18 ENCOUNTER — Ambulatory Visit (INDEPENDENT_AMBULATORY_CARE_PROVIDER_SITE_OTHER): Payer: Medicare Other

## 2020-04-18 DIAGNOSIS — Z95810 Presence of automatic (implantable) cardiac defibrillator: Secondary | ICD-10-CM

## 2020-04-18 DIAGNOSIS — I5022 Chronic systolic (congestive) heart failure: Secondary | ICD-10-CM

## 2020-04-18 LAB — CUP PACEART REMOTE DEVICE CHECK
Battery Remaining Longevity: 32 mo
Battery Remaining Percentage: 42 %
Battery Voltage: 2.92 V
Brady Statistic AP VP Percent: 78 %
Brady Statistic AP VS Percent: 2 %
Brady Statistic AS VP Percent: 11 %
Brady Statistic AS VS Percent: 1 %
Brady Statistic RA Percent Paced: 70 %
Date Time Interrogation Session: 20220329031937
HighPow Impedance: 70 Ohm
HighPow Impedance: 70 Ohm
Implantable Lead Implant Date: 20110912
Implantable Lead Implant Date: 20110912
Implantable Lead Implant Date: 20120710
Implantable Lead Location: 753858
Implantable Lead Location: 753859
Implantable Lead Location: 753860
Implantable Lead Model: 7122
Implantable Pulse Generator Implant Date: 20180621
Lead Channel Impedance Value: 330 Ohm
Lead Channel Impedance Value: 440 Ohm
Lead Channel Impedance Value: 530 Ohm
Lead Channel Pacing Threshold Amplitude: 0.5 V
Lead Channel Pacing Threshold Amplitude: 0.75 V
Lead Channel Pacing Threshold Amplitude: 0.75 V
Lead Channel Pacing Threshold Pulse Width: 0.5 ms
Lead Channel Pacing Threshold Pulse Width: 0.5 ms
Lead Channel Pacing Threshold Pulse Width: 0.5 ms
Lead Channel Sensing Intrinsic Amplitude: 11.8 mV
Lead Channel Sensing Intrinsic Amplitude: 2.4 mV
Lead Channel Setting Pacing Amplitude: 2 V
Lead Channel Setting Pacing Amplitude: 2 V
Lead Channel Setting Pacing Amplitude: 2 V
Lead Channel Setting Pacing Pulse Width: 0.5 ms
Lead Channel Setting Pacing Pulse Width: 0.5 ms
Lead Channel Setting Sensing Sensitivity: 0.5 mV
Pulse Gen Serial Number: 9761374

## 2020-04-19 NOTE — Progress Notes (Signed)
EPIC Encounter for ICM Monitoring  Patient Name: Melissa Paul is a 75 y.o. female Date: 04/19/2020 Primary Care Physican: Sharion Balloon, FNP Primary Cardiologist:[facility] Electrophysiologist: Allred Bi-V Pacing:89% 3/30/2022Weight:185lbs  AT/AF Burden:3.8%(taking Eliquis)    Spoke with patient and reports she has chronic shortness of breath and leg swelling.  She stated urine output has been decreased today and felt like she has some fluid.    CorVueThoracic impedancesuggesting possible fluid accumulation starting 04/14/2020.  Prescribed:  Furosemide80 mgTake 1 tablet (80 mg total) by mouth dailyas needed  Spirolactone 25 mg take 1 tablet daily  Eliquis 5 mg take 1 tablet twice a day  Labs: 01/31/2020 Creatinine 0.98, BUN 13, Potassium 4.3, Sodium 138, GFR 56-65 12/08/2019 Creatinine 1.15, BUN 14, Potassium 4.0, Sodium 135, GFR 46-53 08/24/2019 Creatinine 1.08, BUN 13, Potassium 3.6, Sodium 134, GFR 50-58 A complete set of results can be found in Results Review.  Recommendations: Pt reports she will take PRN Furosemide x 1-2 days.   Follow-up plan: ICM clinic phone appointment on4/07/2020 to recheck fluid levels. 91 day device clinic remote transmission3/28/2022.  EP/Cardiology Office Visits:4/18/2022with [place].   Copy of ICM check sent to Dr.Allred and Dr Domenic Polite.   3 month ICM trend: 04/18/2020.    1 Year ICM trend:       [name], RN 04/19/2020 3:29 PM

## 2020-04-22 ENCOUNTER — Other Ambulatory Visit: Payer: Self-pay | Admitting: Family

## 2020-04-22 ENCOUNTER — Other Ambulatory Visit (HOSPITAL_COMMUNITY): Payer: Self-pay | Admitting: Cardiology

## 2020-04-22 DIAGNOSIS — F411 Generalized anxiety disorder: Secondary | ICD-10-CM

## 2020-04-22 DIAGNOSIS — G4700 Insomnia, unspecified: Secondary | ICD-10-CM

## 2020-04-26 ENCOUNTER — Ambulatory Visit (INDEPENDENT_AMBULATORY_CARE_PROVIDER_SITE_OTHER): Payer: Medicare Other | Admitting: Family

## 2020-04-26 ENCOUNTER — Encounter: Payer: Self-pay | Admitting: Family

## 2020-04-26 ENCOUNTER — Other Ambulatory Visit: Payer: Self-pay

## 2020-04-26 VITALS — BP 94/62 | HR 70 | Temp 97.2°F | Ht 62.0 in | Wt 180.2 lb

## 2020-04-26 DIAGNOSIS — K219 Gastro-esophageal reflux disease without esophagitis: Secondary | ICD-10-CM | POA: Diagnosis not present

## 2020-04-26 DIAGNOSIS — I5023 Acute on chronic systolic (congestive) heart failure: Secondary | ICD-10-CM

## 2020-04-26 DIAGNOSIS — F411 Generalized anxiety disorder: Secondary | ICD-10-CM

## 2020-04-26 DIAGNOSIS — E039 Hypothyroidism, unspecified: Secondary | ICD-10-CM

## 2020-04-26 DIAGNOSIS — B370 Candidal stomatitis: Secondary | ICD-10-CM

## 2020-04-26 DIAGNOSIS — G4700 Insomnia, unspecified: Secondary | ICD-10-CM

## 2020-04-26 DIAGNOSIS — I480 Paroxysmal atrial fibrillation: Secondary | ICD-10-CM

## 2020-04-26 DIAGNOSIS — E1169 Type 2 diabetes mellitus with other specified complication: Secondary | ICD-10-CM

## 2020-04-26 DIAGNOSIS — I5022 Chronic systolic (congestive) heart failure: Secondary | ICD-10-CM

## 2020-04-26 DIAGNOSIS — K5900 Constipation, unspecified: Secondary | ICD-10-CM

## 2020-04-26 DIAGNOSIS — N390 Urinary tract infection, site not specified: Secondary | ICD-10-CM

## 2020-04-26 LAB — BAYER DCA HB A1C WAIVED: HB A1C (BAYER DCA - WAIVED): 8.1 % — ABNORMAL HIGH (ref <7.0)

## 2020-04-26 MED ORDER — LEVOTHYROXINE SODIUM 88 MCG PO TABS: ORAL_TABLET | ORAL | 4 refills | Status: DC

## 2020-04-26 MED ORDER — METFORMIN HCL ER 750 MG PO TB24: 1500.0000 mg | ORAL_TABLET | Freq: Every day | ORAL | 2 refills | Status: DC

## 2020-04-26 MED ORDER — PANTOPRAZOLE SODIUM 40 MG PO TBEC: 40.0000 mg | DELAYED_RELEASE_TABLET | Freq: Every day | ORAL | 2 refills | Status: DC

## 2020-04-26 MED ORDER — NYSTATIN 100000 UNIT/ML MT SUSP: 5.0000 mL | Freq: Four times a day (QID) | OROMUCOSAL | 1 refills | Status: DC

## 2020-04-26 MED ORDER — FUROSEMIDE 80 MG PO TABS: ORAL_TABLET | ORAL | 1 refills | Status: DC

## 2020-04-26 MED ORDER — SPIRONOLACTONE 25 MG PO TABS: 25.0000 mg | ORAL_TABLET | Freq: Every day | ORAL | 0 refills | Status: DC

## 2020-04-26 MED ORDER — HYDROXYZINE PAMOATE 25 MG PO CAPS: 25.0000 mg | ORAL_CAPSULE | Freq: Three times a day (TID) | ORAL | 0 refills | Status: DC | PRN

## 2020-04-26 NOTE — Patient Instructions (Signed)
Oral Thrush, Adult Oral thrush, also called oral candidiasis, is a fungal infection that develops in the mouth and throat and on the tongue. It causes white patches to form in the mouth and on the tongue. Many cases of thrush are mild, but this infection can also be serious. Thrush can be a repeated (recurrent) problem for certain people who have a weak body defense system (immune system). The weakness can be caused by chronic illnesses, or by taking medicines that limit the body's ability to fight infection. If a person has difficulty fighting infection, the fungus that causes thrush can spread through the body. This can cause life-threatening blood or organ infections. What are the causes? This condition is caused by a fungus (yeast) called Candida albicans.  This fungus is normally present in small amounts in the mouth and on other mucous membranes. It usually causes no harm.  If conditions are present that allow the fungus to grow without control, it invades surrounding tissues and becomes an infection.  Other Candida species can also lead to thrush, though this is rare. What increases the risk? The following factors may make you more likely to develop this condition:  Having a weakened immune system.  Being an older adult.  Having diabetes, cancer, or HIV (human immunodeficiency virus).  Having dry mouth (xerostomia).  Being pregnant or breastfeeding.  Having poor dental care, especially in those who have dentures.  Using antibiotic or steroid medicines. What are the signs or symptoms? Symptoms of this condition can vary from mild and moderate to severe and persistent. Symptoms may include:  A burning feeling in the mouth and throat. This can occur at the start of a thrush infection.  White patches that stick to the mouth and tongue. The tissue around the patches may be red, raw, and painful. If rubbed (during tooth brushing, for example), the patches and the tissue of the mouth  may bleed easily.  A bad taste in the mouth or difficulty tasting foods.  A cottony feeling in the mouth.  Pain during eating and swallowing.  Poor appetite.  Cracking at the corners of the mouth.   How is this diagnosed? This condition is diagnosed based on:  A physical exam.  Your medical history. How is this treated? This condition is treated with medicines called antifungals, which prevent the growth of fungi. These medicines are either applied directly to the affected area (topical) or swallowed (oral). The treatment will depend on the severity of the condition.  Mild cases of thrush may be treated with an antifungal mouth rinse or lozenges. Treatment usually lasts about 14 days.  Moderate to severe cases of thrush can be treated with oral antifungal medicine, if they have spread to the esophagus. A topical antifungal medicine may also be used. For some severe infections, treatment may need to continue for more than 14 days. ? Oral antifungal medicines are rarely used during pregnancy because they may be harmful to the unborn child. If you are pregnant, talk with your health care provider about options for treatment.  Persistent or recurrent thrush. For cases of thrush that do not go away or keep coming back: ? Treatment may be needed twice as long as the symptoms last. ? Treatment will include both oral and topical antifungal medicines. ? People with a weakened immune system can take an antifungal medicine on a continuous basis to prevent thrush infections. It is important to treat conditions that make a person more likely to get thrush, such as   diabetes or HIV. Follow these instructions at home: Medicines  Take or use over-the-counter and prescription medicines only as told by your health care provider.  Talk with your health care provider about an over-the-counter medicine called gentian violet, which kills bacteria and fungi. Relieving soreness and discomfort To help  reduce the discomfort of thrush:  Drink cold liquids such as water or iced tea.  Try flavored ice treats or frozen juices.  Eat foods that are easy to swallow, such as gelatin, ice cream, or custard.  Try drinking from a straw if the patches in your mouth are painful.   General instructions  Eat plain, unflavored yogurt as directed by your health care provider. Check the label to make sure the yogurt contains live cultures. This yogurt can help healthy bacteria grow in the mouth and can stop the growth of the fungus that causes thrush.  If you wear dentures, remove the dentures before going to bed, brush them vigorously, and soak them in a cleaning solution as directed by your health care provider.  Rinse your mouth with a warm salt-water mixture several times a day. To make a salt-water mixture, dissolve -1 tsp (3-6 g) of salt in 1 cup (237 mL) of warm water. Contact a health care provider if:  Your symptoms are getting worse or are not improving within 7 days of starting treatment.  You have symptoms of a spreading infection, such as white patches on the skin outside of the mouth.  You are breastfeeding your baby and you have redness and pain in the nipples. Summary  Oral thrush, also called oral candidiasis, is a fungal infection that develops in the mouth and throat and on the tongue. It causes white patches to form in the mouth and on the tongue.  You are more likely to get this condition if you have a weakened immune system or an underlying condition, such as HIV, cancer, or diabetes.  This condition is treated with medicines called antifungals, which prevent the growth of fungi.  Contact a health care provider if your symptoms do not improve, or get worse, within 7 days of starting treatment. This information is not intended to replace advice given to you by your health care provider. Make sure you discuss any questions you have with your health care provider. Document  Revised: 11/13/2018 Document Reviewed: 11/13/2018 Elsevier Patient Education  2021 [employer]  

## 2020-04-26 NOTE — Progress Notes (Signed)
Subjective:    Patient ID: Melissa Paul, female    DOB: 1945-03-15, 75 y.o.   MRN: [id number]  Chief Complaint  Patient presents with  . Diabetes     36mh   . tongue pain    Been going on x 1 mth and having sweet taste in mouth   Pt presents to the office today for chronic follow up. She is followed by Cardiologists 3 months for CHF, Cardiomyopathy, A Fib, and ventricular tachyarrhythmia. She has a pacemaker. She takes Eliquis BID.   She has had recurrent UTI's and has seen Urologists.  She is complaining of oral thrush on her tongue over the last few weeks that is unchanged.  Diabetes She presents for her follow-up diabetic visit. She has type 2 diabetes mellitus. Her disease course has been stable. Hypoglycemia symptoms include dizziness. Pertinent negatives for hypoglycemia include no nervousness/anxiousness. Pertinent negatives for diabetes include no blurred vision and no foot paresthesias. Symptoms are stable. Diabetic complications include heart disease and nephropathy. Risk factors for coronary artery disease include dyslipidemia, diabetes mellitus, hypertension, sedentary lifestyle and post-menopausal. She is following a generally unhealthy diet. Her overall blood glucose range is 140-180 mg/dl. An ACE inhibitor/angiotensin II receptor blocker is being taken. Eye exam is current.  Thyroid Problem Presents for follow-up visit. Symptoms include constipation, dry skin and palpitations. Patient reports no anxiety or diaphoresis. The symptoms have been stable.  Anxiety Presents for follow-up visit. Symptoms include dizziness, excessive worry, irritability and palpitations. Patient reports no nervous/anxious behavior. Symptoms occur occasionally. The severity of symptoms is moderate.    Constipation This is a chronic problem. The current episode started more than 1 year ago. The problem has been waxing and waning since onset. Her stool frequency is 2 to 3 times per week. She has  tried laxatives for the symptoms. The treatment provided moderate relief.      Review of Systems  Constitutional: Positive for irritability. Negative for diaphoresis.  Eyes: Negative for blurred vision.  Cardiovascular: Positive for palpitations.  Gastrointestinal: Positive for constipation.  Neurological: Positive for dizziness.  Psychiatric/Behavioral: The patient is not nervous/anxious.   All other systems reviewed and are negative.      Objective:   Physical Exam Vitals reviewed.  Constitutional:      General: She is not in acute distress.    Appearance: She is well-developed. She is obese.  HENT:     Head: Normocephalic and atraumatic.     Right Ear: Tympanic membrane normal.     Left Ear: Tympanic membrane normal.     Mouth/Throat:     Comments: White coating on tongue Eyes:     Pupils: Pupils are equal, round, and reactive to light.  Neck:     Thyroid: No thyromegaly.  Cardiovascular:     Rate and Rhythm: Normal rate and regular rhythm.     Heart sounds: Normal heart sounds. No murmur heard.   Pulmonary:     Effort: Pulmonary effort is normal. No respiratory distress.     Breath sounds: Normal breath sounds. No wheezing.  Abdominal:     General: Bowel sounds are normal. There is no distension.     Palpations: Abdomen is soft.     Tenderness: There is no abdominal tenderness.  Musculoskeletal:        General: No tenderness. Normal range of motion.     Cervical back: Normal range of motion and neck supple.  Skin:    General: Skin is warm and dry.  Neurological:     Mental Status: She is alert and oriented to person, place, and time.     Cranial Nerves: No cranial nerve deficit.     Deep Tendon Reflexes: Reflexes are normal and symmetric.  Psychiatric:        Behavior: Behavior normal.        Thought Content: Thought content normal.        Judgment: Judgment normal.     Diabetic Foot Exam - Simple   Simple Foot Form Diabetic Foot exam was performed  with the following findings: Yes 04/26/2020  4:11 PM  Visual Inspection No deformities, no ulcerations, no other skin breakdown bilaterally: Yes Sensation Testing Intact to touch and monofilament testing bilaterally: Yes Pulse Check Comments         BP 94/62   Pulse 70   Temp (!) 97.2 F (36.2 C) (Temporal)   Ht '5\' 2"'  (1.575 m)   Wt 180 lb 3.2 oz (81.7 kg)   BMI 32.96 kg/m   Assessment & Plan:  Melissa Paul comes in today with chief complaint of Diabetes ( 59mh ) and tongue pain (Been going on x 1 mth and having sweet taste in mouth)   Diagnosis and orders addressed:  1. GAD (generalized anxiety disorder) - hydrOXYzine (VISTARIL) 25 MG capsule; Take 1 capsule (25 mg total) by mouth 3 (three) times daily as needed. (Needs to be seen before next refill)  Dispense: 90 capsule; Refill: 0 - CMP14+EGFR - CBC with Differential/Platelet  2. Insomnia, unspecified type - hydrOXYzine (VISTARIL) 25 MG capsule; Take 1 capsule (25 mg total) by mouth 3 (three) times daily as needed. (Needs to be seen before next refill)  Dispense: 90 capsule; Refill: 0 - CMP14+EGFR - CBC with Differential/Platelet  3. Gastroesophageal reflux disease, unspecified whether esophagitis present - pantoprazole (PROTONIX) 40 MG tablet; Take 1 tablet (40 mg total) by mouth daily before breakfast.  Dispense: 90 tablet; Refill: 2 - CMP14+EGFR - CBC with Differential/Platelet  4. Type 2 diabetes mellitus with other specified complication, without long-term current use of insulin (HCC) - Bayer DCA Hb A1c Waived - metFORMIN (GLUCOPHAGE XR) 750 MG 24 hr tablet; Take 2 tablets (1,500 mg total) by mouth daily with breakfast.  Dispense: 180 tablet; Refill: 2 - CMP14+EGFR - CBC with Differential/Platelet  5. Oral thrush - nystatin (MYCOSTATIN) 100000 UNIT/ML suspension; Take 5 mLs (500,000 Units total) by mouth 4 (four) times daily.  Dispense: 473 mL; Refill: 1 - CMP14+EGFR - CBC with Differential/Platelet  6.  Chronic systolic heart failure (HCC) - CMP14+EGFR - CBC with Differential/Platelet  7. Paroxysmal atrial fibrillation (HCC) - CMP14+EGFR - CBC with Differential/Platelet  8. Acute on chronic systolic ACC/AHA stage C congestive heart failure (HCC) - CMP14+EGFR - CBC with Differential/Platelet  9. Hypothyroidism, unspecified type  - CMP14+EGFR - CBC with Differential/Platelet - TSH  10. Constipation, unspecified constipation type - CMP14+EGFR - CBC with Differential/Platelet  11. Recurrent UTI   Labs pending Health Maintenance reviewed Diet and exercise encouraged  Follow up plan:  4 months    CEvelina Dun FNP

## 2020-04-27 ENCOUNTER — Other Ambulatory Visit: Payer: Self-pay | Admitting: Family Medicine

## 2020-04-27 ENCOUNTER — Other Ambulatory Visit: Payer: Self-pay | Admitting: Family

## 2020-04-27 ENCOUNTER — Ambulatory Visit (INDEPENDENT_AMBULATORY_CARE_PROVIDER_SITE_OTHER): Payer: Medicare Other

## 2020-04-27 DIAGNOSIS — I5022 Chronic systolic (congestive) heart failure: Secondary | ICD-10-CM

## 2020-04-27 DIAGNOSIS — Z95810 Presence of automatic (implantable) cardiac defibrillator: Secondary | ICD-10-CM

## 2020-04-27 DIAGNOSIS — R7989 Other specified abnormal findings of blood chemistry: Secondary | ICD-10-CM

## 2020-04-27 LAB — TSH: TSH: 2.96 u[IU]/mL (ref 0.450–4.500)

## 2020-04-27 LAB — CMP14+EGFR
ALT: 12 IU/L (ref 0–32)
AST: 10 IU/L (ref 0–40)
Albumin/Globulin Ratio: 1.9 (ref 1.2–2.2)
Albumin: 4.5 g/dL (ref 3.7–4.7)
Alkaline Phosphatase: 69 IU/L (ref 44–121)
BUN/Creatinine Ratio: 12 (ref 12–28)
BUN: 16 mg/dL (ref 8–27)
Bilirubin Total: 0.4 mg/dL (ref 0.0–1.2)
CO2: 24 mmol/L (ref 20–29)
Calcium: 9.4 mg/dL (ref 8.7–10.3)
Chloride: 95 mmol/L — ABNORMAL LOW (ref 96–106)
Creatinine, Ser: 1.36 mg/dL — ABNORMAL HIGH (ref 0.57–1.00)
Globulin, Total: 2.4 g/dL (ref 1.5–4.5)
Glucose: 133 mg/dL — ABNORMAL HIGH (ref 65–99)
Potassium: 4.6 mmol/L (ref 3.5–5.2)
Sodium: 136 mmol/L (ref 134–144)
Total Protein: 6.9 g/dL (ref 6.0–8.5)
eGFR: 40 mL/min/{1.73_m2} — ABNORMAL LOW (ref >59)

## 2020-04-27 LAB — CBC WITH DIFFERENTIAL/PLATELET
Basophils Absolute: 0.1 10*3/uL (ref 0.0–0.2)
Basos: 1 %
EOS (ABSOLUTE): 0.1 10*3/uL (ref 0.0–0.4)
Eos: 1 %
Hematocrit: 35.7 % (ref 34.0–46.6)
Hemoglobin: 11.9 g/dL (ref 11.1–15.9)
Immature Grans (Abs): 0.1 10*3/uL (ref 0.0–0.1)
Immature Granulocytes: 1 %
Lymphocytes Absolute: 3.2 10*3/uL — ABNORMAL HIGH (ref 0.7–3.1)
Lymphs: 30 %
MCH: 28.5 pg (ref 26.6–33.0)
MCHC: 33.3 g/dL (ref 31.5–35.7)
MCV: 86 fL (ref 79–97)
Monocytes Absolute: 0.8 10*3/uL (ref 0.1–0.9)
Monocytes: 7 %
Neutrophils Absolute: 6.4 10*3/uL (ref 1.4–7.0)
Neutrophils: 60 %
Platelets: 271 10*3/uL (ref 150–450)
RBC: 4.17 x10E6/uL (ref 3.77–5.28)
RDW: 13.6 % (ref 11.7–15.4)
WBC: 10.7 10*3/uL (ref 3.4–10.8)

## 2020-04-27 MED ORDER — EMPAGLIFLOZIN 10 MG PO TABS: 10.0000 mg | ORAL_TABLET | Freq: Every day | ORAL | 1 refills | Status: DC

## 2020-04-27 NOTE — Progress Notes (Signed)
Remote ICD transmission.   

## 2020-04-28 ENCOUNTER — Telehealth: Payer: Self-pay

## 2020-04-28 NOTE — Progress Notes (Signed)
EPIC Encounter for ICM Monitoring  Patient Name: Melissa Paul is a 75 y.o. female Date: 04/28/2020 Primary Care Physican: Sharion Balloon, FNP Primary Cardiologist:[facility] Electrophysiologist: Allred Bi-V Pacing:89% 3/30/2022Weight:185lbs 04/28/2020 Weight:   180 lbs  AT/AF Burden:3.8%(taking Eliquis)    Spoke with patient and reports decrease in leg swelling and 5lb weight loss.    CorVueThoracic impedancesuggestingfluid levels returned to normal after taking extra Furosemide.  Prescribed:  Furosemide80 mgTake 1 tablet (80 mg total) by mouth dailyas needed  Spirolactone 25 mg take 1 tablet daily  Eliquis 5 mg take 1 tablet twice a day  Labs: 04/26/2020 Creatinine 1.36, BUN 16, Potassium 4.6, Sodium 136, GFR 40 01/31/2020 Creatinine 0.98, BUN 13, Potassium 4.3, Sodium 138, GFR 56-65 A complete set of results can be found in Results Review.  Recommendations:No changes and encouraged to call if experiencing any fluid symptoms.  Follow-up plan: ICM clinic phone appointment on5/02/2020. 91 day device clinic remote transmission6/27/2022.  EP/Cardiology Office Visits:4/18/2022with [place].   Copy of ICM check sent to Dr.Allred.l.   3 month ICM trend: 04/27/2020.    1 Year ICM trend:       [name], RN 04/28/2020 8:54 AM

## 2020-05-01 NOTE — Telephone Encounter (Signed)
Please schedule an appointment with Melissa Paul for a diabetic education and medication assistance.

## 2020-05-01 NOTE — Telephone Encounter (Signed)
Appt made sample up front

## 2020-05-02 ENCOUNTER — Other Ambulatory Visit: Payer: Medicare Other

## 2020-05-02 ENCOUNTER — Other Ambulatory Visit: Payer: Self-pay

## 2020-05-03 LAB — CMP14+EGFR
ALT: 10 IU/L (ref 0–32)
AST: 11 IU/L (ref 0–40)
Albumin/Globulin Ratio: 1.8 (ref 1.2–2.2)
Albumin: 4.2 g/dL (ref 3.7–4.7)
Alkaline Phosphatase: 80 IU/L (ref 44–121)
BUN/Creatinine Ratio: 13 (ref 12–28)
BUN: 15 mg/dL (ref 8–27)
Bilirubin Total: 0.3 mg/dL (ref 0.0–1.2)
CO2: 24 mmol/L (ref 20–29)
Calcium: 8.7 mg/dL (ref 8.7–10.3)
Chloride: 95 mmol/L — ABNORMAL LOW (ref 96–106)
Creatinine, Ser: 1.15 mg/dL — ABNORMAL HIGH (ref 0.57–1.00)
Globulin, Total: 2.3 g/dL (ref 1.5–4.5)
Glucose: 272 mg/dL — ABNORMAL HIGH (ref 65–99)
Potassium: 4.1 mmol/L (ref 3.5–5.2)
Sodium: 138 mmol/L (ref 134–144)
Total Protein: 6.5 g/dL (ref 6.0–8.5)
eGFR: 49 mL/min/{1.73_m2} — ABNORMAL LOW (ref >59)

## 2020-05-08 ENCOUNTER — Encounter: Payer: Self-pay | Admitting: Cardiology

## 2020-05-08 ENCOUNTER — Ambulatory Visit (INDEPENDENT_AMBULATORY_CARE_PROVIDER_SITE_OTHER): Payer: Medicare Other | Admitting: Cardiology

## 2020-05-08 ENCOUNTER — Other Ambulatory Visit: Payer: Self-pay

## 2020-05-08 VITALS — BP 134/76 | HR 78 | Ht 62.5 in | Wt 181.8 lb

## 2020-05-08 DIAGNOSIS — I428 Other cardiomyopathies: Secondary | ICD-10-CM | POA: Diagnosis not present

## 2020-05-08 DIAGNOSIS — I480 Paroxysmal atrial fibrillation: Secondary | ICD-10-CM

## 2020-05-08 DIAGNOSIS — I5022 Chronic systolic (congestive) heart failure: Secondary | ICD-10-CM | POA: Diagnosis not present

## 2020-05-08 NOTE — Patient Instructions (Signed)
Medication Instructions:  Continue all current medications.  Labwork: none  Testing/Procedures: none  Follow-Up: 3 months   Any Other Special Instructions Will Be Listed Below (If Applicable).  If you need a refill on your cardiac medications before your next appointment, please call your pharmacy.  

## 2020-05-08 NOTE — Progress Notes (Signed)
Cardiology Office Note  Date: 05/08/2020   ID: Melissa Paul, DOB January 10, 1946, MRN [id number]  PCP:  Sharion Balloon, FNP  Cardiologist:  Rozann Lesches, MD Electrophysiologist:  Thompson Grayer, MD   Chief Complaint  Patient presents with  . Cardiac follow-up    History of Present Illness: Melissa Paul is a 75 y.o. female last seen in January.  She is here for a routine visit.  Reports no recent change in symptoms.  Enjoyed spending time with her family yesterday on Easter.  They had a cookout.  She sees Dr. [name], St. Jude biventricular ICD in place.  Recent thoracic impedance showed improvement in fluid status following extra dosing of diuretics.  She has had no device shocks or syncope.  I reviewed her recent lab work as outlined below.  We also went over her medications, she reports compliance with regimen outlined below.  No spontaneous bleeding problems on Eliquis.  Past Medical History:  Diagnosis Date  . Anxiety   . Chronic systolic heart failure ([place])   . Constipation   . Cyst of right kidney 11/12/2017  . Glucose intolerance (pre-diabetes)   . Hyperlipemia   . Hypothyroidism   . LBBB (left bundle branch block)   . Mitral regurgitation    Mild to Moderate  . Nonischemic cardiomyopathy ([name])    LVEF 35-40% by echo 8/13  . Paroxysmal atrial fibrillation (H[facility])    Brief episodes by device interrogation  . UTI (urinary tract infection)    Recurrent  . Ventricular tachycardia ([place])    ICD therapy for VT    Past Surgical History:  Procedure Laterality Date  . APPENDECTOMY    . BIV ICD GENERATOR CHANGEOUT N/A 07/11/2016   Procedure: BiV ICD Generator Changeout;  Surgeon: Thompson Grayer, MD;  Location: [facility];  Service: Cardiovascular;  Laterality: N/A;  . CARDIAC DEFIBRILLATOR PLACEMENT  09/2009   St. Jude BiV ICD by Dr. Sandria Manly class III  . CHOLECYSTECTOMY    . has one ovary left    . KNEE ARTHROSCOPY     Left  . LEFT AND RIGHT HEART  CATHETERIZATION WITH CORONARY ANGIOGRAM N/A 04/12/2014   Procedure: LEFT AND RIGHT HEART CATHETERIZATION WITH CORONARY ANGIOGRAM;  Surgeon: Leonie Man, MD;  Location: [facility] C[facility] LAB;  Service: Cardiovascular;  Laterality: N/A;  . Left breast biopsy x 2     no maliganancy  . PACEMAKER IMPLANT    . RIGHT/LEFT HEART CATH AND CORONARY ANGIOGRAPHY N/A 03/26/2019   Procedure: RIGHT/LEFT HEART CATH AND CORONARY ANGIOGRAPHY;  Surgeon: [name], MD;  Location: [facility];  Service: Cardiovascular;  Laterality: N/A;  . TOTAL ABDOMINAL HYSTERECTOMY     (R) ovary removed    Current Outpatient Medications  Medication Sig Dispense Refill  . apixaban (ELIQUIS) 5 MG TABS tablet Take 1 tablet (5 mg total) by mouth 2 (two) times daily. 60 tablet 6  . blood glucose meter kit and supplies Dispense based on patient and insurance preference. Use up to four times daily as directed. (FOR ICD-10 E10.9, E11.9). 1 each 0  . carvedilol (COREG) 6.25 MG tablet TAKE 3 TABLETS BY MOUTH TWICE DAILY 180 tablet 5  . digoxin (LANOXIN) 0.125 MG tablet Take 0.0625 mg by mouth daily.    . empagliflozin (JARDIANCE) 10 MG TABS tablet Take 1 tablet (10 mg total) by mouth daily before breakfast. 90 tablet 1  . furosemide (LASIX) 80 MG tablet TAKE 1 TABLET BY MOUTH ONCE  DAILY AS NEEDED FOR  EXTRA  FLUID 90 tablet 1  . hydrOXYzine (VISTARIL) 25 MG capsule Take 1 capsule (25 mg total) by mouth 3 (three) times daily as needed. (Needs to be seen before next refill) 90 capsule 0  . levothyroxine (EUTHYROX) 88 MCG tablet TAKE 1 TABLET BY MOUTH ONCE DAILY BEFORE BREAKFAST *NEED LABWORK* 90 tablet 4  . metFORMIN (GLUCOPHAGE XR) 750 MG 24 hr tablet Take 2 tablets (1,500 mg total) by mouth daily with breakfast. 180 tablet 2  . nystatin (MYCOSTATIN) 100000 UNIT/ML suspension Take 5 mLs (500,000 Units total) by mouth 4 (four) times daily. 473 mL 1  . pantoprazole (PROTONIX) 40 MG tablet Take 1 tablet (40 mg total) by mouth daily  before breakfast. 90 tablet 2  . spironolactone (ALDACTONE) 25 MG tablet Take 1 tablet (25 mg total) by mouth at bedtime. Needs appt for refills 90 tablet 0   No current facility-administered medications for this visit.   Allergies:  Buspar [buspirone], Lexapro [escitalopram oxalate], Bactrim, Cephalexin, Chlorhexidine gluconate, Clindamycin, Contrast media [iodinated diagnostic agents], Doxycycline, Latex, Levofloxacin, Nitrofurantoin monohyd macro, and Penicillins   ROS: No syncope.  Physical Exam: VS:  BP 134/76   Pulse 78   Ht 5' 2.5" (1.588 m)   Wt 181 lb 12.8 oz (82.5 kg)   SpO2 98%   BMI 32.72 kg/m , BMI Body mass index is 32.72 kg/m.  Wt Readings from Last 3 Encounters:  05/08/20 181 lb 12.8 oz (82.5 kg)  04/26/20 180 lb 3.2 oz (81.7 kg)  02/22/20 185 lb (83.9 kg)    General: Patient appears comfortable at rest. HEENT: Conjunctiva and lids normal, wearing a mask. Neck: Supple, no elevated JVP or carotid bruits, no thyromegaly. Lungs: Clear to auscultation, nonlabored breathing at rest. Cardiac: Regular rate and rhythm, no S3 or significant systolic murmur. Extremities: Mild ankle edema.  ECG:  An ECG dated 12/08/2019 was personally reviewed today and demonstrated:  Dual-chamber paced rhythm.  Recent Labwork: 04/26/2020: Hemoglobin 11.9; Platelets 271; TSH 2.960 05/02/2020: ALT 10; AST 11; BUN 15; Creatinine, Ser 1.15; Potassium 4.1; Sodium 138     Component Value Date/Time   CHOL 156 10/19/2018 1133   TRIG 89 10/19/2018 1133   HDL 45 10/19/2018 1133   CHOLHDL 3.5 10/19/2018 1133   CHOLHDL 3.3 04/11/2007 0600   VLDL 10 04/11/2007 0600   LDLCALC 94 10/19/2018 1133    Other Studies Reviewed Today:  Echocardiogram 08/11/2019: 1. Left ventricular ejection fraction, by estimation, is approximately  35%. The left ventricle demonstrates regional wall motion abnormalities  (see scoring diagram/findings for description). The left ventricular  internal cavity size was  moderately  dilated. Left ventricular diastolic parameters are consistent with Grade I  diastolic dysfunction (impaired relaxation).  2. Right ventricular systolic function is normal. The right ventricular  size is normal. A device wire is visualized. There is normal pulmonary  artery systolic pressure. The estimated right ventricular systolic  pressure is 54.6 mmHg.  3. Left atrial size was mildly dilated.  4. The mitral valve is abnormal, mildly thickened and calcified. Mild to  moderate mitral valve regurgitation.  5. The aortic valve is tricuspid. Aortic valve regurgitation is trivial.  6. The inferior vena cava is normal in size with greater than 50%  respiratory variability, suggesting right atrial pressure of 3 mmHg.   Assessment and Plan:  1.  Nonischemic cardiomyopathy with stable chronic systolic heart failure.  No changes to current diuretic regimen, she is tolerating this well and adjusts  based on weight change and thoracic impedance.  LVEF approximately 35% by last evaluation, we will consider a follow-up echocardiogram around the time of her next visit.  Continue Coreg, Lanoxin, Jardiance, and Aldactone.  She has not been on ARB/ANRI given generally low blood pressures although today's is higher, follow trend.  2.  Paroxysmal atrial fibrillation with relatively low rhythm burden and no sense of palpitations.  She continues on Eliquis for stroke prophylaxis with CHA2DS2-VASc score of 5.  Medication Adjustments/Labs and Tests Ordered: Current medicines are reviewed at length with the patient today.  Concerns regarding medicines are outlined above.   Tests Ordered: No orders of the defined types were placed in this encounter.   Medication Changes: No orders of the defined types were placed in this encounter.   Disposition:  Follow up 3 months.  Signed, Satira Sark, MD, [facility] 05/08/2020 10:37 AM    [facility] [address], [place], [place] [address] Phone: [phone]; Fax: [phone]

## 2020-05-16 ENCOUNTER — Telehealth: Payer: Self-pay | Admitting: Family

## 2020-05-16 NOTE — Telephone Encounter (Signed)
Patient aware.

## 2020-05-16 NOTE — Telephone Encounter (Signed)
She needs to continue with taking both of these.

## 2020-05-16 NOTE — Telephone Encounter (Signed)
Pt wants to know if they need to continue taking metformin, Christy added Jardiance to med list and pt was unsure about taking both

## 2020-05-16 NOTE — Telephone Encounter (Signed)
Please review and advise.

## 2020-05-18 ENCOUNTER — Ambulatory Visit (INDEPENDENT_AMBULATORY_CARE_PROVIDER_SITE_OTHER): Payer: Medicare Other | Admitting: Pharmacist

## 2020-05-18 ENCOUNTER — Other Ambulatory Visit: Payer: Self-pay

## 2020-05-18 DIAGNOSIS — E119 Type 2 diabetes mellitus without complications: Secondary | ICD-10-CM

## 2020-05-18 NOTE — Progress Notes (Signed)
    05/18/2020 Name: Melissa Paul MRN: [id number] DOB: [birth year]   S:  73 yoF Presents for diabetes evaluation, education, and management Patient was referred and last seen by Primary Care Provider on 04/26/20  Insurance coverage/medication affordability: medicare  Patient reports adherence with medications. . Current diabetes medications include: metformin . Current hypertension medications include: coreg, spiro Goal 130/80 . Current hyperlipidemia medications include: n/a, will address  Patient denies hypoglycemic events.   O:  Lab Results  Component Value Date   HGBA1C 8.1 (H) 04/26/2020     Lipid Panel     Component Value Date/Time   CHOL 156 10/19/2018 1133   TRIG 89 10/19/2018 1133   HDL 45 10/19/2018 1133   CHOLHDL 3.5 10/19/2018 1133   CHOLHDL 3.3 04/11/2007 0600   VLDL 10 04/11/2007 0600   LDLCALC 94 10/19/2018 1133     Home fasting blood sugars: n/q  2 hour post-meal/random blood sugars: n/a.   A/P:  Diabetes T2DM currently uncontolled. Vania Rea was recently added, however Wilder Glade is easier to obtain via patient assistance. Patient okay with switching.  Patient is adherent with medication, however she does have difficulty swallowing.  Encouraged patient to cut or crush diabetes medications  -Continue metformin  -Started SGLT2-I FARXIGA (generic name DAPAGLIFLOZIN) 10MG  DAILY.  Counseled Sick day rules: if sick, vomiting, have diarrhea, or  cannot drink enough fluids, you should stop taking SGLT-2 inhibitors until your symptoms go away. In rare cases, these medicines can cause diabetic ketoacidosis (DKA). DKA is acid buildup in the blood.  [place] out with patient  -Discontinued Jardiance  -Need to add statin--patient will consider starting by next PCP visit  -Extensively discussed pathophysiology of diabetes, recommended lifestyle interventions, dietary effects on blood sugar control  -Counseled on s/sx of  and management of hypoglycemia  -Next A1C anticipated 3-6 months.     Written patient instructions provided.  Total time in face to face counseling 28 minutes.   Follow up PCP Clinic Visit ON 06/27/20.   Regina Eck, PharmD, BCPS Clinical Pharmacist, [facility]  [facility] Phone [phone]

## 2020-05-21 ENCOUNTER — Other Ambulatory Visit: Payer: Self-pay | Admitting: Internal Medicine

## 2020-05-22 ENCOUNTER — Ambulatory Visit (INDEPENDENT_AMBULATORY_CARE_PROVIDER_SITE_OTHER): Payer: Medicare Other

## 2020-05-22 DIAGNOSIS — I5022 Chronic systolic (congestive) heart failure: Secondary | ICD-10-CM | POA: Diagnosis not present

## 2020-05-22 DIAGNOSIS — Z95810 Presence of automatic (implantable) cardiac defibrillator: Secondary | ICD-10-CM | POA: Diagnosis not present

## 2020-05-22 NOTE — Progress Notes (Signed)
EPIC Encounter for ICM Monitoring  Patient Name: JENESA FORESTA is a 75 y.o. female Date: 05/22/2020 Primary Care Physican: Sharion Balloon, FNP Primary Cardiologist:McDowell/[place] Electrophysiologist: Allred Bi-V Pacing:89%(04/27/2020 report) 04/28/2020 Weight:   180 lbs     Transmission reviewed.  CorVueThoracic impedancesuggestingnormal fluid levels.  Prescribed:  Furosemide80 mgTake 1 tablet (80 mg total) by mouth dailyas needed  Spirolactone 25 mg take 1 tablet daily  Eliquis 5 mg take 1 tablet twice a day  Labs: 05/02/2020 Creatinine 1.15, BUN 15, Potassium 4.1, Sodium 138 04/26/2020 Creatinine 1.36, BUN 16, Potassium 4.6, Sodium 136, GFR 40 01/31/2020 Creatinine 0.98, BUN 13, Potassium 4.3, Sodium 138, GFR 56-65 A complete set of results can be found in Results Review.  Recommendations:No changes.  Follow-up plan: ICM clinic phone appointment on6/13/2022. 91 day device clinic remote transmission6/27/2022.  EP/Cardiology Office Visits:7/27/2022with Dr.McDowell. 12/04/2020 with Dr [name]  Copy of ICM check sent to Dr.Allred.   3 month ICM trend: 05/18/2020.    [name], RN 05/22/2020 4:31 PM

## 2020-06-07 ENCOUNTER — Telehealth: Payer: Self-pay | Admitting: Pharmacist

## 2020-06-07 MED ORDER — DAPAGLIFLOZIN PROPANEDIOL 10 MG PO TABS: 10.0000 mg | ORAL_TABLET | Freq: Every day | ORAL | 3 refills | Status: DC

## 2020-06-07 NOTE — Telephone Encounter (Signed)
PATIENT APPROVED FOR [place] PATIENT ASSISTANCE Patient re-enrolled in [place] and [place] patient assistance program for [facility] for 2505 Application filled out online Medication will ship to patient's home RX escribed to [facility] with 3 refills  05/30/20 MEDICATION HAS SHIPPED TO PATIENT'S HOME

## 2020-06-21 ENCOUNTER — Other Ambulatory Visit: Payer: Self-pay | Admitting: Family

## 2020-06-21 DIAGNOSIS — F411 Generalized anxiety disorder: Secondary | ICD-10-CM

## 2020-06-21 DIAGNOSIS — G4700 Insomnia, unspecified: Secondary | ICD-10-CM

## 2020-06-27 ENCOUNTER — Other Ambulatory Visit: Payer: Self-pay

## 2020-06-27 ENCOUNTER — Encounter: Payer: Self-pay | Admitting: Family

## 2020-06-27 ENCOUNTER — Ambulatory Visit (INDEPENDENT_AMBULATORY_CARE_PROVIDER_SITE_OTHER): Payer: Medicare Other | Admitting: Family

## 2020-06-27 VITALS — BP 94/62 | HR 69 | Temp 97.3°F | Ht 62.5 in | Wt 177.0 lb

## 2020-06-27 DIAGNOSIS — I9589 Other hypotension: Secondary | ICD-10-CM | POA: Diagnosis not present

## 2020-06-27 DIAGNOSIS — E039 Hypothyroidism, unspecified: Secondary | ICD-10-CM

## 2020-06-27 DIAGNOSIS — E1165 Type 2 diabetes mellitus with hyperglycemia: Secondary | ICD-10-CM

## 2020-06-27 DIAGNOSIS — F411 Generalized anxiety disorder: Secondary | ICD-10-CM

## 2020-06-27 DIAGNOSIS — I080 Rheumatic disorders of both mitral and aortic valves: Secondary | ICD-10-CM

## 2020-06-27 DIAGNOSIS — I480 Paroxysmal atrial fibrillation: Secondary | ICD-10-CM

## 2020-06-27 DIAGNOSIS — K5900 Constipation, unspecified: Secondary | ICD-10-CM

## 2020-06-27 LAB — BAYER DCA HB A1C WAIVED: HB A1C (BAYER DCA - WAIVED): 8.7 % — ABNORMAL HIGH (ref <7.0)

## 2020-06-27 NOTE — Progress Notes (Signed)
Subjective:    Patient ID: Melissa Paul, female    DOB: 1945/07/24, 75 y.o.   MRN: [id number]  Chief Complaint  Patient presents with  . Diabetes   Pt presents to the office today for chronic follow up. She is followed by Cardiologists 3 months for CHF, Cardiomyopathy, A Fib, and ventricular tachyarrhythmia. She has a pacemaker. She takes Eliquis BID.  She has had recurrent UTI's and has seen Urologists.  Diabetes She presents for her follow-up diabetic visit. She has type 2 diabetes mellitus. Hypoglycemia symptoms include nervousness/anxiousness. Associated symptoms include fatigue and foot paresthesias. Pertinent negatives for diabetes include no blurred vision. Symptoms are stable. Diabetic complications include heart disease and peripheral neuropathy. Risk factors for coronary artery disease include dyslipidemia, diabetes mellitus, hypertension and sedentary lifestyle. She is following a generally healthy diet. Her overall blood glucose range is 140-180 mg/dl. An ACE inhibitor/angiotensin II receptor blocker is being taken.  Thyroid Problem Presents for follow-up visit. Symptoms include anxiety, constipation, depressed mood and fatigue. The symptoms have been stable.  Anxiety Presents for follow-up visit. Symptoms include depressed mood, excessive worry, irritability, nervous/anxious behavior and restlessness. Symptoms occur most days. The severity of symptoms is moderate. The quality of sleep is good.    Constipation This is a chronic problem. The current episode started more than 1 year ago. The problem has been resolved since onset. Risk factors include obesity. She has tried laxatives for the symptoms. The treatment provided moderate relief.      Review of Systems  Constitutional: Positive for fatigue and irritability.  Eyes: Negative for blurred vision.  Gastrointestinal: Positive for constipation.  Psychiatric/Behavioral: The patient is nervous/anxious.   All other  systems reviewed and are negative.      Objective:   Physical Exam Vitals reviewed.  Constitutional:      General: She is not in acute distress.    Appearance: She is well-developed.  HENT:     Head: Normocephalic and atraumatic.     Right Ear: Tympanic membrane normal.     Left Ear: Tympanic membrane normal.  Eyes:     Pupils: Pupils are equal, round, and reactive to light.  Neck:     Thyroid: No thyromegaly.  Cardiovascular:     Rate and Rhythm: Normal rate and regular rhythm.     Heart sounds: Normal heart sounds. No murmur heard.   Pulmonary:     Effort: Pulmonary effort is normal. No respiratory distress.     Breath sounds: Normal breath sounds. No wheezing.  Abdominal:     General: Bowel sounds are normal. There is no distension.     Palpations: Abdomen is soft.     Tenderness: There is no abdominal tenderness.  Musculoskeletal:        General: No tenderness. Normal range of motion.     Cervical back: Normal range of motion and neck supple.  Skin:    General: Skin is warm and dry.  Neurological:     Mental Status: She is alert and oriented to person, place, and time.     Cranial Nerves: No cranial nerve deficit.     Motor: Weakness (using cane to walk) present.     Deep Tendon Reflexes: Reflexes are normal and symmetric.  Psychiatric:        Behavior: Behavior normal.        Thought Content: Thought content normal.        Judgment: Judgment normal.  BP 94/62   Pulse 69   Temp (!) 97.3 F (36.3 C) (Temporal)   Ht 5' 2.5" (1.588 m)   Wt 177 lb (80.3 kg)   SpO2 97%   BMI 31.86 kg/m    Assessment & Plan:  [name] comes in today with chief complaint of Diabetes   Diagnosis and orders addressed:  1. MITRAL REGURGITATION - CMP14+EGFR  2. Chronic hypotension - CMP14+EGFR  3. Paroxysmal atrial fibrillation (HCC) - CMP14+EGFR  4. Hypothyroidism, unspecified type  - CMP14+EGFR  5. GAD (generalized anxiety disorder) -  CMP14+EGFR  6. Constipation, unspecified constipation type - CMP14+EGFR  7. Type 2 diabetes mellitus with hyperglycemia, without long-term current use of insulin (HCC) - CMP14+EGFR - Bayer DCA Hb A1c Waived   Labs pending Health Maintenance reviewed Diet and exercise encouraged  Follow up plan: 3 months   Evelina Dun, FNP

## 2020-06-27 NOTE — Patient Instructions (Signed)
Diabetes Mellitus and Nutrition, Adult When you have diabetes, or diabetes mellitus, it is very important to have healthy eating habits because your blood sugar (glucose) levels are greatly affected by what you eat and drink. Eating healthy foods in the right amounts, at about the same times every day, can help you:  Control your blood glucose.  Lower your risk of heart disease.  Improve your blood pressure.  Reach or maintain a healthy weight. What can affect my meal plan? Every person with diabetes is different, and each person has different needs for a meal plan. Your health care provider may recommend that you work with a dietitian to make a meal plan that is best for you. Your meal plan may vary depending on factors such as:  The calories you need.  The medicines you take.  Your weight.  Your blood glucose, blood pressure, and cholesterol levels.  Your activity level.  Other health conditions you have, such as heart or kidney disease. How do carbohydrates affect me? Carbohydrates, also called carbs, affect your blood glucose level more than any other type of food. Eating carbs naturally raises the amount of glucose in your blood. Carb counting is a method for keeping track of how many carbs you eat. Counting carbs is important to keep your blood glucose at a healthy level, especially if you use insulin or take certain oral diabetes medicines. It is important to know how many carbs you can safely have in each meal. This is different for every person. Your dietitian can help you calculate how many carbs you should have at each meal and for each snack. How does alcohol affect me? Alcohol can cause a sudden decrease in blood glucose (hypoglycemia), especially if you use insulin or take certain oral diabetes medicines. Hypoglycemia can be a life-threatening condition. Symptoms of hypoglycemia, such as sleepiness, dizziness, and confusion, are similar to symptoms of having too much  alcohol.  Do not drink alcohol if: ? Your health care provider tells you not to drink. ? You are pregnant, may be pregnant, or are planning to become pregnant.  If you drink alcohol: ? Do not drink on an empty stomach. ? Limit how much you use to:  0-1 drink a day for women.  0-2 drinks a day for men. ? Be aware of how much alcohol is in your drink. In the [place] one drink equals one 12 oz bottle of beer (355 mL), one 5 oz glass of wine (148 mL), or one 1 oz glass of hard liquor (44 mL). ? Keep yourself hydrated with water, diet soda, or unsweetened iced tea.  Keep in mind that regular soda, juice, and other mixers may contain a lot of sugar and must be counted as carbs. What are tips for following this plan? Reading food labels  Start by checking the serving size on the "Nutrition Facts" label of packaged foods and drinks. The amount of calories, carbs, fats, and other nutrients listed on the label is based on one serving of the item. Many items contain more than one serving per package.  Check the total grams (g) of carbs in one serving. You can calculate the number of servings of carbs in one serving by dividing the total carbs by 15. For example, if a food has 30 g of total carbs per serving, it would be equal to 2 servings of carbs.  Check the number of grams (g) of saturated fats and trans fats in one serving. Choose foods that have   a low amount or none of these fats.  Check the number of milligrams (mg) of salt (sodium) in one serving. Most people should limit total sodium intake to less than 2,300 mg per day.  Always check the nutrition information of foods labeled as "low-fat" or "nonfat." These foods may be higher in added sugar or refined carbs and should be avoided.  Talk to your dietitian to identify your daily goals for nutrients listed on the label. Shopping  Avoid buying canned, pre-made, or processed foods. These foods tend to be high in fat, sodium, and added  sugar.  Shop around the outside edge of the grocery store. This is where you will most often find fresh fruits and vegetables, bulk grains, fresh meats, and fresh dairy. Cooking  Use low-heat cooking methods, such as baking, instead of high-heat cooking methods like deep frying.  Cook using healthy oils, such as olive, canola, or sunflower oil.  Avoid cooking with butter, cream, or high-fat meats. Meal planning  Eat meals and snacks regularly, preferably at the same times every day. Avoid going long periods of time without eating.  Eat foods that are high in fiber, such as fresh fruits, vegetables, beans, and whole grains. Talk with your dietitian about how many servings of carbs you can eat at each meal.  Eat 4-6 oz (112-168 g) of lean protein each day, such as lean meat, chicken, fish, eggs, or tofu. One ounce (oz) of lean protein is equal to: ? 1 oz (28 g) of meat, chicken, or fish. ? 1 egg. ?  cup (62 g) of tofu.  Eat some foods each day that contain healthy fats, such as avocado, nuts, seeds, and fish.   What foods should I eat? Fruits Berries. Apples. Oranges. Peaches. Apricots. Plums. Grapes. Mango. Papaya. Pomegranate. Kiwi. Cherries. Vegetables Lettuce. Spinach. Leafy greens, including kale, chard, collard greens, and mustard greens. Beets. Cauliflower. Cabbage. Broccoli. Carrots. Green beans. Tomatoes. Peppers. Onions. Cucumbers. Brussels sprouts. Grains Whole grains, such as whole-wheat or whole-grain bread, crackers, tortillas, cereal, and pasta. Unsweetened oatmeal. Quinoa. Brown or wild rice. Meats and other proteins Seafood. Poultry without skin. Lean cuts of poultry and beef. Tofu. Nuts. Seeds. Dairy Low-fat or fat-free dairy products such as milk, yogurt, and cheese. The items listed above may not be a complete list of foods and beverages you can eat. Contact a dietitian for more information. What foods should I avoid? Fruits Fruits canned with  syrup. Vegetables Canned vegetables. Frozen vegetables with butter or cream sauce. Grains Refined white flour and flour products such as bread, pasta, snack foods, and cereals. Avoid all processed foods. Meats and other proteins Fatty cuts of meat. Poultry with skin. Breaded or fried meats. Processed meat. Avoid saturated fats. Dairy Full-fat yogurt, cheese, or milk. Beverages Sweetened drinks, such as soda or iced tea. The items listed above may not be a complete list of foods and beverages you should avoid. Contact a dietitian for more information. Questions to ask a health care provider  Do I need to meet with a diabetes educator?  Do I need to meet with a dietitian?  What number can I call if I have questions?  When are the best times to check my blood glucose? Where to find more information:  American Diabetes Association: diabetes.org  Academy of Nutrition and Dietetics: www.eatright.org  National Institute of Diabetes and Digestive and Kidney Diseases: www.niddk.nih.gov  Association of Diabetes Care and Education Specialists: www.diabeteseducator.org Summary  It is important to have healthy eating   habits because your blood sugar (glucose) levels are greatly affected by what you eat and drink.  A healthy meal plan will help you control your blood glucose and maintain a healthy lifestyle.  Your health care provider may recommend that you work with a dietitian to make a meal plan that is best for you.  Keep in mind that carbohydrates (carbs) and alcohol have immediate effects on your blood glucose levels. It is important to count carbs and to use alcohol carefully. This information is not intended to replace advice given to you by your health care provider. Make sure you discuss any questions you have with your health care provider. Document Revised: 12/15/2018 Document Reviewed: 12/15/2018 Elsevier Patient Education  2021 Elsevier Inc.  

## 2020-06-28 LAB — CMP14+EGFR
ALT: 10 IU/L (ref 0–32)
AST: 9 IU/L (ref 0–40)
Albumin/Globulin Ratio: 1.9 (ref 1.2–2.2)
Albumin: 4.7 g/dL (ref 3.7–4.7)
Alkaline Phosphatase: 78 IU/L (ref 44–121)
BUN/Creatinine Ratio: 14 (ref 12–28)
BUN: 22 mg/dL (ref 8–27)
Bilirubin Total: 0.5 mg/dL (ref 0.0–1.2)
CO2: 26 mmol/L (ref 20–29)
Calcium: 9.4 mg/dL (ref 8.7–10.3)
Chloride: 92 mmol/L — ABNORMAL LOW (ref 96–106)
Creatinine, Ser: 1.57 mg/dL — ABNORMAL HIGH (ref 0.57–1.00)
Globulin, Total: 2.5 g/dL (ref 1.5–4.5)
Glucose: 215 mg/dL — ABNORMAL HIGH (ref 65–99)
Potassium: 4.8 mmol/L (ref 3.5–5.2)
Sodium: 134 mmol/L (ref 134–144)
Total Protein: 7.2 g/dL (ref 6.0–8.5)
eGFR: 34 mL/min/{1.73_m2} — ABNORMAL LOW (ref >59)

## 2020-06-29 ENCOUNTER — Other Ambulatory Visit: Payer: Self-pay | Admitting: Family

## 2020-06-29 DIAGNOSIS — R7989 Other specified abnormal findings of blood chemistry: Secondary | ICD-10-CM

## 2020-06-29 MED ORDER — OZEMPIC (0.25 OR 0.5 MG/DOSE) 2 MG/1.5ML ~~LOC~~ SOPN: PEN_INJECTOR | SUBCUTANEOUS | 0 refills | Status: DC

## 2020-06-30 ENCOUNTER — Telehealth: Payer: Self-pay | Admitting: Family

## 2020-06-30 NOTE — Telephone Encounter (Signed)
Ozempic is going to be too expensive for patient to afford and she is totally against taking an injection.  Please advise.

## 2020-06-30 NOTE — Telephone Encounter (Signed)
Please schedule patient an appointment with Almyra Free for diabetic education and medication.

## 2020-06-30 NOTE — Telephone Encounter (Signed)
Patient aware and verbalized understanding. °

## 2020-07-03 ENCOUNTER — Ambulatory Visit (INDEPENDENT_AMBULATORY_CARE_PROVIDER_SITE_OTHER): Payer: Medicare Other

## 2020-07-03 DIAGNOSIS — I5022 Chronic systolic (congestive) heart failure: Secondary | ICD-10-CM | POA: Diagnosis not present

## 2020-07-03 DIAGNOSIS — Z95810 Presence of automatic (implantable) cardiac defibrillator: Secondary | ICD-10-CM | POA: Diagnosis not present

## 2020-07-04 ENCOUNTER — Ambulatory Visit: Payer: Medicare Other | Admitting: Pharmacist

## 2020-07-05 NOTE — Progress Notes (Signed)
EPIC Encounter for ICM Monitoring  Patient Name: Melissa Paul is a 75 y.o. female Date: 07/05/2020 Primary Care Physican: Sharion Balloon, FNP Primary Cardiologist: [facility] Electrophysiologist: [facility] Bi-V Pacing: 90%        07/05/2020 Weight: 172 lbs  AT/AF Burden: 3.3%          Spoke with patient and reports feeling well at this time. Heart failure questions reviewed. Pt asymptomatic.  She was sick last week with nausea and not able to drink or eat much.      CorVue Thoracic impedance suggesting normal fluid levels but was suggesting dryness 6/6-6/11.   Prescribed:  Furosemide 80 mg Take 1 tablet (80 mg total) by mouth daily as needed Spirolactone 25 mg take 1 tablet daily Eliquis 5 mg take 1 tablet twice a day   Labs: 05/02/2020 Creatinine 1.15, BUN 15, Potassium 4.1, Sodium 138 04/26/2020 Creatinine 1.36, BUN 16, Potassium 4.6, Sodium 136, GFR 40 01/31/2020 Creatinine 0.98, BUN 13, Potassium 4.3, Sodium 138, GFR 56-65 A complete set of results can be found in Results Review.   Recommendations:  No changes and encouraged to call if experiencing any fluid symptoms.   Follow-up plan: ICM clinic phone appointment on 08/07/2020.   91 day device clinic remote transmission 07/17/2020.    EP/Cardiology Office Visits: 08/16/2020 with Dr. Domenic Polite.  12/04/2020 with Dr [name]   Copy of ICM check sent to Dr. [name].    3 month ICM trend: 07/05/2020.    1 Year ICM trend:       [name], RN 07/05/2020 9:46 AM

## 2020-07-06 ENCOUNTER — Ambulatory Visit (INDEPENDENT_AMBULATORY_CARE_PROVIDER_SITE_OTHER): Payer: Medicare Other

## 2020-07-06 VITALS — BP 92/50 | Ht 63.0 in | Wt 173.0 lb

## 2020-07-06 DIAGNOSIS — Z0000 Encounter for general adult medical examination without abnormal findings: Secondary | ICD-10-CM

## 2020-07-06 NOTE — Patient Instructions (Signed)
Melissa Paul , Thank you for taking time to come for your Medicare Wellness Visit. I appreciate your ongoing commitment to your health goals. Please review the following plan we discussed and let me know if I can assist you in the future.   Screening recommendations/referrals: Colonoscopy: No longer required Mammogram: No longer required  Bone Density: Done 11/15/2019 - Repeat every 2 years Recommended yearly ophthalmology/optometry visit for glaucoma screening and checkup Recommended yearly dental visit for hygiene and checkup  Vaccinations: Influenza vaccine: Due every fall Pneumococcal vaccine: one dose done 07/11/2009; Due for other Tdap vaccine: Due every 10 years Shingles vaccine: Due (2 doses 2-6 months apart) Shingrix discussed. Please contact your pharmacy for coverage information if interested Covid-19: Declined  Advanced directives: Please bring a copy of your health care power of attorney and living will to the office to be added to your chart at your convenience.  Conditions/risks identified: Continue practicing fall prevention. Ensure you are drinking 8 glasses of water each day. To combat nausea, aim for smaller more frequent meals.  Next appointment: Follow up in one year for your annual wellness visit    Preventive Care 65 Years and Older, Female Preventive care refers to lifestyle choices and visits with your health care provider that can promote health and wellness. What does preventive care include? A yearly physical exam. This is also called an annual well check. Dental exams once or twice a year. Routine eye exams. Ask your health care provider how often you should have your eyes checked. Personal lifestyle choices, including: Daily care of your teeth and gums. Regular physical activity. Eating a healthy diet. Avoiding tobacco and drug use. Limiting alcohol use. Practicing safe sex. Taking low-dose aspirin every day. Taking vitamin and mineral supplements as  recommended by your health care provider. What happens during an annual well check? The services and screenings done by your health care provider during your annual well check will depend on your age, overall health, lifestyle risk factors, and family history of disease. Counseling  Your health care provider may ask you questions about your: Alcohol use. Tobacco use. Drug use. Emotional well-being. Home and relationship well-being. Sexual activity. Eating habits. History of falls. Memory and ability to understand (cognition). Work and work Statistician. Reproductive health. Screening  You may have the following tests or measurements: Height, weight, and BMI. Blood pressure. Lipid and cholesterol levels. These may be checked every 5 years, or more frequently if you are over 36 years old. Skin check. Lung cancer screening. You may have this screening every year starting at age 76 if you have a 30-pack-year history of smoking and currently smoke or have quit within the past 15 years. Fecal occult blood test (FOBT) of the stool. You may have this test every year starting at age 30. Flexible sigmoidoscopy or colonoscopy. You may have a sigmoidoscopy every 5 years or a colonoscopy every 10 years starting at age 34. Hepatitis C blood test. Hepatitis B blood test. Sexually transmitted disease (STD) testing. Diabetes screening. This is done by checking your blood sugar (glucose) after you have not eaten for a while (fasting). You may have this done every 1-3 years. Bone density scan. This is done to screen for osteoporosis. You may have this done starting at age 52. Mammogram. This may be done every 1-2 years. Talk to your health care provider about how often you should have regular mammograms. Talk with your health care provider about your test results, treatment options, and if necessary, the  need for more tests. Vaccines  Your health care provider may recommend certain vaccines, such  as: Influenza vaccine. This is recommended every year. Tetanus, diphtheria, and acellular pertussis (Tdap, Td) vaccine. You may need a Td booster every 10 years. Zoster vaccine. You may need this after age 34. Pneumococcal 13-valent conjugate (PCV13) vaccine. One dose is recommended after age 87. Pneumococcal polysaccharide (PPSV23) vaccine. One dose is recommended after age 55. Talk to your health care provider about which screenings and vaccines you need and how often you need them. This information is not intended to replace advice given to you by your health care provider. Make sure you discuss any questions you have with your health care provider. Document Released: 02/03/2015 Document Revised: 09/27/2015 Document Reviewed: 11/08/2014 Elsevier Interactive Patient Education  2017 [place] Prevention in the Home Falls can cause injuries. They can happen to people of all ages. There are many things you can do to make your home safe and to help prevent falls. What can I do on the outside of my home? Regularly fix the edges of walkways and driveways and fix any cracks. Remove anything that might make you trip as you walk through a door, such as a raised step or threshold. Trim any bushes or trees on the path to your home. Use bright outdoor lighting. Clear any walking paths of anything that might make someone trip, such as rocks or tools. Regularly check to see if handrails are loose or broken. Make sure that both sides of any steps have handrails. Any raised decks and porches should have guardrails on the edges. Have any leaves, snow, or ice cleared regularly. Use sand or salt on walking paths during winter. Clean up any spills in your garage right away. This includes oil or grease spills. What can I do in the bathroom? Use night lights. Install grab bars by the toilet and in the tub and shower. Do not use towel bars as grab bars. Use non-skid mats or decals in the tub or  shower. If you need to sit down in the shower, use a plastic, non-slip stool. Keep the floor dry. Clean up any water that spills on the floor as soon as it happens. Remove soap buildup in the tub or shower regularly. Attach bath mats securely with double-sided non-slip rug tape. Do not have throw rugs and other things on the floor that can make you trip. What can I do in the bedroom? Use night lights. Make sure that you have a light by your bed that is easy to reach. Do not use any sheets or blankets that are too big for your bed. They should not hang down onto the floor. Have a firm chair that has side arms. You can use this for support while you get dressed. Do not have throw rugs and other things on the floor that can make you trip. What can I do in the kitchen? Clean up any spills right away. Avoid walking on wet floors. Keep items that you use a lot in easy-to-reach places. If you need to reach something above you, use a strong step stool that has a grab bar. Keep electrical cords out of the way. Do not use floor polish or wax that makes floors slippery. If you must use wax, use non-skid floor wax. Do not have throw rugs and other things on the floor that can make you trip. What can I do with my stairs? Do not leave any items on the  stairs. Make sure that there are handrails on both sides of the stairs and use them. Fix handrails that are broken or loose. Make sure that handrails are as long as the stairways. Check any carpeting to make sure that it is firmly attached to the stairs. Fix any carpet that is loose or worn. Avoid having throw rugs at the top or bottom of the stairs. If you do have throw rugs, attach them to the floor with carpet tape. Make sure that you have a light switch at the top of the stairs and the bottom of the stairs. If you do not have them, ask someone to add them for you. What else can I do to help prevent falls? Wear shoes that: Do not have high heels. Have  rubber bottoms. Are comfortable and fit you well. Are closed at the toe. Do not wear sandals. If you use a stepladder: Make sure that it is fully opened. Do not climb a closed stepladder. Make sure that both sides of the stepladder are locked into place. Ask someone to hold it for you, if possible. Clearly mark and make sure that you can see: Any grab bars or handrails. First and last steps. Where the edge of each step is. Use tools that help you move around (mobility aids) if they are needed. These include: Canes. Walkers. Scooters. Crutches. Turn on the lights when you go into a dark area. Replace any light bulbs as soon as they burn out. Set up your furniture so you have a clear path. Avoid moving your furniture around. If any of your floors are uneven, fix them. If there are any pets around you, be aware of where they are. Review your medicines with your doctor. Some medicines can make you feel dizzy. This can increase your chance of falling. Ask your doctor what other things that you can do to help prevent falls. This information is not intended to replace advice given to you by your health care provider. Make sure you discuss any questions you have with your health care provider. Document Released: 11/03/2008 Document Revised: 06/15/2015 Document Reviewed: 02/11/2014 Elsevier Interactive Patient Education  2017 [employer].

## 2020-07-06 NOTE — Progress Notes (Signed)
Subjective:   Melissa Paul is a 75 y.o. female who presents for Medicare Annual (Subsequent) preventive examination.  Virtual Visit via Telephone Note  I connected with  Melissa Paul on 07/06/20 at 11:15 AM EDT by telephone and verified that I am speaking with the correct person using two identifiers.  Location: Patient: Home Provider: [facility] Persons participating in the virtual visit: patient and her daughter [place]   I discussed the limitations, risks, security and privacy concerns of performing an evaluation and management service by telephone and the availability of in person appointments. The patient expressed understanding and agreed to proceed.  Interactive audio and video telecommunications were attempted between this nurse and patient, however failed, due to patient having technical difficulties OR patient did not have access to video capability.  We continued and completed visit with audio only.  Some vital signs may be absent or patient reported.   Melissa Paul E Melissa Cottman, LPN   Review of Systems     Cardiac Risk Factors include: advanced age (>89mn, >>75women);obesity (BMI >30kg/m2);sedentary lifestyle;diabetes mellitus;Other (see comment), Risk factor comments: hypotension     Objective:    Today's Vitals   07/06/20 1136  BP: (!) 92/50  Weight: 173 lb (78.5 kg)  Height: '5\' 3"'  (1.6 m)  PainSc: 7    Body mass index is 30.65 kg/m.  Advanced Directives 07/06/2020 02/22/2020 09/10/2019 08/24/2019 04/21/2019 03/26/2019 02/27/2017  Does Patient Have a Medical Advance Directive? Yes - Yes Yes No;Yes Yes Yes  Type of AParamedicof AHeronLiving will - Living will Living will Living will Living will HLithium Does patient want to make changes to medical advance directive? - - - No - Patient declined - No - Patient declined -  Copy of [place]in Chart? No - copy requested - - - - No - copy  requested -  Would patient like information on creating a medical advance directive? - Yes (ED - Information included in AVS) - - No - Patient declined - -    Current Medications (verified) Outpatient Encounter Medications as of 07/06/2020  Medication Sig   acetaminophen (TYLENOL 8 HOUR) 650 MG CR tablet Take 650 mg by mouth every 8 (eight) hours as needed for pain.   apixaban (ELIQUIS) 5 MG TABS tablet Take 1 tablet (5 mg total) by mouth 2 (two) times daily.   blood glucose meter kit and supplies Dispense based on patient and insurance preference. Use up to four times daily as directed. (FOR ICD-10 E10.9, E11.9).   carvedilol (COREG) 6.25 MG tablet TAKE 3 TABLETS BY MOUTH TWICE DAILY   cetirizine (ZYRTEC) 10 MG tablet Take 10 mg by mouth daily.   clobetasol cream (TEMOVATE) 0.05 % APPLY TO AFFECTED AREAS TWICE DAILY AS NEEDED NOT TO FACE, GROIN, UNDERARMS   dapagliflozin propanediol (FARXIGA) 10 MG TABS tablet Take 1 tablet (10 mg total) by mouth daily.   digoxin (LANOXIN) 0.125 MG tablet Take 0.0625 mg by mouth daily.   estradiol (ESTRACE) 0.1 MG/GM vaginal cream APPLY 1 GRAM OF CREAM IN THE VAGINA NIGHTLY FOR 14 DAYS, THEN TWICE A WEEK THEREAFTER   furosemide (LASIX) 80 MG tablet TAKE 1 TABLET BY MOUTH ONCE DAILY AS NEEDED FOR  EXTRA  FLUID   hydrOXYzine (VISTARIL) 25 MG capsule Take 1 capsule (25 mg total) by mouth 3 (three) times daily.   levothyroxine (EUTHYROX) 88 MCG tablet TAKE 1 TABLET BY MOUTH ONCE DAILY BEFORE BREAKFAST *  NEED LABWORK*   metFORMIN (GLUCOPHAGE XR) 750 MG 24 hr tablet Take 2 tablets (1,500 mg total) by mouth daily with breakfast.   pantoprazole (PROTONIX) 40 MG tablet Take 1 tablet (40 mg total) by mouth daily before breakfast.   Semaglutide,0.25 or 0.5MG/DOS, (OZEMPIC, 0.25 OR 0.5 MG/DOSE,) 2 MG/1.5ML SOPN Inject 0.25 mg into the skin once a week for 28 days, THEN 0.5 mg once a week for 28 days. (Patient not taking: Reported on 07/06/2020)   nystatin (MYCOSTATIN) 100000  UNIT/ML suspension Take 5 mLs (500,000 Units total) by mouth 4 (four) times daily. (Patient not taking: No sig reported)   spironolactone (ALDACTONE) 25 MG tablet Take 1 tablet (25 mg total) by mouth at bedtime. Needs appt for refills (Patient not taking: Reported on 07/06/2020)   No facility-administered encounter medications on file as of 07/06/2020.    Allergies (verified) Buspar [buspirone], Lexapro [escitalopram oxalate], Bactrim, Cephalexin, Chlorhexidine gluconate, Clindamycin, Contrast media [iodinated diagnostic agents], Doxycycline, Latex, Levofloxacin, Nitrofurantoin monohyd macro, and Penicillins   History: Past Medical History:  Diagnosis Date   Anxiety    Chronic systolic heart failure (HCC)    Constipation    Cyst of right kidney 11/12/2017   Glucose intolerance (pre-diabetes)    Hyperlipemia    Hypothyroidism    LBBB (left bundle branch block)    Mitral regurgitation    Mild to Moderate   Nonischemic cardiomyopathy ([place] [address])    LVEF 35-40% by echo 8/13   Paroxysmal atrial fibrillation (HCC)    Brief episodes by device interrogation   UTI (urinary tract infection)    Recurrent   Ventricular tachycardia ([place])    ICD therapy for VT   Past Surgical History:  Procedure Laterality Date   APPENDECTOMY     BIV ICD GENERATOR CHANGEOUT N/A 07/11/2016   Procedure: BiV ICD Generator Changeout;  Surgeon: Thompson Grayer, MD;  Location: [place] [facility];  Service: Cardiovascular;  Laterality: N/A;   CARDIAC DEFIBRILLATOR PLACEMENT  09/2009   St. Jude BiV ICD by Dr. Sandria Manly class III   CHOLECYSTECTOMY     has one ovary left     KNEE ARTHROSCOPY     Left   LEFT AND RIGHT HEART CATHETERIZATION WITH CORONARY ANGIOGRAM N/A 04/12/2014   Procedure: LEFT AND RIGHT HEART CATHETERIZATION WITH CORONARY ANGIOGRAM;  Surgeon: Leonie Man, MD;  Location: [facility];  Service: Cardiovascular;  Laterality: N/A;   Left breast biopsy x 2     no maliganancy   PACEMAKER IMPLANT      RIGHT/LEFT HEART CATH AND CORONARY ANGIOGRAPHY N/A 03/26/2019   Procedure: RIGHT/LEFT HEART CATH AND CORONARY ANGIOGRAPHY;  Surgeon: [name], MD;  Location: [place] [facility];  Service: Cardiovascular;  Laterality: N/A;   TOTAL ABDOMINAL HYSTERECTOMY     (R) ovary removed   Family History  Problem Relation Age of Onset   CVA Mother        multiple   Heart Problems Mother        "weak heart"   Heart disease Maternal Grandmother    Heart disease Maternal Grandfather    Cancer Father        Stomach and eshpogus   Diabetes Neg Hx    Hypertension Neg Hx    Coronary artery disease Neg Hx    Social History   Socioeconomic History   Marital status: Widowed    Spouse name: Not on file   Number of children: 2   Years of education: Not on file  Highest education level: Not on file  Occupational History   Not on file  Tobacco Use   Smoking status: Former    Packs/day: 0.30    Years: 20.00    Pack years: 6.00    Types: Cigarettes    Start date: 08/03/1949    Quit date: 01/22/1996    Years since quitting: 24.4   Smokeless tobacco: Never  Vaping Use   Vaping Use: Never used  Substance and Sexual Activity   Alcohol use: No    Alcohol/week: 0.0 standard drinks   Drug use: No   Sexual activity: Not on file  Other Topics Concern   Not on file  Social History Narrative   Her daughter, Terrence Dupont lives with her - takes her to all visits and cooks for her.   Social Determinants of Health   Financial Resource Strain: Low Risk    Difficulty of Paying Living Expenses: Not very hard  Food Insecurity: No Food Insecurity   Worried About Charity fundraiser in the Last Year: Never true   Ran Out of Food in the Last Year: Never true  Transportation Needs: No Transportation Needs   Lack of Transportation (Medical): No   Lack of Transportation (Non-Medical): No  Physical Activity: Insufficiently Active   Days of Exercise per Week: 7 days   Minutes of Exercise per Session: 10 min   Stress: Stress Concern Present   Feeling of Stress : To some extent  Social Connections: Moderately Isolated   Frequency of Communication with Friends and Family: Three times a week   Frequency of Social Gatherings with Friends and Family: Once a week   Attends Religious Services: 1 to 4 times per year   Active Member of [employer] or Organizations: No   Attends Archivist Meetings: Never   Marital Status: Widowed    Tobacco Counseling Counseling given: Not Answered   Clinical Intake:  Pre-visit preparation completed: Yes  Pain : 0-10 Pain Score: 7  Pain Type: Chronic pain Pain Location: Generalized Pain Descriptors / Indicators: Aching, Sore, Discomfort Pain Onset: More than a month ago Pain Frequency: Intermittent     BMI - recorded: 30.65 Nutritional Status: BMI > 30  Obese Nutritional Risks: Nausea/ vomitting/ diarrhea Diabetes: Yes CBG done?: No Did pt. bring in CBG monitor from home?: No  How often do you need to have someone help you when you read instructions, pamphlets, or other written materials from your doctor or pharmacy?: 1 - Never  Nutrition Risk Assessment:  Has the patient had any N/V/D within the last 2 months?  Yes  Does the patient have any non-healing wounds?  No  Has the patient had any unintentional weight loss or weight gain?  Yes   Diabetes:  Is the patient diabetic?  Yes  If diabetic, was a CBG obtained today?  No  Did the patient bring in their glucometer from home?  No  How often do you monitor your CBG's? Daily - this morning 178 per patient.   Financial Strains and Diabetes Management:  Are you having any financial strains with the device, your supplies or your medication? Yes .  Does the patient want to be seen by Chronic Care Management for management of their diabetes?  No  Would the patient like to be referred to a Nutritionist or for Diabetic Management?  No   Diabetic Exams:  Diabetic Eye Exam: Completed 08/27/2018.  Overdue for diabetic eye exam. Pt has been advised about the importance in completing  this exam. She will make another appt soon.  Diabetic Foot Exam: Completed 04/26/20. Pt has been advised about the importance in completing this exam. Pt is scheduled for diabetic foot exam on 08/29/20.    Interpreter Needed?: No  Information entered by :: Daleah Coulson, LPN   Activities of Daily Living In your present state of health, do you have any difficulty performing the following activities: 07/06/2020 08/24/2019  Hearing? N N  Vision? N N  Difficulty concentrating or making decisions? N N  Walking or climbing stairs? Y Y  Dressing or bathing? Y N  Comment she takes sink baths only -  Doing errands, shopping? Y N  Preparing Food and eating ? Y -  Comment cannot stand long enough to cook - her daughter cooks -  Using the Toilet? N -  In the past six months, have you accidently leaked urine? Y -  Do you have problems with loss of bowel control? N -  Managing your Medications? N -  Managing your Finances? N -  Housekeeping or managing your Housekeeping? Y -  Comment daughter helps -  Some recent data might be hidden    Patient Care Team: Sharion Balloon, FNP as PCP - General (Family Medicine) Thompson Grayer, MD as PCP - Electrophysiology (Cardiology) Satira Sark, MD as PCP - Cardiology (Cardiology)  Indicate any recent Medical Services you may have received from other than [facility] providers in the past year (date may be approximate).     Assessment:   This is a routine wellness examination for [place].  Hearing/Vision screen Hearing Screening - Comments:: Denies hearing difficulties  Vision Screening - Comments:: Wears eyeglasses - up to date with annual eye exam with Dr Marin Comment in [place]   Dietary issues and exercise activities discussed: Current Exercise Habits: The patient does not participate in regular exercise at present, Exercise limited by: orthopedic condition(s);cardiac  condition(s)   Goals Addressed             This Visit's Progress    Have 3 meals a day   Worsening    Prevent falls   On track      Depression Screen PHQ 2/9 Scores 07/06/2020 06/27/2020 04/26/2020 10/13/2019 08/19/2019 07/16/2019 04/21/2019  PHQ - 2 Score 1 0 0 0 0 0 0  PHQ- 9 Score 7 - - - - - -    Fall Risk Fall Risk  07/06/2020 06/27/2020 04/26/2020 10/13/2019 08/19/2019  Falls in the past year? '1 1 1 1 ' 0  Number falls in past yr: '1 1 1 ' 0 0  Comment - - - - -  Injury with Fall? '1 1 1 1 1  ' Risk for fall due to : History of fall(s);Impaired balance/gait;Orthopedic patient;Impaired vision;Medication side effect History of fall(s) History of fall(s) Impaired mobility Impaired mobility;Impaired balance/gait  Follow up Education provided;Falls prevention discussed Education provided Education provided Education provided -    FALL RISK PREVENTION PERTAINING TO THE HOME:  Any stairs in or around the home? Yes  If so, are there any without handrails? No  Home free of loose throw rugs in walkways, pet beds, electrical cords, etc? Yes  Adequate lighting in your home to reduce risk of falls? Yes   ASSISTIVE DEVICES UTILIZED TO PREVENT FALLS:  Life alert? No  Use of a cane, walker or w/c? Yes  Grab bars in the bathroom? No  Shower chair or bench in shower? No  Elevated toilet seat or a handicapped toilet? No   TIMED  UP AND GO:  Was the test performed? No . Telephonic visit  Cognitive Function:     6CIT Screen 04/21/2019  What Year? 0 points  What month? 0 points  What time? 0 points  Count back from 20 0 points  Months in reverse 0 points    Immunizations Immunization History  Administered Date(s) Administered   Influenza,inj,Quad PF,6+ Mos 11/12/2017   Influenza-Unspecified 11/11/2008, 01/08/2011   Pneumococcal Polysaccharide-23 07/11/2009    TDAP status: Due, Education has been provided regarding the importance of this vaccine. Advised may receive this vaccine at local  pharmacy or Health Dept. Aware to provide a copy of the vaccination record if obtained from local pharmacy or Health Dept. Verbalized acceptance and understanding.  Flu Vaccine status: Declined, Education has been provided regarding the importance of this vaccine but patient still declined. Advised may receive this vaccine at local pharmacy or Health Dept. Aware to provide a copy of the vaccination record if obtained from local pharmacy or Health Dept. Verbalized acceptance and understanding.  Pneumococcal vaccine status: Declined,  Education has been provided regarding the importance of this vaccine but patient still declined. Advised may receive this vaccine at local pharmacy or Health Dept. Aware to provide a copy of the vaccination record if obtained from local pharmacy or Health Dept. Verbalized acceptance and understanding.   Covid-19 vaccine status: Declined, Education has been provided regarding the importance of this vaccine but patient still declined. Advised may receive this vaccine at local pharmacy or Health Dept.or vaccine clinic. Aware to provide a copy of the vaccination record if obtained from local pharmacy or Health Dept. Verbalized acceptance and understanding.  Qualifies for Shingles Vaccine? Yes   Zostavax completed No   Shingrix Completed?: No.    Education has been provided regarding the importance of this vaccine. Patient has been advised to call insurance company to determine out of pocket expense if they have not yet received this vaccine. Advised may also receive vaccine at local pharmacy or Health Dept. Verbalized acceptance and understanding.  Screening Tests Health Maintenance  Topic Date Due   OPHTHALMOLOGY EXAM  08/27/2019   COVID-19 Vaccine (1) 07/13/2020 (Originally 08/03/1950)   Zoster Vaccines- Shingrix (1 of 2) 09/27/2020 (Originally 08/04/1964)   TETANUS/TDAP  04/26/2021 (Originally 08/04/1964)   PNA vac Low Risk Adult (2 of 2 - PCV13) 04/26/2021 (Originally  07/12/2010)   INFLUENZA VACCINE  08/21/2020   URINE MICROALBUMIN  10/12/2020   HEMOGLOBIN A1C  12/27/2020   FOOT EXAM  04/26/2021   DEXA SCAN  11/14/2021   Hepatitis C Screening  Completed   HPV VACCINES  Aged Out    Health Maintenance  Health Maintenance Due  Topic Date Due   OPHTHALMOLOGY EXAM  08/27/2019    Colorectal cancer screening: No longer required.   Mammogram status: No longer required due to age and patient declines.  Bone Density status: Completed 11/15/2019. Results reflect: Bone density results: OSTEOPOROSIS. Repeat every 2 years.  Lung Cancer Screening: (Low Dose CT Chest recommended if Age 17-80 years, 30 pack-year currently smoking OR have quit w/in 15years.) does not qualify.   Additional Screening:  Hepatitis C Screening: does qualify; Completed 05/07/2018  Vision Screening: Recommended annual ophthalmology exams for early detection of glaucoma and other disorders of the eye. Is the patient up to date with their annual eye exam?  No  Who is the provider or what is the name of the office in which the patient attends annual eye exams? Dr Marin Comment in [place] If  pt is not established with a provider, would they like to be referred to a provider to establish care? No .   Dental Screening: Recommended annual dental exams for proper oral hygiene  Community Resource Referral / Chronic Care Management: CRR required this visit?  No   CCM required this visit?  No      Plan:     I have personally reviewed and noted the following in the patient's chart:   Medical and social history Use of alcohol, tobacco or illicit drugs  Current medications and supplements including opioid prescriptions.  Functional ability and status Nutritional status Physical activity Advanced directives List of other physicians Hospitalizations, surgeries, and ER visits in previous 12 months Vitals Screenings to include cognitive, depression, and falls Referrals and appointments  In  addition, I have reviewed and discussed with patient certain preventive protocols, quality metrics, and best practice recommendations. A written personalized care plan for preventive services as well as general preventive health recommendations were provided to patient.     Sandrea Hammond, LPN   [id number]/41/7127   Nurse Notes: She has a spot on top of head that hurts badly (NKI or falls) and she has been feeling very dizzy, leaning to the right when she tries to walk - has chronic dizziness, but this is much worse since last week. Barely eating, lots of nausea. Sent note to Shelah Lewandowsky asking about nausea med.

## 2020-07-10 ENCOUNTER — Ambulatory Visit (INDEPENDENT_AMBULATORY_CARE_PROVIDER_SITE_OTHER): Payer: Medicare Other | Admitting: Gastroenterology

## 2020-07-10 ENCOUNTER — Telehealth: Payer: Self-pay | Admitting: Family

## 2020-07-10 NOTE — Telephone Encounter (Signed)
Pt had [facility]V on the phone last week and had mentioned some dizziness, nausea, being off balance and pain in the top of the head. Alyse Low had wanted pt to be seen in person today but we didn't have any openings so pt was advised to go to urgent Care for evaluation and pt declined and says she just wants to see [facility]. Pt scheduled with [facility] tomorrow at 11:40 and advised if symptoms worsen to go to [facility] or ED for evaluation and pt voiced understanding.

## 2020-07-11 ENCOUNTER — Other Ambulatory Visit: Payer: Self-pay

## 2020-07-11 ENCOUNTER — Encounter: Payer: Self-pay | Admitting: Family

## 2020-07-11 ENCOUNTER — Ambulatory Visit (INDEPENDENT_AMBULATORY_CARE_PROVIDER_SITE_OTHER): Payer: Medicare Other | Admitting: Family

## 2020-07-11 VITALS — BP 100/58 | HR 63 | Temp 97.4°F | Ht 63.0 in | Wt 180.1 lb

## 2020-07-11 DIAGNOSIS — B370 Candidal stomatitis: Secondary | ICD-10-CM | POA: Diagnosis not present

## 2020-07-11 DIAGNOSIS — E1165 Type 2 diabetes mellitus with hyperglycemia: Secondary | ICD-10-CM

## 2020-07-11 DIAGNOSIS — R42 Dizziness and giddiness: Secondary | ICD-10-CM | POA: Diagnosis not present

## 2020-07-11 MED ORDER — MECLIZINE HCL 50 MG PO TABS: 50.0000 mg | ORAL_TABLET | Freq: Three times a day (TID) | ORAL | 2 refills | Status: DC | PRN

## 2020-07-11 MED ORDER — MAGIC MOUTHWASH W/LIDOCAINE: 10.0000 mL | Freq: Three times a day (TID) | ORAL | 0 refills | Status: DC | PRN

## 2020-07-11 MED ORDER — DAPAGLIFLOZIN PROPANEDIOL 5 MG PO TABS: 5.0000 mg | ORAL_TABLET | Freq: Every day | ORAL | 3 refills | Status: DC

## 2020-07-11 NOTE — Patient Instructions (Signed)
Dizziness Dizziness is a common problem. It is a feeling of unsteadiness or light-headedness. You may feel like you are about to faint. Dizziness can lead to injury if you stumble or fall. Anyone can become dizzy, but dizziness is more common in older adults. This condition can be caused by a number of things, including medicines, dehydration, or illness. Follow these instructions at home: Eating and drinking  Drink enough fluid to keep your urine pale yellow. This helps to keep you from becoming dehydrated. Try to drink more clear fluids, such as water. Do not drink alcohol. Limit your caffeine intake if told to do so by your health care provider. Check ingredients and nutrition facts to see if a food or beverage contains caffeine. Limit your salt (sodium) intake if told to do so by your health care provider. Check ingredients and nutrition facts to see if a food or beverage contains sodium. Activity  Avoid making quick movements. Rise slowly from chairs and steady yourself until you feel okay. In the morning, first sit up on the side of the bed. When you feel okay, stand slowly while you hold onto something until you know that your balance is good. If you need to stand in one place for a long time, move your legs often. Tighten and relax the muscles in your legs while you are standing. Do not drive or use machinery if you feel dizzy. Avoid bending down if you feel dizzy. Place items in your home so that they are easy for you to reach without leaning over. Lifestyle Do not use any products that contain nicotine or tobacco. These products include cigarettes, chewing tobacco, and vaping devices, such as e-cigarettes. If you need help quitting, ask your health care provider. Try to reduce your stress level by using methods such as yoga or meditation. Talk with your health care provider if you need help to manage your stress. General instructions Watch your dizziness for any changes. Take  over-the-counter and prescription medicines only as told by your health care provider. Talk with your health care provider if you think that your dizziness is caused by a medicine that you are taking. Tell a friend or a family member that you are feeling dizzy. If he or she notices any changes in your behavior, have this person call your health care provider. Keep all follow-up visits. This is important. Contact a health care provider if: Your dizziness does not go away or you have new symptoms. Your dizziness or light-headedness gets worse. You feel nauseous. You have reduced hearing. You have a fever. You have neck pain or a stiff neck. Your dizziness leads to an injury or a fall. Get help right away if: You vomit or have diarrhea and are unable to eat or drink anything. You have problems talking, walking, swallowing, or using your arms, hands, or legs. You feel generally weak. You have any bleeding. You are not thinking clearly or you have trouble forming sentences. It may take a friend or family member to notice this. You have chest pain, abdominal pain, shortness of breath, or sweating. Your vision changes or you develop a severe headache. These symptoms may represent a serious problem that is an emergency. Do not wait to see if the symptoms will go away. Get medical help right away. Call your local emergency services (911 in the [place] Do not drive yourself to the hospital. Summary Dizziness is a feeling of unsteadiness or light-headedness. This condition can be caused by a number of   things, including medicines, dehydration, or illness. Anyone can become dizzy, but dizziness is more common in older adults. Drink enough fluid to keep your urine pale yellow. Do not drink alcohol. Avoid making quick movements if you feel dizzy. Monitor your dizziness for any changes. This information is not intended to replace advice given to you by your health care provider. Make sure you discuss any  questions you have with your health care provider. Document Revised: 12/13/2019 Document Reviewed: 12/13/2019 Elsevier Patient Education  2022 [employer]  

## 2020-07-11 NOTE — Progress Notes (Signed)
Subjective:    Patient ID: Melissa Paul, female    DOB: [birth year], 75 y.o.   MRN: [id number]  Chief Complaint  Patient presents with   Dizziness   PT presents to the office today for dizziness. She reports she has chronic dizziness since starting Coreg 18.75 mg  BID, spironolactone 25 mg, and digoxin daily. However, over the last two weeks it has worsen to the put where it is hard to get out bed.   She reports her tongue has a white coating. She has been using nystatin solution without relief.  Dizziness This is a recurrent problem. The problem occurs constantly. The problem has been gradually worsening. Associated symptoms include fatigue and weakness. Pertinent negatives include no nausea. The symptoms are aggravated by standing and bending. She has tried lying down and drinking for the symptoms. The treatment provided mild relief.     Review of Systems  Constitutional:  Positive for fatigue.  Gastrointestinal:  Negative for nausea.  Neurological:  Positive for dizziness and weakness.  All other systems reviewed and are negative.     Objective:   Physical Exam Vitals reviewed.  Constitutional:      General: She is not in acute distress.    Appearance: She is well-developed.  HENT:     Head: Normocephalic and atraumatic.     Right Ear: Tympanic membrane normal.     Left Ear: Tympanic membrane normal.  Eyes:     Pupils: Pupils are equal, round, and reactive to light.  Neck:     Thyroid: No thyromegaly.  Cardiovascular:     Rate and Rhythm: Normal rate and regular rhythm.     Heart sounds: Normal heart sounds. No murmur heard. Pulmonary:     Effort: Pulmonary effort is normal. No respiratory distress.     Breath sounds: Normal breath sounds. No wheezing.  Abdominal:     General: Bowel sounds are normal. There is no distension.     Palpations: Abdomen is soft.     Tenderness: There is no abdominal tenderness.  Musculoskeletal:        General: No tenderness. Normal  range of motion.     Cervical back: Normal range of motion and neck supple.     Right lower leg: Edema present.     Left lower leg: Edema present.  Skin:    General: Skin is warm and dry.  Neurological:     Mental Status: She is alert and oriented to person, place, and time.     Cranial Nerves: No cranial nerve deficit.     Motor: Weakness present.     Gait: Gait abnormal (using cane to walk).     Deep Tendon Reflexes: Reflexes are normal and symmetric.  Psychiatric:        Behavior: Behavior normal.        Thought Content: Thought content normal.        Judgment: Judgment normal.     BP (!) 100/58   Pulse 63   Temp (!) 97.4 F (36.3 C) (Oral)   Ht _0  (1.6 m)   Wt 180 lb 2 oz (81.7 kg)   SpO2 98%   BMI 31.91 kg/m       Assessment & Plan:  Melissa Paul comes in today with chief complaint of Dizziness   Diagnosis and orders addressed:  1. Dizziness - BMP8+EGFR - CBC with Differential/Platelet  2. Oral thrush - BMP8+EGFR - CBC with Differential/Platelet - magic mouthwash w/lidocaine SOLN; Take  10 mLs by mouth 3 (three) times daily as needed for mouth pain.  Dispense: 250 mL; Refill: 0  3. Type 2 diabetes mellitus with hyperglycemia, without long-term current use of insulin (HCC) - dapagliflozin propanediol (FARXIGA) 5 MG TABS tablet; Take 1 tablet (5 mg total) by mouth daily before breakfast.  Dispense: 90 tablet; Refill: 3  After reviewing her chart, patient started on Farxiga 2-3 weeks ago. We will decrease this to 5 mg from 10 mg.  Force fluids Strict low carb diet Fall precautions discussed  Antivert given as needed for dizziness  Labs pending Health Maintenance reviewed Diet and exercise encouraged  Follow up plan: 1 month  Evelina Dun, FNP

## 2020-07-12 LAB — BMP8+EGFR
BUN/Creatinine Ratio: 11 — ABNORMAL LOW (ref 12–28)
BUN: 12 mg/dL (ref 8–27)
CO2: 26 mmol/L (ref 20–29)
Calcium: 9.4 mg/dL (ref 8.7–10.3)
Chloride: 98 mmol/L (ref 96–106)
Creatinine, Ser: 1.13 mg/dL — ABNORMAL HIGH (ref 0.57–1.00)
Glucose: 183 mg/dL — ABNORMAL HIGH (ref 65–99)
Potassium: 4 mmol/L (ref 3.5–5.2)
Sodium: 139 mmol/L (ref 134–144)
eGFR: 50 mL/min/{1.73_m2} — ABNORMAL LOW (ref >59)

## 2020-07-12 LAB — CBC WITH DIFFERENTIAL/PLATELET
Basophils Absolute: 0 10*3/uL (ref 0.0–0.2)
Basos: 1 %
EOS (ABSOLUTE): 0.1 10*3/uL (ref 0.0–0.4)
Eos: 1 %
Hematocrit: 35.4 % (ref 34.0–46.6)
Hemoglobin: 11.7 g/dL (ref 11.1–15.9)
Immature Grans (Abs): 0.1 10*3/uL (ref 0.0–0.1)
Immature Granulocytes: 1 %
Lymphocytes Absolute: 2.2 10*3/uL (ref 0.7–3.1)
Lymphs: 34 %
MCH: 27.9 pg (ref 26.6–33.0)
MCHC: 33.1 g/dL (ref 31.5–35.7)
MCV: 84 fL (ref 79–97)
Monocytes Absolute: 0.4 10*3/uL (ref 0.1–0.9)
Monocytes: 7 %
Neutrophils Absolute: 3.7 10*3/uL (ref 1.4–7.0)
Neutrophils: 56 %
Platelets: 272 10*3/uL (ref 150–450)
RBC: 4.2 x10E6/uL (ref 3.77–5.28)
RDW: 14.5 % (ref 11.7–15.4)
WBC: 6.6 10*3/uL (ref 3.4–10.8)

## 2020-07-17 ENCOUNTER — Ambulatory Visit (INDEPENDENT_AMBULATORY_CARE_PROVIDER_SITE_OTHER): Payer: Medicare Other

## 2020-07-17 DIAGNOSIS — I5022 Chronic systolic (congestive) heart failure: Secondary | ICD-10-CM | POA: Diagnosis not present

## 2020-07-17 DIAGNOSIS — I428 Other cardiomyopathies: Secondary | ICD-10-CM

## 2020-07-19 LAB — CUP PACEART REMOTE DEVICE CHECK
Battery Remaining Longevity: 30 mo
Battery Remaining Percentage: 38 %
Battery Voltage: 2.9 V
Brady Statistic AP VP Percent: 82 %
Brady Statistic AP VS Percent: 2 %
Brady Statistic AS VP Percent: 8.1 %
Brady Statistic AS VS Percent: 1 %
Brady Statistic RA Percent Paced: 74 %
Date Time Interrogation Session: 20220627121232
HighPow Impedance: 87 Ohm
HighPow Impedance: 87 Ohm
Implantable Lead Implant Date: 20110912
Implantable Lead Implant Date: 20110912
Implantable Lead Implant Date: 20120710
Implantable Lead Location: 753858
Implantable Lead Location: 753859
Implantable Lead Location: 753860
Implantable Lead Model: 7122
Implantable Pulse Generator Implant Date: 20180621
Lead Channel Impedance Value: 380 Ohm
Lead Channel Impedance Value: 510 Ohm
Lead Channel Impedance Value: 700 Ohm
Lead Channel Pacing Threshold Amplitude: 0.5 V
Lead Channel Pacing Threshold Amplitude: 0.75 V
Lead Channel Pacing Threshold Amplitude: 0.875 V
Lead Channel Pacing Threshold Pulse Width: 0.5 ms
Lead Channel Pacing Threshold Pulse Width: 0.5 ms
Lead Channel Pacing Threshold Pulse Width: 0.5 ms
Lead Channel Sensing Intrinsic Amplitude: 12 mV
Lead Channel Sensing Intrinsic Amplitude: 3.7 mV
Lead Channel Setting Pacing Amplitude: 2 V
Lead Channel Setting Pacing Amplitude: 2 V
Lead Channel Setting Pacing Amplitude: 2 V
Lead Channel Setting Pacing Pulse Width: 0.5 ms
Lead Channel Setting Pacing Pulse Width: 0.5 ms
Lead Channel Setting Sensing Sensitivity: 0.5 mV
Pulse Gen Serial Number: 9761374

## 2020-08-04 ENCOUNTER — Telehealth (HOSPITAL_COMMUNITY): Payer: Self-pay | Admitting: Pharmacy Technician

## 2020-08-04 NOTE — Telephone Encounter (Signed)
Advanced Heart Failure Patient Advocate Encounter  Received the provider's portion of a BMS application. It was filled out and daughter Terrence Dupont was contacted to pick up the application, as requested.   Charlann Boxer, CPhT

## 2020-08-07 ENCOUNTER — Ambulatory Visit (INDEPENDENT_AMBULATORY_CARE_PROVIDER_SITE_OTHER): Payer: Medicare Other

## 2020-08-07 DIAGNOSIS — I5022 Chronic systolic (congestive) heart failure: Secondary | ICD-10-CM

## 2020-08-07 DIAGNOSIS — Z95810 Presence of automatic (implantable) cardiac defibrillator: Secondary | ICD-10-CM

## 2020-08-07 NOTE — Progress Notes (Signed)
Remote ICD transmission.   

## 2020-08-09 NOTE — Progress Notes (Signed)
EPIC Encounter for ICM Monitoring  Patient Name: Melissa Paul is a 75 y.o. female Date: 08/09/2020 Primary Care Physican: Sharion Balloon, FNP Primary Cardiologist: [facility] Electrophysiologist: Allred Bi-V Pacing: 90%        08/09/2020 Weight: 172-178 lbs   AT/AF Burden: 3.5% (taking Eliquis)         Spoke with patient and heart failure questions reviewed.  Pt asymptomatic for fluid accumulation and feeing well.    CorVue Thoracic impedance normal but was suggesting possible fluid accumulation from 6/16-6/25 and 7/3-7/8.   Prescribed:  Furosemide 80 mg Take 1 tablet (80 mg total) by mouth daily as needed Spirolactone 25 mg take 1 tablet daily Farxiga 5 mg take 1 tablet daily   Labs: 07/11/2020 Creatinine 1.13, BUN 12, Potassium 4.0, Sodium 139, GFR 50 06/27/2020 Creatinine 1.57, BUN 22, Potassium 4.8, Sodium 134, GFR 34  05/02/2020 Creatinine 1.15, BUN 15, Potassium 4.1, Sodium 138 04/26/2020 Creatinine 1.36, BUN 16, Potassium 4.6, Sodium 136, GFR 40 01/31/2020 Creatinine 0.98, BUN 13, Potassium 4.3, Sodium 138, GFR 56-65 A complete set of results can be found in Results Review.   Recommendations: No changes and encouraged to call if experiencing any fluid symptoms.   Follow-up plan: ICM clinic phone appointment on 09/11/2020.   91 day device clinic remote transmission 10/16/2020.    EP/Cardiology Office Visits: 11/14/2020 with Dr. Domenic Polite.  12/04/2020 with Dr [name]   Copy of ICM check sent to Dr. [name].     3 month ICM trend: 08/07/2020.    1 Year ICM trend:       [name], RN 08/09/2020 9:47 AM

## 2020-08-10 ENCOUNTER — Other Ambulatory Visit: Payer: Self-pay | Admitting: Family

## 2020-08-10 DIAGNOSIS — F411 Generalized anxiety disorder: Secondary | ICD-10-CM

## 2020-08-10 DIAGNOSIS — G4700 Insomnia, unspecified: Secondary | ICD-10-CM

## 2020-08-16 ENCOUNTER — Ambulatory Visit: Payer: Medicare Other | Admitting: Cardiology

## 2020-08-18 ENCOUNTER — Other Ambulatory Visit (HOSPITAL_COMMUNITY): Payer: Self-pay

## 2020-08-18 MED ORDER — APIXABAN 5 MG PO TABS: 5.0000 mg | ORAL_TABLET | Freq: Two times a day (BID) | ORAL | 0 refills | Status: DC

## 2020-08-21 ENCOUNTER — Other Ambulatory Visit (HOSPITAL_COMMUNITY): Payer: Self-pay | Admitting: *Deleted

## 2020-08-21 MED ORDER — APIXABAN 5 MG PO TABS: 5.0000 mg | ORAL_TABLET | Freq: Two times a day (BID) | ORAL | 0 refills | Status: DC

## 2020-08-29 ENCOUNTER — Encounter: Payer: Self-pay | Admitting: Family

## 2020-08-29 ENCOUNTER — Ambulatory Visit (INDEPENDENT_AMBULATORY_CARE_PROVIDER_SITE_OTHER): Payer: Medicare Other | Admitting: Family

## 2020-08-29 ENCOUNTER — Other Ambulatory Visit: Payer: Self-pay

## 2020-08-29 VITALS — BP 97/59 | HR 71 | Temp 97.0°F | Ht 63.0 in | Wt 178.0 lb

## 2020-08-29 DIAGNOSIS — F411 Generalized anxiety disorder: Secondary | ICD-10-CM

## 2020-08-29 DIAGNOSIS — E039 Hypothyroidism, unspecified: Secondary | ICD-10-CM

## 2020-08-29 DIAGNOSIS — I9589 Other hypotension: Secondary | ICD-10-CM

## 2020-08-29 DIAGNOSIS — I480 Paroxysmal atrial fibrillation: Secondary | ICD-10-CM

## 2020-08-29 DIAGNOSIS — I5023 Acute on chronic systolic (congestive) heart failure: Secondary | ICD-10-CM

## 2020-08-29 DIAGNOSIS — N898 Other specified noninflammatory disorders of vagina: Secondary | ICD-10-CM

## 2020-08-29 DIAGNOSIS — E1165 Type 2 diabetes mellitus with hyperglycemia: Secondary | ICD-10-CM | POA: Diagnosis not present

## 2020-08-29 DIAGNOSIS — R42 Dizziness and giddiness: Secondary | ICD-10-CM

## 2020-08-29 LAB — BMP8+EGFR
BUN/Creatinine Ratio: 11 — ABNORMAL LOW (ref 12–28)
BUN: 15 mg/dL (ref 8–27)
CO2: 23 mmol/L (ref 20–29)
Calcium: 9.7 mg/dL (ref 8.7–10.3)
Chloride: 93 mmol/L — ABNORMAL LOW (ref 96–106)
Creatinine, Ser: 1.31 mg/dL — ABNORMAL HIGH (ref 0.57–1.00)
Glucose: 278 mg/dL — ABNORMAL HIGH (ref 65–99)
Potassium: 3.7 mmol/L (ref 3.5–5.2)
Sodium: 136 mmol/L (ref 134–144)
eGFR: 42 mL/min/{1.73_m2} — ABNORMAL LOW (ref >59)

## 2020-08-29 LAB — BAYER DCA HB A1C WAIVED: HB A1C (BAYER DCA - WAIVED): 8.9 % — ABNORMAL HIGH (ref <7.0)

## 2020-08-29 NOTE — Patient Instructions (Signed)
Diabetes Mellitus and Nutrition, Adult When you have diabetes, or diabetes mellitus, it is very important to have healthy eating habits because your blood sugar (glucose) levels are greatly affected by what you eat and drink. Eating healthy foods in the right amounts, at about the same times every day, can help you:  Control your blood glucose.  Lower your risk of heart disease.  Improve your blood pressure.  Reach or maintain a healthy weight. What can affect my meal plan? Every person with diabetes is different, and each person has different needs for a meal plan. Your health care provider may recommend that you work with a dietitian to make a meal plan that is best for you. Your meal plan may vary depending on factors such as:  The calories you need.  The medicines you take.  Your weight.  Your blood glucose, blood pressure, and cholesterol levels.  Your activity level.  Other health conditions you have, such as heart or kidney disease. How do carbohydrates affect me? Carbohydrates, also called carbs, affect your blood glucose level more than any other type of food. Eating carbs naturally raises the amount of glucose in your blood. Carb counting is a method for keeping track of how many carbs you eat. Counting carbs is important to keep your blood glucose at a healthy level, especially if you use insulin or take certain oral diabetes medicines. It is important to know how many carbs you can safely have in each meal. This is different for every person. Your dietitian can help you calculate how many carbs you should have at each meal and for each snack. How does alcohol affect me? Alcohol can cause a sudden decrease in blood glucose (hypoglycemia), especially if you use insulin or take certain oral diabetes medicines. Hypoglycemia can be a life-threatening condition. Symptoms of hypoglycemia, such as sleepiness, dizziness, and confusion, are similar to symptoms of having too much  alcohol.  Do not drink alcohol if: ? Your health care provider tells you not to drink. ? You are pregnant, may be pregnant, or are planning to become pregnant.  If you drink alcohol: ? Do not drink on an empty stomach. ? Limit how much you use to:  0-1 drink a day for women.  0-2 drinks a day for men. ? Be aware of how much alcohol is in your drink. In the [place] one drink equals one 12 oz bottle of beer (355 mL), one 5 oz glass of wine (148 mL), or one 1 oz glass of hard liquor (44 mL). ? Keep yourself hydrated with water, diet soda, or unsweetened iced tea.  Keep in mind that regular soda, juice, and other mixers may contain a lot of sugar and must be counted as carbs. What are tips for following this plan? Reading food labels  Start by checking the serving size on the "Nutrition Facts" label of packaged foods and drinks. The amount of calories, carbs, fats, and other nutrients listed on the label is based on one serving of the item. Many items contain more than one serving per package.  Check the total grams (g) of carbs in one serving. You can calculate the number of servings of carbs in one serving by dividing the total carbs by 15. For example, if a food has 30 g of total carbs per serving, it would be equal to 2 servings of carbs.  Check the number of grams (g) of saturated fats and trans fats in one serving. Choose foods that have   a low amount or none of these fats.  Check the number of milligrams (mg) of salt (sodium) in one serving. Most people should limit total sodium intake to less than 2,300 mg per day.  Always check the nutrition information of foods labeled as "low-fat" or "nonfat." These foods may be higher in added sugar or refined carbs and should be avoided.  Talk to your dietitian to identify your daily goals for nutrients listed on the label. Shopping  Avoid buying canned, pre-made, or processed foods. These foods tend to be high in fat, sodium, and added  sugar.  Shop around the outside edge of the grocery store. This is where you will most often find fresh fruits and vegetables, bulk grains, fresh meats, and fresh dairy. Cooking  Use low-heat cooking methods, such as baking, instead of high-heat cooking methods like deep frying.  Cook using healthy oils, such as olive, canola, or sunflower oil.  Avoid cooking with butter, cream, or high-fat meats. Meal planning  Eat meals and snacks regularly, preferably at the same times every day. Avoid going long periods of time without eating.  Eat foods that are high in fiber, such as fresh fruits, vegetables, beans, and whole grains. Talk with your dietitian about how many servings of carbs you can eat at each meal.  Eat 4-6 oz (112-168 g) of lean protein each day, such as lean meat, chicken, fish, eggs, or tofu. One ounce (oz) of lean protein is equal to: ? 1 oz (28 g) of meat, chicken, or fish. ? 1 egg. ?  cup (62 g) of tofu.  Eat some foods each day that contain healthy fats, such as avocado, nuts, seeds, and fish.   What foods should I eat? Fruits Berries. Apples. Oranges. Peaches. Apricots. Plums. Grapes. Mango. Papaya. Pomegranate. Kiwi. Cherries. Vegetables Lettuce. Spinach. Leafy greens, including kale, chard, collard greens, and mustard greens. Beets. Cauliflower. Cabbage. Broccoli. Carrots. Green beans. Tomatoes. Peppers. Onions. Cucumbers. Brussels sprouts. Grains Whole grains, such as whole-wheat or whole-grain bread, crackers, tortillas, cereal, and pasta. Unsweetened oatmeal. Quinoa. Brown or wild rice. Meats and other proteins Seafood. Poultry without skin. Lean cuts of poultry and beef. Tofu. Nuts. Seeds. Dairy Low-fat or fat-free dairy products such as milk, yogurt, and cheese. The items listed above may not be a complete list of foods and beverages you can eat. Contact a dietitian for more information. What foods should I avoid? Fruits Fruits canned with  syrup. Vegetables Canned vegetables. Frozen vegetables with butter or cream sauce. Grains Refined white flour and flour products such as bread, pasta, snack foods, and cereals. Avoid all processed foods. Meats and other proteins Fatty cuts of meat. Poultry with skin. Breaded or fried meats. Processed meat. Avoid saturated fats. Dairy Full-fat yogurt, cheese, or milk. Beverages Sweetened drinks, such as soda or iced tea. The items listed above may not be a complete list of foods and beverages you should avoid. Contact a dietitian for more information. Questions to ask a health care provider  Do I need to meet with a diabetes educator?  Do I need to meet with a dietitian?  What number can I call if I have questions?  When are the best times to check my blood glucose? Where to find more information:  American Diabetes Association: diabetes.org  Academy of Nutrition and Dietetics: www.eatright.org  National Institute of Diabetes and Digestive and Kidney Diseases: www.niddk.nih.gov  Association of Diabetes Care and Education Specialists: www.diabeteseducator.org Summary  It is important to have healthy eating   habits because your blood sugar (glucose) levels are greatly affected by what you eat and drink.  A healthy meal plan will help you control your blood glucose and maintain a healthy lifestyle.  Your health care provider may recommend that you work with a dietitian to make a meal plan that is best for you.  Keep in mind that carbohydrates (carbs) and alcohol have immediate effects on your blood glucose levels. It is important to count carbs and to use alcohol carefully. This information is not intended to replace advice given to you by your health care provider. Make sure you discuss any questions you have with your health care provider. Document Revised: 12/15/2018 Document Reviewed: 12/15/2018 Elsevier Patient Education  2021 Elsevier Inc.  

## 2020-08-29 NOTE — Progress Notes (Signed)
Subjective:    Patient ID: Melissa Paul, female    DOB: 10/09/1945, 75 y.o.   MRN: [id number]  Chief Complaint  Patient presents with   Diabetes    2 mth follow up    Dizziness   URI   Pt presents to the office today for chronic follow up. She is followed by Cardiologists 3 months for CHF, Cardiomyopathy, A Fib, and ventricular tachyarrhythmia. She has a pacemaker. She takes Eliquis BID.    She has had recurrent UTI's and has seen Urologists.  She is complaining of vagina irration. She has seen gyn in the past and was placed on estrace cream. She reports she is having more irration since then and wants a referral placed to see GYN.  Diabetes She presents for her follow-up diabetic visit. She has type 2 diabetes mellitus. Hypoglycemia symptoms include dizziness and nervousness/anxiousness. Associated symptoms include blurred vision and fatigue. Pertinent negatives for diabetes include no foot paresthesias. Symptoms are stable. Diabetic complications include heart disease. Risk factors for coronary artery disease include dyslipidemia, diabetes mellitus, hypertension, sedentary lifestyle and post-menopausal. She is following a generally healthy diet. Her overall blood glucose range is 110-130 mg/dl. An ACE inhibitor/angiotensin II receptor blocker is contraindicated.  Dizziness This is a chronic problem. The current episode started more than 1 month ago. The problem occurs intermittently. Associated symptoms include fatigue.  URI  Pertinent negatives include no diarrhea.  Congestive Heart Failure Presents for follow-up visit. Associated symptoms include edema ("a little"), fatigue, palpitations and shortness of breath. The symptoms have been stable.  Thyroid Problem Presents for follow-up visit. Symptoms include anxiety, depressed mood, fatigue and palpitations. Patient reports no constipation, diarrhea or dry skin.  Anxiety Presents for follow-up visit. Symptoms include depressed mood,  dizziness, excessive worry, irritability, nervous/anxious behavior, palpitations and shortness of breath. Symptoms occur most days. The severity of symptoms is moderate.       Review of Systems  Constitutional:  Positive for fatigue and irritability.  Eyes:  Positive for blurred vision.  Respiratory:  Positive for shortness of breath.   Cardiovascular:  Positive for palpitations.  Gastrointestinal:  Negative for constipation and diarrhea.  Neurological:  Positive for dizziness.  Psychiatric/Behavioral:  The patient is nervous/anxious.   All other systems reviewed and are negative.     Objective:   Physical Exam Vitals reviewed.  Constitutional:      General: She is not in acute distress.    Appearance: She is well-developed. She is obese.  HENT:     Head: Normocephalic and atraumatic.     Right Ear: Tympanic membrane normal.     Left Ear: Tympanic membrane normal.  Eyes:     Pupils: Pupils are equal, round, and reactive to light.  Neck:     Thyroid: No thyromegaly.  Cardiovascular:     Rate and Rhythm: Normal rate and regular rhythm.     Heart sounds: Normal heart sounds. No murmur heard. Pulmonary:     Effort: Pulmonary effort is normal. No respiratory distress.     Breath sounds: Normal breath sounds. No wheezing.  Abdominal:     General: Bowel sounds are normal. There is no distension.     Palpations: Abdomen is soft.     Tenderness: There is no abdominal tenderness.  Musculoskeletal:        General: No tenderness. Normal range of motion.     Cervical back: Normal range of motion and neck supple.  Skin:    General: Skin is  warm and dry.  Neurological:     Mental Status: She is alert and oriented to person, place, and time.     Cranial Nerves: No cranial nerve deficit.     Motor: Weakness (using rolling walker) present.     Gait: Gait abnormal.     Deep Tendon Reflexes: Reflexes are normal and symmetric.  Psychiatric:        Behavior: Behavior normal.         Thought Content: Thought content normal.        Judgment: Judgment normal.      BP (!) 97/59   Pulse 71   Temp (!) 97 F (36.1 C) (Temporal)   Ht 5' 3" (1.6 m)   Wt 178 lb (80.7 kg)   SpO2 97%   BMI 31.53 kg/m      Assessment & Plan:  Melissa Paul comes in today with chief complaint of Diabetes (2 mth follow up ), Dizziness, and URI   Diagnosis and orders addressed:  1. Chronic hypotension - BMP8+EGFR  2. Paroxysmal atrial fibrillation (HCC) - BMP8+EGFR  3. Acute on chronic systolic ACC/AHA stage C congestive heart failure (HCC) - BMP8+EGFR  4. Type 2 diabetes mellitus with hyperglycemia, without long-term current use of insulin (HCC) - Bayer DCA Hb A1c Waived - BMP8+EGFR  5. Hypothyroidism, unspecified type - BMP8+EGFR  6. GAD (generalized anxiety disorder)  - BMP8+EGFR  7. Dizziness  8. Vaginal irritation - Ambulatory referral to Gynecology  Pt will hold spironolactone for next 5 days to see if dizziness improves. If not resume. Keep Cardiologists follow up.  Labs pending Health Maintenance reviewed Diet and exercise encouraged  Follow up plan: 3 months    Evelina Dun, FNP

## 2020-08-31 ENCOUNTER — Other Ambulatory Visit: Payer: Self-pay | Admitting: Family

## 2020-08-31 MED ORDER — INSULIN GLARGINE (1 UNIT DIAL) 300 UNIT/ML ~~LOC~~ SOPN: 5.0000 [IU] | PEN_INJECTOR | Freq: Every day | SUBCUTANEOUS | 2 refills | Status: DC

## 2020-09-11 ENCOUNTER — Ambulatory Visit (INDEPENDENT_AMBULATORY_CARE_PROVIDER_SITE_OTHER): Payer: Medicare Other

## 2020-09-11 DIAGNOSIS — I5022 Chronic systolic (congestive) heart failure: Secondary | ICD-10-CM | POA: Diagnosis not present

## 2020-09-11 DIAGNOSIS — Z95810 Presence of automatic (implantable) cardiac defibrillator: Secondary | ICD-10-CM

## 2020-09-13 ENCOUNTER — Telehealth: Payer: Self-pay

## 2020-09-13 NOTE — Telephone Encounter (Signed)
Remote ICM transmission received.  Attempted call to patient regarding ICM remote transmission and left detailed message per DPR.  Advised to return call for any fluid symptoms or questions. Next ICM remote transmission scheduled 10/23/2020.

## 2020-09-13 NOTE — Progress Notes (Signed)
EPIC Encounter for ICM Monitoring  Patient Name: Melissa Paul is a 75 y.o. female Date: 09/13/2020 Primary Care Physican: Sharion Balloon, FNP Primary Cardiologist: [facility] Electrophysiologist: Allred Bi-V Pacing: 90%        08/09/2020 Weight: 172-178 lbs   AT/AF Burden: 3.4% (taking Eliquis)         Attempted call to patient and unable to reach.  Left detailed message per DPR regarding transmission. Transmission reviewed.     CorVue Thoracic impedance suggesting normal fluid levels.   Prescribed:  Furosemide 80 mg Take 1 tablet (80 mg total) by mouth daily as needed Spirolactone 25 mg take 1 tablet daily Farxiga 5 mg take 1 tablet daily   Labs: 08/29/2020 Creatinine 1.31, BUN 15, Potassium 3.7, Sodium 136, GFR 42 07/11/2020 Creatinine 1.13, BUN 12, Potassium 4.0, Sodium 139, GFR 50 06/27/2020 Creatinine 1.57, BUN 22, Potassium 4.8, Sodium 134, GFR 34  05/02/2020 Creatinine 1.15, BUN 15, Potassium 4.1, Sodium 138 04/26/2020 Creatinine 1.36, BUN 16, Potassium 4.6, Sodium 136, GFR 40 01/31/2020 Creatinine 0.98, BUN 13, Potassium 4.3, Sodium 138, GFR 56-65 A complete set of results can be found in Results Review.   Recommendations: Left voice mail with ICM number and encouraged to call if experiencing any fluid symptoms.   Follow-up plan: ICM clinic phone appointment on 10/23/2020.   91 day device clinic remote transmission 10/16/2020.    EP/Cardiology Office Visits: 11/14/2020 with Dr. Domenic Polite.  12/04/2020 with Dr [name]   Copy of ICM check sent to Dr. [name].      3 month ICM trend: 09/11/2020.    1 Year ICM trend:       [name], RN 09/13/2020 4:14 PM

## 2020-09-22 ENCOUNTER — Other Ambulatory Visit (HOSPITAL_COMMUNITY)
Admission: RE | Admit: 2020-09-22 | Discharge: 2020-09-22 | Disposition: A | Discharge status: Home / Self Care | Payer: Medicare Other | Source: Ambulatory Visit | Attending: Adult Health | Admitting: Adult Health

## 2020-09-22 ENCOUNTER — Ambulatory Visit (INDEPENDENT_AMBULATORY_CARE_PROVIDER_SITE_OTHER): Payer: Medicare Other | Admitting: Adult Health

## 2020-09-22 ENCOUNTER — Other Ambulatory Visit: Payer: Self-pay | Admitting: Family

## 2020-09-22 ENCOUNTER — Other Ambulatory Visit: Payer: Self-pay

## 2020-09-22 ENCOUNTER — Encounter: Payer: Self-pay | Admitting: Adult Health

## 2020-09-22 ENCOUNTER — Other Ambulatory Visit: Payer: Self-pay | Admitting: Internal Medicine

## 2020-09-22 VITALS — BP 112/65 | HR 69 | Ht 63.0 in | Wt 177.0 lb

## 2020-09-22 DIAGNOSIS — F411 Generalized anxiety disorder: Secondary | ICD-10-CM

## 2020-09-22 DIAGNOSIS — N898 Other specified noninflammatory disorders of vagina: Secondary | ICD-10-CM | POA: Insufficient documentation

## 2020-09-22 DIAGNOSIS — N9089 Other specified noninflammatory disorders of vulva and perineum: Secondary | ICD-10-CM | POA: Insufficient documentation

## 2020-09-22 DIAGNOSIS — G4700 Insomnia, unspecified: Secondary | ICD-10-CM

## 2020-09-22 DIAGNOSIS — N8111 Cystocele, midline: Secondary | ICD-10-CM | POA: Insufficient documentation

## 2020-09-22 NOTE — Progress Notes (Signed)
  Subjective:     Patient ID: Melissa Paul, female   DOB: Dec 02, 1945, 75 y.o.   MRN: [id number]  HPI Melissa Paul is a 75 year old white female,widowed, sp hysterectomy in for vaginal irritation and burning. She was seen at [facility] for recurrent UTI recently. She has been suing monistat and estrace cream with out relief. PCP is Melissa Eaves NP  Review of Systems Vaginal irritation and burning History of recurrent UTI No sex in years Reviewed past medical,surgical, social and family history. Reviewed medications and allergies.     Objective:   Physical Exam BP 112/65 (BP Location: Left Arm, Patient Position: Sitting, Cuff Size: Normal)   Pulse 69   Ht '5\' 3"'$  (1.6 m)   Wt 177 lb (80.3 kg)   BMI 31.35 kg/m  Skin warm and dry.Pelvic: external genitalia, labia red, vagina: red and flat, +cystocele,CV swab obtained,urethra has no lesions or masses noted, cervix and uterus are absent,adnexa: no masses or tenderness noted. Bladder is non tender and no masses felt Painted vulva and vagina with gentian violet.  AA is 0 Fall risk is high Depression screen [facility] 2/9 09/22/2020 08/29/2020 07/06/2020  Decreased Interest 0 0 0  Down, Depressed, Hopeless 1 0 1  PHQ - 2 Score 1 0 1  Altered sleeping 1 - 2  Tired, decreased energy 1 - 2  Change in appetite 1 - 2  Feeling bad or failure about yourself  0 - 0  Trouble concentrating 0 - 0  Moving slowly or fidgety/restless 0 - 0  Suicidal thoughts 0 - 0  PHQ-9 Score 4 - 7  Difficult doing work/chores - - Somewhat difficult  Some recent data might be hidden    GAD 7 : Generalized Anxiety Score 09/22/2020 06/18/2019  Nervous, Anxious, on Edge 1 3  Control/stop worrying 1 3  Worry too much - different things 0 3  Trouble relaxing 0 3  Restless 0 0  Easily annoyed or irritable 0 0  Afraid - awful might happen 1 2  Total GAD 7 Score 3 14  Anxiety Difficulty - Somewhat difficult      Upstream - 09/22/20 1150       Pregnancy Intention Screening    Does the patient want to become pregnant in the next year? N/A    Does the patient's partner want to become pregnant in the next year? N/A    Would the patient like to discuss contraceptive options today? N/A      Contraception Wrap Up   Current Method --   hyst   End Method --   hyst   Contraception Counseling Provided No             Examination chaperoned by Celene Squibb LPN     Assessment:     1. Vaginal irritation CV swab sent for yeast and BV Panted vagina and vulva with gentian violet Pat dry Do not rub No use monistat now Use estrace cream in about 3 days   2. Vulvar irritation Painted with Gentian violet  3. Cystocele, midline     Plan:     Follow up in 1 week

## 2020-09-26 ENCOUNTER — Other Ambulatory Visit: Payer: Self-pay

## 2020-09-26 MED ORDER — CARVEDILOL 6.25 MG PO TABS: 18.7500 mg | ORAL_TABLET | Freq: Two times a day (BID) | ORAL | 2 refills | Status: DC

## 2020-09-26 NOTE — Telephone Encounter (Signed)
Refilled 07/22/20 #90, no refills Last office visit  08/29/20

## 2020-09-27 ENCOUNTER — Telehealth: Payer: Self-pay | Admitting: Adult Health

## 2020-09-27 LAB — CERVICOVAGINAL ANCILLARY ONLY
Bacterial Vaginitis (gardnerella): POSITIVE — AB
Candida Glabrata: NEGATIVE
Candida Vaginitis: NEGATIVE
Comment: NEGATIVE
Comment: NEGATIVE
Comment: NEGATIVE

## 2020-09-27 MED ORDER — METRONIDAZOLE 500 MG PO TABS: 500.0000 mg | ORAL_TABLET | Freq: Two times a day (BID) | ORAL | 0 refills | Status: DC

## 2020-09-27 NOTE — Telephone Encounter (Signed)
Left message that vaginal swab +BV rx sent for flagyl to [facility] in [place]

## 2020-09-28 ENCOUNTER — Encounter: Payer: Self-pay | Admitting: Family

## 2020-09-28 ENCOUNTER — Ambulatory Visit (INDEPENDENT_AMBULATORY_CARE_PROVIDER_SITE_OTHER): Payer: Medicare Other | Admitting: Family

## 2020-09-28 ENCOUNTER — Other Ambulatory Visit: Payer: Self-pay

## 2020-09-28 VITALS — BP 111/73 | HR 72 | Temp 97.2°F | Ht 62.0 in | Wt 179.6 lb

## 2020-09-28 DIAGNOSIS — N9089 Other specified noninflammatory disorders of vulva and perineum: Secondary | ICD-10-CM

## 2020-09-28 DIAGNOSIS — N760 Acute vaginitis: Secondary | ICD-10-CM | POA: Diagnosis not present

## 2020-09-28 DIAGNOSIS — B9689 Other specified bacterial agents as the cause of diseases classified elsewhere: Secondary | ICD-10-CM | POA: Diagnosis not present

## 2020-09-28 DIAGNOSIS — E1165 Type 2 diabetes mellitus with hyperglycemia: Secondary | ICD-10-CM

## 2020-09-28 MED ORDER — INSULIN DEGLUDEC 100 UNIT/ML ~~LOC~~ SOPN: 8.0000 [IU] | PEN_INJECTOR | Freq: Every day | SUBCUTANEOUS | 2 refills | Status: DC

## 2020-09-28 NOTE — Progress Notes (Signed)
Subjective:    Patient ID: Melissa Paul, female    DOB: Mar 28, 1945, 75 y.o.   MRN: [id number]  Chief Complaint  Patient presents with   Medical Management of Chronic Issues   Pt presents to the office today for chronic follow up on uncontrolled DM. She did not start Toujeo because of price. She is taking Farxiga 5 mg and metformin XR 750 mg daily.   She was seen by GYN and diagnosed with BV. Has not started her Metronidazole 500 mg BID yet.    Diabetes She presents for her follow-up diabetic visit. She has type 2 diabetes mellitus. There are no hypoglycemic associated symptoms. Pertinent negatives for diabetes include no blurred vision and no foot paresthesias. Symptoms are stable. Diabetic complications include heart disease, nephropathy and peripheral neuropathy. Risk factors for coronary artery disease include dyslipidemia, diabetes mellitus, hypertension, sedentary lifestyle and post-menopausal. She is following a generally unhealthy diet. Her overall blood glucose range is 130-140 mg/dl.     Review of Systems  Eyes:  Negative for blurred vision.  All other systems reviewed and are negative.     Objective:   Physical Exam Vitals reviewed.  Constitutional:      General: She is not in acute distress.    Appearance: She is well-developed. She is obese.  HENT:     Head: Normocephalic and atraumatic.     Right Ear: Tympanic membrane normal.     Left Ear: Tympanic membrane normal.  Eyes:     Pupils: Pupils are equal, round, and reactive to light.  Neck:     Thyroid: No thyromegaly.  Cardiovascular:     Rate and Rhythm: Normal rate and regular rhythm.     Heart sounds: Normal heart sounds. No murmur heard. Pulmonary:     Effort: Pulmonary effort is normal. No respiratory distress.     Breath sounds: Normal breath sounds. Stridor present. No wheezing.  Abdominal:     General: Bowel sounds are normal. There is no distension.     Palpations: Abdomen is soft.      Tenderness: There is no abdominal tenderness.  Musculoskeletal:        General: No tenderness. Normal range of motion.     Cervical back: Normal range of motion and neck supple.  Skin:    General: Skin is warm and dry.  Neurological:     Mental Status: She is alert and oriented to person, place, and time.     Cranial Nerves: No cranial nerve deficit.     Motor: Weakness (using rolling walker) present.     Gait: Gait abnormal.     Deep Tendon Reflexes: Reflexes are normal and symmetric.  Psychiatric:        Behavior: Behavior normal.        Thought Content: Thought content normal.        Judgment: Judgment normal.     BP 111/73   Pulse 72   Temp (!) 97.2 F (36.2 C) (Temporal)   Ht '5\' 2"'$  (1.575 m)   Wt 179 lb 9.6 oz (81.5 kg)   HC 0" (0 cm)   BMI 32.85 kg/m      Assessment & Plan:  Melissa Paul comes in today with chief complaint of Medical Management of Chronic Issues   Diagnosis and orders addressed:  1. Type 2 diabetes mellitus with hyperglycemia, without long-term current use of insulin ([facility]) Start Tresiba 8 units  Sample given in office, education given how to give Strict  low carb diet RTO in 2 months and keep appt with Almyra Free for medication assistance  - insulin degludec (TRESIBA) 100 UNIT/ML FlexTouch Pen; Inject 8 Units into the skin daily.  Dispense: 6 mL; Refill: 2  2. Vulvar irritation   3. BV (bacterial vaginosis) Start Flagyl      Evelina Dun, FNP

## 2020-09-28 NOTE — Patient Instructions (Signed)
Diabetes Mellitus and Nutrition, Adult When you have diabetes, or diabetes mellitus, it is very important to have healthy eating habits because your blood sugar (glucose) levels are greatly affected by what you eat and drink. Eating healthy foods in the right amounts, at about the same times every day, can help you:  Control your blood glucose.  Lower your risk of heart disease.  Improve your blood pressure.  Reach or maintain a healthy weight. What can affect my meal plan? Every person with diabetes is different, and each person has different needs for a meal plan. Your health care provider may recommend that you work with a dietitian to make a meal plan that is best for you. Your meal plan may vary depending on factors such as:  The calories you need.  The medicines you take.  Your weight.  Your blood glucose, blood pressure, and cholesterol levels.  Your activity level.  Other health conditions you have, such as heart or kidney disease. How do carbohydrates affect me? Carbohydrates, also called carbs, affect your blood glucose level more than any other type of food. Eating carbs naturally raises the amount of glucose in your blood. Carb counting is a method for keeping track of how many carbs you eat. Counting carbs is important to keep your blood glucose at a healthy level, especially if you use insulin or take certain oral diabetes medicines. It is important to know how many carbs you can safely have in each meal. This is different for every person. Your dietitian can help you calculate how many carbs you should have at each meal and for each snack. How does alcohol affect me? Alcohol can cause a sudden decrease in blood glucose (hypoglycemia), especially if you use insulin or take certain oral diabetes medicines. Hypoglycemia can be a life-threatening condition. Symptoms of hypoglycemia, such as sleepiness, dizziness, and confusion, are similar to symptoms of having too much  alcohol.  Do not drink alcohol if: ? Your health care provider tells you not to drink. ? You are pregnant, may be pregnant, or are planning to become pregnant.  If you drink alcohol: ? Do not drink on an empty stomach. ? Limit how much you use to:  0-1 drink a day for women.  0-2 drinks a day for men. ? Be aware of how much alcohol is in your drink. In the [place] one drink equals one 12 oz bottle of beer (355 mL), one 5 oz glass of wine (148 mL), or one 1 oz glass of hard liquor (44 mL). ? Keep yourself hydrated with water, diet soda, or unsweetened iced tea.  Keep in mind that regular soda, juice, and other mixers may contain a lot of sugar and must be counted as carbs. What are tips for following this plan? Reading food labels  Start by checking the serving size on the "Nutrition Facts" label of packaged foods and drinks. The amount of calories, carbs, fats, and other nutrients listed on the label is based on one serving of the item. Many items contain more than one serving per package.  Check the total grams (g) of carbs in one serving. You can calculate the number of servings of carbs in one serving by dividing the total carbs by 15. For example, if a food has 30 g of total carbs per serving, it would be equal to 2 servings of carbs.  Check the number of grams (g) of saturated fats and trans fats in one serving. Choose foods that have   a low amount or none of these fats.  Check the number of milligrams (mg) of salt (sodium) in one serving. Most people should limit total sodium intake to less than 2,300 mg per day.  Always check the nutrition information of foods labeled as "low-fat" or "nonfat." These foods may be higher in added sugar or refined carbs and should be avoided.  Talk to your dietitian to identify your daily goals for nutrients listed on the label. Shopping  Avoid buying canned, pre-made, or processed foods. These foods tend to be high in fat, sodium, and added  sugar.  Shop around the outside edge of the grocery store. This is where you will most often find fresh fruits and vegetables, bulk grains, fresh meats, and fresh dairy. Cooking  Use low-heat cooking methods, such as baking, instead of high-heat cooking methods like deep frying.  Cook using healthy oils, such as olive, canola, or sunflower oil.  Avoid cooking with butter, cream, or high-fat meats. Meal planning  Eat meals and snacks regularly, preferably at the same times every day. Avoid going long periods of time without eating.  Eat foods that are high in fiber, such as fresh fruits, vegetables, beans, and whole grains. Talk with your dietitian about how many servings of carbs you can eat at each meal.  Eat 4-6 oz (112-168 g) of lean protein each day, such as lean meat, chicken, fish, eggs, or tofu. One ounce (oz) of lean protein is equal to: ? 1 oz (28 g) of meat, chicken, or fish. ? 1 egg. ?  cup (62 g) of tofu.  Eat some foods each day that contain healthy fats, such as avocado, nuts, seeds, and fish.   What foods should I eat? Fruits Berries. Apples. Oranges. Peaches. Apricots. Plums. Grapes. Mango. Papaya. Pomegranate. Kiwi. Cherries. Vegetables Lettuce. Spinach. Leafy greens, including kale, chard, collard greens, and mustard greens. Beets. Cauliflower. Cabbage. Broccoli. Carrots. Green beans. Tomatoes. Peppers. Onions. Cucumbers. Brussels sprouts. Grains Whole grains, such as whole-wheat or whole-grain bread, crackers, tortillas, cereal, and pasta. Unsweetened oatmeal. Quinoa. Brown or wild rice. Meats and other proteins Seafood. Poultry without skin. Lean cuts of poultry and beef. Tofu. Nuts. Seeds. Dairy Low-fat or fat-free dairy products such as milk, yogurt, and cheese. The items listed above may not be a complete list of foods and beverages you can eat. Contact a dietitian for more information. What foods should I avoid? Fruits Fruits canned with  syrup. Vegetables Canned vegetables. Frozen vegetables with butter or cream sauce. Grains Refined white flour and flour products such as bread, pasta, snack foods, and cereals. Avoid all processed foods. Meats and other proteins Fatty cuts of meat. Poultry with skin. Breaded or fried meats. Processed meat. Avoid saturated fats. Dairy Full-fat yogurt, cheese, or milk. Beverages Sweetened drinks, such as soda or iced tea. The items listed above may not be a complete list of foods and beverages you should avoid. Contact a dietitian for more information. Questions to ask a health care provider  Do I need to meet with a diabetes educator?  Do I need to meet with a dietitian?  What number can I call if I have questions?  When are the best times to check my blood glucose? Where to find more information:  American Diabetes Association: diabetes.org  Academy of Nutrition and Dietetics: www.eatright.org  National Institute of Diabetes and Digestive and Kidney Diseases: www.niddk.nih.gov  Association of Diabetes Care and Education Specialists: www.diabeteseducator.org Summary  It is important to have healthy eating   habits because your blood sugar (glucose) levels are greatly affected by what you eat and drink.  A healthy meal plan will help you control your blood glucose and maintain a healthy lifestyle.  Your health care provider may recommend that you work with a dietitian to make a meal plan that is best for you.  Keep in mind that carbohydrates (carbs) and alcohol have immediate effects on your blood glucose levels. It is important to count carbs and to use alcohol carefully. This information is not intended to replace advice given to you by your health care provider. Make sure you discuss any questions you have with your health care provider. Document Revised: 12/15/2018 Document Reviewed: 12/15/2018 Elsevier Patient Education  2021 Elsevier Inc.  

## 2020-09-29 ENCOUNTER — Encounter: Payer: Self-pay | Admitting: Adult Health

## 2020-09-29 ENCOUNTER — Encounter: Payer: Self-pay | Admitting: Obstetrics & Gynecology

## 2020-09-29 ENCOUNTER — Ambulatory Visit (INDEPENDENT_AMBULATORY_CARE_PROVIDER_SITE_OTHER): Payer: Medicare Other | Admitting: Adult Health

## 2020-09-29 VITALS — BP 103/53 | HR 70 | Ht 63.0 in | Wt 177.0 lb

## 2020-09-29 DIAGNOSIS — B9689 Other specified bacterial agents as the cause of diseases classified elsewhere: Secondary | ICD-10-CM | POA: Insufficient documentation

## 2020-09-29 DIAGNOSIS — N760 Acute vaginitis: Secondary | ICD-10-CM

## 2020-09-29 DIAGNOSIS — N9089 Other specified noninflammatory disorders of vulva and perineum: Secondary | ICD-10-CM

## 2020-09-29 NOTE — Progress Notes (Signed)
  Subjective:     Patient ID: KATHELYN SADA, female   DOB: [birth year], 75 y.o.   MRN: [id number]  HPI Emilyn is a 75 year old white female,widowed, sp hysterectomy back in follow up on vaginal irritation and burning and feels better, but still burns near clitoris area. She had +BV on CV swab 09/22/20 and just started flagyl today. She was started on insulin yesterday,she says. PCP is [name] NP.   Review of Systems Vaginal irritation and burning better Reviewed past medical,surgical, social and family history. Reviewed medications and allergies.     Objective:   Physical Exam BP (!) 103/53 (BP Location: Left Arm, Patient Position: Sitting, Cuff Size: Normal)   Pulse 70   Ht '5\' 3"'$  (1.6 m)   Wt 177 lb (80.3 kg)   BMI 31.35 kg/m     Skin warm and dry.Pelvic: external genitalia is normal in appearance no lesions, labia or has red, still tender near clitoris area, vagina pink, not red, painted with gentian violet again.  Assessment:     1. Vulvar irritation Painted with gentian violet   2. BV (bacterial vaginosis) Take flagyl    Plan:     Follow up 10/17/20

## 2020-10-10 ENCOUNTER — Ambulatory Visit: Payer: Medicare Other | Admitting: Pharmacist

## 2020-10-12 ENCOUNTER — Other Ambulatory Visit: Payer: Self-pay

## 2020-10-12 ENCOUNTER — Ambulatory Visit (INDEPENDENT_AMBULATORY_CARE_PROVIDER_SITE_OTHER): Payer: Medicare Other | Admitting: Pharmacist

## 2020-10-12 DIAGNOSIS — E1165 Type 2 diabetes mellitus with hyperglycemia: Secondary | ICD-10-CM

## 2020-10-12 MED ORDER — DAPAGLIFLOZIN PROPANEDIOL 5 MG PO TABS: 5.0000 mg | ORAL_TABLET | Freq: Every day | ORAL | 3 refills | Status: DC

## 2020-10-12 NOTE — Progress Notes (Signed)
Chronic Care Management Pharmacy Note  10/12/2020 Name:  Melissa Paul MRN:  [id number] DOB:  July 07, 1945  Summary: T2DM  Recommendations/Changes made from today's visit: Diabetes: Uncontrolled-A1C 8.9%; current treatment: METFORMIN, TRESIBA (HASN'T STARTED YET);  Suggested once weekly trulicity (we are out of samples) Denies personal and family history of Medullary thyroid cancer (MTC) Will apply for lilly cares patient assistance program  Samples of tresiba given in the meantime  Plan to transition to trulicity and metformin once patient assistance complete--does not want to be on insulin Current glucose readings: fasting glucose: <180, post prandial glucose: n/a Denies hypoglycemic/hyperglycemic symptoms Discussed meal planning options and Plate method for healthy eating Avoid sugary drinks and desserts Incorporate balanced protein, non starchy veggies, 1 serving of carbohydrate with each meal Increase water intake Increase physical activity as able Current exercise: n/a Recommended trulicity Assessed patient finances. Application to be submitted to lilly cares patient assistance program  Follow Up Plan: Telephone follow up appointment with care management team member scheduled for: 1 MONTH   Subjective: Melissa Paul is an 75 y.o. year old female who is a primary patient of Sharion Balloon, FNP.  The CCM team was consulted for assistance with disease management and care coordination needs.    Engaged with patient face to face for initial visit in response to provider referral for pharmacy case management and/or care coordination services.   Consent to Services:  The patient was given the following information about Chronic Care Management services today, agreed to services, and gave verbal consent: 1. CCM service includes personalized support from designated clinical staff supervised by the primary care provider, including individualized plan of care and coordination  with other care providers 2. 24/7 contact phone numbers for assistance for urgent and routine care needs. 3. Service will only be billed when office clinical staff spend 20 minutes or more in a month to coordinate care. 4. Only one practitioner may furnish and bill the service in a calendar month. 5.The patient may stop CCM services at any time (effective at the end of the month) by phone call to the office staff. 6. The patient will be responsible for cost sharing (co-pay) of up to 20% of the service fee (after annual deductible is met). Patient agreed to services and consent obtained.  Patient Care Team: Sharion Balloon, FNP as PCP - General (Family Medicine) Thompson Grayer, MD as PCP - Electrophysiology (Cardiology) Satira Sark, MD as PCP - Cardiology (Cardiology) Lavera Guise, [facility] as Pharmacist (Family Medicine)  Objective:  Lab Results  Component Value Date   CREATININE 1.15 (H) 10/16/2020   CREATININE 1.31 (H) 08/29/2020   CREATININE 1.13 (H) 07/11/2020    Lab Results  Component Value Date   HGBA1C 8.9 (H) 08/29/2020   Last diabetic Eye exam: No results found for: HMDIABEYEEXA  Last diabetic Foot exam: No results found for: HMDIABFOOTEX      Component Value Date/Time   CHOL 156 10/19/2018 1133   TRIG 89 10/19/2018 1133   HDL 45 10/19/2018 1133   CHOLHDL 3.5 10/19/2018 1133   CHOLHDL 3.3 04/11/2007 0600   VLDL 10 04/11/2007 0600   LDLCALC 94 10/19/2018 1133    Hepatic Function Latest Ref Rng & Units 06/27/2020 05/02/2020 04/26/2020  Total Protein 6.0 - 8.5 g/dL 7.2 6.5 6.9  Albumin 3.7 - 4.7 g/dL 4.7 4.2 4.5  AST 0 - 40 IU/L '9 11 10  ' ALT 0 - 32 IU/L '10 10 12  ' Alk Phosphatase  44 - 121 IU/L 78 80 69  Total Bilirubin 0.0 - 1.2 mg/dL 0.5 0.3 0.4  Bilirubin, Direct 0.1 - 0.5 mg/dL - - -    Lab Results  Component Value Date/Time   TSH 2.960 04/26/2020 04:56 PM   TSH 8.680 (H) 10/19/2018 11:33 AM    CBC Latest Ref Rng & Units 10/16/2020 07/11/2020 04/26/2020  WBC  4.0 - 10.5 K/uL 7.9 6.6 10.7  Hemoglobin 12.0 - 15.0 g/dL 11.7(L) 11.7 11.9  Hematocrit 36.0 - 46.0 % 37.1 35.4 35.7  Platelets 150 - 400 K/uL 309 272 271    No results found for: VD25OH  Clinical ASCVD: No  The 10-year ASCVD risk score (Arnett DK, et al., 2019) is: 32.3%   Values used to calculate the score:     Age: 1 years     Sex: Female     Is Non-Hispanic African American: No     Diabetic: Yes     Tobacco smoker: No     Systolic Blood Pressure: 867 mmHg     Is BP treated: Yes     HDL Cholesterol: 45 mg/dL     Total Cholesterol: 156 mg/dL    Other: (CHADS2VASc if Afib, PHQ9 if depression, MMRC or CAT for COPD, ACT, DEXA)  Social History   Tobacco Use  Smoking Status Former   Packs/day: 0.30   Years: 20.00   Pack years: 6.00   Types: Cigarettes   Start date: 08/03/1949   Quit date: 01/22/1996   Years since quitting: 24.7  Smokeless Tobacco Never   BP Readings from Last 3 Encounters:  10/17/20 107/62  10/16/20 100/62  09/29/20 (!) 103/53   Pulse Readings from Last 3 Encounters:  10/17/20 72  10/16/20 70  09/29/20 70   Wt Readings from Last 3 Encounters:  10/17/20 175 lb (79.4 kg)  10/16/20 175 lb 12.8 oz (79.7 kg)  09/29/20 177 lb (80.3 kg)    Assessment: Review of patient past medical history, allergies, medications, health status, including review of consultants reports, laboratory and other test data, was performed as part of comprehensive evaluation and provision of chronic care management services.   SDOH:  (Social Determinants of Health) assessments and interventions performed:    CCM Care Plan  Allergies  Allergen Reactions   Buspar [Buspirone] Other (See Comments)    Patient states that she became hyper and had lots of itching.   Lexapro [Escitalopram Oxalate] Other (See Comments)    Patient states that she became hyper and lots of itching   Bactrim Rash   Cephalexin Rash    Says she is able to take cefuroxime   Chlorhexidine Gluconate  Hives and Itching    Hives, itching after using wipes with last procedure   Clindamycin Rash        Contrast Media [Iodinated Diagnostic Agents] Rash   Doxycycline Rash   Latex Rash        Levofloxacin Rash        Nitrofurantoin Monohyd Macro Rash        Penicillins Swelling and Rash    Has patient had a PCN reaction causing immediate rash, facial/tongue/throat swelling, SOB or lightheadedness with hypotension: Yes Has patient had a PCN reaction causing severe rash involving mucus membranes or skin necrosis: Yes Has patient had a PCN reaction that required hospitalization: No Has patient had a PCN reaction occurring within the last 10 years: No Rash/swelled tongue If all of the above answers are "NO", then may proceed with Cephalosporin use.  Medications Reviewed Today     Reviewed by Lavera Guise, [facility] (Pharmacist) on 10/17/20 at 1203  Med List Status: <None>   Medication Order Taking? Sig Documenting Provider Last Dose Status Informant  acetaminophen (TYLENOL) 650 MG CR tablet [id number] No Take 650 mg by mouth every 8 (eight) hours as needed for pain. [provider] Taking Active   apixaban (ELIQUIS) 5 MG TABS tablet [id number] No Take 1 tablet (5 mg total) by mouth 2 (two) times daily. Need to schedule an appointment for further refills [name], MD Taking Active   blood glucose meter kit and supplies [id number] No Dispense based on patient and insurance preference. Use up to four times daily as directed. (FOR ICD-10 E10.9, E11.9). Sharion Balloon, FNP Taking Active   carvedilol (COREG) 12.5 MG tablet [id number] No Take 1 tablet (12.5 mg total) by mouth 2 (two) times daily. [name], MD Taking Active   cetirizine (ZYRTEC) 10 MG tablet [id number] No Take 10 mg by mouth daily. [provider] Taking Active   clobetasol cream (TEMOVATE) 0.05 % [id number]  Use 2 x daily for 2 weeks then 2-3 x weekly Derrek Monaco A, NP  Active    dapagliflozin propanediol (FARXIGA) 10 MG TABS tablet [id number] No Take 10 mg by mouth daily. [provider] Taking Active   furosemide (LASIX) 80 MG tablet [id number] No Take 80 mg by mouth daily. [provider] Taking Active   hydrOXYzine (VISTARIL) 25 MG capsule [id number] No TAKE 1 CAPSULE BY MOUTH THREE TIMES DAILY Hawks, Christy A, FNP Taking Active   insulin degludec (TRESIBA) 100 UNIT/ML FlexTouch Pen [id number] No Inject 8 Units into the skin daily. Sharion Balloon, FNP Taking Active   levothyroxine (EUTHYROX) 88 MCG tablet [id number] No TAKE 1 TABLET BY MOUTH ONCE DAILY BEFORE BREAKFAST *NEED LABWORK* Hawks, Christy A, FNP Taking Active   metFORMIN (GLUCOPHAGE-XR) 750 MG 24 hr tablet [id number] No Take 750 mg by mouth 2 (two) times daily. [provider] Taking Active   pantoprazole (PROTONIX) 40 MG tablet [id number] No Take 40 mg by mouth daily. [provider] Taking Active   spironolactone (ALDACTONE) 25 MG tablet [id number] No Take 1 tablet (25 mg total) by mouth at bedtime. Needs appt for refills Sharion Balloon, FNP Taking Active   Vericiguat (VERQUVO) 2.5 MG TABS [id number] No Take 1 tablet by mouth daily. [name], MD Taking Active             Patient Active Problem List   Diagnosis Date Noted   Lichen sclerosus et atrophicus 10/17/2020   BV (bacterial vaginosis) 09/29/2020   Cystocele, midline 09/22/2020   Vulvar irritation 09/22/2020   Vaginal irritation 09/22/2020   Recurrent UTI 04/26/2020   Osteoporosis 11/16/2019   GAD (generalized anxiety disorder) 10/19/2018   Constipation 11/12/2017   Cyst of right kidney 11/12/2017   Type 2 diabetes mellitus with hyperglycemia, without long-term current use of insulin ([place]) 08/11/2017   Acute on chronic systolic ACC/AHA stage C congestive heart failure ([place]) 06/08/2014   Abnormal myocardial perfusion study    Exertional dyspnea - NYHA III CHF 04/11/2014   Abnormal nuclear stress  test 04/11/2014   Ventricular tachyarrhythmia ([place]) 03/17/2014   Atrial fibrillation ([place]) 10/10/2011   Chronic hypotension 03/14/2011   Automatic implantable cardioverter-defibrillator in situ 09/13/2010   MITRAL REGURGITATION 99/24/2683   Chronic systolic heart failure ([place]) 07/14/2009   Hypothyroidism 11/29/2008   Nonischemic cardiomyopathy ([place]) 11/29/2008   LBBB 11/29/2008  Immunization History  Administered Date(s) Administered   Influenza,inj,Quad PF,6+ Mos 11/12/2017   Influenza-Unspecified 11/11/2008, 01/08/2011   Pneumococcal Polysaccharide-23 07/11/2009    Conditions to be addressed/monitored: DMII  Care Plan : PHARMD MEDICATION MANAGEMENT  Updates made by Lavera Guise, RPH since 10/17/2020 12:00 AM     Problem: DISEASE PROGRESSION PREVENTION      Long-Range Goal: T2DM   This Visit's Progress: Not on track  Priority: High  Note:   Current Barriers:  Unable to independently afford treatment regimen Unable to achieve control of T2DM   Pharmacist Clinical Goal(s):  Over the next 90 days, patient will verbalize ability to afford treatment regimen achieve control of T2DM as evidenced by IMPROVED GLYCEMIC CONTROL  through collaboration with PharmD and provider.    Interventions: 1:1 collaboration with Sharion Balloon, FNP regarding development and update of comprehensive plan of care as evidenced by provider attestation and co-signature Inter-disciplinary care team collaboration (see longitudinal plan of care) Comprehensive medication review performed; medication list updated in electronic medical record  Diabetes: Uncontrolled-A1C 8.9%; current treatment: METFORMIN, TRESIBA (HASN'T STARTED YET);  Suggested once weekly trulicity (we are out of samples) Denies personal and family history of Medullary thyroid cancer (MTC) Will apply for lilly cares patient assistance program  Samples of tresiba given in the meantime  Plan to transition to trulicity and  metformin once patient assistance complete--does not want to be on insulin Current glucose readings: fasting glucose: <180, post prandial glucose: n/a Denies hypoglycemic/hyperglycemic symptoms Discussed meal planning options and Plate method for healthy eating Avoid sugary drinks and desserts Incorporate balanced protein, non starchy veggies, 1 serving of carbohydrate with each meal Increase water intake Increase physical activity as able Current exercise: n/a Recommended trulicity Assessed patient finances. Application to be submitted to lilly cares patient assistance program   Patient Goals/Self-Care Activities Over the next 90 days, patient will:  - take medications as prescribed collaborate with provider on medication access solutions  Follow Up Plan: Telephone follow up appointment with care management team member scheduled for: 1 MONTH      Medication Assistance: Application for [employer]  medication assistance program. in process.  Anticipated assistance start date TBD.  See plan of care for additional detail.  Patient's preferred pharmacy is:  [facility] [address] [place] [address] Phone: [phone] Fax: [place], [place] [place] [address] Phone: [phone] Fax: [place], [address] [place] [address] Phone: [phone] Fax: [phone]  Follow Up:  Patient agrees to Care Plan and Follow-up.  Plan: Telephone follow up appointment with care management team member scheduled for:  1 MONTH  Regina Eck, PharmD, BCPS Clinical Pharmacist, [place]  II Phone [phone]

## 2020-10-16 ENCOUNTER — Ambulatory Visit (INDEPENDENT_AMBULATORY_CARE_PROVIDER_SITE_OTHER): Payer: Medicare Other

## 2020-10-16 ENCOUNTER — Other Ambulatory Visit: Payer: Self-pay

## 2020-10-16 ENCOUNTER — Encounter (HOSPITAL_COMMUNITY): Payer: Self-pay | Admitting: Cardiology

## 2020-10-16 ENCOUNTER — Ambulatory Visit (HOSPITAL_COMMUNITY)
Admission: RE | Admit: 2020-10-16 | Discharge: 2020-10-16 | Disposition: A | Discharge status: Home / Self Care | Payer: Medicare Other | Source: Ambulatory Visit | Attending: Cardiology | Admitting: Cardiology

## 2020-10-16 ENCOUNTER — Other Ambulatory Visit (HOSPITAL_COMMUNITY): Payer: Self-pay | Admitting: Cardiology

## 2020-10-16 ENCOUNTER — Telehealth (HOSPITAL_COMMUNITY): Payer: Self-pay | Admitting: Pharmacy Technician

## 2020-10-16 ENCOUNTER — Encounter: Payer: Self-pay | Admitting: *Deleted

## 2020-10-16 ENCOUNTER — Other Ambulatory Visit (HOSPITAL_COMMUNITY): Payer: Self-pay

## 2020-10-16 VITALS — BP 100/62 | HR 70 | Wt 175.8 lb

## 2020-10-16 DIAGNOSIS — Z8249 Family history of ischemic heart disease and other diseases of the circulatory system: Secondary | ICD-10-CM | POA: Insufficient documentation

## 2020-10-16 DIAGNOSIS — Z87891 Personal history of nicotine dependence: Secondary | ICD-10-CM | POA: Diagnosis not present

## 2020-10-16 DIAGNOSIS — Z006 Encounter for examination for normal comparison and control in clinical research program: Secondary | ICD-10-CM

## 2020-10-16 DIAGNOSIS — I428 Other cardiomyopathies: Secondary | ICD-10-CM | POA: Insufficient documentation

## 2020-10-16 DIAGNOSIS — Z95810 Presence of automatic (implantable) cardiac defibrillator: Secondary | ICD-10-CM | POA: Insufficient documentation

## 2020-10-16 DIAGNOSIS — I480 Paroxysmal atrial fibrillation: Secondary | ICD-10-CM | POA: Insufficient documentation

## 2020-10-16 DIAGNOSIS — I493 Ventricular premature depolarization: Secondary | ICD-10-CM | POA: Diagnosis not present

## 2020-10-16 DIAGNOSIS — Z79890 Hormone replacement therapy (postmenopausal): Secondary | ICD-10-CM | POA: Insufficient documentation

## 2020-10-16 DIAGNOSIS — Z79899 Other long term (current) drug therapy: Secondary | ICD-10-CM | POA: Insufficient documentation

## 2020-10-16 DIAGNOSIS — Z7901 Long term (current) use of anticoagulants: Secondary | ICD-10-CM | POA: Insufficient documentation

## 2020-10-16 DIAGNOSIS — I5022 Chronic systolic (congestive) heart failure: Secondary | ICD-10-CM | POA: Diagnosis present

## 2020-10-16 DIAGNOSIS — Z7984 Long term (current) use of oral hypoglycemic drugs: Secondary | ICD-10-CM | POA: Insufficient documentation

## 2020-10-16 DIAGNOSIS — Z596 Low income: Secondary | ICD-10-CM | POA: Diagnosis not present

## 2020-10-16 DIAGNOSIS — I447 Left bundle-branch block, unspecified: Secondary | ICD-10-CM | POA: Insufficient documentation

## 2020-10-16 LAB — CUP PACEART REMOTE DEVICE CHECK
Battery Remaining Longevity: 28 mo
Battery Remaining Percentage: 36 %
Battery Voltage: 2.89 V
Brady Statistic AP VP Percent: 83 %
Brady Statistic AP VS Percent: 2.1 %
Brady Statistic AS VP Percent: 7 %
Brady Statistic AS VS Percent: 1 %
Brady Statistic RA Percent Paced: 76 %
Date Time Interrogation Session: 20220926020016
HighPow Impedance: 65 Ohm
HighPow Impedance: 65 Ohm
Implantable Lead Implant Date: 20110912
Implantable Lead Implant Date: 20110912
Implantable Lead Implant Date: 20120710
Implantable Lead Location: 753858
Implantable Lead Location: 753859
Implantable Lead Location: 753860
Implantable Lead Model: 7122
Implantable Pulse Generator Implant Date: 20180621
Lead Channel Impedance Value: 340 Ohm
Lead Channel Impedance Value: 480 Ohm
Lead Channel Impedance Value: 560 Ohm
Lead Channel Pacing Threshold Amplitude: 0.5 V
Lead Channel Pacing Threshold Amplitude: 0.75 V
Lead Channel Pacing Threshold Amplitude: 0.75 V
Lead Channel Pacing Threshold Pulse Width: 0.5 ms
Lead Channel Pacing Threshold Pulse Width: 0.5 ms
Lead Channel Pacing Threshold Pulse Width: 0.5 ms
Lead Channel Sensing Intrinsic Amplitude: 11.8 mV
Lead Channel Sensing Intrinsic Amplitude: 2.7 mV
Lead Channel Setting Pacing Amplitude: 2 V
Lead Channel Setting Pacing Amplitude: 2 V
Lead Channel Setting Pacing Amplitude: 2 V
Lead Channel Setting Pacing Pulse Width: 0.5 ms
Lead Channel Setting Pacing Pulse Width: 0.5 ms
Lead Channel Setting Sensing Sensitivity: 0.5 mV
Pulse Gen Serial Number: 9761374

## 2020-10-16 LAB — BASIC METABOLIC PANEL
Anion gap: 11 (ref 5–15)
BUN: 13 mg/dL (ref 8–23)
CO2: 30 mmol/L (ref 22–32)
Calcium: 8.9 mg/dL (ref 8.9–10.3)
Chloride: 97 mmol/L — ABNORMAL LOW (ref 98–111)
Creatinine, Ser: 1.15 mg/dL — ABNORMAL HIGH (ref 0.44–1.00)
GFR, Estimated: 49 mL/min — ABNORMAL LOW (ref >60)
Glucose, Bld: 148 mg/dL — ABNORMAL HIGH (ref 70–99)
Potassium: 3.8 mmol/L (ref 3.5–5.1)
Sodium: 138 mmol/L (ref 135–145)

## 2020-10-16 LAB — CBC
HCT: 37.1 % (ref 36.0–46.0)
Hemoglobin: 11.7 g/dL — ABNORMAL LOW (ref 12.0–15.0)
MCH: 26.6 pg (ref 26.0–34.0)
MCHC: 31.5 g/dL (ref 30.0–36.0)
MCV: 84.3 fL (ref 80.0–100.0)
Platelets: 309 10*3/uL (ref 150–400)
RBC: 4.4 MIL/uL (ref 3.87–5.11)
RDW: 14.4 % (ref 11.5–15.5)
WBC: 7.9 10*3/uL (ref 4.0–10.5)
nRBC: 0 % (ref 0.0–0.2)

## 2020-10-16 LAB — BRAIN NATRIURETIC PEPTIDE: B Natriuretic Peptide: 247.9 pg/mL — ABNORMAL HIGH (ref 0.0–100.0)

## 2020-10-16 LAB — DIGOXIN LEVEL: Digoxin Level: 1.5 ng/mL (ref 0.8–2.0)

## 2020-10-16 MED ORDER — CARVEDILOL 12.5 MG PO TABS: 12.5000 mg | ORAL_TABLET | Freq: Two times a day (BID) | ORAL | 3 refills | Status: DC

## 2020-10-16 MED ORDER — VERQUVO 2.5 MG PO TABS: 1.0000 | ORAL_TABLET | Freq: Every day | ORAL | 11 refills | Status: DC

## 2020-10-16 NOTE — Progress Notes (Signed)
Medication Samples have been provided to the patient.  Drug name: Eliquis       Strength:         Qty: 5 mg   LOT: [id number]  Exp.Date: 08/2022  Dosing instructions: Take 1 tablet Twice daily   The patient has been instructed regarding the correct time, dose, and frequency of taking this medication, including desired effects and most common side effects.   Melissa Paul 12:59 PM 10/16/2020

## 2020-10-16 NOTE — H&P (View-Only) (Signed)
PCP: Sharion Balloon, FNP Cardiology: Dr. Domenic Polite EP: Dr. [name] HF Cardiology: Dr. Aundra Dubin  75 y.o. with history of CHF/nonischemic cardiomyopathy, paroxysmal atrial fibrillation, and VT referred by Dr. [name] for CHF evaluation. Cardiomyopathy was diagnosed years ago, she had an echo in 8/13 with EF 35-40%.  Due to LBBB, she had St Jude BiV ICD placed.  Cath in 2016 showed no significant coronary disease. Most recent echo in 8/20 showed a severely dilated LV with EF 25-30%.    In 1/21, she had an episode of polymorphic VT with syncope, ICD discharged appropriately. This was in the setting of GI illness with low K, amiodarone was not started.   She has had a clinical decline over the last year.  She has struggled recently with dyspnea.  Using rolling walker, has to stop several times walking into the office.  She is short of breath walking around her house.  She does report chest tightness along with the dyspnea when she walks.  Weight has trended up but only mildly.   I set her up for LHC/RHC in 3/21.  This showed no significant CAD and low filling pressures along with preserved cardiac output.  Echo in 7/21 showed EF 35%, moderate LV dilation, normal RV.   She snores and has daytime sleepiness, but has not wanted sleep study and says she would not wear CPAP.   She returns for followup of CHF and dyspnea.  Her weight has trended down.  She take Lasix 80 mg daily.  She has been more short of breath recently, she is dyspneic walking around the house.  This has been worse over the past 4 months. She was short of breath walking into the office today.  She has had frequent episodes of lightheadedness with standing, no falls.  BP on the lower side today. She is off Entresto with low BP. Also limited by bilateral knee pain.   St Jude device interrogation: 90% BiV pacing, 3.4% atrial fibrillation, thoracic impedance stable.   REDS clip 19%  ECG (personally reviewed): A-V sequential pacing  Labs  (2/21): K 3.9, creatinine 1.11 Labs (3/21): K 4.3, creatinine 1.17 Labs (8/22): K 3.7, creatinine 1.3  PMH: 1. Chronic systolic CHF: Nonischemic cardiomyopathy.  St Jude BiV ICD.  - Echo (8/13): EF 35-40%.  - LHC/RHC (3/16): No significant CAD.  Mean RA 4, PA 22/8, mean PCWP 11, CI 3.4.  - Echo (8/20): EF 25-30%, severe LV dilation, normal RV.  - LHC/RHC (3/21): No significant CAD.  Mean RA 1, PA 20/5, mean PCWP 9, CI 3.7.  - Echo (7/21): EF 35%, moderate LV dilation, normal RV.  2. 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  3. Hyperlipidemia 4. Hypothyroidism 5. Atrial fibrillation: Paroxysmal.  She has refused anticoagulation.  6. PVCs: 8.2% noted on last device check, did not tolerate increase in ventricular rate to try to suppress. 7. PFTs (3/21): Minimal obstruction  Social History   Socioeconomic History   Marital status: Widowed    Spouse name: Not on file   Number of children: 2   Years of education: Not on file   Highest education level: Not on file  Occupational History   Not on file  Tobacco Use   Smoking status: Former    Packs/day: 0.30    Years: 20.00    Pack years: 6.00    Types: Cigarettes    Start date: 08/03/1949    Quit date: 01/22/1996    Years since quitting: 24.7   Smokeless tobacco: Never  Vaping Use   Vaping Use: Never used  Substance and Sexual Activity   Alcohol use: No    Alcohol/week: 0.0 standard drinks   Drug use: No   Sexual activity: Not Currently    Birth control/protection: Surgical    Comment: hyst  Other Topics Concern   Not on file  Social History Narrative   Her daughter, Emma lives with her - takes her to all visits and cooks for her.   Social Determinants of Health   Financial Resource Strain: Medium Risk   Difficulty of Paying Living Expenses: Somewhat hard  Food Insecurity: No Food Insecurity   Worried About Running Out of Food in the Last Year: Never true   Ran Out of Food in the Last Year: Never true  Transportation Needs: No  Transportation Needs   Lack of Transportation (Medical): No   Lack of Transportation (Non-Medical): No  Physical Activity: Inactive   Days of Exercise per Week: 0 days   Minutes of Exercise per Session: 0 min  Stress: No Stress Concern Present   Feeling of Stress : Only a little  Social Connections: Moderately Isolated   Frequency of Communication with Friends and Family: Three times a week   Frequency of Social Gatherings with Friends and Family: Twice a week   Attends Religious Services: 1 to 4 times per year   Active Member of Clubs or Organizations: No   Attends Club or Organization Meetings: Never   Marital Status: Widowed  Intimate Partner Violence: Not At Risk   Fear of Current or Ex-Partner: No   Emotionally Abused: No   Physically Abused: No   Sexually Abused: No   Family History  Problem Relation Age of Onset   CVA Mother        multiple   Heart Problems Mother        "weak heart"   Heart disease Maternal Grandmother    Heart disease Maternal Grandfather    Cancer Father        Stomach and eshpogus   Diabetes Neg Hx    Hypertension Neg Hx    Coronary artery disease Neg Hx    ROS: All systems reviewed and negative except as per HPI.   Current Outpatient Medications  Medication Sig Dispense Refill   acetaminophen (TYLENOL) 650 MG CR tablet Take 650 mg by mouth every 8 (eight) hours as needed for pain.     apixaban (ELIQUIS) 5 MG TABS tablet Take 1 tablet (5 mg total) by mouth 2 (two) times daily. Need to schedule an appointment for further refills 180 tablet 0   blood glucose meter kit and supplies Dispense based on patient and insurance preference. Use up to four times daily as directed. (FOR ICD-10 E10.9, E11.9). 1 each 0   cetirizine (ZYRTEC) 10 MG tablet Take 10 mg by mouth daily.     clobetasol cream (TEMOVATE) 0.05 %      dapagliflozin propanediol (FARXIGA) 10 MG TABS tablet Take 10 mg by mouth daily.     digoxin (LANOXIN) 0.125 MG tablet Take 0.0625 mg by  mouth daily.     furosemide (LASIX) 80 MG tablet Take 80 mg by mouth daily.     hydrOXYzine (VISTARIL) 25 MG capsule TAKE 1 CAPSULE BY MOUTH THREE TIMES DAILY 90 capsule 0   insulin degludec (TRESIBA) 100 UNIT/ML FlexTouch Pen Inject 8 Units into the skin daily. 6 mL 2   levothyroxine (EUTHYROX) 88 MCG tablet TAKE 1 TABLET BY MOUTH ONCE DAILY BEFORE BREAKFAST *  NEED LABWORK* 90 tablet 4   metFORMIN (GLUCOPHAGE-XR) 750 MG 24 hr tablet Take 750 mg by mouth 2 (two) times daily.     pantoprazole (PROTONIX) 40 MG tablet Take 40 mg by mouth daily.     spironolactone (ALDACTONE) 25 MG tablet Take 1 tablet (25 mg total) by mouth at bedtime. Needs appt for refills 90 tablet 0   Vericiguat (VERQUVO) 2.5 MG TABS Take 1 tablet by mouth daily. 30 tablet 11   carvedilol (COREG) 12.5 MG tablet Take 1 tablet (12.5 mg total) by mouth 2 (two) times daily. 180 tablet 3   No current facility-administered medications for this encounter.   BP 100/62   Pulse 70   Wt 79.7 kg (175 lb 12.8 oz)   SpO2 98%   BMI 31.14 kg/m  General: NAD Neck: No JVD, no thyromegaly or thyroid nodule.  Lungs: Clear to auscultation bilaterally with normal respiratory effort. CV: Nondisplaced PMI.  Heart regular S1/S2, no S3/S4, no murmur.  No peripheral edema.  No carotid bruit.  Normal pedal pulses.  Abdomen: Soft, nontender, no hepatosplenomegaly, no distention.  Skin: Intact without lesions or rashes.  Neurologic: Alert and oriented x 3.  Psych: Normal affect. Extremities: No clubbing or cyanosis.  HEENT: Normal.   Assessment/Plan: 1. Chronic systolic CHF: Nonischemic cardiomyopathy by cath in 2016 and again in 3/21.  Low EF back to at least 2013.  Echo in 8/20 with EF 25-30%, severe LV dilation.  RHC in 3/21 showed preserved cardiac output and low filling pressures.  Echo in 7/21 showed EF 35%, moderate LV dilation, normal RV.   St Jude BiV ICD. BiV pacing percentage today is lower at 90%, possibly due to increased PVCs.  She is  still significantly symptomatic, NYHA class III.  However, she is not volume overloaded by exam or by [place], REDS clip 19% (low). I cannot definitively explain her exertional symptoms by cardiac findings.  She also had PFTs in 3/21 that showed minimal obstruction. Significant orthostatic-type symptoms.  - I will continue digoxin, check level today.  - Decrease Coreg to 12.5 mg bid with ongoing orthostatic-type symptoms.  - Continue current Lasix 80 mg daily, BMET/BNP today.  - Entresto stopped due to lightheadedness and low BP.  - With ongoing lightheadedness, will have her wear compression stockings during the day.  - Continue spironolactone 25 mg daily.  - I will start her on vericiguat 2.5 mg daily.  This may help with symptoms and will not affect her BP.  - I will screen her for ANTHEM study.  - I will repeat RHC given ongoing significant dyspnea. We discussed risks/benefits and she agrees to the procedure.  - She is due for repeat echo, will arrange.  2. VT/PVCs: She had polymorphic VT in setting of low K/GI illness.  Syncope with appropriate ICD discharge. BiV pacing is lower today at 90%, possibly due to more PVCs.   - I will arrange for 7 day Zio monitor to assess PVC burden.   3. Suspect OSA: She would not consider CPAP.  4. Atrial fibrillation: Paroxysmal.  3.4% atrial fibrillation on device check.   - Continue Eliquis 5 mg bid.   Followup in 3-4 wks.   Melissa Paul 10/16/2020   

## 2020-10-16 NOTE — Progress Notes (Signed)
PCP: Sharion Balloon, FNP Cardiology: Dr. Domenic Polite EP: Dr. [name] HF Cardiology: Dr. Aundra Dubin  75 y.o. with history of CHF/nonischemic cardiomyopathy, paroxysmal atrial fibrillation, and VT referred by Dr. [name] for CHF evaluation. Cardiomyopathy was diagnosed years ago, she had an echo in 8/13 with EF 35-40%.  Due to LBBB, she had St Jude BiV ICD placed.  Cath in 2016 showed no significant coronary disease. Most recent echo in 8/20 showed a severely dilated LV with EF 25-30%.    In 1/21, she had an episode of polymorphic VT with syncope, ICD discharged appropriately. This was in the setting of GI illness with low K, amiodarone was not started.   She has had a clinical decline over the last year.  She has struggled recently with dyspnea.  Using rolling walker, has to stop several times walking into the office.  She is short of breath walking around her house.  She does report chest tightness along with the dyspnea when she walks.  Weight has trended up but only mildly.   I set her up for LHC/RHC in 3/21.  This showed no significant CAD and low filling pressures along with preserved cardiac output.  Echo in 7/21 showed EF 35%, moderate LV dilation, normal RV.   She snores and has daytime sleepiness, but has not wanted sleep study and says she would not wear CPAP.   She returns for followup of CHF and dyspnea.  Her weight has trended down.  She take Lasix 80 mg daily.  She has been more short of breath recently, she is dyspneic walking around the house.  This has been worse over the past 4 months. She was short of breath walking into the office today.  She has had frequent episodes of lightheadedness with standing, no falls.  BP on the lower side today. She is off Entresto with low BP. Also limited by bilateral knee pain.   St Jude device interrogation: 90% BiV pacing, 3.4% atrial fibrillation, thoracic impedance stable.   REDS clip 19%  ECG (personally reviewed): A-V sequential pacing  Labs  (2/21): K 3.9, creatinine 1.11 Labs (3/21): K 4.3, creatinine 1.17 Labs (8/22): K 3.7, creatinine 1.3  PMH: 1. Chronic systolic CHF: Nonischemic cardiomyopathy.  St Jude BiV ICD.  - Echo (8/13): EF 35-40%.  - LHC/RHC (3/16): No significant CAD.  Mean RA 4, PA 22/8, mean PCWP 11, CI 3.4.  - Echo (8/20): EF 25-30%, severe LV dilation, normal RV.  - LHC/RHC (3/21): No significant CAD.  Mean RA 1, PA 20/5, mean PCWP 9, CI 3.7.  - Echo (7/21): EF 35%, moderate LV dilation, normal RV.  2. 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  3. Hyperlipidemia 4. Hypothyroidism 5. Atrial fibrillation: Paroxysmal.  She has refused anticoagulation.  6. PVCs: 8.2% noted on last device check, did not tolerate increase in ventricular rate to try to suppress. 7. PFTs (3/21): Minimal obstruction  Social History   Socioeconomic History   Marital status: Widowed    Spouse name: Not on file   Number of children: 2   Years of education: Not on file   Highest education level: Not on file  Occupational History   Not on file  Tobacco Use   Smoking status: Former    Packs/day: 0.30    Years: 20.00    Pack years: 6.00    Types: Cigarettes    Start date: 08/03/1949    Quit date: 01/22/1996    Years since quitting: 24.7   Smokeless tobacco: Never  Vaping Use   Vaping Use: Never used  Substance and Sexual Activity   Alcohol use: No    Alcohol/week: 0.0 standard drinks   Drug use: No   Sexual activity: Not Currently    Birth control/protection: Surgical    Comment: hyst  Other Topics Concern   Not on file  Social History Narrative   Her daughter, Emma lives with her - takes her to all visits and cooks for her.   Social Determinants of Health   Financial Resource Strain: Medium Risk   Difficulty of Paying Living Expenses: Somewhat hard  Food Insecurity: No Food Insecurity   Worried About Running Out of Food in the Last Year: Never true   Ran Out of Food in the Last Year: Never true  Transportation Needs: No  Transportation Needs   Lack of Transportation (Medical): No   Lack of Transportation (Non-Medical): No  Physical Activity: Inactive   Days of Exercise per Week: 0 days   Minutes of Exercise per Session: 0 min  Stress: No Stress Concern Present   Feeling of Stress : Only a little  Social Connections: Moderately Isolated   Frequency of Communication with Friends and Family: Three times a week   Frequency of Social Gatherings with Friends and Family: Twice a week   Attends Religious Services: 1 to 4 times per year   Active Member of Clubs or Organizations: No   Attends Club or Organization Meetings: Never   Marital Status: Widowed  Intimate Partner Violence: Not At Risk   Fear of Current or Ex-Partner: No   Emotionally Abused: No   Physically Abused: No   Sexually Abused: No   Family History  Problem Relation Age of Onset   CVA Mother        multiple   Heart Problems Mother        "weak heart"   Heart disease Maternal Grandmother    Heart disease Maternal Grandfather    Cancer Father        Stomach and eshpogus   Diabetes Neg Hx    Hypertension Neg Hx    Coronary artery disease Neg Hx    ROS: All systems reviewed and negative except as per HPI.   Current Outpatient Medications  Medication Sig Dispense Refill   acetaminophen (TYLENOL) 650 MG CR tablet Take 650 mg by mouth every 8 (eight) hours as needed for pain.     apixaban (ELIQUIS) 5 MG TABS tablet Take 1 tablet (5 mg total) by mouth 2 (two) times daily. Need to schedule an appointment for further refills 180 tablet 0   blood glucose meter kit and supplies Dispense based on patient and insurance preference. Use up to four times daily as directed. (FOR ICD-10 E10.9, E11.9). 1 each 0   cetirizine (ZYRTEC) 10 MG tablet Take 10 mg by mouth daily.     clobetasol cream (TEMOVATE) 0.05 %      dapagliflozin propanediol (FARXIGA) 10 MG TABS tablet Take 10 mg by mouth daily.     digoxin (LANOXIN) 0.125 MG tablet Take 0.0625 mg by  mouth daily.     furosemide (LASIX) 80 MG tablet Take 80 mg by mouth daily.     hydrOXYzine (VISTARIL) 25 MG capsule TAKE 1 CAPSULE BY MOUTH THREE TIMES DAILY 90 capsule 0   insulin degludec (TRESIBA) 100 UNIT/ML FlexTouch Pen Inject 8 Units into the skin daily. 6 mL 2   levothyroxine (EUTHYROX) 88 MCG tablet TAKE 1 TABLET BY MOUTH ONCE DAILY BEFORE BREAKFAST *  NEED LABWORK* 90 tablet 4   metFORMIN (GLUCOPHAGE-XR) 750 MG 24 hr tablet Take 750 mg by mouth 2 (two) times daily.     pantoprazole (PROTONIX) 40 MG tablet Take 40 mg by mouth daily.     spironolactone (ALDACTONE) 25 MG tablet Take 1 tablet (25 mg total) by mouth at bedtime. Needs appt for refills 90 tablet 0   Vericiguat (VERQUVO) 2.5 MG TABS Take 1 tablet by mouth daily. 30 tablet 11   carvedilol (COREG) 12.5 MG tablet Take 1 tablet (12.5 mg total) by mouth 2 (two) times daily. 180 tablet 3   No current facility-administered medications for this encounter.   BP 100/62   Pulse 70   Wt 79.7 kg (175 lb 12.8 oz)   SpO2 98%   BMI 31.14 kg/m  General: NAD Neck: No JVD, no thyromegaly or thyroid nodule.  Lungs: Clear to auscultation bilaterally with normal respiratory effort. CV: Nondisplaced PMI.  Heart regular S1/S2, no S3/S4, no murmur.  No peripheral edema.  No carotid bruit.  Normal pedal pulses.  Abdomen: Soft, nontender, no hepatosplenomegaly, no distention.  Skin: Intact without lesions or rashes.  Neurologic: Alert and oriented x 3.  Psych: Normal affect. Extremities: No clubbing or cyanosis.  HEENT: Normal.   Assessment/Plan: 1. Chronic systolic CHF: Nonischemic cardiomyopathy by cath in 2016 and again in 3/21.  Low EF back to at least 2013.  Echo in 8/20 with EF 25-30%, severe LV dilation.  RHC in 3/21 showed preserved cardiac output and low filling pressures.  Echo in 7/21 showed EF 35%, moderate LV dilation, normal RV.   St Jude BiV ICD. BiV pacing percentage today is lower at 90%, possibly due to increased PVCs.  She is  still significantly symptomatic, NYHA class III.  However, she is not volume overloaded by exam or by Corvue, REDS clip 19% (low). I cannot definitively explain her exertional symptoms by cardiac findings.  She also had PFTs in 3/21 that showed minimal obstruction. Significant orthostatic-type symptoms.  - I will continue digoxin, check level today.  - Decrease Coreg to 12.5 mg bid with ongoing orthostatic-type symptoms.  - Continue current Lasix 80 mg daily, BMET/BNP today.  - Entresto stopped due to lightheadedness and low BP.  - With ongoing lightheadedness, will have her wear compression stockings during the day.  - Continue spironolactone 25 mg daily.  - I will start her on vericiguat 2.5 mg daily.  This may help with symptoms and will not affect her BP.  - I will screen her for ANTHEM study.  - I will repeat RHC given ongoing significant dyspnea. We discussed risks/benefits and she agrees to the procedure.  - She is due for repeat echo, will arrange.  2. VT/PVCs: She had polymorphic VT in setting of low K/GI illness.  Syncope with appropriate ICD discharge. BiV pacing is lower today at 90%, possibly due to more PVCs.   - I will arrange for 7 day Zio monitor to assess PVC burden.   3. Suspect OSA: She would not consider CPAP.  4. Atrial fibrillation: Paroxysmal.  3.4% atrial fibrillation on device check.   - Continue Eliquis 5 mg bid.   Followup in 3-4 wks.   Melissa Paul 10/16/2020   

## 2020-10-16 NOTE — Telephone Encounter (Signed)
Advanced Heart Failure Patient Advocate Encounter   Patient was approved to receive Eliquis from [employer]  Patient ID:  [id number] Effective dates: 08/21/20 through 01/20/21  I called [employer] to inquire why her assistance ends on 01/20/21 when she was approved for an entire year in 2021 and has had no changes to her insurance. The representative stated she would send a follow up review request, since the patient has no RX insurance. Hope to hear an approval update soon. The first 90 day shipment was delivered as well.   Charlann Boxer, CPhT

## 2020-10-16 NOTE — Patient Instructions (Addendum)
EKG done today.  RedsClip done today.  Labs done today. We will contact you only if your labs are abnormal.  DECREASE Carvedilol to 12.5mg  (1 tablet) by mouth 2 times daily.   START Verquvo 2.5mg  (1 tablet) by mouth daily.   No other medication changes were made. Please continue all current medications as prescribed.  Your provider has recommended that  you wear a Zio Patch for 7 days.  This monitor will record your heart rhythm for our review.  IF you have any symptoms while wearing the monitor please press the button.  If you have any issues with the patch or you notice a red or orange light on it please call the company at [phone].  Once you remove the patch please mail it back to the company as soon as possible so we can get the results.  Your physician has requested that you have an echocardiogram. Echocardiography is a painless test that uses sound waves to create images of your heart. It provides your doctor with information about the size and shape of your heart and how well your heart's chambers and valves are working. This procedure takes approximately one hour. There are no restrictions for this procedure.  Your physician recommends that you schedule a follow-up appointment in: 3-4 weeks with Dr. Aundra Dubin  If you have any questions or concerns before your next appointment please send [place] a message through [facility] or call our office at [phone].    TO LEAVE A MESSAGE FOR THE NURSE SELECT OPTION 2, PLEASE LEAVE A MESSAGE INCLUDING: YOUR NAME DATE OF BIRTH CALL BACK NUMBER REASON FOR CALL**this is important as we prioritize the call backs  YOU WILL RECEIVE A CALL BACK THE SAME DAY AS LONG AS YOU CALL BEFORE 4:00 PM   Do the following things EVERYDAY: Weigh yourself in the morning before breakfast. Write it down and keep it in a log. Take your medicines as prescribed Eat low salt foods--Limit salt (sodium) to 2000 mg per day.  Stay as active as you can everyday Limit  all fluids for the day to less than 2 liters   At the [facility], you and your health needs are our priority. As part of our continuing mission to provide you with exceptional heart care, we have created designated Provider Care Teams. These Care Teams include your primary Cardiologist (physician) and Advanced Practice Providers (APPs- Physician Assistants and Nurse Practitioners) who all work together to provide you with the care you need, when you need it.   You may see any of the following providers on your designated Care Team at your next follow up: Dr Glori Bickers Dr Haynes Kerns, NP Lyda Jester, [place] Audry Riles, PharmD   Please be sure to bring in all your medications bottles to every appointment.    You are scheduled for a Cardiac Catheterization on Friday, October 7 with Dr. Loralie Champagne.  1. Please arrive at the [facility] (Main Entrance A) at [facility]: [address] [place], [place] [address] at 8:30 AM (This time is two hours before your procedure to ensure your preparation). Free valet parking service is available.   Special note: Every effort is made to have your procedure done on time. Please understand that emergencies sometimes delay scheduled procedures.  2. Diet: Do not eat solid foods after midnight.  The patient may have clear liquids until 5am upon the day of the procedure.  4. Medication instructions in preparation for your  procedure:  Take only 4 units of insulin the night before your procedure. Do not take any insulin on the day of the procedure.  Do not take Diabetes Med Glucophage (Metformin) on the day of the procedure and HOLD 48 HOURS AFTER THE PROCEDURE.  DO NOT TAKE your Eliquis the day before and the day of ypur procedure  DO NOT TAKE Lasix the day of your procedure.  On the morning of your procedure, take any morning medicines NOT listed above.  You may use sips of water.  5. Plan for one night  stay--bring personal belongings. 6. Bring a current list of your medications and current insurance cards. 7. You MUST have a responsible person to drive you home. 8. Someone MUST be with you the first 24 hours after you arrive home or your discharge will be delayed. 9. Please wear clothes that are easy to get on and off and wear slip-on shoes.  Thank you for allowing [place] to care for you!   -- [facility]

## 2020-10-16 NOTE — Progress Notes (Signed)
ReDS Vest / Clip - 10/16/20 1200       ReDS Vest / Clip   Station Marker A    Ruler Value 28.5    ReDS Value Range Low volume    ReDS Actual Value 19

## 2020-10-16 NOTE — Telephone Encounter (Signed)
Advanced Heart Failure Patient Advocate Encounter  Patient was seen in clinic today and started on Verquvo 2.5mg .   The patient is currently uninsured (has part A&B).   Started an [employer].   Will fax in once signatures are obtained.

## 2020-10-17 ENCOUNTER — Ambulatory Visit (INDEPENDENT_AMBULATORY_CARE_PROVIDER_SITE_OTHER): Payer: Medicare Other | Admitting: Adult Health

## 2020-10-17 ENCOUNTER — Encounter: Payer: Self-pay | Admitting: Adult Health

## 2020-10-17 VITALS — BP 107/62 | HR 72 | Ht 64.0 in | Wt 175.0 lb

## 2020-10-17 DIAGNOSIS — N9089 Other specified noninflammatory disorders of vulva and perineum: Secondary | ICD-10-CM

## 2020-10-17 DIAGNOSIS — L900 Lichen sclerosus et atrophicus: Secondary | ICD-10-CM

## 2020-10-17 MED ORDER — CLOBETASOL PROPIONATE 0.05 % EX CREA: TOPICAL_CREAM | CUTANEOUS | 3 refills | Status: DC

## 2020-10-17 NOTE — Patient Instructions (Signed)
Visit Information  PATIENT GOALS:  Goals Addressed               This Visit's Progress     Patient Stated     T2DM PHARMD GOAL (pt-stated)        Current Barriers:  Unable to independently afford treatment regimen Unable to achieve control of T2DM   Pharmacist Clinical Goal(s):  Over the next 90 days, patient will verbalize ability to afford treatment regimen achieve control of T2DM as evidenced by IMPROVED GLYCEMIC CONTROL through collaboration with PharmD and provider.    Interventions: 1:1 collaboration with Sharion Balloon, FNP regarding development and update of comprehensive plan of care as evidenced by provider attestation and co-signature Inter-disciplinary care team collaboration (see longitudinal plan of care) Comprehensive medication review performed; medication list updated in electronic medical record  Diabetes: Uncontrolled-A1C 8.9%; current treatment: METFORMIN, TRESIBA 8 UNITS(HASN'T STARTED YET); D/C'D FARXIGA DUE TO RECURRENT YEAST INFECTIONS Suggested once weekly trulicity (we are out of samples) Denies personal and family history of Medullary thyroid cancer (MTC) Will apply for [employer] patient assistance program  Samples of tresiba given in the meantime  Plan to transition to trulicity and metformin once patient assistance complete--does not want to be on insulin Current glucose readings: fasting glucose: <180, post prandial glucose: n/a Denies hypoglycemic/hyperglycemic symptoms Discussed meal planning options and Plate method for healthy eating Avoid sugary drinks and desserts Incorporate balanced protein, non starchy veggies, 1 serving of carbohydrate with each meal Increase water intake Increase physical activity as able Current exercise: n/a Recommended trulicity Assessed patient finances. Application to be submitted to [employer] patient assistance program   Patient Goals/Self-Care Activities Over the next 90 days, patient will:  - take  medications as prescribed collaborate with provider on medication access solutions  Follow Up Plan: Telephone follow up appointment with care management team member scheduled for: 1 MONTH         The patient verbalized understanding of instructions, educational materials, and care plan provided today and declined offer to receive copy of patient instructions, educational materials, and care plan.   Telephone follow up appointment with care management team member scheduled for:1 MONTH  Regina Eck, PharmD, BCPS Clinical Pharmacist, [place]  [facility] Phone [phone]

## 2020-10-17 NOTE — Progress Notes (Signed)
  Subjective:     Patient ID: Melissa Paul, female   DOB: 1946/01/21, 75 y.o.   MRN: [id number]  HPI Melissa Paul is a 75 year old white female,widowed, sp hysterectomy,back in follow up on taking flagyl for BV and having been painted with gentian violet and feels better but still sore near clitoris area. She says felt better when on flagyl. PCP is [name] NP  Review of Systems Feels better still tender/sore near clitoris area Reviewed past medical,surgical, social and family history. Reviewed medications and allergies.     Objective:   Physical Exam BP 107/62 (BP Location: Left Arm, Patient Position: Sitting, Cuff Size: Normal)   Pulse 72   Ht 5\' 4"  (1.626 m)   Wt 175 lb (79.4 kg)   BMI 30.04 kg/m     Skin warm and dry.Pelvic: external genitalia is normal in appearance,has thin tear at top of clitoral area, skin thin,vagina is pale,,urethra has no lesions or masses noted, cervix and uterus are absent,adnexa: no masses or tenderness noted. Bladder is non tender and no masses felt.  Fall risk is high, uses walker  [place] - 10/17/20 1113       Pregnancy Intention Screening   Does the patient want to become pregnant in the next year? N/A    Does the patient's partner want to become pregnant in the next year? N/A    Would the patient like to discuss contraceptive options today? N/A      Contraception Wrap Up   Current Method Female Sterilization   hyst   End Method Female Sterilization   hyst   Contraception Counseling Provided No            Examination chaperoned by Levy Pupa LPN Assessment:     1. Vulvar irritation  2. Lichen sclerosus et atrophicus Will try temovate Meds ordered this encounter  Medications   clobetasol cream (TEMOVATE) 0.05 %    Sig: Use 2 x daily for 2 weeks then 2-3 x weekly    Dispense:  45 g    Refill:  3    Order Specific Question:   Supervising Provider    Answer:   Florian Buff [[id number]]       Plan:     Will recheck in 4 weeks

## 2020-10-19 NOTE — Research (Signed)
10/16/2020 Spoke with patient about anthem and gave her information about the study. Told her I or Amy would call her later this week and let her know if her labs qualified her for study.  10/19/2020 Spoke with patietn about phone to let her know that she didn't meet for the anthem study, and asked if she would be interested in the barostim device she said no.  While on phone she said that the [facility] monitor fell off of her and broke her skin out. She called the company and told them they said send it back.  I have let Dr Aundra Dubin and his nurse Nira Conn know.  I thanked her for inquiring about the study.

## 2020-10-20 DIAGNOSIS — E1165 Type 2 diabetes mellitus with hyperglycemia: Secondary | ICD-10-CM | POA: Diagnosis not present

## 2020-10-21 ENCOUNTER — Other Ambulatory Visit (HOSPITAL_COMMUNITY): Payer: Self-pay | Admitting: Cardiology

## 2020-10-23 ENCOUNTER — Ambulatory Visit (INDEPENDENT_AMBULATORY_CARE_PROVIDER_SITE_OTHER): Payer: Medicare Other

## 2020-10-23 DIAGNOSIS — I5022 Chronic systolic (congestive) heart failure: Secondary | ICD-10-CM | POA: Diagnosis not present

## 2020-10-23 DIAGNOSIS — Z95810 Presence of automatic (implantable) cardiac defibrillator: Secondary | ICD-10-CM | POA: Diagnosis not present

## 2020-10-23 NOTE — Progress Notes (Signed)
Remote ICD transmission.   

## 2020-10-25 ENCOUNTER — Telehealth: Payer: Self-pay

## 2020-10-25 NOTE — Progress Notes (Signed)
EPIC Encounter for ICM Monitoring  Patient Name: Melissa Paul is a 75 y.o. female Date: 10/25/2020 Primary Care Physican: Sharion Balloon, FNP Primary Cardiologist: [place] Electrophysiologist: [place] Bi-V Pacing: 90%        10/16/2020 Weight: 175 lbs   AT/AF Burden: 3.3% (taking Eliquis)         Attempted call to patient and unable to reach.  Left detailed message per DPR regarding transmission. Transmission reviewed.     CorVue Thoracic impedance suggesting normal fluid levels.   Prescribed:  Furosemide 80 mg Take 80 mg by mouth daily. Take and additional 80 mg if needed. Spirolactone 25 mg take 1 tablet at bedtime Farxiga 5 mg take 1 tablet daily   Labs: 10/17/2020 Creatinine 1.36, BUN 31, Potassium 5.1, Sodium 143, GFR 53 09/19/2020 Creatinine 1.19, BUN 27, Potassium 5.1, Sodium 139, GFR 62 08/29/2020 Creatinine 1.31, BUN 15, Potassium 3.7, Sodium 136, GFR 42 07/11/2020 Creatinine 1.13, BUN 12, Potassium 4.0, Sodium 139, GFR 50 06/27/2020 Creatinine 1.57, BUN 22, Potassium 4.8, Sodium 134, GFR 34  A complete set of results can be found in Results Review.   Recommendations:   Left voice mail with ICM number and encouraged to call if experiencing any fluid symptoms.   Follow-up plan: ICM clinic phone appointment on 11/27/2020.   91 day device clinic remote transmission 01/16/2021.    EP/Cardiology Office Visits: 11/14/2020 with Dr. Domenic Polite.  12/04/2020 with Dr [name]   Copy of ICM check sent to Dr. [name].      3 month ICM trend: 10/23/2020.    1 Year ICM trend:       [name], RN 10/25/2020 9:31 AM

## 2020-10-25 NOTE — Telephone Encounter (Signed)
Remote ICM transmission received.  Attempted call to patient regarding ICM remote transmission and left detailed message per DPR.  Advised to return call for any fluid symptoms or questions. Next ICM remote transmission scheduled 11/27/2020.

## 2020-10-27 ENCOUNTER — Ambulatory Visit (HOSPITAL_COMMUNITY)
Admission: RE | Admit: 2020-10-27 | Discharge: 2020-10-27 | Disposition: A | Discharge status: Home / Self Care | Payer: Medicare Other | Source: Ambulatory Visit | Attending: Cardiology | Admitting: Cardiology

## 2020-10-27 ENCOUNTER — Encounter (HOSPITAL_COMMUNITY): Payer: Self-pay | Admitting: Cardiology

## 2020-10-27 ENCOUNTER — Encounter (HOSPITAL_COMMUNITY)
Admission: RE | Disposition: A | Discharge status: Home / Self Care | Payer: Self-pay | Source: Ambulatory Visit | Attending: Cardiology

## 2020-10-27 ENCOUNTER — Other Ambulatory Visit (HOSPITAL_COMMUNITY): Payer: Self-pay

## 2020-10-27 ENCOUNTER — Other Ambulatory Visit: Payer: Self-pay

## 2020-10-27 DIAGNOSIS — Z95810 Presence of automatic (implantable) cardiac defibrillator: Secondary | ICD-10-CM | POA: Insufficient documentation

## 2020-10-27 DIAGNOSIS — Z794 Long term (current) use of insulin: Secondary | ICD-10-CM | POA: Insufficient documentation

## 2020-10-27 DIAGNOSIS — Z79899 Other long term (current) drug therapy: Secondary | ICD-10-CM | POA: Diagnosis not present

## 2020-10-27 DIAGNOSIS — I493 Ventricular premature depolarization: Secondary | ICD-10-CM | POA: Insufficient documentation

## 2020-10-27 DIAGNOSIS — I480 Paroxysmal atrial fibrillation: Secondary | ICD-10-CM | POA: Diagnosis not present

## 2020-10-27 DIAGNOSIS — Z7984 Long term (current) use of oral hypoglycemic drugs: Secondary | ICD-10-CM | POA: Insufficient documentation

## 2020-10-27 DIAGNOSIS — Z79890 Hormone replacement therapy (postmenopausal): Secondary | ICD-10-CM | POA: Insufficient documentation

## 2020-10-27 DIAGNOSIS — Z87891 Personal history of nicotine dependence: Secondary | ICD-10-CM | POA: Insufficient documentation

## 2020-10-27 DIAGNOSIS — I5022 Chronic systolic (congestive) heart failure: Secondary | ICD-10-CM | POA: Insufficient documentation

## 2020-10-27 DIAGNOSIS — I428 Other cardiomyopathies: Secondary | ICD-10-CM | POA: Insufficient documentation

## 2020-10-27 DIAGNOSIS — I509 Heart failure, unspecified: Secondary | ICD-10-CM | POA: Diagnosis not present

## 2020-10-27 DIAGNOSIS — Z7901 Long term (current) use of anticoagulants: Secondary | ICD-10-CM | POA: Insufficient documentation

## 2020-10-27 DIAGNOSIS — Z8249 Family history of ischemic heart disease and other diseases of the circulatory system: Secondary | ICD-10-CM | POA: Diagnosis not present

## 2020-10-27 HISTORY — PX: RIGHT HEART CATH: CATH118263

## 2020-10-27 LAB — POCT I-STAT EG7
Acid-Base Excess: 5 mmol/L — ABNORMAL HIGH (ref 0.0–2.0)
Acid-Base Excess: 5 mmol/L — ABNORMAL HIGH (ref 0.0–2.0)
Bicarbonate: 30.1 mmol/L — ABNORMAL HIGH (ref 20.0–28.0)
Bicarbonate: 30.4 mmol/L — ABNORMAL HIGH (ref 20.0–28.0)
Calcium, Ion: 1.12 mmol/L — ABNORMAL LOW (ref 1.15–1.40)
Calcium, Ion: 1.15 mmol/L (ref 1.15–1.40)
HCT: 32 % — ABNORMAL LOW (ref 36.0–46.0)
HCT: 33 % — ABNORMAL LOW (ref 36.0–46.0)
Hemoglobin: 10.9 g/dL — ABNORMAL LOW (ref 12.0–15.0)
Hemoglobin: 11.2 g/dL — ABNORMAL LOW (ref 12.0–15.0)
O2 Saturation: 74 %
O2 Saturation: 75 %
Potassium: 3.2 mmol/L — ABNORMAL LOW (ref 3.5–5.1)
Potassium: 3.3 mmol/L — ABNORMAL LOW (ref 3.5–5.1)
Sodium: 139 mmol/L (ref 135–145)
Sodium: 141 mmol/L (ref 135–145)
TCO2: 31 mmol/L (ref 22–32)
TCO2: 32 mmol/L (ref 22–32)
pCO2, Ven: 44.9 mmHg (ref 44.0–60.0)
pCO2, Ven: 45 mmHg (ref 44.0–60.0)
pH, Ven: 7.433 — ABNORMAL HIGH (ref 7.250–7.430)
pH, Ven: 7.439 — ABNORMAL HIGH (ref 7.250–7.430)
pO2, Ven: 38 mmHg (ref 32.0–45.0)
pO2, Ven: 39 mmHg (ref 32.0–45.0)

## 2020-10-27 LAB — GLUCOSE, CAPILLARY: Glucose-Capillary: 143 mg/dL — ABNORMAL HIGH (ref 70–99)

## 2020-10-27 SURGERY — RIGHT HEART CATH
Anesthesia: LOCAL

## 2020-10-27 MED ORDER — SODIUM CHLORIDE 0.9 % WEIGHT BASED INFUSION: 3.0000 mL/kg/h | INTRAVENOUS | Status: DC

## 2020-10-27 MED ORDER — LIDOCAINE HCL (PF) 1 % IJ SOLN
INTRAMUSCULAR | Status: AC
  Filled 2020-10-27: qty 30

## 2020-10-27 MED ORDER — SODIUM CHLORIDE 0.9 % IV SOLN: 250.0000 mL | INTRAVENOUS | Status: DC | PRN

## 2020-10-27 MED ORDER — ONDANSETRON HCL 4 MG/2ML IJ SOLN
4.0000 mg | Freq: Four times a day (QID) | INTRAMUSCULAR | Status: DC | PRN
  Administered 2020-10-27: 4 mg via INTRAVENOUS
  Filled 2020-10-27: qty 2

## 2020-10-27 MED ORDER — HEPARIN (PORCINE) IN NACL 1000-0.9 UT/500ML-% IV SOLN
INTRAVENOUS | Status: AC
  Filled 2020-10-27: qty 500

## 2020-10-27 MED ORDER — SODIUM CHLORIDE 0.9% FLUSH: 3.0000 mL | Freq: Two times a day (BID) | INTRAVENOUS | Status: DC

## 2020-10-27 MED ORDER — LABETALOL HCL 5 MG/ML IV SOLN: 10.0000 mg | INTRAVENOUS | Status: DC | PRN

## 2020-10-27 MED ORDER — SODIUM CHLORIDE 0.9 % WEIGHT BASED INFUSION: 1.0000 mL/kg/h | INTRAVENOUS | Status: DC

## 2020-10-27 MED ORDER — MIDAZOLAM HCL 2 MG/2ML IJ SOLN
INTRAMUSCULAR | Status: AC
  Filled 2020-10-27: qty 2

## 2020-10-27 MED ORDER — LIDOCAINE HCL (PF) 1 % IJ SOLN
INTRAMUSCULAR | Status: DC | PRN
  Administered 2020-10-27: 2 mL

## 2020-10-27 MED ORDER — VERQUVO 5 MG PO TABS
5.0000 mg | ORAL_TABLET | Freq: Every day | ORAL | 3 refills | Status: DC
Start: 2020-10-27 — End: 2020-11-02

## 2020-10-27 MED ORDER — HEPARIN (PORCINE) IN NACL 1000-0.9 UT/500ML-% IV SOLN
INTRAVENOUS | Status: DC | PRN
  Administered 2020-10-27: 500 mL

## 2020-10-27 MED ORDER — ACETAMINOPHEN 325 MG PO TABS: 650.0000 mg | ORAL_TABLET | ORAL | Status: DC | PRN

## 2020-10-27 MED ORDER — SODIUM CHLORIDE 0.9% FLUSH: 3.0000 mL | INTRAVENOUS | Status: DC | PRN

## 2020-10-27 MED ORDER — SODIUM CHLORIDE 0.9 % IV SOLN: INTRAVENOUS | Status: DC

## 2020-10-27 MED ORDER — HYDRALAZINE HCL 20 MG/ML IJ SOLN: 10.0000 mg | INTRAMUSCULAR | Status: DC | PRN

## 2020-10-27 MED ORDER — MIDAZOLAM HCL 2 MG/2ML IJ SOLN
INTRAMUSCULAR | Status: DC | PRN
  Administered 2020-10-27: .5 mg via INTRAVENOUS

## 2020-10-27 SURGICAL SUPPLY — 8 items
CATH BALLN WEDGE 5F 110CM (CATHETERS) ×2 IMPLANT
PACK CARDIAC CATHETERIZATION (CUSTOM PROCEDURE TRAY) ×2 IMPLANT
PROTECTION STATION PRESSURIZED (MISCELLANEOUS) ×2
SHEATH GLIDE SLENDER 4/5FR (SHEATH) ×2 IMPLANT
STATION PROTECTION PRESSURIZED (MISCELLANEOUS) ×1 IMPLANT
TRANSDUCER W/STOPCOCK (MISCELLANEOUS) ×2 IMPLANT
TUBING ART PRESS 72  MALE/FEM (TUBING) ×2
TUBING ART PRESS 72 MALE/FEM (TUBING) ×1 IMPLANT

## 2020-10-27 NOTE — Telephone Encounter (Signed)
Called [employer] to check the status of the patient's application. Representative stated that the approval end date of 01/20/2021 is because the patient has Medicare and they only give approval for those patients until the end of the calendar year.

## 2020-10-27 NOTE — Interval H&P Note (Signed)
History and Physical Interval Note:  10/27/2020 10:10 AM  Melissa Paul  has presented today for surgery, with the diagnosis of CHF.  The various methods of treatment have been discussed with the patient and family. After consideration of risks, benefits and other options for treatment, the patient has consented to  Procedure(s): RIGHT HEART CATH (N/A) as a surgical intervention.  The patient's history has been reviewed, patient examined, no change in status, stable for surgery.  I have reviewed the patient's chart and labs.  Questions were answered to the patient's satisfaction.     Sid Greener [employer]

## 2020-10-27 NOTE — Telephone Encounter (Signed)
Advanced Heart Failure Patient Advocate Encounter  Sent in application via fax 75/64. Patient is changing to 5mg , will confirm with MERCK that 5mg  RX is received.   Will follow up.

## 2020-10-31 ENCOUNTER — Ambulatory Visit (INDEPENDENT_AMBULATORY_CARE_PROVIDER_SITE_OTHER): Payer: Medicare Other | Admitting: Family

## 2020-10-31 ENCOUNTER — Ambulatory Visit (INDEPENDENT_AMBULATORY_CARE_PROVIDER_SITE_OTHER): Payer: Medicare Other

## 2020-10-31 ENCOUNTER — Encounter: Payer: Self-pay | Admitting: Family

## 2020-10-31 ENCOUNTER — Other Ambulatory Visit: Payer: Self-pay

## 2020-10-31 VITALS — BP 105/61 | HR 67 | Temp 98.2°F | Resp 20 | Ht 62.0 in | Wt 177.0 lb

## 2020-10-31 DIAGNOSIS — E1165 Type 2 diabetes mellitus with hyperglycemia: Secondary | ICD-10-CM

## 2020-10-31 DIAGNOSIS — Y92009 Unspecified place in unspecified non-institutional (private) residence as the place of occurrence of the external cause: Secondary | ICD-10-CM

## 2020-10-31 DIAGNOSIS — W19XXXA Unspecified fall, initial encounter: Secondary | ICD-10-CM | POA: Diagnosis not present

## 2020-10-31 DIAGNOSIS — Z9889 Other specified postprocedural states: Secondary | ICD-10-CM

## 2020-10-31 DIAGNOSIS — M25531 Pain in right wrist: Secondary | ICD-10-CM | POA: Diagnosis not present

## 2020-10-31 DIAGNOSIS — B370 Candidal stomatitis: Secondary | ICD-10-CM

## 2020-10-31 LAB — BAYER DCA HB A1C WAIVED: HB A1C (BAYER DCA - WAIVED): 8.2 % — ABNORMAL HIGH (ref 4.8–5.6)

## 2020-10-31 MED ORDER — NYSTATIN 100000 UNIT/ML MT SUSP: 5.0000 mL | Freq: Four times a day (QID) | OROMUCOSAL | 0 refills | Status: DC

## 2020-10-31 NOTE — Progress Notes (Signed)
Subjective:    Patient ID: Melissa Paul, female    DOB: [birth year], 75 y.o.   MRN: [id number]  Chief Complaint  Patient presents with   Medical Management of Chronic Issues   Pt presents to the office today to follow up on DM. She was seen on 08/29/20 and her A1C was 8.9. She was seen on 09/28/20 and started on Tresiba 8 units. She states she is doing well.   She had a right heart cath on 10/27/20. She reports she is doing well.   She is also complaining of right wrist pain. She reports she fell two weeks ago and landed on that wrist. She reports her pain is 7 out 10 and can not lift things.   She is also complaining of a white coating on her her tongue. She reports her mouth is dry.  Diabetes She presents for her follow-up diabetic visit. She has type 2 diabetes mellitus. There are no hypoglycemic associated symptoms. Associated symptoms include foot paresthesias. Pertinent negatives for diabetes include no blurred vision. Symptoms are stable. Diabetic complications include heart disease and peripheral neuropathy. Risk factors for coronary artery disease include dyslipidemia, diabetes mellitus, hypertension, post-menopausal and sedentary lifestyle. She is following a generally unhealthy diet. Her overall blood glucose range is 130-140 mg/dl. An ACE inhibitor/angiotensin II receptor blocker is being taken.     Review of Systems  Eyes:  Negative for blurred vision.  All other systems reviewed and are negative.     Objective:   Physical Exam Vitals reviewed.  Constitutional:      General: She is not in acute distress.    Appearance: She is well-developed. She is obese.  HENT:     Head: Normocephalic and atraumatic.  Eyes:     Pupils: Pupils are equal, round, and reactive to light.  Neck:     Thyroid: No thyromegaly.  Cardiovascular:     Rate and Rhythm: Normal rate and regular rhythm.     Heart sounds: Normal heart sounds. No murmur heard. Pulmonary:     Effort: Pulmonary  effort is normal. No respiratory distress.     Breath sounds: Normal breath sounds. No wheezing.  Abdominal:     General: Bowel sounds are normal. There is no distension.     Palpations: Abdomen is soft.     Tenderness: There is no abdominal tenderness.  Musculoskeletal:        General: Tenderness (pain in right lateral  wrist with rotation) present. Normal range of motion.     Cervical back: Normal range of motion and neck supple.  Skin:    General: Skin is warm and dry.  Neurological:     Mental Status: She is alert and oriented to person, place, and time.     Cranial Nerves: No cranial nerve deficit.     Motor: Weakness present.     Gait: Gait abnormal.     Deep Tendon Reflexes: Reflexes are normal and symmetric.  Psychiatric:        Behavior: Behavior normal.        Thought Content: Thought content normal.        Judgment: Judgment normal.         BP 105/61   Pulse 67   Temp 98.2 F (36.8 C) (Temporal)   Resp 20   Ht 5\' 2"  (1.575 m)   Wt 177 lb (80.3 kg)   SpO2 100%   BMI 32.37 kg/m   Assessment & Plan:  Melissa Paul  comes in today with chief complaint of Medical Management of Chronic Issues   Diagnosis and orders addressed:  1. Type 2 diabetes mellitus with hyperglycemia, without long-term current use of insulin (HCC) - Bayer DCA Hb A1c Waived  2. H/O right heart catheterization  3. Right wrist pain X-ray pending Fall precautions discussed  - DG Wrist Complete Right  4. Fall in home, initial encounter - DG Wrist Complete Right  5. Oral thrush Nystatin QID prn Need good control of blood glucose  - nystatin (MYCOSTATIN) 100000 UNIT/ML suspension; Take 5 mLs (500,000 Units total) by mouth 4 (four) times daily.  Dispense: 473 mL; Refill: 0   Labs pending Health Maintenance reviewed Diet and exercise encouraged  Follow up plan: 2 months    Evelina Dun, FNP

## 2020-10-31 NOTE — Patient Instructions (Signed)
Diabetes Mellitus and Nutrition, Adult When you have diabetes, or diabetes mellitus, it is very important to have healthy eating habits because your blood sugar (glucose) levels are greatly affected by what you eat and drink. Eating healthy foods in the right amounts, at about the same times every day, can help you:  Control your blood glucose.  Lower your risk of heart disease.  Improve your blood pressure.  Reach or maintain a healthy weight. What can affect my meal plan? Every person with diabetes is different, and each person has different needs for a meal plan. Your health care provider may recommend that you work with a dietitian to make a meal plan that is best for you. Your meal plan may vary depending on factors such as:  The calories you need.  The medicines you take.  Your weight.  Your blood glucose, blood pressure, and cholesterol levels.  Your activity level.  Other health conditions you have, such as heart or kidney disease. How do carbohydrates affect me? Carbohydrates, also called carbs, affect your blood glucose level more than any other type of food. Eating carbs naturally raises the amount of glucose in your blood. Carb counting is a method for keeping track of how many carbs you eat. Counting carbs is important to keep your blood glucose at a healthy level, especially if you use insulin or take certain oral diabetes medicines. It is important to know how many carbs you can safely have in each meal. This is different for every person. Your dietitian can help you calculate how many carbs you should have at each meal and for each snack. How does alcohol affect me? Alcohol can cause a sudden decrease in blood glucose (hypoglycemia), especially if you use insulin or take certain oral diabetes medicines. Hypoglycemia can be a life-threatening condition. Symptoms of hypoglycemia, such as sleepiness, dizziness, and confusion, are similar to symptoms of having too much  alcohol.  Do not drink alcohol if: ? Your health care provider tells you not to drink. ? You are pregnant, may be pregnant, or are planning to become pregnant.  If you drink alcohol: ? Do not drink on an empty stomach. ? Limit how much you use to:  0-1 drink a day for women.  0-2 drinks a day for men. ? Be aware of how much alcohol is in your drink. In the [place] one drink equals one 12 oz bottle of beer (355 mL), one 5 oz glass of wine (148 mL), or one 1 oz glass of hard liquor (44 mL). ? Keep yourself hydrated with water, diet soda, or unsweetened iced tea.  Keep in mind that regular soda, juice, and other mixers may contain a lot of sugar and must be counted as carbs. What are tips for following this plan? Reading food labels  Start by checking the serving size on the "Nutrition Facts" label of packaged foods and drinks. The amount of calories, carbs, fats, and other nutrients listed on the label is based on one serving of the item. Many items contain more than one serving per package.  Check the total grams (g) of carbs in one serving. You can calculate the number of servings of carbs in one serving by dividing the total carbs by 15. For example, if a food has 30 g of total carbs per serving, it would be equal to 2 servings of carbs.  Check the number of grams (g) of saturated fats and trans fats in one serving. Choose foods that have   a low amount or none of these fats.  Check the number of milligrams (mg) of salt (sodium) in one serving. Most people should limit total sodium intake to less than 2,300 mg per day.  Always check the nutrition information of foods labeled as "low-fat" or "nonfat." These foods may be higher in added sugar or refined carbs and should be avoided.  Talk to your dietitian to identify your daily goals for nutrients listed on the label. Shopping  Avoid buying canned, pre-made, or processed foods. These foods tend to be high in fat, sodium, and added  sugar.  Shop around the outside edge of the grocery store. This is where you will most often find fresh fruits and vegetables, bulk grains, fresh meats, and fresh dairy. Cooking  Use low-heat cooking methods, such as baking, instead of high-heat cooking methods like deep frying.  Cook using healthy oils, such as olive, canola, or sunflower oil.  Avoid cooking with butter, cream, or high-fat meats. Meal planning  Eat meals and snacks regularly, preferably at the same times every day. Avoid going long periods of time without eating.  Eat foods that are high in fiber, such as fresh fruits, vegetables, beans, and whole grains. Talk with your dietitian about how many servings of carbs you can eat at each meal.  Eat 4-6 oz (112-168 g) of lean protein each day, such as lean meat, chicken, fish, eggs, or tofu. One ounce (oz) of lean protein is equal to: ? 1 oz (28 g) of meat, chicken, or fish. ? 1 egg. ?  cup (62 g) of tofu.  Eat some foods each day that contain healthy fats, such as avocado, nuts, seeds, and fish.   What foods should I eat? Fruits Berries. Apples. Oranges. Peaches. Apricots. Plums. Grapes. Mango. Papaya. Pomegranate. Kiwi. Cherries. Vegetables Lettuce. Spinach. Leafy greens, including kale, chard, collard greens, and mustard greens. Beets. Cauliflower. Cabbage. Broccoli. Carrots. Green beans. Tomatoes. Peppers. Onions. Cucumbers. Brussels sprouts. Grains Whole grains, such as whole-wheat or whole-grain bread, crackers, tortillas, cereal, and pasta. Unsweetened oatmeal. Quinoa. Brown or wild rice. Meats and other proteins Seafood. Poultry without skin. Lean cuts of poultry and beef. Tofu. Nuts. Seeds. Dairy Low-fat or fat-free dairy products such as milk, yogurt, and cheese. The items listed above may not be a complete list of foods and beverages you can eat. Contact a dietitian for more information. What foods should I avoid? Fruits Fruits canned with  syrup. Vegetables Canned vegetables. Frozen vegetables with butter or cream sauce. Grains Refined white flour and flour products such as bread, pasta, snack foods, and cereals. Avoid all processed foods. Meats and other proteins Fatty cuts of meat. Poultry with skin. Breaded or fried meats. Processed meat. Avoid saturated fats. Dairy Full-fat yogurt, cheese, or milk. Beverages Sweetened drinks, such as soda or iced tea. The items listed above may not be a complete list of foods and beverages you should avoid. Contact a dietitian for more information. Questions to ask a health care provider  Do I need to meet with a diabetes educator?  Do I need to meet with a dietitian?  What number can I call if I have questions?  When are the best times to check my blood glucose? Where to find more information:  American Diabetes Association: diabetes.org  Academy of Nutrition and Dietetics: www.eatright.org  National Institute of Diabetes and Digestive and Kidney Diseases: www.niddk.nih.gov  Association of Diabetes Care and Education Specialists: www.diabeteseducator.org Summary  It is important to have healthy eating   habits because your blood sugar (glucose) levels are greatly affected by what you eat and drink.  A healthy meal plan will help you control your blood glucose and maintain a healthy lifestyle.  Your health care provider may recommend that you work with a dietitian to make a meal plan that is best for you.  Keep in mind that carbohydrates (carbs) and alcohol have immediate effects on your blood glucose levels. It is important to count carbs and to use alcohol carefully. This information is not intended to replace advice given to you by your health care provider. Make sure you discuss any questions you have with your health care provider. Document Revised: 12/15/2018 Document Reviewed: 12/15/2018 Elsevier Patient Education  2021 Elsevier Inc.  

## 2020-11-02 ENCOUNTER — Other Ambulatory Visit (HOSPITAL_COMMUNITY): Payer: Self-pay

## 2020-11-02 ENCOUNTER — Other Ambulatory Visit: Payer: Self-pay | Admitting: Family

## 2020-11-02 DIAGNOSIS — E1165 Type 2 diabetes mellitus with hyperglycemia: Secondary | ICD-10-CM

## 2020-11-02 MED ORDER — VERQUVO 5 MG PO TABS: 5.0000 mg | ORAL_TABLET | Freq: Every day | ORAL | 0 refills | Status: DC

## 2020-11-02 MED ORDER — INSULIN DEGLUDEC 100 UNIT/ML ~~LOC~~ SOPN: 12.0000 [IU] | PEN_INJECTOR | Freq: Every day | SUBCUTANEOUS | 2 refills | Status: DC

## 2020-11-02 NOTE — Telephone Encounter (Signed)
Advanced Heart Failure Patient Advocate Encounter   Patient was approved to receive Verquvo from [facility]  Patient ID: [id number] Effective dates: 11/01/20 through 01/20/21  Attempted to call [facility] several times, not able to get in touch with anyone to confirm the 5mg  RX was received. Called and spoke with patient's daughter, Terrence Dupont. Sent Chantel (CMA) a one time RX request to send to [facility]. Attached the free trial information to RX and emailed free trial information to the patient's daughter.  She is aware that I am going to follow up with [facility]. Stated that [facility] has yet to contact them.

## 2020-11-13 NOTE — Progress Notes (Signed)
Cardiology Office Note  Date: 11/14/2020   ID: ULAH OLMO, DOB [birth year], MRN [id number]  PCP:  Sharion Balloon, FNP  Cardiologist:  Rozann Lesches, MD Electrophysiologist:  Thompson Grayer, MD   Chief Complaint  Patient presents with   Cardiac follow-up    History of Present Illness: Melissa Paul is a 75 y.o. female last seen in April.  She has had interval follow-up in the heart failure clinic, I reviewed Dr. Claris Gladden most recent note and subsequent testing.  She is here today with her daughter.  She states that she feels somewhat better, weight has been stable, still with NYHA class III symptoms.  She does not appear fluid overloaded today, lungs are clear.  She underwent recent right heart catheterization, results noted below.  Also cardiac monitoring to assess PVC burden and pending echocardiogram.  States that she was not able to wear the Zio patch, skin was irritated and it fell off in less than 12 hours.  Echocardiogram is pending for this Friday with visit in the heart failure clinic.  She has a [employer]. Jude biventricular ICD in place with followed by Dr. [name].  Most recent thoracic impedance suggested normal fluid levels.  Past Medical History:  Diagnosis Date   Anxiety    Chronic systolic heart failure (HCC)    Constipation    Cyst of right kidney 11/12/2017   Glucose intolerance (pre-diabetes)    Hyperlipemia    Hypothyroidism    LBBB (left bundle branch block)    Mitral regurgitation    Mild to Moderate   Nonischemic cardiomyopathy (HCC)    LVEF 35-40% by echo 8/13   Paroxysmal atrial fibrillation (HCC)    Brief episodes by device interrogation   UTI (urinary tract infection)    Recurrent   Ventricular tachycardia    ICD therapy for VT    Past Surgical History:  Procedure Laterality Date   APPENDECTOMY     BIV ICD GENERATOR CHANGEOUT N/A 07/11/2016   Procedure: BiV ICD Generator Changeout;  Surgeon: Thompson Grayer, MD;  Location: [facility];  Service: Cardiovascular;  Laterality: N/A;   CARDIAC DEFIBRILLATOR PLACEMENT  09/2009   St. Jude BiV ICD by Dr. Sandria Manly class III   CHOLECYSTECTOMY     has one ovary left     KNEE ARTHROSCOPY     Left   LEFT AND RIGHT HEART CATHETERIZATION WITH CORONARY ANGIOGRAM N/A 04/12/2014   Procedure: LEFT AND RIGHT HEART CATHETERIZATION WITH CORONARY ANGIOGRAM;  Surgeon: Leonie Man, MD;  [facility];  Service: Cardiovascular;  Laterality: N/A;   Left breast biopsy x 2     no maliganancy   PACEMAKER IMPLANT     RIGHT HEART CATH N/A 10/27/2020   Procedure: RIGHT HEART CATH;  Surgeon: [name], MD;  Location: [place] [facility];  Service: Cardiovascular;  Laterality: N/A;   RIGHT/LEFT HEART CATH AND CORONARY ANGIOGRAPHY N/A 03/26/2019   Procedure: RIGHT/LEFT HEART CATH AND CORONARY ANGIOGRAPHY;  Surgeon: [name], MD;  Location: [facility];  Service: Cardiovascular;  Laterality: N/A;   TOTAL ABDOMINAL HYSTERECTOMY     (R) ovary removed    Current Outpatient Medications  Medication Sig Dispense Refill   acetaminophen (TYLENOL) 650 MG CR tablet Take 650 mg by mouth every 8 (eight) hours as needed for pain.     apixaban (ELIQUIS) 5 MG TABS tablet Take 1 tablet (5 mg total) by mouth 2 (two) times daily. Need  to schedule an appointment for further refills 180 tablet 0   blood glucose meter kit and supplies Dispense based on patient and insurance preference. Use up to four times daily as directed. (FOR ICD-10 E10.9, E11.9). 1 each 0   carvedilol (COREG) 12.5 MG tablet Take 1 tablet (12.5 mg total) by mouth 2 (two) times daily. 180 tablet 3   cetirizine (ZYRTEC) 10 MG tablet Take 10 mg by mouth daily.     clobetasol cream (TEMOVATE) 0.05 % Use 2 x daily for 2 weeks then 2-3 x weekly 45 g 3   dapagliflozin propanediol (FARXIGA) 10 MG TABS tablet Take 10 mg by mouth daily.     furosemide (LASIX) 80 MG tablet Take 80 mg by mouth daily. Take and additional 80 mg if  needed     hydrOXYzine (VISTARIL) 25 MG capsule TAKE 1 CAPSULE BY MOUTH THREE TIMES DAILY 90 capsule 0   insulin degludec (TRESIBA) 100 UNIT/ML FlexTouch Pen Inject 12 Units into the skin daily. 6 mL 2   levothyroxine (EUTHYROX) 88 MCG tablet TAKE 1 TABLET BY MOUTH ONCE DAILY BEFORE BREAKFAST *NEED LABWORK* 90 tablet 4   metFORMIN (GLUCOPHAGE-XR) 750 MG 24 hr tablet Take 750 mg by mouth 2 (two) times daily.     nystatin (MYCOSTATIN) 100000 UNIT/ML suspension Take 5 mLs (500,000 Units total) by mouth 4 (four) times daily. 473 mL 0   pantoprazole (PROTONIX) 40 MG tablet Take 40 mg by mouth daily.     spironolactone (ALDACTONE) 25 MG tablet Take 1 tablet (25 mg total) by mouth daily. (Patient taking differently: Take 25 mg by mouth at bedtime.) 90 tablet 3   Vericiguat (VERQUVO) 5 MG TABS Take 1 tablet (5 mg total) by mouth daily at 12 noon. 30 tablet 0   No current facility-administered medications for this visit.   Allergies:  Buspar [buspirone], Lexapro [escitalopram oxalate], Bactrim, Cephalexin, Chlorhexidine gluconate, Clindamycin, Contrast media [iodinated diagnostic agents], Doxycycline, Latex, Levofloxacin, Nitrofurantoin monohyd macro, and Penicillins   ROS: No syncope.  Physical Exam: VS:  BP 102/62 (BP Location: Left Arm, Patient Position: Sitting, Cuff Size: Normal)   Pulse 70   Ht $R'5\' 2"'ke$  (1.575 m)   Wt 176 lb 9.6 oz (80.1 kg)   SpO2 99%   BMI 32.30 kg/m , BMI Body mass index is 32.3 kg/m.  Wt Readings from Last 3 Encounters:  11/14/20 176 lb 9.6 oz (80.1 kg)  10/31/20 177 lb (80.3 kg)  10/27/20 174 lb (78.9 kg)    General: Patient appears comfortable at rest. HEENT: Conjunctiva and lids normal, wearing a mask. Neck: Supple, no elevated JVP or carotid bruits, no thyromegaly. Lungs: Clear to auscultation, nonlabored breathing at rest. Cardiac: Regular rate and rhythm, no S3 or significant systolic murmur. Extremities: No pitting edema.  ECG:  An ECG dated 10/16/2020 was  personally reviewed today and demonstrated:  Dual chamber pacing.  Recent Labwork: 04/26/2020: TSH 2.960 06/27/2020: ALT 10; AST 9 10/16/2020: B Natriuretic Peptide 247.9; BUN 13; Creatinine, Ser 1.15; Platelets 309 10/27/2020: Hemoglobin 10.9; Potassium 3.2; Sodium 141     Component Value Date/Time   CHOL 156 10/19/2018 1133   TRIG 89 10/19/2018 1133   HDL 45 10/19/2018 1133   CHOLHDL 3.5 10/19/2018 1133   CHOLHDL 3.3 04/11/2007 0600   VLDL 10 04/11/2007 0600   LDLCALC 94 10/19/2018 1133    Other Studies Reviewed Today:  Echocardiogram 08/11/2019:  1. Left ventricular ejection fraction, by estimation, is approximately  35%. The left ventricle demonstrates regional  wall motion abnormalities  (see scoring diagram/findings for description). The left ventricular  internal cavity size was moderately  dilated. Left ventricular diastolic parameters are consistent with Grade I  diastolic dysfunction (impaired relaxation).   2. Right ventricular systolic function is normal. The right ventricular  size is normal. A device wire is visualized. There is normal pulmonary  artery systolic pressure. The estimated right ventricular systolic  pressure is 83.2 mmHg.   3. Left atrial size was mildly dilated.   4. The mitral valve is abnormal, mildly thickened and calcified. Mild to  moderate mitral valve regurgitation.   5. The aortic valve is tricuspid. Aortic valve regurgitation is trivial.   6. The inferior vena cava is normal in size with greater than 50%  respiratory variability, suggesting right atrial pressure of 3 mmHg.   Right heart catheterization 10/27/2020: [facility] Procedural Findings: Hemodynamics (mmHg) RA mean 1 RV 19/1 PA 19/6, mean 11 PCWP mean 6  Oxygen saturations: PA 75% AO 100%  Cardiac Output (Fick) 6.1  Cardiac Index (Fick) 3.34  1. Low filling pressures.  2. Preserved cardiac output.   Assessment and Plan:  1.  HFrEF, chronic systolic heart failure with known  nonischemic cardiomyopathy.  Last LVEF was approximately 35% with follow-up echocardiogram pending this Friday.  Symptoms seem out of proportion to recent hemodynamic evaluation.  Weight is stable.  Plan to continue Coreg, Aldactone, Verquvo, and Lasix.  2.  History of PVCs and VT.  St. Jude biventricular ICD in place.  No recent device shocks or syncope.  She was not able to wear Zio patch due to skin irritation.  Device interrogation in early October indicated normal fluid levels.  3.  Paroxysmal atrial fibrillation with relatively low rhythm burden based on device interrogation.  She is on Eliquis for stroke prophylaxis.  Medication Adjustments/Labs and Tests Ordered: Current medicines are reviewed at length with the patient today.  Concerns regarding medicines are outlined above.   Tests Ordered: No orders of the defined types were placed in this encounter.   Medication Changes: No orders of the defined types were placed in this encounter.   Disposition:  Follow up  3 months.  Signed, Satira Sark, MD, [facility] 11/14/2020 9:47 AM    [facility], [place], [place] [address] Phone: [phone]; Fax: [phone]

## 2020-11-14 ENCOUNTER — Encounter: Payer: Self-pay | Admitting: Cardiology

## 2020-11-14 ENCOUNTER — Ambulatory Visit (INDEPENDENT_AMBULATORY_CARE_PROVIDER_SITE_OTHER): Payer: Medicare Other | Admitting: Cardiology

## 2020-11-14 VITALS — BP 102/62 | HR 70 | Ht 62.0 in | Wt 176.6 lb

## 2020-11-14 DIAGNOSIS — I5020 Unspecified systolic (congestive) heart failure: Secondary | ICD-10-CM | POA: Diagnosis not present

## 2020-11-14 DIAGNOSIS — I480 Paroxysmal atrial fibrillation: Secondary | ICD-10-CM

## 2020-11-14 DIAGNOSIS — I428 Other cardiomyopathies: Secondary | ICD-10-CM

## 2020-11-14 NOTE — Patient Instructions (Addendum)
Medication Instructions:   Your physician recommends that you continue on your current medications as directed. Please refer to the Current Medication list given to you today.  Labwork:  none  Testing/Procedures:  none  Follow-Up:  Your physician recommends that you schedule a follow-up appointment in: 3 months.  Any Other Special Instructions Will Be Listed Below (If Applicable).  If you need a refill on your cardiac medications before your next appointment, please call your pharmacy. 

## 2020-11-15 ENCOUNTER — Ambulatory Visit (INDEPENDENT_AMBULATORY_CARE_PROVIDER_SITE_OTHER): Payer: Medicare Other | Admitting: Pharmacist

## 2020-11-15 ENCOUNTER — Encounter: Payer: Self-pay | Admitting: Adult Health

## 2020-11-15 ENCOUNTER — Ambulatory Visit (INDEPENDENT_AMBULATORY_CARE_PROVIDER_SITE_OTHER): Payer: Medicare Other | Admitting: Adult Health

## 2020-11-15 ENCOUNTER — Other Ambulatory Visit: Payer: Self-pay

## 2020-11-15 VITALS — BP 91/60 | HR 69 | Ht 64.0 in | Wt 175.0 lb

## 2020-11-15 DIAGNOSIS — E1165 Type 2 diabetes mellitus with hyperglycemia: Secondary | ICD-10-CM

## 2020-11-15 DIAGNOSIS — L900 Lichen sclerosus et atrophicus: Secondary | ICD-10-CM

## 2020-11-15 NOTE — Progress Notes (Signed)
Chronic Care Management Pharmacy Note  11/15/2020 Name:  Melissa Paul MRN:  [id number] DOB:  [birth year]  Summary: T2DM  Recommendations/Changes made from today's visit: Diabetes: Uncontrolled-A1C 8.2%; current treatment: METFORMIN D/C'D [place] DUE TO RECURRENT YEAST INFECTIONS Suggested once weekly trulicity (we are out of samples) 0.75MG SQ WEEKLY Denies personal and family history of Medullary thyroid cancer (MTC) Will apply for lilly cares patient assistance program -- [place] to transition to trulicity and metformin once patient assistance complete--does not want to be on insulin (can't afford) Current glucose readings: fasting glucose: <180, post prandial glucose: n/a Denies hypoglycemic/hyperglycemic symptoms Discussed meal planning options and Plate method for healthy eating Avoid sugary drinks and desserts Incorporate balanced protein, non starchy veggies, 1 serving of carbohydrate with each meal Increase water intake Increase physical activity as able Current exercise: n/a Recommended trulicity Assessed patient finances. Application to be submitted to lilly cares patient assistance program  Follow Up Plan: Telephone follow up appointment with care management team member scheduled for: 1 MONTH  Subjective: Melissa Paul is an 75 y.o. year old female who is a primary patient of Sharion Balloon, FNP.  The CCM team was consulted for assistance with disease management and care coordination needs.    Engaged with patient by telephone for follow up visit in response to provider referral for pharmacy case management and/or care coordination services.   Consent to Services:  The patient was given information about Chronic Care Management services, agreed to services, and gave verbal consent prior to initiation of services.  Please see initial visit note for detailed documentation.   Patient Care Team: Sharion Balloon, FNP as PCP - General  (Family Medicine) Thompson Grayer, MD as PCP - Electrophysiology (Cardiology) Satira Sark, MD as PCP - Cardiology (Cardiology) Lavera Guise, [facility] as Pharmacist (Family Medicine)  Objective:  Lab Results  Component Value Date   CREATININE 1.15 (H) 10/16/2020   CREATININE 1.31 (H) 08/29/2020   CREATININE 1.13 (H) 07/11/2020    Lab Results  Component Value Date   HGBA1C 8.2 (H) 10/31/2020   Last diabetic Eye exam: No results found for: HMDIABEYEEXA  Last diabetic Foot exam: No results found for: HMDIABFOOTEX      Component Value Date/Time   CHOL 156 10/19/2018 1133   TRIG 89 10/19/2018 1133   HDL 45 10/19/2018 1133   CHOLHDL 3.5 10/19/2018 1133   CHOLHDL 3.3 04/11/2007 0600   VLDL 10 04/11/2007 0600   LDLCALC 94 10/19/2018 1133    Hepatic Function Latest Ref Rng & Units 06/27/2020 05/02/2020 04/26/2020  Total Protein 6.0 - 8.5 g/dL 7.2 6.5 6.9  Albumin 3.7 - 4.7 g/dL 4.7 4.2 4.5  AST 0 - 40 IU/L '9 11 10  ' ALT 0 - 32 IU/L '10 10 12  ' Alk Phosphatase 44 - 121 IU/L 78 80 69  Total Bilirubin 0.0 - 1.2 mg/dL 0.5 0.3 0.4  Bilirubin, Direct 0.1 - 0.5 mg/dL - - -    Lab Results  Component Value Date/Time   TSH 2.960 04/26/2020 04:56 PM   TSH 8.680 (H) 10/19/2018 11:33 AM    CBC Latest Ref Rng & Units 10/27/2020 10/27/2020 10/16/2020  WBC 4.0 - 10.5 K/uL - - 7.9  Hemoglobin 12.0 - 15.0 g/dL 10.9(L) 11.2(L) 11.7(L)  Hematocrit 36.0 - 46.0 % 32.0(L) 33.0(L) 37.1  Platelets 150 - 400 K/uL - - 309    No results found for: VD25OH  Clinical ASCVD: No  The 10-year ASCVD  risk score (Arnett DK, et al., 2019) is: 24.6%   Values used to calculate the score:     Age: 75 years     Sex: Female     Is Non-Hispanic African American: No     Diabetic: Yes     Tobacco smoker: No     Systolic Blood Pressure: 91 mmHg     Is BP treated: Yes     HDL Cholesterol: 45 mg/dL     Total Cholesterol: 156 mg/dL    Other: (CHADS2VASc if Afib, PHQ9 if depression, MMRC or CAT for COPD, ACT,  DEXA)  Social History   Tobacco Use  Smoking Status Former   Packs/day: 0.30   Years: 20.00   Pack years: 6.00   Types: Cigarettes   Start date: 08/03/1949   Quit date: 01/22/1996   Years since quitting: 24.8  Smokeless Tobacco Never   BP Readings from Last 3 Encounters:  11/15/20 91/60  11/14/20 102/62  10/31/20 105/61   Pulse Readings from Last 3 Encounters:  11/15/20 69  11/14/20 70  10/31/20 67   Wt Readings from Last 3 Encounters:  11/15/20 175 lb (79.4 kg)  11/14/20 176 lb 9.6 oz (80.1 kg)  10/31/20 177 lb (80.3 kg)    Assessment: Review of patient past medical history, allergies, medications, health status, including review of consultants reports, laboratory and other test data, was performed as part of comprehensive evaluation and provision of chronic care management services.   SDOH:  (Social Determinants of Health) assessments and interventions performed:    CCM Care Plan  Allergies  Allergen Reactions   Buspar [Buspirone] Other (See Comments)    Patient states that she became hyper and had lots of itching.   Lexapro [Escitalopram Oxalate] Other (See Comments)    Patient states that she became hyper and lots of itching   Bactrim Rash   Cephalexin Swelling and Rash    Says she is able to take cefuroxime Throat swelling   Chlorhexidine Gluconate Hives and Itching    Hives, itching after using wipes with last procedure   Clindamycin Rash        Contrast Media [Iodinated Diagnostic Agents] Rash   Doxycycline Rash   Latex Rash        Levofloxacin Rash        Nitrofurantoin Monohyd Macro Rash        Penicillins Swelling and Rash    Has patient had a PCN reaction causing immediate rash, facial/tongue/throat swelling, SOB or lightheadedness with hypotension: Yes Has patient had a PCN reaction causing severe rash involving mucus membranes or skin necrosis: Yes Has patient had a PCN reaction that required hospitalization: No Has patient had a PCN  reaction occurring within the last 10 years: No Rash/swelled tongue If all of the above answers are "NO", then may proceed with Cephalosporin use.     Medications Reviewed Today     Reviewed by Lavera Guise, [facility] (Pharmacist) on 11/15/20 at 1317  Med List Status: <None>   Medication Order Taking? Sig Documenting Provider Last Dose Status Informant  acetaminophen (TYLENOL) 650 MG CR tablet [id number]  Take 650 mg by mouth every 8 (eight) hours as needed for pain. [provider]  Active Self  apixaban (ELIQUIS) 5 MG TABS tablet [id number]  Take 1 tablet (5 mg total) by mouth 2 (two) times daily. Need to schedule an appointment for further refills [name], MD  Active Self  blood glucose meter kit and supplies [id number]  Dispense based on patient and insurance preference. Use up to four times daily as directed. (FOR ICD-10 E10.9, E11.9). Sharion Balloon, FNP  Active Self  carvedilol (COREG) 12.5 MG tablet [id number]  Take 1 tablet (12.5 mg total) by mouth 2 (two) times daily. [name], MD  Active Self  cetirizine (ZYRTEC) 10 MG tablet [id number]  Take 10 mg by mouth daily. [provider]  Active Self  clobetasol cream (TEMOVATE) 0.05 % [id number]  Use 2 x daily for 2 weeks then 2-3 x weekly Derrek Monaco A, NP  Active Self  dapagliflozin propanediol (FARXIGA) 10 MG TABS tablet [id number]  Take 10 mg by mouth daily. [provider]  Active Self  furosemide (LASIX) 80 MG tablet [id number]  Take 80 mg by mouth daily. Take and additional 80 mg if needed [provider]  Active Self  hydrOXYzine (VISTARIL) 25 MG capsule [id number]  TAKE 1 CAPSULE BY MOUTH THREE TIMES DAILY Hawks, Christy A, FNP  Active Self  insulin degludec (TRESIBA) 100 UNIT/ML FlexTouch Pen [id number]  Inject 12 Units into the skin daily. Evelina Dun A, FNP  Active   levothyroxine (EUTHYROX) 88 MCG tablet [id number]  TAKE 1 TABLET BY MOUTH ONCE DAILY BEFORE BREAKFAST *NEED  LABWORK* Hawks, Christy A, FNP  Active Self  metFORMIN (GLUCOPHAGE-XR) 750 MG 24 hr tablet [id number]  Take 750 mg by mouth 2 (two) times daily. [provider]  Active Self  nystatin (MYCOSTATIN) 100000 UNIT/ML suspension [id number]  Take 5 mLs (500,000 Units total) by mouth 4 (four) times daily. Evelina Dun A, FNP  Active   pantoprazole (PROTONIX) 40 MG tablet [id number]  Take 40 mg by mouth daily. [provider]  Active Self  spironolactone (ALDACTONE) 25 MG tablet [id number]  Take 1 tablet (25 mg total) by mouth daily.  Patient taking differently: Take 25 mg by mouth at bedtime.   Bensimhon, Shaune Pascal, MD  Active   Vericiguat (VERQUVO) 5 MG TABS [id number]  Take 1 tablet (5 mg total) by mouth daily at 12 noon. [name], MD  Active             Patient Active Problem List   Diagnosis Date Noted   Lichen sclerosus et atrophicus 10/17/2020   Cystocele, midline 09/22/2020   Vulvar irritation 09/22/2020   Vaginal irritation 09/22/2020   Recurrent UTI 04/26/2020   Osteoporosis 11/16/2019   GAD (generalized anxiety disorder) 10/19/2018   Constipation 11/12/2017   Cyst of right kidney 11/12/2017   Type 2 diabetes mellitus with hyperglycemia, without long-term current use of insulin ([place]) 08/11/2017   Acute on chronic systolic ACC/AHA stage C congestive heart failure ([place]) 06/08/2014   Abnormal myocardial perfusion study    Exertional dyspnea - NYHA III CHF 04/11/2014   Abnormal nuclear stress test 04/11/2014   Ventricular tachyarrhythmia 03/17/2014   Atrial fibrillation ([place]) 10/10/2011   Chronic hypotension 03/14/2011   Automatic implantable cardioverter-defibrillator in situ 09/13/2010   MITRAL REGURGITATION 96/75/9163   Chronic systolic heart failure ([place]) 07/14/2009   Hypothyroidism 11/29/2008   Nonischemic cardiomyopathy ([place]) 11/29/2008   LBBB 11/29/2008    Immunization History  Administered Date(s) Administered   Influenza,inj,Quad PF,6+ Mos  11/12/2017   Influenza-Unspecified 11/11/2008, 01/08/2011   Pneumococcal Polysaccharide-23 07/11/2009    Conditions to be addressed/monitored: DMII  Care Plan : PHARMD MEDICATION MANAGEMENT  Updates made by Lavera Guise, [place] since 11/15/2020 12:00 AM     Problem: DISEASE PROGRESSION PREVENTION      Long-Range Goal: T2DM  Recent Progress: Not on track  Priority: High  Note:   Current Barriers:  Unable to independently afford treatment regimen Unable to achieve control of T2DM   Pharmacist Clinical Goal(s):  Over the next 90 days, patient will verbalize ability to afford treatment regimen achieve control of T2DM as evidenced by IMPROVED GLYCEMIC CONTROL through collaboration with PharmD and provider.    Interventions: 1:1 collaboration with Sharion Balloon, FNP regarding development and update of comprehensive plan of care as evidenced by provider attestation and co-signature Inter-disciplinary care team collaboration (see longitudinal plan of care) Comprehensive medication review performed; medication list updated in electronic medical record  Diabetes: Uncontrolled-A1C 8.2%; current treatment: METFORMIN D/C'D [place] DUE TO RECURRENT YEAST INFECTIONS Suggested once weekly trulicity (we are out of samples) 0.75MG SQ WEEKLY Denies personal and family history of Medullary thyroid cancer (MTC) Will apply for lilly cares patient assistance program -- [place] to transition to trulicity and metformin once patient assistance complete--does not want to be on insulin (can't afford) Current glucose readings: fasting glucose: <180, post prandial glucose: n/a Denies hypoglycemic/hyperglycemic symptoms Discussed meal planning options and Plate method for healthy eating Avoid sugary drinks and desserts Incorporate balanced protein, non starchy veggies, 1 serving of carbohydrate with each meal Increase water intake Increase physical activity as able Current  exercise: n/a Recommended trulicity Assessed patient finances. Application to be submitted to lilly cares patient assistance program   Patient Goals/Self-Care Activities Over the next 90 days, patient will:  - take medications as prescribed collaborate with provider on medication access solutions  Follow Up Plan: Telephone follow up appointment with care management team member scheduled for: 1 MONTH      Medication Assistance: Application for [employer]  medication assistance program. in process.  Anticipated assistance start date TBD.  See plan of care for additional detail.  Patient's preferred pharmacy is:  [facility] [address] [place] [address] Phone: [phone] Fax: [place], [place] [place] [address] Phone: [phone] Fax: [phone]  Follow Up:  Patient agrees to Care Plan and Follow-up.  Plan: Telephone follow up appointment with care management team member scheduled for:  3 WEEKS    Regina Eck, PharmD, BCPS Clinical Pharmacist, [place]  II Phone [phone]

## 2020-11-15 NOTE — Progress Notes (Signed)
  Subjective:     Patient ID: Melissa Paul, female   DOB: [birth year], 75 y.o.   MRN: [id number]  HPI Melissa Paul is a 75 year old white female, widowed, sp hysterectomy back in follow up on having started using temovate and feels much better.Just mildly sore at clitoris area. PCP is C. Hawks NP   Review of Systems Feels mush better, mild soreness She says knees hurt today, using her walker  Reviewed past medical,surgical, social and family history. Reviewed medications and allergies.     Objective:   Physical Exam BP 91/60 (BP Location: Right Arm, Patient Position: Sitting, Cuff Size: Normal)   Pulse 69   Ht 5\' 4"  (1.626 m)   Wt 175 lb (79.4 kg)   BMI 30.04 kg/m     Skin warm and dry, skin less thin, no tear now near clitoris area Examination chaperoned by Melissa Bamberg LPN.  Assessment:     1. Lichen sclerosus et atrophicus Continue temovate, has refills     Plan:     Follow up in 4 months or sooner if needed

## 2020-11-15 NOTE — Patient Instructions (Signed)
Visit Information  PATIENT GOALS:  Goals Addressed               This Visit's Progress     Patient Stated     T2DM PHARMD GOAL (pt-stated)        Current Barriers:  Unable to independently afford treatment regimen Unable to achieve control of T2DM   Pharmacist Clinical Goal(s):  Over the next 90 days, patient will verbalize ability to afford treatment regimen achieve control of T2DM as evidenced by IMPROVED GLYCEMIC CONTROL through collaboration with PharmD and provider.    Interventions: 1:1 collaboration with Sharion Balloon, FNP regarding development and update of comprehensive plan of care as evidenced by provider attestation and co-signature Inter-disciplinary care team collaboration (see longitudinal plan of care) Comprehensive medication review performed; medication list updated in electronic medical record  Diabetes: Uncontrolled-A1C 8.2%; current treatment: METFORMIN D/C'D [place] DUE TO RECURRENT YEAST INFECTIONS Suggested once weekly trulicity (we are out of samples) 0.75MG  SQ WEEKLY Denies personal and family history of Medullary thyroid cancer (MTC) Will apply for lilly cares patient assistance program -- [place] to transition to trulicity and metformin once patient assistance complete--does not want to be on insulin (can't afford) Current glucose readings: fasting glucose: <180, post prandial glucose: n/a Denies hypoglycemic/hyperglycemic symptoms Discussed meal planning options and Plate method for healthy eating Avoid sugary drinks and desserts Incorporate balanced protein, non starchy veggies, 1 serving of carbohydrate with each meal Increase water intake Increase physical activity as able Current exercise: n/a Recommended trulicity Assessed patient finances. Application to be submitted to lilly cares patient assistance program   Patient Goals/Self-Care Activities Over the next 90 days, patient will:  - take medications as  prescribed collaborate with provider on medication access solutions  Follow Up Plan: Telephone follow up appointment with care management team member scheduled for: 1 MONTH         The patient verbalized understanding of instructions, educational materials, and care plan provided today and declined offer to receive copy of patient instructions, educational materials, and care plan.   Telephone follow up appointment with care management team member scheduled for: 3 WEEKS   Regina Eck, PharmD, BCPS Clinical Pharmacist, [place]  II Phone [phone]

## 2020-11-16 ENCOUNTER — Other Ambulatory Visit (HOSPITAL_COMMUNITY): Payer: Medicare Other

## 2020-11-16 ENCOUNTER — Ambulatory Visit: Payer: Medicare Other | Admitting: Adult Health

## 2020-11-16 ENCOUNTER — Telehealth: Payer: Medicare Other

## 2020-11-16 ENCOUNTER — Encounter (HOSPITAL_COMMUNITY): Payer: Medicare Other | Admitting: Cardiology

## 2020-11-17 ENCOUNTER — Ambulatory Visit (HOSPITAL_COMMUNITY)
Admission: RE | Admit: 2020-11-17 | Discharge: 2020-11-17 | Disposition: A | Discharge status: Home / Self Care | Payer: Medicare Other | Source: Ambulatory Visit | Attending: Cardiology | Admitting: Cardiology

## 2020-11-17 ENCOUNTER — Encounter (HOSPITAL_COMMUNITY): Payer: Self-pay | Admitting: Cardiology

## 2020-11-17 ENCOUNTER — Other Ambulatory Visit: Payer: Self-pay

## 2020-11-17 ENCOUNTER — Encounter (HOSPITAL_COMMUNITY): Payer: Medicare Other | Admitting: Cardiology

## 2020-11-17 ENCOUNTER — Ambulatory Visit (HOSPITAL_BASED_OUTPATIENT_CLINIC_OR_DEPARTMENT_OTHER)
Admission: RE | Admit: 2020-11-17 | Discharge: 2020-11-17 | Disposition: A | Discharge status: Home / Self Care | Payer: Medicare Other | Source: Ambulatory Visit | Attending: Cardiology | Admitting: Cardiology

## 2020-11-17 VITALS — BP 114/62 | HR 72 | Wt 177.6 lb

## 2020-11-17 DIAGNOSIS — R0602 Shortness of breath: Secondary | ICD-10-CM | POA: Insufficient documentation

## 2020-11-17 DIAGNOSIS — I480 Paroxysmal atrial fibrillation: Secondary | ICD-10-CM | POA: Diagnosis not present

## 2020-11-17 DIAGNOSIS — Z87891 Personal history of nicotine dependence: Secondary | ICD-10-CM | POA: Diagnosis not present

## 2020-11-17 DIAGNOSIS — Z7901 Long term (current) use of anticoagulants: Secondary | ICD-10-CM | POA: Insufficient documentation

## 2020-11-17 DIAGNOSIS — I5022 Chronic systolic (congestive) heart failure: Secondary | ICD-10-CM

## 2020-11-17 DIAGNOSIS — I351 Nonrheumatic aortic (valve) insufficiency: Secondary | ICD-10-CM | POA: Diagnosis not present

## 2020-11-17 DIAGNOSIS — Z79899 Other long term (current) drug therapy: Secondary | ICD-10-CM | POA: Diagnosis not present

## 2020-11-17 DIAGNOSIS — I428 Other cardiomyopathies: Secondary | ICD-10-CM | POA: Insufficient documentation

## 2020-11-17 DIAGNOSIS — I059 Rheumatic mitral valve disease, unspecified: Secondary | ICD-10-CM | POA: Diagnosis not present

## 2020-11-17 DIAGNOSIS — Z8249 Family history of ischemic heart disease and other diseases of the circulatory system: Secondary | ICD-10-CM | POA: Diagnosis not present

## 2020-11-17 DIAGNOSIS — I447 Left bundle-branch block, unspecified: Secondary | ICD-10-CM | POA: Insufficient documentation

## 2020-11-17 DIAGNOSIS — Z09 Encounter for follow-up examination after completed treatment for conditions other than malignant neoplasm: Secondary | ICD-10-CM | POA: Diagnosis not present

## 2020-11-17 DIAGNOSIS — I5020 Unspecified systolic (congestive) heart failure: Secondary | ICD-10-CM | POA: Diagnosis not present

## 2020-11-17 DIAGNOSIS — Z7985 Long-term (current) use of injectable non-insulin antidiabetic drugs: Secondary | ICD-10-CM | POA: Diagnosis not present

## 2020-11-17 LAB — ECHOCARDIOGRAM COMPLETE
AV Mean grad: 2 mmHg
AV Peak grad: 4.2 mmHg
Ao pk vel: 1.02 m/s
Area-P 1/2: 4.44 cm2
S' Lateral: 5.6 cm

## 2020-11-17 MED ORDER — VERQUVO 5 MG PO TABS: 7.5000 mg | ORAL_TABLET | Freq: Every day | ORAL | 6 refills | Status: DC

## 2020-11-17 MED ORDER — DAPAGLIFLOZIN PROPANEDIOL 10 MG PO TABS: 10.0000 mg | ORAL_TABLET | Freq: Every day | ORAL | 6 refills | Status: DC

## 2020-11-17 NOTE — Progress Notes (Signed)
Medication Samples have been provided to the patient.  Drug name: Truett [place]       Strength: 5mg         Qty: 2  LOT: [id number]  Exp.Date: 2/23  Dosing instructions: Take 1 & 1/2 tabs Daily  The patient has been instructed regarding the correct time, dose, and frequency of taking this medication, including desired effects and most common side effects.   Donella Pascarella 4:28 PM 11/17/2020

## 2020-11-17 NOTE — Patient Instructions (Signed)
Increase Verquvo to 7.5 mg (1 & 1/2 tabs) Daily  You have been referred to Cardiac Rehab at [facility], they will call you to schedule  Your physician recommends that you schedule a follow-up appointment in: 3 months  If you have any questions or concerns before your next appointment please send [place] a message through [place] or call our office at [phone].    TO LEAVE A MESSAGE FOR THE NURSE SELECT OPTION 2, PLEASE LEAVE A MESSAGE INCLUDING: YOUR NAME DATE OF BIRTH CALL BACK NUMBER REASON FOR CALL**this is important as we prioritize the call backs  YOU WILL RECEIVE A CALL BACK THE SAME DAY AS LONG AS YOU CALL BEFORE 4:00 PM  At the [facility], you and your health needs are our priority. As part of our continuing mission to provide you with exceptional heart care, we have created designated Provider Care Teams. These Care Teams include your primary Cardiologist (physician) and Advanced Practice Providers (APPs- Physician Assistants and Nurse Practitioners) who all work together to provide you with the care you need, when you need it.   You may see any of the following providers on your designated Care Team at your next follow up: Dr Glori Bickers Dr Haynes Kerns, NP Lyda Jester, [place] [facility] [place], [place] Audry Riles, PharmD   Please be sure to bring in all your medications bottles to every appointment.

## 2020-11-19 NOTE — Progress Notes (Signed)
PCP: Sharion Balloon, FNP Cardiology: Dr. Domenic Polite EP: Dr. [name] HF Cardiology: Dr. Aundra Dubin  75 y.o. with history of CHF/nonischemic cardiomyopathy, paroxysmal atrial fibrillation, and VT referred by Dr. [name] for CHF evaluation. Cardiomyopathy was diagnosed years ago, she had an echo in 8/13 with EF 35-40%.  Due to LBBB, she had St Jude BiV ICD placed.  Cath in 2016 showed no significant coronary disease. Most recent echo in 8/20 showed a severely dilated LV with EF 25-30%.    In 1/21, she had an episode of polymorphic VT with syncope, ICD discharged appropriately. This was in the setting of GI illness with low K, amiodarone was not started.   She has had a clinical decline over the last year.  She has struggled recently with dyspnea.  Using rolling walker, has to stop several times walking into the office.  She is short of breath walking around her house.  She does report chest tightness along with the dyspnea when she walks.  Weight has trended up but only mildly.   I set her up for LHC/RHC in 3/21.  This showed no significant CAD and low filling pressures along with preserved cardiac output.  Echo in 7/21 showed EF 35%, moderate LV dilation, normal RV.   She snores and has daytime sleepiness, but has not wanted sleep study and says she would not wear CPAP.   RHC in 10/22 showed low filling pressures and preserved cardiac output. Echo was done today and reviewed, EF remains 25-30% with mildly decreased RV systolic function.   She returns for followup of CHF and dyspnea.  Weight is up 2 lbs.  She was unable to wear a Zio patch monitor.  She is not interested in ANTHEM trial.  She remains short of breath after walking about 100 feet or with any moderate exertion.  No chest pain.  No lightheadedness.  No palpitations.   St Jude device interrogation: 90% BiV pacing, stable thoracic impedance.    ECG (personally reviewed): A-BiV paced  Labs (2/21): K 3.9, creatinine 1.11 Labs (3/21): K 4.3,  creatinine 1.17 Labs (8/22): K 3.7, creatinine 1.3 Labs (9/22): BNP 248, K 3.8, creatinine 1.15  PMH: 1. Chronic systolic CHF: Nonischemic cardiomyopathy.  St Jude BiV ICD.  - Echo (8/13): EF 35-40%.  - LHC/RHC (3/16): No significant CAD.  Mean RA 4, PA 22/8, mean PCWP 11, CI 3.4.  - Echo (8/20): EF 25-30%, severe LV dilation, normal RV.  - LHC/RHC (3/21): No significant CAD.  Mean RA 1, PA 20/5, mean PCWP 9, CI 3.7.  - Echo (7/21): EF 35%, moderate LV dilation, normal RV.  - RHC (10/22): mean RA 1, PA 19/6, mean PCWP 6, CI 3.34 - Echo (10/22): EF 25-30%, moderate LV dilation, mildly decreased RV systolic function, normal IVC.  2. 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  3. Hyperlipidemia 4. Hypothyroidism 5. Atrial fibrillation: Paroxysmal.  She has refused anticoagulation.  6. PVCs: 8.2% noted on last device check, did not tolerate increase in ventricular rate to try to suppress. 7. PFTs (3/21): Minimal obstruction  Social History   Socioeconomic History   Marital status: Widowed    Spouse name: Not on file   Number of children: 2   Years of education: Not on file   Highest education level: Not on file  Occupational History   Not on file  Tobacco Use   Smoking status: Former    Packs/day: 0.30    Years: 20.00    Pack years: 6.00  Types: Cigarettes    Start date: 08/03/1949    Quit date: 01/22/1996    Years since quitting: 24.8   Smokeless tobacco: Never  Vaping Use   Vaping Use: Never used  Substance and Sexual Activity   Alcohol use: No    Alcohol/week: 0.0 standard drinks   Drug use: No   Sexual activity: Not Currently    Birth control/protection: Surgical    Comment: hyst  Other Topics Concern   Not on file  Social History Narrative   Her daughter, Terrence Dupont lives with her - takes her to all visits and cooks for her.   Social Determinants of Health   Financial Resource Strain: Medium Risk   Difficulty of Paying Living Expenses: Somewhat hard  Food Insecurity: No Food  Insecurity   Worried About Charity fundraiser in the Last Year: Never true   Ran Out of Food in the Last Year: Never true  Transportation Needs: No Transportation Needs   Lack of Transportation (Medical): No   Lack of Transportation (Non-Medical): No  Physical Activity: Inactive   Days of Exercise per Week: 0 days   Minutes of Exercise per Session: 0 min  Stress: No Stress Concern Present   Feeling of Stress : Only a little  Social Connections: Moderately Isolated   Frequency of Communication with Friends and Family: Three times a week   Frequency of Social Gatherings with Friends and Family: Twice a week   Attends Religious Services: 1 to 4 times per year   Active Member of [employer] or Organizations: No   Attends Archivist Meetings: Never   Marital Status: Widowed  Human resources officer Violence: Not At Risk   Fear of Current or Ex-Partner: No   Emotionally Abused: No   Physically Abused: No   Sexually Abused: No   Family History  Problem Relation Age of Onset   CVA Mother        multiple   Heart Problems Mother        "weak heart"   Heart disease Maternal Grandmother    Heart disease Maternal Grandfather    Cancer Father        Stomach and eshpogus   Diabetes Neg Hx    Hypertension Neg Hx    Coronary artery disease Neg Hx    ROS: All systems reviewed and negative except as per HPI.   Current Outpatient Medications  Medication Sig Dispense Refill   acetaminophen (TYLENOL) 650 MG CR tablet Take 650 mg by mouth every 8 (eight) hours as needed for pain.     apixaban (ELIQUIS) 5 MG TABS tablet Take 1 tablet (5 mg total) by mouth 2 (two) times daily. Need to schedule an appointment for further refills 180 tablet 0   blood glucose meter kit and supplies Dispense based on patient and insurance preference. Use up to four times daily as directed. (FOR ICD-10 E10.9, E11.9). 1 each 0   carvedilol (COREG) 12.5 MG tablet Take 1 tablet (12.5 mg total) by mouth 2 (two) times daily.  180 tablet 3   cetirizine (ZYRTEC) 10 MG tablet Take 10 mg by mouth daily.     clobetasol cream (TEMOVATE) 0.05 % Use 2 x daily for 2 weeks then 2-3 x weekly 45 g 3   furosemide (LASIX) 80 MG tablet Take 80 mg by mouth daily. Take and additional 80 mg if needed     hydrOXYzine (ATARAX/VISTARIL) 25 MG tablet Take 25 mg by mouth 2 (two) times daily.  insulin degludec (TRESIBA) 100 UNIT/ML FlexTouch Pen Inject 12 Units into the skin daily. 6 mL 2   levothyroxine (EUTHYROX) 88 MCG tablet TAKE 1 TABLET BY MOUTH ONCE DAILY BEFORE BREAKFAST *NEED LABWORK* 90 tablet 4   metFORMIN (GLUCOPHAGE-XR) 750 MG 24 hr tablet Take 750 mg by mouth 2 (two) times daily.     nystatin (MYCOSTATIN) 100000 UNIT/ML suspension Take 5 mLs (500,000 Units total) by mouth 4 (four) times daily. 473 mL 0   pantoprazole (PROTONIX) 40 MG tablet Take 40 mg by mouth daily.     spironolactone (ALDACTONE) 25 MG tablet Take 1 tablet (25 mg total) by mouth daily. 90 tablet 3   dapagliflozin propanediol (FARXIGA) 10 MG TABS tablet Take 1 tablet (10 mg total) by mouth daily. 30 tablet 6   Dulaglutide (TRULICITY) 9.76 BH/4.1PF SOPN Inject 0.75 mg into the skin once a week. (Patient not taking: Reported on 11/17/2020)     Vericiguat (VERQUVO) 5 MG TABS Take 1.5 tablets (7.5 mg total) by mouth daily at 12 noon. 45 tablet 6   No current facility-administered medications for this encounter.   BP 114/62   Pulse 72   Wt 80.6 kg (177 lb 9.6 oz)   SpO2 99%   BMI 30.48 kg/m  General: NAD Neck: No JVD, no thyromegaly or thyroid nodule.  Lungs: Clear to auscultation bilaterally with normal respiratory effort. CV: Nondisplaced PMI.  Heart regular S1/S2, no S3/S4, no murmur.  No peripheral edema.  No carotid bruit.  Normal pedal pulses.  Abdomen: Soft, nontender, no hepatosplenomegaly, no distention.  Skin: Intact without lesions or rashes.  Neurologic: Alert and oriented x 3.  Psych: Normal affect. Extremities: No clubbing or cyanosis.   HEENT: Normal.   Assessment/Plan: 1. Chronic systolic CHF: Nonischemic cardiomyopathy by cath in 2016 and again in 3/21.  Low EF back to at least 2013.  Echo in 8/20 with EF 25-30%, severe LV dilation.  RHC in 3/21 showed preserved cardiac output and low filling pressures.  Echo in 7/21 showed EF 35%, moderate LV dilation, normal RV.   St Jude BiV ICD. BiV pacing percentage today is low 90%, possibly due to increased PVCs.  She is still significantly symptomatic, NYHA class III.  However, she is not volume overloaded by exam or by Corvue.  RHC in 10/22 showed normal filling pressures and preserved cardiac output.  Echo today showed EF 25-30% with mild RV dysfunction. I cannot definitively explain her exertional symptoms by cardiac findings.  She also had PFTs in 3/21 that showed minimal obstruction. Dr. Elsworth Soho saw her in 7/21 and thought symptoms were due to deconditioning (no significant COPD).  - She is off digoxin due to elevated level and normal cardiac output on RHC.   - Continue Coreg 12.5 mg bid.   - Continue current Lasix 80 mg daily, BMET/BNP today.  Delene Loll stopped due to lightheadedness and low BP.   - Continue spironolactone 25 mg daily.  - Increase Vericiguat to 7.5 mg daily.  - Continue dapagliflozin 10 mg daily.  - She is not interested in the ANTHEM study.  - Deconditioning may play a major role in her symptoms.  I will refer to cardiac rehab at [facility]. 2. VT/PVCs: She had polymorphic VT in setting of low K/GI illness.  Syncope with appropriate ICD discharge. BiV pacing is lower today at 90%, possibly due to more PVCs.   - She was unable to wear 7 day Zio monitor to assess PVC burden.   3.  Suspect OSA: She would not consider CPAP.  4. Atrial fibrillation: Paroxysmal.  NSR today.    - Continue Eliquis 5 mg bid.   Followup in 3 months.   Loralie Champagne 11/19/2020

## 2020-11-20 DIAGNOSIS — E1165 Type 2 diabetes mellitus with hyperglycemia: Secondary | ICD-10-CM

## 2020-11-20 NOTE — Progress Notes (Signed)
Received notification from [place] regarding approval for TRULICITY 0.75MG /0.5ML. Patient assistance approved from 11/17/20 to 01/20/22.  Medication will ship to pt's home  Phone: [phone]

## 2020-11-22 ENCOUNTER — Other Ambulatory Visit: Payer: Self-pay | Admitting: Family

## 2020-11-22 DIAGNOSIS — G4700 Insomnia, unspecified: Secondary | ICD-10-CM

## 2020-11-22 DIAGNOSIS — F411 Generalized anxiety disorder: Secondary | ICD-10-CM

## 2020-11-27 ENCOUNTER — Ambulatory Visit (INDEPENDENT_AMBULATORY_CARE_PROVIDER_SITE_OTHER): Payer: Medicare Other

## 2020-11-27 DIAGNOSIS — Z95810 Presence of automatic (implantable) cardiac defibrillator: Secondary | ICD-10-CM

## 2020-11-27 DIAGNOSIS — I5022 Chronic systolic (congestive) heart failure: Secondary | ICD-10-CM

## 2020-11-28 NOTE — Progress Notes (Signed)
EPIC Encounter for ICM Monitoring  Patient Name: Melissa Paul is a 75 y.o. female Date: 11/28/2020 Primary Care Physican: Sharion Balloon, FNP Primary Cardiologist: [facility] Electrophysiologist: [facility] Bi-V Pacing: 90%        10/16/2020 Weight: 175 lbs 11/28/2020 Weight: 170 lbs   AT/AF Burden: 3.5% (taking Eliquis)         Spoke with patient and heart failure questions reviewed.  Pt asymptomatic for fluid accumulation and feeling well.    CorVue Thoracic impedance suggesting normal fluid levels.   Prescribed:  Furosemide 80 mg Take 80 mg by mouth daily. Take and additional 80 mg if needed. Spirolactone 25 mg take 1 tablet at bedtime Farxiga 5 mg take 1 tablet daily   Labs: 10/17/2020 Creatinine 1.36, BUN 31, Potassium 5.1, Sodium 143, GFR 53 09/19/2020 Creatinine 1.19, BUN 27, Potassium 5.1, Sodium 139, GFR 62 08/29/2020 Creatinine 1.31, BUN 15, Potassium 3.7, Sodium 136, GFR 42 07/11/2020 Creatinine 1.13, BUN 12, Potassium 4.0, Sodium 139, GFR 50 06/27/2020 Creatinine 1.57, BUN 22, Potassium 4.8, Sodium 134, GFR 34  A complete set of results can be found in Results Review.   Recommendations:   No changes and encouraged to call if experiencing any fluid symptoms.   Follow-up plan: ICM clinic phone appointment on 01/01/2021.   91 day device clinic remote transmission 01/16/2021.    EP/Cardiology Office Visits: 02/21/2021 with Dr. Domenic Polite.  12/04/2020 with Dr [name].  02/23/2021 with Dr Aundra Dubin.   Copy of ICM check sent to Dr. [name].       3 month ICM trend: 11/27/2020.    1 Year ICM trend:       [name], RN 11/28/2020 2:14 PM

## 2020-12-01 ENCOUNTER — Ambulatory Visit (INDEPENDENT_AMBULATORY_CARE_PROVIDER_SITE_OTHER): Payer: Medicare Other | Admitting: Pharmacist

## 2020-12-01 DIAGNOSIS — E1165 Type 2 diabetes mellitus with hyperglycemia: Secondary | ICD-10-CM

## 2020-12-01 DIAGNOSIS — I5022 Chronic systolic (congestive) heart failure: Secondary | ICD-10-CM

## 2020-12-01 NOTE — Progress Notes (Signed)
Chronic Care Management Pharmacy Note  12/01/2020 Name:  Melissa Paul MRN:  [id number] DOB:  [birth year]  Summary: T2DM  Recommendations/Changes made from today's visit:  Diabetes: Uncontrolled-A1C 8.2%; current treatment: METFORMIN, trulicity, farxiga Restarted farxiga (for CHF as well) CONTINUE metformin CONTINUE once weekly trulicity 2.83TD SQ WEEKLY Denies personal and family history of Medullary thyroid cancer (MTC) APPROVED FOR [facility] patient assistance program -- PATIENT TO SET UP SHIPMENT Patient not insulin Current glucose readings: fasting glucose: <180, post prandial glucose: n/a Denies hypoglycemic/hyperglycemic symptoms Discussed meal planning options and Plate method for healthy eating Avoid sugary drinks and desserts Incorporate balanced protein, non starchy veggies, 1 serving of carbohydrate with each meal Increase water intake Increase physical activity as able Current exercise: n/a Recommended trulicity Assessed patient finances. Approved for [facility] cares patient assistance program for trulicity; will re-enroll for next year   Patient Goals/Self-Care Activities Over the next 90 days, patient will:  - take medications as prescribed collaborate with provider on medication access solutions  Follow Up Plan: Telephone follow up appointment with care management team member scheduled for: 1 MONTH  Subjective: Melissa Paul is an 75 y.o. year old female who is a primary patient of Sharion Balloon, FNP.  The CCM team was consulted for assistance with disease management and care coordination needs.    Engaged with patient by telephone for follow up visit in response to provider referral for pharmacy case management and/or care coordination services.   Consent to Services:  The patient was given information about Chronic Care Management services, agreed to services, and gave verbal consent prior to initiation of services.  Please see initial visit note  for detailed documentation.   Patient Care Team: Sharion Balloon, FNP as PCP - General (Family Medicine) Thompson Grayer, MD as PCP - Electrophysiology (Cardiology) Satira Sark, MD as PCP - Cardiology (Cardiology) Lavera Guise, [facility] as Pharmacist (Family Medicine)   Objective:  Lab Results  Component Value Date   CREATININE 1.15 (H) 10/16/2020   CREATININE 1.31 (H) 08/29/2020   CREATININE 1.13 (H) 07/11/2020    Lab Results  Component Value Date   HGBA1C 8.2 (H) 10/31/2020   Last diabetic Eye exam: No results found for: HMDIABEYEEXA  Last diabetic Foot exam: No results found for: HMDIABFOOTEX      Component Value Date/Time   CHOL 156 10/19/2018 1133   TRIG 89 10/19/2018 1133   HDL 45 10/19/2018 1133   CHOLHDL 3.5 10/19/2018 1133   CHOLHDL 3.3 04/11/2007 0600   VLDL 10 04/11/2007 0600   LDLCALC 94 10/19/2018 1133    Hepatic Function Latest Ref Rng & Units 06/27/2020 05/02/2020 04/26/2020  Total Protein 6.0 - 8.5 g/dL 7.2 6.5 6.9  Albumin 3.7 - 4.7 g/dL 4.7 4.2 4.5  AST 0 - 40 IU/L '9 11 10  ' ALT 0 - 32 IU/L '10 10 12  ' Alk Phosphatase 44 - 121 IU/L 78 80 69  Total Bilirubin 0.0 - 1.2 mg/dL 0.5 0.3 0.4  Bilirubin, Direct 0.1 - 0.5 mg/dL - - -    Lab Results  Component Value Date/Time   TSH 2.960 04/26/2020 04:56 PM   TSH 8.680 (H) 10/19/2018 11:33 AM    CBC Latest Ref Rng & Units 10/27/2020 10/27/2020 10/16/2020  WBC 4.0 - 10.5 K/uL - - 7.9  Hemoglobin 12.0 - 15.0 g/dL 10.9(L) 11.2(L) 11.7(L)  Hematocrit 36.0 - 46.0 % 32.0(L) 33.0(L) 37.1  Platelets 150 - 400 K/uL - - 309  No results found for: VD25OH  Clinical ASCVD: No  The 10-year ASCVD risk score (Arnett DK, et al., 2019) is: 35.9%   Values used to calculate the score:     Age: 45 years     Sex: Female     Is Non-Hispanic African American: No     Diabetic: Yes     Tobacco smoker: No     Systolic Blood Pressure: 992 mmHg     Is BP treated: Yes     HDL Cholesterol: 45 mg/dL     Total Cholesterol:  156 mg/dL    Other: (CHADS2VASc if Afib, PHQ9 if depression, MMRC or CAT for COPD, ACT, DEXA)  Social History   Tobacco Use  Smoking Status Former   Packs/day: 0.30   Years: 20.00   Pack years: 6.00   Types: Cigarettes   Start date: 08/03/1949   Quit date: 01/22/1996   Years since quitting: 24.9  Smokeless Tobacco Never   BP Readings from Last 3 Encounters:  11/17/20 114/62  11/15/20 91/60  11/14/20 102/62   Pulse Readings from Last 3 Encounters:  11/17/20 72  11/15/20 69  11/14/20 70   Wt Readings from Last 3 Encounters:  11/17/20 177 lb 9.6 oz (80.6 kg)  11/15/20 175 lb (79.4 kg)  11/14/20 176 lb 9.6 oz (80.1 kg)    Assessment: Review of patient past medical history, allergies, medications, health status, including review of consultants reports, laboratory and other test data, was performed as part of comprehensive evaluation and provision of chronic care management services.   SDOH:  (Social Determinants of Health) assessments and interventions performed:    CCM Care Plan  Allergies  Allergen Reactions   Buspar [Buspirone] Other (See Comments)    Patient states that she became hyper and had lots of itching.   Lexapro [Escitalopram Oxalate] Other (See Comments)    Patient states that she became hyper and lots of itching   Bactrim Rash   Cephalexin Swelling and Rash    Says she is able to take cefuroxime Throat swelling   Chlorhexidine Gluconate Hives and Itching    Hives, itching after using wipes with last procedure   Clindamycin Rash        Contrast Media [Iodinated Diagnostic Agents] Rash   Doxycycline Rash   Latex Rash        Levofloxacin Rash        Nitrofurantoin Monohyd Macro Rash        Penicillins Swelling and Rash    Has patient had a PCN reaction causing immediate rash, facial/tongue/throat swelling, SOB or lightheadedness with hypotension: Yes Has patient had a PCN reaction causing severe rash involving mucus membranes or skin necrosis:  Yes Has patient had a PCN reaction that required hospitalization: No Has patient had a PCN reaction occurring within the last 10 years: No Rash/swelled tongue If all of the above answers are "NO", then may proceed with Cephalosporin use.     Medications Reviewed Today     Reviewed by Lavera Guise, [facility] (Pharmacist) on 12/17/20 at 1255  Med List Status: <None>   Medication Order Taking? Sig Documenting Provider Last Dose Status Informant  acetaminophen (TYLENOL) 650 MG CR tablet [id number]  Take 650 mg by mouth every 8 (eight) hours as needed for pain. [provider]  Active Self  apixaban (ELIQUIS) 5 MG TABS tablet [id number]  Take 1 tablet (5 mg total) by mouth 2 (two) times daily. Need to schedule an appointment for further refills Aundra Dubin,  Elby Showers, MD  Active Self  blood glucose meter kit and supplies [id number]  Dispense based on patient and insurance preference. Use up to four times daily as directed. (FOR ICD-10 E10.9, E11.9). Sharion Balloon, FNP  Active Self  carvedilol (COREG) 12.5 MG tablet [id number]  Take 1 tablet (12.5 mg total) by mouth 2 (two) times daily. [name], MD  Active Self  cetirizine (ZYRTEC) 10 MG tablet [id number]  Take 10 mg by mouth daily. [provider]  Active Self  clobetasol cream (TEMOVATE) 0.05 % [id number]  Use 2 x daily for 2 weeks then 2-3 x weekly Derrek Monaco A, NP  Active Self  dapagliflozin propanediol (FARXIGA) 10 MG TABS tablet [id number]  Take 1 tablet (10 mg total) by mouth daily. [name], MD  Active   Dulaglutide (TRULICITY) 7.56 EP/3.2RJ Bonney Aid [id number] Yes Inject 0.75 mg into the skin once a week. [provider] Taking Active            Med Note Blanca Friend, Omer Jack Dec 17, 2020 12:54 PM) Via [facility] cares patient assistance program  furosemide (LASIX) 80 MG tablet [id number]  Take 80 mg by mouth daily. Take and additional 80 mg if needed [provider]  Active Self  hydrOXYzine  (ATARAX/VISTARIL) 25 MG tablet [id number]  Take 25 mg by mouth 2 (two) times daily. [provider]  Active   hydrOXYzine (VISTARIL) 25 MG capsule [id number]  TAKE 1 CAPSULE BY MOUTH THREE TIMES DAILY Sharion Balloon, FNP  Active     Discontinued 12/17/20 1254 (Change in therapy)            Med Note Lavera Guise   Wed Nov 15, 2020  1:26 PM) CAN'T AFFORD $500  levothyroxine (EUTHYROX) 88 MCG tablet [id number]  TAKE 1 TABLET BY MOUTH ONCE DAILY BEFORE BREAKFAST *NEED LABWORK* Hawks, Christy A, FNP  Active Self  metFORMIN (GLUCOPHAGE-XR) 750 MG 24 hr tablet [id number]  Take 750 mg by mouth 2 (two) times daily. [provider]  Active Self  nystatin (MYCOSTATIN) 100000 UNIT/ML suspension [id number]  Take 5 mLs (500,000 Units total) by mouth 4 (four) times daily. Evelina Dun A, FNP  Active   pantoprazole (PROTONIX) 40 MG tablet [id number]  Take 40 mg by mouth daily. [provider]  Active Self  spironolactone (ALDACTONE) 25 MG tablet [id number]  Take 1 tablet (25 mg total) by mouth daily. Bensimhon, Shaune Pascal, MD  Active   Vericiguat [facility]) 5 MG TABS [id number]  Take 1.5 tablets (7.5 mg total) by mouth daily at 12 noon. [name], MD  Active             Patient Active Problem List   Diagnosis Date Noted   Lichen sclerosus et atrophicus 10/17/2020   Cystocele, midline 09/22/2020   Vulvar irritation 09/22/2020   Vaginal irritation 09/22/2020   Recurrent UTI 04/26/2020   Osteoporosis 11/16/2019   GAD (generalized anxiety disorder) 10/19/2018   Constipation 11/12/2017   Cyst of right kidney 11/12/2017   Type 2 diabetes mellitus with hyperglycemia, without long-term current use of insulin ([place]) 08/11/2017   Acute on chronic systolic ACC/AHA stage C congestive heart failure ([place]) 06/08/2014   Abnormal myocardial perfusion study    Exertional dyspnea - NYHA III CHF 04/11/2014   Abnormal nuclear stress test 04/11/2014   Ventricular tachyarrhythmia 03/17/2014    Atrial fibrillation ([place]) 10/10/2011   Chronic hypotension 03/14/2011   Automatic implantable cardioverter-defibrillator in situ 09/13/2010  MITRAL REGURGITATION 59/97/7414   Chronic systolic heart failure ([place]) 07/14/2009   Hypothyroidism 11/29/2008   Nonischemic cardiomyopathy ([place]) 11/29/2008   LBBB 11/29/2008    Immunization History  Administered Date(s) Administered   Influenza,inj,Quad PF,6+ Mos 11/12/2017   Influenza-Unspecified 11/11/2008, 01/08/2011   Pneumococcal Polysaccharide-23 07/11/2009    Conditions to be addressed/monitored: CHF and DMII  Care Plan : PHARMD MEDICATION MANAGEMENT  Updates made by Lavera Guise, [place] since 12/17/2020 12:00 AM     Problem: DISEASE PROGRESSION PREVENTION      Long-Range Goal: T2DM, CHF   Recent Progress: Not on track  Priority: High  Note:   Current Barriers:  Unable to independently afford treatment regimen Unable to achieve control of T2DM   Pharmacist Clinical Goal(s):  Over the next 90 days, patient will verbalize ability to afford treatment regimen achieve control of T2DM as evidenced by IMPROVED GLYCEMIC CONTROL through collaboration with PharmD and provider.   Interventions: 1:1 collaboration with Sharion Balloon, FNP regarding development and update of comprehensive plan of care as evidenced by provider attestation and co-signature Inter-disciplinary care team collaboration (see longitudinal plan of care) Comprehensive medication review performed; medication list updated in electronic medical record  Diabetes: Uncontrolled-A1C 8.2%; current treatment: METFORMIN, trulicity, farxiga Restarted farxiga (for CHF as well) CONTINUE metformin CONTINUE once weekly trulicity 2.39RV SQ WEEKLY Denies personal and family history of Medullary thyroid cancer (MTC) APPROVED FOR [facility] cares patient assistance program -- PATIENT TO SET UP SHIPMENT Patient not insulin Current glucose readings: fasting glucose: <180, post  prandial glucose: n/a Denies hypoglycemic/hyperglycemic symptoms Discussed meal planning options and Plate method for healthy eating Avoid sugary drinks and desserts Incorporate balanced protein, non starchy veggies, 1 serving of carbohydrate with each meal Increase water intake Increase physical activity as able Current exercise: n/a Recommended trulicity Assessed patient finances. Approved for [facility] cares patient assistance program for trulicity; will re-enroll for next year   Patient Goals/Self-Care Activities Over the next 90 days, patient will:  - take medications as prescribed collaborate with provider on medication access solutions  Follow Up Plan: Telephone follow up appointment with care management team member scheduled for: 1 MONTH      Medication Assistance:  trulicity obtained through [place] cares medication assistance program.  Enrollment ends 01/20/21  Patient's preferred pharmacy is:  [facility] [address], [place] [address] Phone: [phone] Fax: [place], [place] [place] [address] Phone: [phone] Fax: [phone]  Uses pill box? No - n/a Pt endorses 100% compliance  Follow Up:  Patient agrees to Care Plan and Follow-up.  Plan: Telephone follow up appointment with care management team member scheduled for:  1 month    Regina Eck, PharmD, BCPS Clinical Pharmacist, [place]  II Phone [phone]

## 2020-12-04 ENCOUNTER — Encounter: Payer: Medicare Other | Admitting: Internal Medicine

## 2020-12-04 DIAGNOSIS — I5022 Chronic systolic (congestive) heart failure: Secondary | ICD-10-CM

## 2020-12-04 DIAGNOSIS — I428 Other cardiomyopathies: Secondary | ICD-10-CM

## 2020-12-04 DIAGNOSIS — I4901 Ventricular fibrillation: Secondary | ICD-10-CM

## 2020-12-04 DIAGNOSIS — I472 Ventricular tachycardia, unspecified: Secondary | ICD-10-CM

## 2020-12-04 DIAGNOSIS — I480 Paroxysmal atrial fibrillation: Secondary | ICD-10-CM

## 2020-12-07 NOTE — Telephone Encounter (Signed)
Called [employer] to check the status of the patient's [employer] and strength of RX on file. Ended up calling the pharmacy directly at  [phone], had to leave a message for a return call.   Called and spoke with the patient's daughter. They have not received any shipment of Verquvo. I will try and call again. She also stated that she brought the Eliquis renewal paperwork to the office. Will send that in after getting prescriber's portion filled in. Will call Terrence Dupont back once I get more information about the Verquvo.

## 2020-12-17 NOTE — Patient Instructions (Signed)
Visit Information  Thank you for taking time to visit with me today. Please don't hesitate to contact me if I can be of assistance to you before our next scheduled telephone appointment.  Following are the goals we discussed today:   Diabetes: Uncontrolled-A1C 8.2%; current treatment: METFORMIN, trulicity, farxiga Restarted farxiga (for CHF as well) CONTINUE metformin CONTINUE once weekly trulicity 0.75MG  SQ WEEKLY Denies personal and family history of Medullary thyroid cancer (MTC) APPROVED FOR l[facility] cares patient assistance program -- PATIENT TO SET UP SHIPMENT Patient not insulin Current glucose readings: fasting glucose: <180, post prandial glucose: n/a Denies hypoglycemic/hyperglycemic symptoms Discussed meal planning options and Plate method for healthy eating Avoid sugary drinks and desserts Incorporate balanced protein, non starchy veggies, 1 serving of carbohydrate with each meal Increase water intake Increase physical activity as able Current exercise: n/a Recommended trulicity Assessed patient finances. Approved for l[facility] cares patient assistance program for trulicity; will re-enroll for next year   Patient Goals/Self-Care Activities Over the next 90 days, patient will:  - take medications as prescribed collaborate with provider on medication access solutions  Follow Up Plan: Telephone follow up appointment with care management team member scheduled for: 1 MONTH  Please call the care guide team at [phone] if you need to cancel or reschedule your appointment.   The patient verbalized understanding of instructions, educational materials, and care plan provided today and declined offer to receive copy of patient instructions, educational materials, and care plan.   Signature Regina Eck, PharmD, BCPS Clinical Pharmacist, [place]  II Phone [phone]

## 2020-12-18 NOTE — Telephone Encounter (Signed)
Advanced Heart Failure Patient Advocate Encounter  Spoke with [employer] and the representative stated that the patient herself would have to call in, they would not allow me to order for her.   Called and left the patient's daughter a vm with phone number to pharmacy [phone]) and a renewal reminder. Will fill out application once communication is received back.  Charlann Boxer, CPhT

## 2020-12-20 ENCOUNTER — Telehealth (HOSPITAL_COMMUNITY): Payer: Self-pay | Admitting: Pharmacy Technician

## 2020-12-20 NOTE — Telephone Encounter (Signed)
Advanced Heart Failure Patient Advocate Encounter  Sent in [employer] renewal application via fax, 02/63.  Will follow up.

## 2020-12-25 ENCOUNTER — Encounter: Payer: Medicare Other | Admitting: Internal Medicine

## 2020-12-27 ENCOUNTER — Telehealth (HOSPITAL_COMMUNITY): Payer: Self-pay | Admitting: Pharmacy Technician

## 2020-12-27 ENCOUNTER — Other Ambulatory Visit: Payer: Self-pay | Admitting: Family

## 2020-12-27 DIAGNOSIS — F411 Generalized anxiety disorder: Secondary | ICD-10-CM

## 2020-12-27 DIAGNOSIS — G4700 Insomnia, unspecified: Secondary | ICD-10-CM

## 2020-12-27 NOTE — Telephone Encounter (Signed)
Advanced Heart Failure Patient Advocate Encounter  Patient's daughter called and left message requesting assistance renewal with [employer] for Verquvo. Called and spoke with her. She has the application and will mail it in.  I inquired if they ever got anyone on the phone to send the [facility] to begin with. Stated that they have never received a shipment. If [employer] will not allow me to get a refill sent on the patient's behalf and if the patient herself cannot get anyone on the phone or to return their call I do not see a point in renewing this assistance. The patient's daughter is going to try and call them again and get back in touch with me. The phone number to the pharmacy is [phone].  If all else fails, we can always put the patient on the PAN HF fund wait list as well.

## 2020-12-28 NOTE — Telephone Encounter (Signed)
Advanced Heart Failure Patient Advocate Encounter  The patient's daughter called back stating that she was able to get someone on the phone this time. Expected delivery of Melissa Paul is set for tomorrow. The pharmacy advised that the medication was shipped and delivered on 10/15 but the patient's daughter stated taht they never received it.  They will have to sign for this delivery considering the past issues. She lives in an apartment building with no office so I think that the signature upon delivery is a good idea considering the past issues they have had. Will be on the lookout for renewal application.

## 2021-01-01 ENCOUNTER — Ambulatory Visit (INDEPENDENT_AMBULATORY_CARE_PROVIDER_SITE_OTHER): Payer: Medicare Other

## 2021-01-01 DIAGNOSIS — Z95810 Presence of automatic (implantable) cardiac defibrillator: Secondary | ICD-10-CM

## 2021-01-01 DIAGNOSIS — I5022 Chronic systolic (congestive) heart failure: Secondary | ICD-10-CM

## 2021-01-05 NOTE — Progress Notes (Signed)
EPIC Encounter for ICM Monitoring  Patient Name: Melissa Paul is a 75 y.o. female Date: 01/05/2021 Primary Care Physican: Sharion Balloon, FNP Primary Cardiologist: [place] Electrophysiologist: Allred Bi-V Pacing: 91%        10/16/2020 Weight: 175 lbs 11/28/2020 Weight: 170 lbs 01/05/2021 Weight: 172 lbs   AT/AF Burden: 1.9% (taking Eliquis)         Spoke with patient and heart failure questions reviewed.  Pt asymptomatic for fluid accumulation and feeling well.  She drinks a lot of water to help keep kidneys flushed and prevent UTI.      CorVue Thoracic impedance suggesting normal fluid levels.   Prescribed:  Furosemide 80 mg Take 80 mg by mouth daily. Take and additional 80 mg if needed. Spirolactone 25 mg take 1 tablet at bedtime Farxiga 5 mg take 1 tablet daily   Labs: 10/17/2020 Creatinine 1.36, BUN 31, Potassium 5.1, Sodium 143, GFR 53 09/19/2020 Creatinine 1.19, BUN 27, Potassium 5.1, Sodium 139, GFR 62 08/29/2020 Creatinine 1.31, BUN 15, Potassium 3.7, Sodium 136, GFR 42 07/11/2020 Creatinine 1.13, BUN 12, Potassium 4.0, Sodium 139, GFR 50 06/27/2020 Creatinine 1.57, BUN 22, Potassium 4.8, Sodium 134, GFR 34  A complete set of results can be found in Results Review.   Recommendations:   No changes and encouraged to call if experiencing any fluid symptoms.   Follow-up plan: ICM clinic phone appointment on 02/05/2021.   91 day device clinic remote transmission 01/16/2021.    EP/Cardiology Office Visits: 02/21/2021 with Dr. Domenic Polite.  12/302022 with Dr [name].  02/23/2021 with Dr Aundra Dubin.   Copy of ICM check sent to Dr. [name].        3 month ICM trend: 01/01/2021.    12-14 Month ICM trend:       [name], RN 01/05/2021 3:52 PM

## 2021-01-08 ENCOUNTER — Encounter (HOSPITAL_COMMUNITY): Payer: Self-pay

## 2021-01-08 ENCOUNTER — Other Ambulatory Visit (HOSPITAL_COMMUNITY): Payer: Self-pay

## 2021-01-08 MED ORDER — VERQUVO 5 MG PO TABS: 7.5000 mg | ORAL_TABLET | Freq: Every day | ORAL | 6 refills | Status: DC

## 2021-01-09 NOTE — Telephone Encounter (Signed)
Advanced Heart Failure Patient Advocate Encounter  Received communication from [facility] that the patient's portion of the application was received but the provider portion was still needed. Sent that in along with physical RX for [place].

## 2021-01-10 ENCOUNTER — Telehealth: Payer: Medicare Other

## 2021-01-16 ENCOUNTER — Ambulatory Visit (INDEPENDENT_AMBULATORY_CARE_PROVIDER_SITE_OTHER): Payer: Medicare Other

## 2021-01-16 DIAGNOSIS — I5022 Chronic systolic (congestive) heart failure: Secondary | ICD-10-CM | POA: Diagnosis not present

## 2021-01-16 DIAGNOSIS — I428 Other cardiomyopathies: Secondary | ICD-10-CM

## 2021-01-16 LAB — CUP PACEART REMOTE DEVICE CHECK
Battery Remaining Longevity: 25 mo
Battery Remaining Percentage: 33 %
Battery Voltage: 2.89 V
Brady Statistic AP VP Percent: 85 %
Brady Statistic AP VS Percent: 2.1 %
Brady Statistic AS VP Percent: 6.4 %
Brady Statistic AS VS Percent: 1 %
Brady Statistic RA Percent Paced: 78 %
Date Time Interrogation Session: 20221226020018
HighPow Impedance: 80 Ohm
HighPow Impedance: 80 Ohm
Implantable Lead Implant Date: 20110912
Implantable Lead Implant Date: 20110912
Implantable Lead Implant Date: 20120710
Implantable Lead Location: 753858
Implantable Lead Location: 753859
Implantable Lead Location: 753860
Implantable Lead Model: 7122
Implantable Pulse Generator Implant Date: 20180621
Lead Channel Impedance Value: 330 Ohm
Lead Channel Impedance Value: 480 Ohm
Lead Channel Impedance Value: 530 Ohm
Lead Channel Pacing Threshold Amplitude: 0.5 V
Lead Channel Pacing Threshold Amplitude: 0.75 V
Lead Channel Pacing Threshold Amplitude: 0.875 V
Lead Channel Pacing Threshold Pulse Width: 0.5 ms
Lead Channel Pacing Threshold Pulse Width: 0.5 ms
Lead Channel Pacing Threshold Pulse Width: 0.5 ms
Lead Channel Sensing Intrinsic Amplitude: 11.8 mV
Lead Channel Sensing Intrinsic Amplitude: 2.4 mV
Lead Channel Setting Pacing Amplitude: 2 V
Lead Channel Setting Pacing Amplitude: 2 V
Lead Channel Setting Pacing Amplitude: 2 V
Lead Channel Setting Pacing Pulse Width: 0.5 ms
Lead Channel Setting Pacing Pulse Width: 0.5 ms
Lead Channel Setting Sensing Sensitivity: 0.5 mV
Pulse Gen Serial Number: 9761374

## 2021-01-19 ENCOUNTER — Encounter: Payer: Medicare Other | Admitting: Internal Medicine

## 2021-01-24 ENCOUNTER — Ambulatory Visit (INDEPENDENT_AMBULATORY_CARE_PROVIDER_SITE_OTHER): Payer: Medicare Other | Admitting: Student

## 2021-01-24 ENCOUNTER — Encounter: Payer: Self-pay | Admitting: Student

## 2021-01-24 ENCOUNTER — Encounter (INDEPENDENT_AMBULATORY_CARE_PROVIDER_SITE_OTHER): Payer: Self-pay

## 2021-01-24 ENCOUNTER — Other Ambulatory Visit: Payer: Self-pay

## 2021-01-24 ENCOUNTER — Telehealth: Payer: Self-pay

## 2021-01-24 VITALS — BP 112/58 | HR 72 | Ht 62.5 in | Wt 180.0 lb

## 2021-01-24 DIAGNOSIS — I5022 Chronic systolic (congestive) heart failure: Secondary | ICD-10-CM | POA: Diagnosis not present

## 2021-01-24 DIAGNOSIS — Z95810 Presence of automatic (implantable) cardiac defibrillator: Secondary | ICD-10-CM

## 2021-01-24 DIAGNOSIS — I493 Ventricular premature depolarization: Secondary | ICD-10-CM | POA: Diagnosis not present

## 2021-01-24 DIAGNOSIS — I480 Paroxysmal atrial fibrillation: Secondary | ICD-10-CM

## 2021-01-24 LAB — CUP PACEART INCLINIC DEVICE CHECK
Battery Remaining Longevity: 24 mo
Brady Statistic RA Percent Paced: 78 %
Brady Statistic RV Percent Paced: 91 %
Date Time Interrogation Session: 20230104111014
HighPow Impedance: 70.875
Implantable Lead Implant Date: 20110912
Implantable Lead Implant Date: 20110912
Implantable Lead Implant Date: 20120710
Implantable Lead Location: 753858
Implantable Lead Location: 753859
Implantable Lead Location: 753860
Implantable Lead Model: 7122
Implantable Pulse Generator Implant Date: 20180621
Lead Channel Impedance Value: 312.5 Ohm
Lead Channel Impedance Value: 475 Ohm
Lead Channel Impedance Value: 537.5 Ohm
Lead Channel Pacing Threshold Amplitude: 0.5 V
Lead Channel Pacing Threshold Amplitude: 0.5 V
Lead Channel Pacing Threshold Amplitude: 0.75 V
Lead Channel Pacing Threshold Amplitude: 0.75 V
Lead Channel Pacing Threshold Amplitude: 0.75 V
Lead Channel Pacing Threshold Pulse Width: 0.5 ms
Lead Channel Pacing Threshold Pulse Width: 0.5 ms
Lead Channel Pacing Threshold Pulse Width: 0.5 ms
Lead Channel Pacing Threshold Pulse Width: 0.5 ms
Lead Channel Pacing Threshold Pulse Width: 0.5 ms
Lead Channel Sensing Intrinsic Amplitude: 12 mV
Lead Channel Sensing Intrinsic Amplitude: 3.1 mV
Lead Channel Setting Pacing Amplitude: 2 V
Lead Channel Setting Pacing Amplitude: 2 V
Lead Channel Setting Pacing Amplitude: 2 V
Lead Channel Setting Pacing Pulse Width: 0.5 ms
Lead Channel Setting Pacing Pulse Width: 0.5 ms
Lead Channel Setting Sensing Sensitivity: 0.5 mV
Pulse Gen Serial Number: 9761374

## 2021-01-24 MED ORDER — DILTIAZEM HCL ER COATED BEADS 120 MG PO CP24: 120.0000 mg | ORAL_CAPSULE | Freq: Every day | ORAL | 3 refills | Status: DC

## 2021-01-24 NOTE — Patient Instructions (Signed)
Medication Instructions:  Your physician recommends that you continue on your current medications as directed. Please refer to the Current Medication list given to you today.  *If you need a refill on your cardiac medications before your next appointment, please call your pharmacy*   Lab Work: None If you have labs (blood work) drawn today and your tests are completely normal, you will receive your results only by: MyChart Message (if you have MyChart) OR A paper copy in the mail If you have any lab test that is abnormal or we need to change your treatment, we will call you to review the results.   Follow-Up: At [facility], you and your health needs are our priority.  As part of our continuing mission to provide you with exceptional heart care, we have created designated Provider Care Teams.  These Care Teams include your primary Cardiologist (physician) and Advanced Practice Providers (APPs -  Physician Assistants and Nurse Practitioners) who all work together to provide you with the care you need, when you need it.  We recommend signing up for the patient portal called "MyChart".  Sign up information is provided on this After Visit Summary.  MyChart is used to connect with patients for Virtual Visits (Telemedicine).  Patients are able to view lab/test results, encounter notes, upcoming appointments, etc.  Non-urgent messages can be sent to your provider as well.   To learn more about what you can do with MyChart, go to [email or web address].    Your next appointment:   6 month(s)  The format for your next appointment:   In Person  Provider:   You may see James Allred, MD or one of the following Advanced Practice Providers on your designated Care Team:   Renee Ursuy, PA-C Michael "Andy" Tillery, PA-C  

## 2021-01-24 NOTE — Telephone Encounter (Signed)
I sent in Diltiazem 120mg  to this pt's pharmacy by mistake. I deleted it from her chart. I immediately called her pharmacy ([facility] in [place]) and had them cancel the RX. (I spoke with Jenny Reichmann and he assured me that it had been cancelled). The pt is aware of this and I told her to be cautious when she picks up her prescriptions to make sure they don't give her this one.  She was appreciative for me telling her.

## 2021-01-24 NOTE — Progress Notes (Signed)
Electrophysiology Office Note Date: 01/24/2021  ID:  Melissa Paul, DOB [birth year], MRN [id number]  PCP: Sharion Balloon, FNP Primary Cardiologist: Rozann Lesches, MD Electrophysiologist: Thompson Grayer, MD   CC: Routine ICD follow-up  Melissa Paul is a 76 y.o. female seen today for Thompson Grayer, MD for routine electrophysiology followup.  Since last being seen in our clinic the patient reports doing about the same. Has chronic SOB with more than mild exertion. Has chronic dizziness/balance issues which also limit mobility. She has discussed ANTHEM and Barostim with family and is not interested in any additional procedures at this time.   Device History: St. Jude BiV ICD implanted 09/2009, gen change 06/2016 for chronic systolic CHF History of appropriate therapy: Yes History of AAD therapy: No   Past Medical History:  Diagnosis Date   Anxiety    Chronic systolic heart failure (HCC)    Constipation    Cyst of right kidney 11/12/2017   Glucose intolerance (pre-diabetes)    Hyperlipemia    Hypothyroidism    LBBB (left bundle branch block)    Mitral regurgitation    Mild to Moderate   Nonischemic cardiomyopathy (HCC)    LVEF 35-40% by echo 8/13   Paroxysmal atrial fibrillation (HCC)    Brief episodes by device interrogation   UTI (urinary tract infection)    Recurrent   Ventricular tachycardia    ICD therapy for VT   Past Surgical History:  Procedure Laterality Date   APPENDECTOMY     BIV ICD GENERATOR CHANGEOUT N/A 07/11/2016   Procedure: BiV ICD Generator Changeout;  Surgeon: Thompson Grayer, MD;  Location: [place] [facility];  Service: Cardiovascular;  Laterality: N/A;   CARDIAC DEFIBRILLATOR PLACEMENT  09/2009   St. Jude BiV ICD by Dr. Sandria Manly class III   CHOLECYSTECTOMY     has one ovary left     KNEE ARTHROSCOPY     Left   LEFT AND RIGHT HEART CATHETERIZATION WITH CORONARY ANGIOGRAM N/A 04/12/2014   Procedure: LEFT AND RIGHT HEART CATHETERIZATION WITH  CORONARY ANGIOGRAM;  Surgeon: Leonie Man, MD;  Location: [facility];  Service: Cardiovascular;  Laterality: N/A;   Left breast biopsy x 2     no maliganancy   PACEMAKER IMPLANT     RIGHT HEART CATH N/A 10/27/2020   Procedure: RIGHT HEART CATH;  Surgeon: [name], MD;  Location: [place] [facility];  Service: Cardiovascular;  Laterality: N/A;   RIGHT/LEFT HEART CATH AND CORONARY ANGIOGRAPHY N/A 03/26/2019   Procedure: RIGHT/LEFT HEART CATH AND CORONARY ANGIOGRAPHY;  Surgeon: [name], MD;  Location: [place] [facility];  Service: Cardiovascular;  Laterality: N/A;   TOTAL ABDOMINAL HYSTERECTOMY     (R) ovary removed    Current Outpatient Medications  Medication Sig Dispense Refill   acetaminophen (TYLENOL) 650 MG CR tablet Take 650 mg by mouth every 8 (eight) hours as needed for pain.     apixaban (ELIQUIS) 5 MG TABS tablet Take 1 tablet (5 mg total) by mouth 2 (two) times daily. Need to schedule an appointment for further refills 180 tablet 0   blood glucose meter kit and supplies Dispense based on patient and insurance preference. Use up to four times daily as directed. (FOR ICD-10 E10.9, E11.9). 1 each 0   carvedilol (COREG) 12.5 MG tablet Take 1 tablet (12.5 mg total) by mouth 2 (two) times daily. 180 tablet 3   cetirizine (ZYRTEC) 10 MG tablet Take 10 mg by mouth  daily.     clobetasol cream (TEMOVATE) 0.05 % Use 2 x daily for 2 weeks then 2-3 x weekly 45 g 3   dapagliflozin propanediol (FARXIGA) 10 MG TABS tablet Take 1 tablet (10 mg total) by mouth daily. 30 tablet 6   Dulaglutide (TRULICITY) 8.76 OT/1.5BW SOPN Inject 0.75 mg into the skin once a week.     furosemide (LASIX) 80 MG tablet Take 80 mg by mouth daily. Take and additional 80 mg if needed     hydrOXYzine (ATARAX) 25 MG tablet Take 25 mg by mouth 2 (two) times daily.     hydrOXYzine (VISTARIL) 25 MG capsule TAKE 1 CAPSULE BY MOUTH THREE TIMES DAILY 90 capsule 0   levothyroxine (EUTHYROX) 88 MCG tablet TAKE 1  TABLET BY MOUTH ONCE DAILY BEFORE BREAKFAST *NEED LABWORK* 90 tablet 4   metFORMIN (GLUCOPHAGE-XR) 750 MG 24 hr tablet Take 750 mg by mouth 2 (two) times daily.     nystatin (MYCOSTATIN) 100000 UNIT/ML suspension Take 5 mLs (500,000 Units total) by mouth 4 (four) times daily. 473 mL 0   pantoprazole (PROTONIX) 40 MG tablet Take 40 mg by mouth daily.     spironolactone (ALDACTONE) 25 MG tablet Take 1 tablet (25 mg total) by mouth daily. 90 tablet 3   Vericiguat (VERQUVO) 5 MG TABS Take 1.5 tablets (7.5 mg total) by mouth daily at 12 noon. 45 tablet 6   No current facility-administered medications for this visit.    Allergies:   Buspar [buspirone], Lexapro [escitalopram oxalate], Bactrim, Cephalexin, Chlorhexidine gluconate, Clindamycin, Contrast media [iodinated contrast media], Doxycycline, Latex, Levofloxacin, Nitrofurantoin monohyd macro, and Penicillins   Social History: Social History   Socioeconomic History   Marital status: Widowed    Spouse name: Not on file   Number of children: 2   Years of education: Not on file   Highest education level: Not on file  Occupational History   Not on file  Tobacco Use   Smoking status: Former    Packs/day: 0.30    Years: 20.00    Pack years: 6.00    Types: Cigarettes    Start date: 08/03/1949    Quit date: 01/22/1996    Years since quitting: 25.0   Smokeless tobacco: Never  Vaping Use   Vaping Use: Never used  Substance and Sexual Activity   Alcohol use: No    Alcohol/week: 0.0 standard drinks   Drug use: No   Sexual activity: Not Currently    Birth control/protection: Surgical    Comment: hyst  Other Topics Concern   Not on file  Social History Narrative   Her daughter, Terrence Dupont lives with her - takes her to all visits and cooks for her.   Social Determinants of Health   Financial Resource Strain: Medium Risk   Difficulty of Paying Living Expenses: Somewhat hard  Food Insecurity: No Food Insecurity   Worried About Sales executive in the Last Year: Never true   Ran Out of Food in the Last Year: Never true  Transportation Needs: No Transportation Needs   Lack of Transportation (Medical): No   Lack of Transportation (Non-Medical): No  Physical Activity: Inactive   Days of Exercise per Week: 0 days   Minutes of Exercise per Session: 0 min  Stress: No Stress Concern Present   Feeling of Stress : Only a little  Social Connections: Moderately Isolated   Frequency of Communication with Friends and Family: Three times a week   Frequency of Social Gatherings  with Friends and Family: Twice a week   Attends Religious Services: 1 to 4 times per year   Active Member of Clubs or Organizations: No   Attends Archivist Meetings: Never   Marital Status: Widowed  Human resources officer Violence: Not At Risk   Fear of Current or Ex-Partner: No   Emotionally Abused: No   Physically Abused: No   Sexually Abused: No    Family History: Family History  Problem Relation Age of Onset   CVA Mother        multiple   Heart Problems Mother        "weak heart"   Heart disease Maternal Grandmother    Heart disease Maternal Grandfather    Cancer Father        Stomach and eshpogus   Diabetes Neg Hx    Hypertension Neg Hx    Coronary artery disease Neg Hx     Review of Systems: All other systems reviewed and are otherwise negative except as noted above.   Physical Exam: Vitals:   01/24/21 1034  BP: (!) 112/58  Pulse: 72  SpO2: 99%  Weight: 180 lb (81.6 kg)  Height: 5' 2.5" (1.588 m)     GEN- The patient is well appearing, alert and oriented x 3 today.   HEENT: normocephalic, atraumatic; sclera clear, conjunctiva pink; hearing intact; oropharynx clear; neck supple, no JVP Lymph- no cervical lymphadenopathy Lungs- Clear to ausculation bilaterally, normal work of breathing.  No wheezes, rales, rhonchi Heart- Regular rate and rhythm, no murmurs, rubs or gallops, PMI not laterally displaced GI- soft, non-tender,  non-distended, bowel sounds present, no hepatosplenomegaly Extremities- no clubbing or cyanosis. No edema; DP/PT/radial pulses 2+ bilaterally MS- no significant deformity or atrophy Skin- warm and dry, no rash or lesion; ICD pocket well healed Psych- euthymic mood, full affect Neuro- strength and sensation are intact  ICD interrogation- reviewed in detail today,  See PACEART report  EKG:  EKG is not ordered today. EKG 11/17/2020 showed A sensed V pacing at 120 bpm  Recent Labs: 04/26/2020: TSH 2.960 06/27/2020: ALT 10 10/16/2020: B Natriuretic Peptide 247.9; BUN 13; Creatinine, Ser 1.15; Platelets 309 10/27/2020: Hemoglobin 10.9; Potassium 3.2; Sodium 141   Wt Readings from Last 3 Encounters:  01/24/21 180 lb (81.6 kg)  11/17/20 177 lb 9.6 oz (80.6 kg)  11/15/20 175 lb (79.4 kg)     Other studies Reviewed: Additional studies/ records that were reviewed today include: Previous EP office notes.   Assessment and Plan:  1.  Chronic systolic dysfunction s/p St. Jude CRT-D  euvolemic today Stable on an appropriate medical regimen Normal ICD function See Pace Art report No changes today PFTs relatively normal 04/20/19. No significant obstruction. Cardiac rehab referal placed  Have previously discussed ANTHEM and she has refused.  She is similarly not interested in Barostim Known atrial noise going back as far 2012. Has not thus far affected function. Is reproducible with pushing flat palms in towards each other   2. 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 Afib Well controlled currently She is willing to try Frostproof, and is prescribed Eliquis to start once the "infection in her mouth" clears up. CHA2DS2VASC of at least 6     4. PVCs Previously discussed (and refused) PVC ablation BiV pacing 91% Could consider amiodarone if needed in the future to suppress PVCs   5. Snoring Refuses sleep study  Current medicines are reviewed at length with the patient today.    Disposition:  Follow  up with EP APP in 6 months   Signed, Annamaria Helling  01/24/2021 10:43 AM  [facility] [address] [place] [address] [phone] (office) [phone] (fax)

## 2021-01-24 NOTE — Progress Notes (Signed)
Remote ICD transmission.   

## 2021-02-01 ENCOUNTER — Other Ambulatory Visit: Payer: Self-pay

## 2021-02-01 ENCOUNTER — Ambulatory Visit (INDEPENDENT_AMBULATORY_CARE_PROVIDER_SITE_OTHER): Payer: Medicare Other | Admitting: Adult Health

## 2021-02-01 ENCOUNTER — Encounter: Payer: Self-pay | Admitting: Adult Health

## 2021-02-01 VITALS — BP 103/61 | HR 72 | Ht 62.5 in | Wt 182.5 lb

## 2021-02-01 DIAGNOSIS — Z4689 Encounter for fitting and adjustment of other specified devices: Secondary | ICD-10-CM

## 2021-02-01 DIAGNOSIS — L900 Lichen sclerosus et atrophicus: Secondary | ICD-10-CM | POA: Diagnosis not present

## 2021-02-01 DIAGNOSIS — N993 Prolapse of vaginal vault after hysterectomy: Secondary | ICD-10-CM | POA: Diagnosis not present

## 2021-02-01 DIAGNOSIS — N8111 Cystocele, midline: Secondary | ICD-10-CM

## 2021-02-01 DIAGNOSIS — R102 Pelvic and perineal pain unspecified side: Secondary | ICD-10-CM

## 2021-02-01 NOTE — Progress Notes (Signed)
°  Subjective:     Patient ID: Melissa Paul, female   DOB: 20-Apr-1945, 76 y.o.   MRN: [id number]  HPI [name] is a 76 year old white female, widowed, sp hysterectomy in complaining of pelvic pressure, when peeing feels like something falling out, hurts to sit at times, and stand. Seems worse last week or so. PCP is [name] NP   Review of Systems +pelvic pressure Feels like something falling out    Reviewed past medical,surgical, social and family history. Reviewed medications and allergies.  Objective:   Physical Exam BP 103/61 (BP Location: Left Arm, Patient Position: Sitting, Cuff Size: Normal)    Pulse 72    Ht 5' 2.5" (1.588 m)    Wt 182 lb 8 oz (82.8 kg)    BMI 32.85 kg/m     Skin warm and dry.Pelvic: external genitalia is normal in appearance no lesions, vagina: pale, +cystocele and vaginal vault prolapse,Dr Elonda Husky in for co exam and he fitted her with #4 milex ring with support pessary, urethra has no lesions or masses noted, cervix and uterus are absent,adnexa: no masses or tenderness noted. Bladder is non tender and no masses felt.  She says it feels better she thinks.  Fall risk is high  Upstream - 02/01/21 1126       Pregnancy Intention Screening   Does the patient want to become pregnant in the next year? N/A    Does the patient's partner want to become pregnant in the next year? N/A    Would the patient like to discuss contraceptive options today? N/A      Contraception Wrap Up   Current Method Female Sterilization   hyst   End Method Female Sterilization   hyst   Contraception Counseling Provided No            Examination chaperoned by Levy Pupa LPN  Assessment:     1. Vaginal vault prolapse after hysterectomy  2. Cystocele, midline  3. Lichen sclerosus et atrophicus. Much better Continue temovate   4. Encounter for fitting and adjustment of pessary  5. Pelvic pressure in female    Plan:     Follow up in 4 weeks for pessary check

## 2021-02-05 ENCOUNTER — Ambulatory Visit (INDEPENDENT_AMBULATORY_CARE_PROVIDER_SITE_OTHER): Payer: Medicare Other

## 2021-02-05 DIAGNOSIS — I5022 Chronic systolic (congestive) heart failure: Secondary | ICD-10-CM | POA: Diagnosis not present

## 2021-02-05 DIAGNOSIS — Z95810 Presence of automatic (implantable) cardiac defibrillator: Secondary | ICD-10-CM | POA: Diagnosis not present

## 2021-02-07 NOTE — Progress Notes (Signed)
EPIC Encounter for ICM Monitoring  Patient Name: COSIMA PRENTISS is a 76 y.o. female Date: 02/07/2021 Primary Care Physican: Sharion Balloon, FNP Primary Cardiologist: [place] Electrophysiologist: Allred Bi-V Pacing: 93%        02/07/2021 Weight: 172 lbs   AT/AF Burden: 3.3% (taking Eliquis)         Spoke with patient and heart failure questions reviewed.  Pt asymptomatic for fluid accumulation and feeling well.  She could tell she had fluid during decreased impedance.      CorVue Thoracic impedance suggesting normal fluid levels.   Prescribed:  Furosemide 80 mg Take 80 mg by mouth daily. Take and additional 80 mg if needed. Spirolactone 25 mg take 1 tablet at bedtime Farxiga 5 mg take 1 tablet daily   Labs: 10/17/2020 Creatinine 1.36, BUN 31, Potassium 5.1, Sodium 143, GFR 53 09/19/2020 Creatinine 1.19, BUN 27, Potassium 5.1, Sodium 139, GFR 62 08/29/2020 Creatinine 1.31, BUN 15, Potassium 3.7, Sodium 136, GFR 42 07/11/2020 Creatinine 1.13, BUN 12, Potassium 4.0, Sodium 139, GFR 50 06/27/2020 Creatinine 1.57, BUN 22, Potassium 4.8, Sodium 134, GFR 34  A complete set of results can be found in Results Review.   Recommendations:   No changes and encouraged to call if experiencing any fluid symptoms.   Follow-up plan: ICM clinic phone appointment on 03/12/2021.   91 day device clinic remote transmission 04/16/2021.    EP/Cardiology Office Visits: 02/21/2021 with Dr. Domenic Polite.   02/23/2021 with Dr Aundra Dubin.   Copy of ICM check sent to Dr. [name].       3 month ICM trend: 02/05/2021.    12-14 Month ICM trend:     [name], RN 02/07/2021 2:16 PM

## 2021-02-08 ENCOUNTER — Ambulatory Visit: Payer: Medicare Other | Admitting: Adult Health

## 2021-02-09 NOTE — Telephone Encounter (Signed)
Advanced Heart Failure Patient Advocate Encounter  Called and left the patient's daughter a message regarding approval.  Charlann Boxer, CPhT

## 2021-02-13 ENCOUNTER — Ambulatory Visit: Payer: Medicare Other | Admitting: Adult Health

## 2021-02-16 ENCOUNTER — Encounter: Payer: Self-pay | Admitting: Adult Health

## 2021-02-16 ENCOUNTER — Ambulatory Visit (INDEPENDENT_AMBULATORY_CARE_PROVIDER_SITE_OTHER): Payer: Medicare Other | Admitting: Adult Health

## 2021-02-16 ENCOUNTER — Other Ambulatory Visit: Payer: Self-pay

## 2021-02-16 VITALS — BP 88/54 | HR 69 | Ht 62.5 in | Wt 179.0 lb

## 2021-02-16 DIAGNOSIS — N993 Prolapse of vaginal vault after hysterectomy: Secondary | ICD-10-CM | POA: Diagnosis not present

## 2021-02-16 DIAGNOSIS — N8111 Cystocele, midline: Secondary | ICD-10-CM

## 2021-02-16 DIAGNOSIS — Z4689 Encounter for fitting and adjustment of other specified devices: Secondary | ICD-10-CM

## 2021-02-16 MED ORDER — CLOBETASOL PROPIONATE 0.05 % EX OINT: 1.0000 "application " | TOPICAL_OINTMENT | Freq: Two times a day (BID) | CUTANEOUS | 3 refills | Status: DC

## 2021-02-16 NOTE — Progress Notes (Signed)
°  Subjective:     Patient ID: TAMIA DIAL, female   DOB: [birth year], 76 y.o.   MRN: [id number]  HPI Kamaree is a 76 year old white female, widowed, sp hysterectomy in saying pessary came out with BM, did feel better when in, has pressure. PCP is C hawks NP  Review of Systems +pelvic pressure Reviewed past medical,surgical, social and family history. Reviewed medications and allergies.     Objective:   Physical Exam BP (!) 88/54 (BP Location: Left Arm, Patient Position: Sitting, Cuff Size: Large)    Pulse 69    Ht 5' 2.5" (1.588 m)    Wt 179 lb (81.2 kg)    BMI 32.22 kg/m     Skin warm and dry.Pelvic: external genitalia is normal in appearance no lesions, vagina: +cystocele and vaginal vault prolapse, placed # 5 Milex ring with support in, urethra has no lesions or masses noted, cervix and uterus is absent,adnexa: no masses or tenderness noted. Bladder is non tender and no masses felt. She is dizzy has not had breakfast, gave some fruit juice and assisted with dressing. She has walker and daughter is driving.   Examination chaperoned by Marcelino Scot RN.  Assessment:     1. Pessary maintenance Refitted with Milex #5 ring with support  2. Cystocele, midline  3. Vaginal vault prolapse after hysterectomy     Plan:     Follow up in 2 weeks with me for pessary  check

## 2021-02-19 ENCOUNTER — Other Ambulatory Visit: Payer: Self-pay | Admitting: Family

## 2021-02-20 ENCOUNTER — Other Ambulatory Visit: Payer: Self-pay | Admitting: Family

## 2021-02-20 NOTE — Progress Notes (Signed)
Cardiology Office Note  Date: 02/21/2021   ID: [name], DOB [birth year], MRN [id number]  PCP:  Sharion Balloon, FNP  Cardiologist:  Rozann Lesches, MD Electrophysiologist:  Thompson Grayer, MD   Chief Complaint  Patient presents with   Cardiac follow-up    History of Present Illness: Melissa Paul is a 76 y.o. female last seen in October 2022.  She is here today with her daughter for follow-up visit.  No change in status, NYHA class II-III dyspnea as before.  Weight is stable and she reports compliance with current medical regimen as detailed below, no obvious intolerances.  Still tends to run a low normal to low blood pressure.  She does state that she has an occasional sense of palpitations, has had no device shocks or syncope.  She had a recent follow-up visit with EP, history of St. Jude biventricular ICD.  Recent device interrogation indicated 91% biventricular pacing, estimated 2-year generator longevity.  We did discuss getting follow-up lab work.  She reports no spontaneous bleeding problems on Eliquis.  Past Medical History:  Diagnosis Date   Anxiety    Chronic systolic heart failure (HCC)    Constipation    Cyst of right kidney 11/12/2017   Glucose intolerance (pre-diabetes)    Hyperlipemia    Hypothyroidism    LBBB (left bundle branch block)    Mitral regurgitation    Mild to Moderate   Nonischemic cardiomyopathy (HCC)    LVEF 35-40% by echo 8/13   Paroxysmal atrial fibrillation (HCC)    Brief episodes by device interrogation   UTI (urinary tract infection)    Recurrent   Ventricular tachycardia    ICD therapy for VT    Past Surgical History:  Procedure Laterality Date   APPENDECTOMY     BIV ICD GENERATOR CHANGEOUT N/A 07/11/2016   Procedure: BiV ICD Generator Changeout;  Surgeon: Thompson Grayer, MD;  Location: [place] [facility];  Service: Cardiovascular;  Laterality: N/A;   CARDIAC DEFIBRILLATOR PLACEMENT  09/2009   St. Jude BiV ICD by Dr.  Sandria Manly class III   CHOLECYSTECTOMY     has one ovary left     KNEE ARTHROSCOPY     Left   LEFT AND RIGHT HEART CATHETERIZATION WITH CORONARY ANGIOGRAM N/A 04/12/2014   Procedure: LEFT AND RIGHT HEART CATHETERIZATION WITH CORONARY ANGIOGRAM;  Surgeon: Leonie Man, MD;  Location: [facility];  Service: Cardiovascular;  Laterality: N/A;   Left breast biopsy x 2     no maliganancy   PACEMAKER IMPLANT     RIGHT HEART CATH N/A 10/27/2020   Procedure: RIGHT HEART CATH;  Surgeon: [name], MD;  Location: [place] [facility];  Service: Cardiovascular;  Laterality: N/A;   RIGHT/LEFT HEART CATH AND CORONARY ANGIOGRAPHY N/A 03/26/2019   Procedure: RIGHT/LEFT HEART CATH AND CORONARY ANGIOGRAPHY;  Surgeon: [name], MD;  Location: [place] [facility];  Service: Cardiovascular;  Laterality: N/A;   TOTAL ABDOMINAL HYSTERECTOMY     (R) ovary removed    Current Outpatient Medications  Medication Sig Dispense Refill   acetaminophen (TYLENOL) 650 MG CR tablet Take 650 mg by mouth every 8 (eight) hours as needed for pain.     apixaban (ELIQUIS) 5 MG TABS tablet Take 1 tablet (5 mg total) by mouth 2 (two) times daily. Need to schedule an appointment for further refills 180 tablet 0   blood glucose meter kit and supplies Dispense based on patient and insurance preference.  Use up to four times daily as directed. (FOR ICD-10 E10.9, E11.9). 1 each 0   carvedilol (COREG) 12.5 MG tablet Take 1 tablet (12.5 mg total) by mouth 2 (two) times daily. 180 tablet 3   clobetasol ointment (TEMOVATE) 0.05 % Apply 1 application topically 2 (two) times daily. Apply 2-3 x weekly 30 g 3   dapagliflozin propanediol (FARXIGA) 10 MG TABS tablet Take 1 tablet (10 mg total) by mouth daily. 30 tablet 6   furosemide (LASIX) 80 MG tablet TAKE 1 TABLET BY MOUTH ONCE DAILY AS NEEDED FOR  EXTRA  FLUID 90 tablet 0   hydrOXYzine (VISTARIL) 25 MG capsule TAKE 1 CAPSULE BY MOUTH THREE TIMES DAILY 90 capsule 0   levothyroxine  (EUTHYROX) 88 MCG tablet TAKE 1 TABLET BY MOUTH ONCE DAILY BEFORE BREAKFAST *NEED LABWORK* 90 tablet 4   metFORMIN (GLUCOPHAGE-XR) 750 MG 24 hr tablet Take 750 mg by mouth 2 (two) times daily.     pantoprazole (PROTONIX) 40 MG tablet Take 40 mg by mouth daily.     spironolactone (ALDACTONE) 25 MG tablet Take 1 tablet (25 mg total) by mouth daily. 90 tablet 3   Vericiguat (VERQUVO) 5 MG TABS Take 1.5 tablets (7.5 mg total) by mouth daily at 12 noon. 45 tablet 6   No current facility-administered medications for this visit.   Allergies:  Buspar [buspirone], Lexapro [escitalopram oxalate], Trulicity [dulaglutide], Bactrim, Cephalexin, Chlorhexidine gluconate, Clindamycin, Contrast media [iodinated contrast media], Doxycycline, Latex, Levofloxacin, Nitrofurantoin monohyd macro, and Penicillins   ROS: No orthopnea or PND.  Physical Exam: VS:  BP 110/60    Pulse 70    Ht 5' 2.5" (1.588 m)    Wt 179 lb 12.8 oz (81.6 kg)    SpO2 98%    BMI 32.36 kg/m , BMI Body mass index is 32.36 kg/m.  Wt Readings from Last 3 Encounters:  02/21/21 179 lb 12.8 oz (81.6 kg)  02/16/21 179 lb (81.2 kg)  02/01/21 182 lb 8 oz (82.8 kg)    General: Patient appears comfortable at rest. HEENT: Conjunctiva and lids normal, mask. Neck: Supple, no elevated JVP or carotid bruits, no thyromegaly. Lungs: Clear to auscultation, nonlabored breathing at rest. Cardiac: Regular rate and rhythm, no S3 or significant systolic murmur, no pericardial rub. Extremities: No pitting edema.  ECG:  An ECG dated 11/17/2020 was personally reviewed today and demonstrated:  Dual chamber pacing with PVC.  Recent Labwork: 04/26/2020: TSH 2.960 06/27/2020: ALT 10; AST 9 10/16/2020: B Natriuretic Peptide 247.9; BUN 13; Creatinine, Ser 1.15; Platelets 309 10/27/2020: Hemoglobin 10.9; Potassium 3.2; Sodium 141     Component Value Date/Time   CHOL 156 10/19/2018 1133   TRIG 89 10/19/2018 1133   HDL 45 10/19/2018 1133   CHOLHDL 3.5 10/19/2018  1133   CHOLHDL 3.3 04/11/2007 0600   VLDL 10 04/11/2007 0600   LDLCALC 94 10/19/2018 1133    Other Studies Reviewed Today:  Echocardiogram 11/17/2020:  1. Left ventricular ejection fraction, by estimation, is 25 to 30%. The  left ventricle has severely decreased function. The left ventricle  demonstrates global hypokinesis. The left ventricular internal cavity size  was moderately dilated. Left  ventricular diastolic parameters are consistent with Grade II diastolic  dysfunction (pseudonormalization).   2. Right ventricular systolic function is mildly reduced. The right  ventricular size is mildly enlarged. There is normal pulmonary artery  systolic pressure. The estimated right ventricular systolic pressure is  25.8 mmHg.   3. Left atrial size was mildly dilated.  4. Right atrial size was mildly dilated.   5. The mitral valve is normal in structure. Mild mitral valve  regurgitation. No evidence of mitral stenosis.   6. The aortic valve is tricuspid. Aortic valve regurgitation is trivial.  No aortic stenosis is present.   7. The inferior vena cava is normal in size with greater than 50%  respiratory variability, suggesting right atrial pressure of 3 mmHg.   Assessment and Plan:  1.  HFrEF with nonischemic cardiomyopathy and stable chronic systolic heart failure symptoms.  No significant change in weight, NYHA class II-III dyspnea as before.  Right heart catheterization in October 2022 revealed low filling pressures with preserved cardiac output.  Plan is to continue observation on current regimen including Coreg, Aldactone, Farxiga, and Verquvo.  2.  St. Jude biventricular ICD in place with history of PVCs and VT.  She continues to follow with EP.  No device shocks or syncope.  3.  Paroxysmal atrial fibrillation, continue Eliquis for stroke prophylaxis with CHA2DS2-VASc score of 6.  Follow-up CBC and BMET.  Medication Adjustments/Labs and Tests Ordered: Current medicines are  reviewed at length with the patient today.  Concerns regarding medicines are outlined above.   Tests Ordered: Orders Placed This Encounter  Procedures   Basic metabolic panel   CBC    Medication Changes: No orders of the defined types were placed in this encounter.   Disposition:  Follow up  4 months.  Signed, Satira Sark, MD, [facility] 02/21/2021 11:31 AM    [facility] [place], [place], [place] [address] Phone: [phone]; Fax: [phone]

## 2021-02-20 NOTE — Telephone Encounter (Signed)
Advanced Heart Failure Patient Advocate Encounter   Patient was approved to receive Eliquis from [employer]  Patient ID: [id number] Effective dates: 02/18/21 through 01/20/22  Charlann Boxer, CPhT

## 2021-02-21 ENCOUNTER — Ambulatory Visit (INDEPENDENT_AMBULATORY_CARE_PROVIDER_SITE_OTHER): Payer: Medicare Other | Admitting: Cardiology

## 2021-02-21 ENCOUNTER — Encounter: Payer: Self-pay | Admitting: Cardiology

## 2021-02-21 VITALS — BP 110/60 | HR 70 | Ht 62.5 in | Wt 179.8 lb

## 2021-02-21 DIAGNOSIS — Z79899 Other long term (current) drug therapy: Secondary | ICD-10-CM | POA: Diagnosis not present

## 2021-02-21 DIAGNOSIS — I5020 Unspecified systolic (congestive) heart failure: Secondary | ICD-10-CM

## 2021-02-21 DIAGNOSIS — I480 Paroxysmal atrial fibrillation: Secondary | ICD-10-CM | POA: Diagnosis not present

## 2021-02-21 DIAGNOSIS — I428 Other cardiomyopathies: Secondary | ICD-10-CM | POA: Diagnosis not present

## 2021-02-21 NOTE — Patient Instructions (Addendum)
Medication Instructions:  Your physician recommends that you continue on your current medications as directed. Please refer to the Current Medication list given to you today.  Labwork: BMET & CBC today at [facility]  Testing/Procedures: none  Follow-Up: Your physician recommends that you schedule a follow-up appointment in: 4 months   Any Other Special Instructions Will Be Listed Below (If Applicable).  If you need a refill on your cardiac medications before your next appointment, please call your pharmacy.

## 2021-02-23 ENCOUNTER — Encounter (HOSPITAL_COMMUNITY): Payer: Medicare Other | Admitting: Cardiology

## 2021-02-28 ENCOUNTER — Telehealth: Payer: Medicare Other

## 2021-03-01 ENCOUNTER — Ambulatory Visit: Payer: Medicare Other | Admitting: Adult Health

## 2021-03-02 ENCOUNTER — Telehealth: Payer: Self-pay | Admitting: Family

## 2021-03-02 MED ORDER — METFORMIN HCL ER 750 MG PO TB24: 750.0000 mg | ORAL_TABLET | Freq: Two times a day (BID) | ORAL | 3 refills | Status: DC

## 2021-03-02 NOTE — Telephone Encounter (Signed)
°  Prescription Request  03/02/2021  Is this a "Controlled Substance" medicine? no  Have you seen your PCP in the last 2 weeks? No pt has apt 03/07/2021  If YES, route message to pool  -  If NO, patient needs to be scheduled for appointment.  What is the name of the medication or equipment? metFORMIN (GLUCOPHAGE-XR) 750 MG 24 hr tablet   Have you contacted your pharmacy to request a refill? Yes NTBS   Which pharmacy would you like this sent to? [facility].  Pt took last pill this am and will not have medication for tonight.   Patient notified that their request is being sent to the clinical staff for review and that they should receive a response within 2 business days.

## 2021-03-02 NOTE — Telephone Encounter (Signed)
Left detailed message rx sent

## 2021-03-06 ENCOUNTER — Ambulatory Visit: Payer: Medicare Other | Admitting: Family

## 2021-03-07 ENCOUNTER — Ambulatory Visit: Payer: Medicare Other | Admitting: Adult Health

## 2021-03-09 ENCOUNTER — Ambulatory Visit (INDEPENDENT_AMBULATORY_CARE_PROVIDER_SITE_OTHER): Payer: Medicare Other | Admitting: Family

## 2021-03-09 ENCOUNTER — Encounter: Payer: Self-pay | Admitting: Family

## 2021-03-09 VITALS — BP 110/55 | HR 97 | Temp 97.0°F | Ht 62.5 in | Wt 176.4 lb

## 2021-03-09 DIAGNOSIS — R109 Unspecified abdominal pain: Secondary | ICD-10-CM | POA: Diagnosis not present

## 2021-03-09 DIAGNOSIS — I480 Paroxysmal atrial fibrillation: Secondary | ICD-10-CM

## 2021-03-09 DIAGNOSIS — E1165 Type 2 diabetes mellitus with hyperglycemia: Secondary | ICD-10-CM | POA: Diagnosis not present

## 2021-03-09 DIAGNOSIS — I5023 Acute on chronic systolic (congestive) heart failure: Secondary | ICD-10-CM | POA: Diagnosis not present

## 2021-03-09 DIAGNOSIS — N12 Tubulo-interstitial nephritis, not specified as acute or chronic: Secondary | ICD-10-CM

## 2021-03-09 LAB — MICROSCOPIC EXAMINATION
RBC, Urine: NONE SEEN /hpf (ref 0–2)
Renal Epithel, UA: NONE SEEN /hpf

## 2021-03-09 LAB — URINALYSIS, COMPLETE
Bilirubin, UA: NEGATIVE
Nitrite, UA: NEGATIVE
RBC, UA: NEGATIVE
Specific Gravity, UA: 1.015 (ref 1.005–1.030)
Urobilinogen, Ur: 0.2 mg/dL (ref 0.2–1.0)
pH, UA: 5 (ref 5.0–7.5)

## 2021-03-09 LAB — BAYER DCA HB A1C WAIVED: HB A1C (BAYER DCA - WAIVED): 6.8 % — ABNORMAL HIGH (ref 4.8–5.6)

## 2021-03-09 MED ORDER — FOSFOMYCIN TROMETHAMINE 3 G PO PACK: 3.0000 g | PACK | Freq: Once | ORAL | 0 refills | Status: AC

## 2021-03-09 NOTE — Progress Notes (Signed)
Subjective:    Patient ID: Melissa Paul, female    DOB: 10-27-1945, 76 y.o.   MRN: [id number]  Chief Complaint  Patient presents with   Medical Management of Chronic Issues    Pt presents to the office today for chronic follow up on uncontrolled DM.    She is followed by Cardiologists every 3 months for CHF, A Fib, mitral regurgitation. She has a pacemaker and followed by cardiophysiologist every 6 months.  Diabetes She presents for her follow-up diabetic visit. She has type 2 diabetes mellitus. There are no hypoglycemic associated symptoms. Pertinent negatives for hypoglycemia include no nervousness/anxiousness. Associated symptoms include blurred vision and foot paresthesias. Pertinent negatives for diabetes include no fatigue. Symptoms are stable. Diabetic complications include heart disease and peripheral neuropathy. Risk factors for coronary artery disease include dyslipidemia, diabetes mellitus, hypertension, sedentary lifestyle and post-menopausal. She is following a generally unhealthy diet. Her overall blood glucose range is 140-180 mg/dl. Eye exam is current.  Congestive Heart Failure Presents for follow-up visit. Pertinent negatives include no edema or fatigue. The symptoms have been stable.  Thyroid Problem Presents for follow-up visit. Patient reports no anxiety, diarrhea or fatigue. The symptoms have been stable.  Anxiety Presents for follow-up visit. Symptoms include excessive worry, insomnia, irritability, nausea and restlessness. Patient reports no nervous/anxious behavior. Symptoms occur most days. The severity of symptoms is moderate.    Dysuria  This is a new problem. The current episode started in the past 7 days. The problem occurs intermittently. The problem has been waxing and waning. The quality of the pain is described as burning and aching. The pain is at a severity of 8/10. The pain is moderate. There has been no fever. Associated symptoms include frequency,  hesitancy, nausea and urgency. Pertinent negatives include no hematuria or vomiting.     Review of Systems  Constitutional:  Positive for irritability. Negative for fatigue.  Eyes:  Positive for blurred vision.  Gastrointestinal:  Positive for nausea. Negative for diarrhea and vomiting.  Genitourinary:  Positive for dysuria, frequency, hesitancy and urgency. Negative for hematuria.  Psychiatric/Behavioral:  The patient has insomnia. The patient is not nervous/anxious.   All other systems reviewed and are negative.     Objective:   Physical Exam Vitals reviewed.  Constitutional:      General: She is not in acute distress.    Appearance: She is well-developed. She is obese.  HENT:     Head: Normocephalic and atraumatic.     Right Ear: Tympanic membrane normal.     Left Ear: Tympanic membrane normal.  Eyes:     Pupils: Pupils are equal, round, and reactive to light.  Neck:     Thyroid: No thyromegaly.  Cardiovascular:     Rate and Rhythm: Normal rate and regular rhythm.     Heart sounds: Normal heart sounds. No murmur heard. Pulmonary:     Effort: Pulmonary effort is normal. No respiratory distress.     Breath sounds: Normal breath sounds. No wheezing.  Abdominal:     General: Bowel sounds are normal. There is no distension.     Palpations: Abdomen is soft.     Tenderness: There is no abdominal tenderness. There is left CVA tenderness.  Musculoskeletal:        General: No tenderness. Normal range of motion.     Cervical back: Normal range of motion and neck supple.  Skin:    General: Skin is warm and dry.  Neurological:  Mental Status: She is alert and oriented to person, place, and time.     Cranial Nerves: No cranial nerve deficit.     Motor: Weakness (using rolling walker) present.     Deep Tendon Reflexes: Reflexes are normal and symmetric.  Psychiatric:        Behavior: Behavior normal.        Thought Content: Thought content normal.        Judgment: Judgment  normal.      BP (!) 110/55    Pulse 97    Temp (!) 97 F (36.1 C) (Temporal)    Ht 5' 2.5" (1.588 m)    Wt 176 lb 6.4 oz (80 kg)    BMI 31.75 kg/m      Assessment & Plan:  [name] comes in today with chief complaint of Medical Management of Chronic Issues   Diagnosis and orders addressed:  1. Flank pain - Urinalysis, Complete - Urine Culture - CMP14+EGFR - CBC with Differential/Platelet  2. Type 2 diabetes mellitus with hyperglycemia, without long-term current use of insulin (HCC) - Microalbumin / creatinine urine ratio - CMP14+EGFR - CBC with Differential/Platelet - Bayer DCA Hb A1c Waived  3. Acute on chronic systolic ACC/AHA stage C congestive heart failure (HCC) - CMP14+EGFR - CBC with Differential/Platelet  4. Paroxysmal atrial fibrillation (HCC) - CMP14+EGFR - CBC with Differential/Platelet  5. Pyelonephritis Pt has multiple drug allergies. Unsure if these are all true, but per patient she "swellings and breaks out if hives".  Will have to follow up on 03/13/21 If symptoms worsen go to ED - fosfomycin (MONUROL) 3 g PACK; Take 3 g by mouth once for 1 dose.  Dispense: 3 g; Refill: 0 - CBC with Differential/Platelet   Labs pending Health Maintenance reviewed Diet and exercise encouraged  Follow up plan: On Tuesday for pyelonephritis   Evelina Dun, FNP

## 2021-03-09 NOTE — Patient Instructions (Signed)
Pyelonephritis, Adult Pyelonephritis is an infection that occurs in the kidney. The kidneys are the organs that filter a person's blood and move waste out of the bloodstream and into the urine. Urine passes from the kidneys, through tubes called ureters, and into the bladder. There are two main types of pyelonephritis: Infections that come on quickly without any warning (acute pyelonephritis). Infections that last for a long period of time (chronic pyelonephritis). In most cases, the infection clears up with treatment and does not cause further problems. More severe infections or chronic infections can sometimes spread to the bloodstream or lead to other problems with the kidneys. What are the causes? This condition is usually caused by: Bacteria traveling from the bladder up to the kidney. This may occur after having a bladder infection (cystitis) or urinary tract infection (UTI). Bladder infections caused from bacteria traveling from the bloodstream to the kidney. What increases the risk? This condition is more likely to develop in: Pregnant women. Older people. People who have any of these conditions: Diabetes. Inflammation of the prostate gland (prostatitis), in males. Kidney stones or bladder stones. Other abnormalities of the kidney or ureter. Cancer. People who have a catheter placed in the bladder. People who are sexually active. Women who use spermicides. People who have had a prior UTI. What are the signs or symptoms? Symptoms of this condition include: Frequent urination. Strong or persistent urge to urinate. Burning or stinging when urinating. Abdominal pain. Back pain. Pain in the side or flank area. Fever or chills. Blood in the urine, or dark urine. Nausea or vomiting. How is this diagnosed? This condition may be diagnosed based on: Your medical history and a physical exam. Urine tests. Blood tests. You may also have imaging tests of the kidneys, such as an  ultrasound or CT scan. How is this treated? Treatment for this condition may depend on the severity of the infection. If the infection is mild and is found early, you may be treated with antibiotic medicines taken by mouth (orally). You will need to drink fluids to remain hydrated. If the infection is more severe, you may need to stay in the hospital and receive antibiotics given directly into a vein through an IV. You may also need to receive fluids through an IV if you are not able to remain hydrated. After your hospital stay, you may need to take oral antibiotics for a period of time. Other treatments may be required, depending on the cause of the infection. Follow these instructions at home: Medicines Take your antibiotic medicine as told by your health care provider. Do not stop taking the antibiotic even if you start to feel better. Take over-the-counter and prescription medicines only as told by your health care provider. General instructions  Drink enough fluid to keep your urine pale yellow. Avoid caffeine, tea, and carbonated beverages. They tend to irritate the bladder. Urinate often. Avoid holding in urine for long periods of time. Urinate before and after sex. After a bowel movement, women should cleanse from front to back. Use each tissue only once. Keep all follow-up visits as told by your health care provider. This is important. Contact a health care provider if: Your symptoms do not get better after 2 days of treatment. Your symptoms get worse. You have a fever. Get help right away if you: Are unable to take your antibiotics or fluids. Have shaking chills. Vomit. Have severe flank or back pain. Have extreme weakness or fainting. Summary Pyelonephritis is a urinary tract infection (  UTI) that occurs in the kidney. Treatment for this condition may depend on the severity of the infection. Take your antibiotic medicine as told by your health care provider. Do not stop  taking the antibiotic even if you start to feel better. Drink enough fluid to keep your urine pale yellow. Keep all follow-up visits as told by your health care provider. This is important. This information is not intended to replace advice given to you by your health care provider. Make sure you discuss any questions you have with your health care provider. Document Revised: 08/17/2020 Document Reviewed: 08/17/2020 Elsevier Patient Education  [place].

## 2021-03-10 LAB — CBC WITH DIFFERENTIAL/PLATELET
Basophils Absolute: 0 10*3/uL (ref 0.0–0.2)
Basos: 1 %
EOS (ABSOLUTE): 0 10*3/uL (ref 0.0–0.4)
Eos: 0 %
Hematocrit: 35.4 % (ref 34.0–46.6)
Hemoglobin: 11.6 g/dL (ref 11.1–15.9)
Immature Grans (Abs): 0.1 10*3/uL (ref 0.0–0.1)
Immature Granulocytes: 1 %
Lymphocytes Absolute: 2.6 10*3/uL (ref 0.7–3.1)
Lymphs: 33 %
MCH: 25.5 pg — ABNORMAL LOW (ref 26.6–33.0)
MCHC: 32.8 g/dL (ref 31.5–35.7)
MCV: 78 fL — ABNORMAL LOW (ref 79–97)
Monocytes Absolute: 0.6 10*3/uL (ref 0.1–0.9)
Monocytes: 8 %
Neutrophils Absolute: 4.5 10*3/uL (ref 1.4–7.0)
Neutrophils: 57 %
Platelets: 331 10*3/uL (ref 150–450)
RBC: 4.55 x10E6/uL (ref 3.77–5.28)
RDW: 14.8 % (ref 11.7–15.4)
WBC: 7.8 10*3/uL (ref 3.4–10.8)

## 2021-03-10 LAB — CMP14+EGFR
ALT: 9 IU/L (ref 0–32)
AST: 11 IU/L (ref 0–40)
Albumin/Globulin Ratio: 2.1 (ref 1.2–2.2)
Albumin: 4.9 g/dL — ABNORMAL HIGH (ref 3.7–4.7)
Alkaline Phosphatase: 96 IU/L (ref 44–121)
BUN/Creatinine Ratio: 15 (ref 12–28)
BUN: 22 mg/dL (ref 8–27)
Bilirubin Total: 0.6 mg/dL (ref 0.0–1.2)
CO2: 25 mmol/L (ref 20–29)
Calcium: 9.5 mg/dL (ref 8.7–10.3)
Chloride: 97 mmol/L (ref 96–106)
Creatinine, Ser: 1.5 mg/dL — ABNORMAL HIGH (ref 0.57–1.00)
Globulin, Total: 2.3 g/dL (ref 1.5–4.5)
Glucose: 129 mg/dL — ABNORMAL HIGH (ref 70–99)
Potassium: 4 mmol/L (ref 3.5–5.2)
Sodium: 139 mmol/L (ref 134–144)
Total Protein: 7.2 g/dL (ref 6.0–8.5)
eGFR: 36 mL/min/{1.73_m2} — ABNORMAL LOW (ref >59)

## 2021-03-10 LAB — MICROALBUMIN / CREATININE URINE RATIO
Creatinine, Urine: 234.3 mg/dL
Microalb/Creat Ratio: 14 mg/g creat (ref 0–29)
Microalbumin, Urine: 33 ug/mL

## 2021-03-12 ENCOUNTER — Ambulatory Visit (INDEPENDENT_AMBULATORY_CARE_PROVIDER_SITE_OTHER): Payer: Medicare Other

## 2021-03-12 DIAGNOSIS — I5020 Unspecified systolic (congestive) heart failure: Secondary | ICD-10-CM

## 2021-03-12 DIAGNOSIS — Z95810 Presence of automatic (implantable) cardiac defibrillator: Secondary | ICD-10-CM | POA: Diagnosis not present

## 2021-03-13 LAB — URINE CULTURE

## 2021-03-13 NOTE — Progress Notes (Signed)
EPIC Encounter for ICM Monitoring  Patient Name: Melissa Paul is a 76 y.o. female Date: 03/13/2021 Primary Care Physican: Sharion Balloon, FNP Primary Cardiologist: [facility] Electrophysiologist: Allred Bi-V Pacing: 93%        02/07/2021 Weight: 172 lbs   AT/AF Burden: 3.3% (taking Eliquis)         Spoke with patient and heart failure questions reviewed.  Pt reports she has a bladder and kidney infection and drinking more fluids.     CorVue Thoracic impedance suggesting normal fluid levels but just starting downward trend.   Prescribed:  Furosemide 80 mg Take 80 mg by mouth daily. Take and additional 80 mg if needed. Spirolactone 25 mg take 1 tablet at bedtime Farxiga 5 mg take 1 tablet daily   Labs: 03/09/2021 Creatinine 1.50, BUN 22, Potassium 4.0, Sodium 139, GFR 36 10/17/2020 Creatinine 1.36, BUN 31, Potassium 5.1, Sodium 143, GFR 53 09/19/2020 Creatinine 1.19, BUN 27, Potassium 5.1, Sodium 139, GFR 62 08/29/2020 Creatinine 1.31, BUN 15, Potassium 3.7, Sodium 136, GFR 42 07/11/2020 Creatinine 1.13, BUN 12, Potassium 4.0, Sodium 139, GFR 50 06/27/2020 Creatinine 1.57, BUN 22, Potassium 4.8, Sodium 134, GFR 34  A complete set of results can be found in Results Review.   Recommendations:   No changes and encouraged to call if experiencing any fluid symptoms.  She will self adjust Furosemide if needed for any fluid symptoms.   Follow-up plan: ICM clinic phone appointment on 03/26/2021 to recheck fluid levels.   91 day device clinic remote transmission 04/16/2021.    EP/Cardiology Office Visits: 06/21/2021 with Dr. Domenic Polite.   Recall 06/06/2021 with [name], PA.   Copy of ICM check sent to Dr. [name].        3 month ICM trend: 03/12/2021.    12-14 Month ICM trend:     [name], RN 03/13/2021 1:48 PM

## 2021-03-15 ENCOUNTER — Ambulatory Visit (INDEPENDENT_AMBULATORY_CARE_PROVIDER_SITE_OTHER): Payer: Medicare Other | Admitting: Adult Health

## 2021-03-15 ENCOUNTER — Encounter: Payer: Self-pay | Admitting: Adult Health

## 2021-03-15 ENCOUNTER — Other Ambulatory Visit: Payer: Self-pay

## 2021-03-15 VITALS — BP 97/62 | HR 72 | Ht 62.5 in | Wt 181.0 lb

## 2021-03-15 DIAGNOSIS — T839XXD Unspecified complication of genitourinary prosthetic device, implant and graft, subsequent encounter: Secondary | ICD-10-CM

## 2021-03-15 NOTE — Progress Notes (Signed)
°  Subjective:     Patient ID: Melissa Paul, female   DOB: 05/12/1945, 76 y.o.   MRN: [id number]  HPI Melissa Paul is a 76 year old white female, widowed, SP hysterectomy in saying pessary came out again after BM, she is currently being treated for UTI and is sore and does not want it put back in today or exam. She had Milex ring #5 with support.  She just had pessary put back in 02/16/21, after coming out with BM.  PCP is [name] NP  Review of Systems Pessary fell out again after BM Has UTI feels sore, and now back hurts Reviewed past medical,surgical, social and family history. Reviewed medications and allergies.     Objective:   Physical Exam BP 97/62 (BP Location: Left Arm, Patient Position: Sitting, Cuff Size: Normal)    Pulse 72    Ht 5' 2.5" (1.588 m)    Wt 181 lb (82.1 kg)    BMI 32.58 kg/m     Skin warm and dry. Lungs: clear to ausculation bilaterally. Cardiovascular: regular rate and rhythm.  She could not void for urine recheck She declined pelvic exam today  Fall risk is moderate, using cane  Upstream - 03/15/21 1338       Pregnancy Intention Screening   Does the patient want to become pregnant in the next year? N/A    Does the patient's partner want to become pregnant in the next year? N/A    Would the patient like to discuss contraceptive options today? N/A      Contraception Wrap Up   Current Method Female Sterilization   hyst   End Method Female Sterilization   hyst   Contraception Counseling Provided No             Assessment:     1. Problem with vaginal pessary, subsequent encounter She will call me when she is ready for exam and pessary fitting     Plan:     She is going to call her PCP about UTI and how she feels or may go to Urgent Care or ER

## 2021-03-26 ENCOUNTER — Ambulatory Visit (INDEPENDENT_AMBULATORY_CARE_PROVIDER_SITE_OTHER): Payer: Medicare Other

## 2021-03-26 DIAGNOSIS — I5022 Chronic systolic (congestive) heart failure: Secondary | ICD-10-CM

## 2021-03-26 DIAGNOSIS — Z95810 Presence of automatic (implantable) cardiac defibrillator: Secondary | ICD-10-CM

## 2021-03-27 ENCOUNTER — Ambulatory Visit: Payer: Medicare Other | Admitting: Family

## 2021-03-28 NOTE — Progress Notes (Signed)
EPIC Encounter for ICM Monitoring ? ?Patient Name: Melissa Paul is a 76 y.o. female ?Date: 03/28/2021 ?Primary Care Physican: Sharion Balloon, FNP ?Primary Cardiologist: [facility] ?Electrophysiologist: Allred ?Bi-V Pacing: 92%        ?03/28/2021 Weight: 172 lbs ?  ?AT/AF Burden: 3.5% (taking Eliquis) ?  ?      Spoke with patient and heart failure questions reviewed.  Pt does not have any fluid symptoms at this time.  ?   ?CorVue Thoracic impedance normal but was suggesting possible fluid accumulation from 2/19-3/1.   ?  ?Prescribed:  ?Furosemide 80 mg Take 80 mg by mouth daily. Take and additional 80 mg if needed. ?Spirolactone 25 mg take 1 tablet at bedtime ?Farxiga 5 mg take 1 tablet daily ?  ?Labs: ?03/09/2021 Creatinine 1.50, BUN 22, Potassium 4.0, Sodium 139, GFR 36 ?10/17/2020 Creatinine 1.36, BUN 31, Potassium 5.1, Sodium 143, GFR 53 ?09/19/2020 Creatinine 1.19, BUN 27, Potassium 5.1, Sodium 139, GFR 62 ?08/29/2020 Creatinine 1.31, BUN 15, Potassium 3.7, Sodium 136, GFR 42 ?07/11/2020 Creatinine 1.13, BUN 12, Potassium 4.0, Sodium 139, GFR 50 ?06/27/2020 Creatinine 1.57, BUN 22, Potassium 4.8, Sodium 134, GFR 34  ?A complete set of results can be found in Results Review. ?  ?Recommendations:   No changes and encouraged to call if experiencing any fluid symptoms.  She will self adjust Furosemide if needed for any fluid symptoms. ?  ?Follow-up plan: ICM clinic phone appointment on 04/17/2021.   91 day device clinic remote transmission 04/16/2021.  ?  ?EP/Cardiology Office Visits: 06/21/2021 with Dr. Domenic Polite.   Recall 06/06/2021 with [name], PA. ?  ?Copy of ICM check sent to Dr. [name].     ? ?3 month ICM trend: 03/26/2021. ? ? ? ?12-14 Month ICM trend:  ? ? ? ?[name], RN ?03/28/2021 ?4:42 PM ? ?

## 2021-03-29 ENCOUNTER — Encounter: Payer: Self-pay | Admitting: Family

## 2021-03-29 ENCOUNTER — Other Ambulatory Visit: Payer: Self-pay | Admitting: Family

## 2021-03-29 ENCOUNTER — Ambulatory Visit (INDEPENDENT_AMBULATORY_CARE_PROVIDER_SITE_OTHER): Payer: Medicare Other | Admitting: Family

## 2021-03-29 VITALS — BP 113/68 | HR 83 | Temp 97.0°F | Ht 62.4 in | Wt 181.0 lb

## 2021-03-29 DIAGNOSIS — F411 Generalized anxiety disorder: Secondary | ICD-10-CM

## 2021-03-29 DIAGNOSIS — N12 Tubulo-interstitial nephritis, not specified as acute or chronic: Secondary | ICD-10-CM | POA: Diagnosis not present

## 2021-03-29 DIAGNOSIS — N390 Urinary tract infection, site not specified: Secondary | ICD-10-CM

## 2021-03-29 DIAGNOSIS — R109 Unspecified abdominal pain: Secondary | ICD-10-CM | POA: Diagnosis not present

## 2021-03-29 DIAGNOSIS — N1832 Chronic kidney disease, stage 3b: Secondary | ICD-10-CM | POA: Diagnosis not present

## 2021-03-29 DIAGNOSIS — G4700 Insomnia, unspecified: Secondary | ICD-10-CM

## 2021-03-29 NOTE — Progress Notes (Signed)
? ?  Subjective:  ? ? Patient ID: Melissa Paul, female    DOB: 1945-04-17, 76 y.o.   MRN: [id number] ? ?Chief Complaint  ?Patient presents with  ? Follow-up  ? ?Pt presents to the office today with recurrent UTI. She was seen on 03/09/21 and was given Fosfomycin. She has multiple drug allergies. I am unsure if these are all true allergies, but per patient she has "swelling and hives".  ? ?She has seen Urologists in the past, but has been over a year.  ? ?She also has CKD and we need to recheck creatinine today.  ?Flank Pain ?This is a recurrent problem. The current episode started more than 1 month ago. The problem has been waxing and waning since onset.  ? ? ? ?Review of Systems  ?Genitourinary:  Positive for flank pain.  ?All other systems reviewed and are negative. ? ?   ?Objective:  ? Physical Exam ?Vitals reviewed.  ?Constitutional:   ?   General: She is not in acute distress. ?   Appearance: She is well-developed. She is obese.  ?HENT:  ?   Head: Normocephalic and atraumatic.  ?Eyes:  ?   Pupils: Pupils are equal, round, and reactive to light.  ?Neck:  ?   Thyroid: No thyromegaly.  ?Cardiovascular:  ?   Rate and Rhythm: Normal rate and regular rhythm.  ?   Heart sounds: Normal heart sounds. No murmur heard. ?Pulmonary:  ?   Effort: Pulmonary effort is normal. No respiratory distress.  ?   Breath sounds: Normal breath sounds. No wheezing.  ?Abdominal:  ?   General: Bowel sounds are normal. There is no distension.  ?   Palpations: Abdomen is soft.  ?   Tenderness: There is no abdominal tenderness. There is left CVA tenderness.  ?Musculoskeletal:     ?   General: No tenderness. Normal range of motion.  ?   Cervical back: Normal range of motion and neck supple.  ?Skin: ?   General: Skin is warm and dry.  ?Neurological:  ?   Mental Status: She is alert and oriented to person, place, and time.  ?   Cranial Nerves: No cranial nerve deficit.  ?   Deep Tendon Reflexes: Reflexes are normal and symmetric.  ?Psychiatric:      ?   Behavior: Behavior normal.     ?   Thought Content: Thought content normal.     ?   Judgment: Judgment normal.  ? ? ?BP 113/68   Pulse 83   Temp (!) 97 ?F (36.1 ?C) (Temporal)   Ht 5' 2.4" (1.585 m)   Wt 181 lb (82.1 kg)   BMI 32.68 kg/m?  ? ? ? ?   ?Assessment & Plan:  ?Melissa Paul comes in today with chief complaint of Follow-up ? ? ?Diagnosis and orders addressed: ? ?1. Recurrent UTI ?Referral to Urologists pending  ?Force fluids ? ?- Ambulatory referral to Urology ?- CMP14+EGFR ?- CBC with Differential/Platelet ? ?2. Pyelonephritis ?Urine pending  ?- Ambulatory referral to Urology ?- CMP14+EGFR ?- CBC with Differential/Platelet ? ?3. Flank pain ? ? ?4. Stage 3b chronic kidney disease ([place]) ?Avoid NSAID's ? ?Labs pending ?Health Maintenance reviewed ?Diet and exercise encouraged ? ?Follow up plan: ?Depending on labs and urine ? ? ?Melissa Dun, FNP ? ? ? ?

## 2021-03-29 NOTE — Patient Instructions (Signed)
Pyelonephritis, Adult Pyelonephritis is an infection that occurs in the kidney. The kidneys are the organs that filter a person's blood and move waste out of the bloodstream and into the urine. Urine passes from the kidneys, through tubes called ureters, and into the bladder. There are two main types of pyelonephritis: Infections that come on quickly without any warning (acute pyelonephritis). Infections that last for a long period of time (chronic pyelonephritis). In most cases, the infection clears up with treatment and does not cause further problems. More severe infections or chronic infections can sometimes spread to the bloodstream or lead to other problems with the kidneys. What are the causes? This condition is usually caused by: Bacteria traveling from the bladder up to the kidney. This may occur after having a bladder infection (cystitis) or urinary tract infection (UTI). Bladder infections caused from bacteria traveling from the bloodstream to the kidney. What increases the risk? This condition is more likely to develop in: Pregnant women. Older people. People who have any of these conditions: Diabetes. Inflammation of the prostate gland (prostatitis), in males. Kidney stones or bladder stones. Other abnormalities of the kidney or ureter. Cancer. People who have a catheter placed in the bladder. People who are sexually active. Women who use spermicides. People who have had a prior UTI. What are the signs or symptoms? Symptoms of this condition include: Frequent urination. Strong or persistent urge to urinate. Burning or stinging when urinating. Abdominal pain. Back pain. Pain in the side or flank area. Fever or chills. Blood in the urine, or dark urine. Nausea or vomiting. How is this diagnosed? This condition may be diagnosed based on: Your medical history and a physical exam. Urine tests. Blood tests. You may also have imaging tests of the kidneys, such as an  ultrasound or CT scan. How is this treated? Treatment for this condition may depend on the severity of the infection. If the infection is mild and is found early, you may be treated with antibiotic medicines taken by mouth (orally). You will need to drink fluids to remain hydrated. If the infection is more severe, you may need to stay in the hospital and receive antibiotics given directly into a vein through an IV. You may also need to receive fluids through an IV if you are not able to remain hydrated. After your hospital stay, you may need to take oral antibiotics for a period of time. Other treatments may be required, depending on the cause of the infection. Follow these instructions at home: Medicines Take your antibiotic medicine as told by your health care provider. Do not stop taking the antibiotic even if you start to feel better. Take over-the-counter and prescription medicines only as told by your health care provider. General instructions  Drink enough fluid to keep your urine pale yellow. Avoid caffeine, tea, and carbonated beverages. They tend to irritate the bladder. Urinate often. Avoid holding in urine for long periods of time. Urinate before and after sex. After a bowel movement, women should cleanse from front to back. Use each tissue only once. Keep all follow-up visits as told by your health care provider. This is important. Contact a health care provider if: Your symptoms do not get better after 2 days of treatment. Your symptoms get worse. You have a fever. Get help right away if you: Are unable to take your antibiotics or fluids. Have shaking chills. Vomit. Have severe flank or back pain. Have extreme weakness or fainting. Summary Pyelonephritis is a urinary tract infection (  UTI) that occurs in the kidney. ?Treatment for this condition may depend on the severity of the infection. ?Take your antibiotic medicine as told by your health care provider. Do not stop  taking the antibiotic even if you start to feel better. ?Drink enough fluid to keep your urine pale yellow. ?Keep all follow-up visits as told by your health care provider. This is important. ?This information is not intended to replace advice given to you by your health care provider. Make sure you discuss any questions you have with your health care provider. ?Document Revised: 08/17/2020 Document Reviewed: 08/17/2020 ?Elsevier Patient Education ? [place]. ? ?

## 2021-03-30 ENCOUNTER — Telehealth: Payer: Medicare Other

## 2021-03-30 ENCOUNTER — Other Ambulatory Visit: Payer: Medicare Other

## 2021-03-30 DIAGNOSIS — N390 Urinary tract infection, site not specified: Secondary | ICD-10-CM

## 2021-03-30 LAB — CBC WITH DIFFERENTIAL/PLATELET
Basophils Absolute: 0 10*3/uL (ref 0.0–0.2)
Basos: 0 %
EOS (ABSOLUTE): 0 10*3/uL (ref 0.0–0.4)
Eos: 1 %
Hematocrit: 32.6 % — ABNORMAL LOW (ref 34.0–46.6)
Hemoglobin: 10.2 g/dL — ABNORMAL LOW (ref 11.1–15.9)
Immature Grans (Abs): 0 10*3/uL (ref 0.0–0.1)
Immature Granulocytes: 1 %
Lymphocytes Absolute: 2.1 10*3/uL (ref 0.7–3.1)
Lymphs: 33 %
MCH: 24.7 pg — ABNORMAL LOW (ref 26.6–33.0)
MCHC: 31.3 g/dL — ABNORMAL LOW (ref 31.5–35.7)
MCV: 79 fL (ref 79–97)
Monocytes Absolute: 0.7 10*3/uL (ref 0.1–0.9)
Monocytes: 11 %
Neutrophils Absolute: 3.5 10*3/uL (ref 1.4–7.0)
Neutrophils: 54 %
Platelets: 271 10*3/uL (ref 150–450)
RBC: 4.13 x10E6/uL (ref 3.77–5.28)
RDW: 15.6 % — ABNORMAL HIGH (ref 11.7–15.4)
WBC: 6.5 10*3/uL (ref 3.4–10.8)

## 2021-03-30 LAB — URINALYSIS
Bilirubin, UA: NEGATIVE
Ketones, UA: NEGATIVE
Nitrite, UA: NEGATIVE
Protein,UA: NEGATIVE
RBC, UA: NEGATIVE
Specific Gravity, UA: 1.01 (ref 1.005–1.030)
Urobilinogen, Ur: 0.2 mg/dL (ref 0.2–1.0)
pH, UA: 5.5 (ref 5.0–7.5)

## 2021-03-30 LAB — CMP14+EGFR
ALT: 7 IU/L (ref 0–32)
AST: 9 IU/L (ref 0–40)
Albumin/Globulin Ratio: 1.8 (ref 1.2–2.2)
Albumin: 4.4 g/dL (ref 3.7–4.7)
Alkaline Phosphatase: 94 IU/L (ref 44–121)
BUN/Creatinine Ratio: 11 — ABNORMAL LOW (ref 12–28)
BUN: 15 mg/dL (ref 8–27)
Bilirubin Total: 0.3 mg/dL (ref 0.0–1.2)
CO2: 26 mmol/L (ref 20–29)
Calcium: 9.1 mg/dL (ref 8.7–10.3)
Chloride: 96 mmol/L (ref 96–106)
Creatinine, Ser: 1.32 mg/dL — ABNORMAL HIGH (ref 0.57–1.00)
Globulin, Total: 2.5 g/dL (ref 1.5–4.5)
Glucose: 110 mg/dL — ABNORMAL HIGH (ref 70–99)
Potassium: 4.2 mmol/L (ref 3.5–5.2)
Sodium: 139 mmol/L (ref 134–144)
Total Protein: 6.9 g/dL (ref 6.0–8.5)
eGFR: 42 mL/min/{1.73_m2} — ABNORMAL LOW (ref >59)

## 2021-04-04 LAB — URINE CULTURE

## 2021-04-05 ENCOUNTER — Other Ambulatory Visit: Payer: Self-pay | Admitting: Family

## 2021-04-05 MED ORDER — CIPROFLOXACIN HCL 500 MG PO TABS: 500.0000 mg | ORAL_TABLET | Freq: Two times a day (BID) | ORAL | 0 refills | Status: DC

## 2021-04-11 ENCOUNTER — Other Ambulatory Visit: Payer: Self-pay | Admitting: Family Medicine

## 2021-04-11 ENCOUNTER — Telehealth: Payer: Self-pay | Admitting: Family

## 2021-04-11 MED ORDER — CIPROFLOXACIN HCL 500 MG PO TABS: 500.0000 mg | ORAL_TABLET | Freq: Two times a day (BID) | ORAL | 0 refills | Status: DC

## 2021-04-11 NOTE — Telephone Encounter (Signed)
Last GFR here 42. I reviewed her meds and this is not listed on her allergy list here.  She has been on this medication x4 since 2021.  She has multiple documented allergies to abx.  If she has developed an allergy to cipro since 04/05/2021 when it was last sent in, she may need to see evaluation in ER for IV abx. ?

## 2021-04-11 NOTE — Telephone Encounter (Signed)
PT AWARE  

## 2021-04-11 NOTE — Telephone Encounter (Signed)
Pt called to check status of this message. Reviewed message with pt her providers notes.  ? ?Pt said she is not taking the Cipro because her pharmacist says its on her allergy list. Pt says it may have been prescribed to her 4x since 2021 but she would have never taken it because her pharmacist would have let her know not to. ?

## 2021-04-11 NOTE — Telephone Encounter (Signed)
Acknowledged, then again, due to multiple drug allergies, I fear that there isn't anything left to prescribe orally in an outpatient setting (she is not a candidate for Macrobid due to impaired renal function).  She will need to have abx in ER via IV. ?

## 2021-04-11 NOTE — Telephone Encounter (Signed)
Per lab on 03/16- ?Estimated Creatinine Clearance: 35.7 mL/min (A) (by C-G formula based on SCr of 1.32 mg/dL (H)). ?Cipro 500 mg BID for 7 days ?[facility] patient please advise. ?

## 2021-04-16 ENCOUNTER — Ambulatory Visit (INDEPENDENT_AMBULATORY_CARE_PROVIDER_SITE_OTHER): Payer: Medicare Other

## 2021-04-16 DIAGNOSIS — I5022 Chronic systolic (congestive) heart failure: Secondary | ICD-10-CM | POA: Diagnosis not present

## 2021-04-16 DIAGNOSIS — I428 Other cardiomyopathies: Secondary | ICD-10-CM

## 2021-04-17 ENCOUNTER — Telehealth: Payer: Self-pay

## 2021-04-17 ENCOUNTER — Ambulatory Visit (INDEPENDENT_AMBULATORY_CARE_PROVIDER_SITE_OTHER): Payer: Medicare Other

## 2021-04-17 DIAGNOSIS — I5022 Chronic systolic (congestive) heart failure: Secondary | ICD-10-CM

## 2021-04-17 DIAGNOSIS — Z95810 Presence of automatic (implantable) cardiac defibrillator: Secondary | ICD-10-CM

## 2021-04-17 LAB — CUP PACEART REMOTE DEVICE CHECK
Battery Remaining Longevity: 23 mo
Battery Remaining Percentage: 29 %
Battery Voltage: 2.89 V
Brady Statistic AP VP Percent: 89 %
Brady Statistic AP VS Percent: 2.4 %
Brady Statistic AS VP Percent: 3.6 %
Brady Statistic AS VS Percent: 1 %
Brady Statistic RA Percent Paced: 83 %
Date Time Interrogation Session: 20230327032521
HighPow Impedance: 69 Ohm
HighPow Impedance: 69 Ohm
Implantable Lead Implant Date: 20110912
Implantable Lead Implant Date: 20110912
Implantable Lead Implant Date: 20120710
Implantable Lead Location: 753858
Implantable Lead Location: 753859
Implantable Lead Location: 753860
Implantable Lead Model: 7122
Implantable Pulse Generator Implant Date: 20180621
Lead Channel Impedance Value: 310 Ohm
Lead Channel Impedance Value: 490 Ohm
Lead Channel Impedance Value: 610 Ohm
Lead Channel Pacing Threshold Amplitude: 0.5 V
Lead Channel Pacing Threshold Amplitude: 0.75 V
Lead Channel Pacing Threshold Amplitude: 0.75 V
Lead Channel Pacing Threshold Pulse Width: 0.5 ms
Lead Channel Pacing Threshold Pulse Width: 0.5 ms
Lead Channel Pacing Threshold Pulse Width: 0.5 ms
Lead Channel Sensing Intrinsic Amplitude: 12 mV
Lead Channel Sensing Intrinsic Amplitude: 2.8 mV
Lead Channel Setting Pacing Amplitude: 2 V
Lead Channel Setting Pacing Amplitude: 2 V
Lead Channel Setting Pacing Amplitude: 2 V
Lead Channel Setting Pacing Pulse Width: 0.5 ms
Lead Channel Setting Pacing Pulse Width: 0.5 ms
Lead Channel Setting Sensing Sensitivity: 0.5 mV
Pulse Gen Serial Number: 9761374

## 2021-04-17 NOTE — Telephone Encounter (Signed)
Remote ICM transmission received.  Attempted call to patient regarding ICM remote transmission and left detailed message per DPR.  Advised to return call for any fluid symptoms or questions. Next ICM remote transmission scheduled 04/25/2021.   ? ?

## 2021-04-17 NOTE — Progress Notes (Signed)
EPIC Encounter for ICM Monitoring ? ?Patient Name: Melissa Paul is a 76 y.o. female ?Date: 04/17/2021 ?Primary Care Physican: Sharion Balloon, FNP ?Primary Cardiologist: [facility] ?Electrophysiologist: Allred ?Bi-V Pacing: 92%        ?03/28/2021 Weight: 172 lbs ?  ?AT/AF Burden: 4.0% (taking Eliquis) ?  ?      Spoke with patient and heart failure questions reviewed.  Pt does not have any fluid symptoms at this time.  ?   ?CorVue Thoracic impedance normal but was suggesting possible fluid accumulation starting 3/26.  Impedance also suggesting possible fluid accumulation from 3/5-3/12.   ?  ?Prescribed:  ?Furosemide 80 mg Take 80 mg by mouth daily. Take and additional 80 mg if needed. ?Spirolactone 25 mg take 1 tablet at bedtime ?Farxiga 5 mg take 1 tablet daily ?  ?Labs: ?03/09/2021 Creatinine 1.50, BUN 22, Potassium 4.0, Sodium 139, GFR 36 ?10/17/2020 Creatinine 1.36, BUN 31, Potassium 5.1, Sodium 143, GFR 53 ?09/19/2020 Creatinine 1.19, BUN 27, Potassium 5.1, Sodium 139, GFR 62 ?08/29/2020 Creatinine 1.31, BUN 15, Potassium 3.7, Sodium 136, GFR 42 ?07/11/2020 Creatinine 1.13, BUN 12, Potassium 4.0, Sodium 139, GFR 50 ?06/27/2020 Creatinine 1.57, BUN 22, Potassium 4.8, Sodium 134, GFR 34  ?A complete set of results can be found in Results Review. ?  ?Recommendations:   No changes and encouraged to call if experiencing any fluid symptoms.  She will self adjust Furosemide if needed for any fluid symptoms. ?  ?Follow-up plan: ICM clinic phone appointment on 04/25/2021 to recheck fluid levels.   91 day device clinic remote transmission 07/16/2021.  ?  ?EP/Cardiology Office Visits: 06/21/2021 with Dr. Domenic Polite.   Recall 06/06/2021 with [name], PA. ?  ?Copy of ICM check sent to Dr. [name].    Will send to Dr Domenic Polite for review if patient is reached. ?  ?3 month ICM trend: 04/16/2021. ? ? ? ?12-14 Month ICM trend:  ? ? ? ?[name], RN ?04/17/2021 ?10:52 AM ? ?

## 2021-04-25 ENCOUNTER — Ambulatory Visit (INDEPENDENT_AMBULATORY_CARE_PROVIDER_SITE_OTHER): Payer: Medicare Other

## 2021-04-25 DIAGNOSIS — I5022 Chronic systolic (congestive) heart failure: Secondary | ICD-10-CM

## 2021-04-25 DIAGNOSIS — Z95810 Presence of automatic (implantable) cardiac defibrillator: Secondary | ICD-10-CM

## 2021-04-25 NOTE — Progress Notes (Signed)
Remote ICD transmission.   

## 2021-04-25 NOTE — Progress Notes (Signed)
EPIC Encounter for ICM Monitoring ? ?Patient Name: Melissa Paul is a 76 y.o. female ?Date: 04/25/2021 ?Primary Care Physican: Sharion Balloon, FNP ?Primary Cardiologist: [facility] ?Electrophysiologist: Allred ?Bi-V Pacing: 92%        ?03/28/2021 Weight: 172 lbs ?  ?AT/AF Burden: 4.0% (taking Eliquis) ?  ?      Spoke with patient and heart failure questions reviewed.  Pt had GI stomach virus this past few days causing possible dryness but she is starting to feel better.  ?   ?CorVue Thoracic impedance changed from possible fluid accumulation to possible dryness starting 3/31 but returning toward baseline.   ?  ?Prescribed:  ?Furosemide 80 mg Take 80 mg by mouth daily. Take and additional 80 mg if needed. ?Spirolactone 25 mg take 1 tablet at bedtime ?Farxiga 5 mg take 1 tablet daily ?  ?Labs: ?03/09/2021 Creatinine 1.50, BUN 22, Potassium 4.0, Sodium 139, GFR 36 ?10/17/2020 Creatinine 1.36, BUN 31, Potassium 5.1, Sodium 143, GFR 53 ?09/19/2020 Creatinine 1.19, BUN 27, Potassium 5.1, Sodium 139, GFR 62 ?08/29/2020 Creatinine 1.31, BUN 15, Potassium 3.7, Sodium 136, GFR 42 ?07/11/2020 Creatinine 1.13, BUN 12, Potassium 4.0, Sodium 139, GFR 50 ?06/27/2020 Creatinine 1.57, BUN 22, Potassium 4.8, Sodium 134, GFR 34  ?A complete set of results can be found in Results Review. ?  ?Recommendations:  Advised to increase fluid intake next 2 days to stay hydrated and call if experiencing changes in condition.  ?  ?Follow-up plan: ICM clinic phone appointment on 5/1.   91 day device clinic remote transmission 07/16/2021.  ?  ?EP/Cardiology Office Visits: 06/21/2021 with Dr. Domenic Polite.   Recall 06/06/2021 with [name], PA. ?  ?Copy of ICM check sent to Dr. [name].     ? ?3 month ICM trend: 04/25/2021. ? ? ? ?12-14 Month ICM trend:  ? ? ? ?[name], RN ?04/25/2021 ?8:23 AM ? ?

## 2021-05-21 ENCOUNTER — Ambulatory Visit (INDEPENDENT_AMBULATORY_CARE_PROVIDER_SITE_OTHER): Payer: Medicare Other

## 2021-05-21 DIAGNOSIS — I5022 Chronic systolic (congestive) heart failure: Secondary | ICD-10-CM

## 2021-05-21 DIAGNOSIS — Z95810 Presence of automatic (implantable) cardiac defibrillator: Secondary | ICD-10-CM | POA: Diagnosis not present

## 2021-05-22 NOTE — Progress Notes (Signed)
EPIC Encounter for ICM Monitoring ? ?Patient Name: Melissa Paul is a 76 y.o. female ?Date: 05/22/2021 ?Primary Care Physican: Melissa Balloon, FNP ?Primary Cardiologist: Melissa Paul ?Electrophysiologist: Melissa Paul ?Bi-V Pacing: 91%        ?03/28/2021 Weight: 172 lbs ?05/22/2021 Weight: 179 lbs (baseline 173 lbs) ? ?  ?AT/AF Burden: 4.0% (taking Eliquis) ?  ?      Spoke with patient and heart failure questions reviewed.  Pt symptomatic with 5 lb weight gain overnight, swelling in abdomen and legs, and chest fullness.  She limits salt and fluid intake. ?   ?CorVue Thoracic impedance possible fluid accumulation starting 4/26 and also 4/13-4/23.   ?  ?Prescribed:  ?Furosemide 80 mg Take 80 mg by mouth daily. Take and additional 80 mg if needed. ?Spirolactone 25 mg take 1 tablet at bedtime ?Farxiga 5 mg take 1 tablet daily ?  ?Labs: ?03/09/2021 Creatinine 1.50, BUN 22, Potassium 4.0, Sodium 139, GFR 36 ?10/17/2020 Creatinine 1.36, BUN 31, Potassium 5.1, Sodium 143, GFR 53 ?09/19/2020 Creatinine 1.19, BUN 27, Potassium 5.1, Sodium 139, GFR 62 ?08/29/2020 Creatinine 1.31, BUN 15, Potassium 3.7, Sodium 136, GFR 42 ?07/11/2020 Creatinine 1.13, BUN 12, Potassium 4.0, Sodium 139, GFR 50 ?06/27/2020 Creatinine 1.57, BUN 22, Potassium 4.8, Sodium 134, GFR 34  ?A complete set of results can be found in Results Review. ?  ?Recommendations:  Pt will take extra 40 mg Lasix in the afternoon x 2 days since 80 mg twice a day causes her to feel week.  ?  ?Follow-up plan: ICM clinic phone appointment on 05/28/2021 to recheck fluid level.   91 day device clinic remote transmission 07/16/2021.  ?  ?EP/Cardiology Office Visits: 06/21/2021 with Dr. Domenic Paul.   Recall 06/06/2021 with Melissa Kilts, PA. ?  ?Copy of ICM check sent to Dr. Rayann Paul and Dr Melissa Paul for review and recommendations if needed.    ? ?3 month ICM trend: 05/21/2021. ? ? ? ?12-14 Month ICM trend:  ? ? ? ?Melissa Billings, RN ?05/22/2021 ?3:55 PM ? ?

## 2021-05-26 ENCOUNTER — Other Ambulatory Visit: Payer: Self-pay | Admitting: Family

## 2021-05-28 ENCOUNTER — Ambulatory Visit (INDEPENDENT_AMBULATORY_CARE_PROVIDER_SITE_OTHER): Payer: Medicare Other

## 2021-05-28 DIAGNOSIS — I5022 Chronic systolic (congestive) heart failure: Secondary | ICD-10-CM

## 2021-05-28 DIAGNOSIS — Z95810 Presence of automatic (implantable) cardiac defibrillator: Secondary | ICD-10-CM

## 2021-05-28 NOTE — Progress Notes (Signed)
EPIC Encounter for ICM Monitoring ? ?Patient Name: Melissa Paul is a 76 y.o. female ?Date: 05/28/2021 ?Primary Care Physican: Sharion Balloon, FNP ?Primary Cardiologist: [facility] ?Electrophysiologist: Allred ?Bi-V Pacing: 91%        ?03/28/2021 Weight: 172 lbs ?05/22/2021 Weight: 179 lbs (baseline 173 lbs) ?05/30/2021 Weight: 175 lbs ?  ?  ?AT/AF Burden: 4.0% (taking Eliquis) ?  ?      Spoke with patient and heart failure questions reviewed.  Pt decrease in weight but not at baseline, swelling resolved but continues to feel tightness in chest when taking a deep breath.  She feels like she cannot get enough air in and is going to pass out.  She reports having weak lungs.  Advised to discuss at next OV.   ?   ?CorVue Thoracic impedance possible fluid levels returned to normal after taking extra Lasix 40 mg x 2 days.   ?  ?Prescribed:  ?Furosemide 80 mg Take 80 mg by mouth daily. Take and additional 80 mg if needed. ?Spirolactone 25 mg take 1 tablet at bedtime ?Farxiga 5 mg take 1 tablet daily ?  ?Labs: ?03/09/2021 Creatinine 1.50, BUN 22, Potassium 4.0, Sodium 139, GFR 36 ?10/17/2020 Creatinine 1.36, BUN 31, Potassium 5.1, Sodium 143, GFR 53 ?09/19/2020 Creatinine 1.19, BUN 27, Potassium 5.1, Sodium 139, GFR 62 ?08/29/2020 Creatinine 1.31, BUN 15, Potassium 3.7, Sodium 136, GFR 42 ?07/11/2020 Creatinine 1.13, BUN 12, Potassium 4.0, Sodium 139, GFR 50 ?06/27/2020 Creatinine 1.57, BUN 22, Potassium 4.8, Sodium 134, GFR 34  ?A complete set of results can be found in Results Review. ?  ?Recommendations:  No changes and encouraged to call if experiencing any fluid symptoms. ?  ?Follow-up plan: ICM clinic phone appointment on 06/20/2021 to recheck fluid levels before 6/1 OV.   91 day device clinic remote transmission 07/16/2021.  ?  ?EP/Cardiology Office Visits: 06/21/2021 with Dr. Domenic Polite.   Recall 06/06/2021 with [name], PA. ?  ?Copy of ICM check sent to Dr. [name]. ? ?3 month ICM trend: 05/28/2021. ? ? ? ?12-14  Month ICM trend:  ? ? ? ?[name], RN ?05/28/2021 ?9:45 AM ? ?

## 2021-05-28 NOTE — Telephone Encounter (Signed)
Patient needs follow up with [facility]. Patient was given a 90 day supply. ?

## 2021-05-28 NOTE — Telephone Encounter (Signed)
Patient is scheduled for med refill appt on 08/27/2021 ?

## 2021-06-16 ENCOUNTER — Other Ambulatory Visit: Payer: Self-pay | Admitting: Family

## 2021-06-16 DIAGNOSIS — G4700 Insomnia, unspecified: Secondary | ICD-10-CM

## 2021-06-16 DIAGNOSIS — F411 Generalized anxiety disorder: Secondary | ICD-10-CM

## 2021-06-20 ENCOUNTER — Ambulatory Visit (INDEPENDENT_AMBULATORY_CARE_PROVIDER_SITE_OTHER): Payer: Medicare Other

## 2021-06-20 ENCOUNTER — Telehealth: Payer: Self-pay

## 2021-06-20 DIAGNOSIS — Z95810 Presence of automatic (implantable) cardiac defibrillator: Secondary | ICD-10-CM

## 2021-06-20 DIAGNOSIS — I5022 Chronic systolic (congestive) heart failure: Secondary | ICD-10-CM

## 2021-06-20 NOTE — Progress Notes (Deleted)
Cardiology Office Note  Date: 06/20/2021   ID: Melissa Paul, DOB 05/01/1945, MRN [id number]  PCP:  Sharion Balloon, FNP  Cardiologist:  Rozann Lesches, MD Electrophysiologist:  Thompson Grayer, MD   No chief complaint on file.   History of Present Illness: Melissa Paul is a 76 y.o. female last seen in February.  [employer] Jude biventricular ICD in place with follow-up by Dr. [name].  Last device interrogation revealed normal function overall.  Past Medical History:  Diagnosis Date   Anxiety    Chronic systolic heart failure (HCC)    Constipation    Cyst of right kidney 11/12/2017   Glucose intolerance (pre-diabetes)    Hyperlipemia    Hypothyroidism    LBBB (left bundle branch block)    Mitral regurgitation    Mild to Moderate   Nonischemic cardiomyopathy (HCC)    LVEF 35-40% by echo 8/13   Paroxysmal atrial fibrillation (HCC)    Brief episodes by device interrogation   UTI (urinary tract infection)    Recurrent   Ventricular tachycardia    ICD therapy for VT    Past Surgical History:  Procedure Laterality Date   APPENDECTOMY     BIV ICD GENERATOR CHANGEOUT N/A 07/11/2016   Procedure: BiV ICD Generator Changeout;  Surgeon: Thompson Grayer, MD;  Location: [place] [facility];  Service: Cardiovascular;  Laterality: N/A;   CARDIAC DEFIBRILLATOR PLACEMENT  09/2009   St. Jude BiV ICD by Dr. Sandria Manly class III   CHOLECYSTECTOMY     has one ovary left     KNEE ARTHROSCOPY     Left   LEFT AND RIGHT HEART CATHETERIZATION WITH CORONARY ANGIOGRAM N/A 04/12/2014   Procedure: LEFT AND RIGHT HEART CATHETERIZATION WITH CORONARY ANGIOGRAM;  Surgeon: Leonie Man, MD;  Location: [facility] CATH [facility];  Service: Cardiovascular;  Laterality: N/A;   Left breast biopsy x 2     no maliganancy   PACEMAKER IMPLANT     RIGHT HEART CATH N/A 10/27/2020   Procedure: RIGHT HEART CATH;  Surgeon: [name], MD;  Location: [place] [facility];  Service: Cardiovascular;  Laterality: N/A;    RIGHT/LEFT HEART CATH AND CORONARY ANGIOGRAPHY N/A 03/26/2019   Procedure: RIGHT/LEFT HEART CATH AND CORONARY ANGIOGRAPHY;  Surgeon: [name], MD;  Location: [place] [facility];  Service: Cardiovascular;  Laterality: N/A;   TOTAL ABDOMINAL HYSTERECTOMY     (R) ovary removed    Current Outpatient Medications  Medication Sig Dispense Refill   acetaminophen (TYLENOL) 650 MG CR tablet Take 650 mg by mouth every 8 (eight) hours as needed for pain.     apixaban (ELIQUIS) 5 MG TABS tablet Take 1 tablet (5 mg total) by mouth 2 (two) times daily. Need to schedule an appointment for further refills 180 tablet 0   blood glucose meter kit and supplies Dispense based on patient and insurance preference. Use up to four times daily as directed. (FOR ICD-10 E10.9, E11.9). 1 each 0   carvedilol (COREG) 12.5 MG tablet Take 1 tablet (12.5 mg total) by mouth 2 (two) times daily. 180 tablet 3   ciprofloxacin (CIPRO) 500 MG tablet Take 1 tablet (500 mg total) by mouth 2 (two) times daily. 14 tablet 0   clobetasol ointment (TEMOVATE) 6.81 % Apply 1 application topically 2 (two) times daily. Apply 2-3 x weekly 30 g 3   dapagliflozin propanediol (FARXIGA) 10 MG TABS tablet Take 1 tablet (10 mg total) by mouth daily. 30 tablet 6  furosemide (LASIX) 80 MG tablet TAKE 1 TABLET BY MOUTH ONCE DAILY AS NEEDED FOR  EXTRA  FLUID 90 tablet 0   hydrOXYzine (VISTARIL) 25 MG capsule TAKE 1 CAPSULE BY MOUTH THREE TIMES DAILY 90 capsule 0   levothyroxine (EUTHYROX) 88 MCG tablet TAKE 1 TABLET BY MOUTH ONCE DAILY BEFORE BREAKFAST *NEED LABWORK* 90 tablet 4   metFORMIN (GLUCOPHAGE-XR) 750 MG 24 hr tablet Take 1 tablet (750 mg total) by mouth 2 (two) times daily. 180 tablet 3   pantoprazole (PROTONIX) 40 MG tablet TAKE 1 TABLET BY MOUTH ONCE DAILY BEFORE BREAKFAST 90 tablet 0   spironolactone (ALDACTONE) 25 MG tablet Take 1 tablet (25 mg total) by mouth daily. 90 tablet 3   Vericiguat (VERQUVO) 5 MG TABS Take 1.5 tablets (7.5 mg  total) by mouth daily at 12 noon. 45 tablet 6   No current facility-administered medications for this visit.   Allergies:  Buspar [buspirone], Lexapro [escitalopram oxalate], Trulicity [dulaglutide], Bactrim, Cephalexin, Chlorhexidine gluconate, Clindamycin, Contrast media [iodinated contrast media], Doxycycline, Latex, Levofloxacin, Nitrofurantoin monohyd macro, and Penicillins   Social History: The patient  reports that she quit smoking about 25 years ago. Her smoking use included cigarettes. She started smoking about 71 years ago. She has a 6.00 pack-year smoking history. She has never used smokeless tobacco. She reports that she does not drink alcohol and does not use drugs.   Family History: The patient's family history includes CVA in her mother; Cancer in her father; Heart Problems in her mother; Heart disease in her maternal grandfather and maternal grandmother.   ROS:  Please see the history of present illness. Otherwise, complete review of systems is positive for {NONE DEFAULTED:18576}.  All other systems are reviewed and negative.   Physical Exam: VS:  There were no vitals taken for this visit., BMI There is no height or weight on file to calculate BMI.  Wt Readings from Last 3 Encounters:  03/29/21 181 lb (82.1 kg)  03/15/21 181 lb (82.1 kg)  03/09/21 176 lb 6.4 oz (80 kg)    General: Patient appears comfortable at rest. HEENT: Conjunctiva and lids normal, oropharynx clear with moist mucosa. Neck: Supple, no elevated JVP or carotid bruits, no thyromegaly. Lungs: Clear to auscultation, nonlabored breathing at rest. Cardiac: Regular rate and rhythm, no S3 or significant systolic murmur, no pericardial rub. Abdomen: Soft, nontender, no hepatomegaly, bowel sounds present, no guarding or rebound. Extremities: No pitting edema, distal pulses 2+. Skin: Warm and dry. Musculoskeletal: No kyphosis. Neuropsychiatric: Alert and oriented x3, affect grossly appropriate.  ECG:  An ECG  dated 11/17/2020 was personally reviewed today and demonstrated:  Dual chamber pacing with PVC.  Recent Labwork: 10/16/2020: B Natriuretic Peptide 247.9 03/29/2021: ALT 7; AST 9; BUN 15; Creatinine, Ser 1.32; Hemoglobin 10.2; Platelets 271; Potassium 4.2; Sodium 139     Component Value Date/Time   CHOL 156 10/19/2018 1133   TRIG 89 10/19/2018 1133   HDL 45 10/19/2018 1133   CHOLHDL 3.5 10/19/2018 1133   CHOLHDL 3.3 04/11/2007 0600   VLDL 10 04/11/2007 0600   LDLCALC 94 10/19/2018 1133    Other Studies Reviewed Today:  Echocardiogram 11/17/2020:  1. Left ventricular ejection fraction, by estimation, is 25 to 30%. The  left ventricle has severely decreased function. The left ventricle  demonstrates global hypokinesis. The left ventricular internal cavity size  was moderately dilated. Left  ventricular diastolic parameters are consistent with Grade II diastolic  dysfunction (pseudonormalization).   2. Right ventricular systolic function is  mildly reduced. The right  ventricular size is mildly enlarged. There is normal pulmonary artery  systolic pressure. The estimated right ventricular systolic pressure is  36.4 mmHg.   3. Left atrial size was mildly dilated.   4. Right atrial size was mildly dilated.   5. The mitral valve is normal in structure. Mild mitral valve  regurgitation. No evidence of mitral stenosis.   6. The aortic valve is tricuspid. Aortic valve regurgitation is trivial.  No aortic stenosis is present.   7. The inferior vena cava is normal in size with greater than 50%  respiratory variability, suggesting right atrial pressure of 3 mmHg.   Assessment and Plan:    Medication Adjustments/Labs and Tests Ordered: Current medicines are reviewed at length with the patient today.  Concerns regarding medicines are outlined above.   Tests Ordered: No orders of the defined types were placed in this encounter.   Medication Changes: No orders of the defined types were  placed in this encounter.   Disposition:  Follow up {follow up:15908}  Signed, Satira Sark, MD, [facility] 06/20/2021 12:36 PM    [facility] [place], [place], [place] [address] Phone: [phone]; Fax: [phone]

## 2021-06-20 NOTE — Telephone Encounter (Signed)
Remote ICM transmission received.  Attempted call to patient regarding ICM remote transmission and no answer or voice mail option.  

## 2021-06-20 NOTE — Progress Notes (Signed)
EPIC Encounter for ICM Monitoring  Patient Name: Melissa Paul is a 76 y.o. female Date: 06/20/2021 Primary Care Physican: Sharion Balloon, FNP Primary Cardiologist: [facility] Electrophysiologist: Allred Bi-V Pacing: 91%        03/28/2021 Weight: 172 lbs 05/22/2021 Weight: 179 lbs (baseline 173 lbs) 05/30/2021 Weight: 175 lbs     AT/AF Burden: 4.5% (taking Eliquis)         Attempted call to patient and unable to reach.   Transmission reviewed.     CorVue Thoracic impedance normal but was suggesting possible fluid accumulation from 5/18-5/24.   Prescribed:  Furosemide 80 mg Take 80 mg by mouth daily. Take and additional 80 mg if needed. Spirolactone 25 mg take 1 tablet at bedtime Farxiga 5 mg take 1 tablet daily   Labs: 03/09/2021 Creatinine 1.50, BUN 22, Potassium 4.0, Sodium 139, GFR 36 10/17/2020 Creatinine 1.36, BUN 31, Potassium 5.1, Sodium 143, GFR 53 09/19/2020 Creatinine 1.19, BUN 27, Potassium 5.1, Sodium 139, GFR 62 08/29/2020 Creatinine 1.31, BUN 15, Potassium 3.7, Sodium 136, GFR 42 07/11/2020 Creatinine 1.13, BUN 12, Potassium 4.0, Sodium 139, GFR 50 06/27/2020 Creatinine 1.57, BUN 22, Potassium 4.8, Sodium 134, GFR 34  A complete set of results can be found in Results Review.   Recommendations:  Unable to reach.     Follow-up plan: ICM clinic phone appointment on 06/25/2021.   91 day device clinic remote transmission 07/16/2021.    EP/Cardiology Office Visits: 06/21/2021 with Dr. Domenic Polite.   Recall 06/06/2020 with [name], PA.   Copy of ICM check sent to Dr. [name].  3 month ICM trend: 06/19/2021.    12-14 Month ICM trend:     [name], RN 06/20/2021 12:59 PM

## 2021-06-21 ENCOUNTER — Ambulatory Visit: Payer: Medicare Other | Admitting: Cardiology

## 2021-06-27 ENCOUNTER — Ambulatory Visit (INDEPENDENT_AMBULATORY_CARE_PROVIDER_SITE_OTHER): Payer: Medicare Other

## 2021-06-27 DIAGNOSIS — I5022 Chronic systolic (congestive) heart failure: Secondary | ICD-10-CM | POA: Diagnosis not present

## 2021-06-27 DIAGNOSIS — Z95810 Presence of automatic (implantable) cardiac defibrillator: Secondary | ICD-10-CM

## 2021-06-28 NOTE — Progress Notes (Signed)
EPIC Encounter for ICM Monitoring  Patient Name: Melissa Paul is a 76 y.o. female Date: 06/28/2021 Primary Care Physican: Sharion Balloon, FNP Primary Cardiologist: [facility] Electrophysiologist: Allred Bi-V Pacing: 91%        03/28/2021 Weight: 172 lbs 05/22/2021 Weight: 179 lbs (baseline 173 lbs) 05/30/2021 Weight: 175 lbs     AT/AF Burden: 4.6% (taking Eliquis)         Transmission reviewed.     CorVue Thoracic impedance suggesting normal fluid levels.   Prescribed:  Furosemide 80 mg Take 80 mg by mouth daily. Take and additional 80 mg if needed. Spirolactone 25 mg take 1 tablet at bedtime Farxiga 5 mg take 1 tablet daily   Labs: 03/09/2021 Creatinine 1.50, BUN 22, Potassium 4.0, Sodium 139, GFR 36 10/17/2020 Creatinine 1.36, BUN 31, Potassium 5.1, Sodium 143, GFR 53 09/19/2020 Creatinine 1.19, BUN 27, Potassium 5.1, Sodium 139, GFR 62 08/29/2020 Creatinine 1.31, BUN 15, Potassium 3.7, Sodium 136, GFR 42 07/11/2020 Creatinine 1.13, BUN 12, Potassium 4.0, Sodium 139, GFR 50 06/27/2020 Creatinine 1.57, BUN 22, Potassium 4.8, Sodium 134, GFR 34  A complete set of results can be found in Results Review.   Recommendations:  No changes   Follow-up plan: ICM clinic phone appointment on 07/30/2021.   91 day device clinic remote transmission 07/16/2021.    EP/Cardiology Office Visits: 10/30/2021 with Dr. Domenic Polite.   Recall 06/06/2020 with [name], PA.   Copy of ICM check sent to Dr. [name].  3 month ICM trend: 06/27/2021.    12-14 Month ICM trend:     [name], RN 06/28/2021 9:34 AM

## 2021-07-09 ENCOUNTER — Ambulatory Visit: Payer: Medicare Other

## 2021-07-11 ENCOUNTER — Other Ambulatory Visit: Payer: Self-pay | Admitting: Family

## 2021-07-11 DIAGNOSIS — F411 Generalized anxiety disorder: Secondary | ICD-10-CM

## 2021-07-11 DIAGNOSIS — G4700 Insomnia, unspecified: Secondary | ICD-10-CM

## 2021-07-16 ENCOUNTER — Ambulatory Visit (INDEPENDENT_AMBULATORY_CARE_PROVIDER_SITE_OTHER): Payer: Medicare Other

## 2021-07-16 DIAGNOSIS — I5022 Chronic systolic (congestive) heart failure: Secondary | ICD-10-CM | POA: Diagnosis not present

## 2021-07-16 DIAGNOSIS — I428 Other cardiomyopathies: Secondary | ICD-10-CM

## 2021-07-18 ENCOUNTER — Other Ambulatory Visit: Payer: Self-pay | Admitting: Family Medicine

## 2021-07-18 ENCOUNTER — Telehealth: Payer: Self-pay

## 2021-07-18 ENCOUNTER — Ambulatory Visit (INDEPENDENT_AMBULATORY_CARE_PROVIDER_SITE_OTHER): Payer: Medicare Other

## 2021-07-18 VITALS — Ht 62.5 in | Wt 181.0 lb

## 2021-07-18 DIAGNOSIS — Z0000 Encounter for general adult medical examination without abnormal findings: Secondary | ICD-10-CM

## 2021-07-18 DIAGNOSIS — Z1231 Encounter for screening mammogram for malignant neoplasm of breast: Secondary | ICD-10-CM | POA: Diagnosis not present

## 2021-07-18 DIAGNOSIS — E1165 Type 2 diabetes mellitus with hyperglycemia: Secondary | ICD-10-CM

## 2021-07-18 LAB — CUP PACEART REMOTE DEVICE CHECK
Battery Remaining Longevity: 20 mo
Battery Remaining Percentage: 27 %
Battery Voltage: 2.87 V
Brady Statistic AP VP Percent: 86 %
Brady Statistic AP VS Percent: 2.5 %
Brady Statistic AS VP Percent: 5.2 %
Brady Statistic AS VS Percent: 1 %
Brady Statistic RA Percent Paced: 80 %
Date Time Interrogation Session: 20230626020016
HighPow Impedance: 64 Ohm
HighPow Impedance: 64 Ohm
Implantable Lead Implant Date: 20110912
Implantable Lead Implant Date: 20110912
Implantable Lead Implant Date: 20120710
Implantable Lead Location: 753858
Implantable Lead Location: 753859
Implantable Lead Location: 753860
Implantable Lead Model: 7122
Implantable Pulse Generator Implant Date: 20180621
Lead Channel Impedance Value: 310 Ohm
Lead Channel Impedance Value: 440 Ohm
Lead Channel Impedance Value: 480 Ohm
Lead Channel Pacing Threshold Amplitude: 0.5 V
Lead Channel Pacing Threshold Amplitude: 0.75 V
Lead Channel Pacing Threshold Amplitude: 0.75 V
Lead Channel Pacing Threshold Pulse Width: 0.5 ms
Lead Channel Pacing Threshold Pulse Width: 0.5 ms
Lead Channel Pacing Threshold Pulse Width: 0.5 ms
Lead Channel Sensing Intrinsic Amplitude: 11.8 mV
Lead Channel Sensing Intrinsic Amplitude: 2.4 mV
Lead Channel Setting Pacing Amplitude: 2 V
Lead Channel Setting Pacing Amplitude: 2 V
Lead Channel Setting Pacing Amplitude: 2 V
Lead Channel Setting Pacing Pulse Width: 0.5 ms
Lead Channel Setting Pacing Pulse Width: 0.5 ms
Lead Channel Setting Sensing Sensitivity: 0.5 mV
Pulse Gen Serial Number: 9761374

## 2021-07-18 MED ORDER — BLOOD GLUCOSE MONITOR KIT: PACK | 0 refills | Status: DC

## 2021-07-18 NOTE — Patient Instructions (Signed)
Melissa Paul , Thank you for taking time to come for your Medicare Wellness Visit. I appreciate your ongoing commitment to your health goals. Please review the following plan we discussed and let me know if I can assist you in the future.   Screening recommendations/referrals: Colonoscopy: No longer required due to age.  Mammogram: Order placed today. Bone Density: Done 11/15/2019 Repeat every 2 years  Recommended yearly ophthalmology/optometry visit for glaucoma screening and checkup Recommended yearly dental visit for hygiene and checkup  Vaccinations: Influenza vaccine: Due Fall 2023. Pneumococcal vaccine: Done 07/11/2009. [id number] available at local pharmacy or [facility]. Tdap vaccine: Due every 10 years. Shingles vaccine: Discussed. Available at local pharmacy.   Covid-19:Declined.  Advanced directives: Please bring a copy of your health care power of attorney and living will to the office to be added to your chart at your convenience.   Conditions/risks identified: Aim for 30 minutes of exercise or brisk walking, 6-8 glasses of water, and 5 servings of fruits and vegetables each day.   Next appointment: Follow up in one year for your annual wellness visit 2024.   Preventive Care 78 Years and Older, Female Preventive care refers to lifestyle choices and visits with your health care provider that can promote health and wellness. What does preventive care include? A yearly physical exam. This is also called an annual well check. Dental exams once or twice a year. Routine eye exams. Ask your health care provider how often you should have your eyes checked. Personal lifestyle choices, including: Daily care of your teeth and gums. Regular physical activity. Eating a healthy diet. Avoiding tobacco and drug use. Limiting alcohol use. Practicing safe sex. Taking low-dose aspirin every day. Taking vitamin and mineral supplements as recommended by your health care provider. What  happens during an annual well check? The services and screenings done by your health care provider during your annual well check will depend on your age, overall health, lifestyle risk factors, and family history of disease. Counseling  Your health care provider may ask you questions about your: Alcohol use. Tobacco use. Drug use. Emotional well-being. Home and relationship well-being. Sexual activity. Eating habits. History of falls. Memory and ability to understand (cognition). Work and work Statistician. Reproductive health. Screening  You may have the following tests or measurements: Height, weight, and BMI. Blood pressure. Lipid and cholesterol levels. These may be checked every 5 years, or more frequently if you are over 13 years old. Skin check. Lung cancer screening. You may have this screening every year starting at age 14 if you have a 30-pack-year history of smoking and currently smoke or have quit within the past 15 years. Fecal occult blood test (FOBT) of the stool. You may have this test every year starting at age 59. Flexible sigmoidoscopy or colonoscopy. You may have a sigmoidoscopy every 5 years or a colonoscopy every 10 years starting at age 6. Hepatitis C blood test. Hepatitis B blood test. Sexually transmitted disease (STD) testing. Diabetes screening. This is done by checking your blood sugar (glucose) after you have not eaten for a while (fasting). You may have this done every 1-3 years. Bone density scan. This is done to screen for osteoporosis. You may have this done starting at age 47. Mammogram. This may be done every 1-2 years. Talk to your health care provider about how often you should have regular mammograms. Talk with your health care provider about your test results, treatment options, and if necessary, the need for more tests. Vaccines  Your health care provider may recommend certain vaccines, such as: Influenza vaccine. This is recommended every  year. Tetanus, diphtheria, and acellular pertussis (Tdap, Td) vaccine. You may need a Td booster every 10 years. Zoster vaccine. You may need this after age 56. Pneumococcal 13-valent conjugate (PCV13) vaccine. One dose is recommended after age 83. Pneumococcal polysaccharide (PPSV23) vaccine. One dose is recommended after age 62. Talk to your health care provider about which screenings and vaccines you need and how often you need them. This information is not intended to replace advice given to you by your health care provider. Make sure you discuss any questions you have with your health care provider. Document Released: 02/03/2015 Document Revised: 09/27/2015 Document Reviewed: 11/08/2014 Elsevier Interactive Patient Education  2017 [place] Prevention in the Home Falls can cause injuries. They can happen to people of all ages. There are many things you can do to make your home safe and to help prevent falls. What can I do on the outside of my home? Regularly fix the edges of walkways and driveways and fix any cracks. Remove anything that might make you trip as you walk through a door, such as a raised step or threshold. Trim any bushes or trees on the path to your home. Use bright outdoor lighting. Clear any walking paths of anything that might make someone trip, such as rocks or tools. Regularly check to see if handrails are loose or broken. Make sure that both sides of any steps have handrails. Any raised decks and porches should have guardrails on the edges. Have any leaves, snow, or ice cleared regularly. Use sand or salt on walking paths during winter. Clean up any spills in your garage right away. This includes oil or grease spills. What can I do in the bathroom? Use night lights. Install grab bars by the toilet and in the tub and shower. Do not use towel bars as grab bars. Use non-skid mats or decals in the tub or shower. If you need to sit down in the shower, use a  plastic, non-slip stool. Keep the floor dry. Clean up any water that spills on the floor as soon as it happens. Remove soap buildup in the tub or shower regularly. Attach bath mats securely with double-sided non-slip rug tape. Do not have throw rugs and other things on the floor that can make you trip. What can I do in the bedroom? Use night lights. Make sure that you have a light by your bed that is easy to reach. Do not use any sheets or blankets that are too big for your bed. They should not hang down onto the floor. Have a firm chair that has side arms. You can use this for support while you get dressed. Do not have throw rugs and other things on the floor that can make you trip. What can I do in the kitchen? Clean up any spills right away. Avoid walking on wet floors. Keep items that you use a lot in easy-to-reach places. If you need to reach something above you, use a strong step stool that has a grab bar. Keep electrical cords out of the way. Do not use floor polish or wax that makes floors slippery. If you must use wax, use non-skid floor wax. Do not have throw rugs and other things on the floor that can make you trip. What can I do with my stairs? Do not leave any items on the stairs. Make sure that there are  handrails on both sides of the stairs and use them. Fix handrails that are broken or loose. Make sure that handrails are as long as the stairways. Check any carpeting to make sure that it is firmly attached to the stairs. Fix any carpet that is loose or worn. Avoid having throw rugs at the top or bottom of the stairs. If you do have throw rugs, attach them to the floor with carpet tape. Make sure that you have a light switch at the top of the stairs and the bottom of the stairs. If you do not have them, ask someone to add them for you. What else can I do to help prevent falls? Wear shoes that: Do not have high heels. Have rubber bottoms. Are comfortable and fit you  well. Are closed at the toe. Do not wear sandals. If you use a stepladder: Make sure that it is fully opened. Do not climb a closed stepladder. Make sure that both sides of the stepladder are locked into place. Ask someone to hold it for you, if possible. Clearly mark and make sure that you can see: Any grab bars or handrails. First and last steps. Where the edge of each step is. Use tools that help you move around (mobility aids) if they are needed. These include: Canes. Walkers. Scooters. Crutches. Turn on the lights when you go into a dark area. Replace any light bulbs as soon as they burn out. Set up your furniture so you have a clear path. Avoid moving your furniture around. If any of your floors are uneven, fix them. If there are any pets around you, be aware of where they are. Review your medicines with your doctor. Some medicines can make you feel dizzy. This can increase your chance of falling. Ask your doctor what other things that you can do to help prevent falls. This information is not intended to replace advice given to you by your health care provider. Make sure you discuss any questions you have with your health care provider. Document Released: 11/03/2008 Document Revised: 06/15/2015 Document Reviewed: 02/11/2014 Elsevier Interactive Patient Education  2017 [employer].

## 2021-07-18 NOTE — Telephone Encounter (Signed)
During AWV, pt states she is in need of a new Glucometer, Testing Strips and Lancets. Pt uses [facility] in [place]. Thank you!

## 2021-07-18 NOTE — Progress Notes (Signed)
Subjective:   Melissa Paul is a 76 y.o. female who presents for Medicare Annual (Subsequent) preventive examination. Virtual Visit via Telephone Note  I connected with  [name] on 07/18/21 at 10:30 AM EDT by telephone and verified that I am speaking with the correct person using two identifiers.  Location: Patient: HOME Provider: [facility] Persons participating in the virtual visit: patient/Nurse Health Advisor   I discussed the limitations, risks, security and privacy concerns of performing an evaluation and management service by telephone and the availability of in person appointments. The patient expressed understanding and agreed to proceed.  Interactive audio and video telecommunications were attempted between this nurse and patient, however failed, due to patient having technical difficulties OR patient did not have access to video capability.  We continued and completed visit with audio only.  Some vital signs may be absent or patient reported.   Chriss Driver, LPN  Review of Systems     Cardiac Risk Factors include: advanced age (>47mn, >>60women);diabetes mellitus;sedentary lifestyle;obesity (BMI >30kg/m2)     Objective:    Today's Vitals   07/18/21 1028 07/18/21 1030  Weight: 181 lb (82.1 kg)   Height: 5' 2.5" (1.588 m)   PainSc:  3    Body mass index is 32.58 kg/m.     07/18/2021   10:39 AM 10/27/2020    9:06 AM 07/06/2020   12:13 PM 02/22/2020    8:26 AM 09/10/2019    8:44 AM 08/24/2019   10:36 AM 04/21/2019    8:29 AM  Advanced Directives  Does Patient Have a Medical Advance Directive? Yes Yes Yes  Yes Yes No;Yes  Type of [place] will  Living will Living will Living will  Does patient want to make changes to medical advance directive?  No - Patient declined    No - Patient declined   Copy of [place] in Chart? No - copy requested No - copy requested No - copy requested      Would patient like information on creating a medical advance directive?    Yes (ED - Information included in AVS)   No - Patient declined    Current Medications (verified) Outpatient Encounter Medications as of 07/18/2021  Medication Sig   acetaminophen (TYLENOL) 650 MG CR tablet Take 650 mg by mouth every 8 (eight) hours as needed for pain.   apixaban (ELIQUIS) 5 MG TABS tablet Take 1 tablet (5 mg total) by mouth 2 (two) times daily. Need to schedule an appointment for further refills   blood glucose meter kit and supplies Dispense based on patient and insurance preference. Use up to four times daily as directed. (FOR ICD-10 E10.9, E11.9).   carvedilol (COREG) 12.5 MG tablet Take 1 tablet (12.5 mg total) by mouth 2 (two) times daily.   ciprofloxacin (CIPRO) 500 MG tablet Take 1 tablet (500 mg total) by mouth 2 (two) times daily.   clobetasol ointment (TEMOVATE) 04.03% Apply 1 application topically 2 (two) times daily. Apply 2-3 x weekly   dapagliflozin propanediol (FARXIGA) 10 MG TABS tablet Take 1 tablet (10 mg total) by mouth daily.   estradiol (ESTRACE) 0.1 MG/GM vaginal cream Place 1 g vaginally 2 (two) times a week.   furosemide (LASIX) 80 MG tablet TAKE 1 TABLET BY MOUTH ONCE DAILY AS NEEDED FOR  EXTRA  FLUID   hydrOXYzine (VISTARIL) 25 MG capsule  TAKE 1 CAPSULE BY MOUTH THREE TIMES DAILY   levothyroxine (EUTHYROX) 88 MCG tablet TAKE 1 TABLET BY MOUTH ONCE DAILY BEFORE BREAKFAST *NEED LABWORK*   metFORMIN (GLUCOPHAGE-XR) 750 MG 24 hr tablet Take 1 tablet (750 mg total) by mouth 2 (two) times daily.   pantoprazole (PROTONIX) 40 MG tablet TAKE 1 TABLET BY MOUTH ONCE DAILY BEFORE BREAKFAST   spironolactone (ALDACTONE) 25 MG tablet Take 1 tablet (25 mg total) by mouth daily.   Vericiguat (VERQUVO) 5 MG TABS Take 1.5 tablets (7.5 mg total) by mouth daily at 12 noon.   No facility-administered encounter medications on  file as of 07/18/2021.    Allergies (verified) Buspar [buspirone], Lexapro [escitalopram oxalate], Trulicity [dulaglutide], Bactrim, Cephalexin, Chlorhexidine gluconate, Clindamycin, Contrast media [iodinated contrast media], Doxycycline, Latex, Levofloxacin, Nitrofurantoin monohyd macro, and Penicillins   History: Past Medical History:  Diagnosis Date   Anxiety    Chronic systolic heart failure (HCC)    Constipation    Cyst of right kidney 11/12/2017   Glucose intolerance (pre-diabetes)    Hyperlipemia    Hypothyroidism    LBBB (left bundle branch block)    Mitral regurgitation    Mild to Moderate   Nonischemic cardiomyopathy ([place])    LVEF 35-40% by echo 8/13   Paroxysmal atrial fibrillation (HCC)    Brief episodes by device interrogation   UTI (urinary tract infection)    Recurrent   Ventricular tachycardia ([place])    ICD therapy for VT   Past Surgical History:  Procedure Laterality Date   APPENDECTOMY     BIV ICD GENERATOR CHANGEOUT N/A 07/11/2016   Procedure: BiV ICD Generator Changeout;  Surgeon: Thompson Grayer, MD;  Location: [place] [facility];  Service: Cardiovascular;  Laterality: N/A;   CARDIAC DEFIBRILLATOR PLACEMENT  09/2009   St. Jude BiV ICD by Dr. Sandria Manly class III   CHOLECYSTECTOMY     has one ovary left     KNEE ARTHROSCOPY     Left   LEFT AND RIGHT HEART CATHETERIZATION WITH CORONARY ANGIOGRAM N/A 04/12/2014   Procedure: LEFT AND RIGHT HEART CATHETERIZATION WITH CORONARY ANGIOGRAM;  Surgeon: Leonie Man, MD;  Location: [facility] C[facility];  Service: Cardiovascular;  Laterality: N/A;   Left breast biopsy x 2     no maliganancy   PACEMAKER IMPLANT     RIGHT HEART CATH N/A 10/27/2020   Procedure: RIGHT HEART CATH;  Surgeon: [name], MD;  Location: [place] [facility];  Service: Cardiovascular;  Laterality: N/A;   RIGHT/LEFT HEART CATH AND CORONARY ANGIOGRAPHY N/A 03/26/2019   Procedure: RIGHT/LEFT HEART CATH AND CORONARY ANGIOGRAPHY;  Surgeon: [name], MD;  Location: [place] [facility];  Service: Cardiovascular;  Laterality: N/A;   TOTAL ABDOMINAL HYSTERECTOMY     (R) ovary removed   Family History  Problem Relation Age of Onset   CVA Mother        multiple   Heart Problems Mother        "weak heart"   Heart disease Maternal Grandmother    Heart disease Maternal Grandfather    Cancer Father        Stomach and eshpogus   Diabetes Neg Hx    Hypertension Neg Hx    Coronary artery disease Neg Hx    Social History   Socioeconomic History   Marital status: Widowed    Spouse name: Not on file   Number of children: 2   Years of education: Not on file  Highest education level: Not on file  Occupational History   Not on file  Tobacco Use   Smoking status: Former    Packs/day: 0.30    Years: 20.00    Total pack years: 6.00    Types: Cigarettes    Start date: 08/03/1949    Quit date: 01/22/1996    Years since quitting: 25.5   Smokeless tobacco: Never  Vaping Use   Vaping Use: Never used  Substance and Sexual Activity   Alcohol use: No    Alcohol/week: 0.0 standard drinks of alcohol   Drug use: No   Sexual activity: Not Currently    Birth control/protection: Surgical    Comment: hyst  Other Topics Concern   Not on file  Social History Narrative   Her daughter, Terrence Dupont lives with her - takes her to all visits and cooks for her.   Social Determinants of Health   Financial Resource Strain: Low Risk  (07/18/2021)   Overall Financial Resource Strain (CARDIA)    Difficulty of Paying Living Expenses: Not hard at all  Food Insecurity: No Food Insecurity (07/18/2021)   Hunger Vital Sign    Worried About Running Out of Food in the Last Year: Never true    Ran Out of Food in the Last Year: Never true  Transportation Needs: No Transportation Needs (07/18/2021)   PRAPARE - Hydrologist (Medical): No    Lack of Transportation (Non-Medical): No  Physical Activity: Sufficiently Active (07/18/2021)    Exercise Vital Sign    Days of Exercise per Week: 5 days    Minutes of Exercise per Session: 30 min  Stress: No Stress Concern Present (07/18/2021)   [place]    Feeling of Stress : Not at all  Social Connections: [place] (07/18/2021)   Social Connection and Isolation Panel [NHANES]    Frequency of Communication with Friends and Family: More than three times a week    Frequency of Social Gatherings with Friends and Family: More than three times a week    Attends Religious Services: More than 4 times per year    Active Member of [employer] or Organizations: Yes    Attends Music therapist: More than 4 times per year    Marital Status: Married    Tobacco Counseling Counseling given: Not Answered   Clinical Intake:  Pre-visit preparation completed: Yes  Pain : 0-10 Pain Score: 3  Pain Type: Chronic pain Pain Location: Knee Pain Descriptors / Indicators: Aching, Discomfort Pain Onset: More than a month ago Pain Frequency: Intermittent     BMI - recorded: 32.58 Nutritional Status: BMI > 30  Obese Nutritional Risks: None Diabetes: Yes  How often do you need to have someone help you when you read instructions, pamphlets, or other written materials from your doctor or pharmacy?: 1 - Never  Diabetic?Nutrition Risk Assessment:  Has the patient had any N/V/D within the last 2 months?  No  Does the patient have any non-healing wounds?  No  Has the patient had any unintentional weight loss or weight gain?  No   Diabetes:  Is the patient diabetic?  Yes  If diabetic, was a CBG obtained today?  No  Did the patient bring in their glucometer from home?  No  How often do you monitor your CBG's? DAILY.   Financial Strains and Diabetes Management:  Are you having any financial strains with the device, your supplies or  your medication? No .  Does the patient want to be seen by Chronic Care  Management for management of their diabetes?  No  Would the patient like to be referred to a Nutritionist or for Diabetic Management?  No   Diabetic Exams:  Diabetic Eye Exam: Completed 06/2021. Pt has been advised about the importance in completing this exam.  Diabetic Foot Exam: Completed 04/26/2020. Pt has been advised about the importance in completing this exam.   Interpreter Needed?: No  Information entered by :: MJ Davonna Ertl, LPN   Activities of Daily Living    07/18/2021   10:43 AM  In your present state of health, do you have any difficulty performing the following activities:  Hearing? 1  Vision? 1  Difficulty concentrating or making decisions? 0  Walking or climbing stairs? 1  Dressing or bathing? 0  Doing errands, shopping? 1  Preparing Food and eating ? N  Using the Toilet? N  In the past six months, have you accidently leaked urine? N  Do you have problems with loss of bowel control? Y  Managing your Medications? N  Managing your Finances? N  Housekeeping or managing your Housekeeping? N    Patient Care Team: Sharion Balloon, FNP as PCP - General (Family Medicine) Thompson Grayer, MD as PCP - Electrophysiology (Cardiology) Satira Sark, MD as PCP - Cardiology (Cardiology) Lavera Guise, [facility] as Pharmacist (Family Medicine)  Indicate any recent Medical Services you may have received from other than [facility] providers in the past year (date may be approximate).     Assessment:   This is a routine wellness examination for [place].  Hearing/Vision screen Hearing Screening - Comments:: Some hearing issues.  Vision Screening - Comments:: Glasses. [facility] [place]. Recently dx with MD. 06/2021.  Dietary issues and exercise activities discussed: Current Exercise Habits: Home exercise routine, Type of exercise: stretching, Time (Minutes): 30, Frequency (Times/Week): 5, Weekly Exercise (Minutes/Week): 150, Intensity: Mild, Exercise limited by: cardiac  condition(s);orthopedic condition(s)   Goals Addressed             This Visit's Progress    Have 3 meals a day   On track    Prevent falls   On track      Depression Screen    07/18/2021   10:35 AM 10/31/2020   10:56 AM 09/28/2020   10:58 AM 09/22/2020   11:51 AM 08/29/2020   10:01 AM 07/06/2020   11:41 AM 06/27/2020   10:11 AM  PHQ 2/9 Scores  PHQ - 2 Score 0 0 0 1 0 1 0  PHQ- 9 Score  0  4  7     Fall Risk    07/18/2021   10:41 AM 03/15/2021    1:37 PM 02/01/2021   11:25 AM 10/31/2020   10:56 AM 10/17/2020   11:12 AM  Fall Risk   Falls in the past year? '1 1 1 1 1  ' Number falls in past yr: '1 1 1 1 1  ' Injury with Fall? 0 0 1 0 1  Risk for fall due to : History of fall(s);Impaired balance/gait;Impaired mobility   History of fall(s)   Follow up Falls prevention discussed   Education provided     [place]:  Any stairs in or around the home? Yes  If so, are there any without handrails? No  Home free of loose throw rugs in walkways, pet beds, electrical cords, etc? Yes  Adequate lighting in your home  to reduce risk of falls? Yes   ASSISTIVE DEVICES UTILIZED TO PREVENT FALLS:  Life alert? No  Use of a cane, walker or w/c? Yes  Grab bars in the bathroom? No  Shower chair or bench in shower? No  Elevated toilet seat or a handicapped toilet? No   TIMED UP AND GO:  Was the test performed? No .  Phone visit.   Cognitive Function:        07/18/2021   10:44 AM 04/21/2019    8:33 AM  6CIT Screen  What Year? 0 points 0 points  What month? 0 points 0 points  What time? 0 points 0 points  Count back from 20 0 points 0 points  Months in reverse 0 points 0 points  Repeat phrase 0 points   Total Score 0 points     Immunizations Immunization History  Administered Date(s) Administered   Influenza,inj,Quad PF,6+ Mos 11/12/2017   Influenza-Unspecified 11/11/2008, 01/08/2011   Pneumococcal Polysaccharide-23 07/11/2009    TDAP status:  Due, Education has been provided regarding the importance of this vaccine. Advised may receive this vaccine at local pharmacy or Health Dept. Aware to provide a copy of the vaccination record if obtained from local pharmacy or Health Dept. Verbalized acceptance and understanding.  Flu Vaccine status: Declined, Education has been provided regarding the importance of this vaccine but patient still declined. Advised may receive this vaccine at local pharmacy or Health Dept. Aware to provide a copy of the vaccination record if obtained from local pharmacy or Health Dept. Verbalized acceptance and understanding.  Pneumococcal vaccine status: Declined,  Education has been provided regarding the importance of this vaccine but patient still declined. Advised may receive this vaccine at local pharmacy or Health Dept. Aware to provide a copy of the vaccination record if obtained from local pharmacy or Health Dept. Verbalized acceptance and understanding.   Covid-19 vaccine status: Declined, Education has been provided regarding the importance of this vaccine but patient still declined. Advised may receive this vaccine at local pharmacy or Health Dept.or vaccine clinic. Aware to provide a copy of the vaccination record if obtained from local pharmacy or Health Dept. Verbalized acceptance and understanding.  Qualifies for Shingles Vaccine? Yes   Zostavax completed No   Shingrix Completed?: No.    Education has been provided regarding the importance of this vaccine. Patient has been advised to call insurance company to determine out of pocket expense if they have not yet received this vaccine. Advised may also receive vaccine at local pharmacy or Health Dept. Verbalized acceptance and understanding.  Screening Tests Health Maintenance  Topic Date Due   FOOT EXAM  04/26/2021   COVID-19 Vaccine (1) 08/03/2021 (Originally 02/04/1946)   Zoster Vaccines- Shingrix (1 of 2) 10/18/2021 (Originally 08/04/1964)   Pneumonia  Vaccine 72+ Years old (2 - PCV) 01/18/2022 (Originally 07/12/2010)   MAMMOGRAM  03/09/2022 (Originally 11/26/2016)   TETANUS/TDAP  07/19/2022 (Originally 08/04/1964)   INFLUENZA VACCINE  08/21/2021   HEMOGLOBIN A1C  09/06/2021   DEXA SCAN  11/14/2021   URINE MICROALBUMIN  03/09/2022   OPHTHALMOLOGY EXAM  07/05/2022   Hepatitis C Screening  Completed   HPV VACCINES  Aged Out    Health Maintenance  Health Maintenance Due  Topic Date Due   FOOT EXAM  04/26/2021    Colorectal cancer screening: No longer required.   Mammogram status: Ordered 07/18/2021. Pt provided with contact info and advised to call to schedule appt.   Bone Density status: Completed 11/15/2019. Results  reflect: Bone density results: OSTEOPOROSIS. Repeat every 2 years.  Lung Cancer Screening: (Low Dose CT Chest recommended if Age 85-80 years, 30 pack-year currently smoking OR have quit w/in 15years.) does not qualify.   Additional Screening:  Hepatitis C Screening: does qualify; Completed 05/07/2018  Vision Screening: Recommended annual ophthalmology exams for early detection of glaucoma and other disorders of the eye. Is the patient up to date with their annual eye exam?  Yes  Retinal specialist in [place]. Who is the provider or what is the name of the office in which the patient attends annual eye exams? Retinal  If pt is not established with a provider, would they like to be referred to a provider to establish care? No .   Dental Screening: Recommended annual dental exams for proper oral hygiene  Community Resource Referral / Chronic Care Management: CRR required this visit?  No   CCM required this visit?  No      Plan:     I have personally reviewed and noted the following in the patient's chart:   Medical and social history Use of alcohol, tobacco or illicit drugs  Current medications and supplements including opioid prescriptions.  Functional ability and status Nutritional status Physical  activity Advanced directives List of other physicians Hospitalizations, surgeries, and ER visits in previous 12 months Vitals Screenings to include cognitive, depression, and falls Referrals and appointments  In addition, I have reviewed and discussed with patient certain preventive protocols, quality metrics, and best practice recommendations. A written personalized care plan for preventive services as well as general preventive health recommendations were provided to patient.     Chriss Driver, LPN   04/22/270   Nurse Notes: Discussed Mammogram. Pt would like to schedule repeat. Order placed. Discussed Shingrix, Prevnar-20, Tdap and how to obtain.

## 2021-07-27 ENCOUNTER — Telehealth: Payer: Self-pay | Admitting: Family

## 2021-07-27 DIAGNOSIS — E1169 Type 2 diabetes mellitus with other specified complication: Secondary | ICD-10-CM

## 2021-07-27 DIAGNOSIS — E1165 Type 2 diabetes mellitus with hyperglycemia: Secondary | ICD-10-CM

## 2021-07-27 NOTE — Telephone Encounter (Signed)
Pt called stating that she cant get her Glucose Monitor and test kit filled because they need more info from our office.  I called [facility] in [place] and spoke with Dawn who explained that because pt has Medicare part B, they will only cover for pt to test 1x daily since she is not on insulin.   Also says that the orders cant say "use as directed." Has to be more specific.   Also says to make sure to list Diag codes on new Rx's.  One Touch Verio comes in quantity of 50 strips, 1x daily Or Lantus comes in quantity of 100 strips, 1x daily  Please correct and resend Rx's.

## 2021-07-29 ENCOUNTER — Other Ambulatory Visit: Payer: Self-pay | Admitting: Family

## 2021-07-30 ENCOUNTER — Ambulatory Visit (INDEPENDENT_AMBULATORY_CARE_PROVIDER_SITE_OTHER): Payer: Medicare Other

## 2021-07-30 DIAGNOSIS — Z95810 Presence of automatic (implantable) cardiac defibrillator: Secondary | ICD-10-CM

## 2021-07-30 DIAGNOSIS — I5022 Chronic systolic (congestive) heart failure: Secondary | ICD-10-CM

## 2021-07-30 MED ORDER — BLOOD GLUCOSE METER KIT: 1.0000 | PACK | Freq: Two times a day (BID) | 0 refills | Status: AC | PRN

## 2021-07-30 NOTE — Telephone Encounter (Signed)
Prescription sent to pharmacy.

## 2021-07-30 NOTE — Telephone Encounter (Signed)
Patient aware.

## 2021-08-02 NOTE — Progress Notes (Signed)
EPIC Encounter for ICM Monitoring  Patient Name: Melissa Paul is a 76 y.o. female Date: 08/02/2021 Primary Care Physican: Sharion Balloon, FNP Primary Cardiologist: [place] Electrophysiologist: [facility] Bi-V Pacing: 91%        03/28/2021 Weight: 172 lbs 05/22/2021 Weight: 179 lbs (baseline 173 lbs) 05/30/2021 Weight: 175 lbs 08/02/2021 Weight: 179-180 lbs      AT/AF Burden: 4.8% (taking Eliquis)         Spoke with patient and heart failure questions reviewed.  Pt has dry cough, SOB, and swelling in feet and tight and typically takes extra Torsemide for symptoms.      CorVue Thoracic impedance suggesting intermittent days with possible fluid accumulation.   Prescribed:  Furosemide 80 mg Take 80 mg by mouth daily. Take and additional 80 mg if needed. Spirolactone 25 mg take 1 tablet at bedtime Farxiga 5 mg take 1 tablet daily   Labs: 03/09/2021 Creatinine 1.50, BUN 22, Potassium 4.0, Sodium 139, GFR 36 10/17/2020 Creatinine 1.36, BUN 31, Potassium 5.1, Sodium 143, GFR 53 09/19/2020 Creatinine 1.19, BUN 27, Potassium 5.1, Sodium 139, GFR 62 08/29/2020 Creatinine 1.31, BUN 15, Potassium 3.7, Sodium 136, GFR 42 07/11/2020 Creatinine 1.13, BUN 12, Potassium 4.0, Sodium 139, GFR 50 06/27/2020 Creatinine 1.57, BUN 22, Potassium 4.8, Sodium 134, GFR 34  A complete set of results can be found in Results Review.   Recommendations:  She adjusts Torsemide as needed base on symptoms.    Follow-up plan: ICM clinic phone appointment on 09/03/2021.   91 day device clinic remote transmission 10/15/2021.    EP/Cardiology Office Visits: 10/30/2021 with Dr. Domenic Polite.   08/24/2021 with Dr [name].   Copy of ICM check sent to Dr. [name].   3 month ICM trend: 07/30/2021.    12-14 Month ICM trend:     [name], RN 08/02/2021 1:56 PM

## 2021-08-08 NOTE — Progress Notes (Signed)
Remote ICD transmission.   

## 2021-08-19 ENCOUNTER — Other Ambulatory Visit (HOSPITAL_COMMUNITY): Payer: Self-pay | Admitting: Cardiology

## 2021-08-19 ENCOUNTER — Other Ambulatory Visit: Payer: Self-pay | Admitting: Family

## 2021-08-19 ENCOUNTER — Other Ambulatory Visit (HOSPITAL_COMMUNITY): Payer: Self-pay | Admitting: Internal Medicine

## 2021-08-24 ENCOUNTER — Encounter: Payer: Self-pay | Admitting: Internal Medicine

## 2021-08-24 ENCOUNTER — Ambulatory Visit (INDEPENDENT_AMBULATORY_CARE_PROVIDER_SITE_OTHER): Payer: Medicare Other | Admitting: Internal Medicine

## 2021-08-24 VITALS — BP 106/58 | HR 64 | Ht 62.5 in | Wt 180.6 lb

## 2021-08-24 DIAGNOSIS — I480 Paroxysmal atrial fibrillation: Secondary | ICD-10-CM

## 2021-08-24 DIAGNOSIS — I5022 Chronic systolic (congestive) heart failure: Secondary | ICD-10-CM | POA: Diagnosis not present

## 2021-08-24 DIAGNOSIS — I5020 Unspecified systolic (congestive) heart failure: Secondary | ICD-10-CM

## 2021-08-24 DIAGNOSIS — I4901 Ventricular fibrillation: Secondary | ICD-10-CM

## 2021-08-24 DIAGNOSIS — I493 Ventricular premature depolarization: Secondary | ICD-10-CM | POA: Diagnosis not present

## 2021-08-24 DIAGNOSIS — D6869 Other thrombophilia: Secondary | ICD-10-CM

## 2021-08-24 NOTE — Progress Notes (Signed)
PCP: Sharion Balloon, FNP Primary Cardiologist: Dr Aundra Dubin Primary EP: Dr Rogue Jury Melissa Paul is a 76 y.o. female who presents today for routine electrophysiology followup.  Since last being seen in our clinic, the patient reports doing reasonably well.  She is not very active.  Her primary concerns are with neuropathy and difficulty sleeping.  She also has chronic knee pain.  She has SOB with moderate activity.  Today, she denies symptoms of palpitations, exertional chest pain,   dizziness, presyncope, syncope, or ICD shocks.  The patient is otherwise without complaint today.   Past Medical History:  Diagnosis Date   Anxiety    Chronic systolic heart failure (HCC)    Constipation    Cyst of right kidney 11/12/2017   Glucose intolerance (pre-diabetes)    Hyperlipemia    Hypothyroidism    LBBB (left bundle branch block)    Mitral regurgitation    Mild to Moderate   Nonischemic cardiomyopathy (HCC)    LVEF 35-40% by echo 8/13   Paroxysmal atrial fibrillation (HCC)    Brief episodes by device interrogation   UTI (urinary tract infection)    Recurrent   Ventricular tachycardia ([place])    ICD therapy for VT   Past Surgical History:  Procedure Laterality Date   APPENDECTOMY     BIV ICD GENERATOR CHANGEOUT N/A 07/11/2016   Procedure: BiV ICD Generator Changeout;  Surgeon: Thompson Grayer, MD;  Location: [facility];  Service: Cardiovascular;  Laterality: N/A;   CARDIAC DEFIBRILLATOR PLACEMENT  09/2009   St. Jude BiV ICD by Dr. Sandria Manly class III   CHOLECYSTECTOMY     has one ovary left     KNEE ARTHROSCOPY     Left   LEFT AND RIGHT HEART CATHETERIZATION WITH CORONARY ANGIOGRAM N/A 04/12/2014   Procedure: LEFT AND RIGHT HEART CATHETERIZATION WITH CORONARY ANGIOGRAM;  Surgeon: Leonie Man, MD;  Location: [facility] CATH LAB;  Service: Cardiovascular;  Laterality: N/A;   Left breast biopsy x 2     no maliganancy   PACEMAKER IMPLANT     RIGHT HEART CATH N/A 10/27/2020    Procedure: RIGHT HEART CATH;  Surgeon: [name], MD;  Location: [facility];  Service: Cardiovascular;  Laterality: N/A;   RIGHT/LEFT HEART CATH AND CORONARY ANGIOGRAPHY N/A 03/26/2019   Procedure: RIGHT/LEFT HEART CATH AND CORONARY ANGIOGRAPHY;  Surgeon: [name], MD;  Location: [facility];  Service: Cardiovascular;  Laterality: N/A;   TOTAL ABDOMINAL HYSTERECTOMY     (R) ovary removed    ROS- all systems are reviewed and negative except as per HPI above  Current Outpatient Medications  Medication Sig Dispense Refill   acetaminophen (TYLENOL) 650 MG CR tablet Take 650 mg by mouth every 8 (eight) hours as needed for pain.     apixaban (ELIQUIS) 5 MG TABS tablet Take 1 tablet (5 mg total) by mouth 2 (two) times daily. Need to schedule an appointment for further refills 180 tablet 0   blood glucose meter kit and supplies KIT Dispense based on patient and insurance preference. Use up to four times daily as directed. 1 each 0   blood glucose meter kit and supplies 1 each by Other route 2 (two) times daily as needed for other. Dispense based on patient and insurance preference. . (FOR ICD-10 E10.9, E11.9).Dispense based on patient and insurance preference. 1 each 0   carvedilol (COREG) 12.5 MG tablet Take 1 tablet by mouth twice daily 180  tablet 3   ciprofloxacin (CIPRO) 500 MG tablet Take 1 tablet (500 mg total) by mouth 2 (two) times daily. 14 tablet 0   clobetasol ointment (TEMOVATE) 7.90 % Apply 1 application topically 2 (two) times daily. Apply 2-3 x weekly 30 g 3   dapagliflozin propanediol (FARXIGA) 10 MG TABS tablet Take 1 tablet (10 mg total) by mouth daily. 30 tablet 6   estradiol (ESTRACE) 0.1 MG/GM vaginal cream Place 1 g vaginally 2 (two) times a week.     furosemide (LASIX) 80 MG tablet TAKE 1 TABLET BY MOUTH ONCE DAILY AS NEEDED FOR  EXTRA  FLUID 90 tablet 0   hydrOXYzine (VISTARIL) 25 MG capsule TAKE 1 CAPSULE BY MOUTH THREE TIMES DAILY 90 capsule 1    levothyroxine (SYNTHROID) 88 MCG tablet TAKE 1 TABLET BY MOUTH ONCE DAILY BEFORE BREAKFAST 30 tablet 0   metFORMIN (GLUCOPHAGE-XR) 750 MG 24 hr tablet Take 1 tablet (750 mg total) by mouth 2 (two) times daily. 180 tablet 3   pantoprazole (PROTONIX) 40 MG tablet TAKE 1 TABLET BY MOUTH ONCE DAILY BEFORE BREAKFAST 90 tablet 0   spironolactone (ALDACTONE) 25 MG tablet Take 1 tablet by mouth once daily 90 tablet 11   Vericiguat (VERQUVO) 5 MG TABS Take 1.5 tablets (7.5 mg total) by mouth daily at 12 noon. 45 tablet 6   No current facility-administered medications for this visit.    Physical Exam: Vitals:   08/24/21 1423  BP: (!) 106/58  Pulse: 64  SpO2: 96%  Weight: 180 lb 9.6 oz (81.9 kg)  Height: 5' 2.5" (1.588 m)    GEN- The patient is well appearing, alert and oriented x 3 today.   Head- normocephalic, atraumatic Eyes-  Sclera clear, conjunctiva pink Ears- hearing intact Oropharynx- clear with poor dentition Lungs- Clear to ausculation bilaterally, normal work of breathing Chest- ICD pocket is well healed Heart- Regular rate and rhythm, no murmurs, rubs or gallops, PMI not laterally displaced GI- soft, NT, ND, + BS Extremities- no clubbing, cyanosis, or edema  ICD interrogation- reviewed in detail today,  See PACEART report    Wt Readings from Last 3 Encounters:  08/24/21 180 lb 9.6 oz (81.9 kg)  07/18/21 181 lb (82.1 kg)  03/29/21 181 lb (82.1 kg)    Assessment and Plan:  1.  Chronic systolic dysfunction/ nonischemic CM euvolemic today Stable on an appropriate medical regimen Normal BiV ICD function She has some noise noted on her atrial lead.  I have adjusted her atrial sensitivity today.  I have also reduce her PVARP due to noncompetitive atrial pacing. 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  We have tried multiple strategies previously including overdrive  suppression without resolution.  Continue to follow clinically  2. Prior VT/VF Controlled No changes  3. Afib Well controlled On eliquis for chads2vasc score of 6  Labs 03/29/21 reviewed  Risks, benefits and potential toxicities for medications prescribed and/or refilled reviewed with patient today.   Follow-up with Dr Domenic Polite as scheduled Return in a year  Thompson Grayer MD, [facility] 08/24/2021 2:43 PM

## 2021-08-24 NOTE — Patient Instructions (Signed)
Medication Instructions:  ?Continue all current medications. ? ?Labwork: ?none ? ?Testing/Procedures: ?none ? ?Follow-Up: ?Your physician wants you to follow up in:  1 year.  You should receive a call from the office when due.  If you don't receive this call, please call our office to schedule the follow up appointment   ? ?Any Other Special Instructions Will Be Listed Below (If Applicable). ? ? ?If you need a refill on your cardiac medications before your next appointment, please call your pharmacy. ? ?

## 2021-08-27 ENCOUNTER — Encounter: Payer: Self-pay | Admitting: Family

## 2021-08-27 ENCOUNTER — Ambulatory Visit (INDEPENDENT_AMBULATORY_CARE_PROVIDER_SITE_OTHER): Payer: Medicare Other | Admitting: Family

## 2021-08-27 VITALS — BP 105/58 | HR 71 | Temp 98.0°F | Ht 62.5 in | Wt 183.8 lb

## 2021-08-27 DIAGNOSIS — E039 Hypothyroidism, unspecified: Secondary | ICD-10-CM

## 2021-08-27 DIAGNOSIS — I080 Rheumatic disorders of both mitral and aortic valves: Secondary | ICD-10-CM

## 2021-08-27 DIAGNOSIS — E1165 Type 2 diabetes mellitus with hyperglycemia: Secondary | ICD-10-CM

## 2021-08-27 DIAGNOSIS — I5023 Acute on chronic systolic (congestive) heart failure: Secondary | ICD-10-CM

## 2021-08-27 DIAGNOSIS — E1142 Type 2 diabetes mellitus with diabetic polyneuropathy: Secondary | ICD-10-CM

## 2021-08-27 DIAGNOSIS — F411 Generalized anxiety disorder: Secondary | ICD-10-CM

## 2021-08-27 DIAGNOSIS — I480 Paroxysmal atrial fibrillation: Secondary | ICD-10-CM | POA: Diagnosis not present

## 2021-08-27 DIAGNOSIS — N1832 Chronic kidney disease, stage 3b: Secondary | ICD-10-CM

## 2021-08-27 MED ORDER — GABAPENTIN 100 MG PO CAPS: 100.0000 mg | ORAL_CAPSULE | Freq: Three times a day (TID) | ORAL | 1 refills | Status: DC

## 2021-08-27 NOTE — Patient Instructions (Signed)
Diabetic Neuropathy Diabetic neuropathy refers to nerve damage that is caused by diabetes. Over time, people with diabetes can develop nerve damage throughout the body. There are several types of diabetic neuropathy: Peripheral neuropathy. This is the most common type of diabetic neuropathy. It damages the nerves that carry signals between the spinal cord and other parts of the body (peripheral nerves). This usually affects nerves in the feet, legs, hands, and arms. Autonomic neuropathy. This type causes damage to nerves that control involuntary functions (autonomic nerves). Involuntary functions are functions of the body that you do not control. They include heartbeat, body temperature, blood pressure, urination, digestion, sweating, sexual function, or response to changes in blood glucose. Focal neuropathy. This type of nerve damage affects one area of the body, such as an arm, a leg, or the face. The injury may involve one nerve or a small group of nerves. Focal neuropathy can be painful and unpredictable. It occurs most often in older adults with diabetes. This often develops suddenly, but usually improves over time and does not cause long-term problems. Proximal neuropathy. This type of nerve damage affects the nerves of the thighs, hips, buttocks, or legs. It causes severe pain, weakness, and muscle death (atrophy), usually in the thigh muscles. It is more common among older men and people who have type 2 diabetes. The length of recovery time may vary. What are the causes? Peripheral, autonomic, and focal neuropathies are caused by diabetes that is not well controlled with treatment. The cause of proximal neuropathy is not known, but it may be caused by inflammation related to uncontrolled blood glucose levels. What are the signs or symptoms? Peripheral neuropathy Peripheral neuropathy develops slowly over time. When the nerves of the feet and legs no longer work, you may experience: Burning,  stabbing, or aching pain in the legs or feet. Pain or cramping in the legs or feet. Loss of feeling (numbness) and inability to feel pressure or pain in the feet. This can lead to: Thick calluses or sores on areas of constant pressure. Ulcers. Reduced ability to feel temperature changes. Foot deformities. Muscle weakness. Loss of balance or coordination. Autonomic neuropathy The symptoms of autonomic neuropathy vary depending on which nerves are affected. Symptoms may include: Problems with digestion, such as: Nausea or vomiting. Poor appetite. Bloating. Diarrhea or constipation. Trouble swallowing. Losing weight without trying to. Problems with the heart, blood, and lungs, such as: Dizziness, especially when standing up. Fainting. Shortness of breath. Irregular heartbeat. Bladder problems, such as: Trouble starting or stopping urination. Leaking urine. Trouble emptying the bladder. Urinary tract infections (UTIs). Problems with other body functions, such as: Sweat. You may sweat too much or too little. Temperature. You might get hot easily. Or, you might feel cold more than usual. Sexual function. Men may not be able to get or maintain an erection. Women may have vaginal dryness and difficulty with arousal. Focal neuropathy Symptoms affect only one area of the body. Common symptoms include: Numbness. Tingling. Burning pain. Prickling feeling. Very sensitive skin. Weakness. Inability to move (paralysis). Muscle twitching. Muscles getting smaller (wasting). Poor coordination. Double or blurred vision. Proximal neuropathy Sudden, severe pain in the hip, thigh, or buttocks. Pain may spread from the back into the legs (sciatica). Pain and numbness in the arms and legs. Tingling. Loss of bladder control or bowel control. Weakness and wasting of thigh muscles. Difficulty getting up from a seated position. Abdominal swelling. Unexplained weight loss. How is this  diagnosed? Diagnosis varies depending on the type   of neuropathy your health care provider suspects. Peripheral neuropathy Your health care provider will do a neurologic exam. This exam checks your reflexes, how you move, and what you can feel. You may have other tests, such as: Blood tests. Tests of the fluid that surrounds the spinal cord (lumbar puncture). CT scan. MRI. Checking the nerves that control muscles (electromyogram, or EMG). Checking how quickly signals pass through your nerves (nerve conduction study). Checking a small piece of a nerve using a microscope (biopsy). Autonomic neuropathy You may have tests, such as: Tests to measure your blood pressure and heart rate. You may be secured to an exam table that moves you from a lying position to an upright position (table tilt test). Breathing tests to check your lungs. Tests to check how food moves through the digestive system (gastric emptying tests). Blood, sweat, or urine tests. Ultrasound of your bladder. Spinal fluid tests. Focal neuropathy This condition may be diagnosed with: A neurologic exam. CT scan. MRI. EMG. Nerve conduction study. Proximal neuropathy There is no test to diagnose this type of neuropathy. You may have tests to rule out other possible causes of this type of neuropathy. Tests may include: X-rays of your spine and lumbar region. Lumbar puncture. MRI. How is this treated? The goal of treatment is to keep nerve damage from getting worse. Treatment may include: Following your diabetes management plan. This will help keep your blood glucose level and your A1C level within your target range. This is the most important treatment. Using prescription pain medicine. Follow these instructions at home: Diabetes management Follow your diabetes management plan as told by your health care provider. Check your blood glucose levels. Keep your blood glucose in your target range. Have your A1C level checked at  least two times a year, or as often as told. Take over the counter and prescription medicines only as told by your health care provider. This includes insulin and diabetes medicine.  Lifestyle  Do not use any products that contain nicotine or tobacco, such as cigarettes, e-cigarettes, and chewing tobacco. If you need help quitting, ask your health care provider. Be physically active every day. Include strength training and balance exercises. Follow a healthy meal plan. Work with your health care provider to manage your blood pressure. General instructions Ask your health care provider if the medicine prescribed to you requires you to avoid driving or using machinery. Check your skin and feet every day for cuts, bruises, redness, blisters, or sores. Keep all follow-up visits. This is important. Contact a health care provider if: You have burning, stabbing, or aching pain in your legs or feet. You are unable to feel pressure or pain in your feet. You develop problems with digestion, such as: Nausea. Vomiting. Bloating. Constipation. Diarrhea. Abdominal pain. You have difficulty with urination, such as: Inability to control when you urinate (incontinence). Inability to completely empty the bladder (retention). You feel as if your heart is racing (palpitations). You feel dizzy, weak, or faint when you stand up. Get help right away if: You cannot urinate. You have sudden weakness or loss of coordination. You have trouble speaking. You have pain or pressure in your chest. You have an irregular heartbeat. You have sudden inability to move a part of your body. These symptoms may represent a serious problem that is an emergency. Do not wait to see if the symptoms will go away. Get medical help right away. Call your local emergency services (911 in the [place] Do not drive yourself to   the hospital. Summary Diabetic neuropathy is nerve damage that is caused by diabetes. It can cause numbness  and pain in the arms, legs, digestive tract, heart, and other body systems. This condition is treated by keeping your blood glucose level and your A1C level within your target range. This can help prevent neuropathy from getting worse. Check your skin and feet every day for cuts, bruises, redness, blisters, or sores. Do not use any products that contain nicotine or tobacco, such as cigarettes, e-cigarettes, and chewing tobacco. This information is not intended to replace advice given to you by your health care provider. Make sure you discuss any questions you have with your health care provider. Document Revised: 05/20/2019 Document Reviewed: 05/20/2019 Elsevier Patient Education  [place].

## 2021-08-27 NOTE — Progress Notes (Signed)
Subjective:    Patient ID: Melissa Paul, female    DOB: 06-02-1945, 76 y.o.   MRN: [id number]  Chief Complaint  Patient presents with   Medical Management of Chronic Issues   Pt presents to the office today for chronic follow up. She is followed by Cardiologists for A Fib, CHF, and cardiomyopathy. And has a pacemaker.    She had a right heart cath on 10/27/20. She reports she is doing well.    Complaining of neuropathy in bilateral legs.   Has CKD and avoids NSAID's.  Diabetes She presents for her follow-up diabetic visit. She has type 2 diabetes mellitus. Hypoglycemia symptoms include nervousness/anxiousness. Associated symptoms include blurred vision, fatigue and foot paresthesias. Symptoms are stable. Diabetic complications include heart disease, nephropathy and peripheral neuropathy. Risk factors for coronary artery disease include dyslipidemia, diabetes mellitus, hypertension, sedentary lifestyle and post-menopausal. She is following a generally healthy diet. Her overall blood glucose range is 90-110 mg/dl. Eye exam is current.  Hypertension This is a chronic problem. The current episode started more than 1 year ago. The problem has been resolved since onset. Associated symptoms include anxiety and blurred vision. Pertinent negatives include no peripheral edema or shortness of breath. Risk factors for coronary artery disease include diabetes mellitus, obesity, dyslipidemia and sedentary lifestyle. The current treatment provides moderate improvement. Identifiable causes of hypertension include a thyroid problem.  Congestive Heart Failure Presents for follow-up visit. Associated symptoms include edema and fatigue. Pertinent negatives include no nocturia or shortness of breath. The symptoms have been stable.  Thyroid Problem Presents for follow-up visit. Symptoms include anxiety, constipation and fatigue. Patient reports no diarrhea, dry skin or hoarse voice.  Anxiety Symptoms include  nervous/anxious behavior. Patient reports no shortness of breath.    Constipation This is a chronic problem. The current episode started more than 1 year ago. The problem has been resolved since onset. Her stool frequency is 4 to 5 times per week. Pertinent negatives include no diarrhea. Risk factors include obesity. She has tried laxatives for the symptoms. The treatment provided moderate relief.      Review of Systems  Constitutional:  Positive for fatigue.  HENT:  Negative for hoarse voice.   Eyes:  Positive for blurred vision.  Respiratory:  Negative for shortness of breath.   Gastrointestinal:  Positive for constipation. Negative for diarrhea.  Genitourinary:  Negative for nocturia.  Psychiatric/Behavioral:  The patient is nervous/anxious.   All other systems reviewed and are negative.      Objective:   Physical Exam Vitals reviewed.  Constitutional:      General: She is not in acute distress.    Appearance: She is well-developed. She is obese.  HENT:     Head: Normocephalic and atraumatic.     Right Ear: Tympanic membrane normal.     Left Ear: Tympanic membrane normal.  Eyes:     Pupils: Pupils are equal, round, and reactive to light.  Neck:     Thyroid: No thyromegaly.  Cardiovascular:     Rate and Rhythm: Normal rate and regular rhythm.     Heart sounds: Normal heart sounds. No murmur heard. Pulmonary:     Effort: Pulmonary effort is normal. No respiratory distress.     Breath sounds: Normal breath sounds. No wheezing.     Comments: Diminished Abdominal:     General: Bowel sounds are normal. There is no distension.     Palpations: Abdomen is soft.     Tenderness: There is no  abdominal tenderness.  Musculoskeletal:        General: No tenderness. Normal range of motion.     Cervical back: Normal range of motion and neck supple.     Right lower leg: Edema (trace) present.     Left lower leg: Edema (trace) present.  Skin:    General: Skin is warm and dry.   Neurological:     General: No focal deficit present.     Mental Status: She is alert and oriented to person, place, and time.     Cranial Nerves: No cranial nerve deficit.     Gait: Gait abnormal (using cane).     Deep Tendon Reflexes: Reflexes are normal and symmetric.  Psychiatric:        Behavior: Behavior normal.        Thought Content: Thought content normal.        Judgment: Judgment normal.       BP (!) 105/58   Pulse 71   Temp 98 F (36.7 C) (Temporal)   Ht 5' 2.5" (1.588 m)   Wt 183 lb 12.8 oz (83.4 kg)   SpO2 99%   BMI 33.08 kg/m      Assessment & Plan:  Melissa Paul comes in today with chief complaint of Medical Management of Chronic Issues   Diagnosis and orders addressed:  1. Type 2 diabetes mellitus with hyperglycemia, without long-term current use of insulin (HCC) - CMP14+EGFR - CBC with Differential/Platelet - Bayer DCA Hb A1c Waived  2. Hypothyroidism, unspecified type - CMP14+EGFR - CBC with Differential/Platelet - TSH  3. MITRAL REGURGITATION - CMP14+EGFR - CBC with Differential/Platelet  4. Paroxysmal atrial fibrillation (HCC) - CMP14+EGFR - CBC with Differential/Platelet  5. Acute on chronic systolic ACC/AHA stage C congestive heart failure (HCC - CMP14+EGFR - CBC with Differential/Platelet  6. GAD (generalized anxiety disorder) - CMP14+EGFR - CBC with Differential/Platelet  7. Stage 3b chronic kidney disease ([place]) - CMP14+EGFR - CBC with Differential/Platelet  8. Diabetic polyneuropathy associated with type 2 diabetes mellitus (HCC) Start gabapentin 100 mg TID  - gabapentin (NEURONTIN) 100 MG capsule; Take 1 capsule (100 mg total) by mouth 3 (three) times daily.  Dispense: 90 capsule; Refill: 1   Labs pending Health Maintenance reviewed Diet and exercise encouraged  Follow up plan: 3 months    Evelina Dun, FNP

## 2021-09-03 ENCOUNTER — Ambulatory Visit (INDEPENDENT_AMBULATORY_CARE_PROVIDER_SITE_OTHER): Payer: Medicare Other

## 2021-09-03 DIAGNOSIS — Z95810 Presence of automatic (implantable) cardiac defibrillator: Secondary | ICD-10-CM | POA: Diagnosis not present

## 2021-09-03 DIAGNOSIS — I5022 Chronic systolic (congestive) heart failure: Secondary | ICD-10-CM | POA: Diagnosis not present

## 2021-09-06 NOTE — Progress Notes (Signed)
EPIC Encounter for ICM Monitoring  Patient Name: Melissa Paul is a 76 y.o. female Date: 09/06/2021 Primary Care Physican: Sharion Balloon, FNP Primary Cardiologist: [place] Electrophysiologist: [facility] Bi-V Pacing: 93%        09/06/2021 Weight: 179-180 lbs    AT/AF Burden: 1.8% (taking Eliquis)         Spoke with patient and heart failure questions reviewed.  Pt has been taking extra Torsemide when she has fluid symptoms but feeling okay at this time.     CorVue Thoracic impedance normal but was suggesting intermittent days with possible fluid accumulation.   Prescribed:  Furosemide 80 mg Take 80 mg by mouth daily. Take and additional 80 mg if needed. Spirolactone 25 mg take 1 tablet at bedtime Farxiga 5 mg take 1 tablet daily   Labs: 03/09/2021 Creatinine 1.50, BUN 22, Potassium 4.0, Sodium 139, GFR 36 10/17/2020 Creatinine 1.36, BUN 31, Potassium 5.1, Sodium 143, GFR 53 09/19/2020 Creatinine 1.19, BUN 27, Potassium 5.1, Sodium 139, GFR 62 08/29/2020 Creatinine 1.31, BUN 15, Potassium 3.7, Sodium 136, GFR 42 07/11/2020 Creatinine 1.13, BUN 12, Potassium 4.0, Sodium 139, GFR 50 06/27/2020 Creatinine 1.57, BUN 22, Potassium 4.8, Sodium 134, GFR 34  A complete set of results can be found in Results Review.   Recommendations:  No changes and encouraged to call if experiencing any fluid symptoms.   Follow-up plan: ICM clinic phone appointment on 10/08/2021.   91 day device clinic remote transmission 10/15/2021.    EP/Cardiology Office Visits: 10/30/2021 with Dr. Domenic Polite.   08/24/2021 with Dr [name].   Copy of ICM check sent to Dr. [name].    3 month ICM trend: 09/03/2021.    12-14 Month ICM trend:     [name], RN 09/06/2021 2:29 PM

## 2021-09-08 ENCOUNTER — Other Ambulatory Visit: Payer: Self-pay | Admitting: Family

## 2021-09-13 ENCOUNTER — Telehealth: Payer: Self-pay | Admitting: Pharmacist

## 2021-09-13 DIAGNOSIS — E1165 Type 2 diabetes mellitus with hyperglycemia: Secondary | ICD-10-CM

## 2021-09-13 MED ORDER — DAPAGLIFLOZIN PROPANEDIOL 10 MG PO TABS: 10.0000 mg | ORAL_TABLET | Freq: Every day | ORAL | 6 refills | Status: DC

## 2021-09-13 NOTE — Telephone Encounter (Signed)
FARXIGA '10MG'$  DAILY refills ESCRIBED/sent to [facility] pharmacy ([place] for [place] patient assistance program) #90 W/ REFILLS  WILL DELIVER TO PATIENT'S HOME ADDRESS PATIENT CAN CALL TO CHECK ON DELIVERY AS NEEDED  [phone]

## 2021-09-19 MED ORDER — DAPAGLIFLOZIN PROPANEDIOL 10 MG PO TABS: 10.0000 mg | ORAL_TABLET | Freq: Every day | ORAL | 6 refills | Status: DC

## 2021-09-19 NOTE — Addendum Note (Signed)
Addended by: Lottie Dawson D on: 09/19/2021 02:57 PM   Modules accepted: Orders

## 2021-09-30 ENCOUNTER — Other Ambulatory Visit: Payer: Self-pay | Admitting: Family

## 2021-10-01 NOTE — Telephone Encounter (Signed)
Pt needs to have labs drawn before we can refill.

## 2021-10-01 NOTE — Telephone Encounter (Signed)
lmtcb

## 2021-10-07 ENCOUNTER — Other Ambulatory Visit: Payer: Self-pay | Admitting: Family

## 2021-10-08 ENCOUNTER — Ambulatory Visit (INDEPENDENT_AMBULATORY_CARE_PROVIDER_SITE_OTHER): Payer: Medicare Other

## 2021-10-08 DIAGNOSIS — Z95810 Presence of automatic (implantable) cardiac defibrillator: Secondary | ICD-10-CM

## 2021-10-08 DIAGNOSIS — I5022 Chronic systolic (congestive) heart failure: Secondary | ICD-10-CM

## 2021-10-08 NOTE — Telephone Encounter (Signed)
[facility]. Needs labwork ONLY. 30 days given 09/09/21

## 2021-10-08 NOTE — Telephone Encounter (Signed)
Appt scheduled for 10/12/2021, pt does not have enough meds needs refill

## 2021-10-08 NOTE — Progress Notes (Signed)
EPIC Encounter for ICM Monitoring  Patient Name: Melissa Paul is a 76 y.o. female Date: 10/08/2021 Primary Care Physican: Sharion Balloon, FNP Primary Cardiologist: [facility] Electrophysiologist: Mealor Bi-V Pacing: 93%        09/06/2021 Weight: 179-180 lbs  8/30//2023 Office Weight: 178 lbs   AT/AF Burden: 1.3% (taking Eliquis)         Spoke with patient and heart failure questions reviewed.  Pt reports she does not have a scale at home but feels like she has put on a lot of weight.  She has a lot of swelling in legs, stomach, and chest areas.  She reports following low salt diet.     CorVue Thoracic impedance suggesting possible fluid accumulation starting 9/13 and also from 9/1-9/10.   Prescribed:  Furosemide 80 mg Take 80 mg by mouth daily. Take and additional 80 mg if needed. Spirolactone 25 mg take 1 tablet at bedtime Farxiga 5 mg take 1 tablet daily   Labs: 03/29/2021 Creatinine 1.32, BUN 15, Potassium 4.2, Sodium 139, GFR 42 03/09/2021 Creatinine 1.50, BUN 22, Potassium 4.0, Sodium 139, GFR 36 A complete set of results can be found in Results Review.   Recommendations:  She took extra 80 mg Furosemide yesterday and will take the extra for another 2 days for total of 3 days.  Copy sent Dr Aundra Dubin for review and any further recommendations.   Follow-up plan: ICM clinic phone appointment on 10/15/2021 to recheck fluid levels.   91 day device clinic remote transmission 10/15/2021.    EP/Cardiology Office Visits: Advised to call Dr Claris Gladden office since last visit was 11/11/2020.  10/30/2021 with Dr. Domenic Polite.      Copy of ICM check sent to Dr. Myles Gip.   3 month ICM trend: 10/08/2021.    12-14 Month ICM trend:     [name], RN 10/08/2021 8:28 AM

## 2021-10-09 MED ORDER — LEVOTHYROXINE SODIUM 88 MCG PO TABS: ORAL_TABLET | ORAL | 0 refills | Status: DC

## 2021-10-09 NOTE — Addendum Note (Signed)
Addended by: Antonietta Barcelona D on: 10/09/2021 03:24 PM   Modules accepted: Orders

## 2021-10-10 ENCOUNTER — Other Ambulatory Visit: Payer: Self-pay | Admitting: Family Medicine

## 2021-10-12 ENCOUNTER — Ambulatory Visit (INDEPENDENT_AMBULATORY_CARE_PROVIDER_SITE_OTHER): Payer: Medicare Other | Admitting: Family

## 2021-10-12 ENCOUNTER — Encounter: Payer: Self-pay | Admitting: Family

## 2021-10-12 VITALS — BP 106/61 | HR 69 | Temp 97.2°F | Ht 62.5 in | Wt 179.6 lb

## 2021-10-12 DIAGNOSIS — I700 Atherosclerosis of aorta: Secondary | ICD-10-CM | POA: Insufficient documentation

## 2021-10-12 DIAGNOSIS — I5022 Chronic systolic (congestive) heart failure: Secondary | ICD-10-CM | POA: Diagnosis not present

## 2021-10-12 DIAGNOSIS — I5023 Acute on chronic systolic (congestive) heart failure: Secondary | ICD-10-CM

## 2021-10-12 DIAGNOSIS — E039 Hypothyroidism, unspecified: Secondary | ICD-10-CM

## 2021-10-12 DIAGNOSIS — I080 Rheumatic disorders of both mitral and aortic valves: Secondary | ICD-10-CM | POA: Diagnosis not present

## 2021-10-12 DIAGNOSIS — F411 Generalized anxiety disorder: Secondary | ICD-10-CM

## 2021-10-12 DIAGNOSIS — N1832 Chronic kidney disease, stage 3b: Secondary | ICD-10-CM

## 2021-10-12 DIAGNOSIS — E1165 Type 2 diabetes mellitus with hyperglycemia: Secondary | ICD-10-CM

## 2021-10-12 DIAGNOSIS — I480 Paroxysmal atrial fibrillation: Secondary | ICD-10-CM

## 2021-10-12 DIAGNOSIS — K5900 Constipation, unspecified: Secondary | ICD-10-CM

## 2021-10-12 DIAGNOSIS — K219 Gastro-esophageal reflux disease without esophagitis: Secondary | ICD-10-CM | POA: Insufficient documentation

## 2021-10-12 MED ORDER — BUPROPION HCL ER (XL) 150 MG PO TB24: 150.0000 mg | ORAL_TABLET | Freq: Every day | ORAL | 1 refills | Status: DC

## 2021-10-12 MED ORDER — ROSUVASTATIN CALCIUM 5 MG PO TABS: 5.0000 mg | ORAL_TABLET | Freq: Every day | ORAL | 3 refills | Status: DC

## 2021-10-12 MED ORDER — GLUCOSE METER TEST VI STRP: ORAL_STRIP | 12 refills | Status: AC

## 2021-10-12 NOTE — Patient Instructions (Signed)

## 2021-10-12 NOTE — Progress Notes (Signed)
Subjective:    Patient ID: Melissa Paul, female    DOB: December 03, 1945, 76 y.o.   MRN: [id number]  Chief Complaint  Patient presents with   Dizziness   Fever    Low grade fever off and on for 2 weeks    Pt presents to the office today for chronic follow up. She is followed by Cardiologists for A Fib, CHF, and cardiomyopathy. And has a pacemaker. She is followed by CHF clinic and weighs daily. She takes Eliquis 5 mg BID for A FIB.   She had a right heart cath on 10/27/20. She reports she is doing well.    Complaining of neuropathy in bilateral legs.    Has CKD and avoids NSAID's.  Congestive Heart Failure Presents for follow-up visit. Associated symptoms include edema and fatigue. Pertinent negatives include Paul near-syncope. The symptoms have been stable.  Diabetes She presents for her follow-up diabetic visit. She has type 2 diabetes mellitus. Hypoglycemia symptoms include nervousness/anxiousness. Associated symptoms include blurred vision, fatigue and foot paresthesias. Symptoms are stable. Diabetic complications include heart disease, nephropathy and peripheral neuropathy. Risk factors for coronary artery disease include dyslipidemia, diabetes mellitus, sedentary lifestyle, hypertension and post-menopausal. She is following a generally healthy diet. Her overall blood glucose range is 110-130 mg/dl. Eye exam is not current.  Thyroid Problem Presents for follow-up visit. Symptoms include anxiety, constipation, depressed mood and fatigue. The symptoms have been stable.  Anxiety Presents for follow-up visit. Symptoms include depressed mood, insomnia, irritability and nervous/anxious behavior. Symptoms occur most days. The severity of symptoms is moderate.    Constipation This is a chronic problem. The current episode started more than 1 year ago. The problem has been waxing and waning since onset. Her stool frequency is 2 to 3 times per week. She has tried laxatives for the symptoms. The  treatment provided moderate relief.  Gastroesophageal Reflux She complains of belching and heartburn. This is a chronic problem. The current episode started more than 1 year ago. The problem occurs occasionally. Associated symptoms include fatigue. Risk factors include obesity. She has tried a PPI for the symptoms. The treatment provided moderate relief.      Review of Systems  Constitutional:  Positive for fatigue and irritability.  Eyes:  Positive for blurred vision.  Cardiovascular:  Negative for near-syncope.  Gastrointestinal:  Positive for constipation and heartburn.  Psychiatric/Behavioral:  The patient is nervous/anxious and has insomnia.   All other systems reviewed and are negative.      Objective:   Physical Exam Vitals reviewed.  Constitutional:      General: She is not in acute distress.    Appearance: She is well-developed. She is obese.  HENT:     Head: Normocephalic and atraumatic.     Right Ear: Tympanic membrane normal.     Left Ear: Tympanic membrane normal.  Eyes:     Pupils: Pupils are equal, round, and reactive to light.  Neck:     Thyroid: Paul thyromegaly.  Cardiovascular:     Rate and Rhythm: Normal rate and regular rhythm.     Heart sounds: Normal heart sounds. Paul murmur heard. Pulmonary:     Effort: Pulmonary effort is normal. Paul respiratory distress.     Breath sounds: Normal breath sounds. Paul wheezing.  Abdominal:     General: Bowel sounds are normal. There is Paul distension.     Palpations: Abdomen is soft.     Tenderness: There is Paul abdominal tenderness.  Musculoskeletal:  General: Paul tenderness. Normal range of motion.     Cervical back: Normal range of motion and neck supple.     Right lower leg: Edema (trace) present.     Left lower leg: Edema (trace) present.  Skin:    General: Skin is warm and dry.  Neurological:     Mental Status: She is alert and oriented to person, place, and time.     Cranial Nerves: Paul cranial nerve  deficit.     Deep Tendon Reflexes: Reflexes are normal and symmetric.  Psychiatric:        Behavior: Behavior normal.        Thought Content: Thought content normal.        Judgment: Judgment normal.       BP 106/61   Pulse 69   Temp (!) 97.2 F (36.2 C) (Temporal)   Ht 5' 2.5" (1.588 m)   Wt 179 lb 9.6 oz (81.5 kg)   BMI 32.33 kg/m      Assessment & Plan:   Melissa Paul comes in today with chief complaint of Dizziness and Fever (Low grade fever off and on for 2 weeks )   Diagnosis and orders addressed:  1. Chronic systolic heart failure (HCC) - CMP14+EGFR - CBC with Differential/Platelet  2. Hypothyroidism, unspecified type - CMP14+EGFR - CBC with Differential/Platelet - TSH  3. MITRAL REGURGITATION - CMP14+EGFR - CBC with Differential/Platelet  4. Paroxysmal atrial fibrillation (HCC) - CMP14+EGFR - CBC with Differential/Platelet  5. Type 2 diabetes mellitus with hyperglycemia, without long-term current use of insulin (HCC) - Bayer DCA Hb A1c Waived - CMP14+EGFR - CBC with Differential/Platelet - Lipid panel - glucose blood (GLUCOSE METER TEST) test strip; Use BID  Dispense: 100 each; Refill: 12 - rosuvastatin (CRESTOR) 5 MG tablet; Take 1 tablet (5 mg total) by mouth daily.  Dispense: 90 tablet; Refill: 3  6. GAD (generalized anxiety disorder) Will add Wellbutrin 150 mg  Stress management  - CMP14+EGFR - CBC with Differential/Platelet - buPROPion (WELLBUTRIN XL) 150 MG 24 hr tablet; Take 1 tablet (150 mg total) by mouth daily.  Dispense: 90 tablet; Refill: 1  7. Acute on chronic systolic ACC/AHA stage C congestive heart failure (HCC) - CMP14+EGFR - CBC with Differential/Platelet  8. Gastroesophageal reflux disease, unspecified whether esophagitis present - CMP14+EGFR - CBC with Differential/Platelet  9. Stage 3b chronic kidney disease (HCC) - CMP14+EGFR - CBC with Differential/Platelet  10. Constipation, unspecified constipation type -  CMP14+EGFR - CBC with Differential/Platelet  11. Atherosclerosis of aorta (HCC) Start Crestor 5 mg daily  - CMP14+EGFR - CBC with Differential/Platelet - Lipid panel - rosuvastatin (CRESTOR) 5 MG tablet; Take 1 tablet (5 mg total) by mouth daily.  Dispense: 90 tablet; Refill: 3   Labs pending Health Maintenance reviewed Diet and exercise encouraged  Follow up plan: 1 month recheck GAD and starting crestor   Evelina Dun, FNP

## 2021-10-13 LAB — CMP14+EGFR
ALT: 6 IU/L (ref 0–32)
AST: 10 IU/L (ref 0–40)
Albumin/Globulin Ratio: 1.9 (ref 1.2–2.2)
Albumin: 4.5 g/dL (ref 3.8–4.8)
Alkaline Phosphatase: 91 IU/L (ref 44–121)
BUN/Creatinine Ratio: 14 (ref 12–28)
BUN: 16 mg/dL (ref 8–27)
Bilirubin Total: 0.2 mg/dL (ref 0.0–1.2)
CO2: 23 mmol/L (ref 20–29)
Calcium: 9.1 mg/dL (ref 8.7–10.3)
Chloride: 100 mmol/L (ref 96–106)
Creatinine, Ser: 1.17 mg/dL — ABNORMAL HIGH (ref 0.57–1.00)
Globulin, Total: 2.4 g/dL (ref 1.5–4.5)
Glucose: 130 mg/dL — ABNORMAL HIGH (ref 70–99)
Potassium: 4.8 mmol/L (ref 3.5–5.2)
Sodium: 139 mmol/L (ref 134–144)
Total Protein: 6.9 g/dL (ref 6.0–8.5)
eGFR: 48 mL/min/{1.73_m2} — ABNORMAL LOW (ref >59)

## 2021-10-13 LAB — CBC WITH DIFFERENTIAL/PLATELET
Basophils Absolute: 0 10*3/uL (ref 0.0–0.2)
Basos: 1 %
EOS (ABSOLUTE): 0.1 10*3/uL (ref 0.0–0.4)
Eos: 1 %
Hematocrit: 28.6 % — ABNORMAL LOW (ref 34.0–46.6)
Hemoglobin: 9.2 g/dL — ABNORMAL LOW (ref 11.1–15.9)
Immature Grans (Abs): 0 10*3/uL (ref 0.0–0.1)
Immature Granulocytes: 1 %
Lymphocytes Absolute: 2.4 10*3/uL (ref 0.7–3.1)
Lymphs: 37 %
MCH: 23.8 pg — ABNORMAL LOW (ref 26.6–33.0)
MCHC: 32.2 g/dL (ref 31.5–35.7)
MCV: 74 fL — ABNORMAL LOW (ref 79–97)
Monocytes Absolute: 0.5 10*3/uL (ref 0.1–0.9)
Monocytes: 8 %
Neutrophils Absolute: 3.3 10*3/uL (ref 1.4–7.0)
Neutrophils: 52 %
Platelets: 253 10*3/uL (ref 150–450)
RBC: 3.86 x10E6/uL (ref 3.77–5.28)
RDW: 16.2 % — ABNORMAL HIGH (ref 11.7–15.4)
WBC: 6.4 10*3/uL (ref 3.4–10.8)

## 2021-10-13 LAB — LIPID PANEL
Chol/HDL Ratio: 4.3 ratio (ref 0.0–4.4)
Cholesterol, Total: 160 mg/dL (ref 100–199)
HDL: 37 mg/dL — ABNORMAL LOW (ref >39)
LDL Chol Calc (NIH): 97 mg/dL (ref 0–99)
Triglycerides: 148 mg/dL (ref 0–149)
VLDL Cholesterol Cal: 26 mg/dL (ref 5–40)

## 2021-10-13 LAB — TSH: TSH: 2.6 u[IU]/mL (ref 0.450–4.500)

## 2021-10-15 ENCOUNTER — Ambulatory Visit (INDEPENDENT_AMBULATORY_CARE_PROVIDER_SITE_OTHER): Payer: Medicare Other

## 2021-10-15 DIAGNOSIS — I5022 Chronic systolic (congestive) heart failure: Secondary | ICD-10-CM

## 2021-10-15 DIAGNOSIS — Z95810 Presence of automatic (implantable) cardiac defibrillator: Secondary | ICD-10-CM

## 2021-10-15 LAB — BAYER DCA HB A1C WAIVED: HB A1C (BAYER DCA - WAIVED): 6.6 % — ABNORMAL HIGH (ref 4.8–5.6)

## 2021-10-15 NOTE — Progress Notes (Unsigned)
EPIC Encounter for ICM Monitoring  Patient Name: Melissa Paul is a 76 y.o. female Date: 10/15/2021 Primary Care Physican: Sharion Balloon, FNP Primary Cardiologist: [facility] Electrophysiologist: [facility] Bi-V Pacing: 93%        09/06/2021 Weight: 179-180 lbs  8/30//2023 Office Weight: 178 lbs   AT/AF Burden: 1.3% (taking Eliquis)         Spoke with patient and heart failure questions reviewed.  Transmission results reviewed.  Pt reports ankles always stay swollen but has resolved in chest/stomach area.  She has a headache and may go to ER to get evaluated.      CorVue Thoracic impedance suggesting fluid levels returned to normal after self adjusting Furosemide x 3 days.   Prescribed:  Furosemide 80 mg Take 80 mg by mouth daily. Take and additional 80 mg if needed. Spirolactone 25 mg take 1 tablet at bedtime Farxiga 5 mg take 1 tablet daily   Labs: 03/29/2021 Creatinine 1.32, BUN 15, Potassium 4.2, Sodium 139, GFR 42 03/09/2021 Creatinine 1.50, BUN 22, Potassium 4.0, Sodium 139, GFR 36 A complete set of results can be found in Results Review.   Recommendations:  No changes and encouraged to call if experiencing any fluid symptoms.   Follow-up plan: ICM clinic phone appointment on 11/12/2021.   91 day device clinic remote transmission 04/15/2022.    EP/Cardiology Office Visits:   Is aware to call for over due appt with Dr Aundra Dubin (last visit was 11/11/2020).  10/30/2021 with Dr. Domenic Polite.   Recall 08/24/2022 with Dr Myles Gip.   Copy of ICM check sent to Dr. Myles Gip.   3 month ICM trend: 10/15/2021.    12-14 Month ICM trend:     [name], RN 10/15/2021 9:15 AM

## 2021-10-17 LAB — CUP PACEART REMOTE DEVICE CHECK
Battery Remaining Longevity: 20 mo
Battery Remaining Percentage: 28 %
Battery Voltage: 2.86 V
Brady Statistic AP VP Percent: 86 %
Brady Statistic AP VS Percent: 2 %
Brady Statistic AS VP Percent: 7 %
Brady Statistic AS VS Percent: 1 %
Brady Statistic RA Percent Paced: 83 %
Date Time Interrogation Session: 20230925020017
HighPow Impedance: 66 Ohm
HighPow Impedance: 66 Ohm
Implantable Lead Implant Date: 20110912
Implantable Lead Implant Date: 20110912
Implantable Lead Implant Date: 20120710
Implantable Lead Location: 753858
Implantable Lead Location: 753859
Implantable Lead Location: 753860
Implantable Lead Model: 7122
Implantable Pulse Generator Implant Date: 20180621
Lead Channel Impedance Value: 310 Ohm
Lead Channel Impedance Value: 440 Ohm
Lead Channel Impedance Value: 460 Ohm
Lead Channel Pacing Threshold Amplitude: 0.75 V
Lead Channel Pacing Threshold Amplitude: 0.75 V
Lead Channel Pacing Threshold Amplitude: 0.75 V
Lead Channel Pacing Threshold Pulse Width: 0.5 ms
Lead Channel Pacing Threshold Pulse Width: 0.5 ms
Lead Channel Pacing Threshold Pulse Width: 0.5 ms
Lead Channel Sensing Intrinsic Amplitude: 12 mV
Lead Channel Sensing Intrinsic Amplitude: 2.2 mV
Lead Channel Setting Pacing Amplitude: 2 V
Lead Channel Setting Pacing Amplitude: 2 V
Lead Channel Setting Pacing Amplitude: 2 V
Lead Channel Setting Pacing Pulse Width: 0.5 ms
Lead Channel Setting Pacing Pulse Width: 0.5 ms
Lead Channel Setting Sensing Sensitivity: 0.5 mV
Pulse Gen Serial Number: 9761374

## 2021-10-18 ENCOUNTER — Other Ambulatory Visit: Payer: Self-pay | Admitting: Family

## 2021-10-18 ENCOUNTER — Other Ambulatory Visit: Payer: Medicare Other

## 2021-10-18 ENCOUNTER — Other Ambulatory Visit: Payer: Self-pay | Admitting: Family Medicine

## 2021-10-18 DIAGNOSIS — D649 Anemia, unspecified: Secondary | ICD-10-CM

## 2021-10-19 LAB — FECAL OCCULT BLOOD, IMMUNOCHEMICAL: Fecal Occult Bld: POSITIVE — AB

## 2021-10-22 ENCOUNTER — Other Ambulatory Visit: Payer: Self-pay | Admitting: Family

## 2021-10-22 DIAGNOSIS — R195 Other fecal abnormalities: Secondary | ICD-10-CM

## 2021-10-24 ENCOUNTER — Other Ambulatory Visit: Payer: Self-pay | Admitting: Family

## 2021-10-25 ENCOUNTER — Encounter: Payer: Self-pay | Admitting: *Deleted

## 2021-10-29 NOTE — Progress Notes (Unsigned)
Cardiology Office Note  Date: 10/29/2021   ID: Melissa Paul, DOB 1946-01-14, MRN [id number]  PCP:  Sharion Balloon, FNP  Cardiologist:  Rozann Lesches, MD Electrophysiologist:  Thompson Grayer, MD   No chief complaint on file.   History of Present Illness: Melissa Paul is a 76 y.o. female last seen in February.  St. Jude biventricular ICD in place.  Device interrogation recently showed normal function with known atrial lead noise.  Past Medical History:  Diagnosis Date   Anxiety    Chronic systolic heart failure (HCC)    Constipation    Cyst of right kidney 11/12/2017   Glucose intolerance (pre-diabetes)    Hyperlipemia    Hypothyroidism    LBBB (left bundle branch block)    Mitral regurgitation    Mild to Moderate   Nonischemic cardiomyopathy (HCC)    LVEF 35-40% by echo 8/13   Paroxysmal atrial fibrillation (HCC)    Brief episodes by device interrogation   UTI (urinary tract infection)    Recurrent   Ventricular tachycardia ([place])    ICD therapy for VT    Past Surgical History:  Procedure Laterality Date   APPENDECTOMY     BIV ICD GENERATOR CHANGEOUT N/A 07/11/2016   Procedure: BiV ICD Generator Changeout;  Surgeon: Thompson Grayer, MD;  Location: [facility];  Service: Cardiovascular;  Laterality: N/A;   CARDIAC DEFIBRILLATOR PLACEMENT  09/2009   St. Jude BiV ICD by Dr. Sandria Manly class III   CHOLECYSTECTOMY     has one ovary left     KNEE ARTHROSCOPY     Left   LEFT AND RIGHT HEART CATHETERIZATION WITH CORONARY ANGIOGRAM N/A 04/12/2014   Procedure: LEFT AND RIGHT HEART CATHETERIZATION WITH CORONARY ANGIOGRAM;  Surgeon: Leonie Man, MD;  Location: [facility];  Service: Cardiovascular;  Laterality: N/A;   Left breast biopsy x 2     no maliganancy   PACEMAKER IMPLANT     RIGHT HEART CATH N/A 10/27/2020   Procedure: RIGHT HEART CATH;  Surgeon: [name], MD;  Location: [facility];  Service: Cardiovascular;  Laterality: N/A;    RIGHT/LEFT HEART CATH AND CORONARY ANGIOGRAPHY N/A 03/26/2019   Procedure: RIGHT/LEFT HEART CATH AND CORONARY ANGIOGRAPHY;  Surgeon: [name], MD;  Location: [facility];  Service: Cardiovascular;  Laterality: N/A;   TOTAL ABDOMINAL HYSTERECTOMY     (R) ovary removed    Current Outpatient Medications  Medication Sig Dispense Refill   acetaminophen (TYLENOL) 650 MG CR tablet Take 650 mg by mouth every 8 (eight) hours as needed for pain.     apixaban (ELIQUIS) 5 MG TABS tablet Take 1 tablet (5 mg total) by mouth 2 (two) times daily. Need to schedule an appointment for further refills 180 tablet 0   blood glucose meter kit and supplies 1 each by Other route 2 (two) times daily as needed for other. Dispense based on patient and insurance preference. . (FOR ICD-10 E10.9, E11.9).Dispense based on patient and insurance preference. 1 each 0   buPROPion (WELLBUTRIN XL) 150 MG 24 hr tablet Take 1 tablet (150 mg total) by mouth daily. 90 tablet 1   carvedilol (COREG) 12.5 MG tablet Take 1 tablet by mouth twice daily 180 tablet 3   clobetasol ointment (TEMOVATE) 8.33 % Apply 1 application topically 2 (two) times daily. Apply 2-3 x weekly 30 g 3   dapagliflozin propanediol (FARXIGA) 10 MG TABS tablet Take 1 tablet (10 mg  total) by mouth daily. 90 tablet 6   estradiol (ESTRACE) 0.1 MG/GM vaginal cream Place 1 g vaginally 2 (two) times a week.     FLAREX 0.1 % ophthalmic suspension Apply to eye.     furosemide (LASIX) 80 MG tablet TAKE 1 TABLET BY MOUTH ONCE DAILY AS NEEDED FOR  EXTRA  FLUID 90 tablet 0   glucose blood (GLUCOSE METER TEST) test strip Use BID 100 each 12   hydrOXYzine (VISTARIL) 25 MG capsule TAKE 1 CAPSULE BY MOUTH THREE TIMES DAILY 90 capsule 1   levothyroxine (SYNTHROID) 88 MCG tablet TAKE 1 TABLET BY MOUTH ONCE DAILY BEFORE BREAKFAST 90 tablet 3   metFORMIN (GLUCOPHAGE-XR) 750 MG 24 hr tablet Take 1 tablet (750 mg total) by mouth 2 (two) times daily. 180 tablet 3    pantoprazole (PROTONIX) 40 MG tablet TAKE 1 TABLET BY MOUTH ONCE DAILY BEFORE BREAKFAST 90 tablet 0   rosuvastatin (CRESTOR) 5 MG tablet Take 1 tablet (5 mg total) by mouth daily. 90 tablet 3   spironolactone (ALDACTONE) 25 MG tablet Take 1 tablet by mouth once daily 90 tablet 11   Vericiguat (VERQUVO) 5 MG TABS Take 1.5 tablets (7.5 mg total) by mouth daily at 12 noon. 45 tablet 6   No current facility-administered medications for this visit.   Allergies:  Buspar [buspirone], Lexapro [escitalopram oxalate], Trulicity [dulaglutide], Bactrim, Cephalexin, Chlorhexidine gluconate, Clindamycin, Contrast media [iodinated contrast media], Doxycycline, Latex, Levofloxacin, Nitrofurantoin monohyd macro, and Penicillins   Social History: The patient  reports that she quit smoking about 25 years ago. Her smoking use included cigarettes. She started smoking about 72 years ago. She has a 6.00 pack-year smoking history. She has never used smokeless tobacco. She reports that she does not drink alcohol and does not use drugs.   Family History: The patient's family history includes CVA in her mother; Cancer in her father; Heart Problems in her mother; Heart disease in her maternal grandfather and maternal grandmother.   ROS:  Please see the history of present illness. Otherwise, complete review of systems is positive for {NONE DEFAULTED:18576}.  All other systems are reviewed and negative.   Physical Exam: VS:  There were no vitals taken for this visit., BMI There is no height or weight on file to calculate BMI.  Wt Readings from Last 3 Encounters:  10/12/21 179 lb 9.6 oz (81.5 kg)  08/27/21 183 lb 12.8 oz (83.4 kg)  08/24/21 180 lb 9.6 oz (81.9 kg)    General: Patient appears comfortable at rest. HEENT: Conjunctiva and lids normal, oropharynx clear with moist mucosa. Neck: Supple, no elevated JVP or carotid bruits, no thyromegaly. Lungs: Clear to auscultation, nonlabored breathing at rest. Cardiac:  Regular rate and rhythm, no S3 or significant systolic murmur, no pericardial rub. Abdomen: Soft, nontender, no hepatomegaly, bowel sounds present, no guarding or rebound. Extremities: No pitting edema, distal pulses 2+. Skin: Warm and dry. Musculoskeletal: No kyphosis. Neuropsychiatric: Alert and oriented x3, affect grossly appropriate.  ECG:  An ECG dated 11/17/2020 was personally reviewed today and demonstrated:  Dual chamber pacing with PVC.  Recent Labwork: 10/12/2021: ALT 6; AST 10; BUN 16; Creatinine, Ser 1.17; Hemoglobin 9.2; Platelets 253; Potassium 4.8; Sodium 139; TSH 2.600     Component Value Date/Time   CHOL 160 10/12/2021 1613   TRIG 148 10/12/2021 1613   HDL 37 (L) 10/12/2021 1613   CHOLHDL 4.3 10/12/2021 1613   CHOLHDL 3.3 04/11/2007 0600   VLDL 10 04/11/2007 0600   LDLCALC 97  10/12/2021 1613    Other Studies Reviewed Today:  Echocardiogram 11/17/2020:  1. Left ventricular ejection fraction, by estimation, is 25 to 30%. The  left ventricle has severely decreased function. The left ventricle  demonstrates global hypokinesis. The left ventricular internal cavity size  was moderately dilated. Left  ventricular diastolic parameters are consistent with Grade II diastolic  dysfunction (pseudonormalization).   2. Right ventricular systolic function is mildly reduced. The right  ventricular size is mildly enlarged. There is normal pulmonary artery  systolic pressure. The estimated right ventricular systolic pressure is  24.1 mmHg.   3. Left atrial size was mildly dilated.   4. Right atrial size was mildly dilated.   5. The mitral valve is normal in structure. Mild mitral valve  regurgitation. No evidence of mitral stenosis.   6. The aortic valve is tricuspid. Aortic valve regurgitation is trivial.  No aortic stenosis is present.   7. The inferior vena cava is normal in size with greater than 50%  respiratory variability, suggesting right atrial pressure of 3 mmHg.    Assessment and Plan:    Medication Adjustments/Labs and Tests Ordered: Current medicines are reviewed at length with the patient today.  Concerns regarding medicines are outlined above.   Tests Ordered: No orders of the defined types were placed in this encounter.   Medication Changes: No orders of the defined types were placed in this encounter.   Disposition:  Follow up {follow up:15908}  Signed, Satira Sark, MD, [facility] 10/29/2021 10:35 AM    [facility] [place], [place], [place] [address] Phone: [phone]; Fax: [phone]

## 2021-10-30 ENCOUNTER — Encounter: Payer: Self-pay | Admitting: Cardiology

## 2021-10-30 ENCOUNTER — Ambulatory Visit: Payer: Medicare Other | Attending: Cardiology | Admitting: Cardiology

## 2021-10-30 VITALS — BP 90/60 | HR 70 | Ht 62.5 in | Wt 175.8 lb

## 2021-10-30 DIAGNOSIS — I480 Paroxysmal atrial fibrillation: Secondary | ICD-10-CM | POA: Insufficient documentation

## 2021-10-30 DIAGNOSIS — I428 Other cardiomyopathies: Secondary | ICD-10-CM | POA: Diagnosis present

## 2021-10-30 DIAGNOSIS — I5020 Unspecified systolic (congestive) heart failure: Secondary | ICD-10-CM | POA: Insufficient documentation

## 2021-10-30 NOTE — Patient Instructions (Addendum)

## 2021-11-02 NOTE — Progress Notes (Signed)
Remote ICD transmission.   

## 2021-11-06 ENCOUNTER — Ambulatory Visit: Payer: Medicare Other | Attending: Cardiology

## 2021-11-06 DIAGNOSIS — I428 Other cardiomyopathies: Secondary | ICD-10-CM | POA: Diagnosis present

## 2021-11-07 LAB — ECHOCARDIOGRAM COMPLETE
AR max vel: 1.85 cm2
AV Area VTI: 1.88 cm2
AV Area mean vel: 1.94 cm2
AV Mean grad: 3.4 mmHg
AV Peak grad: 7.1 mmHg
Ao pk vel: 1.33 m/s
Area-P 1/2: 3.63 cm2
Calc EF: 31.3 %
MV M vel: 4.67 m/s
MV Peak grad: 87.4 mmHg
MV Vena cont: 0.16 cm
P 1/2 time: 1490 msec
Radius: 0.3 cm
S' Lateral: 5.62 cm
Single Plane A2C EF: 31.7 %
Single Plane A4C EF: 30.5 %

## 2021-11-09 ENCOUNTER — Telehealth: Payer: Self-pay

## 2021-11-09 NOTE — Telephone Encounter (Signed)
Received notification from [employer] regarding RE-ENROLLMENT approval for FARXIGA. Patient assistance approved from 01/21/22 to 01/21/23.  Phone: [phone]  

## 2021-11-12 ENCOUNTER — Ambulatory Visit (INDEPENDENT_AMBULATORY_CARE_PROVIDER_SITE_OTHER): Payer: Medicare Other

## 2021-11-12 DIAGNOSIS — I5022 Chronic systolic (congestive) heart failure: Secondary | ICD-10-CM | POA: Diagnosis not present

## 2021-11-12 DIAGNOSIS — Z95810 Presence of automatic (implantable) cardiac defibrillator: Secondary | ICD-10-CM | POA: Diagnosis not present

## 2021-11-12 NOTE — Progress Notes (Deleted)
Referring Provider: Sharion Balloon, FNP Primary Care Physician:  Sharion Balloon, FNP Primary Gastroenterologist:  Dr. Rayne Du chief complaint on file.   HPI:   Melissa Paul is a 76 y.o. female with history of CHF, ventricular tachycardia, atrial fibrillation with [employer] Jude biventricular ICD in place, hyperlipidemia, hypothyroidism, type 2 diabetes, CKD, GERD, constipation, presenting today at the request of Sharion Balloon, FNP for positive fecal occult blood test.  Reviewed labs available in epic.  Fecal occult blood test was + 10/18/2021. 10/12/2021, hemoglobin low at 9.2 with microcytic indices.  Prior baseline hemoglobin in the 12-13 range in 2021, 11 range in 2022, down to 10.2 in March 2023.  Today:     EF 25-30% in October 2023.  Per review of OV with cardiology 10/30/21, patient is ok to hold Eliquis for colonoscopy.   Past Medical History:  Diagnosis Date   Anxiety    Chronic systolic heart failure (HCC)    Constipation    Cyst of right kidney 11/12/2017   Glucose intolerance (pre-diabetes)    Hyperlipemia    Hypothyroidism    LBBB (left bundle branch block)    Mitral regurgitation    Mild to Moderate   Nonischemic cardiomyopathy (HCC)    LVEF 35-40% by echo 8/13   Paroxysmal atrial fibrillation (HCC)    Brief episodes by device interrogation   UTI (urinary tract infection)    Recurrent   Ventricular tachycardia ([place])    ICD therapy for VT    Past Surgical History:  Procedure Laterality Date   APPENDECTOMY     BIV ICD GENERATOR CHANGEOUT N/A 07/11/2016   Procedure: BiV ICD Generator Changeout;  Surgeon: Thompson Grayer, MD;  Location: [facility];  Service: Cardiovascular;  Laterality: N/A;   CARDIAC DEFIBRILLATOR PLACEMENT  09/2009   St. Jude BiV ICD by Dr. Sandria Manly class III   CHOLECYSTECTOMY     has one ovary left     KNEE ARTHROSCOPY     Left   LEFT AND RIGHT HEART CATHETERIZATION WITH CORONARY ANGIOGRAM N/A 04/12/2014   Procedure:  LEFT AND RIGHT HEART CATHETERIZATION WITH CORONARY ANGIOGRAM;  Surgeon: Leonie Man, MD;  Location[facility];  Service: Cardiovascular;  Laterality: N/A;   Left breast biopsy x 2     no maliganancy   PACEMAKER IMPLANT     RIGHT HEART CATH N/A 10/27/2020   Procedure: RIGHT HEART CATH;  Surgeon: [name], MD;  Location: [facility];  Service: Cardiovascular;  Laterality: N/A;   RIGHT/LEFT HEART CATH AND CORONARY ANGIOGRAPHY N/A 03/26/2019   Procedure: RIGHT/LEFT HEART CATH AND CORONARY ANGIOGRAPHY;  Surgeon: [name], MD;  Location: [facility];  Service: Cardiovascular;  Laterality: N/A;   TOTAL ABDOMINAL HYSTERECTOMY     (R) ovary removed    Current Outpatient Medications  Medication Sig Dispense Refill   acetaminophen (TYLENOL) 650 MG CR tablet Take 650 mg by mouth every 8 (eight) hours as needed for pain.     apixaban (ELIQUIS) 5 MG TABS tablet Take 1 tablet (5 mg total) by mouth 2 (two) times daily. Need to schedule an appointment for further refills 180 tablet 0   blood glucose meter kit and supplies 1 each by Other route 2 (two) times daily as needed for other. Dispense based on patient and insurance preference. . (FOR ICD-10 E10.9, E11.9).Dispense based on patient and insurance preference. 1 each 0   buPROPion (WELLBUTRIN XL) 150 MG  24 hr tablet Take 1 tablet (150 mg total) by mouth daily. 90 tablet 1   carvedilol (COREG) 12.5 MG tablet Take 1 tablet by mouth twice daily 180 tablet 3   clobetasol ointment (TEMOVATE) 0.62 % Apply 1 application topically 2 (two) times daily. Apply 2-3 x weekly 30 g 3   dapagliflozin propanediol (FARXIGA) 10 MG TABS tablet Take 1 tablet (10 mg total) by mouth daily. 90 tablet 6   estradiol (ESTRACE) 0.1 MG/GM vaginal cream Place 1 g vaginally 2 (two) times a week.     FLAREX 0.1 % ophthalmic suspension Apply to eye.     furosemide (LASIX) 80 MG tablet TAKE 1 TABLET BY MOUTH ONCE DAILY AS NEEDED FOR  EXTRA  FLUID 90 tablet 0    glucose blood (GLUCOSE METER TEST) test strip Use BID 100 each 12   hydrOXYzine (VISTARIL) 25 MG capsule TAKE 1 CAPSULE BY MOUTH THREE TIMES DAILY (Patient taking differently: Take 25 mg by mouth daily.) 90 capsule 1   levothyroxine (SYNTHROID) 88 MCG tablet TAKE 1 TABLET BY MOUTH ONCE DAILY BEFORE BREAKFAST 90 tablet 3   metFORMIN (GLUCOPHAGE-XR) 750 MG 24 hr tablet Take 1 tablet (750 mg total) by mouth 2 (two) times daily. 180 tablet 3   pantoprazole (PROTONIX) 40 MG tablet TAKE 1 TABLET BY MOUTH ONCE DAILY BEFORE BREAKFAST 90 tablet 0   spironolactone (ALDACTONE) 25 MG tablet Take 1 tablet by mouth once daily 90 tablet 11   Vericiguat (VERQUVO) 5 MG TABS Take 1.5 tablets (7.5 mg total) by mouth daily at 12 noon. 45 tablet 6   No current facility-administered medications for this visit.    Allergies as of 11/14/2021 - Review Complete 10/30/2021  Allergen Reaction Noted   Buspar [buspirone] Other (See Comments) 10/08/2018   Lexapro [escitalopram oxalate] Other (See Comments) 37/62/8315   Trulicity [dulaglutide] Hives and Itching 02/01/2021   Bactrim Rash 09/13/2010   Cephalexin Swelling and Rash    Chlorhexidine gluconate Hives and Itching 07/11/2016   Clindamycin Rash    Contrast media [iodinated contrast media] Rash 09/13/2010   Crestor [rosuvastatin] Hives and Rash 10/30/2021   Doxycycline Rash 02/27/2017   Latex Rash    Levofloxacin Rash    Nitrofurantoin monohyd macro Rash 09/13/2010   Penicillins Swelling and Rash     Family History  Problem Relation Age of Onset   CVA Mother        multiple   Heart Problems Mother        "weak heart"   Heart disease Maternal Grandmother    Heart disease Maternal Grandfather    Cancer Father        Stomach and eshpogus   Diabetes Neg Hx    Hypertension Neg Hx    Coronary artery disease Neg Hx     Social History   Socioeconomic History   Marital status: Widowed    Spouse name: Not on file   Number of children: 2   Years of  education: Not on file   Highest education level: Not on file  Occupational History   Not on file  Tobacco Use   Smoking status: Former    Packs/day: 0.30    Years: 20.00    Total pack years: 6.00    Types: Cigarettes    Start date: 08/03/1949    Quit date: 01/22/1996    Years since quitting: 25.8    Passive exposure: Never   Smokeless tobacco: Never  Vaping Use   Vaping Use: Never used  Substance and Sexual Activity   Alcohol use: No    Alcohol/week: 0.0 standard drinks of alcohol   Drug use: No   Sexual activity: Not Currently    Birth control/protection: Surgical    Comment: hyst  Other Topics Concern   Not on file  Social History Narrative   Her daughter, Terrence Dupont lives with her - takes her to all visits and cooks for her.   Social Determinants of Health   Financial Resource Strain: Low Risk  (07/18/2021)   Overall Financial Resource Strain (CARDIA)    Difficulty of Paying Living Expenses: Not hard at all  Food Insecurity: No Food Insecurity (07/18/2021)   Hunger Vital Sign    Worried About Running Out of Food in the Last Year: Never true    Ran Out of Food in the Last Year: Never true  Transportation Needs: No Transportation Needs (07/18/2021)   PRAPARE - Hydrologist (Medical): No    Lack of Transportation (Non-Medical): No  Physical Activity: Sufficiently Active (07/18/2021)   Exercise Vital Sign    Days of Exercise per Week: 5 days    Minutes of Exercise per Session: 30 min  Stress: No Stress Concern Present (07/18/2021)   [place]    Feeling of Stress : Not at all  Social Connections: [place] (07/18/2021)   Social Connection and Isolation Panel [NHANES]    Frequency of Communication with Friends and Family: More than three times a week    Frequency of Social Gatherings with Friends and Family: More than three times a week    Attends Religious Services: More than  4 times per year    Active Member of [employer] or Organizations: Yes    Attends Music therapist: More than 4 times per year    Marital Status: Married  Human resources officer Violence: Not At Risk (07/18/2021)   Humiliation, Afraid, Rape, and Kick questionnaire    Fear of Current or Ex-Partner: No    Emotionally Abused: No    Physically Abused: No    Sexually Abused: No    Review of Systems: Gen: Denies any fever, chills, cold or flulike symptoms, presyncope, syncope. CV: Denies chest pain, heart palpitations.  Resp: Denies shortness of breath, cough. GI: See HPI GU : Denies urinary burning, urinary frequency, urinary hesitancy MS: Denies joint pain. Derm: Denies rash. Psych: Denies depression, anxiety. Heme: See HPI  Physical Exam: There were no vitals taken for this visit. General:   Alert and oriented. Pleasant and cooperative. Well-nourished and well-developed.  Head:  Normocephalic and atraumatic. Eyes:  Without icterus, sclera clear and conjunctiva pink.  Ears:  Normal auditory acuity. Lungs:  Clear to auscultation bilaterally. No wheezes, rales, or rhonchi. No distress.  Heart:  S1, S2 present without murmurs appreciated.  Abdomen:  +BS, soft, non-tender and non-distended. No HSM noted. No guarding or rebound. No masses appreciated.  Rectal:  Deferred  Msk:  Symmetrical without gross deformities. Normal posture. Extremities:  Without edema. Neurologic:  Alert and  oriented x4;  grossly normal neurologically. Skin:  Intact without significant lesions or rashes. Psych:  Normal mood and affect.    Assessment:     Plan:  ***   Aliene Altes, PA-C [facility] 11/14/2021

## 2021-11-12 NOTE — Progress Notes (Signed)
EPIC Encounter for ICM Monitoring  Patient Name: Melissa Paul is a 76 y.o. female Date: 11/12/2021 Primary Care Physican: Sharion Balloon, FNP Primary Cardiologist: [place] Electrophysiologist: [facility] Bi-V Pacing: 93%        09/06/2021 Weight: 179-180 lbs  8/30//2023 Office Weight: 178 lbs 11/12/2021 Weight: 178 lbs   AT/AF Burden: 1.2% (taking Eliquis)         Spoke with patient and heart failure questions reviewed.  Transmission results reviewed.  Pt reports feet and ankles are swollen and weight up 5 to 6 lbs.  She tries to limit fluid intake to 64 oz daily but also needs to get enough fluids to help prevent UTI.     CorVue Thoracic impedance suggesting possible fluid accumulation starting 10/12.    Prescribed:  Furosemide 80 mg Take 80 mg by mouth daily. Take and additional 80 mg if needed. Spirolactone 25 mg take 1 tablet at bedtime Farxiga 5 mg take 1 tablet daily   Labs: 10/12/2021 Creatinine 1.17, BUN 16, Potassium 4.8, Sodium 139, GFR 48 03/29/2021 Creatinine 1.32, BUN 15, Potassium 4.2, Sodium 139, GFR 42 03/09/2021 Creatinine 1.50, BUN 22, Potassium 4.0, Sodium 139, GFR 36 A complete set of results can be found in Results Review.   Recommendations:  She is taking extra Furosemide for few days to help resolve fluid levels.  She will call Dr Claris Gladden office to schedule an appointment.    Appt last week and fluid symptoms were evaluated by Dr Domenic Polite.   She has been elevating her legs to help with swelling.     Follow-up plan: ICM clinic phone appointment on 11/19/2021 to recheck fluid levels.   91 day device clinic remote transmission 04/15/2022.    EP/Cardiology Office Visits:   She will call for appt with Dr Aundra Dubin (last visit was 11/11/2020).  05/03/2022 with Dr. Domenic Polite.   Recall 08/24/2022 with Dr Myles Gip.   Copy of ICM check sent to Dr. Myles Gip.    3 month ICM trend: 11/12/2021.    12-14 Month ICM trend:     [name], RN 11/12/2021 8:10  AM

## 2021-11-14 ENCOUNTER — Ambulatory Visit: Payer: Medicare Other | Admitting: Gastroenterology

## 2021-11-16 ENCOUNTER — Other Ambulatory Visit: Payer: Self-pay | Admitting: *Deleted

## 2021-11-16 MED ORDER — VERQUVO 5 MG PO TABS: 7.5000 mg | ORAL_TABLET | Freq: Every day | ORAL | 0 refills | Status: DC

## 2021-11-19 ENCOUNTER — Ambulatory Visit (INDEPENDENT_AMBULATORY_CARE_PROVIDER_SITE_OTHER): Payer: Medicare Other

## 2021-11-19 DIAGNOSIS — Z95810 Presence of automatic (implantable) cardiac defibrillator: Secondary | ICD-10-CM

## 2021-11-19 DIAGNOSIS — I5022 Chronic systolic (congestive) heart failure: Secondary | ICD-10-CM

## 2021-11-20 NOTE — Progress Notes (Signed)
EPIC Encounter for ICM Monitoring  Patient Name: Melissa Paul is a 76 y.o. female Date: 11/20/2021 Primary Care Physican: Sharion Balloon, FNP Primary Cardiologist: [place] Electrophysiologist: Mealor Bi-V Pacing: 92%        09/06/2021 Weight: 179-180 lbs  8/30//2023 Office Weight: 178 lbs 11/12/2021 Weight: 178 lbs 11/20/2021 Weight: 173 lbs   AT/AF Burden: 1.2% (taking Eliquis)         Spoke with patient and heart failure questions reviewed.  Transmission results reviewed.  Pt reports she took extra Furosemide and weight dropped 5 pounds and lower extremity swelling has almost resolved.     CorVue Thoracic impedance suggesting fluid levels returned to normal.    Prescribed:  Furosemide 80 mg Take 80 mg by mouth daily. Take and additional 80 mg if needed. Spirolactone 25 mg take 1 tablet at bedtime Farxiga 5 mg take 1 tablet daily   Labs: 10/12/2021 Creatinine 1.17, BUN 16, Potassium 4.8, Sodium 139, GFR 48 03/29/2021 Creatinine 1.32, BUN 15, Potassium 4.2, Sodium 139, GFR 42 03/09/2021 Creatinine 1.50, BUN 22, Potassium 4.0, Sodium 139, GFR 36 A complete set of results can be found in Results Review.   Recommendations:  No changes and encouraged to call if experiencing any fluid symptoms.   Follow-up plan: ICM clinic phone appointment on 12/24/2021.   91 day device clinic remote transmission 04/15/2022.    EP/Cardiology Office Visits:   12/04/2021 with HF clinic.  05/03/2022 with Dr. Domenic Polite.   Recall 08/24/2022 with Dr Myles Gip.   Copy of ICM check sent to Dr. Myles Gip.     3 month ICM trend: 11/19/2021.    12-14 Month ICM trend:     [name], RN 11/20/2021 3:56 PM

## 2021-11-29 NOTE — Telephone Encounter (Signed)
Disregard previous note - patient eligible for 2024 re-enrollment.

## 2021-12-03 ENCOUNTER — Telehealth (HOSPITAL_COMMUNITY): Payer: Self-pay

## 2021-12-03 NOTE — Telephone Encounter (Signed)
Called and left patient a voice message to confirm/remind patient of their appointment at the [facility] on 12/04/21.

## 2021-12-04 ENCOUNTER — Encounter (HOSPITAL_COMMUNITY): Payer: Self-pay

## 2021-12-04 ENCOUNTER — Other Ambulatory Visit (HOSPITAL_COMMUNITY): Payer: Self-pay

## 2021-12-04 ENCOUNTER — Ambulatory Visit (HOSPITAL_COMMUNITY)
Admission: RE | Admit: 2021-12-04 | Discharge: 2021-12-04 | Disposition: A | Discharge status: Home / Self Care | Payer: Medicare Other | Source: Ambulatory Visit | Attending: Family Medicine | Admitting: Family Medicine

## 2021-12-04 VITALS — BP 110/68 | HR 67 | Wt 181.6 lb

## 2021-12-04 DIAGNOSIS — Z7901 Long term (current) use of anticoagulants: Secondary | ICD-10-CM | POA: Insufficient documentation

## 2021-12-04 DIAGNOSIS — Z79899 Other long term (current) drug therapy: Secondary | ICD-10-CM | POA: Diagnosis not present

## 2021-12-04 DIAGNOSIS — I428 Other cardiomyopathies: Secondary | ICD-10-CM | POA: Diagnosis present

## 2021-12-04 DIAGNOSIS — R42 Dizziness and giddiness: Secondary | ICD-10-CM | POA: Insufficient documentation

## 2021-12-04 DIAGNOSIS — I480 Paroxysmal atrial fibrillation: Secondary | ICD-10-CM

## 2021-12-04 DIAGNOSIS — D509 Iron deficiency anemia, unspecified: Secondary | ICD-10-CM

## 2021-12-04 DIAGNOSIS — I493 Ventricular premature depolarization: Secondary | ICD-10-CM

## 2021-12-04 DIAGNOSIS — R55 Syncope and collapse: Secondary | ICD-10-CM | POA: Diagnosis not present

## 2021-12-04 DIAGNOSIS — M179 Osteoarthritis of knee, unspecified: Secondary | ICD-10-CM | POA: Diagnosis not present

## 2021-12-04 DIAGNOSIS — D649 Anemia, unspecified: Secondary | ICD-10-CM | POA: Insufficient documentation

## 2021-12-04 DIAGNOSIS — I959 Hypotension, unspecified: Secondary | ICD-10-CM | POA: Diagnosis not present

## 2021-12-04 DIAGNOSIS — R29818 Other symptoms and signs involving the nervous system: Secondary | ICD-10-CM | POA: Diagnosis not present

## 2021-12-04 DIAGNOSIS — I5022 Chronic systolic (congestive) heart failure: Secondary | ICD-10-CM | POA: Diagnosis not present

## 2021-12-04 LAB — BASIC METABOLIC PANEL
Anion gap: 8 (ref 5–15)
BUN: 19 mg/dL (ref 8–23)
CO2: 27 mmol/L (ref 22–32)
Calcium: 8.7 mg/dL — ABNORMAL LOW (ref 8.9–10.3)
Chloride: 103 mmol/L (ref 98–111)
Creatinine, Ser: 1.19 mg/dL — ABNORMAL HIGH (ref 0.44–1.00)
GFR, Estimated: 47 mL/min — ABNORMAL LOW (ref >60)
Glucose, Bld: 132 mg/dL — ABNORMAL HIGH (ref 70–99)
Potassium: 4 mmol/L (ref 3.5–5.1)
Sodium: 138 mmol/L (ref 135–145)

## 2021-12-04 LAB — BRAIN NATRIURETIC PEPTIDE: B Natriuretic Peptide: 535.2 pg/mL — ABNORMAL HIGH (ref 0.0–100.0)

## 2021-12-04 MED ORDER — POTASSIUM CHLORIDE CRYS ER 20 MEQ PO TBCR: 20.0000 meq | EXTENDED_RELEASE_TABLET | ORAL | 3 refills | Status: DC

## 2021-12-04 MED ORDER — VERICIGUAT 5 MG PO TABS: 5.0000 mg | ORAL_TABLET | Freq: Every day | ORAL | 3 refills | Status: DC

## 2021-12-04 MED ORDER — VERICIGUAT 5 MG PO TABS: 5.0000 mg | ORAL_TABLET | Freq: Every day | ORAL | 7 refills | Status: DC

## 2021-12-04 NOTE — Patient Instructions (Signed)
Thank you for coming in today  Labs were done today, if any labs are abnormal the clinic will call you No news is good news  RESTART Veriquat 5 mg 1 tablet daily   You have been referred to [facility] their office will call you to schedule a appointment   TAKE extra Lasix 80 mg Tuesday Thursday and Saturday this week TAKE 20 meq of Potassium with extra does of lasix only  Your physician recommends that you schedule a follow-up appointment in:  4 months with Dr. Kendall Flack will receive a reminder letter in the mail a few months in advance. If you don't receive a letter, please call our office to schedule the follow-up appointment.    Do the following things EVERYDAY: Weigh yourself in the morning before breakfast. Write it down and keep it in a log. Take your medicines as prescribed Eat low salt foods--Limit salt (sodium) to 2000 mg per day.  Stay as active as you can everyday Limit all fluids for the day to less than 2 liters  At the [facility], you and your health needs are our priority. As part of our continuing mission to provide you with exceptional heart care, we have created designated Provider Care Teams. These Care Teams include your primary Cardiologist (physician) and Advanced Practice Providers (APPs- Physician Assistants and Nurse Practitioners) who all work together to provide you with the care you need, when you need it.   You may see any of the following providers on your designated Care Team at your next follow up: Dr Glori Bickers Dr Loralie Champagne Dr. Roxana Hires, NP Lyda Jester, [place] [facility] [place], [place] Forestine Na, NP Audry Riles, PharmD   Please be sure to bring in all your medications bottles to every appointment.   If you have any questions or concerns before your next appointment please send [place] a message through [place] or call our office at [phone].    TO LEAVE A MESSAGE FOR THE NURSE SELECT  OPTION 2, PLEASE LEAVE A MESSAGE INCLUDING: YOUR NAME DATE OF BIRTH CALL BACK NUMBER REASON FOR CALL**this is important as we prioritize the call backs  YOU WILL RECEIVE A CALL BACK THE SAME DAY AS LONG AS YOU CALL BEFORE 4:00 PM

## 2021-12-04 NOTE — Progress Notes (Signed)
PCP: Sharion Balloon, FNP Cardiology: Dr. Domenic Polite EP: Dr. [name] HF Cardiology: Dr. Aundra Dubin  76 y.o. with history of CHF/nonischemic cardiomyopathy, paroxysmal atrial fibrillation, and VT referred by Dr. [name] for CHF evaluation. Cardiomyopathy was diagnosed years ago, she had an echo in 8/13 with EF 35-40%.  Due to LBBB, she had St Jude BiV ICD placed.  Cath in 2016 showed no significant coronary disease.Echo in 8/20 showed a severely dilated LV with EF 25-30%.    In 1/21, she had an episode of polymorphic VT with syncope, ICD discharged appropriately. This was in the setting of GI illness with low K, amiodarone was not started.   She has had a clinical decline over the last year.  She has struggled recently with dyspnea.  Using rolling walker, has to stop several times walking into the office.  She is short of breath walking around her house.  She does report chest tightness along with the dyspnea when she walks.   Arranged for LHC/RHC in 3/21.  This showed no significant CAD and low filling pressures along with preserved cardiac output.  Echo in 7/21 showed EF 35%, moderate LV dilation, normal RV.   She snores and has daytime sleepiness, but has not wanted sleep study and says she would not wear CPAP.   RHC in 10/22 showed low filling pressures and preserved cardiac output. Echo 10/22 showed EF remains 25-30% with mildly decreased RV systolic function. Echo (10/23): EF 25-30%, grade II DD, RV normal, moderate MR  Today she returns for HF follow up with her sister. Overall feeling fair. Remains SOB with minimal activity. She has dyspnea walking from her living room to kitchen. She is able to do housework if she takes her time. She is chronically dizzy. Occasional chest tightness. Main complaint is poor sleep and knee pain. Asking for sleep medication and pain medication for knee. Denies palpitations, CP, or PND/Orthopnea. Chronically sleeps on 3 pillows.  Appetite ok. No fever or chills. Weight  at home 172-181 pounds. Taking all medications but has been out of [place] x 2 weeks. Takes an extra Lasix 2x/week. Has BRBpR, has GI follow up soon.  St Jude device interrogation: 92% BiV pacing, no VT, thoracic impedance decreased suggesting volume overload.    ECG (personally reviewed): BiV paced, 73 bpm  Labs (2/21): K 3.9, creatinine 1.11 Labs (3/21): K 4.3, creatinine 1.17 Labs (8/22): K 3.7, creatinine 1.3 Labs (9/22): BNP 248, K 3.8, creatinine 1.15 Labs (9/23): K 4.8, creatinine 1.17, LDL 97   PMH: 1. Chronic systolic CHF: Nonischemic cardiomyopathy.  St Jude BiV ICD.  - Echo (8/13): EF 35-40%.  - LHC/RHC (3/16): No significant CAD.  Mean RA 4, PA 22/8, mean PCWP 11, CI 3.4.  - Echo (8/20): EF 25-30%, severe LV dilation, normal RV.  - LHC/RHC (3/21): No significant CAD.  Mean RA 1, PA 20/5, mean PCWP 9, CI 3.7.  - Echo (7/21): EF 35%, moderate LV dilation, normal RV.  - RHC (10/22): mean RA 1, PA 19/6, mean PCWP 6, CI 3.34 - Echo (10/22): EF 25-30%, moderate LV dilation, mildly decreased RV systolic function, normal IVC.  - Echo (10/23): EF 25-30%, grade II DD, RV normal, moderate MR 2. 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  3. Hyperlipidemia 4. Hypothyroidism 5. Atrial fibrillation: Paroxysmal.  She has refused anticoagulation.  6. PVCs: 8.2% noted on last device check, did not tolerate increase in ventricular rate to try to suppress. 7. PFTs (3/21): Minimal obstruction  Social History   Socioeconomic  History   Marital status: Widowed    Spouse name: Not on file   Number of children: 2   Years of education: Not on file   Highest education level: Not on file  Occupational History   Not on file  Tobacco Use   Smoking status: Former    Packs/day: 0.30    Years: 20.00    Total pack years: 6.00    Types: Cigarettes    Start date: 08/03/1949    Quit date: 01/22/1996    Years since quitting: 25.8    Passive exposure: Never   Smokeless tobacco: Never  Vaping Use   Vaping Use:  Never used  Substance and Sexual Activity   Alcohol use: No    Alcohol/week: 0.0 standard drinks of alcohol   Drug use: No   Sexual activity: Not Currently    Birth control/protection: Surgical    Comment: hyst  Other Topics Concern   Not on file  Social History Narrative   Her daughter, Terrence Dupont lives with her - takes her to all visits and cooks for her.   Social Determinants of Health   Financial Resource Strain: Low Risk  (07/18/2021)   Overall Financial Resource Strain (CARDIA)    Difficulty of Paying Living Expenses: Not hard at all  Food Insecurity: No Food Insecurity (07/18/2021)   Hunger Vital Sign    Worried About Running Out of Food in the Last Year: Never true    Ran Out of Food in the Last Year: Never true  Transportation Needs: No Transportation Needs (07/18/2021)   PRAPARE - Hydrologist (Medical): No    Lack of Transportation (Non-Medical): No  Physical Activity: Sufficiently Active (07/18/2021)   Exercise Vital Sign    Days of Exercise per Week: 5 days    Minutes of Exercise per Session: 30 min  Stress: No Stress Concern Present (07/18/2021)   [place]    Feeling of Stress : Not at all  Social Connections: [place] (07/18/2021)   Social Connection and Isolation Panel [NHANES]    Frequency of Communication with Friends and Family: More than three times a week    Frequency of Social Gatherings with Friends and Family: More than three times a week    Attends Religious Services: More than 4 times per year    Active Member of [employer] or Organizations: Yes    Attends Music therapist: More than 4 times per year    Marital Status: Married  Human resources officer Violence: Not At Risk (07/18/2021)   Humiliation, Afraid, Rape, and Kick questionnaire    Fear of Current or Ex-Partner: No    Emotionally Abused: No    Physically Abused: No    Sexually Abused: No    Family History  Problem Relation Age of Onset   CVA Mother        multiple   Heart Problems Mother        "weak heart"   Heart disease Maternal Grandmother    Heart disease Maternal Grandfather    Cancer Father        Stomach and eshpogus   Diabetes Neg Hx    Hypertension Neg Hx    Coronary artery disease Neg Hx    ROS: All systems reviewed and negative except as per HPI.   Current Outpatient Medications  Medication Sig Dispense Refill   acetaminophen (TYLENOL) 650 MG CR tablet Take 650 mg by mouth every  8 (eight) hours as needed for pain.     apixaban (ELIQUIS) 5 MG TABS tablet Take 1 tablet (5 mg total) by mouth 2 (two) times daily. Need to schedule an appointment for further refills 180 tablet 0   blood glucose meter kit and supplies 1 each by Other route 2 (two) times daily as needed for other. Dispense based on patient and insurance preference. . (FOR ICD-10 E10.9, E11.9).Dispense based on patient and insurance preference. 1 each 0   buPROPion (WELLBUTRIN XL) 150 MG 24 hr tablet Take 1 tablet (150 mg total) by mouth daily. 90 tablet 1   carvedilol (COREG) 12.5 MG tablet Take 1 tablet by mouth twice daily 180 tablet 3   dapagliflozin propanediol (FARXIGA) 10 MG TABS tablet Take 1 tablet (10 mg total) by mouth daily. 90 tablet 6   estradiol (ESTRACE) 0.1 MG/GM vaginal cream Place 1 g vaginally 2 (two) times a week.     furosemide (LASIX) 80 MG tablet TAKE 1 TABLET BY MOUTH ONCE DAILY AS NEEDED FOR  EXTRA  FLUID 90 tablet 0   glucose blood (GLUCOSE METER TEST) test strip Use BID 100 each 12   hydrOXYzine (VISTARIL) 25 MG capsule TAKE 1 CAPSULE BY MOUTH THREE TIMES DAILY (Patient taking differently: Take 25 mg by mouth daily.) 90 capsule 1   levothyroxine (SYNTHROID) 88 MCG tablet TAKE 1 TABLET BY MOUTH ONCE DAILY BEFORE BREAKFAST 90 tablet 3   metFORMIN (GLUCOPHAGE-XR) 750 MG 24 hr tablet Take 1 tablet (750 mg total) by mouth 2 (two) times daily. 180 tablet 3   pantoprazole  (PROTONIX) 40 MG tablet TAKE 1 TABLET BY MOUTH ONCE DAILY BEFORE BREAKFAST 90 tablet 0   spironolactone (ALDACTONE) 25 MG tablet Take 1 tablet by mouth once daily 90 tablet 11   No current facility-administered medications for this encounter.   Wt Readings from Last 3 Encounters:  12/04/21 82.4 kg (181 lb 9.6 oz)  10/30/21 79.7 kg (175 lb 12.8 oz)  10/12/21 81.5 kg (179 lb 9.6 oz)   BP 110/68   Pulse 67   Wt 82.4 kg (181 lb 9.6 oz)   SpO2 98%   BMI 32.69 kg/m  Physical Exam General:  NAD. No resp difficulty, walked into clinic with cane HEENT: Normal Neck: Supple. JVP 7-8 Carotids 2+ bilat; no bruits. No lymphadenopathy or thryomegaly appreciated. Cor: PMI nondisplaced. Regular rate & rhythm. No rubs, gallops or murmurs. Lungs: Clear Abdomen: Soft, nontender, nondistended. No hepatosplenomegaly. No bruits or masses. Good bowel sounds. Extremities: No cyanosis, clubbing, rash,1+ pedal edema Neuro: Alert & oriented x 3, cranial nerves grossly intact. Moves all 4 extremities w/o difficulty. Affect pleasant.  Assessment/Plan: 1. Chronic systolic CHF: Nonischemic cardiomyopathy by cath in 2016 and again in 3/21.  Low EF back to at least 2013.  Echo in 8/20 with EF 25-30%, severe LV dilation.  RHC in 3/21 showed preserved cardiac output and low filling pressures.  Echo in 7/21 showed EF 35%, moderate LV dilation, normal RV.   St Jude BiV ICD. RHC in 10/22 showed normal filling pressures and preserved cardiac output.  Echo 7/23 showed EF 25-30% with mild RV dysfunction.Cannot definitively explain her exertional symptoms by cardiac findings.  She also had PFTs in 3/21 that showed minimal obstruction. Dr. Elsworth Soho saw her in 7/21 and thought symptoms were due to deconditioning (no significant COPD). BiV pacing percentage today is low 92%, possibly due to increased PVCs.  She is still significantly symptomatic, NYHA class III, she is mildly volume  overloaded by exam and CorVue. - Continue Lasix 80 mg  daily. Take extra 80 mg + 20 KCL on T/Th/Sat this week. BMET and BNP today. - Restart vericiguat 5 mg daily (previously on 7.5 mg daily). - Continue Coreg 12.5 mg bid.   - Continue spironolactone 25 mg daily.  - Continue dapagliflozin 10 mg daily.  - Entresto stopped due to lightheadedness and low BP.   - She is off digoxin due to elevated level and normal cardiac output on RHC.   - She is not interested in the ANTHEM study.  - Deconditioning may play a major role in her symptoms.  She declines referral to Cardiac Rehab. 2. VT/PVCs: She had polymorphic VT in setting of low K/GI illness.  Syncope with appropriate ICD discharge. BiV pacing is 92% today. - She was unable to wear 7 day Zio monitor to assess PVC burden.   3. Suspect OSA: Discussed sleep study today. She will think about it, but she is not sure she would be willing to wear CPAP. Also discussed referral to Sleep Medicine to discuss her insomnia, she declined. 4. Atrial fibrillation: Paroxysmal.  NSR today.    - Continue Eliquis 5 mg bid.  5. Knee OA: Refer to [facility]. 6. Anemia: Recent hgb 9.2 and FOBT+. She has follow up with GI soon.  Followup in 4 months with Dr. Aundra Dubin.  Maricela Bo [facility] FNP-BC 12/04/2021

## 2021-12-07 ENCOUNTER — Telehealth (HOSPITAL_COMMUNITY): Payer: Self-pay | Admitting: Pharmacy Technician

## 2021-12-07 NOTE — Telephone Encounter (Signed)
Advanced Heart Failure Patient Advocate Encounter  Received call from [employer] that they received information for this patient and we would need to call back. Called and spoke with representative. She stated the patient is not enrolled in their program. I read the approval letter that we received stating she was approved until 01/20/22. The patient's refill was delayed due to needing a new RX. This was sent in on 11/14. She is going to send the pharmacy the RX and advised for the patient to call back next week.  Charlann Boxer, CPhT

## 2021-12-12 ENCOUNTER — Other Ambulatory Visit: Payer: Self-pay | Admitting: Family

## 2021-12-20 ENCOUNTER — Ambulatory Visit (INDEPENDENT_AMBULATORY_CARE_PROVIDER_SITE_OTHER): Payer: Medicare Other | Admitting: Orthopaedic Surgery

## 2021-12-20 ENCOUNTER — Ambulatory Visit (INDEPENDENT_AMBULATORY_CARE_PROVIDER_SITE_OTHER): Payer: Medicare Other

## 2021-12-20 ENCOUNTER — Ambulatory Visit: Payer: Self-pay

## 2021-12-20 ENCOUNTER — Encounter: Payer: Self-pay | Admitting: Orthopaedic Surgery

## 2021-12-20 VITALS — Ht 62.5 in | Wt 180.0 lb

## 2021-12-20 DIAGNOSIS — M170 Bilateral primary osteoarthritis of knee: Secondary | ICD-10-CM | POA: Diagnosis not present

## 2021-12-20 DIAGNOSIS — M25561 Pain in right knee: Secondary | ICD-10-CM

## 2021-12-20 DIAGNOSIS — G8929 Other chronic pain: Secondary | ICD-10-CM | POA: Diagnosis not present

## 2021-12-20 DIAGNOSIS — M25562 Pain in left knee: Secondary | ICD-10-CM

## 2021-12-20 MED ORDER — METHYLPREDNISOLONE ACETATE 40 MG/ML IJ SUSP
40.0000 mg | INTRAMUSCULAR | Status: AC | PRN
  Administered 2021-12-20: 40 mg via INTRA_ARTICULAR

## 2021-12-20 MED ORDER — BUPIVACAINE HCL 0.5 % IJ SOLN
3.0000 mL | INTRAMUSCULAR | Status: AC | PRN
  Administered 2021-12-20: 3 mL via INTRA_ARTICULAR

## 2021-12-20 MED ORDER — LIDOCAINE HCL 1 % IJ SOLN
0.5000 mL | INTRAMUSCULAR | Status: AC | PRN
  Administered 2021-12-20: .5 mL

## 2021-12-20 NOTE — Progress Notes (Signed)
Office Visit Note   Patient: Melissa Paul           Date of Birth: 1945/04/27           MRN: [id number] Visit Date: 12/20/2021              Requested by: Rafael Bihari, FNP [address],  [place] [address] PCP: Sharion Balloon, FNP   Assessment & Plan: Visit Diagnoses:  1. Chronic pain of both knees   2. Bilateral primary osteoarthritis of knee     Plan: Injection performed she can follow-up if she would like to have the opposite knee injected.  Follow-Up Instructions: No follow-ups on file.   Orders:  Orders Placed This Encounter  Procedures   XR KNEE 3 VIEW LEFT   XR Knee 1-2 Views Right   No orders of the defined types were placed in this encounter.     Procedures: Large Joint Inj: L knee on 12/20/2021 10:07 AM Indications: joint swelling and pain Details: 22 G 1.5 in needle, anterolateral approach  Arthrogram: No  Medications: 0.5 mL lidocaine 1 %; 3 mL bupivacaine 0.5 %; 40 mg methylPREDNISolone acetate 40 MG/ML Outcome: tolerated well, no immediate complications Procedure, treatment alternatives, risks and benefits explained, specific risks discussed. Consent was given by the patient. Immediately prior to procedure a time out was called to verify the correct patient, procedure, equipment, support staff and site/side marked as required. Patient was prepped and draped in the usual sterile fashion.       Clinical Data: No additional findings.   Subjective: Chief Complaint  Patient presents with   Right Knee - Pain   Left Knee - Pain    HPI 76 year old female here with her daughter with bilateral knee osteoarthritis.  She has acute on chronic heart failure.  Ejection fraction severe between 25 to 40%.  She has a biventricular pacemaker bone-on-bone changes both knees more left knee than right knee pain.  History of injections years ago has not worked worked well initially but more recently last year not as well.  She has some mitral  regurgitation, cardiomyopathy, stage III kidney disease.  She states her cardiologist told her she is not an operative candidate due to her heart problems.  We reviewed her ejection fractions and I I agree.  Patient is amatory with a rolling walker.  Recently she has had some increased pain over the trochanter on the right side that radiates into the thigh.  Left knee is most painful area for her currently.  Review of Systems all the systems noncontributory to HPI.   Objective: Vital Signs: Ht 5' 2.5" (1.588 m)   Wt 180 lb (81.6 kg)   BMI 32.40 kg/m   Physical Exam Constitutional:      Appearance: She is well-developed.  HENT:     Head: Normocephalic.     Right Ear: External ear normal.     Left Ear: External ear normal. There is no impacted cerumen.  Eyes:     Pupils: Pupils are equal, round, and reactive to light.  Neck:     Thyroid: No thyromegaly.     Trachea: No tracheal deviation.  Cardiovascular:     Rate and Rhythm: Normal rate.  Pulmonary:     Effort: Pulmonary effort is normal.  Abdominal:     Palpations: Abdomen is soft.  Musculoskeletal:     Cervical back: No rigidity.  Skin:    General: Skin is warm and dry.  Neurological:     Mental Status: She is alert and oriented to person, place, and time.  Psychiatric:        Behavior: Behavior normal.     Ortho Exam bilateral knee crepitus.  Bilateral knee varus.  Negative logroll hips.  Specialty Comments:  No specialty comments available.  Imaging: XR KNEE 3 VIEW LEFT  Result Date: 12/20/2021 Standing AP both knees lateral left knee and sunrise patella x-ray demonstrates tricompartmental degenerative arthritis with bone-on-bone medial compartment. Impression: Left knee primary osteoarthritis with varus deformity.  XR Knee 1-2 Views Right  Result Date: 12/20/2021 Standing AP both knees lateral right knee sunrise patella x-ray demonstrates bone-on-bone medial compartment 10 degrees varus marginal osteophytes  tricompartmental. Impression: Right knee osteoarthritis with varus bone-on-bone medial compartment.    PMFS History: Patient Active Problem List   Diagnosis Date Noted   Bilateral primary osteoarthritis of knee 12/20/2021   Gastroesophageal reflux disease 10/12/2021   Atherosclerosis of aorta (HCC) 10/12/2021   Stage 3b chronic kidney disease ([place]) 03/29/2021   Vaginal vault prolapse after hysterectomy 82/95/6213   Lichen sclerosus et atrophicus 10/17/2020   Cystocele, midline 09/22/2020   Recurrent UTI 04/26/2020   Osteoporosis 11/16/2019   GAD (generalized anxiety disorder) 10/19/2018   Constipation 11/12/2017   Cyst of right kidney 11/12/2017   Type 2 diabetes mellitus with hyperglycemia, without long-term current use of insulin ([place]) 08/11/2017   Acute on chronic systolic ACC/AHA stage C congestive heart failure ([place]) 06/08/2014   Abnormal myocardial perfusion study    Exertional dyspnea - NYHA III CHF 04/11/2014   Abnormal nuclear stress test 04/11/2014   Ventricular tachyarrhythmia ([place]) 03/17/2014   Atrial fibrillation ([place]) 10/10/2011   Chronic hypotension 03/14/2011   Automatic implantable cardioverter-defibrillator in situ 09/13/2010   MITRAL REGURGITATION 08/65/7846   Chronic systolic heart failure ([place]) 07/14/2009   Hypothyroidism 11/29/2008   Nonischemic cardiomyopathy ([place]) 11/29/2008   LBBB 11/29/2008   Past Medical History:  Diagnosis Date   Anxiety    Chronic systolic heart failure (HCC)    Constipation    Cyst of right kidney 11/12/2017   Glucose intolerance (pre-diabetes)    Hyperlipemia    Hypothyroidism    LBBB (left bundle branch block)    Mitral regurgitation    Mild to Moderate   Nonischemic cardiomyopathy ([place])    LVEF 35-40% by echo 8/13   Paroxysmal atrial fibrillation (HCC)    Brief episodes by device interrogation   UTI (urinary tract infection)    Recurrent   Ventricular tachycardia ([place])    ICD therapy for VT    Family History   Problem Relation Age of Onset   CVA Mother        multiple   Heart Problems Mother        "weak heart"   Heart disease Maternal Grandmother    Heart disease Maternal Grandfather    Cancer Father        Stomach and eshpogus   Diabetes Neg Hx    Hypertension Neg Hx    Coronary artery disease Neg Hx     Past Surgical History:  Procedure Laterality Date   APPENDECTOMY     BIV ICD GENERATOR CHANGEOUT N/A 07/11/2016   Procedure: BiV ICD Generator Changeout;  Surgeon: Thompson Grayer, MD;  Location: [facility];  Service: Cardiovascular;  Laterality: N/A;   CARDIAC DEFIBRILLATOR PLACEMENT  09/2009   St[employer] Jude BiV ICD by Dr. Sandria Manly class III   CHOLECYSTECTOMY     has one  ovary left     KNEE ARTHROSCOPY     Left   LEFT AND RIGHT HEART CATHETERIZATION WITH CORONARY ANGIOGRAM N/A 04/12/2014   Procedure: LEFT AND RIGHT HEART CATHETERIZATION WITH CORONARY ANGIOGRAM;  Surgeon: Leonie Man, MD;  Location[facility];  Service: Cardiovascular;  Laterality: N/A;   Left breast biopsy x 2     no maliganancy   PACEMAKER IMPLANT     RIGHT HEART CATH N/A 10/27/2020   Procedure: RIGHT HEART CATH;  Surgeon: [name], MD;  Location: [facility];  Service: Cardiovascular;  Laterality: N/A;   RIGHT/LEFT HEART CATH AND CORONARY ANGIOGRAPHY N/A 03/26/2019   Procedure: RIGHT/LEFT HEART CATH AND CORONARY ANGIOGRAPHY;  Surgeon: [name], MD;  Location: [facility];  Service: Cardiovascular;  Laterality: N/A;   TOTAL ABDOMINAL HYSTERECTOMY     (R) ovary removed   Social History   Occupational History   Not on file  Tobacco Use   Smoking status: Former    Packs/day: 0.30    Years: 20.00    Total pack years: 6.00    Types: Cigarettes    Start date: 08/03/1949    Quit date: 01/22/1996    Years since quitting: 25.9    Passive exposure: Never   Smokeless tobacco: Never  Vaping Use   Vaping Use: Never used  Substance and Sexual Activity   Alcohol use: No     Alcohol/week: 0.0 standard drinks of alcohol   Drug use: No   Sexual activity: Not Currently    Birth control/protection: Surgical    Comment: hyst

## 2021-12-24 ENCOUNTER — Encounter: Payer: Self-pay | Admitting: *Deleted

## 2021-12-24 ENCOUNTER — Ambulatory Visit (INDEPENDENT_AMBULATORY_CARE_PROVIDER_SITE_OTHER): Payer: Medicare Other | Admitting: Gastroenterology

## 2021-12-24 ENCOUNTER — Ambulatory Visit (INDEPENDENT_AMBULATORY_CARE_PROVIDER_SITE_OTHER): Payer: Medicare Other

## 2021-12-24 ENCOUNTER — Encounter: Payer: Self-pay | Admitting: Gastroenterology

## 2021-12-24 VITALS — BP 93/59 | HR 69 | Temp 98.2°F | Ht 62.5 in | Wt 179.2 lb

## 2021-12-24 DIAGNOSIS — Z95810 Presence of automatic (implantable) cardiac defibrillator: Secondary | ICD-10-CM | POA: Diagnosis not present

## 2021-12-24 DIAGNOSIS — D509 Iron deficiency anemia, unspecified: Secondary | ICD-10-CM | POA: Diagnosis not present

## 2021-12-24 DIAGNOSIS — I5022 Chronic systolic (congestive) heart failure: Secondary | ICD-10-CM | POA: Diagnosis not present

## 2021-12-24 DIAGNOSIS — K625 Hemorrhage of anus and rectum: Secondary | ICD-10-CM | POA: Diagnosis not present

## 2021-12-24 DIAGNOSIS — G8929 Other chronic pain: Secondary | ICD-10-CM | POA: Insufficient documentation

## 2021-12-24 DIAGNOSIS — R195 Other fecal abnormalities: Secondary | ICD-10-CM

## 2021-12-24 DIAGNOSIS — R1013 Epigastric pain: Secondary | ICD-10-CM

## 2021-12-24 MED ORDER — PEG 3350-KCL-NA BICARB-NACL 420 G PO SOLR: 4000.0000 mL | Freq: Once | ORAL | 0 refills | Status: AC

## 2021-12-24 NOTE — Patient Instructions (Signed)
Please have labs done at [facility]. We will be in touch with results.  Plans for colonoscopy and possible upper endoscopy with Dr. Jenetta Downer. We will call to schedule.

## 2021-12-24 NOTE — Progress Notes (Addendum)
GI Office Note    Referring Provider: Sharion Balloon, FNP Primary Care Physician:  Sharion Balloon, FNP  Primary Gastroenterologist: formerly Dr. Laural Golden. Requesting Dr. Jenetta Downer  Chief Complaint   Chief Complaint  Patient presents with   New Patient (Initial Visit)    Pt here for pos fecal occult test     History of Present Illness   Melissa Paul is a 76 y.o. female presenting today at the request of Evelina Dun, NP for further evaluation of heme positive stool. Patient reports remote colonoscopy almost 25 years ago and history of remote ulcers. Complicated PMH consistent of Afib on Eliquis, nonischemic cardiomyopathy, s/p St[employer] Jude biventricular ICD with history of PVCs and VT.   Labs from September with hemoglobin of 9.2, hematocrit 28.6, MCV 74.  In March 2023 her hemoglobin was 10.2.  Looks like she has not been in the 10-11 range for the past year.  No anemia labs otherwise done.  Reports chronic rectal bleeding she feels is from hemorrhoids.  Symptoms more regularly over the past 2 years.  Feels like she passes a significant amount of blood per rectum.  Wears pads to protect her clothing.  Bowel function has improved over the years.  She is having a stool about every other day.  Stools range from [facility] 1 to loose.  Typically does not have more than 2-3 stools per day.  Has to strain at times.  Has been reluctant to have a colonoscopy because last time she had one done she had rectal pain for weeks afterwards.  Chronically has dealt with stomach pain.  Not necessarily worse with meals.  Worse with palpation.  Takes pantoprazole mostly once daily for reflux.  No dysphagia.  No vomiting.  Blood pressures chronically low normal.  She has a history of several adjacent epigastric hernias containing adipose tissue with total measurement of 10.8 x 4.5 x 5.2 cm on December 2020 CT.   Medications   Current Outpatient Medications  Medication Sig Dispense Refill    acetaminophen (TYLENOL) 650 MG CR tablet Take 650 mg by mouth every 8 (eight) hours as needed for pain.     apixaban (ELIQUIS) 5 MG TABS tablet Take 1 tablet (5 mg total) by mouth 2 (two) times daily. Need to schedule an appointment for further refills 180 tablet 0   blood glucose meter kit and supplies 1 each by Other route 2 (two) times daily as needed for other. Dispense based on patient and insurance preference. . (FOR ICD-10 E10.9, E11.9).Dispense based on patient and insurance preference. 1 each 0   carvedilol (COREG) 12.5 MG tablet Take 1 tablet by mouth twice daily 180 tablet 3   dapagliflozin propanediol (FARXIGA) 10 MG TABS tablet Take 1 tablet (10 mg total) by mouth daily. 90 tablet 6   furosemide (LASIX) 80 MG tablet TAKE 1 TABLET BY MOUTH ONCE DAILY AS NEEDED FOR  EXTRA  FLUID 90 tablet 0   glucose blood (GLUCOSE METER TEST) test strip Use BID 100 each 12   hydrOXYzine (VISTARIL) 25 MG capsule TAKE 1 CAPSULE BY MOUTH THREE TIMES DAILY (Patient taking differently: Take 25 mg by mouth daily.) 90 capsule 1   levothyroxine (SYNTHROID) 88 MCG tablet TAKE 1 TABLET BY MOUTH ONCE DAILY BEFORE BREAKFAST 90 tablet 3   metFORMIN (GLUCOPHAGE-XR) 750 MG 24 hr tablet Take 1 tablet (750 mg total) by mouth 2 (two) times daily. 180 tablet 3   pantoprazole (PROTONIX) 40 MG tablet TAKE 1  TABLET BY MOUTH ONCE DAILY BEFORE BREAKFAST 90 tablet 0   potassium chloride SA (KLOR-CON M) 20 MEQ tablet Take 1 tablet (20 mEq total) by mouth as directed. Only take 1 tablet with extra doses of Lasix 30 tablet 3   spironolactone (ALDACTONE) 25 MG tablet Take 1 tablet by mouth once daily 90 tablet 11   Vericiguat 5 MG TABS Take 1 tablet (5 mg total) by mouth daily. 90 tablet 3   No current facility-administered medications for this visit.    Allergies   Allergies as of 12/24/2021 - Review Complete 12/24/2021  Allergen Reaction Noted   Buspar [buspirone] Other (See Comments) 10/08/2018   Lexapro [escitalopram  oxalate] Other (See Comments) 84/13/2440   Trulicity [dulaglutide] Hives and Itching 02/01/2021   Bactrim Rash 09/13/2010   Cephalexin Swelling and Rash    Chlorhexidine gluconate Hives and Itching 07/11/2016   Clindamycin Rash    Contrast media [iodinated contrast media] Rash 09/13/2010   Crestor [rosuvastatin] Hives and Rash 10/30/2021   Doxycycline Rash 02/27/2017   Latex Rash    Levofloxacin Rash    Nitrofurantoin monohyd macro Rash 09/13/2010   Penicillins Swelling and Rash     Past Medical History   Past Medical History:  Diagnosis Date   Anxiety    Chronic systolic heart failure (HCC)    Constipation    Cyst of right kidney 11/12/2017   Glucose intolerance (pre-diabetes)    Hyperlipemia    Hypothyroidism    LBBB (left bundle branch block)    Mitral regurgitation    Mild to Moderate   Nonischemic cardiomyopathy ([place])    LVEF 35-40% by echo 8/13   Paroxysmal atrial fibrillation (HCC)    Brief episodes by device interrogation   UTI (urinary tract infection)    Recurrent   Ventricular tachycardia ([place])    ICD therapy for VT    Past Surgical History   Past Surgical History:  Procedure Laterality Date   APPENDECTOMY     BIV ICD GENERATOR CHANGEOUT N/A 07/11/2016   Procedure: BiV ICD Generator Changeout;  Surgeon: Thompson Grayer, MD;  Location: [place] [facility];  Service: Cardiovascular;  Laterality: N/A;   CARDIAC DEFIBRILLATOR PLACEMENT  09/2009   St. Jude BiV ICD by Dr. Sandria Manly class III   CHOLECYSTECTOMY     has one ovary left     KNEE ARTHROSCOPY     Left   LEFT AND RIGHT HEART CATHETERIZATION WITH CORONARY ANGIOGRAM N/A 04/12/2014   Procedure: LEFT AND RIGHT HEART CATHETERIZATION WITH CORONARY ANGIOGRAM;  Surgeon: Leonie Man, MD;  Location[facility];  Service: Cardiovascular;  Laterality: N/A;   Left breast biopsy x 2     no maliganancy   PACEMAKER IMPLANT     RIGHT HEART CATH N/A 10/27/2020   Procedure: RIGHT HEART CATH;  Surgeon: [name], MD;  Location: [place] [facility];  Service: Cardiovascular;  Laterality: N/A;   RIGHT/LEFT HEART CATH AND CORONARY ANGIOGRAPHY N/A 03/26/2019   Procedure: RIGHT/LEFT HEART CATH AND CORONARY ANGIOGRAPHY;  Surgeon: [name], MD;  Location: [place] [facility];  Service: Cardiovascular;  Laterality: N/A;   TOTAL ABDOMINAL HYSTERECTOMY     (R) ovary removed    Past Family History   Family History  Problem Relation Age of Onset   CVA Mother        multiple   Heart Problems Mother        "weak heart"   Heart disease Maternal Grandmother  Heart disease Maternal Grandfather    Cancer Father        Stomach and eshpogus   Diabetes Neg Hx    Hypertension Neg Hx    Coronary artery disease Neg Hx     Past Social History   Social History   Socioeconomic History   Marital status: Widowed    Spouse name: Not on file   Number of children: 2   Years of education: Not on file   Highest education level: Not on file  Occupational History   Not on file  Tobacco Use   Smoking status: Former    Packs/day: 0.30    Years: 20.00    Total pack years: 6.00    Types: Cigarettes    Start date: 08/03/1949    Quit date: 01/22/1996    Years since quitting: 25.9    Passive exposure: Never   Smokeless tobacco: Never  Vaping Use   Vaping Use: Never used  Substance and Sexual Activity   Alcohol use: No    Alcohol/week: 0.0 standard drinks of alcohol   Drug use: No   Sexual activity: Not Currently    Birth control/protection: Surgical    Comment: hyst  Other Topics Concern   Not on file  Social History Narrative   Her daughter, Terrence Dupont lives with her - takes her to all visits and cooks for her.   Social Determinants of Health   Financial Resource Strain: Low Risk  (07/18/2021)   Overall Financial Resource Strain (CARDIA)    Difficulty of Paying Living Expenses: Not hard at all  Food Insecurity: No Food Insecurity (07/18/2021)   Hunger Vital Sign    Worried About Running Out of  Food in the Last Year: Never true    Ran Out of Food in the Last Year: Never true  Transportation Needs: No Transportation Needs (07/18/2021)   PRAPARE - Hydrologist (Medical): No    Lack of Transportation (Non-Medical): No  Physical Activity: Sufficiently Active (07/18/2021)   Exercise Vital Sign    Days of Exercise per Week: 5 days    Minutes of Exercise per Session: 30 min  Stress: No Stress Concern Present (07/18/2021)   [place]    Feeling of Stress : Not at all  Social Connections: [place] (07/18/2021)   Social Connection and Isolation Panel [NHANES]    Frequency of Communication with Friends and Family: More than three times a week    Frequency of Social Gatherings with Friends and Family: More than three times a week    Attends Religious Services: More than 4 times per year    Active Member of [employer] or Organizations: Yes    Attends Music therapist: More than 4 times per year    Marital Status: Married  Human resources officer Violence: Not At Risk (07/18/2021)   Humiliation, Afraid, Rape, and Kick questionnaire    Fear of Current or Ex-Partner: No    Emotionally Abused: No    Physically Abused: No    Sexually Abused: No    Review of Systems   General: Negative for anorexia, weight loss, fever, chills, fatigue, weakness.  Positive dizziness Eyes: Negative for vision changes.  ENT: Negative for hoarseness, difficulty swallowing , nasal congestion. CV: Negative for chest pain, angina, palpitations, dyspnea on exertion, peripheral edema.  Respiratory: Negative for dyspnea at rest, dyspnea on exertion, cough, sputum, wheezing.  GI: See history of present illness. GU:  Negative for dysuria, hematuria, urinary incontinence, urinary frequency, nocturnal urination.  MS: Chronic joint pain, low back pain.  Derm: Negative for rash or itching.  Neuro: Negative for  weakness, abnormal sensation, seizure, frequent headaches, memory loss,  confusion.  Psych: Negative for anxiety, depression, suicidal ideation, hallucinations.  Endo: Negative for unusual weight change.  Heme: Negative for bruising or bleeding. Allergy: Negative for rash or hives.  Physical Exam   BP (!) 93/59   Pulse 69   Temp 98.2 F (36.8 C)   Ht 5' 2.5" (1.588 m)   Wt 179 lb 3.2 oz (81.3 kg)   BMI 32.25 kg/m    General: Well-nourished, well-developed in no acute distress.  Ambulates with walker Head: Normocephalic, atraumatic.   Eyes: Conjunctiva pink, no icterus. Mouth: Oropharyngeal mucosa moist and pink , no lesions erythema or exudate. Neck: Supple without thyromegaly, masses, or lymphadenopathy.  Lungs: Clear to auscultation bilaterally.  Heart: Regular rate and rhythm, no murmurs rubs or gallops.  Abdomen: Bowel sounds are normal,  nondistended, no hepatosplenomegaly or masses,  no abdominal bruits,no rebound or guarding.  Moderate epigastric tenderness, diffuse mild tenderness Rectal: Not performed Extremities: No lower extremity edema. No clubbing or deformities.  Neuro: Alert and oriented x 4 , grossly normal neurologically.  Skin: Warm and dry, no rash or jaundice.   Psych: Alert and cooperative, normal mood and affect.  Labs   Lab Results  Component Value Date   CREATININE 1.19 (H) 12/04/2021   BUN 19 12/04/2021   NA 138 12/04/2021   K 4.0 12/04/2021   CL 103 12/04/2021   CO2 27 12/04/2021   Lab Results  Component Value Date   ALT 6 10/12/2021   AST 10 10/12/2021   ALKPHOS 91 10/12/2021   BILITOT 0.2 10/12/2021   Lab Results  Component Value Date   WBC 6.4 10/12/2021   HGB 9.2 (L) 10/12/2021   HCT 28.6 (L) 10/12/2021   MCV 74 (L) 10/12/2021   PLT 253 10/12/2021   No results found for: "IRON", "TIBC", "FERRITIN" No results found for: "VITAMINB12" No results found for: "FOLATE" Lab Results  Component Value Date   HGBA1C 6.6 (H) 10/12/2021     Imaging Studies   XR KNEE 3 VIEW LEFT  Result Date: 12/20/2021 Standing AP both knees lateral left knee and sunrise patella x-ray demonstrates tricompartmental degenerative arthritis with bone-on-bone medial compartment. Impression: Left knee primary osteoarthritis with varus deformity.  XR Knee 1-2 Views Right  Result Date: 12/20/2021 Standing AP both knees lateral right knee sunrise patella x-ray demonstrates bone-on-bone medial compartment 10 degrees varus marginal osteophytes tricompartmental. Impression: Right knee osteoarthritis with varus bone-on-bone medial compartment.   Assessment   Microcytic anemia/rectal bleeding/Hemoccult positive stools: Patient on Eliquis chronically.  She states she has frequent rectal bleeding which she believes is from her hemorrhoids. At times she feels like significant amount of bleeding.  Over 50-monthperiod of time she dropped her hemoglobin by 1 g.  No recent labs.  Chronically has low normal blood pressures.  Having some dizziness in the office today.  We will update labs. Plan for colonoscopy with possible upper endoscopy in the near future.  Per Dr. MMyles Gipoffice note in 10/2021, ok to hold Eliquis 48 hours before.   Epigastric pain: Chronic in nature.  History of several epigastric hernias as outlined above.  Denies postprandial component. Possibly related to epigastric hernias but cannot exclude gastritis, ulcer disease.   PLAN   Colonoscopy with possible upper endoscopy with Dr.  Castaneda in 01/2022 (patient requested after holidays). ASA 3.  I have discussed the risks, alternatives, benefits with regards to but not limited to the risk of reaction to medication, bleeding, infection, perforation and the patient is agreeable to proceed. Written consent to be obtained. Hold eliquis 48 hours before.  Update labs, CBC, anemia labs.   Laureen Ochs. Bobby Rumpf, [place], PA-C [facility]   I have reviewed the note and agree  with the APP's assessment as described in this progress note.  Maylon Peppers, MD Gastroenterology and Hepatology [facility]

## 2021-12-25 ENCOUNTER — Telehealth (HOSPITAL_COMMUNITY): Payer: Self-pay | Admitting: Pharmacy Technician

## 2021-12-25 ENCOUNTER — Encounter: Payer: Self-pay | Admitting: *Deleted

## 2021-12-25 NOTE — Progress Notes (Signed)
EPIC Encounter for ICM Monitoring  Patient Name: Melissa Paul is a 76 y.o. female Date: 12/25/2021 Primary Care Physican: Sharion Balloon, FNP Primary Cardiologist: [facility] Electrophysiologist: Meal[facility] Bi-V Pacing: 92%        09/06/2021 Weight: 179-180 lbs  8/30//2023 Office Weight: 178 lbs 11/12/2021 Weight: 178 lbs 11/20/2021 Weight: 173 lbs 12/25/2021 Weight: 178 lbs   AT/AF Burden: 1.3% (taking Eliquis)         Spoke with patient and heart failure questions reviewed.  Transmission results reviewed.  Pt reports losing 3 lbs and ankle swelling resolved after taking extra Furosemide.  She also takes potassium when taking extra Lasix.  She reports her diet does not change and unsure what causes the fluid accumulation.    CorVue Thoracic impedance suggesting possible fluid accumulation starting 12/2 and also from 11/24-11/30.    Prescribed:  Furosemide 80 mg Take 80 mg by mouth daily as needed.  Pt takes extra if needed. Potassium 20 mEq take 1 tablet by mouth as directed.  Only take 1 tablet with extra doses of Lasix. Spirolactone 25 mg take 1 tablet by mouth daily Farxiga 5 mg take 1 tablet daily   Labs: 12/04/2021 Creatinine 1.19, BUN 19, Potassium 4.0, Sodium 138, GFR 47 10/12/2021 Creatinine 1.17, BUN 16, Potassium 4.8, Sodium 139, GFR 48 03/29/2021 Creatinine 1.32, BUN 15, Potassium 4.2, Sodium 139, GFR 42 03/09/2021 Creatinine 1.50, BUN 22, Potassium 4.0, Sodium 139, GFR 36 A complete set of results can be found in Results Review.   Recommendations:  Advised to call back if fluid accumulation returns.     Follow-up plan: ICM clinic phone appointment on 12/11//2023 to recheck fluid levels.   91 day device clinic remote transmission 04/15/2022.    EP/Cardiology Office Visits:   Recall 04/03/2022.  05/03/2022 with Dr. Domenic Polite.   Recall 08/24/2022 with Dr Myles Gip.   Copy of ICM check sent to Dr. Myles Gip.   3 month ICM trend: 12/24/2021.    12-14 Month ICM trend:      [name], RN 12/25/2021 10:54 AM

## 2021-12-25 NOTE — Telephone Encounter (Addendum)
Advanced Heart Failure Patient Advocate Encounter  Patient received renewal application for Eliquis assistance through [employer]. She did provide OOP for this year. She should not need OOP since she does not show active part D coverage.

## 2021-12-26 NOTE — Addendum Note (Signed)
Addended by: Harvel Quale on: 12/26/2021 01:44 PM   Modules accepted: Level of Service

## 2021-12-28 NOTE — Telephone Encounter (Signed)
Advanced Heart Failure Patient Advocate Encounter  Received request for clarification from [employer]. Spoke with representative and provided confirmation as needed. They will continue processing application.

## 2021-12-31 ENCOUNTER — Ambulatory Visit (INDEPENDENT_AMBULATORY_CARE_PROVIDER_SITE_OTHER): Payer: Medicare Other

## 2021-12-31 DIAGNOSIS — I5022 Chronic systolic (congestive) heart failure: Secondary | ICD-10-CM

## 2021-12-31 DIAGNOSIS — Z95810 Presence of automatic (implantable) cardiac defibrillator: Secondary | ICD-10-CM

## 2021-12-31 NOTE — Progress Notes (Unsigned)
EPIC Encounter for ICM Monitoring  Patient Name: Melissa Paul is a 76 y.o. female Date: 12/31/2021 Primary Care Physican: Sharion Balloon, FNP Primary Cardiologist: [facility] Electrophysiologist: Mealor Bi-V Pacing: 92%        09/06/2021 Weight: 179-180 lbs  8/30//2023 Office Weight: 178 lbs 11/12/2021 Weight: 178 lbs 11/20/2021 Weight: 173 lbs 12/25/2021 Weight: 178 lbs 01/01/2022 Weight: 178 lbs   AT/AF Burden: 1.3% (taking Eliquis)         Spoke with patient and heart failure questions reviewed.  Transmission results reviewed.  Pt reports minor swelling in legs.      CorVue Thoracic impedance suggesting fluid levels returned to normal.    Prescribed:  Furosemide 80 mg Take 80 mg by mouth daily as needed.  Pt takes extra if needed. Potassium 20 mEq take 1 tablet by mouth as directed.  Only take 1 tablet with extra doses of Lasix. Spirolactone 25 mg take 1 tablet by mouth daily Farxiga 5 mg take 1 tablet daily   Labs: 12/04/2021 Creatinine 1.19, BUN 19, Potassium 4.0, Sodium 138, GFR 47 10/12/2021 Creatinine 1.17, BUN 16, Potassium 4.8, Sodium 139, GFR 48 03/29/2021 Creatinine 1.32, BUN 15, Potassium 4.2, Sodium 139, GFR 42 03/09/2021 Creatinine 1.50, BUN 22, Potassium 4.0, Sodium 139, GFR 36 A complete set of results can be found in Results Review.   Recommendations:  No changes and encouraged to call if experiencing any fluid symptoms.   Follow-up plan: ICM clinic phone appointment on 02/04/2022.   91 day device clinic remote transmission 04/15/2022.    EP/Cardiology Office Visits:   Recall 04/03/2022.  05/03/2022 with Dr. Domenic Polite.   Recall 08/24/2022 with Dr Myles Gip.   Copy of ICM check sent to Dr. Myles Gip.    3 month ICM trend: 12/31/2021.    12-14 Month ICM trend:     [name], RN 12/31/2021 8:22 AM

## 2022-01-08 ENCOUNTER — Telehealth (HOSPITAL_COMMUNITY): Payer: Self-pay

## 2022-01-08 NOTE — Telephone Encounter (Signed)
Advanced Heart Failure Patient Advocate Encounter  Application for [facility] faxed to [employer] on 01/07/22. Application form attached to patient chart.  Clista Bernhardt, CPhT Rx Patient Advocate Phone: [phone]

## 2022-01-09 ENCOUNTER — Encounter: Payer: Self-pay | Admitting: Family Medicine

## 2022-01-09 ENCOUNTER — Ambulatory Visit (INDEPENDENT_AMBULATORY_CARE_PROVIDER_SITE_OTHER): Payer: Medicare Other | Admitting: Family Medicine

## 2022-01-09 VITALS — BP 100/63 | HR 131 | Temp 98.6°F | Ht 62.0 in | Wt 176.0 lb

## 2022-01-09 DIAGNOSIS — J069 Acute upper respiratory infection, unspecified: Secondary | ICD-10-CM | POA: Diagnosis not present

## 2022-01-09 DIAGNOSIS — R062 Wheezing: Secondary | ICD-10-CM | POA: Diagnosis not present

## 2022-01-09 MED ORDER — LEVOCETIRIZINE DIHYDROCHLORIDE 5 MG PO TABS: 5.0000 mg | ORAL_TABLET | Freq: Every evening | ORAL | 0 refills | Status: DC

## 2022-01-09 MED ORDER — AZITHROMYCIN 250 MG PO TABS: ORAL_TABLET | ORAL | 0 refills | Status: DC

## 2022-01-09 MED ORDER — ALBUTEROL SULFATE (2.5 MG/3ML) 0.083% IN NEBU: 2.5000 mg | INHALATION_SOLUTION | Freq: Four times a day (QID) | RESPIRATORY_TRACT | 1 refills | Status: AC | PRN

## 2022-01-09 NOTE — Patient Instructions (Signed)

## 2022-01-09 NOTE — Progress Notes (Signed)
Acute Office Visit  Subjective:     Patient ID: Melissa Paul, female    DOB: 1946/01/17, 76 y.o.   MRN: [id number]  Chief Complaint  Patient presents with   URI   Nasal Congestion   Cough   Fever    URI  This is a new problem. Episode onset: 5 days ago. The problem has been gradually worsening. The maximum temperature recorded prior to her arrival was 100.4 - 100.9 F (yesterday, afebrile today). Associated symptoms include congestion, coughing (productive- green), ear pain, nausea and wheezing. Pertinent negatives include no abdominal pain, chest pain, diarrhea, headaches, sore throat or vomiting. She has tried acetaminophen and sleep for the symptoms. The treatment provided no relief.  She reports shortness of breath that is worse than usual for her. Reports a ? history of asthma. She has a nebulizer machine at home but doesn't have solution for this. She does not tolerate OTC cough medications, including mucinex.    Review of Systems  HENT:  Positive for congestion and ear pain. Negative for sore throat.   Respiratory:  Positive for cough (productive- green) and wheezing.   Cardiovascular:  Negative for chest pain.  Gastrointestinal:  Positive for nausea. Negative for abdominal pain, diarrhea and vomiting.  Neurological:  Negative for headaches.        Objective:    BP 100/63   Pulse (!) 131   Temp 98.6 F (37 C)   Ht '5\' 2"'$  (1.575 m)   Wt 176 lb (79.8 kg)   SpO2 95%   BMI 32.19 kg/m    Physical Exam Vitals and nursing note reviewed.  Constitutional:      General: She is not in acute distress.    Appearance: She is ill-appearing. She is not toxic-appearing or diaphoretic.  HENT:     Right Ear: Tympanic membrane, ear canal and external ear normal.     Left Ear: Tympanic membrane, ear canal and external ear normal.     Nose: Congestion present.     Mouth/Throat:     Mouth: Mucous membranes are moist.     Pharynx: Oropharynx is clear. No oropharyngeal exudate  or posterior oropharyngeal erythema.  Eyes:     General: No scleral icterus.       Right eye: No discharge.        Left eye: No discharge.     Conjunctiva/sclera: Conjunctivae normal.  Cardiovascular:     Rate and Rhythm: Normal rate and regular rhythm.     Heart sounds: Normal heart sounds. No murmur heard. Pulmonary:     Effort: Pulmonary effort is normal. No respiratory distress.     Breath sounds: Normal breath sounds. No wheezing, rhonchi or rales.  Chest:     Chest wall: No tenderness.  Abdominal:     General: Bowel sounds are normal. There is no distension.     Palpations: Abdomen is soft.     Tenderness: There is no abdominal tenderness. There is no guarding or rebound.  Musculoskeletal:     Cervical back: No rigidity.     Right lower leg: No edema.     Left lower leg: No edema.  Lymphadenopathy:     Cervical: No cervical adenopathy.  Skin:    General: Skin is warm and dry.  Neurological:     General: No focal deficit present.     Mental Status: She is alert and oriented to person, place, and time.  Psychiatric:        Mood  and Affect: Mood normal.        Behavior: Behavior normal.        Thought Content: Thought content normal.        Judgment: Judgment normal.     No results found for any visits on 01/09/22.      Assessment & Plan:   [name] was seen today for uri, nasal congestion, cough and fever.  Diagnoses and all orders for this visit:  URI, acute Lungs clear on exam today. Covid/flu/RSV pending, although she is out of the treatment window for flu or Covid. Will treat with abx given duration of symptoms. She has numerous allergies, so zpak was sent in. Try xyzal as below. Can use albuterol prn for wheezing. Strict return precautions given.  -     COVID-19, Flu A+B and RSV -     albuterol (PROVENTIL) (2.5 MG/3ML) 0.083% nebulizer solution; Take 3 mLs (2.5 mg total) by nebulization every 6 (six) hours as needed for wheezing or shortness of breath. -      levocetirizine (XYZAL) 5 MG tablet; Take 1 tablet (5 mg total) by mouth every evening. -     azithromycin (ZITHROMAX Z-PAK) 250 MG tablet; As directed  Wheezing -     albuterol (PROVENTIL) (2.5 MG/3ML) 0.083% nebulizer solution; Take 3 mLs (2.5 mg total) by nebulization every 6 (six) hours as needed for wheezing or shortness of breath.  Return if symptoms worsen or fail to improve.  The patient indicates understanding of these issues and agrees with the plan.   Gwenlyn Perking, FNP

## 2022-01-10 LAB — COVID-19, FLU A+B AND RSV
Influenza A, NAA: NOT DETECTED
Influenza B, NAA: NOT DETECTED
RSV, NAA: NOT DETECTED
SARS-CoV-2, NAA: NOT DETECTED

## 2022-01-15 ENCOUNTER — Ambulatory Visit (INDEPENDENT_AMBULATORY_CARE_PROVIDER_SITE_OTHER): Payer: Medicare Other

## 2022-01-15 DIAGNOSIS — I428 Other cardiomyopathies: Secondary | ICD-10-CM

## 2022-01-15 DIAGNOSIS — Z95810 Presence of automatic (implantable) cardiac defibrillator: Secondary | ICD-10-CM

## 2022-01-16 LAB — CUP PACEART REMOTE DEVICE CHECK
Battery Remaining Longevity: 20 mo
Battery Remaining Percentage: 28 %
Battery Voltage: 2.84 V
Brady Statistic AP VP Percent: 85 %
Brady Statistic AP VS Percent: 3.2 %
Brady Statistic AS VP Percent: 6.9 %
Brady Statistic AS VS Percent: 1 %
Brady Statistic RA Percent Paced: 83 %
Date Time Interrogation Session: 20231226020019
HighPow Impedance: 75 Ohm
HighPow Impedance: 75 Ohm
Implantable Lead Connection Status: 753985
Implantable Lead Connection Status: 753985
Implantable Lead Connection Status: 753985
Implantable Lead Implant Date: 20110912
Implantable Lead Implant Date: 20110912
Implantable Lead Implant Date: 20120710
Implantable Lead Location: 753858
Implantable Lead Location: 753859
Implantable Lead Location: 753860
Implantable Lead Model: 7122
Implantable Pulse Generator Implant Date: 20180621
Lead Channel Impedance Value: 330 Ohm
Lead Channel Impedance Value: 480 Ohm
Lead Channel Impedance Value: 540 Ohm
Lead Channel Pacing Threshold Amplitude: 0.75 V
Lead Channel Pacing Threshold Amplitude: 0.75 V
Lead Channel Pacing Threshold Amplitude: 0.875 V
Lead Channel Pacing Threshold Pulse Width: 0.5 ms
Lead Channel Pacing Threshold Pulse Width: 0.5 ms
Lead Channel Pacing Threshold Pulse Width: 0.5 ms
Lead Channel Sensing Intrinsic Amplitude: 11.8 mV
Lead Channel Sensing Intrinsic Amplitude: 2 mV
Lead Channel Setting Pacing Amplitude: 2 V
Lead Channel Setting Pacing Amplitude: 2 V
Lead Channel Setting Pacing Amplitude: 2 V
Lead Channel Setting Pacing Pulse Width: 0.5 ms
Lead Channel Setting Pacing Pulse Width: 0.5 ms
Lead Channel Setting Sensing Sensitivity: 0.5 mV
Pulse Gen Serial Number: 9761374
Zone Setting Status: 755011

## 2022-01-23 ENCOUNTER — Encounter: Payer: Self-pay | Admitting: Family Medicine

## 2022-01-23 ENCOUNTER — Ambulatory Visit (INDEPENDENT_AMBULATORY_CARE_PROVIDER_SITE_OTHER): Payer: Medicare Other

## 2022-01-23 ENCOUNTER — Ambulatory Visit (INDEPENDENT_AMBULATORY_CARE_PROVIDER_SITE_OTHER): Payer: Medicare Other | Admitting: Family Medicine

## 2022-01-23 VITALS — BP 99/56 | HR 91 | Temp 98.5°F | Ht 62.0 in | Wt 174.4 lb

## 2022-01-23 DIAGNOSIS — R059 Cough, unspecified: Secondary | ICD-10-CM | POA: Diagnosis not present

## 2022-01-23 DIAGNOSIS — J189 Pneumonia, unspecified organism: Secondary | ICD-10-CM | POA: Diagnosis not present

## 2022-01-23 DIAGNOSIS — L03213 Periorbital cellulitis: Secondary | ICD-10-CM | POA: Diagnosis not present

## 2022-01-23 DIAGNOSIS — R0602 Shortness of breath: Secondary | ICD-10-CM | POA: Diagnosis not present

## 2022-01-23 MED ORDER — CLARITHROMYCIN 500 MG PO TABS: 500.0000 mg | ORAL_TABLET | Freq: Two times a day (BID) | ORAL | 0 refills | Status: AC

## 2022-01-23 MED ORDER — CEFPODOXIME PROXETIL 200 MG PO TABS: 400.0000 mg | ORAL_TABLET | Freq: Two times a day (BID) | ORAL | 0 refills | Status: AC

## 2022-01-23 MED ORDER — PREDNISONE 20 MG PO TABS: 40.0000 mg | ORAL_TABLET | Freq: Every day | ORAL | 0 refills | Status: AC

## 2022-01-23 NOTE — Progress Notes (Signed)
Acute Office Visit  Subjective:     Patient ID: Melissa Paul, female    DOB: Sep 04, 1945, 77 y.o.   MRN: [id number]  Chief Complaint  Patient presents with   Cough    HPI Patient is in today for cough x 2.5 weeks. Symptoms have been worsening. Cough is now productive with gray sputum. She also has felt short of breath for the last few days. Shortness of breath occurs with coughing and with activity. She has pain in her ribs with cough.  Her family told her that it sounded like she was wheezing yesterday as well. She reports a temperature of 100.9 last night and nasal congestion. She was evaluated on 01/09/22 and prescribed a zpak and xyzal. This has not helped with symptoms. She also has tried an albuterol inhaler without improvement.   She also has redness around her right eye for the last few days. This has been worsening. It is sore to the touch right under her eye. There has been clear drainage. Deneis pain with movement of her eye or pain of her eye. Her vision is also blurry in this eye.   ROS Negative unless specially indicated above in HPI.     Objective:    BP (!) 99/56   Pulse 91   Temp 98.5 F (36.9 C) (Temporal)   Ht '5\' 2"'$  (1.575 m)   Wt 174 lb 6 oz (79.1 kg)   SpO2 98%   BMI 31.89 kg/m    Physical Exam Vitals and nursing note reviewed.  Constitutional:      General: She is not in acute distress.    Appearance: She is not ill-appearing, toxic-appearing or diaphoretic.  HENT:     Head: Normocephalic and atraumatic.     Comments: Erythema surrounding right eye that extends to right middle cheek with mild swelling. Tenderness to right lower lid and upper cheek.     Nose: Congestion present.     Mouth/Throat:     Mouth: Mucous membranes are moist.     Pharynx: Oropharynx is clear.  Eyes:     General: Lids are normal. Gaze aligned appropriately.        Right eye: Discharge (clear) present.        Left eye: No discharge.     Extraocular Movements:  Extraocular movements intact.     Right eye: Normal extraocular motion.     Left eye: Normal extraocular motion.     Conjunctiva/sclera: Conjunctivae normal.     Pupils: Pupils are equal, round, and reactive to light.  Cardiovascular:     Rate and Rhythm: Normal rate and regular rhythm.     Heart sounds: Normal heart sounds. No murmur heard. Pulmonary:     Breath sounds: Rhonchi present. No decreased breath sounds, wheezing or rales.     Comments: Increased work of breathing with walking, unlabored at rest Musculoskeletal:     Cervical back: Neck supple. No rigidity.  Skin:    General: Skin is warm and dry.  Neurological:     Mental Status: She is alert and oriented to person, place, and time. Mental status is at baseline.     Gait: Gait abnormal (rolling walker).  Psychiatric:        Mood and Affect: Mood normal.        Behavior: Behavior normal.     No results found for any visits on 01/23/22.      Assessment & Plan:   Melissa Paul was seen today for cough.  Diagnoses and all orders for this visit:  Periorbital cellulitis of right eye Vs preseptal cellulitis. Discussed concerns for possible orbital cellulitis and need for imaging to assess for this. Unfortunately it is the end business day, so I am unable to get a CT scheduled for today. Recommended ER evaluation for this reason. Discussed risks of orbital cellulitis: vision loss, sepsis, etc. She does not have transportation to the ER currently. Offered EMS for transport to ER. She declined. Will treat with vantin as below as she has numerous abx allergies, but has tolerated ceftin in the past. STAT CT also ordered for tomorrow. Discussed ER evaluation for new or worsening symptoms.  -     cefpodoxime (VANTIN) 200 MG tablet; Take 2 tablets (400 mg total) by mouth 2 (two) times daily for 7 days. -     CT ORBITS WO CONTRAST; Future  Community acquired pneumonia of right upper lobe of lung CXR concerning for right upper lobe PNA.  She has numerous allergies including levofloxacin, keflex, PCNs, and doxycycline. She has tolerated both ceftin and omnicef in the past however. Will treat with vantin and clarithromycin as vantin will also cover for periorbital cellulitis. Prednisone burst as below. Discussed follow up in 4 weeks for repeat imagine, sooner for new or worsening symptoms. Discussed when to seek emergency care.  -     DG Chest 2 View; Future -     cefpodoxime (VANTIN) 200 MG tablet; Take 2 tablets (400 mg total) by mouth 2 (two) times daily for 7 days. -     clarithromycin (BIAXIN) 500 MG tablet; Take 1 tablet (500 mg total) by mouth 2 (two) times daily for 7 days. -     predniSONE (DELTASONE) 20 MG tablet; Take 2 tablets (40 mg total) by mouth daily with breakfast for 5 days.   The patient indicates understanding of these issues and agrees with the plan.  Gwenlyn Perking, FNP

## 2022-01-23 NOTE — Patient Instructions (Signed)
Preseptal Cellulitis, Adult Preseptal cellulitis is an infection of the eyelid and the tissues around the eye (periorbital area). The infection causes painful swelling and redness. This condition may also be called periorbital cellulitis. In most cases, the condition can be treated with antibiotic medicine at home. It is important to treat preseptal cellulitis right away so that it does not get worse. If it gets worse, it can spread to the eye socket and eye muscles (orbital cellulitis). Orbital cellulitis is a medical emergency. What are the causes? Preseptal cellulitis is most commonly caused by bacteria. In rare cases, it can be caused by a virus or fungus. The germs that cause preseptal cellulitis may come from: A sinus infection that spreads near the eyes. An injury near the eye, such as a scratch, puncture wound, animal bite, or insect bite. A skin rash, such as eczema or poison ivy, that becomes infected. An infected pimple on the eyelid (stye). Infection after eyelid surgery or injury. What increases the risk? You are more likely to develop this condition if: You have a weakened disease-fighting system (immune system). You have a medical condition that raises your risk for sinus infections, such as nasal polyps. What are the signs or symptoms? Symptoms of this condition include: Eyelids that are red and swollen and feel unusually hot. Fever. Difficulty opening the eye. Headache. Pain in the face. Symptoms of this condition usually develop suddenly. How is this diagnosed? This condition may be diagnosed based on your symptoms, your medical history, and an eye exam. You may also have tests, such as: Blood tests. Tests (cultures) to find out which specific bacteria are causing the infection. You may have a culture of any open wound or drainage. CT scan. MRI. This is less common. How is this treated? This condition is treated with antibiotic medicines. These may be given by mouth  (orally), through an IV, or as an injection. In rare cases, you may need surgery to drain an infected area. Follow these instructions at home: Medicines Take your antibiotic medicine as told by your health care provider. Do not stop taking the antibiotic even if you start to feel better Take over-the-counter and prescription medicines only as told by your health care provider. Eye Care Do not use eye drops without first getting approval from your health care provider. Do not touch or rub your eye. If you wear contact lenses, do not wear them until your health care provider approves. Keep the eye area clean and dry. Wash the eye area with a clean washcloth, warm water, and baby shampoo or mild soap. To help relieve discomfort, place a clean washcloth that is wet with warm water over your eye. Leave the washcloth on for a few minutes, then remove it. General instructions Wash your hands with soap and water often for at least 20 seconds. If soap and water are not available, use hand sanitizer. Do not use any products that contain nicotine or tobacco, such as cigarettes, e-cigarettes, and chewing tobacco. If you need help quitting, ask your health care provider. Drink enough fluid to keep your urine pale yellow. Do not drive or operate machinery until your health care provider says that it is safe. Ask your health care provider if it is safe for you to drive. Stay up to date on your vaccinations. Keep all follow-up visits. This includes any visits with an eye specialist (ophthalmologist) or dentist. This is important. Get help right away if: You have new symptoms. Your symptoms get worse or  do not get better with treatment. You have a fever. Your vision becomes blurry or gets worse in any way. Your eye looks like it is sticking out or bulging out (proptosis). You develop double vision. You have trouble moving your eyes or pain when moving your eyes You have a severe headache. You have neck  stiffness or severe neck pain. These symptoms may represent a serious problem that is an emergency. Do not wait to see if the symptoms will go away. Get medical help right away. Call your local emergency services (911 in the [place] Do not drive yourself to the hospital. Summary Preseptal cellulitis is an infection of the eyelid and the tissues around the eye. Symptoms of preseptal cellulitis usually develop suddenly and include red and swollen eyelids, fever, difficulty opening the eye, headache, and facial pain. This condition is treated with antibiotic medicines. Do not stop taking the antibiotic even if you start to feel better. Preseptal cellulitis can develop into orbital cellulitis, which is a medical emergency. If your condition does not improve or worsens, visit your heath care provider right away. This information is not intended to replace advice given to you by your health care provider. Make sure you discuss any questions you have with your health care provider. Document Revised: 05/12/2019 Document Reviewed: 05/12/2019 Elsevier Patient Education  [place].

## 2022-01-24 ENCOUNTER — Ambulatory Visit (HOSPITAL_COMMUNITY)
Admission: RE | Admit: 2022-01-24 | Discharge: 2022-01-24 | Disposition: A | Discharge status: Home / Self Care | Payer: Medicare Other | Source: Ambulatory Visit | Attending: Family Medicine | Admitting: Family Medicine

## 2022-01-24 DIAGNOSIS — L03213 Periorbital cellulitis: Secondary | ICD-10-CM | POA: Insufficient documentation

## 2022-01-25 LAB — CBC WITH DIFFERENTIAL/PLATELET
Basophils Absolute: 0 10*3/uL (ref 0.0–0.2)
Basos: 0 %
EOS (ABSOLUTE): 0 10*3/uL (ref 0.0–0.4)
Eos: 0 %
Hematocrit: 30.6 % — ABNORMAL LOW (ref 34.0–46.6)
Hemoglobin: 9.5 g/dL — ABNORMAL LOW (ref 11.1–15.9)
Immature Grans (Abs): 0.1 10*3/uL (ref 0.0–0.1)
Immature Granulocytes: 1 %
Lymphocytes Absolute: 0.8 10*3/uL (ref 0.7–3.1)
Lymphs: 12 %
MCH: 23.8 pg — ABNORMAL LOW (ref 26.6–33.0)
MCHC: 31 g/dL — ABNORMAL LOW (ref 31.5–35.7)
MCV: 77 fL — ABNORMAL LOW (ref 79–97)
Monocytes Absolute: 0.2 10*3/uL (ref 0.1–0.9)
Monocytes: 4 %
Neutrophils Absolute: 5.2 10*3/uL (ref 1.4–7.0)
Neutrophils: 83 %
Platelets: 284 10*3/uL (ref 150–450)
RBC: 4 x10E6/uL (ref 3.77–5.28)
RDW: 16.1 % — ABNORMAL HIGH (ref 11.7–15.4)
WBC: 6.3 10*3/uL (ref 3.4–10.8)

## 2022-01-25 LAB — IRON,TIBC AND FERRITIN PANEL
Ferritin: 44 ng/mL (ref 15–150)
Iron Saturation: 7 % — CL (ref 15–55)
Iron: 22 ug/dL — ABNORMAL LOW (ref 27–139)
Total Iron Binding Capacity: 328 ug/dL (ref 250–450)
UIBC: 306 ug/dL (ref 118–369)

## 2022-01-25 LAB — B12 AND FOLATE PANEL
Folate: 19.8 ng/mL (ref >3.0)
Vitamin B-12: 372 pg/mL (ref 232–1245)

## 2022-01-28 ENCOUNTER — Other Ambulatory Visit: Payer: Self-pay

## 2022-01-28 DIAGNOSIS — D509 Iron deficiency anemia, unspecified: Secondary | ICD-10-CM

## 2022-01-28 NOTE — Telephone Encounter (Signed)
Advanced Heart Failure Patient Multimedia programmer for update. Application was referred to assistance program on 1/5. Will call assistance program [phone]) for update on or after 1/9.

## 2022-01-30 NOTE — Telephone Encounter (Signed)
Advanced Heart Failure Patient Advocate Encounter  Update from [employer]: application has not been processed under the assistance program as of yet. Case manager is being assigned to this application and will follow up within 1-2 days.

## 2022-02-04 ENCOUNTER — Ambulatory Visit (INDEPENDENT_AMBULATORY_CARE_PROVIDER_SITE_OTHER): Payer: Medicare Other

## 2022-02-04 DIAGNOSIS — I5022 Chronic systolic (congestive) heart failure: Secondary | ICD-10-CM | POA: Diagnosis not present

## 2022-02-04 DIAGNOSIS — Z95810 Presence of automatic (implantable) cardiac defibrillator: Secondary | ICD-10-CM | POA: Diagnosis not present

## 2022-02-05 NOTE — Telephone Encounter (Signed)
Advanced Heart Failure Patient Advocate Encounter  [employer] sent rx request form. Completed and faxed on 02/04/22

## 2022-02-06 NOTE — Telephone Encounter (Signed)
Advanced Heart Failure Patient Advocate Encounter  Patient was approved to receive Verquvo from [employer] Effective 02/05/22 to 01/21/23

## 2022-02-07 NOTE — Progress Notes (Signed)
EPIC Encounter for ICM Monitoring  Patient Name: Melissa Paul is a 77 y.o. female Date: 02/07/2022 Primary Care Physican: Sharion Balloon, FNP Primary Cardiologist: [facility] Electrophysiologist: Mealor Bi-V Pacing: 92%        09/06/2021 Weight: 179-180 lbs  8/30//2023 Office Weight: 178 lbs 11/12/2021 Weight: 178 lbs 11/20/2021 Weight: 173 lbs 12/25/2021 Weight: 178 lbs 01/01/2022 Weight: 178 lbs   AT/AF Burden: 1.3% (taking Eliquis)         Spoke with patient and heart failure questions reviewed.  Transmission results reviewed.  Pt asymptomatic for fluid accumulation.  Reports feeling well at this time and voices no complaints.       CorVue Thoracic impedance suggesting normal fluid levels with exception of 1/8-1/12.    Prescribed:  Furosemide 80 mg Take 80 mg by mouth daily as needed.  Pt takes extra if needed. Potassium 20 mEq take 1 tablet by mouth as directed.  Only take 1 tablet with extra doses of Lasix. Spirolactone 25 mg take 1 tablet by mouth daily Farxiga 5 mg take 1 tablet daily   Labs: 12/04/2021 Creatinine 1.19, BUN 19, Potassium 4.0, Sodium 138, GFR 47 10/12/2021 Creatinine 1.17, BUN 16, Potassium 4.8, Sodium 139, GFR 48 03/29/2021 Creatinine 1.32, BUN 15, Potassium 4.2, Sodium 139, GFR 42 03/09/2021 Creatinine 1.50, BUN 22, Potassium 4.0, Sodium 139, GFR 36 A complete set of results can be found in Results Review.   Recommendations:  No changes and encouraged to call if experiencing any fluid symptoms.   Follow-up plan: ICM clinic phone appointment on 03/12/2022.   91 day device clinic remote transmission 04/15/2022.    EP/Cardiology Office Visits:   Recall 04/03/2022.  05/03/2022 with Dr. Domenic Polite.   Recall 08/24/2022 with Dr Myles Gip.   Copy of ICM check sent to Dr. Myles Gip.   3 month ICM trend: 02/04/2022.    12-14 Month ICM trend:     [name], RN 02/07/2022 12:39 PM

## 2022-02-11 ENCOUNTER — Other Ambulatory Visit (HOSPITAL_COMMUNITY): Payer: Self-pay

## 2022-02-11 ENCOUNTER — Telehealth (HOSPITAL_COMMUNITY): Payer: Self-pay

## 2022-02-11 NOTE — Telephone Encounter (Signed)
I spoke with patient and informed her of status. I set aside samples for her to be picked up as well.  Medication Samples have been provided to the patient.  Drug name: Eliquis       Strength: '5mg'$         Qty: 3  LOT: [id number]  Exp.Date: 9/25  Dosing instructions: Take 1 tablet twice daily  The patient has been instructed regarding the correct time, dose, and frequency of taking this medication, including desired effects and most common side effects.   Melissa Paul 3:45 PM 02/11/2022

## 2022-02-11 NOTE — Telephone Encounter (Signed)
Patient called and said Eliquis was denied for RF. Is this because her application is being processed to continue assistance?

## 2022-02-11 NOTE — Progress Notes (Signed)
Remote ICD transmission.   

## 2022-02-11 NOTE — Telephone Encounter (Signed)
Advanced Heart Failure Patient Advocate Encounter  Called [employer] to check the status of the patient's application. Representative stated that all the required documentation has been provided. She is not sure why a decision hasn't been made yet. Clarified that the patient does not have part D coverage. She should not have the OOP requirement due to this. Application sent back to the processing team.  Will follow up.

## 2022-02-13 ENCOUNTER — Telehealth: Payer: Self-pay | Admitting: *Deleted

## 2022-02-13 NOTE — Telephone Encounter (Signed)
noted 

## 2022-02-13 NOTE — Patient Instructions (Signed)
Melissa Paul  02/13/2022     '@PREFPERIOPPHARMACY'$ @   Your procedure is scheduled on  02/19/2022.   Report to [facility] at  0730  A.M.   Call this number if you have problems the morning of surgery:  [phone]  If you experience any cold or flu symptoms such as cough, fever, chills, shortness of breath, etc. between now and your scheduled surgery, please notify us at the above number.   Remember:  Follow the diet and prep instructions given to you by the office.      Your last dose of farxiga should be on 02/15/2022.        Your last dose of eliquis should be on 02/16/2022.        Use your nebulizer before you come and bring your rescue inhaler if you have one.     Take these medicines the morning of surgery with A SIP OF WATER           carvedilol, hydroxyzine, levothyroxine, pantoprazole, vericiguat.     Do not wear jewelry, make-up or nail polish.  Do not wear lotions, powders, or perfumes, or deodorant.  Do not shave 48 hours prior to surgery.  Men may shave face and neck.  Do not bring valuables to the hospital.  [facility] is not responsible for any belongings or valuables.  Contacts, dentures or bridgework may not be worn into surgery.  Leave your suitcase in the car.  After surgery it may be brought to your room.  For patients admitted to the hospital, discharge time will be determined by your treatment team.  Patients discharged the day of surgery will not be allowed to drive home and must have someone with them for 24 hours.    Special instructions:   DO NOT smoke tobacco or vape for 24 hours before your procedure.  Please read over the following fact sheets that you were given. Anesthesia Post-op Instructions and Care and Recovery After Surgery      Upper Endoscopy, Adult, Care After After the procedure, it is common to have a sore throat. It is also common to have: Mild stomach pain or discomfort. Bloating. Nausea. Follow these  instructions at home: The instructions below may help you care for yourself at home. Your health care provider may give you more instructions. If you have questions, ask your health care provider. If you were given a sedative during the procedure, it can affect you for several hours. Do not drive or operate machinery until your health care provider says that it is safe. If you will be going home right after the procedure, plan to have a responsible adult: Take you home from the hospital or clinic. You will not be allowed to drive. Care for you for the time you are told. Follow instructions from your health care provider about what you may eat and drink. Return to your normal activities as told by your health care provider. Ask your health care provider what activities are safe for you. Take over-the-counter and prescription medicines only as told by your health care provider. Contact a health care provider if you: Have a sore throat that lasts longer than one day. Have trouble swallowing. Have a fever. Get help right away if you: Vomit blood or your vomit looks like coffee grounds. Have bloody, black, or tarry stools. Have a very bad sore throat or you cannot swallow. Have difficulty breathing or very bad pain in your  chest or abdomen. These symptoms may be an emergency. Get help right away. Call 911. Do not wait to see if the symptoms will go away. Do not drive yourself to the hospital. Summary After the procedure, it is common to have a sore throat, mild stomach discomfort, bloating, and nausea. If you were given a sedative during the procedure, it can affect you for several hours. Do not drive until your health care provider says that it is safe. Follow instructions from your health care provider about what you may eat and drink. Return to your normal activities as told by your health care provider. This information is not intended to replace advice given to you by your health care  provider. Make sure you discuss any questions you have with your health care provider. Document Revised: 04/18/2021 Document Reviewed: 04/18/2021 Elsevier Patient Education  [place]. Colonoscopy, Adult, Care After The following information offers guidance on how to care for yourself after your procedure. Your health care provider may also give you more specific instructions. If you have problems or questions, contact your health care provider. What can I expect after the procedure? After the procedure, it is common to have: A small amount of blood in your stool for 24 hours after the procedure. Some gas. Mild cramping or bloating of your abdomen. Follow these instructions at home: Eating and drinking  Drink enough fluid to keep your urine pale yellow. Follow instructions from your health care provider about eating or drinking restrictions. Resume your normal diet as told by your health care provider. Avoid heavy or fried foods that are hard to digest. Activity Rest as told by your health care provider. Avoid sitting for a long time without moving. Get up to take short walks every 1-2 hours. This is important to improve blood flow and breathing. Ask for help if you feel weak or unsteady. Return to your normal activities as told by your health care provider. Ask your health care provider what activities are safe for you. Managing cramping and bloating  Try walking around when you have cramps or feel bloated. If directed, apply heat to your abdomen as told by your health care provider. Use the heat source that your health care provider recommends, such as a moist heat pack or a heating pad. Place a towel between your skin and the heat source. Leave the heat on for 20-30 minutes. Remove the heat if your skin turns bright red. This is especially important if you are unable to feel pain, heat, or cold. You have a greater risk of getting burned. General instructions If you were given  a sedative during the procedure, it can affect you for several hours. Do not drive or operate machinery until your health care provider says that it is safe. For the first 24 hours after the procedure: Do not sign important documents. Do not drink alcohol. Do your regular daily activities at a slower pace than normal. Eat soft foods that are easy to digest. Take over-the-counter and prescription medicines only as told by your health care provider. Keep all follow-up visits. This is important. Contact a health care provider if: You have blood in your stool 2-3 days after the procedure. Get help right away if: You have more than a small spotting of blood in your stool. You have large blood clots in your stool. You have swelling of your abdomen. You have nausea or vomiting. You have a fever. You have increasing pain in your abdomen that is not  relieved with medicine. These symptoms may be an emergency. Get help right away. Call 911. Do not wait to see if the symptoms will go away. Do not drive yourself to the hospital. Summary After the procedure, it is common to have a small amount of blood in your stool. You may also have mild cramping and bloating of your abdomen. If you were given a sedative during the procedure, it can affect you for several hours. Do not drive or operate machinery until your health care provider says that it is safe. Get help right away if you have a lot of blood in your stool, nausea or vomiting, a fever, or increased pain in your abdomen. This information is not intended to replace advice given to you by your health care provider. Make sure you discuss any questions you have with your health care provider. Document Revised: 08/30/2020 Document Reviewed: 08/30/2020 Elsevier Patient Education  [place] After The following information offers guidance on how to care for yourself after your procedure. Your health care provider  may also give you more specific instructions. If you have problems or questions, contact your health care provider. What can I expect after the procedure? After the procedure, it is common to have: Tiredness. Little or no memory about what happened during or after the procedure. Impaired judgment when it comes to making decisions. Nausea or vomiting. Some trouble with balance. Follow these instructions at home: For the time period you were told by your health care provider:  Rest. Do not participate in activities where you could fall or become injured. Do not drive or use machinery. Do not drink alcohol. Do not take sleeping pills or medicines that cause drowsiness. Do not make important decisions or sign legal documents. Do not take care of children on your own. Medicines Take over-the-counter and prescription medicines only as told by your health care provider. If you were prescribed antibiotics, take them as told by your health care provider. Do not stop using the antibiotic even if you start to feel better. Eating and drinking Follow instructions from your health care provider about what you may eat and drink. Drink enough fluid to keep your urine pale yellow. If you vomit: Drink clear fluids slowly and in small amounts as you are able. Clear fluids include water, ice chips, low-calorie sports drinks, and fruit juice that has water added to it (diluted fruit juice). Eat light and bland foods in small amounts as you are able. These foods include bananas, applesauce, rice, lean meats, toast, and crackers. General instructions  Have a responsible adult stay with you for the time you are told. It is important to have someone help care for you until you are awake and alert. If you have sleep apnea, surgery and some medicines can increase your risk for breathing problems. Follow instructions from your health care provider about wearing your sleep device: When you are sleeping. This  includes during daytime naps. While taking prescription pain medicines, sleeping medicines, or medicines that make you drowsy. Do not use any products that contain nicotine or tobacco. These products include cigarettes, chewing tobacco, and vaping devices, such as e-cigarettes. If you need help quitting, ask your health care provider. Contact a health care provider if: You feel nauseous or vomit every time you eat or drink. You feel light-headed. You are still sleepy or having trouble with balance after 24 hours. You get a rash. You have a fever. You have redness or swelling  around the IV site. Get help right away if: You have trouble breathing. You have new confusion after you get home. These symptoms may be an emergency. Get help right away. Call 911. Do not wait to see if the symptoms will go away. Do not drive yourself to the hospital. This information is not intended to replace advice given to you by your health care provider. Make sure you discuss any questions you have with your health care provider. Document Revised: 06/04/2021 Document Reviewed: 06/04/2021 Elsevier Patient Education  [place].

## 2022-02-13 NOTE — Telephone Encounter (Signed)
-----  Message from Josue Hector sent at 02/13/2022  3:32 PM EST ----- This pt LM that she is sick and needs to reschedule.  I have canceled her PAT and put her in the depot.  Let me know if I should reschedule her.  Thanks,

## 2022-02-13 NOTE — Telephone Encounter (Signed)
Spoke with pt and she says she will call back to reschedule once she is feeling better.  FYI Dr.Castaneda

## 2022-02-13 NOTE — Telephone Encounter (Signed)
Thanks

## 2022-02-15 ENCOUNTER — Encounter (HOSPITAL_COMMUNITY): Payer: Self-pay

## 2022-02-15 ENCOUNTER — Encounter (HOSPITAL_COMMUNITY)
Admission: RE | Admit: 2022-02-15 | Discharge: 2022-02-15 | Disposition: A | Discharge status: Home / Self Care | Payer: Medicare Other | Source: Ambulatory Visit | Attending: Gastroenterology | Admitting: Gastroenterology

## 2022-02-15 DIAGNOSIS — D509 Iron deficiency anemia, unspecified: Secondary | ICD-10-CM

## 2022-02-15 DIAGNOSIS — E1165 Type 2 diabetes mellitus with hyperglycemia: Secondary | ICD-10-CM

## 2022-02-19 ENCOUNTER — Ambulatory Visit (HOSPITAL_COMMUNITY): Admission: RE | Admit: 2022-02-19 | Payer: Medicare Other | Source: Home / Self Care | Admitting: Gastroenterology

## 2022-02-19 ENCOUNTER — Encounter (HOSPITAL_COMMUNITY): Admission: RE | Payer: Self-pay | Source: Home / Self Care

## 2022-02-19 SURGERY — COLONOSCOPY WITH PROPOFOL
Anesthesia: Monitor Anesthesia Care

## 2022-03-11 ENCOUNTER — Telehealth (HOSPITAL_COMMUNITY): Payer: Self-pay | Admitting: Licensed Clinical Social Worker

## 2022-03-11 ENCOUNTER — Other Ambulatory Visit (HOSPITAL_COMMUNITY): Payer: Self-pay

## 2022-03-11 NOTE — Telephone Encounter (Signed)
CSW called pt to inquire if she was still in need of samples of Eliquis left at front desk about a month ago.  She states she was able to find another bottle so has her medications but only has a week left and has not heard back regarding BMS enrollment status.  CSW sent message to pharmacy team to follow up.  Jorge Ny, LCSW Clinical Social Worker [facility] Desk#: [phone] Cell#: [phone]

## 2022-03-11 NOTE — Telephone Encounter (Signed)
Advanced [facility] Patient Advocate Encounter  Contacted [employer]MS for update - application has not yet been reprocessed. Representative stated he is escalating the case to be reviewed. Contacted patient with update, and informed her that samples are available to be picked up if needed.

## 2022-03-12 ENCOUNTER — Ambulatory Visit: Payer: Medicare Other

## 2022-03-12 DIAGNOSIS — Z95810 Presence of automatic (implantable) cardiac defibrillator: Secondary | ICD-10-CM | POA: Diagnosis not present

## 2022-03-12 DIAGNOSIS — I5022 Chronic systolic (congestive) heart failure: Secondary | ICD-10-CM

## 2022-03-15 NOTE — Progress Notes (Signed)
EPIC Encounter for ICM Monitoring  Patient Name: Melissa Paul is a 77 y.o. female Date: 03/15/2022 Primary Care Physican: Sharion Balloon, FNP Primary Cardiologist: [facility] Electrophysiologist: Mealor Bi-V Pacing: 91%        01/01/2022 Weight: 178 lbs 03/15/2022 Weight: 170 lbs   AT/AF Burden: 1.5% (taking Eliquis)         Spoke with patient and heart failure questions reviewed.  Transmission results reviewed.  Pt reports chronic SOB and lower extremity swelling but no worse than usual.        CorVue Thoracic impedance suggesting normal fluid levels with intermittent days with possible fluid accumulation within last month.    Prescribed:  Furosemide 80 mg Take 80 mg by mouth daily as needed.  Pt takes extra if needed. Potassium 20 mEq take 1 tablet by mouth as directed.  Only take 1 tablet with extra doses of Lasix. Spirolactone 25 mg take 1 tablet by mouth daily Farxiga 5 mg take 1 tablet daily   Labs: 12/04/2021 Creatinine 1.19, BUN 19, Potassium 4.0, Sodium 138, GFR 47 10/12/2021 Creatinine 1.17, BUN 16, Potassium 4.8, Sodium 139, GFR 48 03/29/2021 Creatinine 1.32, BUN 15, Potassium 4.2, Sodium 139, GFR 42 03/09/2021 Creatinine 1.50, BUN 22, Potassium 4.0, Sodium 139, GFR 36 A complete set of results can be found in Results Review.   Recommendations:  No changes and encouraged to call if experiencing any fluid symptoms.   Follow-up plan: ICM clinic phone appointment on 04/16/2022.   91 day device clinic remote transmission 04/15/2022.    EP/Cardiology Office Visits:    05/03/2022 with Dr. Domenic Polite.   Recall 08/24/2022 with Dr Myles Gip.   Copy of ICM check sent to Dr. Myles Gip.    3 month ICM trend: 03/12/2022.    12-14 Month ICM trend:     [name], RN 03/15/2022 3:46 PM

## 2022-03-24 ENCOUNTER — Other Ambulatory Visit: Payer: Self-pay | Admitting: Family

## 2022-04-04 ENCOUNTER — Ambulatory Visit (INDEPENDENT_AMBULATORY_CARE_PROVIDER_SITE_OTHER): Payer: Medicare Other | Admitting: Family

## 2022-04-04 ENCOUNTER — Encounter: Payer: Self-pay | Admitting: Family

## 2022-04-04 VITALS — BP 122/61 | HR 69 | Temp 95.7°F | Ht 62.0 in | Wt 178.2 lb

## 2022-04-04 DIAGNOSIS — R399 Unspecified symptoms and signs involving the genitourinary system: Secondary | ICD-10-CM

## 2022-04-04 LAB — URINALYSIS, COMPLETE
Bilirubin, UA: NEGATIVE
Leukocytes,UA: NEGATIVE
Nitrite, UA: POSITIVE — AB
Protein,UA: NEGATIVE
Specific Gravity, UA: 1.015 (ref 1.005–1.030)
Urobilinogen, Ur: 0.2 mg/dL (ref 0.2–1.0)
pH, UA: 6 (ref 5.0–7.5)

## 2022-04-04 LAB — MICROSCOPIC EXAMINATION
RBC, Urine: NONE SEEN /hpf (ref 0–2)
Renal Epithel, UA: NONE SEEN /hpf

## 2022-04-04 MED ORDER — CLARITHROMYCIN 500 MG PO TABS: 500.0000 mg | ORAL_TABLET | Freq: Two times a day (BID) | ORAL | 0 refills | Status: AC

## 2022-04-04 NOTE — Progress Notes (Signed)
Subjective:    Patient ID: Melissa Paul, female    DOB: Feb 23, 1945, 77 y.o.   MRN: [id number]  Chief Complaint  Patient presents with   Urinary Tract Infection    Patient states pain across her bladder   Back Pain    Urinary Tract Infection  Associated symptoms include nausea. Pertinent negatives include no frequency, hematuria or vomiting.  Back Pain Associated symptoms include abdominal pain. Pertinent negatives include no dysuria.  Abdominal Pain This is a new problem. The current episode started in the past 7 days. The onset quality is gradual. The problem occurs constantly. The pain is located in the suprapubic region. The pain is at a severity of 10/10. The pain is moderate. The quality of the pain is aching and burning. The abdominal pain radiates to the right flank. Associated symptoms include nausea. Pertinent negatives include no belching, constipation, diarrhea, dysuria, frequency, hematuria or vomiting. Relieved by: forcing fluids. She has tried nothing for the symptoms. The treatment provided no relief.      Review of Systems  Gastrointestinal:  Positive for abdominal pain and nausea. Negative for constipation, diarrhea and vomiting.  Genitourinary:  Negative for dysuria, frequency and hematuria.  Musculoskeletal:  Positive for back pain.  All other systems reviewed and are negative.      Objective:   Physical Exam Vitals reviewed.  Constitutional:      General: She is not in acute distress.    Appearance: She is well-developed.  HENT:     Head: Normocephalic and atraumatic.  Eyes:     Pupils: Pupils are equal, round, and reactive to light.  Neck:     Thyroid: No thyromegaly.  Cardiovascular:     Rate and Rhythm: Normal rate and regular rhythm.     Heart sounds: Normal heart sounds. No murmur heard. Pulmonary:     Effort: Pulmonary effort is normal. No respiratory distress.     Breath sounds: Normal breath sounds. No wheezing.  Abdominal:      General: Bowel sounds are normal. There is no distension.     Palpations: Abdomen is soft.     Tenderness: There is no abdominal tenderness (mild lower). There is right CVA tenderness.  Musculoskeletal:        General: No tenderness. Normal range of motion.     Cervical back: Normal range of motion and neck supple.  Skin:    General: Skin is warm and dry.  Neurological:     Mental Status: She is alert and oriented to person, place, and time.     Cranial Nerves: No cranial nerve deficit.     Deep Tendon Reflexes: Reflexes are normal and symmetric.  Psychiatric:        Behavior: Behavior normal.        Thought Content: Thought content normal.        Judgment: Judgment normal.     BP 122/61   Pulse 69   Temp (!) 95.7 F (35.4 C) (Temporal)   Ht '5\' 2"'$  (1.575 m)   Wt 178 lb 3.2 oz (80.8 kg)   SpO2 98%   BMI 32.59 kg/m        Assessment & Plan:  CORDELL GOLUB comes in today with chief complaint of Urinary Tract Infection (Patient states pain across her bladder) and Back Pain   Diagnosis and orders addressed:  1. UTI symptoms Force fluids AZO over the counter X2 days Culture pending  Follow up if symptoms worsen or do not improve -  Urinalysis, Complete - Urine Culture - clarithromycin (BIAXIN) 500 MG tablet; Take 1 tablet (500 mg total) by mouth 2 (two) times daily for 10 days.  Dispense: 20 tablet; Refill: 0   Evelina Dun, FNP

## 2022-04-04 NOTE — Patient Instructions (Signed)
Urinary Tract Infection, Adult  A urinary tract infection (UTI) is an infection of any part of the urinary tract. The urinary tract includes the kidneys, ureters, bladder, and urethra. These organs make, store, and get rid of urine in the body. An upper UTI affects the ureters and kidneys. A lower UTI affects the bladder and urethra. What are the causes? Most urinary tract infections are caused by bacteria in your genital area around your urethra, where urine leaves your body. These bacteria grow and cause inflammation of your urinary tract. What increases the risk? You are more likely to develop this condition if: You have a urinary catheter that stays in place. You are not able to control when you urinate or have a bowel movement (incontinence). You are female and you: Use a spermicide or diaphragm for birth control. Have low estrogen levels. Are pregnant. You have certain genes that increase your risk. You are sexually active. You take antibiotic medicines. You have a condition that causes your flow of urine to slow down, such as: An enlarged prostate, if you are female. Blockage in your urethra. A kidney stone. A nerve condition that affects your bladder control (neurogenic bladder). Not getting enough to drink, or not urinating often. You have certain medical conditions, such as: Diabetes. A weak disease-fighting system (immunesystem). Sickle cell disease. Gout. Spinal cord injury. What are the signs or symptoms? Symptoms of this condition include: Needing to urinate right away (urgency). Frequent urination. This may include small amounts of urine each time you urinate. Pain or burning with urination. Blood in the urine. Urine that smells bad or unusual. Trouble urinating. Cloudy urine. Vaginal discharge, if you are female. Pain in the abdomen or the lower back. You may also have: Vomiting or a decreased appetite. Confusion. Irritability or tiredness. A fever or  chills. Diarrhea. The first symptom in older adults may be confusion. In some cases, they may not have any symptoms until the infection has worsened. How is this diagnosed? This condition is diagnosed based on your medical history and a physical exam. You may also have other tests, including: Urine tests. Blood tests. Tests for STIs (sexually transmitted infections). If you have had more than one UTI, a cystoscopy or imaging studies may be done to determine the cause of the infections. How is this treated? Treatment for this condition includes: Antibiotic medicine. Over-the-counter medicines to treat discomfort. Drinking enough water to stay hydrated. If you have frequent infections or have other conditions such as a kidney stone, you may need to see a health care provider who specializes in the urinary tract (urologist). In rare cases, urinary tract infections can cause sepsis. Sepsis is a life-threatening condition that occurs when the body responds to an infection. Sepsis is treated in the hospital with IV antibiotics, fluids, and other medicines. Follow these instructions at home:  Medicines Take over-the-counter and prescription medicines only as told by your health care provider. If you were prescribed an antibiotic medicine, take it as told by your health care provider. Do not stop using the antibiotic even if you start to feel better. General instructions Make sure you: Empty your bladder often and completely. Do not hold urine for long periods of time. Empty your bladder after sex. Wipe from front to back after urinating or having a bowel movement if you are female. Use each tissue only one time when you wipe. Drink enough fluid to keep your urine pale yellow. Keep all follow-up visits. This is important. Contact a health   care provider if: Your symptoms do not get better after 1-2 days. Your symptoms go away and then return. Get help right away if: You have severe pain in  your back or your lower abdomen. You have a fever or chills. You have nausea or vomiting. Summary A urinary tract infection (UTI) is an infection of any part of the urinary tract, which includes the kidneys, ureters, bladder, and urethra. Most urinary tract infections are caused by bacteria in your genital area. Treatment for this condition often includes antibiotic medicines. If you were prescribed an antibiotic medicine, take it as told by your health care provider. Do not stop using the antibiotic even if you start to feel better. Keep all follow-up visits. This is important. This information is not intended to replace advice given to you by your health care provider. Make sure you discuss any questions you have with your health care provider. Document Revised: 08/20/2019 Document Reviewed: 08/20/2019 Elsevier Patient Education  2023 Elsevier Inc.  

## 2022-04-07 LAB — URINE CULTURE

## 2022-04-09 ENCOUNTER — Other Ambulatory Visit: Payer: Self-pay | Admitting: Family

## 2022-04-09 DIAGNOSIS — Z889 Allergy status to unspecified drugs, medicaments and biological substances status: Secondary | ICD-10-CM

## 2022-04-09 DIAGNOSIS — N390 Urinary tract infection, site not specified: Secondary | ICD-10-CM

## 2022-04-09 MED ORDER — FOSFOMYCIN TROMETHAMINE 3 G PO PACK: 3.0000 g | PACK | Freq: Once | ORAL | 0 refills | Status: AC

## 2022-04-15 ENCOUNTER — Ambulatory Visit (INDEPENDENT_AMBULATORY_CARE_PROVIDER_SITE_OTHER): Payer: Medicare Other

## 2022-04-15 DIAGNOSIS — I428 Other cardiomyopathies: Secondary | ICD-10-CM | POA: Diagnosis not present

## 2022-04-16 ENCOUNTER — Ambulatory Visit: Payer: Medicare Other | Attending: Cardiovascular Disease

## 2022-04-16 DIAGNOSIS — Z95810 Presence of automatic (implantable) cardiac defibrillator: Secondary | ICD-10-CM | POA: Diagnosis not present

## 2022-04-16 DIAGNOSIS — I5022 Chronic systolic (congestive) heart failure: Secondary | ICD-10-CM

## 2022-04-16 NOTE — Progress Notes (Signed)
EPIC Encounter for ICM Monitoring  Patient Name: Melissa Paul is a 77 y.o. female Date: 04/16/2022 Primary Care Physican: Sharion Balloon, FNP Primary Cardiologist: [facility] Electrophysiologist: Mealor Bi-V Pacing: 91%        01/01/2022 Weight: 178 lbs 03/15/2022 Weight: 170 lbs   AT/AF Burden: 1.7% (taking Eliquis)         Spoke with patient and heart failure questions reviewed.  Transmission results reviewed.  Pt reports leg and abdominal bloating during decreased impedance which resolved after taking extra Lasix.        CorVue Thoracic impedance suggesting normal fluid levels but suggesting possible fluid accumulation 3/4-3/10, 3/13-3/16 and 3/21-3/24.   Prescribed:  Furosemide 80 mg Take 80 mg by mouth daily as needed.  Pt takes extra if needed. Potassium 20 mEq take 1 tablet by mouth as directed.  Only take 1 tablet with extra doses of Lasix. Spirolactone 25 mg take 1 tablet by mouth daily Farxiga 5 mg take 1 tablet daily   Labs: 12/04/2021 Creatinine 1.19, BUN 19, Potassium 4.0, Sodium 138, GFR 47 10/12/2021 Creatinine 1.17, BUN 16, Potassium 4.8, Sodium 139, GFR 48 03/29/2021 Creatinine 1.32, BUN 15, Potassium 4.2, Sodium 139, GFR 42 03/09/2021 Creatinine 1.50, BUN 22, Potassium 4.0, Sodium 139, GFR 36 A complete set of results can be found in Results Review.   Recommendations:  No changes and encouraged to call if experiencing any fluid symptoms.   Follow-up plan: ICM clinic phone appointment on 05/20/2022.   91 day device clinic remote transmission 07/15/2022.    EP/Cardiology Office Visits:    05/03/2022 with Dr. Domenic Polite.   08/23/2022 with Dr Myles Gip.   Copy of ICM check sent to Dr. Myles Gip.     3 month ICM trend: 04/16/2022.    12-14 Month ICM trend:     [name], RN 04/16/2022 3:45 PM

## 2022-04-18 ENCOUNTER — Telehealth: Payer: Self-pay

## 2022-04-18 LAB — CUP PACEART REMOTE DEVICE CHECK
Battery Remaining Longevity: 19 mo
Battery Remaining Percentage: 25 %
Battery Voltage: 2.81 V
Brady Statistic AP VP Percent: 82 %
Brady Statistic AP VS Percent: 4 %
Brady Statistic AS VP Percent: 8.5 %
Brady Statistic AS VS Percent: 1.2 %
Brady Statistic RA Percent Paced: 80 %
Date Time Interrogation Session: 20240328040016
HighPow Impedance: 77 Ohm
HighPow Impedance: 77 Ohm
Implantable Lead Connection Status: 753985
Implantable Lead Connection Status: 753985
Implantable Lead Connection Status: 753985
Implantable Lead Implant Date: 20110912
Implantable Lead Implant Date: 20110912
Implantable Lead Implant Date: 20120710
Implantable Lead Location: 753858
Implantable Lead Location: 753859
Implantable Lead Location: 753860
Implantable Lead Model: 7122
Implantable Pulse Generator Implant Date: 20180621
Lead Channel Impedance Value: 330 Ohm
Lead Channel Impedance Value: 460 Ohm
Lead Channel Impedance Value: 530 Ohm
Lead Channel Pacing Threshold Amplitude: 0.75 V
Lead Channel Pacing Threshold Amplitude: 0.75 V
Lead Channel Pacing Threshold Amplitude: 0.75 V
Lead Channel Pacing Threshold Pulse Width: 0.5 ms
Lead Channel Pacing Threshold Pulse Width: 0.5 ms
Lead Channel Pacing Threshold Pulse Width: 0.5 ms
Lead Channel Sensing Intrinsic Amplitude: 12 mV
Lead Channel Sensing Intrinsic Amplitude: 3.2 mV
Lead Channel Setting Pacing Amplitude: 2 V
Lead Channel Setting Pacing Amplitude: 2 V
Lead Channel Setting Pacing Amplitude: 2 V
Lead Channel Setting Pacing Pulse Width: 0.5 ms
Lead Channel Setting Pacing Pulse Width: 0.5 ms
Lead Channel Setting Sensing Sensitivity: 0.5 mV
Pulse Gen Serial Number: 9761374
Zone Setting Status: 755011

## 2022-04-23 ENCOUNTER — Encounter: Payer: Self-pay | Admitting: Family

## 2022-04-23 ENCOUNTER — Encounter (HOSPITAL_COMMUNITY): Payer: Self-pay

## 2022-04-23 ENCOUNTER — Other Ambulatory Visit: Payer: Self-pay

## 2022-04-23 ENCOUNTER — Emergency Department (HOSPITAL_COMMUNITY): Payer: Medicare Other

## 2022-04-23 ENCOUNTER — Ambulatory Visit (INDEPENDENT_AMBULATORY_CARE_PROVIDER_SITE_OTHER): Payer: Medicare Other | Admitting: Family

## 2022-04-23 ENCOUNTER — Emergency Department (HOSPITAL_COMMUNITY)
Admission: EM | Admit: 2022-04-23 | Discharge: 2022-04-23 | Disposition: A | Discharge status: Home / Self Care | Payer: Medicare Other | Attending: Emergency Medicine | Admitting: Emergency Medicine

## 2022-04-23 VITALS — BP 93/55 | HR 74 | Temp 97.0°F | Ht 62.0 in | Wt 174.0 lb

## 2022-04-23 DIAGNOSIS — N390 Urinary tract infection, site not specified: Secondary | ICD-10-CM | POA: Diagnosis not present

## 2022-04-23 DIAGNOSIS — I509 Heart failure, unspecified: Secondary | ICD-10-CM | POA: Diagnosis not present

## 2022-04-23 DIAGNOSIS — Z7901 Long term (current) use of anticoagulants: Secondary | ICD-10-CM | POA: Insufficient documentation

## 2022-04-23 DIAGNOSIS — Z91040 Latex allergy status: Secondary | ICD-10-CM | POA: Insufficient documentation

## 2022-04-23 DIAGNOSIS — R300 Dysuria: Secondary | ICD-10-CM | POA: Diagnosis not present

## 2022-04-23 DIAGNOSIS — Z889 Allergy status to unspecified drugs, medicaments and biological substances status: Secondary | ICD-10-CM | POA: Diagnosis not present

## 2022-04-23 DIAGNOSIS — K5900 Constipation, unspecified: Secondary | ICD-10-CM | POA: Insufficient documentation

## 2022-04-23 DIAGNOSIS — I959 Hypotension, unspecified: Secondary | ICD-10-CM | POA: Diagnosis not present

## 2022-04-23 DIAGNOSIS — R339 Retention of urine, unspecified: Secondary | ICD-10-CM | POA: Diagnosis not present

## 2022-04-23 DIAGNOSIS — D649 Anemia, unspecified: Secondary | ICD-10-CM | POA: Diagnosis not present

## 2022-04-23 LAB — COMPREHENSIVE METABOLIC PANEL
ALT: 10 U/L (ref 0–44)
AST: 13 U/L — ABNORMAL LOW (ref 15–41)
Albumin: 4.4 g/dL (ref 3.5–5.0)
Alkaline Phosphatase: 83 U/L (ref 38–126)
Anion gap: 10 (ref 5–15)
BUN: 22 mg/dL (ref 8–23)
CO2: 27 mmol/L (ref 22–32)
Calcium: 8.8 mg/dL — ABNORMAL LOW (ref 8.9–10.3)
Chloride: 99 mmol/L (ref 98–111)
Creatinine, Ser: 1.18 mg/dL — ABNORMAL HIGH (ref 0.44–1.00)
GFR, Estimated: 47 mL/min — ABNORMAL LOW (ref >60)
Glucose, Bld: 109 mg/dL — ABNORMAL HIGH (ref 70–99)
Potassium: 3.4 mmol/L — ABNORMAL LOW (ref 3.5–5.1)
Sodium: 136 mmol/L (ref 135–145)
Total Bilirubin: 0.5 mg/dL (ref 0.3–1.2)
Total Protein: 7.4 g/dL (ref 6.5–8.1)

## 2022-04-23 LAB — CBC
HCT: 32.9 % — ABNORMAL LOW (ref 36.0–46.0)
Hemoglobin: 9.9 g/dL — ABNORMAL LOW (ref 12.0–15.0)
MCH: 24.1 pg — ABNORMAL LOW (ref 26.0–34.0)
MCHC: 30.1 g/dL (ref 30.0–36.0)
MCV: 80 fL (ref 80.0–100.0)
Platelets: 256 10*3/uL (ref 150–400)
RBC: 4.11 MIL/uL (ref 3.87–5.11)
RDW: 17.9 % — ABNORMAL HIGH (ref 11.5–15.5)
WBC: 6 10*3/uL (ref 4.0–10.5)
nRBC: 0 % (ref 0.0–0.2)

## 2022-04-23 LAB — URINALYSIS, ROUTINE W REFLEX MICROSCOPIC
Bilirubin Urine: NEGATIVE
Glucose, UA: >=500 mg/dL — AB
Hgb urine dipstick: NEGATIVE
Ketones, ur: NEGATIVE mg/dL
Nitrite: NEGATIVE
Protein, ur: NEGATIVE mg/dL
Specific Gravity, Urine: 1.013 (ref 1.005–1.030)
pH: 5 (ref 5.0–8.0)

## 2022-04-23 LAB — LIPASE, BLOOD: Lipase: 37 U/L (ref 11–51)

## 2022-04-23 LAB — WET PREP, GENITAL
Clue Cells Wet Prep HPF POC: NONE SEEN
Sperm: NONE SEEN
Trich, Wet Prep: NONE SEEN
WBC, Wet Prep HPF POC: >=10 — AB (ref <10)
Yeast Wet Prep HPF POC: NONE SEEN

## 2022-04-23 MED ORDER — CIPROFLOXACIN HCL 250 MG PO TABS: 250.0000 mg | ORAL_TABLET | Freq: Two times a day (BID) | ORAL | 0 refills | Status: DC

## 2022-04-23 MED ORDER — PHENAZOPYRIDINE HCL 100 MG PO TABS
200.0000 mg | ORAL_TABLET | Freq: Once | ORAL | Status: AC
  Administered 2022-04-23: 200 mg via ORAL
  Filled 2022-04-23: qty 2

## 2022-04-23 MED ORDER — POLYETHYLENE GLYCOL 3350 17 G PO PACK: 17.0000 g | PACK | Freq: Every day | ORAL | 0 refills | Status: AC

## 2022-04-23 MED ORDER — CIPROFLOXACIN HCL 250 MG PO TABS
250.0000 mg | ORAL_TABLET | Freq: Once | ORAL | Status: AC
  Administered 2022-04-23: 250 mg via ORAL
  Filled 2022-04-23: qty 1

## 2022-04-23 MED ORDER — PHENAZOPYRIDINE HCL 200 MG PO TABS: 200.0000 mg | ORAL_TABLET | Freq: Three times a day (TID) | ORAL | 0 refills | Status: DC

## 2022-04-23 NOTE — ED Notes (Signed)
Family updated as to pt status. Daughter Terrence Dupont asked to be contacted once new update is available.   [phone]

## 2022-04-23 NOTE — ED Triage Notes (Signed)
Pt c/o lower abdominal pain, low back pain, and dysuria since mid-March.  Pain score 8/10.  Pt reports "it feels like everything is trying to fall out of my vagina."  Pt reports chronic UTI.  Sts she has taken 2 different antibiotics w/o relief.

## 2022-04-23 NOTE — Patient Instructions (Signed)
Sepsis, Diagnosis, Adult Sepsis is a serious bodily reaction to an infection. The infection that triggers sepsis may be from a bacteria, virus, or fungus. Sepsis can result from an infection in any part of your body. Infections that commonly lead to sepsis include skin, lung, and urinary tract infections. Sepsis is a medical emergency that must be treated right away in a hospital. In severe cases, it can lead to septic shock. Septic shock can weaken your heart and cause your blood pressure to drop. This can cause your central nervous system and your body's organs to stop working. What are the causes? This condition is caused by a severe reaction to infections from bacteria, viruses, or fungus. The germs that most often lead to sepsis include: Escherichia coli (E. coli) bacteria. Staphylococcus aureus (staph) bacteria. Some types of Streptococcus bacteria. The most common infections affect these organs: The lung (pneumonia). The kidneys or bladder (urinary tract infection). The skin (cellulitis). The bowel, gallbladder, or pancreas. What increases the risk? You are more likely to develop this condition if: Your body's disease-fighting system (immune system) is weakened. You are age 65 or older. You are female. You had surgery or you have been hospitalized. You have these devices inserted into your body: A small, thin tube (catheter). IV line. Breathing tube. Drainage tube. You are not getting enough nutrients from food (malnourished). You have a chronic disease, such as cancer, lung disease, kidney disease, or diabetes. What are the signs or symptoms? Symptoms of this condition may include: Fever. Chills or feeling very cold. Confusion or anxiety. Fatigue. Muscle aches. Shortness of breath or rapid breathing (hyperventilation). Nausea and vomiting. Urinating much less than usual. Fast heart rate. Changes in skin color. Your skin may look blotchy, pale, or blue. Cool, clammy, or  sweaty skin. Skin rash. Other symptoms depend on the source of your infection. How is this diagnosed? This condition is diagnosed based on your symptoms, medical history, and physical exam. Other tests may also be done to find out the cause of the infection and how severe the sepsis is. Tests may include: Blood tests. Urine tests. Swabs from other areas of your body that may have an infection. These samples may be tested (cultured) to find out what type of bacteria is causing the infection. Chest X-[place] to check for pneumonia. Other imaging tests, such as a CT scan, may also be done. Lumbar puncture. This removes a small amount of the fluid that surrounds your brain and spinal cord. The fluid is then examined for infection. How is this treated? This condition must be treated in a hospital. Based on the cause of your infection, you may be given an antibiotic, antiviral, or antifungal medicine. You may also receive: Fluids through an IV. Oxygen and breathing assistance. Medicines to increase your blood pressure. Kidney dialysis. This process cleans your blood if your kidneys have failed. Surgery to remove infected tissue. Blood transfusion if needed. Medicine to prevent blood clots. Nutrients to correct imbalances in basic body function (metabolism). You may: Receive important salts and minerals (electrolytes) through an IV. Have your blood sugar level adjusted. Follow these instructions at home: Medicines  Take over-the-counter and prescription medicines only as told by your health care provider. If you were prescribed an antibiotic, antiviral, or antifungal medicine, take it as told by your health care provider. Do not stop taking the medicine even if you start to feel better. General instructions If you have a catheter or other indwelling device, ask to have it removed   as soon as possible. Keep all follow-up visits. This is important. Contact a health care provider if: You do not feel  like you are getting better or regaining strength. You are having trouble coping with your recovery. You frequently feel tired. You feel worse or do not seem to get better after surgery. You think you may have an infection after surgery. Get help right away if: You have any symptoms of sepsis. You have difficulty breathing. You have a rapid or skipping heartbeat. You become confused or disoriented. You have a high fever. Your skin becomes blotchy, pale, or blue. You have an infection that is getting worse or not getting better. These symptoms may represent a serious problem that is an emergency. Do not wait to see if the symptoms will go away. Get medical help right away. Call your local emergency services (911 in the [place] Do not drive yourself to the hospital. Summary Sepsis is a medical emergency that requires immediate treatment in a hospital. This condition is caused by a severe reaction to infections from bacteria, viruses, or fungus. Based on the cause of your infection, you may be given an antibiotic, antiviral, or antifungal medicine. Treatment may also include IV fluids, breathing assistance, and kidney dialysis. This information is not intended to replace advice given to you by your health care provider. Make sure you discuss any questions you have with your health care provider. Document Revised: 11/19/2019 Document Reviewed: 11/22/2019 Elsevier Patient Education  [place].

## 2022-04-23 NOTE — ED Notes (Signed)
Patient transported to CT 

## 2022-04-23 NOTE — Discharge Instructions (Addendum)
As discussed you are being treated for urinary tract infection, although this is not a severe infection, there are enough bacteria and white blood cells in your urine that we are covering you with an antibiotic called Cipro.  This is a cousin to Levaquin, but may not cause similar side effects as Levaquin.  You are also being prescribed Pyridium which is a medicine to help with your pain symptoms.  This medicine will turn your urine bright yellow-orange and is normal.  As discussed you do have significant amounts of urine in your bladder after you have urinated which I think is a complication of your bladder prolapse and is causing at least some of your symptoms of discomfort.  A short term solution until you can get in with the urologist would be a Foley catheter to help drain your bladder, but I understand your reluctance to do this.  Please call your urologist tomorrow to try to move up your appointment.  Also, you do have moderate constipation which can be a source of your current symptoms as well.  You have been prescribed MiraLAX to help with this problem.    In the interim get rechecked by your primary doctor or by returning here if you have any new or worsening symptoms.

## 2022-04-23 NOTE — ED Notes (Signed)
Pt given a warm blanket 

## 2022-04-23 NOTE — ED Provider Notes (Signed)
[facility] Provider Note   CSN: [id number] Arrival date & time: 04/23/22  1309     History  Chief Complaint  Patient presents with   Abdominal Pain   Dysuria    Melissa Paul is a 77 y.o. female with a history including cardiomyopathy, hyperlipidemia, paroxysmal A-fib, history of cyst on her right kidney, CHF and frequent UTIs presenting with low pelvic abdominal pain along with dysuria and right flank pain.  She also endorses increasing episodes of urinary incontinence for the past several weeks.  She was seen by her primary provider 2 weeks ago at which time she was diagnosed with a UTI and completed to courses of antibiotics but continues to have both right flank pain and midline suprapubic pain.  She also has a known bladder prolapse which she feels is getting worse and is scheduled to see a urologist next month for this condition.  Pessary was tried without improvement in the symptom.  She denies fevers or chills, she describes constant pain in her suprapubic area and a pressure sensation at her vagina.  She denies vaginal discharge, itching or rash.  She denies hematuria, fevers or chills.  She has found no alleviators for her symptoms.  The history is provided by the patient.       Home Medications Prior to Admission medications   Medication Sig Start Date End Date Taking? Authorizing Provider  ciprofloxacin (CIPRO) 250 MG tablet Take 1 tablet (250 mg total) by mouth every 12 (twelve) hours. 04/23/22  Yes Kila Godina, Almyra Free, PA-C  phenazopyridine (PYRIDIUM) 200 MG tablet Take 1 tablet (200 mg total) by mouth 3 (three) times daily. 04/23/22  Yes Brighten Buzzelli, Almyra Free, PA-C  polyethylene glycol (MIRALAX) 17 g packet Take 17 g by mouth daily. 04/23/22  Yes Mattox Schorr, Almyra Free, PA-C  acetaminophen (TYLENOL) 650 MG CR tablet Take 650 mg by mouth every 8 (eight) hours as needed for pain.    [provider]  albuterol (PROVENTIL) (2.5 MG/3ML) 0.083% nebulizer  solution Take 3 mLs (2.5 mg total) by nebulization every 6 (six) hours as needed for wheezing or shortness of breath. 01/09/22   Gwenlyn Perking, FNP  apixaban (ELIQUIS) 5 MG TABS tablet Take 1 tablet (5 mg total) by mouth 2 (two) times daily. Need to schedule an appointment for further refills 08/21/20   [name], MD  blood glucose meter kit and supplies 1 each by Other route 2 (two) times daily as needed for other. Dispense based on patient and insurance preference. . (FOR ICD-10 E10.9, E11.9).Dispense based on patient and insurance preference. 07/30/21   Sharion Balloon, FNP  carvedilol (COREG) 12.5 MG tablet Take 1 tablet by mouth twice daily 08/20/21   [name], MD  dapagliflozin propanediol (FARXIGA) 10 MG TABS tablet Take 1 tablet (10 mg total) by mouth daily. 09/19/21   Sharion Balloon, FNP  furosemide (LASIX) 80 MG tablet Take 1 tablet (80 mg total) by mouth daily as needed. As needed for fluid (NEEDS TO BE SEEN BEFORE NEXT REFILL) 03/25/22   Evelina Dun A, FNP  glucose blood (GLUCOSE METER TEST) test strip Use BID 10/12/21   Evelina Dun A, FNP  hydrOXYzine (VISTARIL) 25 MG capsule TAKE 1 CAPSULE BY MOUTH THREE TIMES DAILY Patient taking differently: Take 25 mg by mouth daily. 07/12/21   Sharion Balloon, FNP  levocetirizine (XYZAL) 5 MG tablet Take 1 tablet (5 mg total) by mouth every evening. 01/09/22   Lilia Pro,  Arman Bogus, FNP  levothyroxine (SYNTHROID) 88 MCG tablet TAKE 1 TABLET BY MOUTH ONCE DAILY BEFORE BREAKFAST 10/25/21   Evelina Dun A, FNP  metFORMIN (GLUCOPHAGE-XR) 750 MG 24 hr tablet Take 1 tablet (750 mg total) by mouth 2 (two) times daily. (NEEDS TO BE SEEN BEFORE NEXT REFILL) 03/25/22   Evelina Dun A, FNP  pantoprazole (PROTONIX) 40 MG tablet TAKE 1 TABLET BY MOUTH ONCE DAILY BEFORE BREAKFAST 08/20/21   Hawks, Alyse Low A, FNP  potassium chloride SA (KLOR-CON M) 20 MEQ tablet Take 1 tablet (20 mEq total) by mouth as directed. Only take 1 tablet with extra doses of  Lasix 12/04/21   Rafael Bihari, FNP  spironolactone (ALDACTONE) 25 MG tablet Take 1 tablet by mouth once daily 08/20/21   Bensimhon, Shaune Pascal, MD  Vericiguat 5 MG TABS Take 1 tablet (5 mg total) by mouth daily. 12/04/21   Mil[place], Maricela Bo, FNP      Allergies    Buspar [buspirone], Lexapro [escitalopram oxalate], Trulicity [dulaglutide], Bactrim, Cephalexin, Chlorhexidine gluconate, Clindamycin, Contrast media [iodinated contrast media], Crestor [rosuvastatin], Doxycycline, Latex, Levofloxacin, Nitrofurantoin monohyd macro, and Penicillins    Review of Systems   Review of Systems  Constitutional:  Negative for fever.  HENT:  Negative for congestion and sore throat.   Eyes: Negative.   Respiratory:  Negative for chest tightness and shortness of breath.   Cardiovascular:  Negative for chest pain.  Gastrointestinal:  Positive for abdominal pain and nausea. Negative for vomiting.  Genitourinary:  Positive for dysuria, flank pain, pelvic pain and vaginal pain. Negative for hematuria and vaginal discharge.  Musculoskeletal:  Negative for arthralgias, joint swelling and neck pain.  Skin: Negative.  Negative for rash and wound.  Neurological:  Negative for dizziness, weakness, light-headedness, numbness and headaches.  Psychiatric/Behavioral: Negative.    All other systems reviewed and are negative.   Physical Exam Updated Vital Signs BP 113/61   Pulse 66   Temp (!) 97.5 F (36.4 C) (Oral)   Resp 16   Ht 5\' 2"  (1.575 m)   Wt 78.9 kg   SpO2 100%   BMI 31.83 kg/m  Physical Exam Vitals and nursing note reviewed. Exam conducted with a chaperone present.  Constitutional:      Appearance: She is well-developed.  HENT:     Head: Normocephalic and atraumatic.  Eyes:     Conjunctiva/sclera: Conjunctivae normal.  Cardiovascular:     Rate and Rhythm: Normal rate and regular rhythm.     Heart sounds: Normal heart sounds.  Pulmonary:     Effort: Pulmonary effort is normal.     Breath  sounds: Normal breath sounds. No wheezing.  Abdominal:     General: Bowel sounds are normal.     Palpations: Abdomen is soft.     Tenderness: There is abdominal tenderness in the suprapubic area. There is right CVA tenderness. There is no guarding or rebound.  Genitourinary:    Labia:        Right: No rash or tenderness.        Left: No rash or tenderness.      Vagina: No vaginal discharge or erythema.     Comments: Mild prolapse of bladder noted,  no external prolapse noted.  Musculoskeletal:        General: Normal range of motion.     Cervical back: Normal range of motion.  Skin:    General: Skin is warm and dry.  Neurological:     Mental Status: She is  alert.     ED Results / Procedures / Treatments   Labs (all labs ordered are listed, but only abnormal results are displayed) Labs Reviewed  WET PREP, GENITAL - Abnormal; Notable for the following components:      Result Value   WBC, Wet Prep HPF POC >=10 (*)    All other components within normal limits  COMPREHENSIVE METABOLIC PANEL - Abnormal; Notable for the following components:   Potassium 3.4 (*)    Glucose, Bld 109 (*)    Creatinine, Ser 1.18 (*)    Calcium 8.8 (*)    AST 13 (*)    GFR, Estimated 47 (*)    All other components within normal limits  CBC - Abnormal; Notable for the following components:   Hemoglobin 9.9 (*)    HCT 32.9 (*)    MCH 24.1 (*)    RDW 17.9 (*)    All other components within normal limits  URINALYSIS, ROUTINE W REFLEX MICROSCOPIC - Abnormal; Notable for the following components:   APPearance HAZY (*)    Glucose, UA >=500 (*)    Leukocytes,Ua SMALL (*)    Bacteria, UA FEW (*)    All other components within normal limits  URINE CULTURE  LIPASE, BLOOD    EKG None  Radiology CT Renal Stone Study  Result Date: 04/23/2022 CLINICAL DATA:  Flank pain with dysuria EXAM: CT ABDOMEN AND PELVIS WITHOUT CONTRAST TECHNIQUE: Multidetector CT imaging of the abdomen and pelvis was performed  following the standard protocol without IV contrast. RADIATION DOSE REDUCTION: This exam was performed according to the departmental dose-optimization program which includes automated exposure control, adjustment of the mA and/or kV according to patient size and/or use of iterative reconstruction technique. COMPARISON:  CT 12/25/2018, 08/21/2017 FINDINGS: Lower chest: Lung bases are clear. Incompletely visualized cardiac pacing leads. Cardiomegaly. Small hiatal hernia Hepatobiliary: No focal liver abnormality is seen. Status post cholecystectomy. No biliary dilatation. Pancreas: Unremarkable. No pancreatic ductal dilatation or surrounding inflammatory changes. Spleen: Normal in size without focal abnormality. Adrenals/Urinary Tract: Adrenal glands are normal. Kidneys show no hydronephrosis. Multiple low-density lesions in the right kidney incompletely characterized without contrast. Interval areas of punctate hyperdensity within some of the lesions. No ureteral stone. Bladder is normal Stomach/Bowel: Stomach is within normal limits. Appendectomy. Diverticular disease of the colon without acute inflammatory process. Large stool burden. No evidence of bowel wall thickening, distention, or inflammatory changes. Vascular/Lymphatic: Moderate aortic atherosclerosis. No aneurysm. No suspicious lymph nodes Reproductive: Status post hysterectomy. No adnexal masses. Other: Negative for pelvic effusion or free air. Small periumbilical fat containing hernia. Several adjacent fat containing supraumbilical ventral hernias. Musculoskeletal: No acute or significant osseous findings. IMPRESSION: 1. No CT evidence for acute intra-abdominal or pelvic abnormality. Negative for hydronephrosis or ureteral stone. 2. Diverticular disease of the colon without acute inflammatory process. 3. Aortic atherosclerosis. 4. Multiple fat containing ventral hernia Aortic Atherosclerosis (ICD10-I70.0). Electronically Signed   By: Donavan Foil M.D.    On: 04/23/2022 19:12    Procedures Procedures    Medications Ordered in ED Medications  phenazopyridine (PYRIDIUM) tablet 200 mg (has no administration in time range)  ciprofloxacin (CIPRO) tablet 250 mg (has no administration in time range)    ED Course/ Medical Decision Making/ A&P                             Medical Decision Making Patient presenting with chronic suprapubic pain and pressure, dysuria and recent  multiple UTIs which have not responded to antibiotics including fosfomycin and clarithromycin.  She does have multiple allergies to antibiotics.  She has a known bladder prolapse and is currently awaiting consultation with urology, her appointment is the end of May.  We obtained a CT renal stone study as she endorses pain that radiates into her right flank region.  She does not have a kidney stone, also she has no hydronephrosis however there are small punctate lesions within the right kidney of unclear etiology.  These were also seen on prior CT imaging.  She does have a moderate stool burden as well.  Which could be contributing to her pelvic pressure.  A urine culture has been sent, in the interim we will place her on a short course of Cipro, she was also prescribed Pyridium to help with pain symptoms.  We obtained a bladder scan postvoid residual with greater than 350 cc of residual urine, probably complication of her known bladder prolapse.  Patient was offered a Foley catheter for short-term relief while she is awaiting her urology consult, however she has declined this treatment.  We discussed urinary retention can lead to kidney problems if it becomes severe, she understands this possible complication.  She will call urology tomorrow to see if she can get her appointment moved up.  Amount and/or Complexity of Data Reviewed Labs: ordered.    Details: Labs including a normal wet prep, her urinalysis significant for small amount of leukocytes, 11-20 and a few bacteria, this was  a clean-catch specimen.  Her c-Met is reassuring, she does have a creatinine of 1.18 Which is a stable finding, historically her creatinines have ranged from 1.19-1.3 to.  She also has a significant anemia at 9.9, this is a normocytic anemia and is also a chronic finding.             Final Clinical Impression(s) / ED Diagnoses Final diagnoses:  Dysuria  Urinary retention  Constipation, unspecified constipation type    Rx / DC Orders ED Discharge Orders          Ordered    phenazopyridine (PYRIDIUM) 200 MG tablet  3 times daily        04/23/22 2048    ciprofloxacin (CIPRO) 250 MG tablet  Every 12 hours        04/23/22 2048    polyethylene glycol (MIRALAX) 17 g packet  Daily        04/23/22 2059              Evalee Jefferson, Hershal Coria 04/23/22 2100    Cristie Hem, MD 04/24/22 1241

## 2022-04-23 NOTE — ED Notes (Signed)
Pt states being treated for a UTI multiple times and is still having foul smelling urine with a dull, aching pain from her lower abdomen to her right kidney.

## 2022-04-23 NOTE — Progress Notes (Signed)
Subjective:    Patient ID: Melissa Paul, female    DOB: 1945/04/27, 77 y.o.   MRN: [id number]  Chief Complaint  Patient presents with   Urinary Tract Infection   PT presents to the office today with recurrent UTI symptoms. She was seen on 04/04/22 and started on Clarithromycin. She continued to have symptoms she was then prescribed Fosfomycin. Reports this did not help. We placed a referral to Urologists, however, that is not until 06/13/22.  She has multiple allergies.  Urinary Tract Infection  This is a recurrent problem. The current episode started more than 1 month ago. The problem occurs intermittently. The problem has been waxing and waning. The quality of the pain is described as aching and burning. The pain is at a severity of 8/10. The pain is mild. Associated symptoms include frequency, urgency and vomiting. Pertinent negatives include no hematuria or nausea.      Review of Systems  Gastrointestinal:  Positive for vomiting. Negative for nausea.  Genitourinary:  Positive for frequency and urgency. Negative for hematuria.  All other systems reviewed and are negative.      Objective:   Physical Exam Vitals reviewed.  Constitutional:      General: She is not in acute distress.    Appearance: She is well-developed.  HENT:     Head: Normocephalic and atraumatic.  Eyes:     Pupils: Pupils are equal, round, and reactive to light.  Neck:     Thyroid: No thyromegaly.  Cardiovascular:     Rate and Rhythm: Normal rate and regular rhythm.     Heart sounds: Normal heart sounds. No murmur heard. Pulmonary:     Effort: Pulmonary effort is normal. No respiratory distress.     Breath sounds: Normal breath sounds. No wheezing.  Abdominal:     General: Bowel sounds are normal. There is no distension.     Palpations: Abdomen is soft.     Tenderness: There is no abdominal tenderness.  Musculoskeletal:        General: No tenderness. Normal range of motion.     Cervical back:  Normal range of motion and neck supple.  Skin:    General: Skin is warm and dry.  Neurological:     Mental Status: She is alert and oriented to person, place, and time.     Cranial Nerves: No cranial nerve deficit.     Deep Tendon Reflexes: Reflexes are normal and symmetric.  Psychiatric:        Behavior: Behavior normal.        Thought Content: Thought content normal.        Judgment: Judgment normal.       BP (!) 98/56   Pulse 74   Temp (!) 97 F (36.1 C) (Temporal)   Ht 5\' 2"  (1.575 m)   Wt 174 lb (78.9 kg)   SpO2 99%   BMI 31.83 kg/m      Assessment & Plan:  Melissa Paul comes in today with chief complaint of Urinary Tract Infection   Diagnosis and orders addressed:  1. Recurrent UTI - Urinalysis, Complete - Urine Culture  2. Multiple drug allergies  3. Hypotension, unspecified hypotension type    Pt unable to leave urine, but given symptoms of pain and decrease urine output and hypotension pt is sent to ED. She does not wish to have EMS called as she is currently watching her grandchild and needs to find a babysitter first and then will go to  ED.   Evelina Dun, FNP

## 2022-04-23 NOTE — ED Notes (Signed)
Melissa Paul, called and identified herself as the pts daughter and is requesting to be updated at [phone]

## 2022-04-25 ENCOUNTER — Ambulatory Visit (INDEPENDENT_AMBULATORY_CARE_PROVIDER_SITE_OTHER): Payer: Medicare Other | Admitting: Orthopaedic Surgery

## 2022-04-25 ENCOUNTER — Encounter: Payer: Self-pay | Admitting: Orthopaedic Surgery

## 2022-04-25 VITALS — Ht 62.5 in | Wt 174.0 lb

## 2022-04-25 DIAGNOSIS — M170 Bilateral primary osteoarthritis of knee: Secondary | ICD-10-CM

## 2022-04-25 MED ORDER — LIDOCAINE HCL 1 % IJ SOLN
0.5000 mL | INTRAMUSCULAR | Status: AC | PRN
Start: 2022-04-25 — End: 2022-04-25
  Administered 2022-04-25: .5 mL

## 2022-04-25 MED ORDER — BUPIVACAINE HCL 0.25 % IJ SOLN
4.0000 mL | INTRAMUSCULAR | Status: AC | PRN
Start: 2022-04-25 — End: 2022-04-25
  Administered 2022-04-25: 4 mL via INTRA_ARTICULAR

## 2022-04-25 MED ORDER — METHYLPREDNISOLONE ACETATE 40 MG/ML IJ SUSP
40.0000 mg | INTRAMUSCULAR | Status: AC | PRN
Start: 2022-04-25 — End: 2022-04-25
  Administered 2022-04-25: 40 mg via INTRA_ARTICULAR

## 2022-04-25 MED ORDER — BUPIVACAINE HCL 0.5 % IJ SOLN
3.0000 mL | INTRAMUSCULAR | Status: AC | PRN
Start: 2022-04-25 — End: 2022-04-25
  Administered 2022-04-25: 3 mL via INTRA_ARTICULAR

## 2022-04-25 NOTE — Progress Notes (Signed)
Office Visit Note   Patient: Melissa Paul           Date of Birth: 1945/03/18           MRN: [id number] Visit Date: 04/25/2022              Requested by: Sharion Balloon, [address] [place],  [place] [address] PCP: Sharion Balloon, FNP   Assessment & Plan: Visit Diagnoses:  1. Bilateral primary osteoarthritis of knee     Plan: Bilateral knee injection performed she can follow-up as needed.  Follow-Up Instructions: No follow-ups on file.   Orders:  No orders of the defined types were placed in this encounter.  No orders of the defined types were placed in this encounter.     Procedures: Large Joint Inj: R knee on 04/25/2022 3:13 PM Indications: pain and joint swelling Details: 22 G 1.5 in needle, anterolateral approach  Arthrogram: No  Medications: 40 mg methylPREDNISolone acetate 40 MG/ML; 0.5 mL lidocaine 1 %; 4 mL bupivacaine 0.25 % Outcome: tolerated well, no immediate complications Procedure, treatment alternatives, risks and benefits explained, specific risks discussed. Consent was given by the patient. Immediately prior to procedure a time out was called to verify the correct patient, procedure, equipment, support staff and site/side marked as required. Patient was prepped and draped in the usual sterile fashion.    Large Joint Inj: L knee on 04/25/2022 3:14 PM Indications: joint swelling and pain Details: 22 G 1.5 in needle, anterolateral approach  Arthrogram: No  Medications: 0.5 mL lidocaine 1 %; 3 mL bupivacaine 0.5 %; 40 mg methylPREDNISolone acetate 40 MG/ML Outcome: tolerated well, no immediate complications Procedure, treatment alternatives, risks and benefits explained, specific risks discussed. Consent was given by the patient. Immediately prior to procedure a time out was called to verify the correct patient, procedure, equipment, support staff and site/side marked as required. Patient was prepped and draped in the usual sterile fashion.        Clinical Data: No additional findings.   Subjective: Chief Complaint  Patient presents with   Right Knee - Pain   Left Knee - Pain    HPI 77 year old female returns with bilateral knee osteoarthritis bone-on-bone changes.  Previous injection last year in November gave her good relief for many months and she is requesting repeat injection right and left knee.  She does have diabetes A1c is less than 7 and her glucose usually runs 90-130.  Patient has a heart failure and LVEF is stable in the range of 25-30 % with moderate mitral regurg.  Review of Systems all other systems noncontributory to HPI.   Objective: Vital Signs: Ht 5' 2.5" (1.588 m)   Wt 174 lb (78.9 kg)   BMI 31.32 kg/m   Physical Exam Constitutional:      Appearance: She is well-developed.  HENT:     Head: Normocephalic.     Right Ear: External ear normal.     Left Ear: External ear normal. There is no impacted cerumen.  Eyes:     Pupils: Pupils are equal, round, and reactive to light.  Neck:     Thyroid: No thyromegaly.     Trachea: No tracheal deviation.  Cardiovascular:     Rate and Rhythm: Normal rate.  Pulmonary:     Effort: Pulmonary effort is normal.  Abdominal:     Palpations: Abdomen is soft.  Musculoskeletal:     Cervical back: No rigidity.  Skin:    General:  Skin is warm and dry.  Neurological:     Mental Status: She is alert and oriented to person, place, and time.  Psychiatric:        Behavior: Behavior normal.     Ortho Exam bilateral knee crepitus 2+ knee effusion tenderness medial lateral joint line.  Specialty Comments:  No specialty comments available.  Imaging: No results found.   PMFS History: Patient Active Problem List   Diagnosis Date Noted   Multiple drug allergies 04/09/2022   Microcytic anemia 12/24/2021   Rectal bleeding 12/24/2021   Hematest positive stools 12/24/2021   Abdominal pain, chronic, epigastric 12/24/2021   Bilateral primary  osteoarthritis of knee 12/20/2021   Gastroesophageal reflux disease 10/12/2021   Atherosclerosis of aorta 10/12/2021   Stage 3b chronic kidney disease 03/29/2021   Vaginal vault prolapse after hysterectomy 0000000   Lichen sclerosus et atrophicus 10/17/2020   Cystocele, midline 09/22/2020   Recurrent UTI 04/26/2020   Osteoporosis 11/16/2019   GAD (generalized anxiety disorder) 10/19/2018   Constipation 11/12/2017   Cyst of right kidney 11/12/2017   Type 2 diabetes mellitus with hyperglycemia, without long-term current use of insulin 08/11/2017   Acute on chronic systolic ACC/AHA stage C congestive heart failure 06/08/2014   Abnormal myocardial perfusion study    Exertional dyspnea - NYHA III CHF 04/11/2014   Abnormal nuclear stress test 04/11/2014   Ventricular tachyarrhythmia 03/17/2014   Atrial fibrillation 10/10/2011   Chronic hypotension 03/14/2011   Automatic implantable cardioverter-defibrillator in situ 09/13/2010   MITRAL REGURGITATION [id number]   Chronic systolic heart failure [id number]   Hypothyroidism 11/29/2008   Nonischemic cardiomyopathy 11/29/2008   LBBB 11/29/2008   Past Medical History:  Diagnosis Date   Anxiety    Chronic systolic heart failure    Constipation    Cyst of right kidney 11/12/2017   Glucose intolerance (pre-diabetes)    Hyperlipemia    Hypothyroidism    LBBB (left bundle branch block)    Mitral regurgitation    Mild to Moderate   Nonischemic cardiomyopathy    LVEF 35-40% by echo 8/13   Paroxysmal atrial fibrillation    Brief episodes by device interrogation   UTI (urinary tract infection)    Recurrent   Ventricular tachycardia    ICD therapy for VT    Family History  Problem Relation Age of Onset   CVA Mother        multiple   Heart Problems Mother        "weak heart"   Heart disease Maternal Grandmother    Heart disease Maternal Grandfather    Cancer Father        Stomach and eshpogus   Diabetes Neg Hx    Hypertension  Neg Hx    Coronary artery disease Neg Hx     Past Surgical History:  Procedure Laterality Date   APPENDECTOMY     BIV ICD GENERATOR CHANGEOUT N/A 07/11/2016   Procedure: BiV ICD Generator Changeout;  Surgeon: Thompson Grayer, MD;  Location: [facility];  Service: Cardiovascular;  Laterality: N/A;   CARDIAC DEFIBRILLATOR PLACEMENT  09/2009   St. Jude BiV ICD by Dr. Sandria Manly class III   CHOLECYSTECTOMY     has one ovary left     KNEE ARTHROSCOPY     Left   LEFT AND RIGHT HEART CATHETERIZATION WITH CORONARY ANGIOGRAM N/A 04/12/2014   Procedure: LEFT AND RIGHT HEART CATHETERIZATION WITH CORONARY ANGIOGRAM;  Surgeon: Leonie Man, MD;  Location: [facility];  Service: Cardiovascular;  Laterality: N/A;   Left breast biopsy x 2     no maliganancy   PACEMAKER IMPLANT     RIGHT HEART CATH N/A 10/27/2020   Procedure: RIGHT HEART CATH;  Surgeon: [name], MD;  Location: [facility];  Service: Cardiovascular;  Laterality: N/A;   RIGHT/LEFT HEART CATH AND CORONARY ANGIOGRAPHY N/A 03/26/2019   Procedure: RIGHT/LEFT HEART CATH AND CORONARY ANGIOGRAPHY;  Surgeon: [name], MD;  Location: [facility];  Service: Cardiovascular;  Laterality: N/A;   TOTAL ABDOMINAL HYSTERECTOMY     (R) ovary removed   Social History   Occupational History   Not on file  Tobacco Use   Smoking status: Former    Packs/day: 0.30    Years: 20.00    Additional pack years: 0.00    Total pack years: 6.00    Types: Cigarettes    Start date: 08/03/1949    Quit date: 01/22/1996    Years since quitting: 26.2    Passive exposure: Never   Smokeless tobacco: Never  Vaping Use   Vaping Use: Never used  Substance and Sexual Activity   Alcohol use: No    Alcohol/week: 0.0 standard drinks of alcohol   Drug use: No   Sexual activity: Not Currently    Birth control/protection: Surgical    Comment: hyst

## 2022-04-26 ENCOUNTER — Other Ambulatory Visit: Payer: Self-pay

## 2022-04-26 ENCOUNTER — Encounter: Payer: Self-pay | Admitting: Family

## 2022-04-26 ENCOUNTER — Telehealth: Payer: Self-pay

## 2022-04-26 DIAGNOSIS — R928 Other abnormal and inconclusive findings on diagnostic imaging of breast: Secondary | ICD-10-CM

## 2022-04-26 DIAGNOSIS — R921 Mammographic calcification found on diagnostic imaging of breast: Secondary | ICD-10-CM

## 2022-04-26 LAB — URINE CULTURE: Culture: >=100000 — AB

## 2022-04-26 NOTE — Telephone Encounter (Signed)
     Patient  visit on 04/23/2022  at [facility] was for abdominal pain, dysuria.  Have you been able to follow up with your primary care physician? Yes, patient is on wait list for earliest appointment.  The patient was or was not able to obtain any needed medicine or equipment. Patient is able to obtain medication.  Are there diet recommendations that you are having difficulty following? No  Patient expresses understanding of discharge instructions and education provided has no other needs at this time. Yes   Tilman Mcclaren [facility]  [facility] Population Health Community Resource Care Guide   [email or web address]  ?? [phone]   Website: [email or web address]  [email or web address]

## 2022-04-27 ENCOUNTER — Telehealth (HOSPITAL_BASED_OUTPATIENT_CLINIC_OR_DEPARTMENT_OTHER): Payer: Self-pay | Admitting: *Deleted

## 2022-04-27 NOTE — Telephone Encounter (Signed)
Post ED Visit - Positive Culture Follow-up: Unsuccessful Patient Follow-up  Culture assessed and recommendations reviewed by:  []  Enzo Bi, Pharm.D. []  Celedonio Miyamoto, Pharm.D., BCPS AQ-ID []  Garvin Fila, Pharm.D., BCPS []  Georgina Pillion, Pharm.D., BCPS []  [place], [place].D., BCPS, AAHIVP []  Estella Husk, Pharm.D., BCPS, AAHIVP []  Sherlynn Carbon, PharmD []  Pollyann Samples, PharmD, BCPS  Positive urine culture  []  Patient discharged without antimicrobial prescription and treatment is now indicated [x]  Organism is resistant to prescribed ED discharge antimicrobial []  Patient with positive blood cultures  Plan:  Stop Cipro.  Come to [facility] ED for Fosfomycin 3g IV x 1.  Left message to call back with no response so letter sent to address on file.   Unable to contact patient after 3 attempts, letter will be sent to address on file  Lysle Pearl 04/27/2022, 3:39 PM

## 2022-04-27 NOTE — Progress Notes (Signed)
ED Antimicrobial Stewardship Positive Culture Follow Up   Melissa Paul is an 77 y.o. female who presented to [facility] on 04/23/2022 with a chief complaint of abdominal pain, low back pain, dysuria, and urinary retention since mid-March. Patient has a history of recurrent UTI and has been on several antibiotics recently with no relief of symptoms. She declined a foley catheter during her most recent ED admission. Given her lack of antibiotic treatment options for ESBL and multitude of allergies, will attempt a one-time dose of fosfomycin in the ED.   Chief Complaint  Patient presents with   Abdominal Pain   Dysuria    Recent Results (from the past 720 hour(s))  Urine Culture     Status: Abnormal   Collection Time: 04/04/22  2:43 PM   Specimen: Urine   UR  Result Value Ref Range Status   Urine Culture, Routine Final report (A)  Final   Organism ID, Bacteria Comment (A)  Final    Comment: Escherichia coli, identified by an automated biochemical system. Multi-Drug Resistant Organism Susceptibility profile is consistent with a probable ESBL. Greater than 100,000 colony forming units per mL    Antimicrobial Susceptibility Comment  Final    Comment:       ** S = Susceptible; I = Intermediate; R = Resistant **                    P = Positive; N = Negative             MICS are expressed in micrograms per mL    Antibiotic                 RSLT#1    RSLT#2    RSLT#3    RSLT#4 Amoxicillin/Clavulanic Acid    S Ampicillin                     R Cefazolin                      R Cefepime                       S Ceftriaxone                    R Cefuroxime                     R Ciprofloxacin                  R Ertapenem                      S Gentamicin                     S Imipenem                       S Levofloxacin                   R Meropenem                      S Nitrofurantoin                 S Piperacillin/Tazobactam        S Tetracycline                   R  Tobramycin                      S Trimethoprim/Sulfa             R   Microscopic Examination     Status: Abnormal   Collection Time: 04/04/22  2:43 PM   Urine  Result Value Ref Range Status   WBC, UA 6-10 (A) 0 - 5 /hpf Final   RBC, Urine None seen 0 - 2 /hpf Final   Epithelial Cells (non renal) 0-10 0 - 10 /hpf Final   Renal Epithel, UA None seen None seen /hpf Final   Bacteria, UA Moderate (A) None seen/Few Final  Urine Culture     Status: Abnormal   Collection Time: 04/23/22  2:37 PM   Specimen: Urine, Suprapubic  Result Value Ref Range Status   Specimen Description   Final    URINE, SUPRAPUBIC Performed at [facility], [address], [place], [place] [address]    Special Requests   Final    NONE Performed at [facility], [address] [place], [place] [address]    Culture (A)  Final    >=100,000 COLONIES/mL ESCHERICHIA COLI Confirmed Extended Spectrum Beta-Lactamase Producer (ESBL).  In bloodstream infections from ESBL organisms, carbapenems are preferred over piperacillin/tazobactam. They are shown to have a lower risk of mortality.    Report Status 04/26/2022 FINAL  Final   Organism ID, Bacteria ESCHERICHIA COLI (A)  Final      Susceptibility   Escherichia coli - MIC*    AMPICILLIN >=32 RESISTANT Resistant     CEFAZOLIN >=64 RESISTANT Resistant     CEFEPIME 2 SENSITIVE Sensitive     CEFTRIAXONE >=64 RESISTANT Resistant     CIPROFLOXACIN >=4 RESISTANT Resistant     GENTAMICIN <=1 SENSITIVE Sensitive     IMIPENEM <=0.25 SENSITIVE Sensitive     NITROFURANTOIN <=16 SENSITIVE Sensitive     TRIMETH/SULFA >=320 RESISTANT Resistant     AMPICILLIN/SULBACTAM 4 SENSITIVE Sensitive     PIP/TAZO <=4 SENSITIVE Sensitive     * >=100,000 COLONIES/mL ESCHERICHIA COLI  Wet prep, genital     Status: Abnormal   Collection Time: 04/23/22  7:29 PM  Result Value Ref Range Status   Yeast Wet Prep HPF POC NONE SEEN NONE SEEN Final   Trich, Wet Prep NONE SEEN NONE SEEN Final   Clue Cells Wet Prep  HPF POC NONE SEEN NONE SEEN Final   WBC, Wet Prep HPF POC >=10 (A) <10 Final   Sperm NONE SEEN  Final    Comment: Performed at [facility], [address], [place], [place] [address]    [x]  Treated with Cipro, organism resistant to prescribed antimicrobial []  Patient discharged originally without antimicrobial agent and treatment is now indicated  Plan: Come to the [facility] Emergency Department to receive a one-time dose of fosfomycin 3g. If the patient continues to have symptoms, she should report back to the ED for receipt of IV carbapenem therapy.   ED Provider: Delice Bison, PA-C   Cherylin Mylar, PharmD PGY1 Pharmacy Resident 4/6/202411:06 AM  Clinical Pharmacist Monday - Friday phone -  [phone] Saturday - Sunday phone - [phone]

## 2022-05-02 NOTE — Progress Notes (Signed)
Cardiology Office Note  Date: 05/03/2022   ID: SADE WISCHMEYER, DOB 01-08-1946, MRN [id number]  History of Present Illness: Melissa Paul is a 77 y.o. female last seen in November 2023 in the heart failure clinic, I reviewed the note.  She is here for a follow-up visit.  Reports recent weight gain of a few pounds and a feeling of relative tightness in her chest.  She has started to increase her Lasix use, otherwise reports compliance with baseline therapy.  No palpitations or syncope.  St. Jude biventricular ICD in place with follow-up by Dr. Nelly Laurence.  Device check in March demonstrated biventricular pacing 91% of the time.  She does not report any interval device shocks or syncope.  I reviewed her medications, she reports no spontaneous bleeding problems on Eliquis.  Tolerating Farxiga, Aldactone, and Verquvo.  Recent lab work shows creatinine 1.18.  Physical Exam: VS:  BP 115/68   Pulse 71   Ht 5' 2.5" (1.588 m)   Wt 176 lb (79.8 kg)   SpO2 100%   BMI 31.68 kg/m , BMI Body mass index is 31.68 kg/m.  Wt Readings from Last 3 Encounters:  05/03/22 176 lb (79.8 kg)  04/25/22 174 lb (78.9 kg)  04/23/22 174 lb (78.9 kg)    General: Patient appears comfortable at rest. HEENT: Conjunctiva and lids normal. Neck: Supple, no elevated JVP or carotid bruits. Lungs: Clear to auscultation, nonlabored breathing at rest. Cardiac: Regular rate and rhythm, no S3 or significant systolic murmur. Extremities: No pitting edema.  ECG:  An ECG dated 12/04/2021 was personally reviewed today and demonstrated:  Dual chamber pacing.  Labwork: 10/12/2021: TSH 2.600 12/04/2021: B Natriuretic Peptide 535.2 04/23/2022: ALT 10; AST 13; BUN 22; Creatinine, Ser 1.18; Hemoglobin 9.9; Platelets 256; Potassium 3.4; Sodium 136     Component Value Date/Time   CHOL 160 10/12/2021 1613   TRIG 148 10/12/2021 1613   HDL 37 (L) 10/12/2021 1613   CHOLHDL 4.3 10/12/2021 1613   CHOLHDL 3.3 04/11/2007 0600    VLDL 10 04/11/2007 0600   LDLCALC 97 10/12/2021 1613   Other Studies Reviewed Today:  Echocardiogram 11/06/2021:  1. Left ventricular ejection fraction, by estimation, is 25 to 30%. The  left ventricle has severely decreased function. The left ventricle  demonstrates global hypokinesis. The left ventricular internal cavity size  was mildly dilated. Left ventricular  diastolic parameters are consistent with Grade II diastolic dysfunction  (pseudonormalization). Elevated left atrial pressure. The average left  ventricular global longitudinal strain is -10.0 %. The global longitudinal  strain is abnormal.   2. Right ventricular systolic function is normal. The right ventricular  size is normal. Tricuspid regurgitation signal is inadequate for assessing  PA pressure.   3. Left atrial size was moderately dilated.   4. Right atrial size was mild to moderately dilated.   5. The mitral valve is abnormal. Moderate mitral valve regurgitation. No  evidence of mitral stenosis.   6. The tricuspid valve is abnormal.   7. The aortic valve is tricuspid. Aortic valve regurgitation is not  visualized. No aortic stenosis is present.   8. The inferior vena cava is normal in size with greater than 50%  respiratory variability, suggesting right atrial pressure of 3 mmHg.   Assessment and Plan:  1.  HFrEF with nonischemic cardiomyopathy, LVEF 25 to 30% by echocardiogram in October 2023.  Did not tolerate Entresto previously due to lightheadedness and hypotension.  Blood pressure stable today.  Her weight is  up a few pounds and she has increased her Lasix temporarily.  Continue Coreg, Farxiga, Verquvo, Aldactone and Lasix with potassium supplement.  Follow-up echocardiogram prior to next visit in 6 months.  She will see the heart failure clinic in the interim.  2.  St. Jude biventricular ICD in place with follow-up with Dr. Nelly LaurenceMealor.  Has left bundle branch block at baseline.  Has prior history of polymorphic  VT in the setting of hypokalemia and gastrointestinal illness.  Also PVCs.  No recent device shocks or syncope.  3.  At risk for OSA, she has declined sleep testing.  4.  Paroxysmal atrial fibrillation with CHA2DS2-VASc score of 6.  She is on Eliquis for stroke prophylaxis.  No progressive palpitations reported.  5.  Mitral regurgitation, moderate by echocardiogram in October 2023.  This will be reevaluated with repeat echocardiogram as above.  Disposition:  Follow up  6 months.  Signed, Jonelle SidleSamuel G. Mckenna Boruff, M.D., F.A.C.C. [facility]

## 2022-05-03 ENCOUNTER — Telehealth (HOSPITAL_BASED_OUTPATIENT_CLINIC_OR_DEPARTMENT_OTHER): Payer: Self-pay

## 2022-05-03 ENCOUNTER — Encounter: Payer: Self-pay | Admitting: Cardiology

## 2022-05-03 ENCOUNTER — Ambulatory Visit: Payer: Medicare Other | Attending: Cardiology | Admitting: Cardiology

## 2022-05-03 VITALS — BP 115/68 | HR 71 | Ht 62.5 in | Wt 176.0 lb

## 2022-05-03 DIAGNOSIS — I5020 Unspecified systolic (congestive) heart failure: Secondary | ICD-10-CM | POA: Diagnosis not present

## 2022-05-03 DIAGNOSIS — I480 Paroxysmal atrial fibrillation: Secondary | ICD-10-CM | POA: Insufficient documentation

## 2022-05-03 NOTE — Patient Instructions (Addendum)
Medication Instructions:  Your physician recommends that you continue on your current medications as directed. Please refer to the Current Medication list given to you today.  Labwork: none  Testing/Procedures: Your physician has requested that you have an echocardiogram in 6 months just before your next visit. Echocardiography is a painless test that uses sound waves to create images of your heart. It provides your doctor with information about the size and shape of your heart and how well your heart's chambers and valves are working. This procedure takes approximately one hour. There are no restrictions for this procedure. Please do NOT wear cologne, perfume, aftershave, or lotions (deodorant is allowed). Please arrive 15 minutes prior to your appointment time.  Follow-Up: Your physician recommends that you schedule a follow-up appointment in: 6 months with Diona Browner Your physician recommends that you schedule a follow-up appointment in: 3 months with Dr. Shirlee Latch  Any Other Special Instructions Will Be Listed Below (If Applicable).  If you need a refill on your cardiac medications before your next appointment, please call your pharmacy.

## 2022-05-03 NOTE — Telephone Encounter (Signed)
Post ED Visit - Positive Culture Follow-up: Successful Patient Follow-Up  Culture assessed and recommendations reviewed by:  []  Enzo Bi, Pharm.D. []  Celedonio Miyamoto, Pharm.D., BCPS AQ-ID []  Garvin Fila, Pharm.D., BCPS []  Georgina Pillion, Pharm.D., BCPS []  [place], [place].D., BCPS, AAHIVP []  Estella Husk, Pharm.D., BCPS, AAHIVP []  Lysle Pearl, PharmD, BCPS []  Phillips Climes, PharmD, BCPS []  Agapito Games, PharmD, BCPS []  Verlan Friends, PharmD  Positive urine culture  []  Patient discharged without antimicrobial prescription and treatment is now indicated [x]  Organism is resistant to prescribed ED discharge antimicrobial []  Patient with positive blood cultures   Changes discussed with ED provider: Delice Bison, PA-C  Pt called back and stated that she did not take the Cipro because she is allergic to it and her pharmacy would not fill it. Instructions given for her to return to the [facility] ED for  Fosfomycin 3g x 1 IV then if symptoms continue report back to the ED for receipt of IV Carbapenem therapy. Pt stated she will be able to go to the ED tomorrow on 05/04/22 as she did not have a ride today.   Contacted patient, date 05/03/2022, time 3:30 pm   Sandria Senter 05/03/2022, 3:34 PM

## 2022-05-04 ENCOUNTER — Emergency Department (HOSPITAL_COMMUNITY)
Admission: EM | Admit: 2022-05-04 | Discharge: 2022-05-04 | Disposition: A | Discharge status: Home / Self Care | Payer: Medicare Other | Attending: Emergency Medicine | Admitting: Emergency Medicine

## 2022-05-04 ENCOUNTER — Other Ambulatory Visit: Payer: Self-pay

## 2022-05-04 DIAGNOSIS — R300 Dysuria: Secondary | ICD-10-CM | POA: Insufficient documentation

## 2022-05-04 DIAGNOSIS — E039 Hypothyroidism, unspecified: Secondary | ICD-10-CM | POA: Diagnosis not present

## 2022-05-04 DIAGNOSIS — I5022 Chronic systolic (congestive) heart failure: Secondary | ICD-10-CM | POA: Insufficient documentation

## 2022-05-04 DIAGNOSIS — Z7984 Long term (current) use of oral hypoglycemic drugs: Secondary | ICD-10-CM | POA: Insufficient documentation

## 2022-05-04 DIAGNOSIS — Z91040 Latex allergy status: Secondary | ICD-10-CM | POA: Diagnosis not present

## 2022-05-04 DIAGNOSIS — Z7901 Long term (current) use of anticoagulants: Secondary | ICD-10-CM | POA: Insufficient documentation

## 2022-05-04 DIAGNOSIS — Z79899 Other long term (current) drug therapy: Secondary | ICD-10-CM | POA: Diagnosis not present

## 2022-05-04 DIAGNOSIS — R109 Unspecified abdominal pain: Secondary | ICD-10-CM | POA: Diagnosis not present

## 2022-05-04 LAB — CBC
HCT: 34.2 % — ABNORMAL LOW (ref 36.0–46.0)
Hemoglobin: 10.3 g/dL — ABNORMAL LOW (ref 12.0–15.0)
MCH: 23.6 pg — ABNORMAL LOW (ref 26.0–34.0)
MCHC: 30.1 g/dL (ref 30.0–36.0)
MCV: 78.3 fL — ABNORMAL LOW (ref 80.0–100.0)
Platelets: 254 10*3/uL (ref 150–400)
RBC: 4.37 MIL/uL (ref 3.87–5.11)
RDW: 18.1 % — ABNORMAL HIGH (ref 11.5–15.5)
WBC: 8.3 10*3/uL (ref 4.0–10.5)
nRBC: 0 % (ref 0.0–0.2)

## 2022-05-04 LAB — URINALYSIS, ROUTINE W REFLEX MICROSCOPIC
Bilirubin Urine: NEGATIVE
Glucose, UA: >=500 mg/dL — AB
Hgb urine dipstick: NEGATIVE
Ketones, ur: NEGATIVE mg/dL
Nitrite: NEGATIVE
Protein, ur: NEGATIVE mg/dL
Specific Gravity, Urine: 1.017 (ref 1.005–1.030)
pH: 6 (ref 5.0–8.0)

## 2022-05-04 LAB — BASIC METABOLIC PANEL
Anion gap: 14 (ref 5–15)
BUN: 23 mg/dL (ref 8–23)
CO2: 29 mmol/L (ref 22–32)
Calcium: 9.2 mg/dL (ref 8.9–10.3)
Chloride: 91 mmol/L — ABNORMAL LOW (ref 98–111)
Creatinine, Ser: 1.4 mg/dL — ABNORMAL HIGH (ref 0.44–1.00)
GFR, Estimated: 39 mL/min — ABNORMAL LOW (ref >60)
Glucose, Bld: 138 mg/dL — ABNORMAL HIGH (ref 70–99)
Potassium: 4.6 mmol/L (ref 3.5–5.1)
Sodium: 134 mmol/L — ABNORMAL LOW (ref 135–145)

## 2022-05-04 MED ORDER — FOSFOMYCIN TROMETHAMINE 3 G PO PACK
3.0000 g | PACK | Freq: Once | ORAL | Status: AC
  Administered 2022-05-04: 3 g via ORAL
  Filled 2022-05-04: qty 3

## 2022-05-04 NOTE — ED Provider Notes (Signed)
[place] EMERGENCY DEPARTMENT AT [facility] Provider Note   CSN: [id number] Arrival date & time: 05/04/22  1453     History  Chief Complaint  Patient presents with   General Medical    Melissa Paul is a 77 y.o. female with medical history of anxiety, chronic systolic heart failure, hypothyroidism, hyperlipidemia, mitral regurg, paroxysmal A-fib, UTI.  Patient presents to the ED after receiving mail in the letter advising her to return for further treatment.  The patient was seen on 4/2 in this ED for low pelvic abdominal pain along with dysuria and right flank pain.  Patient also reported increasing episodes of urinary incontinence over the last several weeks when she was seen.  When the patient was discharged, she was discharged on short course of ciprofloxacin.  Patient was also prescribed Pyridium for pain.  Urine culture grew out ESBL and pharmacy recommends 3 g fosfomycin.  Patient states that she has had continued dysuria since being seen on 4/2 along with nausea.  Patient reports that nausea is "always there" and cannot give me a timetable as to when it began.  Patient also endorsing body aches however goes on to state that "I just hurt all over" and cannot give me a timeline as to when she began having body aches.  Patient overall nontoxic in appearance and denies any fevers, vomiting, diarrhea, lower pelvic pain.  HPI     Home Medications Prior to Admission medications   Medication Sig Start Date End Date Taking? Authorizing Provider  acetaminophen (TYLENOL) 650 MG CR tablet Take 650 mg by mouth every 8 (eight) hours as needed for pain.    [provider]  albuterol (PROVENTIL) (2.5 MG/3ML) 0.083% nebulizer solution Take 3 mLs (2.5 mg total) by nebulization every 6 (six) hours as needed for wheezing or shortness of breath. 01/09/22   Gabriel Earing, FNP  apixaban (ELIQUIS) 5 MG TABS tablet Take 1 tablet (5 mg total) by mouth 2 (two) times daily. Need to  schedule an appointment for further refills 08/21/20   Laurey Morale, MD  blood glucose meter kit and supplies 1 each by Other route 2 (two) times daily as needed for other. Dispense based on patient and insurance preference. . (FOR ICD-10 E10.9, E11.9).Dispense based on patient and insurance preference. 07/30/21   Junie Spencer, FNP  carvedilol (COREG) 12.5 MG tablet Take 1 tablet by mouth twice daily 08/20/21   Laurey Morale, MD  dapagliflozin propanediol (FARXIGA) 10 MG TABS tablet Take 1 tablet (10 mg total) by mouth daily. 09/19/21   Junie Spencer, FNP  furosemide (LASIX) 80 MG tablet Take 1 tablet (80 mg total) by mouth daily as needed. As needed for fluid (NEEDS TO BE SEEN BEFORE NEXT REFILL) 03/25/22   [name] A, FNP  glucose blood (GLUCOSE METER TEST) test strip Use BID 10/12/21   [name] A, FNP  hydrOXYzine (VISTARIL) 25 MG capsule TAKE 1 CAPSULE BY MOUTH THREE TIMES DAILY Patient taking differently: Take 25 mg by mouth daily. 07/12/21   Junie Spencer, FNP  levocetirizine (XYZAL) 5 MG tablet Take 1 tablet (5 mg total) by mouth every evening. 01/09/22   Gabriel Earing, FNP  levothyroxine (SYNTHROID) 88 MCG tablet TAKE 1 TABLET BY MOUTH ONCE DAILY BEFORE BREAKFAST 10/25/21   [name] A, FNP  metFORMIN (GLUCOPHAGE-XR) 750 MG 24 hr tablet Take 1 tablet (750 mg total) by mouth 2 (two) times daily. (NEEDS TO BE SEEN BEFORE NEXT  REFILL) 03/25/22   Junie Spencer, FNP  pantoprazole (PROTONIX) 40 MG tablet TAKE 1 TABLET BY MOUTH ONCE DAILY BEFORE BREAKFAST 08/20/21   Hawks, Neysa Bonito A, FNP  polyethylene glycol (MIRALAX) 17 g packet Take 17 g by mouth daily. 04/23/22   Burgess Amor, PA-C  potassium chloride SA (KLOR-CON M) 20 MEQ tablet Take 1 tablet (20 mEq total) by mouth as directed. Only take 1 tablet with extra doses of Lasix 12/04/21   Jacklynn Ganong, FNP  spironolactone (ALDACTONE) 25 MG tablet Take 1 tablet by mouth once daily 08/20/21   Bensimhon, Bevelyn Buckles, MD   Vericiguat 5 MG TABS Take 1 tablet (5 mg total) by mouth daily. 12/04/21   Milford, Anderson Malta, FNP      Allergies    Buspar [buspirone], Lexapro [escitalopram oxalate], Trulicity [dulaglutide], Bactrim, Cephalexin, Chlorhexidine gluconate, Clindamycin, Contrast media [iodinated contrast media], Crestor [rosuvastatin], Doxycycline, Latex, Levofloxacin, Nitrofurantoin monohyd macro, and Penicillins    Review of Systems   Review of Systems  Gastrointestinal:  Positive for nausea.  Genitourinary:  Positive for dysuria.  All other systems reviewed and are negative.   Physical Exam Updated Vital Signs BP 119/72 (BP Location: Right Arm)   Pulse 76   Temp 98.1 F (36.7 C) (Oral)   Resp 18   Ht 5' 2.5" (1.588 m)   Wt 79.8 kg   SpO2 98%   BMI 31.68 kg/m  Physical Exam Vitals and nursing note reviewed.  Constitutional:      General: She is not in acute distress.    Appearance: Normal appearance. She is not ill-appearing, toxic-appearing or diaphoretic.  HENT:     Head: Normocephalic and atraumatic.     Nose: Nose normal.     Mouth/Throat:     Mouth: Mucous membranes are moist.     Pharynx: Oropharynx is clear.  Eyes:     Extraocular Movements: Extraocular movements intact.     Conjunctiva/sclera: Conjunctivae normal.     Pupils: Pupils are equal, round, and reactive to light.  Cardiovascular:     Rate and Rhythm: Normal rate and regular rhythm.  Pulmonary:     Effort: Pulmonary effort is normal.     Breath sounds: Normal breath sounds. No wheezing.  Abdominal:     General: Abdomen is flat. Bowel sounds are normal.     Palpations: Abdomen is soft.     Tenderness: There is no abdominal tenderness.  Musculoskeletal:     Cervical back: Normal range of motion and neck supple. No tenderness.  Skin:    General: Skin is warm and dry.     Capillary Refill: Capillary refill takes less than 2 seconds.  Neurological:     Mental Status: She is alert and oriented to person, place, and  time.     ED Results / Procedures / Treatments   Labs (all labs ordered are listed, but only abnormal results are displayed) Labs Reviewed  URINALYSIS, ROUTINE W REFLEX MICROSCOPIC - Abnormal; Notable for the following components:      Result Value   APPearance HAZY (*)    Glucose, UA >=500 (*)    Leukocytes,Ua SMALL (*)    Bacteria, UA RARE (*)    All other components within normal limits  CBC - Abnormal; Notable for the following components:   Hemoglobin 10.3 (*)    HCT 34.2 (*)    MCV 78.3 (*)    MCH 23.6 (*)    RDW 18.1 (*)    All other components  within normal limits  BASIC METABOLIC PANEL - Abnormal; Notable for the following components:   Sodium 134 (*)    Chloride 91 (*)    Glucose, Bld 138 (*)    Creatinine, Ser 1.40 (*)    GFR, Estimated 39 (*)    All other components within normal limits  URINE CULTURE    EKG None  Radiology No results found.  Procedures Procedures   Medications Ordered in ED Medications  fosfomycin (MONUROL) packet 3 g (3 g Oral Given 05/04/22 1518)    ED Course/ Medical Decision Making/ A&P Clinical Course as of 05/04/22 1648  Sat May 04, 2022  1509 Fosfomycin  [CG]  1544 Waiting room [CG]    Clinical Course User Index [CG] Al Decant, PA-C    Medical Decision Making Amount and/or Complexity of Data Reviewed Labs: ordered.  Risk Prescription drug management.   77 year old female presents to the ED for evaluation/treatment.  Please see HPI for further details.  On examination patient afebrile and nontachycardic.  Lung sounds are clear bilaterally, she is not hypoxic.  Her abdomen is soft and compressible throughout.  Patient overall nontoxic in appearance with reassuring vital signs.  Due to continued dysuria, will collect repeat urinalysis, repeat labs.  Patient was provided 3 g fosfomycin here in the department.  CBC without leukocytosis and a baseline hemoglobin.  BMP with no extreme electrolyte  derangement, creatinine baseline.  Urinalysis shows glucose, small leukocytes however no obvious signs of infection.  Will culture the patient urine.  Patient provided fosfomycin here.  Patient case discussed with attending Dr. Hyacinth Meeker who voices agreement with plan of management.  Patient advised to follow-up with PCP for reevaluation.  Patient encouraged to return to the ED with any new or worsening signs or symptoms.  Patient stable for discharge.   Final Clinical Impression(s) / ED Diagnoses Final diagnoses:  Dysuria    Rx / DC Orders ED Discharge Orders     None         Clent Ridges 05/04/22 1648    Eber Hong, MD 05/06/22 2025

## 2022-05-04 NOTE — Discharge Instructions (Signed)
Please return to the ED with any new or worsening signs or symptoms such as fevers Please follow-up with your PCP for reevaluation Please read attached guide concerning dysuria

## 2022-05-04 NOTE — ED Triage Notes (Signed)
Patient states she received a letter in the mail instructing her to come to the hospital for IV antibiotics. Patient was seen on 4/2 and was diagnosed with a UTI. Patient describes abdominal pressure. A &O x 4, NAD.

## 2022-05-05 ENCOUNTER — Other Ambulatory Visit: Payer: Self-pay | Admitting: Family

## 2022-05-06 NOTE — Telephone Encounter (Signed)
I called pt & made appt on 06-04-2022 w/Hawks.

## 2022-05-06 NOTE — Telephone Encounter (Signed)
Hawks pt NTBS 30-d given 03/25/22

## 2022-05-07 LAB — URINE CULTURE: Culture: >=100000 — AB

## 2022-05-07 MED ORDER — PANTOPRAZOLE SODIUM 40 MG PO TBEC: 40.0000 mg | DELAYED_RELEASE_TABLET | Freq: Every day | ORAL | 0 refills | Status: DC

## 2022-05-07 MED ORDER — FUROSEMIDE 80 MG PO TABS: 80.0000 mg | ORAL_TABLET | Freq: Every day | ORAL | 0 refills | Status: DC | PRN

## 2022-05-07 MED ORDER — METFORMIN HCL ER 750 MG PO TB24: 750.0000 mg | ORAL_TABLET | Freq: Two times a day (BID) | ORAL | 0 refills | Status: DC

## 2022-05-07 NOTE — Addendum Note (Signed)
Addended by: Julious Payer D on: 05/07/2022 09:46 AM   Modules accepted: Orders

## 2022-05-08 ENCOUNTER — Telehealth: Payer: Self-pay

## 2022-05-08 NOTE — Telephone Encounter (Signed)
     Patient  visit on 05/04/2022  at [facility]  was for dysuria.  Have you been able to follow up with your primary care physician? No. Patient stated she will contact them if she is not feeling better.  The patient was or was not able to obtain any needed medicine or equipment. No medication prescribed.  Are there diet recommendations that you are having difficulty following? No  Patient expresses understanding of discharge instructions and education provided has no other needs at this time. Yes   Hamzah Savoca [facility]  [facility] Population Health Community Resource Care Guide   [email or web address]  ?? [phone]   Website: [email or web address]  [email or web address]

## 2022-05-08 NOTE — Telephone Encounter (Signed)
        Patient  visited [facility] on 05/04/2022  for Dysuria.   Telephone encounter attempt :  1st  A HIPAA compliant voice message was left requesting a return call.  Instructed patient to call back at [phone].   Dyane Broberg [facility]  [facility] Population Health Community Resource Care Guide   [email or web address]  ?? [phone]   Website: [email or web address]  [email or web address]

## 2022-05-09 ENCOUNTER — Telehealth (HOSPITAL_BASED_OUTPATIENT_CLINIC_OR_DEPARTMENT_OTHER): Payer: Self-pay | Admitting: *Deleted

## 2022-05-09 NOTE — Telephone Encounter (Signed)
Post ED Visit - Positive Culture Follow-up  Culture report reviewed by antimicrobial stewardship pharmacist: [facility] []  Enzo Bi, Pharm.D. []  Celedonio Miyamoto, Pharm.D., BCPS AQ-ID []  Garvin Fila, Pharm.D., BCPS []  Georgina Pillion, Pharm.D., BCPS []  [place], [address].D., BCPS, AAHIVP []  Estella Husk, Pharm.D., BCPS, AAHIVP []  Lysle Pearl, PharmD, BCPS []  Phillips Climes, PharmD, BCPS []  Agapito Games, PharmD, BCPS []  Verlan Friends, PharmD []  Mervyn Gay, PharmD, BCPS []  Vinnie Level, PharmD  [facility] []  Len Childs, PharmD []  Greer Pickerel, PharmD []  Adalberto Cole, PharmD []  Perlie Gold, Rph []  Lonell Face) Jean Rosenthal, PharmD []  Earl Many, PharmD []  Junita Push, PharmD []  Dorna Leitz, PharmD []  Terrilee Files, PharmD []  Lynann Beaver, PharmD []  Keturah Barre, PharmD []  Loralee Pacas, PharmD []  Bernadene Person, PharmD   Positive urine culture Treated with Fosfomycin given in ED, organism sensitive to the same and no further patient follow-up is required at this time.  Wilburn Cornelia, Pharm D  Virl Axe Climax 05/09/2022, 8:12 AM

## 2022-05-20 ENCOUNTER — Ambulatory Visit: Payer: Medicare Other | Attending: Cardiovascular Disease

## 2022-05-20 ENCOUNTER — Other Ambulatory Visit: Payer: Self-pay

## 2022-05-20 DIAGNOSIS — Z95810 Presence of automatic (implantable) cardiac defibrillator: Secondary | ICD-10-CM | POA: Diagnosis not present

## 2022-05-20 DIAGNOSIS — I5020 Unspecified systolic (congestive) heart failure: Secondary | ICD-10-CM

## 2022-05-20 DIAGNOSIS — D509 Iron deficiency anemia, unspecified: Secondary | ICD-10-CM

## 2022-05-24 NOTE — Progress Notes (Signed)
EPIC Encounter for ICM Monitoring  Patient Name: Melissa Paul is a 77 y.o. female Date: 05/24/2022 Primary Care Physican: Junie Spencer, FNP Primary Cardiologist: [place] Electrophysiologist: Mealor Bi-V Pacing: 90%        01/01/2022 Weight: 178 lbs 03/15/2022 Weight: 170 lbs 162 lbs   AT/AF Burden: 1.8% (taking Eliquis)         Spoke with patient and heart failure questions reviewed.  Transmission results reviewed.  Pt reports ED visit for UTI and COVID for past 3 weeks.  She has lost 14 lbs since having COVID and stays nauseated.  She is trying to increase fluid intake.     CorVue Thoracic impedance suggesting possible dryness starting 4/25 but returning to bsaeline 4/29.  Also possible dryness from 3/24-4/1.   Prescribed:  Furosemide 80 mg Take 80 mg by mouth daily as needed.  Pt takes extra if needed. Potassium 20 mEq take 1 tablet by mouth as directed.  Only take 1 tablet with extra doses of Lasix. Spirolactone 25 mg take 1 tablet by mouth daily Farxiga 5 mg take 1 tablet daily   Labs: 05/04/2022 Creatinine 1.40, BUN 23, Potassium 4.6, Sodium 134, GFR 39  04/23/2022 Creatinine 1.18, BUN 22, Potassium 3.4, Sodium 136, GFR 47  A complete set of results can be found in Results Review.   Recommendations:  No changes and encouraged to call if experiencing any fluid symptoms.   Follow-up plan: ICM clinic phone appointment on 06/24/2022.   91 day device clinic remote transmission 07/15/2022.    EP/Cardiology Office Visits:   Recall 04/03/2022.    08/23/2022 with Dr Nelly Laurence.   Copy of ICM check sent to Dr. Nelly Laurence.     3 month ICM trend: 05/20/2022.    12-14 Month ICM trend:     Karie Soda, RN 05/24/2022 4:30 PM

## 2022-05-28 NOTE — Progress Notes (Signed)
Remote ICD transmission.   

## 2022-06-04 ENCOUNTER — Ambulatory Visit (INDEPENDENT_AMBULATORY_CARE_PROVIDER_SITE_OTHER): Payer: Medicare Other | Admitting: Family

## 2022-06-04 ENCOUNTER — Encounter: Payer: Self-pay | Admitting: Family

## 2022-06-04 VITALS — BP 98/64 | HR 71 | Temp 98.0°F | Ht 62.5 in | Wt 170.6 lb

## 2022-06-04 DIAGNOSIS — I700 Atherosclerosis of aorta: Secondary | ICD-10-CM

## 2022-06-04 DIAGNOSIS — E1165 Type 2 diabetes mellitus with hyperglycemia: Secondary | ICD-10-CM | POA: Diagnosis not present

## 2022-06-04 DIAGNOSIS — E039 Hypothyroidism, unspecified: Secondary | ICD-10-CM

## 2022-06-04 DIAGNOSIS — M170 Bilateral primary osteoarthritis of knee: Secondary | ICD-10-CM

## 2022-06-04 DIAGNOSIS — K219 Gastro-esophageal reflux disease without esophagitis: Secondary | ICD-10-CM

## 2022-06-04 DIAGNOSIS — F411 Generalized anxiety disorder: Secondary | ICD-10-CM

## 2022-06-04 DIAGNOSIS — N1832 Chronic kidney disease, stage 3b: Secondary | ICD-10-CM

## 2022-06-04 DIAGNOSIS — J301 Allergic rhinitis due to pollen: Secondary | ICD-10-CM

## 2022-06-04 DIAGNOSIS — I5023 Acute on chronic systolic (congestive) heart failure: Secondary | ICD-10-CM | POA: Diagnosis not present

## 2022-06-04 DIAGNOSIS — I480 Paroxysmal atrial fibrillation: Secondary | ICD-10-CM

## 2022-06-04 LAB — BAYER DCA HB A1C WAIVED: HB A1C (BAYER DCA - WAIVED): 7.2 % — ABNORMAL HIGH (ref 4.8–5.6)

## 2022-06-04 NOTE — Patient Instructions (Signed)
Health Maintenance After Age 79 After age 79, you are at a higher risk for certain long-term diseases and infections as well as injuries from falls. Falls are a major cause of broken bones and head injuries in people who are older than age 79. Getting regular preventive care can help to keep you healthy and well. Preventive care includes getting regular testing and making lifestyle changes as recommended by your health care provider. Talk with your health care provider about: Which screenings and tests you should have. A screening is a test that checks for a disease when you have no symptoms. A diet and exercise plan that is right for you. What should I know about screenings and tests to prevent falls? Screening and testing are the best ways to find a health problem early. Early diagnosis and treatment give you the best chance of managing medical conditions that are common after age 79. Certain conditions and lifestyle choices may make you more likely to have a fall. Your health care provider may recommend: Regular vision checks. Poor vision and conditions such as cataracts can make you more likely to have a fall. If you wear glasses, make sure to get your prescription updated if your vision changes. Medicine review. Work with your health care provider to regularly review all of the medicines you are taking, including over-the-counter medicines. Ask your health care provider about any side effects that may make you more likely to have a fall. Tell your health care provider if any medicines that you take make you feel dizzy or sleepy. Strength and balance checks. Your health care provider may recommend certain tests to check your strength and balance while standing, walking, or changing positions. Foot health exam. Foot pain and numbness, as well as not wearing proper footwear, can make you more likely to have a fall. Screenings, including: Osteoporosis screening. Osteoporosis is a condition that causes  the bones to get weaker and break more easily. Blood pressure screening. Blood pressure changes and medicines to control blood pressure can make you feel dizzy. Depression screening. You may be more likely to have a fall if you have a fear of falling, feel depressed, or feel unable to do activities that you used to do. Alcohol use screening. Using too much alcohol can affect your balance and may make you more likely to have a fall. Follow these instructions at home: Lifestyle Do not drink alcohol if: Your health care provider tells you not to drink. If you drink alcohol: Limit how much you have to: 0-1 drink a day for women. 0-2 drinks a day for men. Know how much alcohol is in your drink. In the [place] one drink equals one 12 oz bottle of beer (355 mL), one 5 oz glass of wine (148 mL), or one 1 oz glass of hard liquor (44 mL). Do not use any products that contain nicotine or tobacco. These products include cigarettes, chewing tobacco, and vaping devices, such as e-cigarettes. If you need help quitting, ask your health care provider. Activity  Follow a regular exercise program to stay fit. This will help you maintain your balance. Ask your health care provider what types of exercise are appropriate for you. If you need a cane or walker, use it as recommended by your health care provider. Wear supportive shoes that have nonskid soles. Safety  Remove any tripping hazards, such as rugs, cords, and clutter. Install safety equipment such as grab bars in bathrooms and safety rails on stairs. Keep rooms and walkways   well-lit. General instructions Talk with your health care provider about your risks for falling. Tell your health care provider if: You fall. Be sure to tell your health care provider about all falls, even ones that seem minor. You feel dizzy, tiredness (fatigue), or off-balance. Take over-the-counter and prescription medicines only as told by your health care provider. These include  supplements. Eat a healthy diet and maintain a healthy weight. A healthy diet includes low-fat dairy products, low-fat (lean) meats, and fiber from whole grains, beans, and lots of fruits and vegetables. Stay current with your vaccines. Schedule regular health, dental, and eye exams. Summary Having a healthy lifestyle and getting preventive care can help to protect your health and wellness after age 79. Screening and testing are the best way to find a health problem early and help you avoid having a fall. Early diagnosis and treatment give you the best chance for managing medical conditions that are more common for people who are older than age 79. Falls are a major cause of broken bones and head injuries in people who are older than age 79. Take precautions to prevent a fall at home. Work with your health care provider to learn what changes you can make to improve your health and wellness and to prevent falls. This information is not intended to replace advice given to you by your health care provider. Make sure you discuss any questions you have with your health care provider. Document Revised: 05/29/2020 Document Reviewed: 05/29/2020 Elsevier Patient Education  2023 Elsevier Inc.  

## 2022-06-04 NOTE — Progress Notes (Addendum)
Subjective:    Patient ID: Melissa Paul, female    DOB: Jan 22, 1946, 77 y.o.   MRN: [id number]  Chief Complaint  Patient presents with   Medication Refill   Pt presents to the office today for chronic follow up. She is followed by Cardiologists for A Fib, CHF, and cardiomyopathy. And has a pacemaker. She is followed by CHF clinic and weighs daily. She takes Eliquis 5 mg BID for A FIB.   She had a right heart cath on 10/27/20. She reports she is doing well.    Complaining of neuropathy in bilateral legs.    Has CKD and avoids NSAID's.   She has an appointment to establish with Urologists on 06/13/22 for frequent UTI's.  Congestive Heart Failure Presents for follow-up visit. Associated symptoms include edema and fatigue. The symptoms have been stable.  Diabetes She presents for her follow-up diabetic visit. She has type 2 diabetes mellitus. Hypoglycemia symptoms include nervousness/anxiousness. Associated symptoms include blurred vision, fatigue and foot paresthesias. Diabetic complications include peripheral neuropathy. Risk factors for coronary artery disease include diabetes mellitus, dyslipidemia, hypertension, sedentary lifestyle and post-menopausal. She is following a generally healthy diet. Her overall blood glucose range is 110-130 mg/dl. Eye exam is current.  Gastroesophageal Reflux She complains of belching and heartburn. This is a chronic problem. The current episode started more than 1 year ago. The problem occurs occasionally. Associated symptoms include fatigue. Risk factors include obesity. She has tried a PPI for the symptoms. The treatment provided moderate relief.  Thyroid Problem Presents for follow-up visit. Symptoms include anxiety and fatigue. Patient reports no constipation or dry skin. The symptoms have been stable.  Arthritis Presents for follow-up visit. She complains of pain and stiffness. Affected locations include the left knee, right knee, left MCP and right  MCP. Her pain is at a severity of 10/10. Associated symptoms include fatigue.  Anxiety Presents for follow-up visit. Symptoms include excessive worry, irritability, nervous/anxious behavior and restlessness. Symptoms occur occasionally. The severity of symptoms is moderate.        Review of Systems  Constitutional:  Positive for fatigue and irritability.  Eyes:  Positive for blurred vision.  Gastrointestinal:  Positive for heartburn. Negative for constipation.  Musculoskeletal:  Positive for arthritis and stiffness.  Psychiatric/Behavioral:  The patient is nervous/anxious.   All other systems reviewed and are negative.      Objective:   Physical Exam Vitals reviewed.  Constitutional:      General: She is not in acute distress.    Appearance: She is well-developed.  HENT:     Head: Normocephalic and atraumatic.     Right Ear: Tympanic membrane normal.     Left Ear: Tympanic membrane normal.  Eyes:     Pupils: Pupils are equal, round, and reactive to light.  Neck:     Thyroid: No thyromegaly.  Cardiovascular:     Rate and Rhythm: Normal rate and regular rhythm.     Heart sounds: Normal heart sounds. No murmur heard. Pulmonary:     Effort: Pulmonary effort is normal. No respiratory distress.     Breath sounds: Normal breath sounds. No wheezing.  Abdominal:     General: Bowel sounds are normal. There is no distension.     Palpations: Abdomen is soft.     Tenderness: There is no abdominal tenderness.  Musculoskeletal:        General: No tenderness. Normal range of motion.     Cervical back: Normal range of motion and neck  supple.  Skin:    General: Skin is warm and dry.  Neurological:     Mental Status: She is alert and oriented to person, place, and time.     Cranial Nerves: No cranial nerve deficit.     Motor: Weakness (using rolling walker) present.     Gait: Gait abnormal.     Deep Tendon Reflexes: Reflexes are normal and symmetric.  Psychiatric:        Behavior:  Behavior normal.        Thought Content: Thought content normal.        Judgment: Judgment normal.       BP 98/64   Pulse 71   Temp 98 F (36.7 C) (Temporal)   Ht 5' 2.5" (1.588 m)   Wt 170 lb 9.6 oz (77.4 kg)   SpO2 99%   BMI 30.71 kg/m      Assessment & Plan:   Melissa Paul comes in today with chief complaint of Medication Refill   Diagnosis and orders addressed:  1. Acute on chronic systolic ACC/AHA stage C congestive heart failure (HCC)  2. Atherosclerosis of aorta (HCC)  3. Paroxysmal atrial fibrillation (HCC)  4. Bilateral primary osteoarthritis of knee  5. GAD (generalized anxiety disorder)  6. Gastroesophageal reflux disease, unspecified whether esophagitis present  7. Hypothyroidism, unspecified type  8. Type 2 diabetes mellitus with hyperglycemia, without long-term current use of insulin (HCC) - Microalbumin / creatinine urine ratio  9. Stage 3b chronic kidney disease (HCC)  10. Allergic rhinitis due to pollen, unspecified seasonality   Labs pending  Health Maintenance reviewed Diet and exercise encouraged  Follow up plan: 3 months    [name], FNP

## 2022-06-05 LAB — CMP14+EGFR
ALT: 11 IU/L (ref 0–32)
AST: 11 IU/L (ref 0–40)
Albumin/Globulin Ratio: 1.5 (ref 1.2–2.2)
Albumin: 4.2 g/dL (ref 3.8–4.8)
Alkaline Phosphatase: 78 IU/L (ref 44–121)
BUN/Creatinine Ratio: 13 (ref 12–28)
BUN: 18 mg/dL (ref 8–27)
Bilirubin Total: 0.4 mg/dL (ref 0.0–1.2)
CO2: 26 mmol/L (ref 20–29)
Calcium: 8.8 mg/dL (ref 8.7–10.3)
Chloride: 97 mmol/L (ref 96–106)
Creatinine, Ser: 1.37 mg/dL — ABNORMAL HIGH (ref 0.57–1.00)
Globulin, Total: 2.8 g/dL (ref 1.5–4.5)
Glucose: 125 mg/dL — ABNORMAL HIGH (ref 70–99)
Potassium: 4.5 mmol/L (ref 3.5–5.2)
Sodium: 139 mmol/L (ref 134–144)
Total Protein: 7 g/dL (ref 6.0–8.5)
eGFR: 40 mL/min/{1.73_m2} — ABNORMAL LOW (ref >59)

## 2022-06-13 ENCOUNTER — Encounter: Payer: Self-pay | Admitting: Urology

## 2022-06-13 ENCOUNTER — Ambulatory Visit (INDEPENDENT_AMBULATORY_CARE_PROVIDER_SITE_OTHER): Payer: Medicare Other | Admitting: Urology

## 2022-06-13 VITALS — BP 91/54 | HR 69

## 2022-06-13 DIAGNOSIS — Z889 Allergy status to unspecified drugs, medicaments and biological substances status: Secondary | ICD-10-CM

## 2022-06-13 DIAGNOSIS — N952 Postmenopausal atrophic vaginitis: Secondary | ICD-10-CM

## 2022-06-13 DIAGNOSIS — N3946 Mixed incontinence: Secondary | ICD-10-CM | POA: Diagnosis not present

## 2022-06-13 DIAGNOSIS — N993 Prolapse of vaginal vault after hysterectomy: Secondary | ICD-10-CM

## 2022-06-13 DIAGNOSIS — N390 Urinary tract infection, site not specified: Secondary | ICD-10-CM | POA: Diagnosis not present

## 2022-06-13 LAB — URINALYSIS, ROUTINE W REFLEX MICROSCOPIC
Bilirubin, UA: NEGATIVE
Ketones, UA: NEGATIVE
Leukocytes,UA: NEGATIVE
Nitrite, UA: NEGATIVE
RBC, UA: NEGATIVE
Specific Gravity, UA: 1.02 (ref 1.005–1.030)
Urobilinogen, Ur: 1 mg/dL (ref 0.2–1.0)
pH, UA: 6 (ref 5.0–7.5)

## 2022-06-13 LAB — BLADDER SCAN AMB NON-IMAGING

## 2022-06-13 MED ORDER — FOSFOMYCIN TROMETHAMINE 3 G PO PACK: PACK | ORAL | 11 refills | Status: DC

## 2022-06-13 MED ORDER — ESTRADIOL 0.1 MG/GM VA CREA: TOPICAL_CREAM | VAGINAL | 12 refills | Status: DC

## 2022-06-13 NOTE — Progress Notes (Signed)
Pt is prepped for and in and out catherization. Patient was cleaned and prepped in a sterle fashion with betadine. A 14 fr catheter foley was inserted. Urine return was note 60 ml.  Performed by Guss Bunde, CMA  Urine sent for ua

## 2022-06-13 NOTE — Progress Notes (Signed)
Subjective: 1. Recurrent UTI   2. Multiple drug allergies   3. Vaginal atrophy   4. Mixed incontinence urge and stress   5. Vaginal vault prolapse after hysterectomy      Consult requested by Melissa Rodney NP.  Melissa Paul is a 77 yo female who is sent for recurrent UTI's with ESBL e. Coli on a culture in April with Mx antibiotic allergies including sulfa, PCN, keflex, Nitrofurantoin, levaquin and doxycycline.   She had a CT stone study on 04/23/22.  She has stable minimally complex bilateral renal cysts.  She was most recently treated with fosfomycin on 05/04/22 and had Cipro on 04/23/22 but was resistant.  She has bladder pressure and urgency and she has to stand and bend over to start her stream.  Her PVR is only 33ml.  She has prolapse and had a pessary but was unable to retain it.  She has been on topical estrogen but is off of it now.  She has no history of stones. She has had no GU surgery.  She has no pneumoturia or fecaluria.  She has had no hematuria.  She has had pyelonephritis in the past.  She has malodorous urine.  She wears pads for MUI.    ROS:  Review of Systems  Constitutional:  Positive for malaise/fatigue.  Respiratory:  Positive for cough and shortness of breath.   Cardiovascular:  Positive for leg swelling.  Gastrointestinal:  Positive for heartburn.  Musculoskeletal:  Positive for joint pain.  Skin:  Positive for itching.  Neurological:  Positive for dizziness and weakness.  All other systems reviewed and are negative.   Allergies  Allergen Reactions   Buspar [Buspirone] Other (See Comments)    Patient states that she became hyper and had lots of itching.   Lexapro [Escitalopram Oxalate] Other (See Comments)    Patient states that she became hyper and lots of itching   Trulicity [Dulaglutide] Hives and Itching    Tongue was numb.   Bactrim Rash   Cephalexin Swelling and Rash    Says she is able to take cefuroxime Throat swelling   Chlorhexidine Gluconate Hives and  Itching    Hives, itching after using wipes with last procedure   Clindamycin Rash        Contrast Media [Iodinated Contrast Media] Rash   Crestor [Rosuvastatin] Hives and Rash   Doxycycline Rash   Latex Rash        Levofloxacin Rash        Nitrofurantoin Monohyd Macro Rash        Penicillins Swelling and Rash    Has patient had a PCN reaction causing immediate rash, facial/tongue/throat swelling, SOB or lightheadedness with hypotension: Yes Has patient had a PCN reaction causing severe rash involving mucus membranes or skin necrosis: Yes Has patient had a PCN reaction that required hospitalization: No Has patient had a PCN reaction occurring within the last 10 years: No Rash/swelled tongue If all of the above answers are "NO", then may proceed with Cephalosporin use.     Past Medical History:  Diagnosis Date   Anxiety    Chronic systolic heart failure (HCC)    Constipation    Cyst of right kidney 11/12/2017   Glucose intolerance (pre-diabetes)    Hyperlipemia    Hypothyroidism    LBBB (left bundle branch block)    Mitral regurgitation    Mild to Moderate   Nonischemic cardiomyopathy (HCC)    LVEF 35-40% by echo 8/13   Paroxysmal atrial fibrillation (  HCC)    Brief episodes by device interrogation   UTI (urinary tract infection)    Recurrent   Ventricular tachycardia (HCC)    ICD therapy for VT    Past Surgical History:  Procedure Laterality Date   APPENDECTOMY     BIV ICD GENERATOR CHANGEOUT N/A 07/11/2016   Procedure: BiV ICD Generator Changeout;  Surgeon: Melissa Range, MD;  Location: [facility] INVASIVE CV LAB;  Service: Cardiovascular;  Laterality: N/A;   CARDIAC DEFIBRILLATOR PLACEMENT  09/2009   St. Jude BiV ICD by Dr. Shanna Paul class III   CHOLECYSTECTOMY     has one ovary left     KNEE ARTHROSCOPY     Left   LEFT AND RIGHT HEART CATHETERIZATION WITH CORONARY ANGIOGRAM N/A 04/12/2014   Procedure: LEFT AND RIGHT HEART CATHETERIZATION WITH CORONARY ANGIOGRAM;   Surgeon: Melissa Lex, MD;  Location: [facility] CATH LAB;  Service: Cardiovascular;  Laterality: N/A;   Left breast biopsy x 2     no maliganancy   PACEMAKER IMPLANT     RIGHT HEART CATH N/A 10/27/2020   Procedure: RIGHT HEART CATH;  Surgeon: Melissa Morale, MD;  Location: [facility] INVASIVE CV LAB;  Service: Cardiovascular;  Laterality: N/A;   RIGHT/LEFT HEART CATH AND CORONARY ANGIOGRAPHY N/A 03/26/2019   Procedure: RIGHT/LEFT HEART CATH AND CORONARY ANGIOGRAPHY;  Surgeon: Melissa Morale, MD;  Location: [facility] INVASIVE CV LAB;  Service: Cardiovascular;  Laterality: N/A;   TOTAL ABDOMINAL HYSTERECTOMY     (R) ovary removed    Social History   Socioeconomic History   Marital status: Widowed    Spouse name: Not on file   Number of children: 2   Years of education: Not on file   Highest education level: Not on file  Occupational History   Not on file  Tobacco Use   Smoking status: Former    Packs/day: 0.30    Years: 20.00    Additional pack years: 0.00    Total pack years: 6.00    Types: Cigarettes    Start date: 08/03/1949    Quit date: 01/22/1996    Years since quitting: 26.4    Passive exposure: Never   Smokeless tobacco: Never  Vaping Use   Vaping Use: Never used  Substance and Sexual Activity   Alcohol use: No    Alcohol/week: 0.0 standard drinks of alcohol   Drug use: No   Sexual activity: Not Currently    Birth control/protection: Surgical    Comment: hyst  Other Topics Concern   Not on file  Social History Narrative   Her daughter, Melissa Paul lives with her - takes her to all visits and cooks for her.   Social Determinants of Health   Financial Resource Strain: Low Risk  (07/18/2021)   Overall Financial Resource Strain (CARDIA)    Difficulty of Paying Living Expenses: Not hard at all  Food Insecurity: No Food Insecurity (07/18/2021)   Hunger Vital Sign    Worried About Running Out of Food in the Last Year: Never true    Ran Out of Food in the Last Year: Never true   Transportation Needs: No Transportation Needs (07/18/2021)   PRAPARE - Administrator, Civil Service (Medical): No    Lack of Transportation (Non-Medical): No  Physical Activity: Sufficiently Active (07/18/2021)   Exercise Vital Sign    Days of Exercise per Week: 5 days    Minutes of Exercise per Session: 30 min  Stress: No Stress Concern Present (07/18/2021)  [employer] of Occupational Health - Occupational Stress Questionnaire    Feeling of Stress : Not at all  Social Connections: Socially Integrated (07/18/2021)   Social Connection and Isolation Panel [NHANES]    Frequency of Communication with Friends and Family: More than three times a week    Frequency of Social Gatherings with Friends and Family: More than three times a week    Attends Religious Services: More than 4 times per year    Active Member of [employer] or Organizations: Yes    Attends Engineer, structural: More than 4 times per year    Marital Status: Married  Catering manager Violence: Not At Risk (07/18/2021)   Humiliation, Afraid, Rape, and Kick questionnaire    Fear of Current or Ex-Partner: No    Emotionally Abused: No    Physically Abused: No    Sexually Abused: No    Family History  Problem Relation Age of Onset   CVA Mother        multiple   Heart Problems Mother        "weak heart"   Heart disease Maternal Grandmother    Heart disease Maternal Grandfather    Cancer Father        Stomach and eshpogus   Diabetes Neg Hx    Hypertension Neg Hx    Coronary artery disease Neg Hx     Anti-infectives: Anti-infectives (From admission, onward)    Start     Dose/Rate Route Frequency Ordered Stop   06/13/22 0000  fosfomycin (MONUROL) 3 g PACK       Note to Pharmacy: She is needs 1 packet every 10 days, #3 with 11 refills.      06/13/22 1408         Current Outpatient Medications  Medication Sig Dispense Refill   acetaminophen (TYLENOL) 650 MG CR tablet Take 650 mg by mouth every  8 (eight) hours as needed for pain.     albuterol (PROVENTIL) (2.5 MG/3ML) 0.083% nebulizer solution Take 3 mLs (2.5 mg total) by nebulization every 6 (six) hours as needed for wheezing or shortness of breath. 150 mL 1   apixaban (ELIQUIS) 5 MG TABS tablet Take 1 tablet (5 mg total) by mouth 2 (two) times daily. Need to schedule an appointment for further refills 180 tablet 0   blood glucose meter kit and supplies 1 each by Other route 2 (two) times daily as needed for other. Dispense based on patient and insurance preference. . (FOR ICD-10 E10.9, E11.9).Dispense based on patient and insurance preference. 1 each 0   carvedilol (COREG) 12.5 MG tablet Take 1 tablet by mouth twice daily 180 tablet 3   dapagliflozin propanediol (FARXIGA) 10 MG TABS tablet Take 1 tablet (10 mg total) by mouth daily. 90 tablet 6   estradiol (ESTRACE) 0.1 MG/GM vaginal cream 0.5gm vaginally at bedtime 2-3x weekly. 42.5 g 12   fosfomycin (MONUROL) 3 g PACK Take 3gm po every 10 days. 3 each 11   furosemide (LASIX) 80 MG tablet Take 1 tablet (80 mg total) by mouth daily as needed. As needed for fluid 30 tablet 0   glucose blood (GLUCOSE METER TEST) test strip Use BID 100 each 12   hydrOXYzine (VISTARIL) 25 MG capsule TAKE 1 CAPSULE BY MOUTH THREE TIMES DAILY (Patient taking differently: Take 25 mg by mouth daily.) 90 capsule 1   levocetirizine (XYZAL) 5 MG tablet Take 1 tablet (5 mg total) by mouth every evening. 20 tablet 0   levothyroxine (  SYNTHROID) 88 MCG tablet TAKE 1 TABLET BY MOUTH ONCE DAILY BEFORE BREAKFAST 90 tablet 3   metFORMIN (GLUCOPHAGE-XR) 750 MG 24 hr tablet Take 1 tablet (750 mg total) by mouth 2 (two) times daily. 60 tablet 0   pantoprazole (PROTONIX) 40 MG tablet Take 1 tablet (40 mg total) by mouth daily before breakfast. 30 tablet 0   polyethylene glycol (MIRALAX) 17 g packet Take 17 g by mouth daily. 14 each 0   potassium chloride SA (KLOR-CON M) 20 MEQ tablet Take 1 tablet (20 mEq total) by mouth as  directed. Only take 1 tablet with extra doses of Lasix 30 tablet 3   spironolactone (ALDACTONE) 25 MG tablet Take 1 tablet by mouth once daily 90 tablet 11   Vericiguat 5 MG TABS Take 1 tablet (5 mg total) by mouth daily. 90 tablet 3   No current facility-administered medications for this visit.     Objective: Vital signs in last 24 hours: BP (!) 91/54   Pulse 69   Intake/Output from previous day: No intake/output data recorded. Intake/Output this shift: @IOTHISSHIFT @   Physical Exam Vitals reviewed.  Constitutional:      Appearance: Normal appearance.  Cardiovascular:     Rate and Rhythm: Normal rate and regular rhythm.  Pulmonary:     Effort: Pulmonary effort is normal.     Breath sounds: Normal breath sounds.  Abdominal:     General: Abdomen is flat.     Palpations: Abdomen is soft. There is no mass.     Tenderness: There is no abdominal tenderness.  Genitourinary:    Comments: Nl external genitalia. Mild stenosis. Moderate vaginal atrophy.   Normal meatus. Minimal hypermobility. G2 apical prolapse without rectocele or significant cystocele. Bladder non-tender without mass. S/p hysterectomy.  Musculoskeletal:        General: Swelling present. Normal Paul of motion.  Skin:    General: Skin is warm and dry.  Neurological:     General: No focal deficit present.     Mental Status: She is alert and oriented to person, place, and time.  Psychiatric:        Mood and Affect: Mood normal.        Behavior: Behavior normal.     Lab Results:  Results for orders placed or performed in visit on 06/13/22 (from the past 24 hour(s))  Urinalysis, Routine w reflex microscopic     Status: Abnormal   Collection Time: 06/13/22  1:39 PM  Result Value Ref Paul   Specific Gravity, UA 1.020 1.005 - 1.030   pH, UA 6.0 5.0 - 7.5   Color, UA Yellow Yellow   Appearance Ur Clear Clear   Leukocytes,UA Negative Negative   Protein,UA Trace Negative/Trace   Glucose, UA 3+ (A)  Negative   Ketones, UA Negative Negative   RBC, UA Negative Negative   Bilirubin, UA Negative Negative   Urobilinogen, Ur 1.0 0.2 - 1.0 mg/dL   Nitrite, UA Negative Negative   Microscopic Examination Comment    Narrative   Performed at:  [address] [facility] [address] [place], [place]  [id number] Lab Director: Chinita Pester MT, Phone:  [phone]   *Note: Due to a large number of results and/or encounters for the requested time period, some results have not been displayed. A complete set of results can be found in Results Review.    BMET No results for input(s): "NA", "K", "CL", "CO2", "GLUCOSE", "BUN", "CREATININE", "CALCIUM" in the last 72 hours. PT/INR No results  for input(s): "LABPROT", "INR" in the last 72 hours. ABG No results for input(s): "PHART", "HCO3" in the last 72 hours.  Invalid input(s): "PCO2", "PO2"   Organism ID, Bacteria Urine Culture (for pregnant, neutropenic or urologic patients or patients with an indwelling urinary catheter) Collected: 05/04/22 1545  Result status: Final  Resulting lab: [facility]  Value: ESCHERICHIA COLI Abnormal     Contains abnormal data Urine Culture (for pregnant, neutropenic or urologic patients or patients with an indwelling urinary catheter) Order: [id number] Status: Final result     Visible to patient: No (inaccessible in MyChart)     Next appt: Today at 01:00 PM in Urology Bjorn Pippin, MD)   Specimen Information: Urine, Clean Catch  0 Result Notes    Component 1 mo ago  Specimen Description URINE, CLEAN CATCH Performed at [facility], [address], [place], [place] [address]  Special Requests NONE Performed at [facility], [address] [place], [place] [address]  Culture  Abnormal  >=100,000 COLONIES/mL ESCHERICHIA COLI Confirmed Extended Spectrum Beta-Lactamase Producer (ESBL).  In bloodstream infections from ESBL organisms, carbapenems are preferred over piperacillin/tazobactam.  They are shown to have a lower risk of mortality.   Report Status 05/07/2022 FINAL  Organism ID, Bacteria ESCHERICHIA COLI Abnormal   Resulting Agency [facility]     Susceptibility   Escherichia coli    MIC    AMPICILLIN >=32 RESIST... Resistant    AMPICILLIN/SULBACTAM 4 SENSITIVE Sensitive    CEFAZOLIN >=64 RESIST... Resistant    CEFEPIME 16 RESISTANT Resistant    CEFTRIAXONE >=64 RESIST... Resistant    CIPROFLOXACIN >=4 RESISTANT Resistant    GENTAMICIN <=1 SENSITIVE Sensitive    IMIPENEM <=0.25 SENS... Sensitive    NITROFURANTOIN <=16 SENSIT... Sensitive    PIP/TAZO <=4 SENSITIVE Sensitive    TRIMETH/SULFA >=320 RESIS... Resistant           Susceptibility Comments  Escherichia coli  >=100,000 COLONIES/mL ESCHERICHIA COLI      Specimen Collected: 05/04/22 15:45 Last Resulted: 05/07/22 10:02      Lab Flowsheet      Order Details      View Encounter      Lab and Collection Details      Routing      Result History    View All Conversations on this Encounter        Result Care Coordination   Patient Communication   Add Comments   Not seen Back to Top      Other Results from 05/04/2022   Contains abnormal data Urinalysis, Routine w reflex microscopic -Urine, Clean Catch Order: [id number] Status: Final result      Visible to patient: No (inaccessible in MyChart)      Next appt: Today at 01:00 PM in Urology Bjorn Pippin, MD)    0 Result Notes            Component Ref Paul & Units 1 mo ago (05/04/22) 1 mo ago (04/23/22) 2 mo ago (04/04/22) 2 mo ago (04/04/22) 1 yr ago (03/30/21) 1 yr ago (03/09/21) 1 yr ago (03/09/21)  Color, Urine YELLOW YELLOW YELLOW       APPearance CLEAR HAZY Abnormal  HAZY Abnormal        Specific Gravity, Urine 1.005 - 1.030 1.017 1.013       pH 5.0 - 8.0 6.0 5.0       Glucose, UA NEGATIVE mg/dL >=865 Abnormal  >=784 Abnormal   3+ Abnormal  R 3+ Abnormal  R  3+ Abnormal  R  Hgb urine dipstick NEGATIVE NEGATIVE NEGATIVE        Bilirubin Urine NEGATIVE NEGATIVE NEGATIVE       Ketones, ur NEGATIVE mg/dL NEGATIVE NEGATIVE       Protein, ur NEGATIVE mg/dL NEGATIVE NEGATIVE       Nitrite NEGATIVE NEGATIVE NEGATIVE       Leukocytes,Ua NEGATIVE SMALL Abnormal  SMALL Abnormal        RBC / HPF 0 - 5 RBC/hpf 0-5 0-5       WBC, UA 0 - 5 WBC/hpf 21-50 11-20 6-10 Abnormal  R   11-30 Abnormal  R   Bacteria, UA NONE SEEN RARE Abnormal  FEW Abnormal  Moderate Abnormal  R   Moderate Abnormal  R   Squamous Epithelial / HPF 0 - 5 /HPF 6-10 0-5       WBC Clumps PRESENT        Amorphous Crystal PRESENT        Comment: Performed at [facility], [address], [place], [place] [address]  Mucus  PRESENT CM       Protein,UA    Negative R Negative R  Trace Abnormal  R  Ketones, UA    Trace Abnormal  R Negative R  Trace Abnormal  R  RBC, UA    Trace Abnormal  R Negative R  Negative R  Bilirubin, UA    Negative R Negative R  Negative R  Urobilinogen, Ur    0.2 R 0.2 R  0.2 R  Nitrite, UA    Positive Abnormal  R Negative R  Negative R  Microscopic Examination    See below:   See below:  RBC, Urine   None seen R   None seen R   Epithelial Cells (non renal)   0-10 R   0-10 R   Renal Epithel, UA   None seen R   None seen R   Specific Gravity, UA    1.015 R 1.010 R  1.015 R  pH, UA    6.0 R 5.5 R  5.0 R  Color, UA    Yellow R Yellow R  Yellow R  Appearance Ur    Cloudy Abnormal  R Cloudy Abnormal  R  Clear R  Leukocytes,UA    Negative R Trace Abnormal  R  Trace Abnormal  R  [facility]ing Agency [facility]         Specimen Collected: 05/04/22 15:45 Last Resulted: 05/04/22 16:13      Lab Flowsheet       Order Details       View Encounter       Lab and Collection Details       Routing       Result History     View All Conversations on this Encounter      CM=Additional comments  R=Reference Paul differs from displayed Paul      Result Care  Coordination   Patient Communication   Add Comments   Not seen Back to Top         Contains abnormal data CBC Order: [id number] Status: Final result      Visible to patient: No (inaccessible in MyChart)      Next appt: Today at 01:00 PM in Urology Bjorn Pippin, MD)    0 Result Notes            Component Ref Paul &  Units 1 mo ago (05/04/22) 1 mo ago (04/23/22) 4 mo ago (01/24/22) 8 mo ago (10/12/21) 1 yr ago (03/29/21) 1 yr ago (03/09/21) 1 yr ago (10/27/20)  WBC 4.0 - 10.5 K/uL 8.3 6.0 6.3 R 6.4 R 6.5 R 7.8 R   RBC 3.87 - 5.11 MIL/uL 4.37 4.11 4.00 R 3.86 R 4.13 R 4.55 R   Hemoglobin 12.0 - 15.0 g/dL 91.4 Low  9.9 Low  9.5 Low  R 9.2 Low  R 10.2 Low  R 11.6 R 10.9 Low   HCT 36.0 - 46.0 % 34.2 Low  32.9 Low  30.6 Low  R 28.6 Low  R 32.6 Low  R 35.4 R 32.0 Low   MCV 80.0 - 100.0 fL 78.3 Low  80.0 77 Low  R 74 Low  R 79 R 78 Low  R   MCH 26.0 - 34.0 pg 23.6 Low  24.1 Low  23.8 Low  R 23.8 Low  R 24.7 Low  R 25.5 Low  R   MCHC 30.0 - 36.0 g/dL 78.2 95.6 21.3 Low  R 08.6 R 31.3 Low  R 32.8 R   RDW 11.5 - 15.5 % 18.1 High  17.9 High  16.1 High  R 16.2 High  R 15.6 High  R 14.8 R   Platelets 150 - 400 K/uL 254 256 284 R 253 R 271 R 331 R   nRBC 0.0 - 0.2 % 0.0 0.0 CM       Comment: Performed at [facility], [address] [place], [place] [address]  Neutrophils   83 R 52 R 54 R 57 R   Basophils Absolute   0.0 R 0.0 R 0.0 R 0.0 R   Immature Granulocytes   1 R 1 R 1 R 1 R   Immature Grans (Abs)   0.1 R 0.0 R 0.0 R 0.1 R   pH, Ven       7.433 High  R  pCO2, Ven       45.0 R  pO2, Ven       38.0 R  Bicarbonate       30.1 High  R  TCO2       31 R  O2 Saturation       74.0 R  Acid-Base Excess       5.0 High  R  Lymphs   12 R 37 R 33 R 33 R   Sodium       141 R  Potassium       3.2 Low  R  Calcium, Ion       1.12 Low  R  Sample type       VENOUS  Monocytes   4 R 8 R 11 R 8 R   Eos   0 R 1 R 1 R 0 R   Basos   0 R 1 R 0 R 1 R   Neutrophils Absolute   5.2 R 3.3 R 3.5 R  4.5 R   Lymphocytes Absolute   0.8 R 2.4 R 2.1 R 2.6 R   Monocytes Absolute   0.2 R 0.5 R 0.7 R 0.6 R   EOS (ABSOLUTE)   0.0 R 0.1 R 0.0 R 0.0 R   [facility]ing Agency [facility]         Specimen Collected: 05/04/22 15:39 Last Resulted: 05/04/22 15:43      Lab Flowsheet  Order Clinical research associate History     View All Conversations on this Encounter      CM=Additional comments  R=Reference Paul differs from displayed Paul      Result Care Coordination   Patient Communication   Add Comments   Not seen Back to Top         Contains abnormal data Basic metabolic panel Order: [id number] Status: Final result      Visible to patient: No (inaccessible in MyChart)      Next appt: Today at 01:00 PM in Urology Bjorn Pippin, MD)    0 Result Notes      1 HM Topic    important suggestion  Newer results are available. Click to view them now.             Component Ref Paul & Units 1 mo ago (05/04/22) 1 mo ago (04/23/22) 6 mo ago (12/04/21) 8 mo ago (10/12/21) 1 yr ago (03/29/21) 1 yr ago (03/09/21) 1 yr ago (10/27/20)  Sodium 135 - 145 mmol/L 134 Low  136 138 139 R 139 R 139 R 141  Potassium 3.5 - 5.1 mmol/L 4.6 3.4 Low  4.0 4.8 R 4.2 R 4.0 R 3.2 Low   Chloride 98 - 111 mmol/L 91 Low  99 103 100 R 96 R 97 R   CO2 22 - 32 mmol/L 29 27 27 23  R 26 R 25 R   Glucose, Bld 70 - 99 mg/dL 409 High  811 High  CM 132 High  CM 130 High  110 High  129 High    Comment: Glucose reference Paul applies only to samples taken after fasting for at least 8 hours.  BUN 8 - 23 mg/dL 23 22 19 16  R 15 R 22 R   Creatinine, Ser 0.44 - 1.00 mg/dL 9.14 High  7.82 High  9.56 High  1.17 High  R 1.32 High  R 1.50 High  R   Calcium 8.9 - 10.3 mg/dL 9.2 8.8 Low  8.7 Low  9.1 R 9.1 R 9.5 R   GFR, Estimated >60 mL/min 39 Low  47 Low  CM 47 Low  CM      Comment:  (NOTE) Calculated using the CKD-EPI Creatinine Equation (2021)  Anion gap 5 - 15 14 10  CM 8 CM      Comment: Performed at [facility], [address] [place], [place] [address]  Resulting Agency [facility]         Specimen Collected: 05/04/22 15:39 Last Resulted: 05/04/22 15:53      Lab Flowsheet       Order Details       View Encounter       Lab and Collection Details       Routing       Result History     View All Conversations on this Encounter      CM=Additional comments  R=Reference Paul differs from displayed Paul      Result Care Coordination   Patient Communication   Add Comments   Not seen Back to Top     Satisfied Health Maintenance Topics   Back to Top Diabetic kidney evaluation - eGFR measurement (Yearly)  Next due on 06/04/2023  Address  Topic        Urine Culture (for pregnant, neutropenic or urologic patients or patients with an indwelling urinary catheter) (Order [id number]) Ordering Comments  Not on file   MyChart Results Release  MyChart Status: Pending  Results Release   Order Questions  Question Answer  Indication Dysuria         Urine Culture (for pregnant, neutropenic or urologic patients or patients with an indwelling urinary catheter): Patient Communication   Add Comments   Not seen     Encounter  View Encounter        Result Information  Flag: Abnormal Abnormal  Status: Final result (Collected: 05/04/2022 15:45) Provider Status: Ordered    Lab Information    [facility]              Order-Level Documents:  There are no order-level documents.  View SmartLink Info  Urine Culture (for pregnant, neutropenic or urologic patients or patients with an indwelling urinary catheter) (Order #[id number]) on 05/04/22   Result Read / Acknowledged  Acknowledge result No acknowledgement history exists for this order.    Urine  Culture (for pregnant, neutropenic or urologic patients or patients with an indwelling urinary catheter): Patient Communication   Add Comments   Not seen    Studies/Results: No results found. CLINICAL DATA:  Flank pain with dysuria   EXAM: CT ABDOMEN AND PELVIS WITHOUT CONTRAST   TECHNIQUE: Multidetector CT imaging of the abdomen and pelvis was performed following the standard protocol without IV contrast.   RADIATION DOSE REDUCTION: This exam was performed according to the departmental dose-optimization program which includes automated exposure control, adjustment of the mA and/or kV according to patient size and/or use of iterative reconstruction technique.   COMPARISON:  CT 12/25/2018, 08/21/2017   FINDINGS: Lower chest: Lung bases are clear. Incompletely visualized cardiac pacing leads. Cardiomegaly. Small hiatal hernia   Hepatobiliary: No focal liver abnormality is seen. Status post cholecystectomy. No biliary dilatation.   Pancreas: Unremarkable. No pancreatic ductal dilatation or surrounding inflammatory changes.   Spleen: Normal in size without focal abnormality.   Adrenals/Urinary Tract: Adrenal glands are normal. Kidneys show no hydronephrosis. Multiple low-density lesions in the right kidney incompletely characterized without contrast. Interval areas of punctate hyperdensity within some of the lesions. No ureteral stone. Bladder is normal   Stomach/Bowel: Stomach is within normal limits. Appendectomy. Diverticular disease of the colon without acute inflammatory process. Large stool burden. No evidence of bowel wall thickening, distention, or inflammatory changes.   Vascular/Lymphatic: Moderate aortic atherosclerosis. No aneurysm. No suspicious lymph nodes   Reproductive: Status post hysterectomy. No adnexal masses.   Other: Negative for pelvic effusion or free air. Small periumbilical fat containing hernia. Several adjacent fat containing supraumbilical  ventral hernias.   Musculoskeletal: No acute or significant osseous findings.   IMPRESSION: 1. No CT evidence for acute intra-abdominal or pelvic abnormality. Negative for hydronephrosis or ureteral stone. 2. Diverticular disease of the colon without acute inflammatory process. 3. Aortic atherosclerosis. 4. Multiple fat containing ventral hernia   Aortic Atherosclerosis (ICD10-I70.0).     Electronically Signed   By: Jasmine Pang M.D.   On: 04/23/2022 19:12  Assessment/Plan: Recurrent UTI with ESBL e. Coli and multiple drug allergies.   Cath UA today.  I will start fosfomycin q10 days for suppression since her UA is clear and she got relief with the fosfomycin from her recent UTI. .  Vaginal atrophy with moderate prolapse.  I will refill her estradiol cream.  MUI.  We could consider urodynamics for further evaluation.   Meds ordered this encounter  Medications   estradiol (ESTRACE) 0.1 MG/GM vaginal cream    Sig: 0.5gm vaginally at bedtime 2-3x weekly.    Dispense:  42.5 g    Refill:  12   fosfomycin (MONUROL) 3 g PACK    Sig: Take 3gm po every 10 days.    Dispense:  3 each    Refill:  11    She is needs 1 packet every 10 days, #3 with 11 refills.     Orders Placed This Encounter  Procedures   Urinalysis, Routine w reflex microscopic   BLADDER SCAN AMB NON-IMAGING     Return in about 6 weeks (around 07/25/2022) for with Huntley Dec for a cath UA.    CC: Melissa Rodney NP      Bjorn Pippin 06/14/2022

## 2022-06-15 ENCOUNTER — Other Ambulatory Visit: Payer: Self-pay | Admitting: Family

## 2022-06-19 ENCOUNTER — Other Ambulatory Visit: Payer: Self-pay | Admitting: Family

## 2022-06-24 ENCOUNTER — Ambulatory Visit: Payer: Medicare Other | Attending: Cardiovascular Disease

## 2022-06-24 DIAGNOSIS — I5022 Chronic systolic (congestive) heart failure: Secondary | ICD-10-CM

## 2022-06-24 DIAGNOSIS — Z95810 Presence of automatic (implantable) cardiac defibrillator: Secondary | ICD-10-CM

## 2022-06-25 NOTE — Progress Notes (Signed)
EPIC Encounter for ICM Monitoring  Patient Name: Melissa Paul is a 77 y.o. female Date: 06/25/2022 Primary Care Physican: Junie Spencer, FNP Primary Cardiologist: [place] Electrophysiologist: Mealor Bi-V Pacing: 91%        01/01/2022 Weight: 178 lbs 03/15/2022 Weight: 170 lbs 05/24/2022 Weight:  162 lbs 06/25/2022 Weight:  165 lbs   AT/AF Burden: 1.8% (taking Eliquis)         Spoke with patient and heart failure questions reviewed.  Transmission results reviewed.  Pt reports stomach tightness, ankle/leg swelling and 5-6 lbs weight gain overnight.  She is having trouble with urinary retention due to recurrent UTI.  She follows up with urologist regarding UTI.    CorVue Thoracic impedance suggesting possible fluid accumulation from 5/7-5/22 and 5/25-5/31 and returning to baseline 6/1.     Prescribed:  Furosemide 80 mg Take 80 mg by mouth daily as needed.  Pt takes extra if needed.   06/25/22 She is taking about twice a week due to her body can't handle Furosemide daily.   Potassium 20 mEq take 1 tablet by mouth as directed.  Only take 1 tablet with extra doses of Lasix. Spirolactone 25 mg take 1 tablet by mouth daily Farxiga 5 mg take 1 tablet daily   Labs: 05/04/2022 Creatinine 1.40, BUN 23, Potassium 4.6, Sodium 134, GFR 39  04/23/2022 Creatinine 1.18, BUN 22, Potassium 3.4, Sodium 136, GFR 47  A complete set of results can be found in Results Review.   Recommendations:  No changes and encouraged to call if experiencing any fluid symptoms.   Follow-up plan: ICM clinic phone appointment on 07/16/2022 to recheck fluid levels.   91 day device clinic remote transmission 07/15/2022.    EP/Cardiology Office Visits:   Recall 04/03/2022.    08/23/2022 with Dr Nelly Laurence.   Copy of ICM check sent to Dr. Nelly Laurence.     3 month ICM trend: 06/24/2022.    12-14 Month ICM trend:     Karie Soda, RN 06/25/2022 9:46 AM

## 2022-07-08 ENCOUNTER — Other Ambulatory Visit: Payer: Self-pay | Admitting: Family

## 2022-07-08 ENCOUNTER — Encounter (HOSPITAL_COMMUNITY): Payer: Self-pay | Admitting: Emergency Medicine

## 2022-07-08 ENCOUNTER — Other Ambulatory Visit: Payer: Self-pay

## 2022-07-08 ENCOUNTER — Emergency Department (HOSPITAL_COMMUNITY): Payer: Medicare Other

## 2022-07-08 ENCOUNTER — Observation Stay (HOSPITAL_COMMUNITY)
Admission: EM | Admit: 2022-07-08 | Discharge: 2022-07-08 | Discharge status: Against Medical Advice | Payer: Medicare Other | Attending: Internal Medicine | Admitting: Internal Medicine

## 2022-07-08 DIAGNOSIS — Z950 Presence of cardiac pacemaker: Secondary | ICD-10-CM | POA: Insufficient documentation

## 2022-07-08 DIAGNOSIS — E039 Hypothyroidism, unspecified: Secondary | ICD-10-CM | POA: Diagnosis not present

## 2022-07-08 DIAGNOSIS — I5023 Acute on chronic systolic (congestive) heart failure: Secondary | ICD-10-CM | POA: Diagnosis not present

## 2022-07-08 DIAGNOSIS — I480 Paroxysmal atrial fibrillation: Secondary | ICD-10-CM | POA: Insufficient documentation

## 2022-07-08 DIAGNOSIS — Z7901 Long term (current) use of anticoagulants: Secondary | ICD-10-CM | POA: Diagnosis not present

## 2022-07-08 DIAGNOSIS — Z87891 Personal history of nicotine dependence: Secondary | ICD-10-CM | POA: Diagnosis not present

## 2022-07-08 DIAGNOSIS — R0600 Dyspnea, unspecified: Secondary | ICD-10-CM | POA: Diagnosis not present

## 2022-07-08 DIAGNOSIS — Z79899 Other long term (current) drug therapy: Secondary | ICD-10-CM | POA: Diagnosis not present

## 2022-07-08 DIAGNOSIS — Z7984 Long term (current) use of oral hypoglycemic drugs: Secondary | ICD-10-CM | POA: Insufficient documentation

## 2022-07-08 DIAGNOSIS — R0602 Shortness of breath: Secondary | ICD-10-CM | POA: Diagnosis present

## 2022-07-08 DIAGNOSIS — I130 Hypertensive heart and chronic kidney disease with heart failure and stage 1 through stage 4 chronic kidney disease, or unspecified chronic kidney disease: Secondary | ICD-10-CM | POA: Diagnosis not present

## 2022-07-08 DIAGNOSIS — N189 Chronic kidney disease, unspecified: Secondary | ICD-10-CM | POA: Diagnosis not present

## 2022-07-08 DIAGNOSIS — Z91040 Latex allergy status: Secondary | ICD-10-CM | POA: Diagnosis not present

## 2022-07-08 DIAGNOSIS — E1122 Type 2 diabetes mellitus with diabetic chronic kidney disease: Secondary | ICD-10-CM | POA: Diagnosis not present

## 2022-07-08 DIAGNOSIS — I509 Heart failure, unspecified: Secondary | ICD-10-CM

## 2022-07-08 LAB — CBC WITH DIFFERENTIAL/PLATELET
Abs Immature Granulocytes: 0.05 10*3/uL (ref 0.00–0.07)
Basophils Absolute: 0 10*3/uL (ref 0.0–0.1)
Basophils Relative: 1 %
Eosinophils Absolute: 0.1 10*3/uL (ref 0.0–0.5)
Eosinophils Relative: 1 %
HCT: 30 % — ABNORMAL LOW (ref 36.0–46.0)
Hemoglobin: 9.1 g/dL — ABNORMAL LOW (ref 12.0–15.0)
Immature Granulocytes: 1 %
Lymphocytes Relative: 35 %
Lymphs Abs: 1.9 10*3/uL (ref 0.7–4.0)
MCH: 24.6 pg — ABNORMAL LOW (ref 26.0–34.0)
MCHC: 30.3 g/dL (ref 30.0–36.0)
MCV: 81.1 fL (ref 80.0–100.0)
Monocytes Absolute: 0.6 10*3/uL (ref 0.1–1.0)
Monocytes Relative: 10 %
Neutro Abs: 2.8 10*3/uL (ref 1.7–7.7)
Neutrophils Relative %: 52 %
Platelets: 244 10*3/uL (ref 150–400)
RBC: 3.7 MIL/uL — ABNORMAL LOW (ref 3.87–5.11)
RDW: 18.6 % — ABNORMAL HIGH (ref 11.5–15.5)
WBC: 5.4 10*3/uL (ref 4.0–10.5)
nRBC: 0 % (ref 0.0–0.2)

## 2022-07-08 LAB — URINALYSIS, ROUTINE W REFLEX MICROSCOPIC
Bacteria, UA: NONE SEEN
Bilirubin Urine: NEGATIVE
Glucose, UA: >=500 mg/dL — AB
Hgb urine dipstick: NEGATIVE
Ketones, ur: NEGATIVE mg/dL
Leukocytes,Ua: NEGATIVE
Nitrite: POSITIVE — AB
Protein, ur: NEGATIVE mg/dL
Specific Gravity, Urine: 1.01 (ref 1.005–1.030)
pH: 7 (ref 5.0–8.0)

## 2022-07-08 LAB — HEPATIC FUNCTION PANEL
ALT: 9 U/L (ref 0–44)
AST: 13 U/L — ABNORMAL LOW (ref 15–41)
Albumin: 4.1 g/dL (ref 3.5–5.0)
Alkaline Phosphatase: 61 U/L (ref 38–126)
Bilirubin, Direct: 0.1 mg/dL (ref 0.0–0.2)
Indirect Bilirubin: 0.5 mg/dL (ref 0.3–0.9)
Total Bilirubin: 0.6 mg/dL (ref 0.3–1.2)
Total Protein: 6.9 g/dL (ref 6.5–8.1)

## 2022-07-08 LAB — BRAIN NATRIURETIC PEPTIDE: B Natriuretic Peptide: 608 pg/mL — ABNORMAL HIGH (ref 0.0–100.0)

## 2022-07-08 LAB — PROTIME-INR
INR: 1.5 — ABNORMAL HIGH (ref 0.8–1.2)
Prothrombin Time: 18.5 seconds — ABNORMAL HIGH (ref 11.4–15.2)

## 2022-07-08 LAB — BASIC METABOLIC PANEL
Anion gap: 9 (ref 5–15)
BUN: 16 mg/dL (ref 8–23)
CO2: 26 mmol/L (ref 22–32)
Calcium: 8.3 mg/dL — ABNORMAL LOW (ref 8.9–10.3)
Chloride: 100 mmol/L (ref 98–111)
Creatinine, Ser: 1.09 mg/dL — ABNORMAL HIGH (ref 0.44–1.00)
GFR, Estimated: 52 mL/min — ABNORMAL LOW (ref >60)
Glucose, Bld: 141 mg/dL — ABNORMAL HIGH (ref 70–99)
Potassium: 3.6 mmol/L (ref 3.5–5.1)
Sodium: 135 mmol/L (ref 135–145)

## 2022-07-08 LAB — TSH: TSH: 2.534 u[IU]/mL (ref 0.350–4.500)

## 2022-07-08 LAB — TROPONIN I (HIGH SENSITIVITY)
Troponin I (High Sensitivity): 13 ng/L (ref <18)
Troponin I (High Sensitivity): 13 ng/L (ref <18)

## 2022-07-08 LAB — CBG MONITORING, ED: Glucose-Capillary: 111 mg/dL — ABNORMAL HIGH (ref 70–99)

## 2022-07-08 LAB — MAGNESIUM: Magnesium: 2 mg/dL (ref 1.7–2.4)

## 2022-07-08 MED ORDER — FUROSEMIDE 10 MG/ML IJ SOLN
40.0000 mg | Freq: Once | INTRAMUSCULAR | Status: AC
  Administered 2022-07-08: 40 mg via INTRAVENOUS
  Filled 2022-07-08: qty 4

## 2022-07-08 MED ORDER — POTASSIUM CHLORIDE CRYS ER 20 MEQ PO TBCR: 40.0000 meq | EXTENDED_RELEASE_TABLET | Freq: Once | ORAL | Status: DC

## 2022-07-08 NOTE — H&P (Signed)
Medical Consultation   Melissa Paul  YQM:[id number]  DOB: 07-01-1945  DOA: 07/08/2022  PCP: Junie Spencer, FNP  Outpatient Specialists: Home   Requesting physician: EDP- Dr. Durwin Nora  Reason for consultation: Difficulty breathing  History of Present Illness: Melissa Paul is an 77 y.o. female systolic CHF, paroxysmal atrial fibrillation, ICD, MR, diabetes mellitus, LBBB. Patient presented to the ED with complaints of difficulty breathing and palpitations for the past 3 days.  She reports difficulty breathing when lying down and with exertion.  She is had to sleep after 45 degrees angle over the past few days.  She denies chest pain, reports some chest tightness with difficulty breathing.   She reports compliance with her Lasix daily.  She denies weight gain, reports she has actually lost weight.  No abdominal bloating.  No leg swelling.  No fevers no chills no cough.  Denies urinary symptoms of dysuria, reports stable unchanged frequency from Lasix.  No abdominal pain.  Son Marilu Favre is at bedside.  ED course, O2 sats dropped to 86% on room air with ambulation to the bathroom and back patient appeared short of breath requiring assistance back to bed.  Troponin 13 X 2.  Magnesium 2.  BNP mildly elevated from prior at 608.  UA with positive nitrites.  Chest x-ray without acute abnormalities,  Compared to recent prior chest radiograph the right atrial lead appears slightly retracted. ICD interrogated in the ED, no acute cardiac events to correspond with patient's symptoms, patient does have high PVC burden at 3.7%. IV Lasix 40 mg x 1 with good response.  Review of Systems: Listed in HPI, otherwise negative review of systems  Past Medical History: Past Medical History:  Diagnosis Date   Anxiety    Chronic systolic heart failure (HCC)    Constipation    Cyst of right kidney 11/12/2017   Glucose intolerance (pre-diabetes)    Hyperlipemia    Hypothyroidism    LBBB  (left bundle branch block)    Mitral regurgitation    Mild to Moderate   Nonischemic cardiomyopathy (HCC)    LVEF 35-40% by echo 8/13   Paroxysmal atrial fibrillation (HCC)    Brief episodes by device interrogation   UTI (urinary tract infection)    Recurrent   Ventricular tachycardia (HCC)    ICD therapy for VT    Past Surgical History: Past Surgical History:  Procedure Laterality Date   APPENDECTOMY     BIV ICD GENERATOR CHANGEOUT N/A 07/11/2016   Procedure: BiV ICD Generator Changeout;  Surgeon: Hillis Range, MD;  Location: [facility] INVASIVE CV LAB;  Service: Cardiovascular;  Laterality: N/A;   CARDIAC DEFIBRILLATOR PLACEMENT  09/2009   [employer] Jude BiV ICD by Dr. Shanna Cisco class III   CHOLECYSTECTOMY     has one ovary left     KNEE ARTHROSCOPY     Left   LEFT AND RIGHT HEART CATHETERIZATION WITH CORONARY ANGIOGRAM N/A 04/12/2014   Procedure: LEFT AND RIGHT HEART CATHETERIZATION WITH CORONARY ANGIOGRAM;  Surgeon: Marykay Lex, MD;  Location: [facility] CATH LAB;  Service: Cardiovascular;  Laterality: N/A;   Left breast biopsy x 2     no maliganancy   PACEMAKER IMPLANT     RIGHT HEART CATH N/A 10/27/2020   Procedure: RIGHT HEART CATH;  Surgeon: Laurey Morale, MD;  Location: [facility] INVASIVE CV LAB;  Service: Cardiovascular;  Laterality: N/A;   RIGHT/LEFT HEART CATH AND  CORONARY ANGIOGRAPHY N/A 03/26/2019   Procedure: RIGHT/LEFT HEART CATH AND CORONARY ANGIOGRAPHY;  Surgeon: Laurey Morale, MD;  Location: [facility] INVASIVE CV LAB;  Service: Cardiovascular;  Laterality: N/A;   TOTAL ABDOMINAL HYSTERECTOMY     (R) ovary removed     Allergies:   Allergies  Allergen Reactions   Buspar [Buspirone] Other (See Comments)    Patient states that she became hyper and had lots of itching.   Lexapro [Escitalopram Oxalate] Other (See Comments)    Patient states that she became hyper and lots of itching   Trulicity [Dulaglutide] Hives and Itching    Tongue was numb.   Bactrim Rash   Cephalexin Swelling  and Rash    Says she is able to take cefuroxime Throat swelling   Chlorhexidine Gluconate Hives and Itching    Hives, itching after using wipes with last procedure   Clindamycin Rash        Contrast Media [Iodinated Contrast Media] Rash   Crestor [Rosuvastatin] Hives and Rash   Doxycycline Rash   Latex Rash        Levofloxacin Rash        Nitrofurantoin Monohyd Macro Rash        Penicillins Swelling and Rash    Has patient had a PCN reaction causing immediate rash, facial/tongue/throat swelling, SOB or lightheadedness with hypotension: Yes Has patient had a PCN reaction causing severe rash involving mucus membranes or skin necrosis: Yes Has patient had a PCN reaction that required hospitalization: No Has patient had a PCN reaction occurring within the last 10 years: No Rash/swelled tongue If all of the above answers are "NO", then may proceed with Cephalosporin use.      Social History:  reports that she quit smoking about 26 years ago. Her smoking use included cigarettes. She started smoking about 72 years ago. She has a 6.00 pack-year smoking history. She has never been exposed to tobacco smoke. She has never used smokeless tobacco. She reports that she does not drink alcohol and does not use drugs.   Family History: Family History  Problem Relation Age of Onset   CVA Mother        multiple   Heart Problems Mother        "weak heart"   Heart disease Maternal Grandmother    Heart disease Maternal Grandfather    Cancer Father        Stomach and eshpogus   Diabetes Neg Hx    Hypertension Neg Hx    Coronary artery disease Neg Hx     Physical Exam: Vitals:   07/08/22 1236 07/08/22 1454 07/08/22 1456 07/08/22 1545  BP: 111/60   94/66  Pulse: 70   72  Resp: 10   20  Temp: 97.9 F (36.6 C)     TempSrc: Oral     SpO2: 98% (!) 86% 95% 96%    Constitutional: Alert and awake, oriented x3, not in any acute distress. Eyes: PERLA, EOMI, irises appear normal,  anicteric sclera,  ENMT: external ears and nose appear normal, normal hearing or hard of hearing            Lips appears normal, oropharynx mucosa, tongue, posterior pharynx appear normal  Neck: neck appears normal, no masses, normal ROM, no thyromegaly, no JVD  CVS: S1-S2 clear, no murmur rubs or gallops, no LE edema, normal pedal pulses  Respiratory:  clear to auscultation bilaterally, no wheezing, rales or rhonchi. Respiratory effort normal. No accessory muscle use.  Abdomen:  soft nontender, nondistended, normal bowel sounds, no hepatosplenomegaly, no hernias  Musculoskeletal: : no cyanosis, clubbing or edema noted bilaterally Neuro: Cranial nerves II-XII intact, strength, sensation, reflexes Psych: judgement and insight appear normal, stable mood and affect, mental status Skin: no rashes or lesions or ulcers, no induration or nodules   Data reviewed:  I have personally reviewed following labs and imaging studies Labs:  CBC: Recent Labs  Lab 07/08/22 0843  WBC 5.4  NEUTROABS 2.8  HGB 9.1*  HCT 30.0*  MCV 81.1  PLT 244    Basic Metabolic Panel: Recent Labs  Lab 07/08/22 0843  NA 135  K 3.6  CL 100  CO2 26  GLUCOSE 141*  BUN 16  CREATININE 1.09*  CALCIUM 8.3*  MG 2.0   GFR CrCl cannot be calculated (Unknown ideal weight.). Liver Function Tests: Recent Labs  Lab 07/08/22 0843  AST 13*  ALT 9  ALKPHOS 61  BILITOT 0.6  PROT 6.9  ALBUMIN 4.1   Coagulation profile Recent Labs  Lab 07/08/22 0843  INR 1.5*   CBG: Recent Labs  Lab 07/08/22 1357  GLUCAP 111*   Thyroid function studies Recent Labs    07/08/22 1132  TSH 2.534   Urinalysis    Component Value Date/Time   COLORURINE YELLOW 07/08/2022 1032   APPEARANCEUR HAZY (A) 07/08/2022 1032   APPEARANCEUR Clear 06/13/2022 1339   LABSPEC 1.010 07/08/2022 1032   PHURINE 7.0 07/08/2022 1032   GLUCOSEU >=500 (A) 07/08/2022 1032   HGBUR NEGATIVE 07/08/2022 1032   BILIRUBINUR NEGATIVE 07/08/2022 1032    BILIRUBINUR Negative 06/13/2022 1339   KETONESUR NEGATIVE 07/08/2022 1032   PROTEINUR NEGATIVE 07/08/2022 1032   UROBILINOGEN 0.2 10/01/2019 1739   UROBILINOGEN 1.0 04/11/2007 2008   NITRITE POSITIVE (A) 07/08/2022 1032   LEUKOCYTESUR NEGATIVE 07/08/2022 1032     Sepsis Labs Recent Labs  Lab 07/08/22 0843  WBC 5.4   Radiological Exams on Admission: DG Chest Portable 1 View  Result Date: 07/08/2022 CLINICAL DATA:  Shortness of breath EXAM: PORTABLE CHEST 1 VIEW COMPARISON:  CXR 01/23/22 FINDINGS: Left-sided triple lead cardiac device in place. Compared to recent prior chest radiograph the right atrial lead appears slightly retracted. No pleural effusion. No pneumothorax. Mild cardiomegaly. No focal airspace opacity. No radiographically apparent displaced rib fractures. Visualized upper abdomen is unremarkable. IMPRESSION: 1. No acute pulmonary abnormality. 2. Compared to recent prior chest radiograph the right atrial lead appears slightly retracted. Correlate with function and intended placement. 3. Mild cardiomegaly. Electronically Signed   By: Lorenza Cambridge M.D.   On: 07/08/2022 09:12    Impression/Recommendations Principal Problem:   Dyspnea  Dyspnea-BNP elevated from prior at 608.  EG without changes.  Troponin 13 x 2.  Chest x-ray without acute abnormality.   -ICD interrogated in ED-high PVC burden at 3.7%, no acute cardiac events to correspond with patient's symptoms. - O2 sats initially dropped to 86% on room air with ambulation, on my evaluation she is patient's status post 40 mg Lasix, with improvement in symptoms.  She declines admission.   - I have explained that her chest x-ray has not showed any acute abnormality to explain her symptoms, and she has no evidence of volume overload clinically, per chart her weight has improved and so next we would need a CTA chest.  She has no lower extremity swelling or symptoms to suggest thromboembolism.  No fevers no leukocytosis to suggest  infectious etiology.   - She has contrast allergy- rash, which I confirmed,  and patient will need 13-hour premedication before CT can be done.  She does not want to stay in the hospital for a chest CT, she feels better, she requests to be discharged.  She was subsequently ambulated in the ED with sats remaining at 94% on room air , without dyspnea.  -I have communicated to ED provider -Recommend patient follow-up with her outpatient provider, and take a dose of oral potassium chloride in the ED before discharge. -Patient left before oral K or discharge papers could be given.  The rest of patient's chronic medical conditions appear stable.   Time Spent:  Onnie Boer M.D. [facility]  07/08/2022, 6:14 PM

## 2022-07-08 NOTE — ED Notes (Signed)
ED TO INPATIENT HANDOFF REPORT  ED Nurse Name and Phone #: [phone]   S Name/Age/Gender Melissa Paul 77 y.o. female Room/Bed: APA03/APA03  Code Status   Code Status: Prior  Home/SNF/Other Home Patient oriented to: self, place, time, and situation Is this baseline? Yes   Triage Complete: Triage complete  Chief Complaint Dyspnea [R06.00]  Triage Note Pt from home with reports of "feeling like my heart is fluttering." Pt also reports shortness of breath and trouble sleeping.   Allergies Allergies  Allergen Reactions   Buspar [Buspirone] Other (See Comments)    Patient states that she became hyper and had lots of itching.   Lexapro [Escitalopram Oxalate] Other (See Comments)    Patient states that she became hyper and lots of itching   Trulicity [Dulaglutide] Hives and Itching    Tongue was numb.   Bactrim Rash   Cephalexin Swelling and Rash    Says she is able to take cefuroxime Throat swelling   Chlorhexidine Gluconate Hives and Itching    Hives, itching after using wipes with last procedure   Clindamycin Rash        Contrast Media [Iodinated Contrast Media] Rash   Crestor [Rosuvastatin] Hives and Rash   Doxycycline Rash   Latex Rash        Levofloxacin Rash        Nitrofurantoin Monohyd Macro Rash        Penicillins Swelling and Rash    Has patient had a PCN reaction causing immediate rash, facial/tongue/throat swelling, SOB or lightheadedness with hypotension: Yes Has patient had a PCN reaction causing severe rash involving mucus membranes or skin necrosis: Yes Has patient had a PCN reaction that required hospitalization: No Has patient had a PCN reaction occurring within the last 10 years: No Rash/swelled tongue If all of the above answers are "NO", then may proceed with Cephalosporin use.     Level of Care/Admitting Diagnosis ED Disposition     ED Disposition  Admit   Condition  --   Comment  Hospital Area: [facility]  [[id number]]  Level of Care: Telemetry [5]  Covid Evaluation: Asymptomatic - no recent exposure (last 10 days) testing not required  Diagnosis: Dyspnea [241871]  Admitting Physician: Onnie Boer [[id number]]  Attending Physician: Cresenciano Lick          B Medical/Surgery History Past Medical History:  Diagnosis Date   Anxiety    Chronic systolic heart failure (HCC)    Constipation    Cyst of right kidney 11/12/2017   Glucose intolerance (pre-diabetes)    Hyperlipemia    Hypothyroidism    LBBB (left bundle branch block)    Mitral regurgitation    Mild to Moderate   Nonischemic cardiomyopathy (HCC)    LVEF 35-40% by echo 8/13   Paroxysmal atrial fibrillation (HCC)    Brief episodes by device interrogation   UTI (urinary tract infection)    Recurrent   Ventricular tachycardia (HCC)    ICD therapy for VT   Past Surgical History:  Procedure Laterality Date   APPENDECTOMY     BIV ICD GENERATOR CHANGEOUT N/A 07/11/2016   Procedure: BiV ICD Generator Changeout;  Surgeon: Hillis Range, MD;  Location: [facility] INVASIVE CV LAB;  Service: Cardiovascular;  Laterality: N/A;   CARDIAC DEFIBRILLATOR PLACEMENT  09/2009   [employer] Jude BiV ICD by Dr. Shanna Cisco class III   CHOLECYSTECTOMY     has one ovary left     KNEE ARTHROSCOPY  Left   LEFT AND RIGHT HEART CATHETERIZATION WITH CORONARY ANGIOGRAM N/A 04/12/2014   Procedure: LEFT AND RIGHT HEART CATHETERIZATION WITH CORONARY ANGIOGRAM;  Surgeon: Marykay Lex, MD;  Location: [facility] CATH LAB;  Service: Cardiovascular;  Laterality: N/A;   Left breast biopsy x 2     no maliganancy   PACEMAKER IMPLANT     RIGHT HEART CATH N/A 10/27/2020   Procedure: RIGHT HEART CATH;  Surgeon: Laurey Morale, MD;  Location: [facility] INVASIVE CV LAB;  Service: Cardiovascular;  Laterality: N/A;   RIGHT/LEFT HEART CATH AND CORONARY ANGIOGRAPHY N/A 03/26/2019   Procedure: RIGHT/LEFT HEART CATH AND CORONARY ANGIOGRAPHY;  Surgeon: Laurey Morale, MD;   Location: [facility] INVASIVE CV LAB;  Service: Cardiovascular;  Laterality: N/A;   TOTAL ABDOMINAL HYSTERECTOMY     (R) ovary removed     A IV Location/Drains/Wounds Patient Lines/Drains/Airways Status     Active Line/Drains/Airways     Name Placement date Placement time Site Days   Peripheral IV 07/08/22 18 G Right Antecubital 07/08/22  0848  Antecubital  less than 1   External Urinary Catheter 07/08/22  1246  --  less than 1            Intake/Output Last 24 hours  Intake/Output Summary (Last 24 hours) at 07/08/2022 1554 Last data filed at 07/08/2022 1401 Gross per 24 hour  Intake --  Output 1200 ml  Net -1200 ml    Labs/Imaging Results for orders placed or performed during the hospital encounter of 07/08/22 (from the past 48 hour(s))  Basic metabolic panel     Status: Abnormal   Collection Time: 07/08/22  8:43 AM  Result Value Ref Range   Sodium 135 135 - 145 mmol/L   Potassium 3.6 3.5 - 5.1 mmol/L   Chloride 100 98 - 111 mmol/L   CO2 26 22 - 32 mmol/L   Glucose, Bld 141 (H) 70 - 99 mg/dL    Comment: Glucose reference range applies only to samples taken after fasting for at least 8 hours.   BUN 16 8 - 23 mg/dL   Creatinine, Ser 1.61 (H) 0.44 - 1.00 mg/dL   Calcium 8.3 (L) 8.9 - 10.3 mg/dL   GFR, Estimated 52 (L) >60 mL/min    Comment: (NOTE) Calculated using the CKD-EPI Creatinine Equation (2021)    Anion gap 9 5 - 15    Comment: Performed at [facility], [address], [place], [place] [address]  Troponin I (High Sensitivity)     Status: None   Collection Time: 07/08/22  8:43 AM  Result Value Ref Range   Troponin I (High Sensitivity) 13 <18 ng/L    Comment: (NOTE) Elevated high sensitivity troponin I (hsTnI) values and significant  changes across serial measurements may suggest ACS but many other  chronic and acute conditions are known to elevate hsTnI results.  Refer to the "Links" section for chest pain algorithms and additional  guidance. Performed at [facility], [address], [place], [place] [address]   Magnesium     Status: None   Collection Time: 07/08/22  8:43 AM  Result Value Ref Range   Magnesium 2.0 1.7 - 2.4 mg/dL    Comment: Performed at [facility], [address] [place], [place] [address]  Brain natriuretic peptide (order if patient c/o SOB ONLY)     Status: Abnormal   Collection Time: 07/08/22  8:43 AM  Result Value Ref Range   B Natriuretic Peptide 608.0 (H) 0.0 - 100.0  pg/mL    Comment: Performed at [facility], [address] [place], [place] [address]  CBC with Differential/Platelet     Status: Abnormal   Collection Time: 07/08/22  8:43 AM  Result Value Ref Range   WBC 5.4 4.0 - 10.5 K/uL   RBC 3.70 (L) 3.87 - 5.11 MIL/uL   Hemoglobin 9.1 (L) 12.0 - 15.0 g/dL   HCT 60.4 (L) 54.0 - 98.1 %   MCV 81.1 80.0 - 100.0 fL   MCH 24.6 (L) 26.0 - 34.0 pg   MCHC 30.3 30.0 - 36.0 g/dL   RDW 19.1 (H) 47.8 - 29.5 %   Platelets 244 150 - 400 K/uL   nRBC 0.0 0.0 - 0.2 %   Neutrophils Relative % 52 %   Neutro Abs 2.8 1.7 - 7.7 K/uL   Lymphocytes Relative 35 %   Lymphs Abs 1.9 0.7 - 4.0 K/uL   Monocytes Relative 10 %   Monocytes Absolute 0.6 0.1 - 1.0 K/uL   Eosinophils Relative 1 %   Eosinophils Absolute 0.1 0.0 - 0.5 K/uL   Basophils Relative 1 %   Basophils Absolute 0.0 0.0 - 0.1 K/uL   Immature Granulocytes 1 %   Abs Immature Granulocytes 0.05 0.00 - 0.07 K/uL    Comment: Performed at [facility], [address], [place], [place] [address]  Protime-INR     Status: Abnormal   Collection Time: 07/08/22  8:43 AM  Result Value Ref Range   Prothrombin Time 18.5 (H) 11.4 - 15.2 seconds   INR 1.5 (H) 0.8 - 1.2    Comment: (NOTE) INR goal varies based on device and disease states. Performed at [facility], [address] [place], [place] [address]   Hepatic function panel     Status: Abnormal   Collection Time: 07/08/22  8:43 AM  Result Value Ref Range   Total Protein 6.9 6.5 - 8.1 g/dL   Albumin 4.1 3.5 - 5.0 g/dL    AST 13 (L) 15 - 41 U/L   ALT 9 0 - 44 U/L   Alkaline Phosphatase 61 38 - 126 U/L   Total Bilirubin 0.6 0.3 - 1.2 mg/dL   Bilirubin, Direct 0.1 0.0 - 0.2 mg/dL   Indirect Bilirubin 0.5 0.3 - 0.9 mg/dL    Comment: Performed at [facility], [address] [place], [place] [address]  Urinalysis, Routine w reflex microscopic -Urine, Clean Catch     Status: Abnormal   Collection Time: 07/08/22 10:32 AM  Result Value Ref Range   Color, Urine YELLOW YELLOW   APPearance HAZY (A) CLEAR   Specific Gravity, Urine 1.010 1.005 - 1.030   pH 7.0 5.0 - 8.0   Glucose, UA >=500 (A) NEGATIVE mg/dL   Hgb urine dipstick NEGATIVE NEGATIVE   Bilirubin Urine NEGATIVE NEGATIVE   Ketones, ur NEGATIVE NEGATIVE mg/dL   Protein, ur NEGATIVE NEGATIVE mg/dL   Nitrite POSITIVE (A) NEGATIVE   Leukocytes,Ua NEGATIVE NEGATIVE   RBC / HPF 0-5 0 - 5 RBC/hpf   WBC, UA 0-5 0 - 5 WBC/hpf   Bacteria, UA NONE SEEN NONE SEEN   Squamous Epithelial / HPF 0-5 0 - 5 /HPF    Comment: Performed at [facility], [address] [place], [place] [address]  Troponin I (High Sensitivity)     Status: None   Collection Time: 07/08/22 10:38 AM  Result Value Ref Range   Troponin I (High Sensitivity) 13 <18 ng/L    Comment: (NOTE) Elevated high sensitivity troponin  I (hsTnI) values and significant  changes across serial measurements may suggest ACS but many other  chronic and acute conditions are known to elevate hsTnI results.  Refer to the "Links" section for chest pain algorithms and additional  guidance. Performed at [facility], [address] [place], [place] [address]   TSH     Status: None   Collection Time: 07/08/22 11:32 AM  Result Value Ref Range   TSH 2.534 0.350 - 4.500 uIU/mL    Comment: Performed by a 3rd Generation assay with a functional sensitivity of <=0.01 uIU/mL. Performed at [facility], [address], [place], [place] [address]   CBG monitoring, ED     Status: Abnormal   Collection Time: 07/08/22  1:57  PM  Result Value Ref Range   Glucose-Capillary 111 (H) 70 - 99 mg/dL    Comment: Glucose reference range applies only to samples taken after fasting for at least 8 hours.   *Note: Due to a large number of results and/or encounters for the requested time period, some results have not been displayed. A complete set of results can be found in Results Review.   DG Chest Portable 1 View  Result Date: 07/08/2022 CLINICAL DATA:  Shortness of breath EXAM: PORTABLE CHEST 1 VIEW COMPARISON:  CXR 01/23/22 FINDINGS: Left-sided triple lead cardiac device in place. Compared to recent prior chest radiograph the right atrial lead appears slightly retracted. No pleural effusion. No pneumothorax. Mild cardiomegaly. No focal airspace opacity. No radiographically apparent displaced rib fractures. Visualized upper abdomen is unremarkable. IMPRESSION: 1. No acute pulmonary abnormality. 2. Compared to recent prior chest radiograph the right atrial lead appears slightly retracted. Correlate with function and intended placement. 3. Mild cardiomegaly. Electronically Signed   By: Lorenza Cambridge M.D.   On: 07/08/2022 09:12    Pending Labs Unresulted Labs (From admission, onward)    None       Vitals/Pain Today's Vitals   07/08/22 1227 07/08/22 1236 07/08/22 1454 07/08/22 1456  BP: 106/63 111/60    Pulse: 71 70    Resp: 17 10    Temp:  97.9 F (36.6 C)    TempSrc:  Oral    SpO2: 99% 98% (!) 86% 95%  PainSc:        Isolation Precautions No active isolations  Medications Medications  furosemide (LASIX) injection 40 mg (40 mg Intravenous Given 07/08/22 1140)    Mobility walks with device     Focused Assessments Cardiac Assessment Handoff:    Lab Results  Component Value Date   CKTOTAL 161 04/11/2007   CKMB 1.1 04/11/2007   TROPONINI <0.03 02/27/2017   Lab Results  Component Value Date   DDIMER  04/11/2007    0.33        AT THE INHOUSE ESTABLISHED CUTOFF VALUE OF 0.48 ug/mL FEU, THIS ASSAY HAS  BEEN DOCUMENTED IN THE LITERATURE TO HAVE   Does the Patient currently have chest pain? No    R Recommendations: See Admitting Provider Note  Report given to:   Additional Notes:

## 2022-07-08 NOTE — ED Notes (Signed)
Pt provided with ice chips per request. OK per MD Dixon.

## 2022-07-08 NOTE — ED Notes (Signed)
Pt signed AMA form, ready to leave prior to discharge being complete.

## 2022-07-08 NOTE — ED Notes (Signed)
Pt requesting to go home. States she feels a lot better after IV Lasix. Ambulated around department, tolerated well. SaO2 94% RA after ambulating. MD notified.

## 2022-07-08 NOTE — ED Notes (Signed)
Updated daughter via phone.  All questions answered. 

## 2022-07-08 NOTE — ED Notes (Signed)
Pt unable to ambulate without significantly increased work of breathing and hypoxia. Uses walker to ambulate at home, but using W/C to get to and from [facility] here today.

## 2022-07-08 NOTE — ED Notes (Signed)
CBG checked per pt request. States she is diabetic and feels jittery. CBG 111.

## 2022-07-08 NOTE — ED Notes (Signed)
Rep from [facility] Jude interrogator called, states no acute cardiac events to correspond with patients symptoms. Pt does have high PVC burden at 3.7%. MD notified.

## 2022-07-08 NOTE — ED Notes (Signed)
Used [employer] home transmitter to Biomedical scientist. Difficulty transmitting report, called customer support number and they received transmission. Will fax report when it is ready.

## 2022-07-08 NOTE — ED Provider Notes (Signed)
[place] EMERGENCY DEPARTMENT AT [facility] Provider Note   CSN: [id number] Arrival date & time: 07/08/22  0818     History  Chief Complaint  Patient presents with   Palpitations    Melissa Paul is a 77 y.o. female.  HPI Patient presents for palpitations and shortness of breath.  Medical history includes CHF, atrial fibrillation, pacemaker placement, T2DM, anxiety, CKD, anemia.  Symptoms have been ongoing for the past 3 days.  Palpitations are worse at night, when laying down.  Because of this, she had poor sleep overnight.  She has been told that her pacemaker battery will likely run out at the end of the year.  She did have a transient episode of chest tightness this morning.  She denies any other recent pain.    Home Medications Prior to Admission medications   Medication Sig Start Date End Date Taking? Authorizing Provider  acetaminophen (TYLENOL) 650 MG CR tablet Take 650 mg by mouth every 8 (eight) hours as needed for pain.   Yes [provider]  albuterol (PROVENTIL) (2.5 MG/3ML) 0.083% nebulizer solution Take 3 mLs (2.5 mg total) by nebulization every 6 (six) hours as needed for wheezing or shortness of breath. 01/09/22  Yes Gabriel Earing, FNP  apixaban (ELIQUIS) 5 MG TABS tablet Take 1 tablet (5 mg total) by mouth 2 (two) times daily. Need to schedule an appointment for further refills 08/21/20  Yes Laurey Morale, MD  carvedilol (COREG) 12.5 MG tablet Take 1 tablet by mouth twice daily 08/20/21  Yes Laurey Morale, MD  dapagliflozin propanediol (FARXIGA) 10 MG TABS tablet Take 1 tablet (10 mg total) by mouth daily. 09/19/21  Yes Hawks, Christy A, FNP  fosfomycin (MONUROL) 3 g PACK Take 3gm po every 10 days. 06/13/22  Yes Bjorn Pippin, MD  furosemide (LASIX) 80 MG tablet TAKE 1 TABLET BY MOUTH ONCE DAILY AS NEEDED FOR FLUID Patient taking differently: Take 40 mg by mouth daily. 06/18/22  Yes Hawks, Christy A, FNP  hydrOXYzine (VISTARIL) 25 MG  capsule TAKE 1 CAPSULE BY MOUTH THREE TIMES DAILY Patient taking differently: Take 25 mg by mouth daily as needed for itching. 07/12/21  Yes Hawks, Christy A, FNP  levothyroxine (SYNTHROID) 88 MCG tablet TAKE 1 TABLET BY MOUTH ONCE DAILY BEFORE BREAKFAST Patient taking differently: Take 88 mcg by mouth daily before breakfast. 10/25/21  Yes Hawks, Christy A, FNP  metFORMIN (GLUCOPHAGE-XR) 750 MG 24 hr tablet Take 1 tablet by mouth twice daily 06/19/22  Yes Hawks, Christy A, FNP  pantoprazole (PROTONIX) 40 MG tablet TAKE 1 TABLET BY MOUTH ONCE DAILY BEFORE BREAKFAST Patient taking differently: Take 40 mg by mouth daily as needed (reflux). 07/08/22  Yes Hawks, Christy A, FNP  polyethylene glycol (MIRALAX) 17 g packet Take 17 g by mouth daily. Patient taking differently: Take 17 g by mouth daily as needed for mild constipation or moderate constipation. 04/23/22  Yes Idol, Raynelle Fanning, PA-C  potassium chloride SA (KLOR-CON M) 20 MEQ tablet Take 1 tablet (20 mEq total) by mouth as directed. Only take 1 tablet with extra doses of Lasix 12/04/21  Yes Milford, Jessica M, FNP  spironolactone (ALDACTONE) 25 MG tablet Take 1 tablet by mouth once daily 08/20/21  Yes Bensimhon, Bevelyn Buckles, MD  blood glucose meter kit and supplies 1 each by Other route 2 (two) times daily as needed for other. Dispense based on patient and insurance preference. . (FOR ICD-10 E10.9, E11.9).Dispense based on patient and insurance preference. 07/30/21  Hawks, Christy A, FNP  estradiol (ESTRACE) 0.1 MG/GM vaginal cream 0.5gm vaginally at bedtime 2-3x weekly. Patient not taking: Reported on 07/08/2022 06/13/22   Bjorn Pippin, MD  glucose blood (GLUCOSE METER TEST) test strip Use BID 10/12/21   [name] A, FNP      Allergies    Buspar [buspirone], Lexapro [escitalopram oxalate], Trulicity [dulaglutide], Bactrim, Cephalexin, Chlorhexidine gluconate, Clindamycin, Contrast media [iodinated contrast media], Crestor [rosuvastatin], Doxycycline, Latex,  Levofloxacin, Nitrofurantoin monohyd macro, and Penicillins    Review of Systems   Review of Systems  Respiratory:  Positive for chest tightness and shortness of breath.   Cardiovascular:  Positive for palpitations.  All other systems reviewed and are negative.   Physical Exam Updated Vital Signs BP 94/66   Pulse 72   Temp 97.9 F (36.6 C) (Oral)   Resp 20   SpO2 96%  Physical Exam Vitals and nursing note reviewed.  Constitutional:      General: She is not in acute distress.    Appearance: Normal appearance. She is well-developed. She is not ill-appearing, toxic-appearing or diaphoretic.  HENT:     Head: Normocephalic and atraumatic.     Right Ear: External ear normal.     Left Ear: External ear normal.     Nose: Nose normal.     Mouth/Throat:     Mouth: Mucous membranes are moist.  Eyes:     Extraocular Movements: Extraocular movements intact.     Conjunctiva/sclera: Conjunctivae normal.  Cardiovascular:     Rate and Rhythm: Normal rate and regular rhythm.     Heart sounds: No murmur heard. Pulmonary:     Effort: Pulmonary effort is normal. No respiratory distress.     Breath sounds: Normal breath sounds. No wheezing or rales.  Abdominal:     General: There is no distension.     Palpations: Abdomen is soft.     Tenderness: There is no abdominal tenderness.  Musculoskeletal:        General: No swelling. Normal range of motion.     Cervical back: Normal range of motion and neck supple.     Right lower leg: No edema.     Left lower leg: No edema.  Skin:    General: Skin is warm and dry.     Coloration: Skin is not jaundiced or pale.  Neurological:     General: No focal deficit present.     Mental Status: She is alert and oriented to person, place, and time.  Psychiatric:        Mood and Affect: Mood normal.        Behavior: Behavior normal.        Thought Content: Thought content normal.        Judgment: Judgment normal.     ED Results / Procedures /  Treatments   Labs (all labs ordered are listed, but only abnormal results are displayed) Labs Reviewed  BASIC METABOLIC PANEL - Abnormal; Notable for the following components:      Result Value   Glucose, Bld 141 (*)    Creatinine, Ser 1.09 (*)    Calcium 8.3 (*)    GFR, Estimated 52 (*)    All other components within normal limits  BRAIN NATRIURETIC PEPTIDE - Abnormal; Notable for the following components:   B Natriuretic Peptide 608.0 (*)    All other components within normal limits  CBC WITH DIFFERENTIAL/PLATELET - Abnormal; Notable for the following components:   RBC 3.70 (*)  Hemoglobin 9.1 (*)    HCT 30.0 (*)    MCH 24.6 (*)    RDW 18.6 (*)    All other components within normal limits  PROTIME-INR - Abnormal; Notable for the following components:   Prothrombin Time 18.5 (*)    INR 1.5 (*)    All other components within normal limits  URINALYSIS, ROUTINE W REFLEX MICROSCOPIC - Abnormal; Notable for the following components:   APPearance HAZY (*)    Glucose, UA >=500 (*)    Nitrite POSITIVE (*)    All other components within normal limits  HEPATIC FUNCTION PANEL - Abnormal; Notable for the following components:   AST 13 (*)    All other components within normal limits  CBG MONITORING, ED - Abnormal; Notable for the following components:   Glucose-Capillary 111 (*)    All other components within normal limits  MAGNESIUM  TSH  TROPONIN I (HIGH SENSITIVITY)  TROPONIN I (HIGH SENSITIVITY)    EKG EKG Interpretation  Date/Time:  Monday July 08 2022 08:37:48 EDT Ventricular Rate:  70 PR Interval:  186 QRS Duration: 135 QT Interval:  507 QTC Calculation: 548 R Axis:   -80 Text Interpretation: AV dual-paced rhythm Multiform ventricular premature complexes Nonspecific IVCD with LAD Lateral infarct, old Confirmed by Gloris Manchester [id number]) on 07/08/2022 11:47:49 AM  Radiology DG Chest Portable 1 View  Result Date: 07/08/2022 CLINICAL DATA:  Shortness of breath EXAM:  PORTABLE CHEST 1 VIEW COMPARISON:  CXR 01/23/22 FINDINGS: Left-sided triple lead cardiac device in place. Compared to recent prior chest radiograph the right atrial lead appears slightly retracted. No pleural effusion. No pneumothorax. Mild cardiomegaly. No focal airspace opacity. No radiographically apparent displaced rib fractures. Visualized upper abdomen is unremarkable. IMPRESSION: 1. No acute pulmonary abnormality. 2. Compared to recent prior chest radiograph the right atrial lead appears slightly retracted. Correlate with function and intended placement. 3. Mild cardiomegaly. Electronically Signed   By: Lorenza Cambridge M.D.   On: 07/08/2022 09:12    Procedures Procedures    Medications Ordered in ED Medications  potassium chloride SA (KLOR-CON M) CR tablet 40 mEq (has no administration in time range)  furosemide (LASIX) injection 40 mg (40 mg Intravenous Given 07/08/22 1140)    ED Course/ Medical Decision Making/ A&P                             Medical Decision Making Amount and/or Complexity of Data Reviewed Labs: ordered. Radiology: ordered.  Risk Prescription drug management. Decision regarding hospitalization.   This patient presents to the ED for concern of shortness of breath, this involves an extensive number of treatment options, and is a complaint that carries with it a high risk of complications and morbidity.  The differential diagnosis includes CHF, pneumonia, reactive airway disease, pleural effusion, ACS, valvular dysfunction, anemia   Co morbidities that complicate the patient evaluation  CHF, atrial fibrillation, pacemaker placement, T2DM, anxiety, CKD, anemia   Additional history obtained:  Additional history obtained from N/A External records from outside source obtained and reviewed including EMR   Lab Tests:  I Ordered, and personally interpreted labs.  The pertinent results include: Creatinine is slightly improved from baseline, electrolytes normal,  anemia is baseline, no leukocytosis is present.  Troponin is normal.  BNP is elevated.   Imaging Studies ordered:  I ordered imaging studies including chest x-ray I independently visualized and interpreted imaging which showed possible slight retraction of right atrial lead  pacemaker.  No other acute findings. I agree with the radiologist interpretation   Cardiac Monitoring: / EKG:  The patient was maintained on a cardiac monitor.  I personally viewed and interpreted the cardiac monitored which showed an underlying rhythm of: Paced rhythm  Problem List / ED Course / Critical interventions / Medication management  Patient presents for shortness of breath.  This occurred both yesterday and today.  She describes it as having a difficult time trying in a deep breath.  On arrival, patient is well-appearing.  Her breathing currently appears unlabored.  SpO2 is normal on room air.  She is kept on bedside cardiac monitor.  Workup was initiated.  On cardiac monitor, patient has sustained paced rhythm.  She will have frequent PVCs.  Pacemaker was interrogated which showed similar findings.  Lab work is notable for an elevated BNP.  Currently, patient is taking once daily Lasix.  Dose of IV Lasix was given in the ED.  Initially, when she attempted ambulate, she would have increased work of breathing and hypoxia.  Initial plan was for admission for CHF exacerbation.  While in the ED, she had good urine output following IV Lasix.  She reported improved symptoms.  After being admitted, patient stated that she would prefer to go home given her improved symptoms.  On second trial of ambulation, she did not have significantly increased work of breathing and maintain normal SpO2.  Patient was discharged in stable condition. I ordered medication including Lasix for diuresis Reevaluation of the patient after these medicines showed that the patient improved I have reviewed the patients home medicines and have made  adjustments as needed   Social Determinants of Health:  Has access to outpatient care   Test / Admission - Considered:  Patient was initially admitted but elected to go home following improved symptoms.        Final Clinical Impression(s) / ED Diagnoses Final diagnoses:  Acute on chronic congestive heart failure, unspecified heart failure type [facility])    Rx / DC Orders ED Discharge Orders     None         Gloris Manchester, MD 07/08/22 1721

## 2022-07-08 NOTE — ED Triage Notes (Signed)
Pt from home with reports of "feeling like my heart is fluttering." Pt also reports shortness of breath and trouble sleeping.

## 2022-07-15 ENCOUNTER — Ambulatory Visit: Payer: Medicare Other

## 2022-07-15 DIAGNOSIS — I5022 Chronic systolic (congestive) heart failure: Secondary | ICD-10-CM

## 2022-07-16 ENCOUNTER — Ambulatory Visit: Payer: Medicare Other | Attending: Cardiovascular Disease

## 2022-07-16 DIAGNOSIS — Z95810 Presence of automatic (implantable) cardiac defibrillator: Secondary | ICD-10-CM

## 2022-07-16 DIAGNOSIS — I5022 Chronic systolic (congestive) heart failure: Secondary | ICD-10-CM

## 2022-07-16 NOTE — Progress Notes (Signed)
EPIC Encounter for ICM Monitoring  Patient Name: Melissa Paul is a 77 y.o. female Date: 07/16/2022 Primary Care Physican: Junie Spencer, FNP Primary Cardiologist: [facility] Electrophysiologist: Mealor Bi-V Pacing: 91%        01/01/2022 Weight: 178 lbs 03/15/2022 Weight: 170 lbs 05/24/2022 Weight:  162 lbs 06/25/2022  Weight:  165 lbs 07/16/2022 Weight: 170 lbs      AT/AF Burden: 1.8% (taking Eliquis)         Spoke with patient and heart failure questions reviewed.  Transmission results reviewed.  Pt reports ED visit, 6/17, for fluid symptoms of SOB, fluid in chest/abdomen/leg swelling and weight gain of 7-8 lbs (was 177 lbs).  She was diuresed in ED and returned home.      CorVue Thoracic impedance suggesting possible fluid accumulation from 6/13.     Prescribed:  Furosemide 80 mg Take 80 mg by mouth daily as needed.  Pt takes extra if needed.   06/25/22 She is taking about twice a week due to her body can't handle Furosemide daily.   Potassium 20 mEq take 1 tablet by mouth as directed.  Only take 1 tablet with extra doses of Lasix. Spirolactone 25 mg take 1 tablet by mouth daily Farxiga 5 mg take 1 tablet daily   Labs: 07/08/2022 Creatinine 1.09, BUN 16, Potassium 3.6, Sodium 135, GFR 52 05/04/2022 Creatinine 1.40, BUN 23, Potassium 4.6, Sodium 134, GFR 39  04/23/2022 Creatinine 1.18, BUN 22, Potassium 3.4, Sodium 136, GFR 47  A complete set of results can be found in Results Review.   Recommendations:  Advised to drink close to 64 oz to help with hydration.   Encouraged to call for changes in condition.     Follow-up plan: ICM clinic phone appointment on 08/05/2022.   91 day device clinic remote transmission 10/14/2022.    EP/Cardiology Office Visits:    08/23/2022 with Dr Nelly Laurence.   Copy of ICM check sent to Dr. Nelly Laurence.     3 month ICM trend: 07/16/2022.    12-14 Month ICM trend:     Karie Soda, RN 07/16/2022 2:15 PM

## 2022-07-18 LAB — CUP PACEART REMOTE DEVICE CHECK
Battery Remaining Longevity: 19 mo
Battery Remaining Percentage: 24 %
Battery Voltage: 2.8 V
Brady Statistic AP VP Percent: 76 %
Brady Statistic AP VS Percent: 2.8 %
Brady Statistic AS VP Percent: 5.7 %
Brady Statistic AS VS Percent: 14 %
Brady Statistic RA Percent Paced: 77 %
Date Time Interrogation Session: 20240625020017
HighPow Impedance: 73 Ohm
HighPow Impedance: 73 Ohm
Implantable Lead Connection Status: 753985
Implantable Lead Connection Status: 753985
Implantable Lead Connection Status: 753985
Implantable Lead Implant Date: 20110912
Implantable Lead Implant Date: 20110912
Implantable Lead Implant Date: 20120710
Implantable Lead Location: 753858
Implantable Lead Location: 753859
Implantable Lead Location: 753860
Implantable Lead Model: 7122
Implantable Pulse Generator Implant Date: 20180621
Lead Channel Impedance Value: 380 Ohm
Lead Channel Impedance Value: 450 Ohm
Lead Channel Impedance Value: 540 Ohm
Lead Channel Pacing Threshold Amplitude: 0.625 V
Lead Channel Pacing Threshold Amplitude: 0.75 V
Lead Channel Pacing Threshold Amplitude: 0.75 V
Lead Channel Pacing Threshold Pulse Width: 0.5 ms
Lead Channel Pacing Threshold Pulse Width: 0.5 ms
Lead Channel Pacing Threshold Pulse Width: 0.5 ms
Lead Channel Sensing Intrinsic Amplitude: 11.8 mV
Lead Channel Sensing Intrinsic Amplitude: 3.1 mV
Lead Channel Setting Pacing Amplitude: 2 V
Lead Channel Setting Pacing Amplitude: 2 V
Lead Channel Setting Pacing Amplitude: 2 V
Lead Channel Setting Pacing Pulse Width: 0.5 ms
Lead Channel Setting Pacing Pulse Width: 0.5 ms
Lead Channel Setting Sensing Sensitivity: 0.5 mV
Pulse Gen Serial Number: 9761374
Zone Setting Status: 755011

## 2022-07-21 ENCOUNTER — Encounter (HOSPITAL_COMMUNITY): Payer: Self-pay | Admitting: Emergency Medicine

## 2022-07-21 ENCOUNTER — Emergency Department (HOSPITAL_COMMUNITY)
Admission: EM | Admit: 2022-07-21 | Discharge: 2022-07-22 | Disposition: A | Discharge status: Home / Self Care | Payer: Medicare Other | Attending: Emergency Medicine | Admitting: Emergency Medicine

## 2022-07-21 ENCOUNTER — Other Ambulatory Visit: Payer: Self-pay

## 2022-07-21 ENCOUNTER — Emergency Department (HOSPITAL_COMMUNITY): Payer: Medicare Other

## 2022-07-21 DIAGNOSIS — R0602 Shortness of breath: Secondary | ICD-10-CM | POA: Diagnosis present

## 2022-07-21 DIAGNOSIS — Z7901 Long term (current) use of anticoagulants: Secondary | ICD-10-CM | POA: Insufficient documentation

## 2022-07-21 DIAGNOSIS — Z950 Presence of cardiac pacemaker: Secondary | ICD-10-CM | POA: Diagnosis not present

## 2022-07-21 DIAGNOSIS — Z91040 Latex allergy status: Secondary | ICD-10-CM | POA: Insufficient documentation

## 2022-07-21 DIAGNOSIS — I509 Heart failure, unspecified: Secondary | ICD-10-CM | POA: Diagnosis not present

## 2022-07-21 MED ORDER — FUROSEMIDE 10 MG/ML IJ SOLN
40.0000 mg | Freq: Once | INTRAMUSCULAR | Status: AC
  Administered 2022-07-22: 40 mg via INTRAVENOUS
  Filled 2022-07-21: qty 4

## 2022-07-21 NOTE — ED Triage Notes (Signed)
Pt c/o SOB with hx of CHF, denies weight gain, pt reports was seen for same x 1 week ago

## 2022-07-21 NOTE — ED Provider Notes (Signed)
[place] EMERGENCY DEPARTMENT AT [facility] Provider Note   CSN: [id number] Arrival date & time: 07/21/22  2236     History  Chief Complaint  Patient presents with   Shortness of Breath    Melissa Paul is a 77 y.o. female.  Patient is a 77 year old female with past medical history of nonischemic cardiomyopathy with EF of 25 to 30% based on echocardiogram in October 2023.  She also has history of congestive heart failure, pacemaker placement, hyperlipidemia, paroxysmal A-fib.  Patient presenting today with complaints of shortness of breath.  This is been ongoing for the past several days.  She was seen 1 week ago with similar complaints.  She describes a worsening of her breathing when she lies flat.  She denies any chest pain, fevers, chills, or productive cough.  The history is provided by the patient.       Home Medications Prior to Admission medications   Medication Sig Start Date End Date Taking? Authorizing Provider  acetaminophen (TYLENOL) 650 MG CR tablet Take 650 mg by mouth every 8 (eight) hours as needed for pain.    [provider]  albuterol (PROVENTIL) (2.5 MG/3ML) 0.083% nebulizer solution Take 3 mLs (2.5 mg total) by nebulization every 6 (six) hours as needed for wheezing or shortness of breath. 01/09/22   Gabriel Earing, FNP  apixaban (ELIQUIS) 5 MG TABS tablet Take 1 tablet (5 mg total) by mouth 2 (two) times daily. Need to schedule an appointment for further refills 08/21/20   Laurey Morale, MD  blood glucose meter kit and supplies 1 each by Other route 2 (two) times daily as needed for other. Dispense based on patient and insurance preference. . (FOR ICD-10 E10.9, E11.9).Dispense based on patient and insurance preference. 07/30/21   Junie Spencer, FNP  carvedilol (COREG) 12.5 MG tablet Take 1 tablet by mouth twice daily 08/20/21   Laurey Morale, MD  dapagliflozin propanediol (FARXIGA) 10 MG TABS tablet Take 1 tablet (10 mg total) by  mouth daily. 09/19/21   [name] A, FNP  estradiol (ESTRACE) 0.1 MG/GM vaginal cream 0.5gm vaginally at bedtime 2-3x weekly. Patient not taking: Reported on 07/08/2022 06/13/22   Bjorn Pippin, MD  fosfomycin (MONUROL) 3 g PACK Take 3gm po every 10 days. 06/13/22   Bjorn Pippin, MD  furosemide (LASIX) 80 MG tablet TAKE 1 TABLET BY MOUTH ONCE DAILY AS NEEDED FOR FLUID Patient taking differently: Take 40 mg by mouth daily. 06/18/22   [name] A, FNP  glucose blood (GLUCOSE METER TEST) test strip Use BID 10/12/21   [name] A, FNP  hydrOXYzine (VISTARIL) 25 MG capsule TAKE 1 CAPSULE BY MOUTH THREE TIMES DAILY Patient taking differently: Take 25 mg by mouth daily as needed for itching. 07/12/21   Junie Spencer, FNP  levothyroxine (SYNTHROID) 88 MCG tablet TAKE 1 TABLET BY MOUTH ONCE DAILY BEFORE BREAKFAST Patient taking differently: Take 88 mcg by mouth daily before breakfast. 10/25/21   Junie Spencer, FNP  metFORMIN (GLUCOPHAGE-XR) 750 MG 24 hr tablet Take 1 tablet by mouth twice daily 06/19/22   [name] A, FNP  pantoprazole (PROTONIX) 40 MG tablet TAKE 1 TABLET BY MOUTH ONCE DAILY BEFORE BREAKFAST Patient taking differently: Take 40 mg by mouth daily as needed (reflux). 07/08/22   [name] A, FNP  polyethylene glycol (MIRALAX) 17 g packet Take 17 g by mouth daily. Patient taking differently: Take 17 g by mouth daily as needed for mild constipation  or moderate constipation. 04/23/22   Burgess Amor, PA-C  potassium chloride SA (KLOR-CON M) 20 MEQ tablet Take 1 tablet (20 mEq total) by mouth as directed. Only take 1 tablet with extra doses of Lasix 12/04/21   Jacklynn Ganong, FNP  spironolactone (ALDACTONE) 25 MG tablet Take 1 tablet by mouth once daily 08/20/21   Bensimhon, Bevelyn Buckles, MD      Allergies    Buspar [buspirone], Lexapro [escitalopram oxalate], Trulicity [dulaglutide], Bactrim, Cephalexin, Chlorhexidine gluconate, Clindamycin, Contrast media [iodinated contrast  media], Crestor [rosuvastatin], Doxycycline, Latex, Levofloxacin, Nitrofurantoin monohyd macro, and Penicillins    Review of Systems   Review of Systems  All other systems reviewed and are negative.   Physical Exam Updated Vital Signs BP (!) 115/58 (BP Location: Left Arm)   Pulse 69   Temp 97.6 F (36.4 C) (Oral)   Resp 19   Ht 5\' 2"  (1.575 m)   Wt 77.1 kg   SpO2 100%   BMI 31.09 kg/m  Physical Exam Vitals and nursing note reviewed.  Constitutional:      General: She is not in acute distress.    Appearance: She is well-developed. She is not diaphoretic.  HENT:     Head: Normocephalic and atraumatic.  Cardiovascular:     Rate and Rhythm: Normal rate and regular rhythm.     Heart sounds: No murmur heard.    No friction rub. No gallop.  Pulmonary:     Effort: Pulmonary effort is normal. No respiratory distress.     Breath sounds: Normal breath sounds. No wheezing.  Abdominal:     General: Bowel sounds are normal. There is no distension.     Palpations: Abdomen is soft.     Tenderness: There is no abdominal tenderness.  Musculoskeletal:        General: Normal range of motion.     Cervical back: Normal range of motion and neck supple.  Skin:    General: Skin is warm and dry.  Neurological:     General: No focal deficit present.     Mental Status: She is alert and oriented to person, place, and time.     ED Results / Procedures / Treatments   Labs (all labs ordered are listed, but only abnormal results are displayed) Labs Reviewed  BASIC METABOLIC PANEL  CBC WITH DIFFERENTIAL/PLATELET  BRAIN NATRIURETIC PEPTIDE  TROPONIN I (HIGH SENSITIVITY)    EKG EKG Interpretation Date/Time:  Sunday July 21 2022 22:52:46 EDT Ventricular Rate:  76 PR Interval:  186 QRS Duration:  133 QT Interval:  495 QTC Calculation: 531 R Axis:   -82  Text Interpretation: AV PACED RHYTHM pvcs No significant change since prior 6/24 Confirmed by Meridee Score [id number]) on 07/21/2022  10:56:47 PM  Radiology No results found.  Procedures Procedures  {Document cardiac monitor, telemetry assessment procedure when appropriate:1}  Medications Ordered in ED Medications  furosemide (LASIX) injection 40 mg (has no administration in time range)    ED Course/ Medical Decision Making/ A&P   {   Click here for ABCD2, HEART and other calculatorsREFRESH Note before signing :1}                          Medical Decision Making Amount and/or Complexity of Data Reviewed Labs: ordered. Radiology: ordered.  Risk Prescription drug management.   ***  {Document critical care time when appropriate:1} {Document review of labs and clinical decision tools ie heart score, Chads2Vasc2 etc:1}  {  Document your independent review of radiology images, and any outside records:1} {Document your discussion with family members, caretakers, and with consultants:1} {Document social determinants of health affecting pt's care:1} {Document your decision making why or why not admission, treatments were needed:1} Final Clinical Impression(s) / ED Diagnoses Final diagnoses:  None    Rx / DC Orders ED Discharge Orders     None

## 2022-07-22 DIAGNOSIS — I509 Heart failure, unspecified: Secondary | ICD-10-CM | POA: Diagnosis not present

## 2022-07-22 LAB — TROPONIN I (HIGH SENSITIVITY)
Troponin I (High Sensitivity): 10 ng/L (ref <18)
Troponin I (High Sensitivity): 11 ng/L (ref <18)

## 2022-07-22 LAB — CBC WITH DIFFERENTIAL/PLATELET
Abs Immature Granulocytes: 0.05 10*3/uL (ref 0.00–0.07)
Basophils Absolute: 0 10*3/uL (ref 0.0–0.1)
Basophils Relative: 1 %
Eosinophils Absolute: 0.1 10*3/uL (ref 0.0–0.5)
Eosinophils Relative: 1 %
HCT: 28.6 % — ABNORMAL LOW (ref 36.0–46.0)
Hemoglobin: 8.7 g/dL — ABNORMAL LOW (ref 12.0–15.0)
Immature Granulocytes: 1 %
Lymphocytes Relative: 42 %
Lymphs Abs: 2.1 10*3/uL (ref 0.7–4.0)
MCH: 25.1 pg — ABNORMAL LOW (ref 26.0–34.0)
MCHC: 30.4 g/dL (ref 30.0–36.0)
MCV: 82.7 fL (ref 80.0–100.0)
Monocytes Absolute: 0.5 10*3/uL (ref 0.1–1.0)
Monocytes Relative: 10 %
Neutro Abs: 2.3 10*3/uL (ref 1.7–7.7)
Neutrophils Relative %: 45 %
Platelets: 169 10*3/uL (ref 150–400)
RBC: 3.46 MIL/uL — ABNORMAL LOW (ref 3.87–5.11)
RDW: 17.5 % — ABNORMAL HIGH (ref 11.5–15.5)
WBC: 5 10*3/uL (ref 4.0–10.5)
nRBC: 0 % (ref 0.0–0.2)

## 2022-07-22 LAB — BASIC METABOLIC PANEL
Anion gap: 9 (ref 5–15)
BUN: 13 mg/dL (ref 8–23)
CO2: 23 mmol/L (ref 22–32)
Calcium: 8.9 mg/dL (ref 8.9–10.3)
Chloride: 106 mmol/L (ref 98–111)
Creatinine, Ser: 1.07 mg/dL — ABNORMAL HIGH (ref 0.44–1.00)
GFR, Estimated: 53 mL/min — ABNORMAL LOW (ref >60)
Glucose, Bld: 180 mg/dL — ABNORMAL HIGH (ref 70–99)
Potassium: 3.9 mmol/L (ref 3.5–5.1)
Sodium: 138 mmol/L (ref 135–145)

## 2022-07-22 LAB — BRAIN NATRIURETIC PEPTIDE: B Natriuretic Peptide: 487 pg/mL — ABNORMAL HIGH (ref 0.0–100.0)

## 2022-07-22 NOTE — Discharge Instructions (Signed)
Increase your Lasix to 40 mg twice daily for the next 3 days.  Follow-up with your primary doctor/cardiologist in the next week, and return to the ER if your symptoms significantly worsen or change.

## 2022-07-23 ENCOUNTER — Ambulatory Visit: Payer: Medicare Other | Admitting: Urology

## 2022-08-05 ENCOUNTER — Ambulatory Visit: Payer: Medicare Other | Attending: Cardiovascular Disease

## 2022-08-05 DIAGNOSIS — Z95810 Presence of automatic (implantable) cardiac defibrillator: Secondary | ICD-10-CM | POA: Diagnosis not present

## 2022-08-05 DIAGNOSIS — I5022 Chronic systolic (congestive) heart failure: Secondary | ICD-10-CM | POA: Diagnosis not present

## 2022-08-05 NOTE — Progress Notes (Signed)
 Remote ICD transmission.   

## 2022-08-11 NOTE — Progress Notes (Signed)
EPIC Encounter for ICM Monitoring  Patient Name: Melissa Paul is a 77 y.o. female Date: 08/11/2022 Primary Care Physican: Junie Spencer, FNP Primary Cardiologist: [facility] Electrophysiologist: Mealor Bi-V Pacing: 84%        01/01/2022 Weight: 178 lbs 03/15/2022 Weight: 170 lbs 05/24/2022 Weight:  162 lbs 06/25/2022  Weight:  165 lbs 07/16/2022 Weight: 170 lbs      AT/AF Burden: 1.9% (taking Eliquis)         Transmission results reviewed.  ED visits 6/17 and 6/30.      CorVue Thoracic impedance suggesting intermittent days with possible dryness within last month.     Prescribed:  Furosemide 80 mg Take 80 mg by mouth daily as needed.  Pt takes extra if needed.   06/25/22 She is taking about twice a week due to her body can't handle Furosemide daily.   Potassium 20 mEq take 1 tablet by mouth as directed.  Only take 1 tablet with extra doses of Lasix. Spirolactone 25 mg take 1 tablet by mouth daily   Labs: 07/21/2022 Creatinine 1.07, BUN 13, Potassium 3.9, Sodium 138, GFR 53 07/08/2022 Creatinine 1.09, BUN 16, Potassium 3.6, Sodium 135, GFR 52 05/04/2022 Creatinine 1.40, BUN 23, Potassium 4.6, Sodium 134, GFR 39  04/23/2022 Creatinine 1.18, BUN 22, Potassium 3.4, Sodium 136, GFR 47  A complete set of results can be found in Results Review.   Recommendations:   No changes.    Follow-up plan: ICM clinic phone appointment on 09/09/2022.   91 day device clinic remote transmission 10/14/2022.    EP/Cardiology Office Visits:    08/23/2022 with Dr Nelly Laurence.   Copy of ICM check sent to Dr. Nelly Laurence.     3 month ICM trend: 08/05/2022.    12-14 Month ICM trend:     Karie Soda, RN 08/11/2022 9:55 AM

## 2022-08-13 ENCOUNTER — Encounter: Payer: Self-pay | Admitting: Family

## 2022-08-13 ENCOUNTER — Ambulatory Visit (INDEPENDENT_AMBULATORY_CARE_PROVIDER_SITE_OTHER): Payer: Medicare Other | Admitting: Family

## 2022-08-13 ENCOUNTER — Ambulatory Visit (INDEPENDENT_AMBULATORY_CARE_PROVIDER_SITE_OTHER): Payer: Medicare Other

## 2022-08-13 VITALS — BP 114/70 | HR 62 | Temp 98.7°F | Ht 62.0 in | Wt 164.2 lb

## 2022-08-13 DIAGNOSIS — R140 Abdominal distension (gaseous): Secondary | ICD-10-CM | POA: Diagnosis not present

## 2022-08-13 DIAGNOSIS — F411 Generalized anxiety disorder: Secondary | ICD-10-CM | POA: Diagnosis not present

## 2022-08-13 DIAGNOSIS — G8929 Other chronic pain: Secondary | ICD-10-CM | POA: Diagnosis not present

## 2022-08-13 DIAGNOSIS — R1013 Epigastric pain: Secondary | ICD-10-CM | POA: Diagnosis not present

## 2022-08-13 MED ORDER — HYDROXYZINE PAMOATE 25 MG PO CAPS
25.0000 mg | ORAL_CAPSULE | Freq: Three times a day (TID) | ORAL | 2 refills | Status: AC | PRN
Start: 2022-08-13 — End: ?

## 2022-08-13 NOTE — Progress Notes (Signed)
Subjective:    Patient ID: Melissa Paul, female    DOB: [birth year], 77 y.o.   MRN: [id number]  Chief Complaint  Patient presents with   Bloated   Abdominal Pain    Feeling bloated, hard to breathe. Lip, Tongue sore symptoms started last thurs   PT presents to the office today with abdominal bloating that started last week. Reports it is slightly improved, but continues to feel tight.   Melissa Paul has chronic UTI's and is followed by Urologists. Has follow up with them in two days.  Abdominal Pain This is a new problem. The current episode started 1 to 4 weeks ago. The onset quality is gradual. The problem occurs intermittently. The pain is located in the epigastric region. The pain is at a severity of 5/10. Quality: bloating. Associated symptoms include belching, dysuria and nausea. Pertinent negatives include no constipation, diarrhea, fever, frequency, hematuria or vomiting. Nothing aggravates the pain. The pain is relieved by Passing flatus and belching (rubbing her abdomen). The treatment provided mild relief.  Anxiety Presents for follow-up visit. Symptoms include excessive worry, nausea, nervous/anxious behavior and restlessness. Symptoms occur occasionally. The severity of symptoms is moderate.        Review of Systems  Constitutional:  Negative for fever.  Gastrointestinal:  Positive for abdominal pain and nausea. Negative for constipation, diarrhea and vomiting.  Genitourinary:  Positive for dysuria. Negative for frequency and hematuria.  Psychiatric/Behavioral:  The patient is nervous/anxious.   All other systems reviewed and are negative.      Objective:   Physical Exam Vitals reviewed.  Constitutional:      General: Melissa Paul is not in acute distress.    Appearance: Melissa Paul is well-developed.  HENT:     Head: Normocephalic and atraumatic.     Right Ear: External ear normal.  Eyes:     Pupils: Pupils are equal, round, and reactive to light.  Neck:     Thyroid: No  thyromegaly.  Cardiovascular:     Rate and Rhythm: Normal rate and regular rhythm.     Heart sounds: Normal heart sounds. No murmur heard. Pulmonary:     Effort: Pulmonary effort is normal. No respiratory distress.     Breath sounds: Normal breath sounds. No wheezing.  Abdominal:     General: Bowel sounds are normal. There is no distension.     Palpations: Abdomen is soft.     Tenderness: There is abdominal tenderness in the right upper quadrant, epigastric area and left upper quadrant.  Musculoskeletal:        General: No tenderness. Normal range of motion.     Cervical back: Normal range of motion and neck supple.  Skin:    General: Skin is warm and dry.  Neurological:     Mental Status: Melissa Paul is alert and oriented to person, place, and time.     Cranial Nerves: No cranial nerve deficit.     Motor: Weakness (using rolling walker) present.     Deep Tendon Reflexes: Reflexes are normal and symmetric.  Psychiatric:        Behavior: Behavior normal.        Thought Content: Thought content normal.        Judgment: Judgment normal.    BP 114/70   Pulse 62   Temp 98.7 F (37.1 C) (Temporal)   Ht 5\' 2"  (1.575 m)   Wt 164 lb 3.2 oz (74.5 kg)   SpO2 97%   BMI 30.03 kg/m  Assessment & Plan:   JARELYN BAMBACH comes in today with chief complaint of Bloated and Abdominal Pain (Feeling bloated, hard to breathe. Lip, Tongue sore symptoms started last thurs)   Diagnosis and orders addressed:  1. Abdominal bloating - BMP8+EGFR - CBC with Differential/Platelet - DG Abd 1 View  2. Abdominal pain, chronic, epigastric - BMP8+EGFR - CBC with Differential/Platelet - DG Abd 1 View  3. GAD (generalized anxiety disorder) Take vistaril as needed  Stress management  - hydrOXYzine (VISTARIL) 25 MG capsule; Take 1 capsule (25 mg total) by mouth every 8 (eight) hours as needed.  Dispense: 60 capsule; Refill: 2   Labs pending KUB pending  May need referral to GI if symptoms  do not improve or worsen Continue PPI Start Vistaril   Follow up plan: Keep chronic follow up   [name], FNP

## 2022-08-13 NOTE — Patient Instructions (Signed)
Abdominal Bloating When you have abdominal bloating, your abdomen may feel full, tight, or painful. It may also look bigger than normal or swollen (distended). Common causes of abdominal bloating include: Swallowing air. Constipation. Problems digesting food. Eating too much. Irritable bowel syndrome. This is a condition that affects the large intestine. Lactose intolerance. This is an inability to digest lactose, a natural sugar in dairy products. Celiac disease. This is a condition that affects the ability to digest gluten, a protein found in some grains. Gastroparesis. This is a condition that slows down the movement of food in the stomach and small intestine. It is more common in people with diabetes mellitus. Gastroesophageal reflux disease (GERD). This is a condition that makes stomach acid flow back into the esophagus. Urinary retention. This means that the body is holding onto urine, and the bladder cannot be emptied all the way. Follow these instructions at home: Eating and drinking Avoid eating too much. Try not to swallow air while talking or eating. Avoid eating while lying down. Avoid these foods and drinks: Foods that cause gas, such as broccoli, cabbage, cauliflower, and baked beans. Carbonated drinks. Hard candy. Chewing gum. Medicines Take over-the-counter and prescription medicines only as told by your health care provider. Take probiotic medicines. These medicines contain live bacteria or yeasts that can help digestion. Take coated peppermint oil capsules. General instructions Try to exercise regularly. Exercise may help to relieve bloating that is caused by gas and relieve constipation. Keep all follow-up visits. This is important. Contact a health care provider if: You have nausea and vomiting. You have diarrhea. You have abdominal pain. You have unusual weight loss or weight gain. You have severe pain, and medicines do not help. Get help right away if: You  have chest pain. You have trouble breathing. You have shortness of breath. You have trouble urinating. You have darker urine than normal. You have blood in your stools or have dark, tarry stools. These symptoms may represent a serious problem that is an emergency. Do not wait to see if the symptoms will go away. Get medical help right away. Call your local emergency services (911 in the [place] Do not drive yourself to the hospital. Summary Abdominal bloating means that the abdomen is swollen. Common causes of abdominal bloating are swallowing air, constipation, and problems digesting food. Avoid eating too much and avoid swallowing air. Avoid foods that cause gas, carbonated drinks, hard candy, and chewing gum. This information is not intended to replace advice given to you by your health care provider. Make sure you discuss any questions you have with your health care provider. Document Revised: 08/10/2019 Document Reviewed: 08/10/2019 [employer] Patient Education  2024 [employer]  

## 2022-08-14 LAB — BMP8+EGFR
BUN/Creatinine Ratio: 20 (ref 12–28)
BUN: 30 mg/dL — ABNORMAL HIGH (ref 8–27)
CO2: 25 mmol/L (ref 20–29)
Calcium: 9.5 mg/dL (ref 8.7–10.3)
Chloride: 95 mmol/L — ABNORMAL LOW (ref 96–106)
Creatinine, Ser: 1.52 mg/dL — ABNORMAL HIGH (ref 0.57–1.00)
Glucose: 146 mg/dL — ABNORMAL HIGH (ref 70–99)
Potassium: 3.8 mmol/L (ref 3.5–5.2)
Sodium: 138 mmol/L (ref 134–144)
eGFR: 35 mL/min/{1.73_m2} — ABNORMAL LOW (ref >59)

## 2022-08-14 LAB — CBC WITH DIFFERENTIAL/PLATELET
Basophils Absolute: 0 10*3/uL (ref 0.0–0.2)
Basos: 0 %
EOS (ABSOLUTE): 0 10*3/uL (ref 0.0–0.4)
Eos: 0 %
Hematocrit: 31 % — ABNORMAL LOW (ref 34.0–46.6)
Hemoglobin: 10.1 g/dL — ABNORMAL LOW (ref 11.1–15.9)
Immature Grans (Abs): 0.1 10*3/uL (ref 0.0–0.1)
Immature Granulocytes: 2 %
Lymphocytes Absolute: 1.9 10*3/uL (ref 0.7–3.1)
Lymphs: 32 %
MCH: 24.8 pg — ABNORMAL LOW (ref 26.6–33.0)
MCHC: 32.6 g/dL (ref 31.5–35.7)
MCV: 76 fL — ABNORMAL LOW (ref 79–97)
Monocytes Absolute: 0.6 10*3/uL (ref 0.1–0.9)
Monocytes: 10 %
Neutrophils Absolute: 3.3 10*3/uL (ref 1.4–7.0)
Neutrophils: 56 %
Platelets: 276 10*3/uL (ref 150–450)
RBC: 4.08 x10E6/uL (ref 3.77–5.28)
RDW: 16.3 % — ABNORMAL HIGH (ref 11.7–15.4)
WBC: 5.9 10*3/uL (ref 3.4–10.8)

## 2022-08-14 NOTE — Progress Notes (Unsigned)
Name: Melissa Paul DOB: [birth year] MRN: [id number]  History of Present Illness: Melissa Paul is a 77 y.o. female who presents today for follow up visit at [facility]. - GU history: 1. Recurrent UTI. - Complicated by patient antibiotic allergies including sulfa, PCN, Keflex, Nitrofurantoin, Levaquin, and Doxycycline.  - No history of stones or GU surgery.  - Prior history of pyelonephritis. 2. Vaginal atrophy. - Previously used topical vaginal estrogen cream.  3. Mixed urinary incontinence. 4. Vaginal vault prolapse after hysterectomy. - Failed prior pessary trial (unable to retain it). 5. Stable minimally complex bilateral renal cysts. Per CT stone study on 04/23/2022.  At initial visit with Dr. Annabell Howells on 06/13/2022: - I&O cath UA was negative aside from glucosuria.  - The plan was: 1. Fosfomycin every 10 days for UTI suppression. 2. Topical vaginal estrogen cream use.  3. May consider urodynamics for further evaluation of MUI.   Since last visit: Admitted twice for acute on chronic CHF.  Today: She reports urinary urgency, frequency, nocturia. She reports voiding 7x/day and 2x/night. She reports urge-predominant mixed urinary incontinence. She alternates wearing pads and diapers; goes through several per day.  She reports small amount of daily caffeine intake (soda mainly, usually <8 oz in a 20 oz cup of ice).   She denies dysuria, gross hematuria, straining to void, or sensations of incomplete emptying. Reports malodorous urine.  She reports intermittent right flank / low back pain. She reports constant midline lower abdominal pain. She denies fevers.   She reports history of constipation and bloating. Was evaluated by her PCP on 08/13/2022 and had an abdominal x-ray which is still pending radiology review but appears to show gas in her bowels.   She reports that she did restart using vaginal estrogen cream but it burns sometimes. She reports that her  vaginal bulge is "as big as a fist". Last seen by GYN on 03/15/2021; states she is planning to schedule follow up there soon. She reports a lot of bothersome "pressure" which she thinks is related to prolapse. She denies vaginal bleeding or discharge.   Fall Screening: Do you usually have a device to assist in your mobility? Yes - walker   Medications: Current Outpatient Medications  Medication Sig Dispense Refill   acetaminophen (TYLENOL) 650 MG CR tablet Take 650 mg by mouth every 8 (eight) hours as needed for pain.     albuterol (PROVENTIL) (2.5 MG/3ML) 0.083% nebulizer solution Take 3 mLs (2.5 mg total) by nebulization every 6 (six) hours as needed for wheezing or shortness of breath. 150 mL 1   apixaban (ELIQUIS) 5 MG TABS tablet Take 1 tablet (5 mg total) by mouth 2 (two) times daily. Need to schedule an appointment for further refills 180 tablet 0   blood glucose meter kit and supplies 1 each by Other route 2 (two) times daily as needed for other. Dispense based on patient and insurance preference. . (FOR ICD-10 E10.9, E11.9).Dispense based on patient and insurance preference. 1 each 0   carvedilol (COREG) 12.5 MG tablet Take 1 tablet by mouth twice daily 180 tablet 3   dapagliflozin propanediol (FARXIGA) 10 MG TABS tablet Take 1 tablet (10 mg total) by mouth daily. 90 tablet 6   estradiol (ESTRACE) 0.1 MG/GM vaginal cream 0.5gm vaginally at bedtime 2-3x weekly. 42.5 g 12   fosfomycin (MONUROL) 3 g PACK Take 3gm po every 10 days. 3 each 11   furosemide (LASIX) 80 MG tablet TAKE 1 TABLET BY MOUTH  ONCE DAILY AS NEEDED FOR FLUID (Patient taking differently: Take 40 mg by mouth daily.) 30 tablet 2   glucose blood (GLUCOSE METER TEST) test strip Use BID 100 each 12   hydrOXYzine (VISTARIL) 25 MG capsule Take 1 capsule (25 mg total) by mouth every 8 (eight) hours as needed. 60 capsule 2   levothyroxine (SYNTHROID) 88 MCG tablet TAKE 1 TABLET BY MOUTH ONCE DAILY BEFORE BREAKFAST (Patient taking  differently: Take 88 mcg by mouth daily before breakfast.) 90 tablet 3   metFORMIN (GLUCOPHAGE-XR) 750 MG 24 hr tablet Take 1 tablet by mouth twice daily 180 tablet 0   pantoprazole (PROTONIX) 40 MG tablet TAKE 1 TABLET BY MOUTH ONCE DAILY BEFORE BREAKFAST (Patient taking differently: Take 40 mg by mouth daily as needed (reflux).) 90 tablet 1   polyethylene glycol (MIRALAX) 17 g packet Take 17 g by mouth daily. (Patient taking differently: Take 17 g by mouth daily as needed for mild constipation or moderate constipation.) 14 each 0   potassium chloride SA (KLOR-CON M) 20 MEQ tablet Take 1 tablet (20 mEq total) by mouth as directed. Only take 1 tablet with extra doses of Lasix 30 tablet 3   spironolactone (ALDACTONE) 25 MG tablet Take 1 tablet by mouth once daily 90 tablet 11   No current facility-administered medications for this visit.    Allergies: Allergies  Allergen Reactions   Buspar [Buspirone] Other (See Comments)    Patient states that she became hyper and had lots of itching.   Lexapro [Escitalopram Oxalate] Other (See Comments)    Patient states that she became hyper and lots of itching   Trulicity [Dulaglutide] Hives and Itching    Tongue was numb.   Bactrim Rash   Cephalexin Swelling and Rash    Says she is able to take cefuroxime Throat swelling   Chlorhexidine Gluconate Hives and Itching    Hives, itching after using wipes with last procedure   Clindamycin Rash        Contrast Media [Iodinated Contrast Media] Rash   Crestor [Rosuvastatin] Hives and Rash   Doxycycline Rash   Latex Rash        Levofloxacin Rash        Nitrofurantoin Monohyd Macro Rash        Penicillins Swelling and Rash    Has patient had a PCN reaction causing immediate rash, facial/tongue/throat swelling, SOB or lightheadedness with hypotension: Yes Has patient had a PCN reaction causing severe rash involving mucus membranes or skin necrosis: Yes Has patient had a PCN reaction that required  hospitalization: No Has patient had a PCN reaction occurring within the last 10 years: No Rash/swelled tongue If all of the above answers are "NO", then may proceed with Cephalosporin use.     Past Medical History:  Diagnosis Date   Anxiety    Chronic systolic heart failure (HCC)    Constipation    Cyst of right kidney 11/12/2017   Glucose intolerance (pre-diabetes)    Hyperlipemia    Hypothyroidism    LBBB (left bundle branch block)    Mitral regurgitation    Mild to Moderate   Nonischemic cardiomyopathy (HCC)    LVEF 35-40% by echo 8/13   Paroxysmal atrial fibrillation (HCC)    Brief episodes by device interrogation   UTI (urinary tract infection)    Recurrent   Ventricular tachycardia (HCC)    ICD therapy for VT   Past Surgical History:  Procedure Laterality Date   APPENDECTOMY  BIV ICD GENERATOR CHANGEOUT N/A 07/11/2016   Procedure: BiV ICD Generator Changeout;  Surgeon: Hillis Range, MD;  Location: [facility] INVASIVE CV LAB;  Service: Cardiovascular;  Laterality: N/A;   CARDIAC DEFIBRILLATOR PLACEMENT  09/2009   St. Jude BiV ICD by Dr. Shanna Cisco class III   CHOLECYSTECTOMY     has one ovary left     KNEE ARTHROSCOPY     Left   LEFT AND RIGHT HEART CATHETERIZATION WITH CORONARY ANGIOGRAM N/A 04/12/2014   Procedure: LEFT AND RIGHT HEART CATHETERIZATION WITH CORONARY ANGIOGRAM;  Surgeon: Marykay Lex, MD;  Location: [facility] CATH LAB;  Service: Cardiovascular;  Laterality: N/A;   Left breast biopsy x 2     no maliganancy   PACEMAKER IMPLANT     RIGHT HEART CATH N/A 10/27/2020   Procedure: RIGHT HEART CATH;  Surgeon: Laurey Morale, MD;  Location: [facility] INVASIVE CV LAB;  Service: Cardiovascular;  Laterality: N/A;   RIGHT/LEFT HEART CATH AND CORONARY ANGIOGRAPHY N/A 03/26/2019   Procedure: RIGHT/LEFT HEART CATH AND CORONARY ANGIOGRAPHY;  Surgeon: Laurey Morale, MD;  Location: [facility] INVASIVE CV LAB;  Service: Cardiovascular;  Laterality: N/A;   TOTAL ABDOMINAL HYSTERECTOMY      (R) ovary removed   Family History  Problem Relation Age of Onset   CVA Mother        multiple   Heart Problems Mother        "weak heart"   Heart disease Maternal Grandmother    Heart disease Maternal Grandfather    Cancer Father        Stomach and eshpogus   Diabetes Neg Hx    Hypertension Neg Hx    Coronary artery disease Neg Hx    Social History   Socioeconomic History   Marital status: Widowed    Spouse name: Not on file   Number of children: 2   Years of education: Not on file   Highest education level: Not on file  Occupational History   Not on file  Tobacco Use   Smoking status: Former    Current packs/day: 0.00    Average packs/day: 0.3 packs/day for 46.5 years (13.9 ttl pk-yrs)    Types: Cigarettes    Start date: 08/03/1949    Quit date: 01/22/1996    Years since quitting: 26.5    Passive exposure: Never   Smokeless tobacco: Never  Vaping Use   Vaping status: Never Used  Substance and Sexual Activity   Alcohol use: No    Alcohol/week: 0.0 standard drinks of alcohol   Drug use: No   Sexual activity: Not Currently    Birth control/protection: Surgical    Comment: hyst  Other Topics Concern   Not on file  Social History Narrative   Her daughter, [name] lives with her - takes her to all visits and cooks for her.   Social Determinants of Health   Financial Resource Strain: Low Risk  (07/18/2021)   Overall Financial Resource Strain (CARDIA)    Difficulty of Paying Living Expenses: Not hard at all  Food Insecurity: No Food Insecurity (07/18/2021)   Hunger Vital Sign    Worried About Running Out of Food in the Last Year: Never true    Ran Out of Food in the Last Year: Never true  Transportation Needs: No Transportation Needs (07/18/2021)   PRAPARE - Administrator, Civil Service (Medical): No    Lack of Transportation (Non-Medical): No  Physical Activity: Sufficiently Active (07/18/2021)   Exercise Vital  Sign    Days of Exercise per Week: 5 days     Minutes of Exercise per Session: 30 min  Stress: No Stress Concern Present (07/18/2021)   [employer] of Occupational Health - Occupational Stress Questionnaire    Feeling of Stress : Not at all  Social Connections: Socially Integrated (07/18/2021)   Social Connection and Isolation Panel [NHANES]    Frequency of Communication with Friends and Family: More than three times a week    Frequency of Social Gatherings with Friends and Family: More than three times a week    Attends Religious Services: More than 4 times per year    Active Member of [employer] or Organizations: Yes    Attends Engineer, structural: More than 4 times per year    Marital Status: Married  Catering manager Violence: Not At Risk (07/18/2021)   Humiliation, Afraid, Rape, and Kick questionnaire    Fear of Current or Ex-Partner: No    Emotionally Abused: No    Physically Abused: No    Sexually Abused: No    Review of Systems Constitutional: Patient denies any unintentional weight loss or change in strength lntegumentary: Patient denies any rashes or pruritus Cardiovascular: Patient denies chest pain or syncope Respiratory: Patient denies shortness of breath Gastrointestinal: As per HPI Musculoskeletal: Patient denies muscle cramps or weakness Neurologic: Patient denies convulsions or seizures Psychiatric: Patient denies memory problems Allergic/Immunologic: Patient denies recent allergic reaction(s) Hematologic/Lymphatic: Patient denies bleeding tendencies Endocrine: Patient denies heat/cold intolerance  GU: As per HPI.  OBJECTIVE Vitals:   08/15/22 1026  BP: 113/70  Pulse: 76  Temp: (!) 97.4 F (36.3 C)   There is no height or weight on file to calculate BMI.  Physical Examination  Constitutional: No obvious distress; patient is non-toxic appearing  Cardiovascular: No visible lower extremity edema.  Respiratory: The patient does not have audible wheezing/stridor; respirations do not appear  labored  Gastrointestinal: Abdomen non-distended Musculoskeletal: Normal ROM of UEs  Skin: No obvious rashes/open sores  Neurologic: CN 2-12 grossly intact Psychiatric: Answered questions appropriately with normal affect  Hematologic/Lymphatic/Immunologic: No obvious bruises or sites of spontaneous bleeding  UA: positive for 6-10 WBC/hpf and bacteria (many) PVR: 0 ml  ASSESSMENT Recurrent UTI - Plan: In and Out Cath, Urinalysis, Routine w reflex microscopic, BLADDER SCAN AMB NON-IMAGING, US RENAL, Urine Culture  Vaginal vault prolapse after hysterectomy - Plan: In and Out Cath, Urinalysis, Routine w reflex microscopic, BLADDER SCAN AMB NON-IMAGING, US RENAL  Atrophic vaginitis - Plan: In and Out Cath, Urinalysis, Routine w reflex microscopic, BLADDER SCAN AMB NON-IMAGING  Mixed stress and urge urinary incontinence - Plan: In and Out Cath, Urinalysis, Routine w reflex microscopic, BLADDER SCAN AMB NON-IMAGING  Stage 3b chronic kidney disease (HCC) - Plan: Ambulatory referral to Nephrology  Elevated serum creatinine - Plan: US RENAL, Ambulatory referral to Nephrology  Decreased glomerular filtration rate (GFR) - Plan: Ambulatory referral to Nephrology   We discussed the possible etiologies of recurrent UTls including ascending infection related to intercourse; vaginal atrophy; transmural infection that has been treated incompletely; urinary tract stones; incomplete bladder emptying with urinary stasis (possible related to her prolapse); kidney or bladder tumor; urethral diverticulum; glucosuria due to [place] use for T2DM; and colonization of  vagina and urinary tract with pathologic, adherent organisms.   Abnormal UA. Will check urine culture and treat based on results as she is not highly symptomatic at this time.   For UTI prevention we discussed options including: Adequate fluid intake (>  1.5 liters/day) to flush out the urinary tract. - Go to the bathroom to urinate every 4-6 hours  while awake to minimize urinary stasis / bacterial overgrowth in the bladder. - Proanthocyanidin (PAC) supplement 36 mg daily; must be soluble (insoluble form of PAC will be ineffective). Recommend Ellura. Vitamin C supplement Probiotic to maintain healthy vaginal microbiome - Topical vaginal estrogen for vaginal atrophy. The etiology and consequences of urogenital epithelial atrophy was explained to patient. The thinning of the epithelium of the urethra can contribute to urinary urgency and frequency syndromes. In addition, the normal bacterial flora that colonizes the perineum may contribute to UTI risk because the thin urethral epithelium allows the bacteria to become adherent and the change in vaginal pH can disrupt the vaginal / urethral microbiome and allow for bacterial overgrowth. Patient was advised that topical vaginal estrogen replacement will take about 3 months to restore the vaginal pH and may sting/burn initially due to severe dryness, which will improve with ongoing treatment.  - UTI prophylaxis with Fosfomycin once every 10 days as per Dr. Annabell Howells.   Advised pt not to use D-mannose for UTI prevention due to sugar content given patient's history of diabetes.  We discussed common bladder irritants to avoid such as caffeine, acidic foods, spicy foods, alcohol. We discussed that her urinary frequency is likely exacerbated by her diuretic use (Lasix and Spironalactone).   Due to abnormal renal function on labs drawn 08/13/2022 (GFR 35; creatinine 1.52) advised RUS for further evaluation and referral to nephrology for CKD.   She was advised to follow up with GYN in the near future for prolapse management. May be a good candidate for colpocleisis.  Will plan for follow up in 6 weeks with Dr. Annabell Howells due to complexity. Pt verbalized understanding and agreement. All questions were answered.   PLAN Advised the following: 1. Urine culture.  2. RUS. 3. Referral placed to nephrology. 4. Follow  up with GYN for prolapse management. 5. Continue topical vaginal estrogen cream. 6. Continue with minimal caffeine intake. 7. Continue Fosfomycin every 10 days for UTI prevention. 8. Return in about 6 weeks (around 09/26/2022) for f/u with Dr. Annabell Howells.  Orders Placed This Encounter  Procedures   Urine Culture   US RENAL    Standing Status:   Future    Standing Expiration Date:   08/15/2023    Order Specific Question:   Reason for Exam (SYMPTOM  OR DIAGNOSIS REQUIRED)    Answer:   kidney stone known or suspected    Order Specific Question:   Preferred imaging location?    Answer:   [facility]   Urinalysis, Routine w reflex microscopic   Ambulatory referral to Nephrology    Referral Priority:   Routine    Referral Type:   Consultation    Referral Reason:   Specialty Services Required    Requested Specialty:   Nephrology    Number of Visits Requested:   1   In and Out Cath   BLADDER SCAN AMB NON-IMAGING    It has been explained that the patient is to follow regularly with their PCP in addition to all other providers involved in their care and to follow instructions provided by these respective offices. Patient advised to contact urology clinic if any urologic-pertaining questions, concerns, new symptoms or problems arise in the interim period.  Patient Instructions  Recommendations regarding UTI prevention / management:  When UTI symptoms occur: Call urology office to request order for urine culture. We recommend  waiting for urine culture result prior to use of any antibiotics.  For bladder pain/ burning with urination: - Over the counter Pyridium (phenazopyridine) as needed (commonly known under the "AZO" brand). No more than 3 days consecutively at a time due to risk for methemoglobinemia, liver function issues, and bone health damage with long term use of Pyridium.  Routine use for UTI prevention: - Antibiotic (Fosfomycin) every 10 days for UTI prophylaxis. - Topical  vaginal estrogen for vaginal atrophy. Adequate fluid intake (>1.5 liters/day) to flush out the urinary tract. - Go to the bathroom to urinate every 4-6 hours while awake to minimize urinary stasis / bacterial overgrowth in the bladder. - Proanthocyanidin (PAC) supplement 36 mg daily; must be soluble (insoluble form of PAC will be ineffective). Recommended brand: Ellura. This is an over-the-counter supplement (often must be found/ purchased online) supplement derived from cranberries with concentrated active component: Proanthocyanidin (PAC) 36 mg daily. Decreases bacterial adherence to bladder lining. Not recommended for patients with interstitial cystitis due to acidity. - Vitamin C supplement to acidify urine to minimize bacterial growth. Not recommended for patients with interstitial cystitis due to acidity. - Probiotic to maintain healthy vaginal microbiome to suppress bacteria at urethral opening. Brand recommendations: Darrold Junker (includes probiotic & D-mannose ), Feminine Balance (highest concentration of lactobacillus) or Hyperbiotic Pro 15.  Note for patients with diabetes: You may read about D-mannose powder for UTI prevention. That is an over the-counter supplement which decreases bacterial adherence to bladder lining. I would NOT advise that for you as a person with diabetes due to its sugar content.   Electronically signed by:  Donnita Falls, FNP   08/15/22    12:02 PM

## 2022-08-15 ENCOUNTER — Ambulatory Visit (INDEPENDENT_AMBULATORY_CARE_PROVIDER_SITE_OTHER): Payer: Medicare Other | Admitting: Urology

## 2022-08-15 ENCOUNTER — Encounter: Payer: Self-pay | Admitting: Urology

## 2022-08-15 VITALS — BP 113/70 | HR 76 | Temp 97.4°F

## 2022-08-15 DIAGNOSIS — N952 Postmenopausal atrophic vaginitis: Secondary | ICD-10-CM | POA: Diagnosis not present

## 2022-08-15 DIAGNOSIS — R944 Abnormal results of kidney function studies: Secondary | ICD-10-CM

## 2022-08-15 DIAGNOSIS — N3946 Mixed incontinence: Secondary | ICD-10-CM

## 2022-08-15 DIAGNOSIS — Z87440 Personal history of urinary (tract) infections: Secondary | ICD-10-CM | POA: Diagnosis not present

## 2022-08-15 DIAGNOSIS — N1832 Chronic kidney disease, stage 3b: Secondary | ICD-10-CM

## 2022-08-15 DIAGNOSIS — N390 Urinary tract infection, site not specified: Secondary | ICD-10-CM

## 2022-08-15 DIAGNOSIS — N993 Prolapse of vaginal vault after hysterectomy: Secondary | ICD-10-CM

## 2022-08-15 DIAGNOSIS — R7989 Other specified abnormal findings of blood chemistry: Secondary | ICD-10-CM

## 2022-08-15 LAB — URINALYSIS, ROUTINE W REFLEX MICROSCOPIC
Bilirubin, UA: NEGATIVE
Ketones, UA: NEGATIVE
Leukocytes,UA: NEGATIVE
Nitrite, UA: POSITIVE — AB
Protein,UA: NEGATIVE
RBC, UA: NEGATIVE
Specific Gravity, UA: 1.01 (ref 1.005–1.030)
Urobilinogen, Ur: 0.2 mg/dL (ref 0.2–1.0)
pH, UA: 6 (ref 5.0–7.5)

## 2022-08-15 LAB — MICROSCOPIC EXAMINATION

## 2022-08-15 LAB — BLADDER SCAN AMB NON-IMAGING: Scan Result: 0

## 2022-08-15 NOTE — Patient Instructions (Signed)
Recommendations regarding UTI prevention / management:  When UTI symptoms occur: Call urology office to request order for urine culture. We recommend waiting for urine culture result prior to use of any antibiotics.  For bladder pain/ burning with urination: - Over the counter Pyridium (phenazopyridine) as needed (commonly known under the "AZO" brand). No more than 3 days consecutively at a time due to risk for methemoglobinemia, liver function issues, and bone health damage with long term use of Pyridium.  Routine use for UTI prevention: - Antibiotic (Fosfomycin) every 10 days for UTI prophylaxis. - Topical vaginal estrogen for vaginal atrophy. Adequate fluid intake (>1.5 liters/day) to flush out the urinary tract. - Go to the bathroom to urinate every 4-6 hours while awake to minimize urinary stasis / bacterial overgrowth in the bladder. - Proanthocyanidin (PAC) supplement 36 mg daily; must be soluble (insoluble form of PAC will be ineffective). Recommended brand: Ellura. This is an over-the-counter supplement (often must be found/ purchased online) supplement derived from cranberries with concentrated active component: Proanthocyanidin (PAC) 36 mg daily. Decreases bacterial adherence to bladder lining. Not recommended for patients with interstitial cystitis due to acidity. - Vitamin C supplement to acidify urine to minimize bacterial growth. Not recommended for patients with interstitial cystitis due to acidity. - Probiotic to maintain healthy vaginal microbiome to suppress bacteria at urethral opening. Brand recommendations: Darrold Junker (includes probiotic & D-mannose ), Feminine Balance (highest concentration of lactobacillus) or Hyperbiotic Pro 15.  Note for patients with diabetes: You may read about D-mannose powder for UTI prevention. That is an over the-counter supplement which decreases bacterial adherence to bladder lining. I would NOT advise that for you as a person with diabetes due to its  sugar content.

## 2022-08-15 NOTE — Progress Notes (Signed)
Pt is prepped for and in and out catherization. Patient was cleaned and prepped in a sterle fashion with betadine. A 14 fr catheter foley was inserted. Urine return was note 30 ml.  Performed by Kennyth Lose, CMA  Urine sent LAB

## 2022-08-19 ENCOUNTER — Telehealth: Payer: Self-pay

## 2022-08-19 ENCOUNTER — Other Ambulatory Visit: Payer: Self-pay | Admitting: Family

## 2022-08-19 DIAGNOSIS — N390 Urinary tract infection, site not specified: Secondary | ICD-10-CM

## 2022-08-19 NOTE — Telephone Encounter (Signed)
-----   Message from Donnita Falls sent at 08/19/2022  9:14 AM EDT ----- Please let pt know urine culture result. She has a prescription for Fosfomycin which she is taking every 10 days for UTI prevention - please advise her to take 3 g x1 dose now. She has listed allergies to all other available oral antibiotics which would otherwise address this UTI. Thanks.

## 2022-08-19 NOTE — Progress Notes (Signed)
Please let pt know urine culture result. She has a prescription for Fosfomycin which she is taking every 10 days for UTI prevention - please advise her to take 3 g x1 dose now. She has listed allergies to all other available oral antibiotics which would otherwise address this UTI. Thanks.

## 2022-08-19 NOTE — Telephone Encounter (Signed)
Patient is made aware of Sarah recommendation and voiced understanding. 

## 2022-08-23 ENCOUNTER — Ambulatory Visit: Payer: Medicare Other | Attending: Cardiovascular Disease | Admitting: Cardiovascular Disease

## 2022-08-23 ENCOUNTER — Encounter: Payer: Self-pay | Admitting: Cardiovascular Disease

## 2022-08-23 VITALS — BP 116/68 | HR 71 | Ht 62.5 in

## 2022-08-23 DIAGNOSIS — I480 Paroxysmal atrial fibrillation: Secondary | ICD-10-CM | POA: Diagnosis present

## 2022-08-23 NOTE — Patient Instructions (Signed)
Medication Instructions:  Continue all current medications.  Labwork: none  Testing/Procedures: none  Follow-Up: 3 months   Any Other Special Instructions Will Be Listed Below (If Applicable).  If you need a refill on your cardiac medications before your next appointment, please call your pharmacy.  

## 2022-08-23 NOTE — Progress Notes (Signed)
  Electrophysiology Office Note:    Date:  08/23/2022   ID:  Melissa Paul, DOB May 06, 1945, MRN [id number]  PCP:  Melissa Spencer, FNP   [facility] Providers Cardiologist:  Melissa Dell, MD Electrophysiologist:  Melissa Small, MD     Referring MD: Melissa Spencer, FNP   History of Present Illness:    Melissa Paul is a 77 y.o. 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     She has a Abbott CRT D device in place.  He has had VT/VF in the past.  Additionally, she has a diagnosed with atrial fibrillation  Noted to have atrial lead noise in the past, so her atrial sensing threshold was increased.  On interrogation today, she has under sensed beats.  She has also had inappropriate atrial mode switch episodes because she is still symptomatic noise occasionally.     Since her last visit, she has been doing reasonably well.  She does have occasional palpitations --irregular beats, and sensation of heart racing briefly.  She has noticed increased lower extremity swelling over the past week.  She began taking an extra dose of Lasix in the afternoon as previously instructed.  She feels the fluid is coming off.    EKGs/Labs/Other Studies Reviewed Today:    Echocardiogram:  TTE October 2023 Ejection fraction 25 to 30%.  Grade 2 diastolic dysfunction.  Left atrium moderately dilated.   Monitors:   Stress testing:   Advanced imaging:   Cardiac catherization    EKG:         Physical Exam:    VS:  BP 116/68   Pulse 71   Ht 5' 2.5" (1.588 m)   SpO2 97%   BMI 29.55 kg/m     Wt Readings from Last 3 Encounters:  08/13/22 164 lb 3.2 oz (74.5 kg)  07/21/22 170 lb (77.1 kg)  06/04/22 170 lb 9.6 oz (77.4 kg)     GEN: Well nourished, well developed in no acute distress CARDIAC: RRR, no murmurs, rubs, gallops The device site is normal -- no tenderness, edema,  drainage, redness, threatened erosion.  RESPIRATORY:  Normal work of breathing MUSCULOSKELETAL: 1+ edema    ASSESSMENT & PLAN:    Systolic heart failure CRT D device in place On carvedilol 12.5, dapagliflozin 10, spironolactone 25 She has noticed increased edema, and thoracic impedance is elevated.  She has just begun to take an extra dose of Lasix daily, and she will continue to do this for few more days as her edema improves  Abbott CRT-D Known atrial lead noise leading to false AMS We increased her AV sensitivity threshold today to force atrial pacing Is not device dependent today.  Paroxysmal atrial fibrillation Short episodes noted on interrogation today New Eliquis for CHA2DS2-VASc score of 6 Labs reviewed July 23     Signed, Melissa Small, MD  08/23/2022 11:59 AM    [facility]

## 2022-09-05 ENCOUNTER — Telehealth: Payer: Self-pay

## 2022-09-05 ENCOUNTER — Ambulatory Visit (HOSPITAL_COMMUNITY): Payer: Medicare Other

## 2022-09-05 NOTE — Telephone Encounter (Signed)
Patient called left voice message regarding canceling renal U/S due to having COVID. Patient is made aware to call centralized scheduling to rescheduled renal U/S once she feels better. Patient voiced understanding.

## 2022-09-09 ENCOUNTER — Ambulatory Visit: Payer: Medicare Other

## 2022-09-09 DIAGNOSIS — Z95810 Presence of automatic (implantable) cardiac defibrillator: Secondary | ICD-10-CM

## 2022-09-09 DIAGNOSIS — I5022 Chronic systolic (congestive) heart failure: Secondary | ICD-10-CM | POA: Diagnosis not present

## 2022-09-11 ENCOUNTER — Ambulatory Visit: Payer: Medicare Other

## 2022-09-11 VITALS — Ht 62.0 in | Wt 164.0 lb

## 2022-09-11 DIAGNOSIS — Z0000 Encounter for general adult medical examination without abnormal findings: Secondary | ICD-10-CM

## 2022-09-11 DIAGNOSIS — Z0100 Encounter for examination of eyes and vision without abnormal findings: Secondary | ICD-10-CM

## 2022-09-11 DIAGNOSIS — Z780 Asymptomatic menopausal state: Secondary | ICD-10-CM

## 2022-09-11 NOTE — Progress Notes (Signed)
EPIC Encounter for ICM Monitoring  Patient Name: Melissa Paul is a 77 y.o. female Date: 09/11/2022 Primary Care Physican: Junie Spencer, FNP Primary Cardiologist: [place] Electrophysiologist: Mealor Bi-V Pacing: 84%        01/01/2022 Weight: 178 lbs 03/15/2022 Weight: 170 lbs 05/24/2022 Weight:  162 lbs 06/25/2022  Weight:  165 lbs 07/16/2022 Weight: 170 lbs      AT/AF Burden: 1.9% (taking Eliquis)         Spoke with patient and heart failure questions reviewed.  Transmission results reviewed.  Pt asymptomatic for fluid accumulation.  Still taking antibiotics for chronic UTI.      CorVue Thoracic impedance suggesting intermittent days with possible dryness within last month.  7/25-8/3 and 8/11-8/14.   Prescribed:  Furosemide 80 mg Take 80 mg by mouth daily as needed.  Pt takes extra if needed.   06/25/22 She is taking about twice a week due to her body can't handle Furosemide daily.   Potassium 20 mEq take 1 tablet by mouth as directed.  Only take 1 tablet with extra doses of Lasix. Spirolactone 25 mg take 1 tablet by mouth daily   Labs: 08/13/2022 Creatinine 1.52, BUN 30, Potassium 3.8, Sodium 138, GFR 35 07/21/2022 Creatinine 1.07, BUN 13, Potassium 3.9, Sodium 138, GFR 53 07/08/2022 Creatinine 1.09, BUN 16, Potassium 3.6, Sodium 135, GFR 52 05/04/2022 Creatinine 1.40, BUN 23, Potassium 4.6, Sodium 134, GFR 39  04/23/2022 Creatinine 1.18, BUN 22, Potassium 3.4, Sodium 136, GFR 47  A complete set of results can be found in Results Review.   Recommendations:  No changes and encouraged to call if experiencing any fluid symptoms.   Follow-up plan: ICM clinic phone appointment on 10/15/2022.   91 day device clinic remote transmission 10/14/2022.    EP/Cardiology Office Visits:   11/29/2022 with Dr Nelly Laurence.  11/18/2022 with Dr Diona Browner.     Copy of ICM check sent to Dr. Nelly Laurence.      3 month ICM trend: 09/09/2022.    12-14 Month ICM trend:     Karie Soda,  RN 09/11/2022 2:47 PM

## 2022-09-11 NOTE — Progress Notes (Signed)
Subjective:   Melissa Paul is a 77 y.o. female who presents for Medicare Annual (Subsequent) preventive examination.  Visit Complete: Virtual  I connected with  Gerlene Burdock on 09/11/22 by a audio enabled telemedicine application and verified that I am speaking with the correct person using two identifiers.  Patient Location: Home  Provider Location: Home Office  I discussed the limitations of evaluation and management by telemedicine. The patient expressed understanding and agreed to proceed.  Patient Medicare AWV questionnaire was completed by the patient on 09/11/2022; I have confirmed that all information answered by patient is correct and no changes since this date.  Review of Systems    Vital Signs: Unable to obtain new vitals due to this being a telehealth visit.  Cardiac Risk Factors include: advanced age (>73men, >8 women);diabetes mellitus;dyslipidemia;hypertensionNutrition Risk Assessment:  Has the patient had any N/V/D within the last 2 months?  No  Does the patient have any non-healing wounds?  No  Has the patient had any unintentional weight loss or weight gain?  No   Diabetes:  Is the patient diabetic?  Yes  If diabetic, was a CBG obtained today?  No  Did the patient bring in their glucometer from home?  No  How often do you monitor your CBG's? Daily .   Financial Strains and Diabetes Management:  Are you having any financial strains with the device, your supplies or your medication? No .  Does the patient want to be seen by Chronic Care Management for management of their diabetes?  No  Would the patient like to be referred to a Nutritionist or for Diabetic Management?  No   Diabetic Exams:  Diabetic Eye Exam: Overdue for diabetic eye exam. Pt has been advised about the importance in completing this exam. Patient advised to call and schedule an eye exam. Diabetic Foot Exam: Overdue, Pt has been advised about the importance in completing this exam. Pt  is scheduled for diabetic foot exam on next office visit .      Objective:    Today's Vitals   09/11/22 1100  Weight: 164 lb (74.4 kg)  Height: 5\' 2"  (1.575 m)   Body mass index is 30 kg/m.     09/11/2022   11:05 AM 07/21/2022   10:51 PM 07/08/2022    8:37 AM 05/04/2022    3:08 PM 04/23/2022    6:46 PM 04/23/2022    1:53 PM 07/18/2021   10:39 AM  Advanced Directives  Does Patient Have a Medical Advance Directive? No Yes Yes No No No Yes  Type of Science writer of [employer] Power of [place];Living will  Copy of Healthcare Power of Attorney in Chart?  No - copy requested     No - copy requested  Would patient like information on creating a medical advance directive? Yes (MAU/Ambulatory/Procedural Areas - Information given) No - Patient declined  No - Patient declined  No - Patient declined     Current Medications (verified) Outpatient Encounter Medications as of 09/11/2022  Medication Sig   acetaminophen (TYLENOL) 650 MG CR tablet Take 650 mg by mouth every 8 (eight) hours as needed for pain.   albuterol (PROVENTIL) (2.5 MG/3ML) 0.083% nebulizer solution Take 3 mLs (2.5 mg total) by nebulization every 6 (six) hours as needed for wheezing or shortness of breath.   apixaban (ELIQUIS) 5 MG TABS tablet Take 1 tablet (5 mg total) by mouth 2 (two)  times daily. Need to schedule an appointment for further refills   blood glucose meter kit and supplies 1 each by Other route 2 (two) times daily as needed for other. Dispense based on patient and insurance preference. . (FOR ICD-10 E10.9, E11.9).Dispense based on patient and insurance preference.   carvedilol (COREG) 12.5 MG tablet Take 1 tablet by mouth twice daily   dapagliflozin propanediol (FARXIGA) 10 MG TABS tablet Take 1 tablet (10 mg total) by mouth daily.   estradiol (ESTRACE) 0.1 MG/GM vaginal cream 0.5gm vaginally at bedtime 2-3x weekly.   fosfomycin (MONUROL) 3 g PACK Take 3gm  po every 10 days.   furosemide (LASIX) 80 MG tablet TAKE 1 TABLET BY MOUTH ONCE DAILY AS NEEDED FOR FLUID (Patient taking differently: Take 40 mg by mouth daily.)   glucose blood (GLUCOSE METER TEST) test strip Use BID   hydrOXYzine (VISTARIL) 25 MG capsule Take 1 capsule (25 mg total) by mouth every 8 (eight) hours as needed.   levothyroxine (SYNTHROID) 88 MCG tablet TAKE 1 TABLET BY MOUTH ONCE DAILY BEFORE BREAKFAST (Patient taking differently: Take 88 mcg by mouth daily before breakfast.)   metFORMIN (GLUCOPHAGE-XR) 750 MG 24 hr tablet Take 1 tablet by mouth twice daily   pantoprazole (PROTONIX) 40 MG tablet TAKE 1 TABLET BY MOUTH ONCE DAILY BEFORE BREAKFAST (Patient taking differently: Take 40 mg by mouth daily as needed (reflux).)   polyethylene glycol (MIRALAX) 17 g packet Take 17 g by mouth daily. (Patient taking differently: Take 17 g by mouth daily as needed for mild constipation or moderate constipation.)   potassium chloride SA (KLOR-CON M) 20 MEQ tablet Take 1 tablet (20 mEq total) by mouth as directed. Only take 1 tablet with extra doses of Lasix   spironolactone (ALDACTONE) 25 MG tablet Take 1 tablet by mouth once daily   No facility-administered encounter medications on file as of 09/11/2022.    Allergies (verified) Buspar [buspirone], Lexapro [escitalopram oxalate], Trulicity [dulaglutide], Bactrim, Cephalexin, Chlorhexidine gluconate, Clindamycin, Contrast media [iodinated contrast media], Crestor [rosuvastatin], Doxycycline, Latex, Levofloxacin, Nitrofurantoin monohyd macro, and Penicillins   History: Past Medical History:  Diagnosis Date   Anxiety    Chronic systolic heart failure (HCC)    Constipation    Cyst of right kidney 11/12/2017   Glucose intolerance (pre-diabetes)    Hyperlipemia    Hypothyroidism    LBBB (left bundle branch block)    Mitral regurgitation    Mild to Moderate   Nonischemic cardiomyopathy (HCC)    LVEF 35-40% by echo 8/13   Paroxysmal atrial  fibrillation (HCC)    Brief episodes by device interrogation   UTI (urinary tract infection)    Recurrent   Ventricular tachycardia (HCC)    ICD therapy for VT   Past Surgical History:  Procedure Laterality Date   APPENDECTOMY     BIV ICD GENERATOR CHANGEOUT N/A 07/11/2016   Procedure: BiV ICD Generator Changeout;  Surgeon: Hillis Range, MD;  Location: [facility] INVASIVE CV LAB;  Service: Cardiovascular;  Laterality: N/A;   CARDIAC DEFIBRILLATOR PLACEMENT  09/2009   St. Jude BiV ICD by Dr. Shanna Cisco class III   CHOLECYSTECTOMY     has one ovary left     KNEE ARTHROSCOPY     Left   LEFT AND RIGHT HEART CATHETERIZATION WITH CORONARY ANGIOGRAM N/A 04/12/2014   Procedure: LEFT AND RIGHT HEART CATHETERIZATION WITH CORONARY ANGIOGRAM;  Surgeon: Marykay Lex, MD;  Location: [facility] CATH LAB;  Service: Cardiovascular;  Laterality: N/A;   Left breast  biopsy x 2     no maliganancy   PACEMAKER IMPLANT     RIGHT HEART CATH N/A 10/27/2020   Procedure: RIGHT HEART CATH;  Surgeon: Laurey Morale, MD;  Location: [facility] INVASIVE CV LAB;  Service: Cardiovascular;  Laterality: N/A;   RIGHT/LEFT HEART CATH AND CORONARY ANGIOGRAPHY N/A 03/26/2019   Procedure: RIGHT/LEFT HEART CATH AND CORONARY ANGIOGRAPHY;  Surgeon: Laurey Morale, MD;  Location: [facility] INVASIVE CV LAB;  Service: Cardiovascular;  Laterality: N/A;   TOTAL ABDOMINAL HYSTERECTOMY     (R) ovary removed   Family History  Problem Relation Age of Onset   CVA Mother        multiple   Heart Problems Mother        "weak heart"   Heart disease Maternal Grandmother    Heart disease Maternal Grandfather    Cancer Father        Stomach and eshpogus   Diabetes Neg Hx    Hypertension Neg Hx    Coronary artery disease Neg Hx    Social History   Socioeconomic History   Marital status: Widowed    Spouse name: Not on file   Number of children: 2   Years of education: Not on file   Highest education level: Not on file  Occupational History   Not on file   Tobacco Use   Smoking status: Former    Current packs/day: 0.00    Average packs/day: 0.3 packs/day for 46.5 years (13.9 ttl pk-yrs)    Types: Cigarettes    Start date: 08/03/1949    Quit date: 01/22/1996    Years since quitting: 26.6    Passive exposure: Never   Smokeless tobacco: Never  Vaping Use   Vaping status: Never Used  Substance and Sexual Activity   Alcohol use: No    Alcohol/week: 0.0 standard drinks of alcohol   Drug use: No   Sexual activity: Not Currently    Birth control/protection: Surgical    Comment: hyst  Other Topics Concern   Not on file  Social History Narrative   Her daughter, [name] lives with her - takes her to all visits and cooks for her.   Social Determinants of Health   Financial Resource Strain: Low Risk  (09/11/2022)   Overall Financial Resource Strain (CARDIA)    Difficulty of Paying Living Expenses: Not hard at all  Food Insecurity: No Food Insecurity (09/11/2022)   Hunger Vital Sign    Worried About Running Out of Food in the Last Year: Never true    Ran Out of Food in the Last Year: Never true  Transportation Needs: No Transportation Needs (09/11/2022)   PRAPARE - Administrator, Civil Service (Medical): No    Lack of Transportation (Non-Medical): No  Physical Activity: Inactive (09/11/2022)   Exercise Vital Sign    Days of Exercise per Week: 0 days    Minutes of Exercise per Session: 0 min  Stress: No Stress Concern Present (09/11/2022)   [employer] of Occupational Health - Occupational Stress Questionnaire    Feeling of Stress : Not at all  Social Connections: Moderately Isolated (09/11/2022)   Social Connection and Isolation Panel [NHANES]    Frequency of Communication with Friends and Family: More than three times a week    Frequency of Social Gatherings with Friends and Family: More than three times a week    Attends Religious Services: 1 to 4 times per year    Active Member of [employer]  or Organizations: No    Attends  Banker Meetings: Never    Marital Status: Widowed    Tobacco Counseling Counseling given: Not Answered   Clinical Intake:  Pre-visit preparation completed: Yes  Pain : No/denies pain     Nutritional Risks: None Diabetes: Yes CBG done?: No Did pt. bring in CBG monitor from home?: No  How often do you need to have someone help you when you read instructions, pamphlets, or other written materials from your doctor or pharmacy?: 1 - Never  Interpreter Needed?: No  Information entered by :: Renie Ora, LPN   Activities of Daily Living    09/11/2022   11:05 AM  In your present state of health, do you have any difficulty performing the following activities:  Hearing? 0  Vision? 0  Difficulty concentrating or making decisions? 0  Walking or climbing stairs? 0  Dressing or bathing? 0  Doing errands, shopping? 0  Preparing Food and eating ? N  Using the Toilet? N  In the past six months, have you accidently leaked urine? N  Do you have problems with loss of bowel control? N  Managing your Medications? N  Managing your Finances? N  Housekeeping or managing your Housekeeping? N    Patient Care Team: Junie Spencer, FNP as PCP - General (Family Medicine) [name], MD as PCP - Cardiology (Cardiology) Mealor, Roberts Gaudy, MD as PCP - Electrophysiology (Cardiology) Danella Maiers, [facility] as Pharmacist (Family Medicine) Michaelle Copas, MD as Referring Physician (Optometry)  Indicate any recent Medical Services you may have received from other than [facility] providers in the past year (date may be approximate).     Assessment:   This is a routine wellness examination for [place].  Hearing/Vision screen Vision Screening - Comments:: Referral 09/11/2022  Dietary issues and exercise activities discussed:     Goals Addressed             This Visit's Progress    Have 3 meals a day   On track      Depression Screen    09/11/2022   11:04 AM  06/04/2022   11:02 AM 04/04/2022    2:49 PM 01/23/2022    4:35 PM 01/09/2022   10:18 AM 10/12/2021    3:54 PM 08/27/2021   11:11 AM  PHQ 2/9 Scores  PHQ - 2 Score 0 0 0 0 0 0 0  PHQ- 9 Score  0 0 0       Fall Risk    09/11/2022   11:02 AM 06/04/2022   11:04 AM 04/04/2022    2:48 PM 01/23/2022    4:34 PM 01/09/2022   10:18 AM  Fall Risk   Falls in the past year? 1 0 0 1 0  Number falls in past yr: 1 0 0 0 0  Injury with Fall? 1 0 0 0 0  Risk for fall due to : History of fall(s);Impaired balance/gait;Orthopedic patient No Fall Risks No Fall Risks History of fall(s) No Fall Risks  Follow up Education provided;Falls prevention discussed;Falls evaluation completed Falls evaluation completed Falls evaluation completed Falls evaluation completed Education provided    MEDICARE RISK AT HOME: Medicare Risk at Home Any stairs in or around the home?: No If so, are there any without handrails?: No Home free of loose throw rugs in walkways, pet beds, electrical cords, etc?: Yes Adequate lighting in your home to reduce risk of falls?: Yes Life alert?: No Use of a  cane, walker or w/c?: No Grab bars in the bathroom?: Yes Shower chair or bench in shower?: Yes Elevated toilet seat or a handicapped toilet?: Yes  TIMED UP AND GO:  Was the test performed?  No    Cognitive Function:        09/11/2022   11:05 AM 07/18/2021   10:44 AM 04/21/2019    8:33 AM  6CIT Screen  What Year? 0 points 0 points 0 points  What month? 0 points 0 points 0 points  What time? 0 points 0 points 0 points  Count back from 20 0 points 0 points 0 points  Months in reverse 0 points 0 points 0 points  Repeat phrase 0 points 0 points   Total Score 0 points 0 points     Immunizations Immunization History  Administered Date(s) Administered   Influenza,inj,Quad PF,6+ Mos 11/12/2017   Influenza-Unspecified 11/11/2008, 01/08/2011   Pneumococcal Polysaccharide-23 07/11/2009    TDAP status: Due, Education has been  provided regarding the importance of this vaccine. Advised may receive this vaccine at local pharmacy or Health Dept. Aware to provide a copy of the vaccination record if obtained from local pharmacy or Health Dept. Verbalized acceptance and understanding.  Flu Vaccine status: Declined, Education has been provided regarding the importance of this vaccine but patient still declined. Advised may receive this vaccine at local pharmacy or Health Dept. Aware to provide a copy of the vaccination record if obtained from local pharmacy or Health Dept. Verbalized acceptance and understanding.  Pneumococcal vaccine status: Due, Education has been provided regarding the importance of this vaccine. Advised may receive this vaccine at local pharmacy or Health Dept. Aware to provide a copy of the vaccination record if obtained from local pharmacy or Health Dept. Verbalized acceptance and understanding.  Covid-19 vaccine status: Declined, Education has been provided regarding the importance of this vaccine but patient still declined. Advised may receive this vaccine at local pharmacy or Health Dept.or vaccine clinic. Aware to provide a copy of the vaccination record if obtained from local pharmacy or Health Dept. Verbalized acceptance and understanding.  Qualifies for Shingles Vaccine? Yes   Zostavax completed No   Shingrix Completed?: No.    Education has been provided regarding the importance of this vaccine. Patient has been advised to call insurance company to determine out of pocket expense if they have not yet received this vaccine. Advised may also receive vaccine at local pharmacy or Health Dept. Verbalized acceptance and understanding.  Screening Tests Health Maintenance  Topic Date Due   COVID-19 Vaccine (1) Never done   Zoster Vaccines- Shingrix (1 of 2) Never done   DEXA SCAN  11/14/2021   Diabetic kidney evaluation - Urine ACR  03/09/2022   OPHTHALMOLOGY EXAM  07/05/2022   MAMMOGRAM  08/22/2022    INFLUENZA VACCINE  08/22/2022   FOOT EXAM  08/28/2022   Pneumonia Vaccine 7+ Years old (2 of 2 - PCV) 04/23/2023 (Originally 07/12/2010)   HEMOGLOBIN A1C  12/05/2022   Diabetic kidney evaluation - eGFR measurement  08/13/2023   Medicare Annual Wellness (AWV)  09/11/2023   Hepatitis C Screening  Completed   HPV VACCINES  Aged Out   DTaP/Tdap/Td  Discontinued    Health Maintenance  Health Maintenance Due  Topic Date Due   COVID-19 Vaccine (1) Never done   Zoster Vaccines- Shingrix (1 of 2) Never done   DEXA SCAN  11/14/2021   Diabetic kidney evaluation - Urine ACR  03/09/2022   OPHTHALMOLOGY EXAM  07/05/2022   MAMMOGRAM  08/22/2022   INFLUENZA VACCINE  08/22/2022   FOOT EXAM  08/28/2022    Colorectal cancer screening: No longer required.   Mammogram status: No longer required due to age.  Bone Density status: Ordered 09/11/2022. Pt provided with contact info and advised to call to schedule appt.  Lung Cancer Screening: (Low Dose CT Chest recommended if Age 72-80 years, 20 pack-year currently smoking OR have quit w/in 15years.) does not qualify.   Lung Cancer Screening Referral: n/a  Additional Screening:  Hepatitis C Screening: does not qualify; Completed 05/07/2018  Vision Screening: Recommended annual ophthalmology exams for early detection of glaucoma and other disorders of the eye. Is the patient up to date with their annual eye exam?  No  Who is the provider or what is the name of the office in which the patient attends annual eye exams? Referral 09/11/2022 If pt is not established with a provider, would they like to be referred to a provider to establish care? No .   Dental Screening: Recommended annual dental exams for proper oral hygiene   Community Resource Referral / Chronic Care Management: CRR required this visit?  No   CCM required this visit?  No     Plan:     I have personally reviewed and noted the following in the patient's chart:   Medical  and social history Use of alcohol, tobacco or illicit drugs  Current medications and supplements including opioid prescriptions. Patient is not currently taking opioid prescriptions. Functional ability and status Nutritional status Physical activity Advanced directives List of other physicians Hospitalizations, surgeries, and ER visits in previous 12 months Vitals Screenings to include cognitive, depression, and falls Referrals and appointments  In addition, I have reviewed and discussed with patient certain preventive protocols, quality metrics, and best practice recommendations. A written personalized care plan for preventive services as well as general preventive health recommendations were provided to patient.     Lorrene Reid, LPN   1/61/0960   After Visit Summary: (MyChart) Due to this being a telephonic visit, the after visit summary with patients personalized plan was offered to patient via MyChart   Nurse Notes: Due TDAp /Pneumonia Vaccine

## 2022-09-11 NOTE — Patient Instructions (Signed)
Ms. Burdette , Thank you for taking time to come for your Medicare Wellness Visit. I appreciate your ongoing commitment to your health goals. Please review the following plan we discussed and let me know if I can assist you in the future.   Referrals/Orders/Follow-Ups/Clinician Recommendations: Aim for 30 minutes of exercise or brisk walking, 6-8 glasses of water, and 5 servings of fruits and vegetables each day.   This is a list of the screening recommended for you and due dates:  Health Maintenance  Topic Date Due   COVID-19 Vaccine (1) Never done   Zoster (Shingles) Vaccine (1 of 2) Never done   DEXA scan (bone density measurement)  11/14/2021   Yearly kidney health urinalysis for diabetes  03/09/2022   Eye exam for diabetics  07/05/2022   Mammogram  08/22/2022   Flu Shot  08/22/2022   Complete foot exam   08/28/2022   Pneumonia Vaccine (2 of 2 - PCV) 04/23/2023*   Hemoglobin A1C  12/05/2022   Yearly kidney function blood test for diabetes  08/13/2023   Medicare Annual Wellness Visit  09/11/2023   Hepatitis C Screening  Completed   HPV Vaccine  Aged Out   DTaP/Tdap/Td vaccine  Discontinued  *Topic was postponed. The date shown is not the original due date.    Advanced directives: (Copy Requested) Please bring a copy of your health care power of attorney and living will to the office to be added to your chart at your convenience.  Next Medicare Annual Wellness Visit scheduled for next year: Yes  Insert Preventive Care attachment Insert FALL PREVENTION attachment if needed

## 2022-09-18 ENCOUNTER — Other Ambulatory Visit: Payer: Self-pay | Admitting: Family

## 2022-09-21 ENCOUNTER — Other Ambulatory Visit: Payer: Self-pay | Admitting: Family

## 2022-10-01 ENCOUNTER — Ambulatory Visit: Payer: Medicare Other

## 2022-10-03 ENCOUNTER — Ambulatory Visit: Payer: Medicare Other | Admitting: Urology

## 2022-10-08 ENCOUNTER — Ambulatory Visit: Payer: Medicare Other

## 2022-10-11 ENCOUNTER — Other Ambulatory Visit: Payer: Self-pay

## 2022-10-11 DIAGNOSIS — R921 Mammographic calcification found on diagnostic imaging of breast: Secondary | ICD-10-CM

## 2022-10-11 DIAGNOSIS — Z1231 Encounter for screening mammogram for malignant neoplasm of breast: Secondary | ICD-10-CM

## 2022-10-14 ENCOUNTER — Ambulatory Visit: Payer: Medicare Other

## 2022-10-15 ENCOUNTER — Ambulatory Visit: Payer: Medicare Other | Attending: Cardiovascular Disease

## 2022-10-15 DIAGNOSIS — I5022 Chronic systolic (congestive) heart failure: Secondary | ICD-10-CM | POA: Diagnosis not present

## 2022-10-15 DIAGNOSIS — Z95810 Presence of automatic (implantable) cardiac defibrillator: Secondary | ICD-10-CM

## 2022-10-19 ENCOUNTER — Other Ambulatory Visit (HOSPITAL_COMMUNITY): Payer: Self-pay | Admitting: Cardiology

## 2022-10-19 ENCOUNTER — Other Ambulatory Visit (HOSPITAL_COMMUNITY): Payer: Self-pay | Admitting: Internal Medicine

## 2022-10-19 ENCOUNTER — Other Ambulatory Visit: Payer: Self-pay | Admitting: Family

## 2022-10-21 LAB — CUP PACEART REMOTE DEVICE CHECK
Battery Remaining Longevity: 14 mo
Battery Remaining Percentage: 18 %
Battery Voltage: 2.75 V
Brady Statistic AP VP Percent: 85 %
Brady Statistic AP VS Percent: 2 %
Brady Statistic AS VP Percent: 2.1 %
Brady Statistic AS VS Percent: 9.5 %
Brady Statistic RA Percent Paced: 86 %
Date Time Interrogation Session: 20240927163744
HighPow Impedance: 68 Ohm
HighPow Impedance: 68 Ohm
Implantable Lead Connection Status: 753985
Implantable Lead Connection Status: 753985
Implantable Lead Connection Status: 753985
Implantable Lead Implant Date: 20110912
Implantable Lead Implant Date: 20110912
Implantable Lead Implant Date: 20120710
Implantable Lead Location: 753858
Implantable Lead Location: 753859
Implantable Lead Location: 753860
Implantable Lead Model: 7122
Implantable Pulse Generator Implant Date: 20180621
Lead Channel Impedance Value: 310 Ohm
Lead Channel Impedance Value: 460 Ohm
Lead Channel Impedance Value: 480 Ohm
Lead Channel Pacing Threshold Amplitude: 0.625 V
Lead Channel Pacing Threshold Amplitude: 0.75 V
Lead Channel Pacing Threshold Amplitude: 0.75 V
Lead Channel Pacing Threshold Pulse Width: 0.5 ms
Lead Channel Pacing Threshold Pulse Width: 0.5 ms
Lead Channel Pacing Threshold Pulse Width: 0.5 ms
Lead Channel Sensing Intrinsic Amplitude: 12 mV
Lead Channel Sensing Intrinsic Amplitude: 2.5 mV
Lead Channel Setting Pacing Amplitude: 2 V
Lead Channel Setting Pacing Amplitude: 2 V
Lead Channel Setting Pacing Amplitude: 2 V
Lead Channel Setting Pacing Pulse Width: 0.5 ms
Lead Channel Setting Pacing Pulse Width: 0.5 ms
Lead Channel Setting Sensing Sensitivity: 0.5 mV
Pulse Gen Serial Number: 9761374
Zone Setting Status: 755011

## 2022-10-21 NOTE — Telephone Encounter (Signed)
[facility] pt NTBS 30-d given 09/24/22

## 2022-10-21 NOTE — Telephone Encounter (Signed)
Appt scheduled for 01/21/2022

## 2022-10-22 ENCOUNTER — Ambulatory Visit (INDEPENDENT_AMBULATORY_CARE_PROVIDER_SITE_OTHER): Payer: Medicare Other | Admitting: Family

## 2022-10-22 ENCOUNTER — Encounter: Payer: Self-pay | Admitting: Family

## 2022-10-22 VITALS — BP 105/58 | HR 66 | Temp 97.1°F | Ht 62.0 in | Wt 173.6 lb

## 2022-10-22 DIAGNOSIS — E1165 Type 2 diabetes mellitus with hyperglycemia: Secondary | ICD-10-CM

## 2022-10-22 DIAGNOSIS — N1832 Chronic kidney disease, stage 3b: Secondary | ICD-10-CM

## 2022-10-22 DIAGNOSIS — F411 Generalized anxiety disorder: Secondary | ICD-10-CM | POA: Diagnosis not present

## 2022-10-22 DIAGNOSIS — I5022 Chronic systolic (congestive) heart failure: Secondary | ICD-10-CM | POA: Diagnosis not present

## 2022-10-22 DIAGNOSIS — I700 Atherosclerosis of aorta: Secondary | ICD-10-CM

## 2022-10-22 DIAGNOSIS — Z7984 Long term (current) use of oral hypoglycemic drugs: Secondary | ICD-10-CM

## 2022-10-22 DIAGNOSIS — K219 Gastro-esophageal reflux disease without esophagitis: Secondary | ICD-10-CM

## 2022-10-22 DIAGNOSIS — L089 Local infection of the skin and subcutaneous tissue, unspecified: Secondary | ICD-10-CM

## 2022-10-22 DIAGNOSIS — E039 Hypothyroidism, unspecified: Secondary | ICD-10-CM

## 2022-10-22 MED ORDER — APIXABAN 5 MG PO TABS: 5.0000 mg | ORAL_TABLET | Freq: Two times a day (BID) | ORAL | 2 refills | Status: DC

## 2022-10-22 MED ORDER — CARVEDILOL 12.5 MG PO TABS: 12.5000 mg | ORAL_TABLET | Freq: Two times a day (BID) | ORAL | 1 refills | Status: DC

## 2022-10-22 MED ORDER — SPIRONOLACTONE 25 MG PO TABS: 25.0000 mg | ORAL_TABLET | Freq: Every day | ORAL | 1 refills | Status: DC

## 2022-10-22 MED ORDER — MUPIROCIN 2 % EX OINT
1.0000 | TOPICAL_OINTMENT | Freq: Two times a day (BID) | CUTANEOUS | 0 refills | Status: DC
Start: 2022-10-22 — End: 2022-11-18

## 2022-10-22 MED ORDER — FUROSEMIDE 80 MG PO TABS: 80.0000 mg | ORAL_TABLET | Freq: Every day | ORAL | 1 refills | Status: DC | PRN

## 2022-10-22 MED ORDER — METFORMIN HCL ER 750 MG PO TB24: 750.0000 mg | ORAL_TABLET | Freq: Two times a day (BID) | ORAL | 1 refills | Status: DC

## 2022-10-22 NOTE — Progress Notes (Signed)
Subjective:    Patient ID: Melissa Paul, female    DOB: 09/06/1945, 77 y.o.   MRN: [id number]  Chief Complaint  Patient presents with   Medical Management of Chronic Issues    Blood in belly button she wants looked at she states it is dryed blood    Pt presents to the office today for chronic follow up. She is followed by Cardiologists for A Fib, CHF, and cardiomyopathy. And has a pacemaker. She is followed by CHF clinic and weighs daily. She takes Eliquis 5 mg BID for A FIB. Has ECHO scheduled this month.    She had a right heart cath on 10/27/20. She reports she is doing well.    Complaining of neuropathy in bilateral legs.    Has CKD and avoids NSAID's.    She has an appointment to establish with Urologists on 11/13/22 for frequent UTI's.   She is complaining of a rash in her umbilical area Congestive Heart Failure Presents for follow-up visit. Associated symptoms include edema and fatigue. The symptoms have been stable.  Gastroesophageal Reflux She complains of belching and heartburn. This is a chronic problem. The current episode started more than 1 year ago. The problem occurs occasionally. Associated symptoms include fatigue. Risk factors include obesity. She has tried a PPI for the symptoms. The treatment provided moderate relief.  Diabetes She presents for her follow-up diabetic visit. She has type 2 diabetes mellitus. Hypoglycemia symptoms include nervousness/anxiousness. Associated symptoms include fatigue and foot paresthesias. Pertinent negatives for diabetes include no blurred vision. Symptoms are stable. Diabetic complications include peripheral neuropathy. Risk factors for coronary artery disease include dyslipidemia, diabetes mellitus, hypertension, sedentary lifestyle and post-menopausal. She is following a generally healthy diet. Her overall blood glucose range is 90-110 mg/dl.  Anxiety Presents for follow-up visit. Symptoms include excessive worry and  nervous/anxious behavior. Symptoms occur occasionally. The severity of symptoms is mild.    Constipation This is a chronic problem. The current episode started more than 1 year ago. The problem is unchanged. Her stool frequency is 2 to 3 times per week. She has tried laxatives for the symptoms. The treatment provided moderate relief.  Thyroid Problem Presents for follow-up visit. Symptoms include anxiety, constipation, dry skin and fatigue. The symptoms have been stable.      Review of Systems  Constitutional:  Positive for fatigue.  Eyes:  Negative for blurred vision.  Gastrointestinal:  Positive for constipation and heartburn.  Psychiatric/Behavioral:  The patient is nervous/anxious.   All other systems reviewed and are negative.      Objective:   Physical Exam Vitals reviewed.  Constitutional:      General: She is not in acute distress.    Appearance: She is well-developed.  HENT:     Head: Normocephalic and atraumatic.     Right Ear: Tympanic membrane normal.     Left Ear: Tympanic membrane normal.  Eyes:     Pupils: Pupils are equal, round, and reactive to light.  Neck:     Thyroid: No thyromegaly.  Cardiovascular:     Rate and Rhythm: Normal rate and regular rhythm.     Heart sounds: Normal heart sounds. No murmur heard. Pulmonary:     Effort: Pulmonary effort is normal. No respiratory distress.     Breath sounds: Normal breath sounds. No wheezing.  Abdominal:     General: Bowel sounds are normal. There is no distension.     Palpations: Abdomen is soft.     Tenderness: There  is no abdominal tenderness.  Musculoskeletal:        General: No tenderness. Normal range of motion.     Cervical back: Normal range of motion and neck supple.     Right lower leg: No edema.     Left lower leg: No edema (none on exam).  Skin:    General: Skin is warm and dry.     Findings: Erythema (in umbilical area with a scab) present.  Neurological:     Mental Status: She is alert  and oriented to person, place, and time.     Cranial Nerves: No cranial nerve deficit.     Deep Tendon Reflexes: Reflexes are normal and symmetric.  Psychiatric:        Behavior: Behavior normal.        Thought Content: Thought content normal.        Judgment: Judgment normal.      BP (!) 105/58   Pulse 66   Temp (!) 97.1 F (36.2 C) (Temporal)   Ht 5\' 2"  (1.575 m)   Wt 173 lb 9.6 oz (78.7 kg)   SpO2 95%   BMI 31.75 kg/m       Assessment & Plan:  Melissa Paul comes in today with chief complaint of Medical Management of Chronic Issues (Blood in belly button she wants looked at she states it is dryed blood )   Diagnosis and orders addressed:  1. Atherosclerosis of aorta (HCC) - CBC with Differential/Platelet - CMP14+EGFR  2. Chronic systolic heart failure (HCC) - CBC with Differential/Platelet - CMP14+EGFR  3. GAD (generalized anxiety disorder) - CBC with Differential/Platelet - CMP14+EGFR  4. Gastroesophageal reflux disease, unspecified whether esophagitis present - CBC with Differential/Platelet - CMP14+EGFR  5. Hypothyroidism, unspecified type - CBC with Differential/Platelet - CMP14+EGFR  6. Stage 3b chronic kidney disease (HCC) - CBC with Differential/Platelet - CMP14+EGFR  7. Type 2 diabetes mellitus with hyperglycemia, without long-term current use of insulin (HCC) - Bayer DCA Hb A1c Waived - CBC with Differential/Platelet - CMP14+EGFR  8. Skin infection Start bactroban BID  Keep clean and dry  - mupirocin ointment (BACTROBAN) 2 %; Apply 1 Application topically 2 (two) times daily.  Dispense: 22 g; Refill: 0 - CBC with Differential/Platelet - CMP14+EGFR   Labs pending Health Maintenance reviewed Diet and exercise encouraged  Follow up plan: 3 months   Melissa Rodney, FNP

## 2022-10-22 NOTE — Progress Notes (Signed)
EPIC Encounter for ICM Monitoring  Patient Name: Melissa Paul is a 77 y.o. female Date: 10/22/2022 Primary Care Physican: Junie Spencer, FNP Primary Cardiologist: [facility] Electrophysiologist: [facility] Bi-V Pacing: 86%        01/01/2022 Weight: 178 lbs 03/15/2022 Weight: 170 lbs 05/24/2022 Weight:  162 lbs 06/25/2022  Weight:  165 lbs 07/16/2022 Weight: 170 lbs      AT/AF Burden: <1% (taking Eliquis)        Transmission results reviewed.     CorVue Thoracic impedance suggesting normal fluid accumulation with the exception of possible fluid accumulation from 9/1-9/8 and 9/25 but returned close to baseline 9/27.   Prescribed:  Furosemide 80 mg Take 80 mg by mouth daily as needed.  Pt takes extra if needed.   06/25/22 She is taking about twice a week due to her body can't handle Furosemide daily.   Potassium 20 mEq take 1 tablet by mouth as directed.  Only take 1 tablet with extra doses of Lasix. Spirolactone 25 mg take 1 tablet by mouth daily   Labs: 08/13/2022 Creatinine 1.52, BUN 30, Potassium 3.8, Sodium 138, GFR 35 07/21/2022 Creatinine 1.07, BUN 13, Potassium 3.9, Sodium 138, GFR 53 07/08/2022 Creatinine 1.09, BUN 16, Potassium 3.6, Sodium 135, GFR 52 05/04/2022 Creatinine 1.40, BUN 23, Potassium 4.6, Sodium 134, GFR 39  04/23/2022 Creatinine 1.18, BUN 22, Potassium 3.4, Sodium 136, GFR 47  A complete set of results can be found in Results Review.   Recommendations:  No changes.   Follow-up plan: ICM clinic phone appointment on 11/18/2022.   91 day device clinic remote transmission 01/13/2023.    EP/Cardiology Office Visits:   11/29/2022 with Dr Nelly Laurence.  11/18/2022 with Dr Diona Browner.     Copy of ICM check sent to Dr. Nelly Laurence.      3 month ICM trend: 10/18/2022.    12-14 Month ICM trend:     Karie Soda, RN 10/22/2022 9:48 AM

## 2022-10-22 NOTE — Patient Instructions (Signed)
Health Maintenance After Age 79 After age 79, you are at a higher risk for certain long-term diseases and infections as well as injuries from falls. Falls are a major cause of broken bones and head injuries in people who are older than age 79. Getting regular preventive care can help to keep you healthy and well. Preventive care includes getting regular testing and making lifestyle changes as recommended by your health care provider. Talk with your health care provider about: Which screenings and tests you should have. A screening is a test that checks for a disease when you have no symptoms. A diet and exercise plan that is right for you. What should I know about screenings and tests to prevent falls? Screening and testing are the best ways to find a health problem early. Early diagnosis and treatment give you the best chance of managing medical conditions that are common after age 79. Certain conditions and lifestyle choices may make you more likely to have a fall. Your health care provider may recommend: Regular vision checks. Poor vision and conditions such as cataracts can make you more likely to have a fall. If you wear glasses, make sure to get your prescription updated if your vision changes. Medicine review. Work with your health care provider to regularly review all of the medicines you are taking, including over-the-counter medicines. Ask your health care provider about any side effects that may make you more likely to have a fall. Tell your health care provider if any medicines that you take make you feel dizzy or sleepy. Strength and balance checks. Your health care provider may recommend certain tests to check your strength and balance while standing, walking, or changing positions. Foot health exam. Foot pain and numbness, as well as not wearing proper footwear, can make you more likely to have a fall. Screenings, including: Osteoporosis screening. Osteoporosis is a condition that causes  the bones to get weaker and break more easily. Blood pressure screening. Blood pressure changes and medicines to control blood pressure can make you feel dizzy. Depression screening. You may be more likely to have a fall if you have a fear of falling, feel depressed, or feel unable to do activities that you used to do. Alcohol use screening. Using too much alcohol can affect your balance and may make you more likely to have a fall. Follow these instructions at home: Lifestyle Do not drink alcohol if: Your health care provider tells you not to drink. If you drink alcohol: Limit how much you have to: 0-1 drink a day for women. 0-2 drinks a day for men. Know how much alcohol is in your drink. In the [place] one drink equals one 12 oz bottle of beer (355 mL), one 5 oz glass of wine (148 mL), or one 1 oz glass of hard liquor (44 mL). Do not use any products that contain nicotine or tobacco. These products include cigarettes, chewing tobacco, and vaping devices, such as e-cigarettes. If you need help quitting, ask your health care provider. Activity  Follow a regular exercise program to stay fit. This will help you maintain your balance. Ask your health care provider what types of exercise are appropriate for you. If you need a cane or walker, use it as recommended by your health care provider. Wear supportive shoes that have nonskid soles. Safety  Remove any tripping hazards, such as rugs, cords, and clutter. Install safety equipment such as grab bars in bathrooms and safety rails on stairs. Keep rooms and walkways   well-lit. General instructions Talk with your health care provider about your risks for falling. Tell your health care provider if: You fall. Be sure to tell your health care provider about all falls, even ones that seem minor. You feel dizzy, tiredness (fatigue), or off-balance. Take over-the-counter and prescription medicines only as told by your health care provider. These include  supplements. Eat a healthy diet and maintain a healthy weight. A healthy diet includes low-fat dairy products, low-fat (lean) meats, and fiber from whole grains, beans, and lots of fruits and vegetables. Stay current with your vaccines. Schedule regular health, dental, and eye exams. Summary Having a healthy lifestyle and getting preventive care can help to protect your health and wellness after age 79. Screening and testing are the best way to find a health problem early and help you avoid having a fall. Early diagnosis and treatment give you the best chance for managing medical conditions that are more common for people who are older than age 79. Falls are a major cause of broken bones and head injuries in people who are older than age 79. Take precautions to prevent a fall at home. Work with your health care provider to learn what changes you can make to improve your health and wellness and to prevent falls. This information is not intended to replace advice given to you by your health care provider. Make sure you discuss any questions you have with your health care provider. Document Revised: 05/29/2020 Document Reviewed: 05/29/2020 [employer] Patient Education  2024 [employer]  

## 2022-10-23 LAB — CMP14+EGFR
ALT: 5 [IU]/L (ref 0–32)
AST: 11 [IU]/L (ref 0–40)
Albumin: 4.7 g/dL (ref 3.8–4.8)
Alkaline Phosphatase: 73 [IU]/L (ref 44–121)
BUN/Creatinine Ratio: 19 (ref 12–28)
BUN: 25 mg/dL (ref 8–27)
Bilirubin Total: 0.4 mg/dL (ref 0.0–1.2)
CO2: 24 mmol/L (ref 20–29)
Calcium: 9.1 mg/dL (ref 8.7–10.3)
Chloride: 98 mmol/L (ref 96–106)
Creatinine, Ser: 1.31 mg/dL — ABNORMAL HIGH (ref 0.57–1.00)
Globulin, Total: 2.2 g/dL (ref 1.5–4.5)
Glucose: 98 mg/dL (ref 70–99)
Potassium: 4.6 mmol/L (ref 3.5–5.2)
Sodium: 139 mmol/L (ref 134–144)
Total Protein: 6.9 g/dL (ref 6.0–8.5)
eGFR: 41 mL/min/{1.73_m2} — ABNORMAL LOW (ref >59)

## 2022-10-23 LAB — CBC WITH DIFFERENTIAL/PLATELET
Basophils Absolute: 0 10*3/uL (ref 0.0–0.2)
Basos: 1 %
EOS (ABSOLUTE): 0 10*3/uL (ref 0.0–0.4)
Eos: 1 %
Hematocrit: 30 % — ABNORMAL LOW (ref 34.0–46.6)
Hemoglobin: 9.3 g/dL — ABNORMAL LOW (ref 11.1–15.9)
Immature Grans (Abs): 0.1 10*3/uL (ref 0.0–0.1)
Immature Granulocytes: 1 %
Lymphocytes Absolute: 2.7 10*3/uL (ref 0.7–3.1)
Lymphs: 41 %
MCH: 23.9 pg — ABNORMAL LOW (ref 26.6–33.0)
MCHC: 31 g/dL — ABNORMAL LOW (ref 31.5–35.7)
MCV: 77 fL — ABNORMAL LOW (ref 79–97)
Monocytes Absolute: 0.7 10*3/uL (ref 0.1–0.9)
Monocytes: 11 %
Neutrophils Absolute: 3.1 10*3/uL (ref 1.4–7.0)
Neutrophils: 45 %
Platelets: 216 10*3/uL (ref 150–450)
RBC: 3.89 x10E6/uL (ref 3.77–5.28)
RDW: 16.8 % — ABNORMAL HIGH (ref 11.7–15.4)
WBC: 6.6 10*3/uL (ref 3.4–10.8)

## 2022-10-23 LAB — BAYER DCA HB A1C WAIVED: HB A1C (BAYER DCA - WAIVED): 6.3 % — ABNORMAL HIGH (ref 4.8–5.6)

## 2022-10-26 ENCOUNTER — Other Ambulatory Visit: Payer: Self-pay | Admitting: Family

## 2022-10-26 LAB — SPECIMEN STATUS REPORT

## 2022-10-26 LAB — IRON AND TIBC
Iron Saturation: 5 % — CL (ref 15–55)
Iron: 20 ug/dL — ABNORMAL LOW (ref 27–139)
Total Iron Binding Capacity: 404 ug/dL (ref 250–450)
UIBC: 384 ug/dL — ABNORMAL HIGH (ref 118–369)

## 2022-10-28 ENCOUNTER — Other Ambulatory Visit: Payer: Self-pay | Admitting: Family

## 2022-10-28 DIAGNOSIS — D509 Iron deficiency anemia, unspecified: Secondary | ICD-10-CM

## 2022-10-30 ENCOUNTER — Ambulatory Visit: Payer: Medicare Other | Attending: Cardiology

## 2022-10-30 DIAGNOSIS — I5020 Unspecified systolic (congestive) heart failure: Secondary | ICD-10-CM | POA: Diagnosis present

## 2022-10-31 LAB — ECHOCARDIOGRAM COMPLETE
AR max vel: 1.57 cm2
AV Area VTI: 1.52 cm2
AV Area mean vel: 1.37 cm2
AV Mean grad: 5 mm[Hg]
AV Peak grad: 8.8 mm[Hg]
Ao pk vel: 1.48 m/s
Area-P 1/2: 4.17 cm2
Calc EF: 27.4 %
Est EF: 30
MV M vel: 4.87 m/s
MV Peak grad: 94.9 mm[Hg]
MV VTI: 1.66 cm2
S' Lateral: 6.1 cm
Single Plane A2C EF: 25.3 %
Single Plane A4C EF: 28.6 %

## 2022-11-05 ENCOUNTER — Inpatient Hospital Stay: Payer: Medicare Other

## 2022-11-05 ENCOUNTER — Inpatient Hospital Stay: Payer: Medicare Other | Attending: Hematology | Admitting: Hematology

## 2022-11-05 VITALS — BP 119/49 | HR 70 | Temp 97.7°F | Resp 18 | Ht 62.0 in | Wt 176.2 lb

## 2022-11-05 DIAGNOSIS — Z79899 Other long term (current) drug therapy: Secondary | ICD-10-CM | POA: Insufficient documentation

## 2022-11-05 DIAGNOSIS — Z87440 Personal history of urinary (tract) infections: Secondary | ICD-10-CM | POA: Insufficient documentation

## 2022-11-05 DIAGNOSIS — I5022 Chronic systolic (congestive) heart failure: Secondary | ICD-10-CM | POA: Insufficient documentation

## 2022-11-05 DIAGNOSIS — R112 Nausea with vomiting, unspecified: Secondary | ICD-10-CM | POA: Insufficient documentation

## 2022-11-05 DIAGNOSIS — Z87891 Personal history of nicotine dependence: Secondary | ICD-10-CM | POA: Insufficient documentation

## 2022-11-05 DIAGNOSIS — R5383 Other fatigue: Secondary | ICD-10-CM | POA: Diagnosis not present

## 2022-11-05 DIAGNOSIS — K649 Unspecified hemorrhoids: Secondary | ICD-10-CM | POA: Insufficient documentation

## 2022-11-05 DIAGNOSIS — Z7901 Long term (current) use of anticoagulants: Secondary | ICD-10-CM | POA: Diagnosis not present

## 2022-11-05 DIAGNOSIS — F419 Anxiety disorder, unspecified: Secondary | ICD-10-CM | POA: Diagnosis not present

## 2022-11-05 DIAGNOSIS — Z8249 Family history of ischemic heart disease and other diseases of the circulatory system: Secondary | ICD-10-CM | POA: Diagnosis not present

## 2022-11-05 DIAGNOSIS — Z880 Allergy status to penicillin: Secondary | ICD-10-CM | POA: Diagnosis not present

## 2022-11-05 DIAGNOSIS — E538 Deficiency of other specified B group vitamins: Secondary | ICD-10-CM | POA: Diagnosis not present

## 2022-11-05 DIAGNOSIS — Z90721 Acquired absence of ovaries, unilateral: Secondary | ICD-10-CM | POA: Insufficient documentation

## 2022-11-05 DIAGNOSIS — Z800 Family history of malignant neoplasm of digestive organs: Secondary | ICD-10-CM | POA: Diagnosis not present

## 2022-11-05 DIAGNOSIS — D509 Iron deficiency anemia, unspecified: Secondary | ICD-10-CM

## 2022-11-05 DIAGNOSIS — Z823 Family history of stroke: Secondary | ICD-10-CM | POA: Insufficient documentation

## 2022-11-05 DIAGNOSIS — K625 Hemorrhage of anus and rectum: Secondary | ICD-10-CM | POA: Diagnosis not present

## 2022-11-05 DIAGNOSIS — Z9049 Acquired absence of other specified parts of digestive tract: Secondary | ICD-10-CM | POA: Insufficient documentation

## 2022-11-05 DIAGNOSIS — Z881 Allergy status to other antibiotic agents status: Secondary | ICD-10-CM | POA: Insufficient documentation

## 2022-11-05 DIAGNOSIS — E039 Hypothyroidism, unspecified: Secondary | ICD-10-CM | POA: Diagnosis not present

## 2022-11-05 DIAGNOSIS — Z90710 Acquired absence of both cervix and uterus: Secondary | ICD-10-CM | POA: Diagnosis not present

## 2022-11-05 DIAGNOSIS — I480 Paroxysmal atrial fibrillation: Secondary | ICD-10-CM | POA: Insufficient documentation

## 2022-11-05 DIAGNOSIS — Z888 Allergy status to other drugs, medicaments and biological substances status: Secondary | ICD-10-CM | POA: Diagnosis not present

## 2022-11-05 LAB — FOLATE: Folate: 18.6 ng/mL (ref >5.9)

## 2022-11-05 LAB — VITAMIN B12: Vitamin B-12: 133 pg/mL — ABNORMAL LOW (ref 180–914)

## 2022-11-05 LAB — FERRITIN: Ferritin: 3 ng/mL — ABNORMAL LOW (ref 11–307)

## 2022-11-05 NOTE — Progress Notes (Signed)
[facility] [address], [place] [address]   Clinic Day:  11/05/2022  Referring physician: Junie Spencer, FNP  Patient Care Team: Junie Spencer, FNP as PCP - General (Family Medicine) [name], MD as PCP - Cardiology (Cardiology) Mealor, Roberts Gaudy, MD as PCP - Electrophysiology (Cardiology) Danella Maiers, [facility] as Pharmacist (Family Medicine) Michaelle Copas, MD as Referring Physician (Optometry)   ASSESSMENT & PLAN:   Assessment:  1.  Severe iron deficiency anemia: - Patient seen at the request of Kristopher Glee, FNP. - CBC (10/22/2022): Hb-9.3, MCV-77.  Percent saturation 5.  TIBC 404. - She has occasional hemorrhoid bleeding.  Denies melena.  Reports 22-month tiredness.  Last transfusion more than 20 years ago.  Positive for ice pica.  Cannot tolerate oral iron therapy secondary to vomiting.  Denies any prior intravenous iron.  Colonoscopy was more than 20 years ago.  2. Social/Family History: -Lives at home with daughter. Independent of ADL's and dependent of IADL's. Quit tobacco use 25 years ago.  -No family history of anemia. Father had stomach cancer.   Plan:  1.  Severe iron deficiency anemia: - We checked ferritin today which was low at 3.  Folic acid was normal.  B12 was also low at 133. - We talked about parenteral iron therapy with Monoferric 1 g IV x 1.  Since she has a lot of allergies, we will premedicate with steroid, H1 and H2 blockers. - We will also start her on B12 injection as a loading dose and let her take B12 1 mg tablet daily.  We will plan to repeat B12 level in at next visit. - She will come back in 8 weeks for follow-up with repeat CBC, ferritin, iron panel and B12.   Orders Placed This Encounter  Procedures   Ferritin    Standing Status:   Future    Number of Occurrences:   1    Standing Expiration Date:   11/05/2023   Vitamin B12    Standing Status:   Future    Number of Occurrences:   1    Standing  Expiration Date:   11/05/2023   Folate    Standing Status:   Future    Number of Occurrences:   1    Standing Expiration Date:   11/05/2023   Methylmalonic acid, serum    Standing Status:   Future    Number of Occurrences:   1    Standing Expiration Date:   11/05/2023   Copper, serum    Standing Status:   Future    Number of Occurrences:   1    Standing Expiration Date:   11/05/2023   CBC with Differential    Standing Status:   Future    Standing Expiration Date:   11/05/2023   Ferritin    Standing Status:   Future    Standing Expiration Date:   11/05/2023   Iron and TIBC ([facility] ONLY)    Standing Status:   Future    Standing Expiration Date:   11/05/2023      Mikeal Hawthorne R Teague,acting as a scribe for Doreatha Massed, MD.,have documented all relevant documentation on the behalf of Doreatha Massed, MD,as directed by  Doreatha Massed, MD while in the presence of Doreatha Massed, MD.   I, Doreatha Massed MD, have reviewed the above documentation for accuracy and completeness, and I agree with the above.   Doreatha Massed, MD  10/15/20244:51 PM  CHIEF COMPLAINT/PURPOSE OF CONSULT:   Diagnosis: Iron deficiency anemia and B12 deficiency  Current Therapy: Monoferric and vitamin B12  HISTORY OF PRESENT ILLNESS:   Melissa Paul is a 77 y.o. female presenting to clinic today for evaluation of IDA at the request of Junie Spencer, FNP.  Today, she states that she is doing well overall. Her appetite level is at 25%. Her energy level is at 25%. She is accompanied by her son.   She was found to have abnormal CBC from 10/22/22 with low HGB at 9.3, low HCT at 30.0, low MCV at 77, low MCH at 23.9, low MCHC at 31.0, and elevated RDW at 16.8. Her Iron and TIBC panel from 10/22/22 was abnormal with elevated UIBC at 384, low iron at 20, and severely low iron saturation at 5.   She reports tiredness over the last 6 months. She has ice pica and states  eating ice helps with the nausea she has. She occasionally has BRBPR and hemorrhoids. She denies melena. She has had multiple blood transfusions, her last one in 1986. She had a hysterectomy. She cannot tolerate iron pills, as she vomits when taking it. She has not had IV iron before and has multiple medicinal allergies. Her last colonoscopy was in the 1980's.   PAST MEDICAL HISTORY:   Past Medical History: Past Medical History:  Diagnosis Date   Anxiety    Chronic systolic heart failure (HCC)    Constipation    Cyst of right kidney 11/12/2017   Glucose intolerance (pre-diabetes)    Hyperlipemia    Hypothyroidism    LBBB (left bundle branch block)    Mitral regurgitation    Mild to Moderate   Nonischemic cardiomyopathy (HCC)    LVEF 35-40% by echo 8/13   Paroxysmal atrial fibrillation (HCC)    Brief episodes by device interrogation   UTI (urinary tract infection)    Recurrent   Ventricular tachycardia (HCC)    ICD therapy for VT    Surgical History: Past Surgical History:  Procedure Laterality Date   APPENDECTOMY     BIV ICD GENERATOR CHANGEOUT N/A 07/11/2016   Procedure: BiV ICD Generator Changeout;  Surgeon: Hillis Range, MD;  Location: [facility] INVASIVE CV LAB;  Service: Cardiovascular;  Laterality: N/A;   CARDIAC DEFIBRILLATOR PLACEMENT  09/2009   St. Jude BiV ICD by Dr. Shanna Cisco class III   CHOLECYSTECTOMY     has one ovary left     KNEE ARTHROSCOPY     Left   LEFT AND RIGHT HEART CATHETERIZATION WITH CORONARY ANGIOGRAM N/A 04/12/2014   Procedure: LEFT AND RIGHT HEART CATHETERIZATION WITH CORONARY ANGIOGRAM;  Surgeon: Marykay Lex, MD;  Location: [facility] CATH LAB;  Service: Cardiovascular;  Laterality: N/A;   Left breast biopsy x 2     no maliganancy   PACEMAKER IMPLANT     RIGHT HEART CATH N/A 10/27/2020   Procedure: RIGHT HEART CATH;  Surgeon: Laurey Morale, MD;  Location: [facility] INVASIVE CV LAB;  Service: Cardiovascular;  Laterality: N/A;   RIGHT/LEFT HEART CATH AND  CORONARY ANGIOGRAPHY N/A 03/26/2019   Procedure: RIGHT/LEFT HEART CATH AND CORONARY ANGIOGRAPHY;  Surgeon: Laurey Morale, MD;  Location: [facility] INVASIVE CV LAB;  Service: Cardiovascular;  Laterality: N/A;   TOTAL ABDOMINAL HYSTERECTOMY     (R) ovary removed    Social History: Social History   Socioeconomic History   Marital status: Widowed    Spouse name: Not on file   Number of children: 2  Years of education: Not on file   Highest education level: Not on file  Occupational History   Not on file  Tobacco Use   Smoking status: Former    Current packs/day: 0.00    Average packs/day: 0.3 packs/day for 46.5 years (13.9 ttl pk-yrs)    Types: Cigarettes    Start date: 08/03/1949    Quit date: 01/22/1996    Years since quitting: 26.8    Passive exposure: Never   Smokeless tobacco: Never  Vaping Use   Vaping status: Never Used  Substance and Sexual Activity   Alcohol use: No    Alcohol/week: 0.0 standard drinks of alcohol   Drug use: No   Sexual activity: Not Currently    Birth control/protection: Surgical    Comment: hyst  Other Topics Concern   Not on file  Social History Narrative   Her daughter, [name] lives with her - takes her to all visits and cooks for her.   Social Determinants of Health   Financial Resource Strain: Low Risk  (09/11/2022)   Overall Financial Resource Strain (CARDIA)    Difficulty of Paying Living Expenses: Not hard at all  Food Insecurity: No Food Insecurity (09/11/2022)   Hunger Vital Sign    Worried About Running Out of Food in the Last Year: Never true    Ran Out of Food in the Last Year: Never true  Transportation Needs: No Transportation Needs (09/11/2022)   PRAPARE - Administrator, Civil Service (Medical): No    Lack of Transportation (Non-Medical): No  Physical Activity: Inactive (09/11/2022)   Exercise Vital Sign    Days of Exercise per Week: 0 days    Minutes of Exercise per Session: 0 min  Stress: No Stress Concern Present  (09/11/2022)   [employer] of Occupational Health - Occupational Stress Questionnaire    Feeling of Stress : Not at all  Social Connections: Moderately Isolated (09/11/2022)   Social Connection and Isolation Panel [NHANES]    Frequency of Communication with Friends and Family: More than three times a week    Frequency of Social Gatherings with Friends and Family: More than three times a week    Attends Religious Services: 1 to 4 times per year    Active Member of [employer] or Organizations: No    Attends Banker Meetings: Never    Marital Status: Widowed  Intimate Partner Violence: Not At Risk (09/11/2022)   Humiliation, Afraid, Rape, and Kick questionnaire    Fear of Current or Ex-Partner: No    Emotionally Abused: No    Physically Abused: No    Sexually Abused: No    Family History: Family History  Problem Relation Age of Onset   CVA Mother        multiple   Heart Problems Mother        "weak heart"   Heart disease Maternal Grandmother    Heart disease Maternal Grandfather    Cancer Father        Stomach and eshpogus   Diabetes Neg Hx    Hypertension Neg Hx    Coronary artery disease Neg Hx     Current Medications:  Current Outpatient Medications:    acetaminophen (TYLENOL) 650 MG CR tablet, Take 650 mg by mouth every 8 (eight) hours as needed for pain., Disp: , Rfl:    albuterol (PROVENTIL) (2.5 MG/3ML) 0.083% nebulizer solution, Take 3 mLs (2.5 mg total) by nebulization every 6 (six) hours as needed for wheezing  or shortness of breath., Disp: 150 mL, Rfl: 1   apixaban (ELIQUIS) 5 MG TABS tablet, Take 1 tablet (5 mg total) by mouth 2 (two) times daily., Disp: 180 tablet, Rfl: 2   blood glucose meter kit and supplies, 1 each by Other route 2 (two) times daily as needed for other. Dispense based on patient and insurance preference. . (FOR ICD-10 E10.9, E11.9).Dispense based on patient and insurance preference., Disp: 1 each, Rfl: 0   carvedilol (COREG) 12.5 MG  tablet, Take 1 tablet (12.5 mg total) by mouth 2 (two) times daily., Disp: 180 tablet, Rfl: 1   dapagliflozin propanediol (FARXIGA) 10 MG TABS tablet, Take 1 tablet (10 mg total) by mouth daily., Disp: 90 tablet, Rfl: 6   estradiol (ESTRACE) 0.1 MG/GM vaginal cream, 0.5gm vaginally at bedtime 2-3x weekly., Disp: 42.5 g, Rfl: 12   fosfomycin (MONUROL) 3 g PACK, Take 3gm po every 10 days., Disp: 3 each, Rfl: 11   furosemide (LASIX) 80 MG tablet, Take 1 tablet (80 mg total) by mouth daily as needed for fluid., Disp: 90 tablet, Rfl: 1   glucose blood (GLUCOSE METER TEST) test strip, Use BID, Disp: 100 each, Rfl: 12   hydrOXYzine (VISTARIL) 25 MG capsule, Take 1 capsule (25 mg total) by mouth every 8 (eight) hours as needed., Disp: 60 capsule, Rfl: 2   levothyroxine (SYNTHROID) 88 MCG tablet, TAKE 1 TABLET BY MOUTH ONCE DAILY BEFORE BREAKFAST, Disp: 90 tablet, Rfl: 0   metFORMIN (GLUCOPHAGE-XR) 750 MG 24 hr tablet, Take 1 tablet (750 mg total) by mouth 2 (two) times daily., Disp: 60 tablet, Rfl: 1   mupirocin ointment (BACTROBAN) 2 %, Apply 1 Application topically 2 (two) times daily., Disp: 22 g, Rfl: 0   pantoprazole (PROTONIX) 40 MG tablet, TAKE 1 TABLET BY MOUTH ONCE DAILY BEFORE BREAKFAST (Patient taking differently: Take 40 mg by mouth daily as needed (reflux).), Disp: 90 tablet, Rfl: 1   polyethylene glycol (MIRALAX) 17 g packet, Take 17 g by mouth daily. (Patient taking differently: Take 17 g by mouth daily as needed for mild constipation or moderate constipation.), Disp: 14 each, Rfl: 0   potassium chloride SA (KLOR-CON M) 20 MEQ tablet, Take 1 tablet (20 mEq total) by mouth as directed. Only take 1 tablet with extra doses of Lasix, Disp: 30 tablet, Rfl: 3   spironolactone (ALDACTONE) 25 MG tablet, Take 1 tablet (25 mg total) by mouth daily., Disp: 90 tablet, Rfl: 1   Allergies: Allergies  Allergen Reactions   Buspar [Buspirone] Other (See Comments)    Patient states that she became hyper and  had lots of itching.   Lexapro [Escitalopram Oxalate] Other (See Comments)    Patient states that she became hyper and lots of itching   Trulicity [Dulaglutide] Hives and Itching    Tongue was numb.   Bactrim Rash   Cephalexin Swelling and Rash    Says she is able to take cefuroxime Throat swelling   Chlorhexidine Gluconate Hives and Itching    Hives, itching after using wipes with last procedure   Clindamycin Rash        Contrast Media [Iodinated Contrast Media] Rash   Crestor [Rosuvastatin] Hives and Rash   Doxycycline Rash   Latex Rash        Levofloxacin Rash        Nitrofurantoin Monohyd Macro Rash        Penicillins Swelling and Rash    Has patient had a PCN reaction causing immediate rash, facial/tongue/throat  swelling, SOB or lightheadedness with hypotension: Yes Has patient had a PCN reaction causing severe rash involving mucus membranes or skin necrosis: Yes Has patient had a PCN reaction that required hospitalization: No Has patient had a PCN reaction occurring within the last 10 years: No Rash/swelled tongue If all of the above answers are "NO", then may proceed with Cephalosporin use.     REVIEW OF SYSTEMS:   Review of Systems  Constitutional:  Negative for chills, fatigue and fever.  HENT:   Positive for trouble swallowing. Negative for lump/mass, mouth sores, nosebleeds and sore throat.   Eyes:  Negative for eye problems.  Respiratory:  Positive for cough and shortness of breath.   Cardiovascular:  Negative for chest pain, leg swelling and palpitations.  Gastrointestinal:  Positive for abdominal pain (6/10 severity), constipation and nausea. Negative for diarrhea and vomiting.  Genitourinary:  Negative for bladder incontinence, difficulty urinating, dysuria, frequency, hematuria and nocturia.        +UTI  Musculoskeletal:  Positive for arthralgias (bilateral knees, 5/10 severity). Negative for back pain, flank pain, myalgias and neck pain.  Skin:   Negative for itching and rash.  Neurological:  Positive for dizziness. Negative for headaches and numbness.       +tingling hands and feet  Hematological:  Does not bruise/bleed easily.  Psychiatric/Behavioral:  Positive for sleep disturbance. Negative for depression and suicidal ideas. The patient is nervous/anxious.   All other systems reviewed and are negative.    VITALS:   Blood pressure (!) 119/49, pulse 70, temperature 97.7 F (36.5 C), temperature source Oral, resp. rate 18, height 5\' 2"  (1.575 m), weight 176 lb 3.2 oz (79.9 kg), SpO2 99%.  Wt Readings from Last 3 Encounters:  11/05/22 176 lb 3.2 oz (79.9 kg)  10/22/22 173 lb 9.6 oz (78.7 kg)  09/11/22 164 lb (74.4 kg)    Body mass index is 32.23 kg/m.   PHYSICAL EXAM:   Physical Exam Vitals and nursing note reviewed. Exam conducted with a chaperone present.  Constitutional:      Appearance: Normal appearance.  Cardiovascular:     Rate and Rhythm: Normal rate and regular rhythm.     Pulses: Normal pulses.     Heart sounds: Normal heart sounds.  Pulmonary:     Effort: Pulmonary effort is normal.     Breath sounds: Normal breath sounds.  Abdominal:     Palpations: Abdomen is soft. There is no hepatomegaly, splenomegaly or mass.     Tenderness: There is no abdominal tenderness.  Musculoskeletal:     Right lower leg: No edema.     Left lower leg: No edema.  Lymphadenopathy:     Cervical: No cervical adenopathy.     Right cervical: No superficial, deep or posterior cervical adenopathy.    Left cervical: No superficial, deep or posterior cervical adenopathy.     Upper Body:     Right upper body: No supraclavicular or axillary adenopathy.     Left upper body: No supraclavicular or axillary adenopathy.  Neurological:     General: No focal deficit present.     Mental Status: She is alert and oriented to person, place, and time.  Psychiatric:        Mood and Affect: Mood normal.        Behavior: Behavior normal.      LABS:      Latest Ref Rng & Units 10/22/2022    4:16 PM 08/13/2022    3:24 PM 07/21/2022  11:52 PM  CBC  WBC 3.4 - 10.8 x10E3/uL 6.6  5.9  5.0   Hemoglobin 11.1 - 15.9 g/dL 9.3  04.5  8.7   Hematocrit 34.0 - 46.6 % 30.0  31.0  28.6   Platelets 150 - 450 x10E3/uL 216  276  169       Latest Ref Rng & Units 10/22/2022    4:16 PM 08/13/2022    3:24 PM 07/21/2022   11:52 PM  CMP  Glucose 70 - 99 mg/dL 98  409  811   BUN 8 - 27 mg/dL 25  30  13    Creatinine 0.57 - 1.00 mg/dL 9.14  7.82  9.56   Sodium 134 - 144 mmol/L 139  138  138   Potassium 3.5 - 5.2 mmol/L 4.6  3.8  3.9   Chloride 96 - 106 mmol/L 98  95  106   CO2 20 - 29 mmol/L 24  25  23    Calcium 8.7 - 10.3 mg/dL 9.1  9.5  8.9   Total Protein 6.0 - 8.5 g/dL 6.9     Total Bilirubin 0.0 - 1.2 mg/dL 0.4     Alkaline Phos 44 - 121 IU/L 73     AST 0 - 40 IU/L 11     ALT 0 - 32 IU/L 5        No results found for: "CEA1", "CEA" / No results found for: "CEA1", "CEA" No results found for: "PSA1" No results found for: "OZH086" No results found for: "CAN125"  Lab Results  Component Value Date   TOTALPROTELP 6.8 04/10/2007   ALBUMINELP 59.5 04/10/2007   A1GS 4.7 04/10/2007   A2GS 8.6 04/10/2007   BETS 6.2 04/10/2007   BETA2SER 5.8 04/10/2007   GAMS 15.2 04/10/2007   MSPIKE NOT DETECTED 04/10/2007   SPEI  04/10/2007    (NOTE) Normal pattern. Reviewed by Dallas Breeding, MD, PhD (Electronic Signature on File)   Lab Results  Component Value Date   TIBC 404 10/22/2022   TIBC 328 01/24/2022   FERRITIN 3 (L) 11/05/2022   FERRITIN 44 01/24/2022   IRONPCTSAT 5 (LL) 10/22/2022   IRONPCTSAT 7 (LL) 01/24/2022   No results found for: "LDH"   STUDIES:   ECHOCARDIOGRAM COMPLETE  Result Date: 10/31/2022    ECHOCARDIOGRAM REPORT   Patient Name:   ISMA PHILP Date of Exam: 10/30/2022 Medical Rec #:  [id number]        Height:       62.0 in Accession #:    [id number]       Weight:       173.6 lb Date of Birth:   [birth year]        BSA:          1.800 m Patient Age:    77 years         BP:           106/58 mmHg Patient Gender: F                HR:           59 bpm. Exam Location:  [place] Procedure: 2D Echo Indications:    I50.20* Unspecified systolic (congestive) heart failure  History:        Patient has prior history of Echocardiogram examinations, most                 recent 11/06/2021. CHF and Cardiomyopathy, CAD, Defibrillator,  Chronic kidney disease, Mitral Valve Disease, Arrythmias:Atrial                 Fibrillation, LBBB and Tachycardia, Signs/Symptoms:Fatigue and                 Dyspnea; Risk Factors:Former Smoker.  Sonographer:    Dominica Severin RCS, RVS Referring Phys: [id number] SAMUEL G MCDOWELL  Sonographer Comments: Global longitudinal strain was attempted. IMPRESSIONS  1. Left ventricular ejection fraction, by estimation, is 30%. The left ventricle has moderately decreased function. The left ventricle demonstrates global hypokinesis. The left ventricular internal cavity size was moderately dilated. Left ventricular diastolic parameters are consistent with Grade II diastolic dysfunction (pseudonormalization). Elevated left atrial pressure.  2. Right ventricular systolic function is normal. The right ventricular size is normal. Tricuspid regurgitation signal is inadequate for assessing PA pressure.  3. Left atrial size was mildly dilated.  4. The mitral valve is abnormal. Mild mitral valve regurgitation. No evidence of mitral stenosis.  5. The aortic valve is tricuspid. Aortic valve regurgitation is not visualized. No aortic stenosis is present.  6. The inferior vena cava is normal in size with greater than 50% respiratory variability, suggesting right atrial pressure of 3 mmHg. Comparison(s): EF 25-30%. Mildly dilated left ventricle. Moderate mitral regurgitation. FINDINGS  Left Ventricle: Left ventricular ejection fraction, by estimation, is 30%. The left ventricle has moderately decreased function.  The left ventricle demonstrates global hypokinesis. The left ventricular internal cavity size was moderately dilated. There is no left ventricular hypertrophy. Left ventricular diastolic parameters are consistent with Grade II diastolic dysfunction (pseudonormalization). Elevated left atrial pressure. Right Ventricle: The right ventricular size is normal. Right vetricular wall thickness was not well visualized. Right ventricular systolic function is normal. Tricuspid regurgitation signal is inadequate for assessing PA pressure. Left Atrium: Left atrial size was mildly dilated. Right Atrium: Right atrial size was normal in size. Pericardium: There is no evidence of pericardial effusion. Mitral Valve: The mitral valve is abnormal. There is mild thickening of the mitral valve leaflet(s). There is mild calcification of the mitral valve leaflet(s). Mild mitral annular calcification. Mild mitral valve regurgitation. No evidence of mitral valve stenosis. MV peak gradient, 4.2 mmHg. The mean mitral valve gradient is 2.0 mmHg. Tricuspid Valve: The tricuspid valve is normal in structure. Tricuspid valve regurgitation is not demonstrated. No evidence of tricuspid stenosis. Aortic Valve: The aortic valve is tricuspid. Aortic valve regurgitation is not visualized. No aortic stenosis is present. Aortic valve mean gradient measures 5.0 mmHg. Aortic valve peak gradient measures 8.8 mmHg. Aortic valve area, by VTI measures 1.52 cm. Pulmonic Valve: The pulmonic valve was not well visualized. Pulmonic valve regurgitation is not visualized. No evidence of pulmonic stenosis. Aorta: The aortic root and ascending aorta are structurally normal, with no evidence of dilitation. Venous: The inferior vena cava is normal in size with greater than 50% respiratory variability, suggesting right atrial pressure of 3 mmHg. IAS/Shunts: No atrial level shunt detected by color flow Doppler. Additional Comments: A device lead is visualized in the right  atrium and right ventricle.  LEFT VENTRICLE PLAX 2D LVIDd:         6.40 cm      Diastology LVIDs:         6.10 cm      LV e' medial:    6.42 cm/s LV PW:         0.90 cm      LV E/e' medial:  13.2 LV IVS:  0.90 cm      LV e' lateral:   5.66 cm/s LVOT diam:     2.00 cm      LV E/e' lateral: 15.0 LV SV:         50 LV SV Index:   28 LVOT Area:     3.14 cm  LV Volumes (MOD) LV vol d, MOD A2C: 128.0 ml LV vol d, MOD A4C: 140.0 ml LV vol s, MOD A2C: 95.6 ml LV vol s, MOD A4C: 100.0 ml LV SV MOD A2C:     32.4 ml LV SV MOD A4C:     140.0 ml LV SV MOD BP:      37.3 ml RIGHT VENTRICLE RV Basal diam:  3.70 cm RV Mid diam:    3.00 cm RV S prime:     11.50 cm/s TAPSE (M-mode): 2.5 cm LEFT ATRIUM             Index        RIGHT ATRIUM           Index LA diam:        5.20 cm 2.89 cm/m   RA Area:     14.30 cm LA Vol (A2C):   76.2 ml 42.33 ml/m  RA Volume:   32.30 ml  17.94 ml/m LA Vol (A4C):   64.5 ml 35.83 ml/m LA Biplane Vol: 71.0 ml 39.44 ml/m  AORTIC VALVE                     PULMONIC VALVE AV Area (Vmax):    1.57 cm      PV Vmax:       0.91 m/s AV Area (Vmean):   1.37 cm      PV Peak grad:  3.3 mmHg AV Area (VTI):     1.52 cm AV Vmax:           148.00 cm/s AV Vmean:          104.000 cm/s AV VTI:            0.329 m AV Peak Grad:      8.8 mmHg AV Mean Grad:      5.0 mmHg LVOT Vmax:         73.90 cm/s LVOT Vmean:        45.400 cm/s LVOT VTI:          0.159 m LVOT/AV VTI ratio: 0.48  AORTA Ao Root diam: 3.00 cm Ao Asc diam:  3.20 cm MITRAL VALVE MV Area (PHT): 4.17 cm    SHUNTS MV Area VTI:   1.66 cm    Systemic VTI:  0.16 m MV Peak grad:  4.2 mmHg    Systemic Diam: 2.00 cm MV Mean grad:  2.0 mmHg MV Vmax:       1.03 m/s MV Vmean:      68.5 cm/s MV Decel Time: 182 msec MR Peak grad: 94.9 mmHg MR Mean grad: 64.0 mmHg MR Vmax:      487.00 cm/s MR Vmean:     380.0 cm/s MV E velocity: 84.80 cm/s MV A velocity: 61.30 cm/s MV E/A ratio:  1.38 Dina Rich MD Electronically signed by Dina Rich MD Signature  Date/Time: 10/31/2022/3:44:58 PM    Final    CUP PACEART REMOTE DEVICE CHECK  Result Date: 10/21/2022 Scheduled remote reviewed. Normal device function.  Known short burst of atrial lead noise Next remote 91 days. ML, CV[facility]

## 2022-11-05 NOTE — Patient Instructions (Addendum)
[facility] [place]  Discharge Instructions  You were seen and examined today by Dr. Ellin Saba. Dr. Ellin Saba is a hematologist, meaning that he specializes in blood abnormalities. Dr. Ellin Saba discussed your past medical history, family history of cancers/blood conditions and the events that led to you being here today.  You were referred to Dr. Ellin Saba due to anemia. Your iron is quite low, you could benefit from iron infusions which we will arrange here in the Cancer Center.  Follow-up as scheduled.  Thank you for choosing [facility] to provide your oncology and hematology care.   To afford each patient quality time with our provider, please arrive at least 15 minutes before your scheduled appointment time. You may need to reschedule your appointment if you arrive late (10 or more minutes). Arriving late affects you and other patients whose appointments are after yours.  Also, if you miss three or more appointments without notifying the office, you may be dismissed from the clinic at the provider's discretion.    Again, thank you for choosing [facility].  Our hope is that these requests will decrease the amount of time that you wait before being seen by our physicians.   If you have a lab appointment with the Cancer Center - please note that after April 8th, all labs will be drawn in the cancer center.  You do not have to check in or register with the main entrance as you have in the past but will complete your check-in at the cancer center.            _____________________________________________________________  Should you have questions after your visit to [facility], please contact our office at [phone] and follow the prompts.  Our office hours are 8:00 a.m. to 4:30 p.m. Monday - Thursday and 8:00 a.m. to 2:30 p.m. Friday.  Please note that voicemails left after 4:00 p.m. may not be returned until the  following business day.  We are closed weekends and all major holidays.  You do have access to a nurse 24-7, just call the main number to the clinic [phone] and do not press any options, hold on the line and a nurse will answer the phone.    For prescription refill requests, have your pharmacy contact our office and allow 72 hours.    Masks are no longer required in the cancer centers. If you would like for your care team to wear a mask while they are taking care of you, please let them know. You may have one support person who is at least 79 years old accompany you for your appointments.

## 2022-11-06 ENCOUNTER — Telehealth: Payer: Self-pay | Admitting: *Deleted

## 2022-11-06 NOTE — Telephone Encounter (Signed)
Er Dr. Ellin Saba, will add patient on for B 12 injection day of her Monoferric infusion.  In addition, he would like for her to begin taking B 12 1 mg tablet daily.  Patient made aware and verbalized understanding.

## 2022-11-07 ENCOUNTER — Ambulatory Visit: Payer: Medicare Other | Admitting: Cardiology

## 2022-11-08 LAB — COPPER, SERUM: Copper: 97 ug/dL (ref 80–158)

## 2022-11-11 ENCOUNTER — Inpatient Hospital Stay: Payer: Medicare Other

## 2022-11-11 LAB — METHYLMALONIC ACID, SERUM: Methylmalonic Acid, Quantitative: 1611 nmol/L — ABNORMAL HIGH (ref 0–378)

## 2022-11-11 NOTE — Progress Notes (Signed)
Patient presents today for iron infusion.  Patient is in satisfactory condition with no new complaints voiced.  Vital signs are stable.  IV placed in R arm.  IV flushed well with good blood return noted.     After IV insertion, patient informed nurse that she had been having chest pain since yesterday.  She stated that her L arm also felt "heavy".  MD was made aware.  Patient does have a cardiac history.   Patient advised to go to the ED.  Patient stated that she was not going to the ED, because she had been in the past and felt like this was stress related.  Dr. Ellin Saba notified again.  He suggested we wait until later in the week for iron in hopes her symptoms would subside.  Patient advised by RN to contact cardiologist and make them aware of her symptoms. Patient left ambulatory with rolling walker in stable condition.

## 2022-11-14 ENCOUNTER — Ambulatory Visit (INDEPENDENT_AMBULATORY_CARE_PROVIDER_SITE_OTHER): Payer: Medicare Other | Admitting: Urology

## 2022-11-14 VITALS — BP 101/63 | HR 87 | Ht 62.0 in | Wt 176.2 lb

## 2022-11-14 DIAGNOSIS — N952 Postmenopausal atrophic vaginitis: Secondary | ICD-10-CM

## 2022-11-14 DIAGNOSIS — N390 Urinary tract infection, site not specified: Secondary | ICD-10-CM

## 2022-11-14 DIAGNOSIS — N3946 Mixed incontinence: Secondary | ICD-10-CM | POA: Diagnosis not present

## 2022-11-14 DIAGNOSIS — Z87440 Personal history of urinary (tract) infections: Secondary | ICD-10-CM | POA: Diagnosis not present

## 2022-11-14 MED ORDER — ESTRADIOL 10 MCG VA TABS: 10.0000 ug | ORAL_TABLET | VAGINAL | 11 refills | Status: DC

## 2022-11-14 MED ORDER — FOSFOMYCIN TROMETHAMINE 3 G PO PACK: PACK | ORAL | 11 refills | Status: DC

## 2022-11-14 NOTE — Progress Notes (Signed)
Subjective: 1. Recurrent UTI   2. Mixed stress and urge urinary incontinence      11/14/22: Melissa Paul returns today in f/u for her history below.  She is on topical estrogen and has been on fosfomycin q10days for suppression.   She was seen in July and had Klebsiella that was sensitive to all meds tested but ampicillin.   Her Cr was 1.31 on 10/22/22.   KUB on 08/13/22 showed no stones. She has MUI and has prolapse. She has no dysuria but has chronic suprapubic pain. There are no aggravators.  She has improved bowel function.  She feels like she is voiding better but still has some urgency.   The topical estrogen still causes some burning.    Melissa Paul is a 77 yo female who is sent for recurrent UTI's with ESBL e. Coli on a culture in April with Mx antibiotic allergies including sulfa, PCN, keflex, Nitrofurantoin, levaquin and doxycycline.   She had a CT stone study on 04/23/22.  She has stable minimally complex bilateral renal cysts.  She was most recently treated with fosfomycin on 05/04/22 and had Cipro on 04/23/22 but was resistant.  She has bladder pressure and urgency and she has to stand and bend over to start her stream.  Her PVR is only 33ml.  She has prolapse and had a pessary but was unable to retain it.  She has been on topical estrogen but is off of it now.  She has no history of stones. She has had no GU surgery.  She has no pneumoturia or fecaluria.  She has had no hematuria.  She has had pyelonephritis in the past.  She has malodorous urine.  She wears pads for MUI.    ROS:  Review of Systems  Constitutional:  Positive for chills and malaise/fatigue.  Respiratory:  Positive for shortness of breath.   Cardiovascular:  Positive for leg swelling.  Gastrointestinal:  Positive for nausea.  Musculoskeletal:  Positive for joint pain.  Skin:  Positive for itching.  Neurological:  Positive for dizziness and weakness.  Psychiatric/Behavioral:  Positive for depression.   All other systems  reviewed and are negative.   Allergies  Allergen Reactions   Buspar [Buspirone] Other (See Comments)    Patient states that she became hyper and had lots of itching.   Lexapro [Escitalopram Oxalate] Other (See Comments)    Patient states that she became hyper and lots of itching   Trulicity [Dulaglutide] Hives and Itching    Tongue was numb.   Bactrim Rash   Cephalexin Swelling and Rash    Says she is able to take cefuroxime Throat swelling   Chlorhexidine Gluconate Hives and Itching    Hives, itching after using wipes with last procedure   Clindamycin Rash        Contrast Media [Iodinated Contrast Media] Rash   Crestor [Rosuvastatin] Hives and Rash   Doxycycline Rash   Latex Rash        Levofloxacin Rash        Nitrofurantoin Monohyd Macro Rash        Penicillins Swelling and Rash    Has patient had a PCN reaction causing immediate rash, facial/tongue/throat swelling, SOB or lightheadedness with hypotension: Yes Has patient had a PCN reaction causing severe rash involving mucus membranes or skin necrosis: Yes Has patient had a PCN reaction that required hospitalization: No Has patient had a PCN reaction occurring within the last 10 years: No Rash/swelled tongue If all of the  above answers are "NO", then may proceed with Cephalosporin use.     Past Medical History:  Diagnosis Date   Anxiety    Chronic systolic heart failure (HCC)    Constipation    Cyst of right kidney 11/12/2017   Glucose intolerance (pre-diabetes)    Hyperlipemia    Hypothyroidism    LBBB (left bundle branch block)    Mitral regurgitation    Mild to Moderate   Nonischemic cardiomyopathy (HCC)    LVEF 35-40% by echo 8/13   Paroxysmal atrial fibrillation (HCC)    Brief episodes by device interrogation   UTI (urinary tract infection)    Recurrent   Ventricular tachycardia (HCC)    ICD therapy for VT    Past Surgical History:  Procedure Laterality Date   APPENDECTOMY     BIV ICD  GENERATOR CHANGEOUT N/A 07/11/2016   Procedure: BiV ICD Generator Changeout;  Surgeon: Hillis Range, MD;  Location: [facility] INVASIVE CV LAB;  Service: Cardiovascular;  Laterality: N/A;   CARDIAC DEFIBRILLATOR PLACEMENT  09/2009   St. Jude BiV ICD by Dr. Shanna Cisco class III   CHOLECYSTECTOMY     has one ovary left     KNEE ARTHROSCOPY     Left   LEFT AND RIGHT HEART CATHETERIZATION WITH CORONARY ANGIOGRAM N/A 04/12/2014   Procedure: LEFT AND RIGHT HEART CATHETERIZATION WITH CORONARY ANGIOGRAM;  Surgeon: Marykay Lex, MD;  Location: [facility] CATH LAB;  Service: Cardiovascular;  Laterality: N/A;   Left breast biopsy x 2     no maliganancy   PACEMAKER IMPLANT     RIGHT HEART CATH N/A 10/27/2020   Procedure: RIGHT HEART CATH;  Surgeon: Laurey Morale, MD;  Location: [facility] INVASIVE CV LAB;  Service: Cardiovascular;  Laterality: N/A;   RIGHT/LEFT HEART CATH AND CORONARY ANGIOGRAPHY N/A 03/26/2019   Procedure: RIGHT/LEFT HEART CATH AND CORONARY ANGIOGRAPHY;  Surgeon: Laurey Morale, MD;  Location: [facility] INVASIVE CV LAB;  Service: Cardiovascular;  Laterality: N/A;   TOTAL ABDOMINAL HYSTERECTOMY     (R) ovary removed    Social History   Socioeconomic History   Marital status: Widowed    Spouse name: Not on file   Number of children: 2   Years of education: Not on file   Highest education level: Not on file  Occupational History   Not on file  Tobacco Use   Smoking status: Former    Current packs/day: 0.00    Average packs/day: 0.3 packs/day for 46.5 years (13.9 ttl pk-yrs)    Types: Cigarettes    Start date: 08/03/1949    Quit date: 01/22/1996    Years since quitting: 26.8    Passive exposure: Never   Smokeless tobacco: Never  Vaping Use   Vaping status: Never Used  Substance and Sexual Activity   Alcohol use: No    Alcohol/week: 0.0 standard drinks of alcohol   Drug use: No   Sexual activity: Not Currently    Birth control/protection: Surgical    Comment: hyst  Other Topics Concern   Not on  file  Social History Narrative   Her daughter, [name] lives with her - takes her to all visits and cooks for her.   Social Determinants of Health   Financial Resource Strain: Low Risk  (09/11/2022)   Overall Financial Resource Strain (CARDIA)    Difficulty of Paying Living Expenses: Not hard at all  Food Insecurity: No Food Insecurity (09/11/2022)   Hunger Vital Sign    Worried About Running Out  of Food in the Last Year: Never true    Ran Out of Food in the Last Year: Never true  Transportation Needs: No Transportation Needs (09/11/2022)   PRAPARE - Administrator, Civil Service (Medical): No    Lack of Transportation (Non-Medical): No  Physical Activity: Inactive (09/11/2022)   Exercise Vital Sign    Days of Exercise per Week: 0 days    Minutes of Exercise per Session: 0 min  Stress: No Stress Concern Present (09/11/2022)   [employer] of Occupational Health - Occupational Stress Questionnaire    Feeling of Stress : Not at all  Social Connections: Moderately Isolated (09/11/2022)   Social Connection and Isolation Panel [NHANES]    Frequency of Communication with Friends and Family: More than three times a week    Frequency of Social Gatherings with Friends and Family: More than three times a week    Attends Religious Services: 1 to 4 times per year    Active Member of [employer] or Organizations: No    Attends Banker Meetings: Never    Marital Status: Widowed  Intimate Partner Violence: Not At Risk (09/11/2022)   Humiliation, Afraid, Rape, and Kick questionnaire    Fear of Current or Ex-Partner: No    Emotionally Abused: No    Physically Abused: No    Sexually Abused: No    Family History  Problem Relation Age of Onset   CVA Mother        multiple   Heart Problems Mother        "weak heart"   Heart disease Maternal Grandmother    Heart disease Maternal Grandfather    Cancer Father        Stomach and eshpogus   Diabetes Neg Hx    Hypertension Neg Hx     Coronary artery disease Neg Hx     Anti-infectives: Anti-infectives (From admission, onward)    Start     Dose/Rate Route Frequency Ordered Stop   11/14/22 0000  fosfomycin (MONUROL) 3 g PACK       Note to Pharmacy: She is needs 1 packet every 10 days, #3 with 11 refills.      11/14/22 1551         Current Outpatient Medications  Medication Sig Dispense Refill   acetaminophen (TYLENOL) 650 MG CR tablet Take 650 mg by mouth every 8 (eight) hours as needed for pain.     albuterol (PROVENTIL) (2.5 MG/3ML) 0.083% nebulizer solution Take 3 mLs (2.5 mg total) by nebulization every 6 (six) hours as needed for wheezing or shortness of breath. 150 mL 1   apixaban (ELIQUIS) 5 MG TABS tablet Take 1 tablet (5 mg total) by mouth 2 (two) times daily. 180 tablet 2   blood glucose meter kit and supplies 1 each by Other route 2 (two) times daily as needed for other. Dispense based on patient and insurance preference. . (FOR ICD-10 E10.9, E11.9).Dispense based on patient and insurance preference. 1 each 0   carvedilol (COREG) 12.5 MG tablet Take 1 tablet (12.5 mg total) by mouth 2 (two) times daily. 180 tablet 1   Cyanocobalamin (B-12) 1000 MCG TABS Take by mouth.     dapagliflozin propanediol (FARXIGA) 10 MG TABS tablet Take 1 tablet (10 mg total) by mouth daily. 90 tablet 6   Estradiol 10 MCG TABS vaginal tablet Place 1 tablet (10 mcg total) vaginally 2 (two) times a week. 8 tablet 11   furosemide (LASIX) 80 MG tablet  Take 1 tablet (80 mg total) by mouth daily as needed for fluid. 90 tablet 1   glucose blood (GLUCOSE METER TEST) test strip Use BID 100 each 12   hydrOXYzine (VISTARIL) 25 MG capsule Take 1 capsule (25 mg total) by mouth every 8 (eight) hours as needed. 60 capsule 2   levothyroxine (SYNTHROID) 88 MCG tablet TAKE 1 TABLET BY MOUTH ONCE DAILY BEFORE BREAKFAST 90 tablet 0   metFORMIN (GLUCOPHAGE-XR) 750 MG 24 hr tablet Take 1 tablet (750 mg total) by mouth 2 (two) times daily. 60 tablet 1    mupirocin ointment (BACTROBAN) 2 % Apply 1 Application topically 2 (two) times daily. 22 g 0   pantoprazole (PROTONIX) 40 MG tablet TAKE 1 TABLET BY MOUTH ONCE DAILY BEFORE BREAKFAST (Patient taking differently: Take 40 mg by mouth daily as needed (reflux).) 90 tablet 1   polyethylene glycol (MIRALAX) 17 g packet Take 17 g by mouth daily. (Patient taking differently: Take 17 g by mouth daily as needed for mild constipation or moderate constipation.) 14 each 0   potassium chloride SA (KLOR-CON M) 20 MEQ tablet Take 1 tablet (20 mEq total) by mouth as directed. Only take 1 tablet with extra doses of Lasix 30 tablet 3   spironolactone (ALDACTONE) 25 MG tablet Take 1 tablet (25 mg total) by mouth daily. 90 tablet 1   fosfomycin (MONUROL) 3 g PACK Take 3gm po every 10 days. 3 each 11   No current facility-administered medications for this visit.     Objective: Vital signs in last 24 hours: BP 101/63   Pulse 87   Ht 5\' 2"  (1.575 m)   Wt 176 lb 3.2 oz (79.9 kg)   BMI 32.23 kg/m   Intake/Output from previous day: No intake/output data recorded. Intake/Output this shift: @IOTHISSHIFT @   Physical Exam Vitals reviewed.  Constitutional:      Appearance: Normal appearance.  Neurological:     Mental Status: She is alert.     Lab Results:  Results for orders placed or performed in visit on 11/14/22 (from the past 24 hour(s))  Urinalysis, Routine w reflex microscopic     Status: Abnormal   Collection Time: 11/14/22  4:04 PM  Result Value Ref Range   Specific Gravity, UA 1.010 1.005 - 1.030   pH, UA 6.0 5.0 - 7.5   Color, UA Yellow Yellow   Appearance Ur Clear Clear   Leukocytes,UA Negative Negative   Protein,UA Negative Negative/Trace   Glucose, UA 3+ (A) Negative   Ketones, UA Negative Negative   RBC, UA Negative Negative   Bilirubin, UA Negative Negative   Urobilinogen, Ur 0.2 0.2 - 1.0 mg/dL   Nitrite, UA Negative Negative   Microscopic Examination Comment    Narrative    Performed at:  [address] - [facility] [address] [place], [place]  [id number] Lab Director: Chinita Pester MT, Phone:  [phone]   *Note: Due to a large number of results and/or encounters for the requested time period, some results have not been displayed. A complete set of results can be found in Results Review.     BMET No results for input(s): "NA", "K", "CL", "CO2", "GLUCOSE", "BUN", "CREATININE", "CALCIUM" in the last 72 hours. PT/INR No results for input(s): "LABPROT", "INR" in the last 72 hours. ABG No results for input(s): "PHART", "HCO3" in the last 72 hours.  Invalid input(s): "PCO2", "PO2"     Assessment/Plan: Recurrent UTI with ESBL e. Coli and multiple drug allergies.   Cath  UA today.  I will start fosfomycin q10 days for suppression since her UA is clear and she got relief with the fosfomycin from her recent UTI. .  Vaginal atrophy with moderate prolapse.  I will change her to vagifem.   MUI.  This is getting worse.   I will have her go for urodynamcis. .   Meds ordered this encounter  Medications   Estradiol 10 MCG TABS vaginal tablet    Sig: Place 1 tablet (10 mcg total) vaginally 2 (two) times a week.    Dispense:  8 tablet    Refill:  11   fosfomycin (MONUROL) 3 g PACK    Sig: Take 3gm po every 10 days.    Dispense:  3 each    Refill:  11    She is needs 1 packet every 10 days, #3 with 11 refills.     Orders Placed This Encounter  Procedures   Urine Culture   Urinalysis, Routine w reflex microscopic   Ambulatory referral to Urology    Referral Priority:   Routine    Referral Type:   Consultation    Referral Reason:   Specialty Services Required    Referred to Provider:   Bjorn Pippin, MD    Requested Specialty:   Urology    Number of Visits Requested:   1   In and Out Cath     Return for Next available with Urodynamics results. .    CC: [name] NP      Bjorn Pippin 11/15/2022

## 2022-11-14 NOTE — Progress Notes (Signed)
Pt is prepped for and in and out catherization. Patient was cleaned and prepped in a sterle fashion with betadine. A 14 fr catheter foley was inserted. Urine return was note 60 ml.  Performed by Guss Bunde, CMA  Urine sent ua/uc per MD

## 2022-11-15 LAB — URINALYSIS, ROUTINE W REFLEX MICROSCOPIC
Bilirubin, UA: NEGATIVE
Ketones, UA: NEGATIVE
Leukocytes,UA: NEGATIVE
Nitrite, UA: NEGATIVE
Protein,UA: NEGATIVE
RBC, UA: NEGATIVE
Specific Gravity, UA: 1.01 (ref 1.005–1.030)
Urobilinogen, Ur: 0.2 mg/dL (ref 0.2–1.0)
pH, UA: 6 (ref 5.0–7.5)

## 2022-11-18 ENCOUNTER — Ambulatory Visit: Payer: Medicare Other | Attending: Cardiovascular Disease

## 2022-11-18 ENCOUNTER — Encounter: Payer: Self-pay | Admitting: Cardiology

## 2022-11-18 ENCOUNTER — Ambulatory Visit: Payer: Medicare Other | Attending: Cardiology | Admitting: Cardiology

## 2022-11-18 VITALS — BP 118/62 | HR 94 | Ht 63.5 in | Wt 174.2 lb

## 2022-11-18 DIAGNOSIS — I428 Other cardiomyopathies: Secondary | ICD-10-CM | POA: Insufficient documentation

## 2022-11-18 DIAGNOSIS — I480 Paroxysmal atrial fibrillation: Secondary | ICD-10-CM | POA: Diagnosis not present

## 2022-11-18 DIAGNOSIS — I5022 Chronic systolic (congestive) heart failure: Secondary | ICD-10-CM | POA: Diagnosis not present

## 2022-11-18 DIAGNOSIS — I5020 Unspecified systolic (congestive) heart failure: Secondary | ICD-10-CM | POA: Diagnosis not present

## 2022-11-18 DIAGNOSIS — Z95810 Presence of automatic (implantable) cardiac defibrillator: Secondary | ICD-10-CM

## 2022-11-18 NOTE — Progress Notes (Signed)
Cardiology Office Note  Date: 11/18/2022   ID: MARGOT PFINGSTEN, DOB 04-11-1945, MRN [id number]  History of Present Illness: Melissa Paul is a 77 y.o. female last seen in April.  She is here for a follow-up visit.  Reports no palpitations.  States that she was supposed to undergo an iron infusion on October 21 but this was canceled as she was describing chest heaviness and left arm discomfort.  She states that this has not been a recurring issue since then, she felt like she was "very stressed" about the situation.  She does have a history of recurrent chest pain however no significant CAD by cardiac catheterization within the last 3 years.  Some of her symptoms are related to relative fluid overload, her weight is actually down a few pounds and she reports NYHA class II dyspnea at this time.  St. Jude biventricular ICD in place was followed by Dr. Nelly Laurence.  Device interrogation in September showed normal function.  She had a follow-up visit with Dr. Nelly Laurence in August, I reviewed the note.  No device shocks or syncope.  I reviewed her medications.  Current cardiac regimen includes Eliquis, Coreg, Farxiga, Lasix, and Aldactone.  I reviewed her interval lab work.  Physical Exam: VS:  BP 118/62 (BP Location: Left Arm)   Pulse 94   Ht 5' 3.5" (1.613 m)   Wt 174 lb 3.2 oz (79 kg)   SpO2 100%   BMI 30.37 kg/m , BMI Body mass index is 30.37 kg/m.  Wt Readings from Last 3 Encounters:  11/18/22 174 lb 3.2 oz (79 kg)  11/14/22 176 lb 3.2 oz (79.9 kg)  11/05/22 176 lb 3.2 oz (79.9 kg)    General: Patient appears comfortable at rest. HEENT: Conjunctiva and lids normal. Neck: Supple, no elevated JVP or carotid bruits. Lungs: Clear to auscultation, nonlabored breathing at rest. Cardiac: Regular rate and rhythm, no S3 or significant systolic murmur. Extremities: No pitting edema.  ECG:  An ECG dated 08/23/2022 was personally reviewed today and demonstrated:  Dual-chamber paced  rhythm.  Labwork: 07/08/2022: Magnesium 2.0; TSH 2.534 07/21/2022: B Natriuretic Peptide 487.0 10/22/2022: ALT 5; AST 11; BUN 25; Creatinine, Ser 1.31; Hemoglobin 9.3; Platelets 216; Potassium 4.6; Sodium 139     Component Value Date/Time   CHOL 160 10/12/2021 1613   TRIG 148 10/12/2021 1613   HDL 37 (L) 10/12/2021 1613   CHOLHDL 4.3 10/12/2021 1613   CHOLHDL 3.3 04/11/2007 0600   VLDL 10 04/11/2007 0600   LDLCALC 97 10/12/2021 1613   Other Studies Reviewed Today:  Echocardiogram 10/30/2022:  1. Left ventricular ejection fraction, by estimation, is 30%. The left  ventricle has moderately decreased function. The left ventricle  demonstrates global hypokinesis. The left ventricular internal cavity size  was moderately dilated. Left ventricular  diastolic parameters are consistent with Grade II diastolic dysfunction  (pseudonormalization). Elevated left atrial pressure.   2. Right ventricular systolic function is normal. The right ventricular  size is normal. Tricuspid regurgitation signal is inadequate for assessing  PA pressure.   3. Left atrial size was mildly dilated.   4. The mitral valve is abnormal. Mild mitral valve regurgitation. No  evidence of mitral stenosis.   5. The aortic valve is tricuspid. Aortic valve regurgitation is not  visualized. No aortic stenosis is present.   6. The inferior vena cava is normal in size with greater than 50%  respiratory variability, suggesting right atrial pressure of 3 mmHg.   Assessment and Plan:  1.  HFrEF with nonischemic cardiomyopathy, LVEF 30% by follow-up echocardiogram in October of this year which is stable.  Did not tolerate Entresto previously due to lightheadedness and hypotension.  She reports NYHA class II dyspnea, no increasing fluid overload with weight down a few pounds on today's assessment.  Current regimen includes Coreg, Farxiga, Lasix, and Aldactone.   2.  St. Jude biventricular ICD in place with follow-up with Dr.  Nelly Laurence.  No device shocks or syncope.   3.  At risk for OSA, she has declined sleep testing.   4.  Paroxysmal atrial fibrillation with CHA2DS2-VASc score of 6.  She is asymptomatic in terms of palpitations.  Continue Eliquis for stroke prophylaxis.   5.  Mitral regurgitation, mild by follow-up echocardiogram in October of this year.  Disposition:  Follow up  6 months.  Signed, [name], M.D., F.A.C.C. [facility]

## 2022-11-18 NOTE — Patient Instructions (Addendum)

## 2022-11-19 ENCOUNTER — Telehealth: Payer: Self-pay

## 2022-11-19 LAB — URINE CULTURE

## 2022-11-19 NOTE — Telephone Encounter (Signed)
-----   Message from Bjorn Pippin sent at 11/15/2022  8:21 AM EDT ----- Her cath UA had 3+ glucose from her diabetes but was otherwise clear without evidence of infection.  Continue fosfomycin

## 2022-11-19 NOTE — Telephone Encounter (Signed)
Patient is aware of MD's response and voiced understanding.

## 2022-11-20 LAB — HM MAMMOGRAPHY

## 2022-11-21 ENCOUNTER — Telehealth: Payer: Self-pay

## 2022-11-21 NOTE — Telephone Encounter (Signed)
-----   Message from Bjorn Pippin sent at 11/20/2022  4:55 PM EDT ----- Fosfomycin is fine. ----- Message ----- From: Grier Rocher, CMA Sent: 11/19/2022   1:15 PM EDT To: Bjorn Pippin, MD  Please review pt started on fosfomycin on 10/24

## 2022-11-21 NOTE — Telephone Encounter (Signed)
Patient is aware of MD response to culture and will continue abx as prescribed.

## 2022-11-22 ENCOUNTER — Telehealth: Payer: Self-pay

## 2022-11-22 NOTE — Progress Notes (Signed)
EPIC Encounter for ICM Monitoring  Patient Name: LOIS SLAGEL is a 77 y.o. female Date: 11/22/2022 Primary Care Physican: Junie Spencer, FNP Primary Cardiologist: [place] Electrophysiologist: Mealor Bi-V Pacing: 89%        01/01/2022 Weight: 178 lbs 03/15/2022 Weight: 170 lbs 05/24/2022 Weight:  162 lbs 06/25/2022  Weight:  165 lbs 07/16/2022 Weight: 170 lbs      AT/AF Burden: <1% (taking Eliquis)          Attempted call to patient and unable to reach.  Transmission reviewed.     CorVue Thoracic impedance suggesting normal fluid accumulation with the exception of possible fluid accumulation from 10/24-10/26.   Prescribed:  Furosemide 80 mg Take 80 mg by mouth daily as needed.  Pt takes extra if needed.   06/25/22 She is taking about twice a week due to her body can't handle Furosemide daily.   Potassium 20 mEq take 1 tablet by mouth as directed.  Only take 1 tablet with extra doses of Lasix. Spirolactone 25 mg take 1 tablet by mouth daily   Labs: 08/13/2022 Creatinine 1.52, BUN 30, Potassium 3.8, Sodium 138, GFR 35 07/21/2022 Creatinine 1.07, BUN 13, Potassium 3.9, Sodium 138, GFR 53 07/08/2022 Creatinine 1.09, BUN 16, Potassium 3.6, Sodium 135, GFR 52 05/04/2022 Creatinine 1.40, BUN 23, Potassium 4.6, Sodium 134, GFR 39  04/23/2022 Creatinine 1.18, BUN 22, Potassium 3.4, Sodium 136, GFR 47  A complete set of results can be found in Results Review.   Recommendations:  Unable to reach.     Follow-up plan: ICM clinic phone appointment on 12/23/2022.   91 day device clinic remote transmission 01/13/2023.    EP/Cardiology Office Visits:   11/29/2022 with Dr Nelly Laurence.  Recall 05/17/2023 with Dr Diona Browner.     Copy of ICM check sent to Dr. Nelly Laurence.      3 month ICM trend: 11/18/2022.    12-14 Month ICM trend:     Karie Soda, RN 11/22/2022 12:25 PM

## 2022-11-22 NOTE — Telephone Encounter (Signed)
Remote ICM transmission received.  Attempted call to patient regarding ICM remote transmission and person answering phone stated she was not home.   

## 2022-11-26 ENCOUNTER — Encounter: Payer: Self-pay | Admitting: Family

## 2022-11-29 ENCOUNTER — Encounter: Payer: Self-pay | Admitting: Cardiovascular Disease

## 2022-11-29 ENCOUNTER — Ambulatory Visit: Payer: Medicare Other | Attending: Cardiovascular Disease | Admitting: Cardiovascular Disease

## 2022-11-29 VITALS — BP 116/62 | HR 70 | Ht 62.5 in | Wt 175.6 lb

## 2022-11-29 DIAGNOSIS — Z95810 Presence of automatic (implantable) cardiac defibrillator: Secondary | ICD-10-CM | POA: Diagnosis present

## 2022-11-29 NOTE — Patient Instructions (Addendum)
Medication Instructions:  ?Your physician recommends that you continue on your current medications as directed. Please refer to the Current Medication list given to you today.  ? ?Labwork: ?none ? ?Testing/Procedures: ?none ? ?Follow-Up: ?Your physician recommends that you schedule a follow-up appointment in: 1 year ? ?You will receive a reminder call in about 10 months reminding you to schedule your appointment. If you don't receive this call, please contact our office. ? ?Any Other Special Instructions Will Be Listed Below (If Applicable). ? ?If you need a refill on your cardiac medications before your next appointment, please call your pharmacy. ? ?

## 2022-11-29 NOTE — Progress Notes (Signed)
  Electrophysiology Office Note:    Date:  11/29/2022   ID:  Melissa Paul, DOB 1945-06-28, MRN [id number]  PCP:  Junie Spencer, FNP   [facility] Providers Cardiologist:  Nona Dell, MD Electrophysiologist:  Maurice Small, MD     Referring MD: Junie Spencer, FNP   History of Present Illness:    Melissa Paul is a 77 y.o. 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     She has a Abbott CRT D device in place.  He has had VT/VF in the past.  Additionally, she has a diagnosed with atrial fibrillation  Noted to have atrial lead noise in the past, so her atrial sensing threshold was increased.  On interrogation today, she has under sensed beats.  She has also had inappropriate atrial mode switch episodes because she is still symptomatic noise occasionally.     Since her last visit, she has been doing reasonably well.  She does have occasional palpitations --irregular beats, and sensation of heart racing briefly.  Her primary complaint is fatigue, generally feeling unwell, nausea.  She also has had some hacking cough.  No fever.      EKGs/Labs/Other Studies Reviewed Today:    Echocardiogram:  TTE October 2023 Ejection fraction 25 to 30%.  Grade 2 diastolic dysfunction.  Left atrium moderately dilated.  TTE October 2024 Ejection fraction 30%.  Grade 2 diastolic dysfunction.  Left atrium mildly dilated.  Monitors:   Stress testing:   Advanced imaging:   Cardiac catherization    EKG:         Physical Exam:    VS:  BP 116/62 (BP Location: Right Arm)   Pulse 70   Ht 5' 2.5" (1.588 m)   Wt 175 lb 9.6 oz (79.7 kg)   SpO2 98%   BMI 31.61 kg/m     Wt Readings from Last 3 Encounters:  11/29/22 175 lb 9.6 oz (79.7 kg)  11/18/22 174 lb 3.2 oz (79 kg)  11/14/22 176 lb 3.2 oz (79.9 kg)     GEN: Well nourished, well developed in no acute  distress CARDIAC: RRR, no murmurs, rubs, gallops The device site is normal -- no tenderness, edema, drainage, redness, threatened erosion.  RESPIRATORY:  Normal work of breathing MUSCULOSKELETAL: 1+ edema    ASSESSMENT & PLAN:    Systolic heart failure CRT D device in place On carvedilol 12.5, dapagliflozin 10, spironolactone 25 EF 30% on most recent echo, October 2024  Abbott CRT-D Known atrial lead noise leading to false AMS We previously increased her AV sensitivity addressed with programming Is not device dependent today.  Paroxysmal atrial fibrillation Short episodes noted on interrogation On Eliquis for CHA2DS2-VASc score of 6 10/1 Labs reviewed   Malaise, fatigue Suspect anemia for which she is currently under management No dark stools, Homero Fellers GI bleeding   Signed, Maurice Small, MD  11/29/2022 11:34 AM    [facility]

## 2022-12-06 ENCOUNTER — Inpatient Hospital Stay: Payer: Medicare Other | Attending: Physician Assistant

## 2022-12-06 VITALS — BP 103/65 | HR 68 | Temp 98.0°F | Resp 18

## 2022-12-06 DIAGNOSIS — Z79899 Other long term (current) drug therapy: Secondary | ICD-10-CM | POA: Insufficient documentation

## 2022-12-06 DIAGNOSIS — E538 Deficiency of other specified B group vitamins: Secondary | ICD-10-CM | POA: Diagnosis not present

## 2022-12-06 DIAGNOSIS — D509 Iron deficiency anemia, unspecified: Secondary | ICD-10-CM | POA: Insufficient documentation

## 2022-12-06 MED ORDER — CYANOCOBALAMIN 1000 MCG/ML IJ SOLN
1000.0000 ug | Freq: Once | INTRAMUSCULAR | Status: AC
  Administered 2022-12-06: 1000 ug via INTRAMUSCULAR
  Filled 2022-12-06: qty 1

## 2022-12-06 MED ORDER — METHYLPREDNISOLONE SODIUM SUCC 125 MG IJ SOLR
125.0000 mg | Freq: Once | INTRAMUSCULAR | Status: AC
  Administered 2022-12-06: 125 mg via INTRAVENOUS
  Filled 2022-12-06: qty 2

## 2022-12-06 MED ORDER — SODIUM CHLORIDE 0.9 % IV SOLN
1000.0000 mg | Freq: Once | INTRAVENOUS | Status: AC
  Administered 2022-12-06: 1000 mg via INTRAVENOUS
  Filled 2022-12-06: qty 1000

## 2022-12-06 MED ORDER — FAMOTIDINE IN NACL 20-0.9 MG/50ML-% IV SOLN
20.0000 mg | Freq: Once | INTRAVENOUS | Status: AC
  Administered 2022-12-06: 20 mg via INTRAVENOUS
  Filled 2022-12-06: qty 50

## 2022-12-06 MED ORDER — SODIUM CHLORIDE 0.9 % IV SOLN: INTRAVENOUS | Status: DC

## 2022-12-06 NOTE — Progress Notes (Signed)
Patient tolerated iron infusion with no complaints voiced.  Peripheral IV site clean and dry with good blood return noted before and after infusion.  Band aid applied.  VSS with discharge and left in satisfactory condition with no s/s of distress noted.   

## 2022-12-06 NOTE — Progress Notes (Signed)
Patient presents today for iron infusion and Vitamin B12 injection.  Patient is in satisfactory condition with no new complaints voiced.  Vital signs are stable.  IV placed in R arm.  IV flushed well with good blood return noted.  Patient had Benadryl 25 mg PO at 1200.  We will hold IV Zyrtec today per Pryor Ochoa, [facility].  We will proceed with infusion per provider orders.    Patient tolerated injection with no complaints voiced.  Site clean and dry with no bruising or swelling noted.  No complaints of pain.

## 2022-12-06 NOTE — Patient Instructions (Signed)
[facility] - A DEPT OF [facility]  Discharge Instructions: Thank you for choosing [facility] to provide your oncology and hematology care.  If you have a lab appointment with the Cancer Center - please note that after April 8th, 2024, all labs will be drawn in the cancer center.  You do not have to check in or register with the main entrance as you have in the past but will complete your check-in in the cancer center.  Wear comfortable clothing and clothing appropriate for easy access to any Portacath or PICC line.   We strive to give you quality time with your provider. You may need to reschedule your appointment if you arrive late (15 or more minutes).  Arriving late affects you and other patients whose appointments are after yours.  Also, if you miss three or more appointments without notifying the office, you may be dismissed from the clinic at the provider's discretion.      For prescription refill requests, have your pharmacy contact our office and allow 72 hours for refills to be completed.    Today you received the following:  Monoferric.  Ferric Derisomaltose Injection What is this medication? FERRIC DERISOMALTOSE (FER ik der EYE soe MAWL tose) treats low levels of iron in your body (iron deficiency anemia). Iron is a mineral that plays an important role in making red blood cells, which carry oxygen from your lungs to the rest of your body. This medicine may be used for other purposes; ask your health care provider or pharmacist if you have questions. COMMON BRAND NAME(S): MONOFERRIC What should I tell my care team before I take this medication? They need to know if you have any of these conditions: High levels of iron in the blood An unusual or allergic reaction to iron, other medications, foods, dyes, or preservatives Pregnant or trying to get pregnant Breastfeeding How should I use this medication? This medication is injected into  a vein. It is given by your care team in a hospital or clinic setting. Talk to your care team about the use of this medication in children. Special care may be needed. Overdosage: If you think you have taken too much of this medicine contact a poison control center or emergency room at once. NOTE: This medicine is only for you. Do not share this medicine with others. What if I miss a dose? It is important not to miss your dose. Call your care team if you are unable to keep an appointment. What may interact with this medication? Do not take this medication with any of the following: Deferoxamine Dimercaprol Other iron products This list may not describe all possible interactions. Give your health care provider a list of all the medicines, herbs, non-prescription drugs, or dietary supplements you use. Also tell them if you smoke, drink alcohol, or use illegal drugs. Some items may interact with your medicine. What should I watch for while using this medication? Visit your care team regularly. Tell your care team if your symptoms do not start to get better or if they get worse. You may need blood work done while you are taking this medication. You may need to follow a special diet. Talk to your care team. Foods that contain iron include whole grains/cereals, dried fruits, beans, or peas, leafy green vegetables, and organ meats (liver, kidney). What side effects may I notice from receiving this medication? Side effects that you should report to your care team  as soon as possible: Allergic reactions--skin rash, itching, hives, swelling of the face, lips, tongue, or throat Low blood pressure--dizziness, feeling faint or lightheaded, blurry vision Shortness of breath Side effects that usually do not require medical attention (report to your care team if they continue or are bothersome): Flushing Headache Joint pain Muscle pain Nausea Pain, redness, or irritation at injection site This list may  not describe all possible side effects. Call your doctor for medical advice about side effects. You may report side effects to FDA at 1-800-FDA-1088. Where should I keep my medication? This medication is given in a hospital or clinic. It will not be stored at home. NOTE: This sheet is a summary. It may not cover all possible information. If you have questions about this medicine, talk to your doctor, pharmacist, or health care provider.  2024 Elsevier/Gold Standard (2022-06-14 00:00:00)     To help prevent nausea and vomiting after your treatment, we encourage you to take your nausea medication as directed.  BELOW ARE SYMPTOMS THAT SHOULD BE REPORTED IMMEDIATELY: *FEVER GREATER THAN 100.4 F (38 C) OR HIGHER *CHILLS OR SWEATING *NAUSEA AND VOMITING THAT IS NOT CONTROLLED WITH YOUR NAUSEA MEDICATION *UNUSUAL SHORTNESS OF BREATH *UNUSUAL BRUISING OR BLEEDING *URINARY PROBLEMS (pain or burning when urinating, or frequent urination) *BOWEL PROBLEMS (unusual diarrhea, constipation, pain near the anus) TENDERNESS IN MOUTH AND THROAT WITH OR WITHOUT PRESENCE OF ULCERS (sore throat, sores in mouth, or a toothache) UNUSUAL RASH, SWELLING OR PAIN  UNUSUAL VAGINAL DISCHARGE OR ITCHING   Items with * indicate a potential emergency and should be followed up as soon as possible or go to the Emergency Department if any problems should occur.  Please show the CHEMOTHERAPY ALERT CARD or IMMUNOTHERAPY ALERT CARD at check-in to the Emergency Department and triage nurse.  Should you have questions after your visit or need to cancel or reschedule your appointment, please contact [facility] - A DEPT OF [facility] [phone]  and follow the prompts.  Office hours are 8:00 a.m. to 4:30 p.m. Monday - Friday. Please note that voicemails left after 4:00 p.m. may not be returned until the following business day.  We are closed weekends and major holidays. You have access to a  nurse at all times for urgent questions. Please call the main number to the clinic [phone] and follow the prompts.  For any non-urgent questions, you may also contact your provider using MyChart. We now offer e-Visits for anyone 34 and older to request care online for non-urgent symptoms. For details visit [email or web address].   Also download the MyChart app! Go to the app store, search "MyChart", open the app, select [facility], and log in with your MyChart username and password.

## 2022-12-10 ENCOUNTER — Emergency Department (HOSPITAL_COMMUNITY): Payer: Medicare Other

## 2022-12-10 ENCOUNTER — Encounter (HOSPITAL_COMMUNITY): Payer: Self-pay | Admitting: Emergency Medicine

## 2022-12-10 ENCOUNTER — Emergency Department (HOSPITAL_COMMUNITY)
Admission: EM | Admit: 2022-12-10 | Discharge: 2022-12-10 | Disposition: A | Discharge status: Home / Self Care | Payer: Medicare Other | Attending: Emergency Medicine | Admitting: Emergency Medicine

## 2022-12-10 ENCOUNTER — Other Ambulatory Visit: Payer: Self-pay

## 2022-12-10 DIAGNOSIS — Z7901 Long term (current) use of anticoagulants: Secondary | ICD-10-CM | POA: Diagnosis not present

## 2022-12-10 DIAGNOSIS — Z91040 Latex allergy status: Secondary | ICD-10-CM | POA: Insufficient documentation

## 2022-12-10 DIAGNOSIS — N3080 Other cystitis without hematuria: Secondary | ICD-10-CM | POA: Insufficient documentation

## 2022-12-10 DIAGNOSIS — R300 Dysuria: Secondary | ICD-10-CM | POA: Diagnosis present

## 2022-12-10 LAB — CBC WITH DIFFERENTIAL/PLATELET
Abs Immature Granulocytes: 0.12 10*3/uL — ABNORMAL HIGH (ref 0.00–0.07)
Basophils Absolute: 0 10*3/uL (ref 0.0–0.1)
Basophils Relative: 0 %
Eosinophils Absolute: 0.1 10*3/uL (ref 0.0–0.5)
Eosinophils Relative: 1 %
HCT: 31.6 % — ABNORMAL LOW (ref 36.0–46.0)
Hemoglobin: 9.2 g/dL — ABNORMAL LOW (ref 12.0–15.0)
Immature Granulocytes: 2 %
Lymphocytes Relative: 33 %
Lymphs Abs: 2.3 10*3/uL (ref 0.7–4.0)
MCH: 23.9 pg — ABNORMAL LOW (ref 26.0–34.0)
MCHC: 29.1 g/dL — ABNORMAL LOW (ref 30.0–36.0)
MCV: 82.1 fL (ref 80.0–100.0)
Monocytes Absolute: 0.8 10*3/uL (ref 0.1–1.0)
Monocytes Relative: 11 %
Neutro Abs: 3.7 10*3/uL (ref 1.7–7.7)
Neutrophils Relative %: 53 %
Platelets: 231 10*3/uL (ref 150–400)
RBC: 3.85 MIL/uL — ABNORMAL LOW (ref 3.87–5.11)
RDW: 18.2 % — ABNORMAL HIGH (ref 11.5–15.5)
WBC: 7 10*3/uL (ref 4.0–10.5)
nRBC: 0 % (ref 0.0–0.2)

## 2022-12-10 LAB — BASIC METABOLIC PANEL
Anion gap: 10 (ref 5–15)
BUN: 13 mg/dL (ref 8–23)
CO2: 30 mmol/L (ref 22–32)
Calcium: 8.4 mg/dL — ABNORMAL LOW (ref 8.9–10.3)
Chloride: 100 mmol/L (ref 98–111)
Creatinine, Ser: 0.97 mg/dL (ref 0.44–1.00)
GFR, Estimated: 59 mL/min — ABNORMAL LOW (ref >60)
Glucose, Bld: 138 mg/dL — ABNORMAL HIGH (ref 70–99)
Potassium: 3.5 mmol/L (ref 3.5–5.1)
Sodium: 140 mmol/L (ref 135–145)

## 2022-12-10 LAB — URINALYSIS, ROUTINE W REFLEX MICROSCOPIC
Bilirubin Urine: NEGATIVE
Glucose, UA: >=500 mg/dL — AB
Ketones, ur: NEGATIVE mg/dL
Nitrite: NEGATIVE
Protein, ur: 100 mg/dL — AB
Specific Gravity, Urine: 1.018 (ref 1.005–1.030)
WBC, UA: >50 WBC/hpf (ref 0–5)
pH: 6 (ref 5.0–8.0)

## 2022-12-10 MED ORDER — FOSFOMYCIN TROMETHAMINE 3 G PO PACK
3.0000 g | PACK | Freq: Once | ORAL | Status: AC
  Administered 2022-12-10: 3 g via ORAL
  Filled 2022-12-10: qty 3

## 2022-12-10 MED ORDER — HYDROCODONE-ACETAMINOPHEN 5-325 MG PO TABS: 1.0000 | ORAL_TABLET | Freq: Four times a day (QID) | ORAL | 0 refills | Status: DC | PRN

## 2022-12-10 MED ORDER — GENTAMICIN SULFATE 40 MG/ML IJ SOLN
5.0000 mg/kg | Freq: Once | INTRAVENOUS | Status: AC
  Administered 2022-12-10: 400 mg via INTRAVENOUS
  Filled 2022-12-10: qty 10

## 2022-12-10 MED ORDER — HYDROCODONE-ACETAMINOPHEN 5-325 MG PO TABS
1.0000 | ORAL_TABLET | Freq: Once | ORAL | Status: AC
  Administered 2022-12-10: 1 via ORAL
  Filled 2022-12-10: qty 1

## 2022-12-10 NOTE — Discharge Instructions (Addendum)
You have an infection in your bladder that requires an antibiotic different from your Fosfomycin. Because of your multiple allergies, you will need to receive this treatment in the office by intramuscular injections. Call Dr. Belva Crome office in the morning to schedule a time to be seen tomorrow to continue treatment.   If you develop a fever, begin having vomiting, have severe pain or new concern for worsening illness, return to the emergency department immediately.

## 2022-12-10 NOTE — ED Provider Notes (Addendum)
[facility] EMERGENCY DEPARTMENT AT [facility] Provider Note   CSN: [id number] Arrival date & time: 12/10/22  1313     History  Chief Complaint  Patient presents with   Urinary Tract Infection    Melissa Paul is a 77 y.o. female.  Patient with frequent UTI's presents with symptoms familiar to her as another urinary infection. No fever. No vomiting. She reports suprapubic pain, dysuria and frequency of urination. She denies right or left flank pain stating she has had kidney infections before but this feels like a bladder infection to her. She is taking Fosfomycin regularly every 10 days as a preventative and secondary to multiple antibiotic allergies. Her next dose is due on the 24th.    Urinary Tract Infection      Home Medications Prior to Admission medications   Medication Sig Start Date End Date Taking? Authorizing Provider  HYDROcodone-acetaminophen (NORCO) 5-325 MG tablet Take 1 tablet by mouth every 6 (six) hours as needed for moderate pain (pain score 4-6). 12/10/22  Yes Elpidio Anis, PA-C  acetaminophen (TYLENOL) 650 MG CR tablet Take 650 mg by mouth every 8 (eight) hours as needed for pain.    [provider]  albuterol (PROVENTIL) (2.5 MG/3ML) 0.083% nebulizer solution Take 3 mLs (2.5 mg total) by nebulization every 6 (six) hours as needed for wheezing or shortness of breath. 01/09/22   Gabriel Earing, FNP  apixaban (ELIQUIS) 5 MG TABS tablet Take 1 tablet (5 mg total) by mouth 2 (two) times daily. 10/22/22   [name] A, FNP  blood glucose meter kit and supplies 1 each by Other route 2 (two) times daily as needed for other. Dispense based on patient and insurance preference. . (FOR ICD-10 E10.9, E11.9).Dispense based on patient and insurance preference. 07/30/21   [name] A, FNP  carvedilol (COREG) 12.5 MG tablet Take 1 tablet (12.5 mg total) by mouth 2 (two) times daily. 10/22/22   [name] A, FNP  Cyanocobalamin (B-12) 1000  MCG TABS Take by mouth.    [provider]  dapagliflozin propanediol (FARXIGA) 10 MG TABS tablet Take 1 tablet (10 mg total) by mouth daily. 09/19/21   Junie Spencer, FNP  Estradiol 10 MCG TABS vaginal tablet Place 1 tablet (10 mcg total) vaginally 2 (two) times a week. 11/14/22   Bjorn Pippin, MD  fosfomycin (MONUROL) 3 g PACK Take 3gm po every 10 days. 11/14/22   Bjorn Pippin, MD  furosemide (LASIX) 80 MG tablet Take 1 tablet (80 mg total) by mouth daily as needed for fluid. 10/22/22   [name] A, FNP  glucose blood (GLUCOSE METER TEST) test strip Use BID 10/12/21   [name] A, FNP  hydrOXYzine (VISTARIL) 25 MG capsule Take 1 capsule (25 mg total) by mouth every 8 (eight) hours as needed. 08/13/22   Junie Spencer, FNP  levothyroxine (SYNTHROID) 88 MCG tablet TAKE 1 TABLET BY MOUTH ONCE DAILY BEFORE BREAKFAST 10/28/22   [name] A, FNP  metFORMIN (GLUCOPHAGE-XR) 750 MG 24 hr tablet Take 1 tablet (750 mg total) by mouth 2 (two) times daily. 10/22/22   [name] A, FNP  pantoprazole (PROTONIX) 40 MG tablet TAKE 1 TABLET BY MOUTH ONCE DAILY BEFORE BREAKFAST Patient taking differently: Take 40 mg by mouth daily. 07/08/22   [name] A, FNP  polyethylene glycol (MIRALAX) 17 g packet Take 17 g by mouth daily. Patient taking differently: Take 17 g by mouth daily as needed for mild constipation or  moderate constipation. 04/23/22   Burgess Amor, PA-C  spironolactone (ALDACTONE) 25 MG tablet Take 1 tablet (25 mg total) by mouth daily. 10/22/22   Junie Spencer, FNP      Allergies    Buspar [buspirone], Lexapro [escitalopram oxalate], Trulicity [dulaglutide], Bactrim, Cephalexin, Chlorhexidine gluconate, Clindamycin, Contrast media [iodinated contrast media], Crestor [rosuvastatin], Doxycycline, Latex, Levofloxacin, Nitrofurantoin monohyd macro, and Penicillins    Review of Systems   Review of Systems  Physical Exam Updated Vital Signs BP 122/71 (BP Location: Right Arm)    Pulse 70   Temp 98.2 F (36.8 C) (Oral)   Resp 18   Ht 5' 2.5" (1.588 m)   Wt 79.4 kg   SpO2 100%   BMI 31.50 kg/m  Physical Exam Vitals and nursing note reviewed.  Constitutional:      General: She is not in acute distress.    Appearance: Normal appearance. She is well-developed. She is not ill-appearing.  Cardiovascular:     Rate and Rhythm: Normal rate.  Pulmonary:     Effort: Pulmonary effort is normal.  Abdominal:     Tenderness: There is no right CVA tenderness or left CVA tenderness.     Comments: Suprapubic tenderness without guarding.   Musculoskeletal:        General: Normal range of motion.     Cervical back: Normal range of motion.  Skin:    General: Skin is warm and dry.  Neurological:     Mental Status: She is alert and oriented to person, place, and time.     ED Results / Procedures / Treatments   Labs (all labs ordered are listed, but only abnormal results are displayed) Labs Reviewed  URINALYSIS, ROUTINE W REFLEX MICROSCOPIC - Abnormal; Notable for the following components:      Result Value   APPearance CLOUDY (*)    Glucose, UA >=500 (*)    Hgb urine dipstick LARGE (*)    Protein, ur 100 (*)    Leukocytes,Ua MODERATE (*)    Bacteria, UA FEW (*)    All other components within normal limits  CBC WITH DIFFERENTIAL/PLATELET - Abnormal; Notable for the following components:   RBC 3.85 (*)    Hemoglobin 9.2 (*)    HCT 31.6 (*)    MCH 23.9 (*)    MCHC 29.1 (*)    RDW 18.2 (*)    Abs Immature Granulocytes 0.12 (*)    All other components within normal limits  BASIC METABOLIC PANEL - Abnormal; Notable for the following components:   Glucose, Bld 138 (*)    Calcium 8.4 (*)    GFR, Estimated 59 (*)    All other components within normal limits  URINE CULTURE    EKG None  Radiology CT Renal Stone Study  Result Date: 12/10/2022 CLINICAL DATA:  Painful urination, initial encounter EXAM: CT ABDOMEN AND PELVIS WITHOUT CONTRAST TECHNIQUE:  Multidetector CT imaging of the abdomen and pelvis was performed following the standard protocol without IV contrast. RADIATION DOSE REDUCTION: This exam was performed according to the departmental dose-optimization program which includes automated exposure control, adjustment of the mA and/or kV according to patient size and/or use of iterative reconstruction technique. COMPARISON:  04/23/2022 FINDINGS: Lower chest: Stable pleural base nodule is noted in the left lower lobe laterally. No new nodular changes are seen. Hepatobiliary: No focal liver abnormality is seen. Status post cholecystectomy. No biliary dilatation. Pancreas: Unremarkable. No pancreatic ductal dilatation or surrounding inflammatory changes. Spleen: Normal in size without focal abnormality.  Adrenals/Urinary Tract: Adrenal glands are within normal limits. Kidneys are well visualized bilaterally. No renal calculi or obstructive changes are seen. Multiple renal cysts are again identified stable back to 2022. No follow-up exam is recommended. The bladder is partially distended. Considerable amount of air is noted within the bladder wall consistent with emphysematous cystitis. No findings to suggest bladder rupture are noted at this time. Stomach/Bowel: Diverticular change of the colon is noted without evidence of diverticulitis. No obstructive changes of the colon are seen. The appendix is not well visualized consistent with prior surgical history. Small bowel and stomach are unremarkable. Vascular/Lymphatic: Aortic atherosclerosis. No enlarged abdominal or pelvic lymph nodes. Reproductive: Status post hysterectomy. No adnexal masses. Other: No abdominal wall hernia or abnormality. No abdominopelvic ascites. Musculoskeletal: No acute or significant osseous findings. IMPRESSION: Changes consistent with emphysematous cystitis. No bladder rupture is noted at this time. Diverticulosis without diverticulitis. Electronically Signed   By: Alcide Clever M.D.    On: 12/10/2022 19:49    Procedures Procedures    Medications Ordered in ED Medications  gentamicin (GARAMYCIN) 400 mg in dextrose 5 % 50 mL IVPB (400 mg Intravenous New Bag/Given 12/10/22 2126)  HYDROcodone-acetaminophen (NORCO/VICODIN) 5-325 MG per tablet 1 tablet (1 tablet Oral Given 12/10/22 1857)  fosfomycin (MONUROL) packet 3 g (3 g Oral Given 12/10/22 1955)    ED Course/ Medical Decision Making/ A&P Clinical Course as of 12/10/22 2140  Tue Dec 10, 2022  2136 Patient was seen in the ED for possible UTI. After work up was initiated in the triage process, a CT scan Renal study was ordered. She was initially diagnosed with UTI and given an additional dose of her Fosfomycin in the ED, with her next recurring dose to be taken on the 24th. Her CT was then interpreted by radiology and she was found to have emphysematous cystitis. Urology was consulted - Dr. Ronne Binning - who advised that a foley catheter is required. Give IM Gentamicin. She will need to be seen in the office to continue IM Gentamicin and have close following of this infection. The patient was updated. IM Gent dose provided in ED. Foley in place. A urine culture was sent from a specimen from the foley collection bag. Discussed strict return precautions with the patient and included them in her discharge instructions. I have given her a few Norco for pain.  [SU]    Clinical Course User Index [SU] Elpidio Anis, PA-C                                 Medical Decision Making Amount and/or Complexity of Data Reviewed Labs: ordered.  Risk Prescription drug management.           Final Clinical Impression(s) / ED Diagnoses Final diagnoses:  Emphysematous cystitis    Rx / DC Orders ED Discharge Orders          Ordered    HYDROcodone-acetaminophen (NORCO) 5-325 MG tablet  Every 6 hours PRN        12/10/22 1939              Elpidio Anis, PA-C 12/10/22 1931    Elpidio Anis, PA-C 12/10/22 2140     Royanne Foots, DO 12/13/22 [phone]

## 2022-12-10 NOTE — ED Provider Triage Note (Signed)
Emergency Medicine Provider Triage Evaluation Note  Melissa Paul , a 77 y.o. female  was evaluated in triage.  Pt complains of suprapubic pain increased urinary frequency and pain with urination.  Symptoms have been present for some time.  She has been followed by urology for recurrent UTIs with ESBL E. coli on culture in April of this year.  She has been taking fosfomycin every 10 days due to multiple drug allergies.  Was scheduled to see the urologist today for procedure but missed her appointment due to her pain.  Last night began having increased pain in her suprapubic region.  She also notes having some right-sided flank pain as well.  No history of kidney stones.  She denies any fever, chills, nausea or vomiting.  Review of Systems  Positive: Pain and burning with urination, right flank pain Negative: Fever, nausea, vomiting and diarrhea  Physical Exam  BP (!) 108/54 (BP Location: Right Arm)   Pulse 70   Temp 98.4 F (36.9 C) (Oral)   Resp 18   Ht 5' 2.5" (1.588 m)   Wt 79.4 kg   SpO2 97%   BMI 31.50 kg/m  Gen:   Awake, no distress   Resp:  Normal effort  MSK:   Moves extremities without difficulty  Other:    Medical Decision Making  Medically screening exam initiated at 2:42 PM.  Appropriate orders placed.  JAYMEE CELA was informed that the remainder of the evaluation will be completed by another provider, this initial triage assessment does not replace that evaluation, and the importance of remaining in the ED until their evaluation is complete.     Pauline Aus, PA-C 12/10/22 1446

## 2022-12-10 NOTE — ED Notes (Signed)
Assisted pt to bathroom. Pt urinated.

## 2022-12-10 NOTE — ED Notes (Signed)
Pt given urine cup to try and give sample.

## 2022-12-10 NOTE — ED Triage Notes (Signed)
Pt concerned for UTI/bladder infection c/o painful and frequent urination since last night, suprapubic pain

## 2022-12-12 ENCOUNTER — Ambulatory Visit (INDEPENDENT_AMBULATORY_CARE_PROVIDER_SITE_OTHER): Payer: Medicare Other | Admitting: Urology

## 2022-12-12 ENCOUNTER — Ambulatory Visit: Payer: Medicare Other

## 2022-12-12 ENCOUNTER — Encounter: Payer: Self-pay | Admitting: Urology

## 2022-12-12 VITALS — BP 87/54 | HR 84 | Ht 62.5 in | Wt 175.0 lb

## 2022-12-12 DIAGNOSIS — N3946 Mixed incontinence: Secondary | ICD-10-CM | POA: Diagnosis not present

## 2022-12-12 DIAGNOSIS — N3080 Other cystitis without hematuria: Secondary | ICD-10-CM | POA: Diagnosis not present

## 2022-12-12 DIAGNOSIS — N390 Urinary tract infection, site not specified: Secondary | ICD-10-CM

## 2022-12-12 MED ORDER — GENTAMICIN SULFATE 40 MG/ML IJ SOLN
160.0000 mg | INTRAMUSCULAR | Status: AC
  Administered 2022-12-12 – 2022-12-13 (×2): 160 mg via INTRAMUSCULAR

## 2022-12-12 NOTE — Progress Notes (Signed)
IM injection  Medication: Gentamicin Dose: 160mg  Location: left ventrogluteal  Lot: [id number] Exp: 04/2023  Patient tolerated well, no complications were noted  Performed by: Guss Bunde, CMA

## 2022-12-12 NOTE — Progress Notes (Signed)
Subjective: 1. Recurrent UTI   2. 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  She was given Gentamicin and a foley was placed.   She had previously been on Fosfomycin for suppression every 10 days.  Her culture is growing Klebsiella but sensitivities are pending.     11/14/22: Melissa Paul returns today in f/u for her history below.  She is on topical estrogen and has been on fosfomycin q10days for suppression.   She was seen in July and had Klebsiella that was sensitive to all meds tested but ampicillin.   Her Cr was 1.31 on 10/22/22.   KUB on 08/13/22 showed no stones. She has MUI and has prolapse. She has no dysuria but has chronic suprapubic pain. There are no aggravators.  She has improved bowel function.  She feels like she is voiding better but still has some urgency.   The topical estrogen still causes some burning.    Melissa Paul is a 77 yo female who is sent for recurrent UTI's with ESBL e. Coli on a culture in April with Mx antibiotic allergies including sulfa, PCN, keflex, Nitrofurantoin, levaquin and doxycycline.   She had a CT stone study on 04/23/22.  She has stable minimally complex bilateral renal cysts.  She was most recently treated with fosfomycin on 05/04/22 and had Cipro on 04/23/22 but was resistant.  She has bladder pressure and urgency and she has to stand and bend over to start her stream.  Her PVR is only 33ml.  She has prolapse and had a pessary but was unable to retain it.  She has been on topical estrogen but is off of it now.  She has no history of stones. She has had no GU surgery.  She has no pneumoturia or fecaluria.  She has had no hematuria.  She has had pyelonephritis in the past.  She has malodorous urine.  She wears pads for MUI.    ROS:  ROS  Allergies  Allergen Reactions   Buspar [Buspirone] Other (See Comments)    Patient states that she became  hyper and had lots of itching.   Lexapro [Escitalopram Oxalate] Other (See Comments)    Patient states that she became hyper and lots of itching   Trulicity [Dulaglutide] Hives and Itching    Tongue was numb.   Bactrim Rash   Cephalexin Swelling and Rash    Says she is able to take cefuroxime Throat swelling   Chlorhexidine Gluconate Hives and Itching    Hives, itching after using wipes with last procedure   Clindamycin Rash        Contrast Media [Iodinated Contrast Media] Rash   Crestor [Rosuvastatin] Hives and Rash   Doxycycline Rash   Latex Rash        Levofloxacin Rash        Nitrofurantoin Monohyd Macro Rash        Penicillins Swelling and Rash    Has patient had a PCN reaction causing immediate rash, facial/tongue/throat swelling, SOB or lightheadedness with hypotension: Yes Has patient had a PCN reaction causing severe rash involving mucus membranes or skin necrosis: Yes Has patient had a PCN reaction that required hospitalization: No Has patient had a PCN reaction occurring within the last 10 years: No Rash/swelled tongue If all of the above answers are "NO", then may proceed with Cephalosporin use.  Past Medical History:  Diagnosis Date   Anxiety    Chronic systolic heart failure (HCC)    Constipation    Cyst of right kidney 11/12/2017   Glucose intolerance (pre-diabetes)    Hyperlipemia    Hypothyroidism    LBBB (left bundle branch block)    Mitral regurgitation    Mild to Moderate   Nonischemic cardiomyopathy (HCC)    LVEF 35-40% by echo 8/13   Paroxysmal atrial fibrillation (HCC)    Brief episodes by device interrogation   UTI (urinary tract infection)    Recurrent   Ventricular tachycardia (HCC)    ICD therapy for VT    Past Surgical History:  Procedure Laterality Date   APPENDECTOMY     BIV ICD GENERATOR CHANGEOUT N/A 07/11/2016   Procedure: BiV ICD Generator Changeout;  Surgeon: Hillis Range, MD;  Location: [facility] INVASIVE CV LAB;  Service:  Cardiovascular;  Laterality: N/A;   CARDIAC DEFIBRILLATOR PLACEMENT  09/2009   [employer] Jude BiV ICD by Dr. Shanna Cisco class III   CHOLECYSTECTOMY     has one ovary left     KNEE ARTHROSCOPY     Left   LEFT AND RIGHT HEART CATHETERIZATION WITH CORONARY ANGIOGRAM N/A 04/12/2014   Procedure: LEFT AND RIGHT HEART CATHETERIZATION WITH CORONARY ANGIOGRAM;  Surgeon: Marykay Lex, MD;  Location[facility] CATH LAB;  Service: Cardiovascular;  Laterality: N/A;   Left breast biopsy x 2     no maliganancy   PACEMAKER IMPLANT     RIGHT HEART CATH N/A 10/27/2020   Procedure: RIGHT HEART CATH;  Surgeon: Laurey Morale, MD;  Location: [facility] INVASIVE CV LAB;  Service: Cardiovascular;  Laterality: N/A;   RIGHT/LEFT HEART CATH AND CORONARY ANGIOGRAPHY N/A 03/26/2019   Procedure: RIGHT/LEFT HEART CATH AND CORONARY ANGIOGRAPHY;  Surgeon: Laurey Morale, MD;  Location: [facility] INVASIVE CV LAB;  Service: Cardiovascular;  Laterality: N/A;   TOTAL ABDOMINAL HYSTERECTOMY     (R) ovary removed    Social History   Socioeconomic History   Marital status: Widowed    Spouse name: Not on file   Number of children: 2   Years of education: Not on file   Highest education level: Not on file  Occupational History   Not on file  Tobacco Use   Smoking status: Former    Current packs/day: 0.00    Average packs/day: 0.3 packs/day for 46.5 years (13.9 ttl pk-yrs)    Types: Cigarettes    Start date: 08/03/1949    Quit date: 01/22/1996    Years since quitting: 26.9    Passive exposure: Never   Smokeless tobacco: Never  Vaping Use   Vaping status: Never Used  Substance and Sexual Activity   Alcohol use: No    Alcohol/week: 0.0 standard drinks of alcohol   Drug use: No   Sexual activity: Not Currently    Birth control/protection: Surgical    Comment: hyst  Other Topics Concern   Not on file  Social History Narrative   Her daughter, Melissa Paul lives with her - takes her to all visits and cooks for her.   Social Determinants of  Health   Financial Resource Strain: Low Risk  (09/11/2022)   Overall Financial Resource Strain (CARDIA)    Difficulty of Paying Living Expenses: Not hard at all  Food Insecurity: No Food Insecurity (09/11/2022)   Hunger Vital Sign    Worried About Running Out of Food in the Last Year: Never true    Ran Out of  Food in the Last Year: Never true  Transportation Needs: No Transportation Needs (09/11/2022)   PRAPARE - Administrator, Civil Service (Medical): No    Lack of Transportation (Non-Medical): No  Physical Activity: Inactive (09/11/2022)   Exercise Vital Sign    Days of Exercise per Week: 0 days    Minutes of Exercise per Session: 0 min  Stress: No Stress Concern Present (09/11/2022)   [employer] of Occupational Health - Occupational Stress Questionnaire    Feeling of Stress : Not at all  Social Connections: Moderately Isolated (09/11/2022)   Social Connection and Isolation Panel [NHANES]    Frequency of Communication with Friends and Family: More than three times a week    Frequency of Social Gatherings with Friends and Family: More than three times a week    Attends Religious Services: 1 to 4 times per year    Active Member of [employer] or Organizations: No    Attends Banker Meetings: Never    Marital Status: Widowed  Intimate Partner Violence: Not At Risk (09/11/2022)   Humiliation, Afraid, Rape, and Kick questionnaire    Fear of Current or Ex-Partner: No    Emotionally Abused: No    Physically Abused: No    Sexually Abused: No    Family History  Problem Relation Age of Onset   CVA Mother        multiple   Heart Problems Mother        "weak heart"   Heart disease Maternal Grandmother    Heart disease Maternal Grandfather    Cancer Father        Stomach and eshpogus   Diabetes Neg Hx    Hypertension Neg Hx    Coronary artery disease Neg Hx     Anti-infectives: Anti-infectives (From admission, onward)    Start     Dose/Rate Route  Frequency Ordered Stop   12/13/22 0000  cefUROXime (CEFTIN) 250 MG tablet        250 mg Oral 2 times daily with meals 12/13/22 0848     12/12/22 1030  GENTAMICIN SULFATE 40 MG/ML IJ SOLN        160 mg Intramuscular Every 24 hours 12/12/22 1029 12/14/22 1029       Current Outpatient Medications  Medication Sig Dispense Refill   acetaminophen (TYLENOL) 650 MG CR tablet Take 650 mg by mouth every 8 (eight) hours as needed for pain.     albuterol (PROVENTIL) (2.5 MG/3ML) 0.083% nebulizer solution Take 3 mLs (2.5 mg total) by nebulization every 6 (six) hours as needed for wheezing or shortness of breath. 150 mL 1   apixaban (ELIQUIS) 5 MG TABS tablet Take 1 tablet (5 mg total) by mouth 2 (two) times daily. 180 tablet 2   blood glucose meter kit and supplies 1 each by Other route 2 (two) times daily as needed for other. Dispense based on patient and insurance preference. . (FOR ICD-10 E10.9, E11.9).Dispense based on patient and insurance preference. 1 each 0   carvedilol (COREG) 12.5 MG tablet Take 1 tablet (12.5 mg total) by mouth 2 (two) times daily. 180 tablet 1   cefUROXime (CEFTIN) 250 MG tablet Take 1 tablet (250 mg total) by mouth 2 (two) times daily with a meal. Take with 25mg  of benadryl po. 14 tablet 0   Cyanocobalamin (B-12) 1000 MCG TABS Take by mouth.     dapagliflozin propanediol (FARXIGA) 10 MG TABS tablet Take 1 tablet (10 mg total) by mouth  daily. 90 tablet 6   Estradiol 10 MCG TABS vaginal tablet Place 1 tablet (10 mcg total) vaginally 2 (two) times a week. 8 tablet 11   fosfomycin (MONUROL) 3 g PACK Take 3gm po every 10 days. 3 each 11   furosemide (LASIX) 80 MG tablet Take 1 tablet (80 mg total) by mouth daily as needed for fluid. 90 tablet 1   glucose blood (GLUCOSE METER TEST) test strip Use BID 100 each 12   HYDROcodone-acetaminophen (NORCO) 5-325 MG tablet Take 1 tablet by mouth every 6 (six) hours as needed for moderate pain (pain score 4-6). 5 tablet 0   hydrOXYzine  (VISTARIL) 25 MG capsule Take 1 capsule (25 mg total) by mouth every 8 (eight) hours as needed. 60 capsule 2   levothyroxine (SYNTHROID) 88 MCG tablet TAKE 1 TABLET BY MOUTH ONCE DAILY BEFORE BREAKFAST 90 tablet 0   metFORMIN (GLUCOPHAGE-XR) 750 MG 24 hr tablet Take 1 tablet (750 mg total) by mouth 2 (two) times daily. 60 tablet 1   pantoprazole (PROTONIX) 40 MG tablet TAKE 1 TABLET BY MOUTH ONCE DAILY BEFORE BREAKFAST (Patient taking differently: Take 40 mg by mouth daily.) 90 tablet 1   polyethylene glycol (MIRALAX) 17 g packet Take 17 g by mouth daily. (Patient taking differently: Take 17 g by mouth daily as needed for mild constipation or moderate constipation.) 14 each 0   spironolactone (ALDACTONE) 25 MG tablet Take 1 tablet (25 mg total) by mouth daily. 90 tablet 1   Current Facility-Administered Medications  Medication Dose Route Frequency Provider Last Rate Last Admin   gentamicin (GARAMYCIN) injection 160 mg  160 mg Intramuscular Q24H    160 mg at 12/12/22 1030     Objective: Vital signs in last 24 hours: BP (!) 87/54   Pulse 84   Ht 5' 2.5" (1.588 m)   Wt 175 lb (79.4 kg)   BMI 31.50 kg/m   Intake/Output from previous day: No intake/output data recorded. Intake/Output this shift: @IOTHISSHIFT @   Physical Exam Vitals reviewed.  Constitutional:      Appearance: Normal appearance.  Abdominal:     Palpations: Abdomen is soft.     Tenderness: There is abdominal tenderness (mild sp tenderness.).  Neurological:     Mental Status: She is alert.     Lab Results:      BMET Recent Labs    12/10/22 1540  NA 140  K 3.5  CL 100  CO2 30  GLUCOSE 138*  BUN 13  CREATININE 0.97  CALCIUM 8.4*   Results for orders placed or performed during the hospital encounter of 12/10/22 (from the past 72 hour(s))  Urinalysis, Routine w reflex microscopic -Urine, Clean Catch     Status: Abnormal   Collection Time: 12/10/22  1:36 PM  Result Value Ref Range   Color, Urine YELLOW  YELLOW   APPearance CLOUDY (A) CLEAR   Specific Gravity, Urine 1.018 1.005 - 1.030   pH 6.0 5.0 - 8.0   Glucose, UA >=500 (A) NEGATIVE mg/dL   Hgb urine dipstick LARGE (A) NEGATIVE   Bilirubin Urine NEGATIVE NEGATIVE   Ketones, ur NEGATIVE NEGATIVE mg/dL   Protein, ur 161 (A) NEGATIVE mg/dL   Nitrite NEGATIVE NEGATIVE   Leukocytes,Ua MODERATE (A) NEGATIVE   RBC / HPF 21-50 0 - 5 RBC/hpf   WBC, UA >50 0 - 5 WBC/hpf   Bacteria, UA FEW (A) NONE SEEN   Squamous Epithelial / HPF 6-10 0 - 5 /HPF   Mucus PRESENT  Comment: Performed at [facility], [address], [place], [place] [address]  Urine Culture     Status: Abnormal   Collection Time: 12/10/22  1:36 PM   Specimen: Urine, Clean Catch  Result Value Ref Range   Specimen Description      URINE, CLEAN CATCH Performed at [facility], [address] [place], [place] [address]    Special Requests      NONE Performed at [facility], [address] [place], [place] [address]    Culture >=100,000 COLONIES/mL KLEBSIELLA PNEUMONIAE (A)    Report Status 12/13/2022 FINAL    Organism ID, Bacteria KLEBSIELLA PNEUMONIAE (A)       Susceptibility   Klebsiella pneumoniae - MIC*    AMPICILLIN >=32 RESISTANT Resistant     CEFAZOLIN <=4 SENSITIVE Sensitive     CEFEPIME <=0.12 SENSITIVE Sensitive     CEFTRIAXONE <=0.25 SENSITIVE Sensitive     CIPROFLOXACIN <=0.25 SENSITIVE Sensitive     GENTAMICIN <=1 SENSITIVE Sensitive     IMIPENEM 0.5 SENSITIVE Sensitive     NITROFURANTOIN <=16 SENSITIVE Sensitive     TRIMETH/SULFA <=20 SENSITIVE Sensitive     AMPICILLIN/SULBACTAM <=2 SENSITIVE Sensitive     PIP/TAZO <=4 SENSITIVE Sensitive ug/mL    * >=100,000 COLONIES/mL KLEBSIELLA PNEUMONIAE  CBC with Differential     Status: Abnormal   Collection Time: 12/10/22  3:40 PM  Result Value Ref Range   WBC 7.0 4.0 - 10.5 K/uL   RBC 3.85 (L) 3.87 - 5.11 MIL/uL   Hemoglobin 9.2 (L) 12.0 - 15.0 g/dL   HCT 07.3 (L) 71.0 - 62.6 %   MCV 82.1 80.0 - 100.0  fL   MCH 23.9 (L) 26.0 - 34.0 pg   MCHC 29.1 (L) 30.0 - 36.0 g/dL   RDW 94.8 (H) 54.6 - 27.0 %   Platelets 231 150 - 400 K/uL   nRBC 0.0 0.0 - 0.2 %   Neutrophils Relative % 53 %   Neutro Abs 3.7 1.7 - 7.7 K/uL   Lymphocytes Relative 33 %   Lymphs Abs 2.3 0.7 - 4.0 K/uL   Monocytes Relative 11 %   Monocytes Absolute 0.8 0.1 - 1.0 K/uL   Eosinophils Relative 1 %   Eosinophils Absolute 0.1 0.0 - 0.5 K/uL   Basophils Relative 0 %   Basophils Absolute 0.0 0.0 - 0.1 K/uL   Immature Granulocytes 2 %   Abs Immature Granulocytes 0.12 (H) 0.00 - 0.07 K/uL    Comment: Performed at [facility], [address], [place], [place] [address]  Basic metabolic panel     Status: Abnormal   Collection Time: 12/10/22  3:40 PM  Result Value Ref Range   Sodium 140 135 - 145 mmol/L   Potassium 3.5 3.5 - 5.1 mmol/L   Chloride 100 98 - 111 mmol/L   CO2 30 22 - 32 mmol/L   Glucose, Bld 138 (H) 70 - 99 mg/dL    Comment: Glucose reference range applies only to samples taken after fasting for at least 8 hours.   BUN 13 8 - 23 mg/dL   Creatinine, Ser 3.81 0.44 - 1.00 mg/dL   Calcium 8.4 (L) 8.9 - 10.3 mg/dL   GFR, Estimated 59 (L) >60 mL/min    Comment: (NOTE) Calculated using the CKD-EPI Creatinine Equation (2021)    Anion gap 10 5 - 15    Comment: Performed at [facility], [address], [place], [place] [address]   *Note: Due to a large number of  results and/or encounters for the requested time period, some results have not been displayed. A complete set of results can be found in Results Review.    PT/INR No results for input(s): "LABPROT", "INR" in the last 72 hours. ABG No results for input(s): "PHART", "HCO3" in the last 72 hours.  Invalid input(s): "PCO2", "PO2"     Assessment/Plan: Recurrent UTI now with Klebsiella emphysematous cystitis.   Her sensitivities are pending.   I will give her Gentamicin 160mg  today and tomorrow and will see if we can give her an oral agent once we have the  sensitivities.  The klebsiella is sensitive to cephalosporins so I will send Cefuroxime and recommend she takes benadryl.    Vaginal atrophy with moderate prolapse.  She is on vagifem.   MUI.  This is getting worse.   She has had to reschedule the  urodynamics to 12/23/22.  I think she will need to put that off again until she is fully recovered.    Meds ordered this encounter  Medications   gentamicin (GARAMYCIN) injection 160 mg   cefUROXime (CEFTIN) 250 MG tablet    Sig: Take 1 tablet (250 mg total) by mouth 2 (two) times daily with a meal. Take with 25mg  of benadryl po.    Dispense:  14 tablet    Refill:  0     No orders of the defined types were placed in this encounter.    Return for Return tomorrow for Gentamycin 160mg  and then Monday possible Gentamycin 160mg  and foley removal..    CC: [name] NP      Bjorn Pippin 12/13/2022

## 2022-12-13 ENCOUNTER — Telehealth: Payer: Self-pay

## 2022-12-13 ENCOUNTER — Ambulatory Visit (INDEPENDENT_AMBULATORY_CARE_PROVIDER_SITE_OTHER): Payer: Medicare Other

## 2022-12-13 DIAGNOSIS — N390 Urinary tract infection, site not specified: Secondary | ICD-10-CM

## 2022-12-13 DIAGNOSIS — N3080 Other cystitis without hematuria: Secondary | ICD-10-CM

## 2022-12-13 LAB — URINE CULTURE: Culture: >=100000 — AB

## 2022-12-13 MED ORDER — CEFUROXIME AXETIL 250 MG PO TABS: 250.0000 mg | ORAL_TABLET | Freq: Two times a day (BID) | ORAL | 0 refills | Status: DC

## 2022-12-13 NOTE — Telephone Encounter (Signed)
-----   Message from Bjorn Pippin sent at 12/13/2022  8:51 AM EST ----- I have sent cefuroxime for her to begin after her Gentamicin injection today.   She will not need any further gentamicin but can have her foley out on Monday.

## 2022-12-13 NOTE — Progress Notes (Signed)
Gentamicin injection  Medication: Gentamicin Dose: 160mg  Location: right ventrogluteal Lot: [id number] Exp: 04/2023  Patient tolerated well, no complications were noted  Performed by: Guss Bunde, CMA

## 2022-12-13 NOTE — Telephone Encounter (Signed)
Patient aware of MD response to abx and voiced understanding.

## 2022-12-14 ENCOUNTER — Telehealth (HOSPITAL_BASED_OUTPATIENT_CLINIC_OR_DEPARTMENT_OTHER): Payer: Self-pay | Admitting: *Deleted

## 2022-12-14 NOTE — Telephone Encounter (Signed)
Post ED Visit - Positive Culture Follow-up  Culture report reviewed by antimicrobial stewardship pharmacist: [facility] []  Enzo Bi, Pharm.D. []  Celedonio Miyamoto, Pharm.D., BCPS AQ-ID []  Garvin Fila, Pharm.D., BCPS []  Georgina Pillion, Pharm.D., BCPS []  [place], [address].D., BCPS, AAHIVP []  Estella Husk, Pharm.D., BCPS, AAHIVP []  Lysle Pearl, PharmD, BCPS []  Phillips Climes, PharmD, BCPS []  Agapito Games, PharmD, BCPS []  Verlan Friends, PharmD []  Mervyn Gay, PharmD, BCPS [x]  Delmar Landau, PharmD  [facility] []  Len Childs, PharmD []  Greer Pickerel, PharmD []  Adalberto Cole, PharmD []  Perlie Gold, Rph []  Lonell Face) Jean Rosenthal, PharmD []  Earl Many, PharmD []  Junita Push, PharmD []  Dorna Leitz, PharmD []  Terrilee Files, PharmD []  Lynann Beaver, PharmD []  Keturah Barre, PharmD []  Loralee Pacas, PharmD []  Bernadene Person, PharmD   Positive urine culture No further patient follow-up is required at this time.  Patsey Berthold 12/14/2022, 12:34 PM

## 2022-12-16 ENCOUNTER — Ambulatory Visit (INDEPENDENT_AMBULATORY_CARE_PROVIDER_SITE_OTHER): Payer: Medicare Other

## 2022-12-16 ENCOUNTER — Other Ambulatory Visit: Payer: Medicare Other

## 2022-12-16 ENCOUNTER — Inpatient Hospital Stay: Payer: Medicare Other

## 2022-12-16 DIAGNOSIS — N3946 Mixed incontinence: Secondary | ICD-10-CM

## 2022-12-16 DIAGNOSIS — D509 Iron deficiency anemia, unspecified: Secondary | ICD-10-CM

## 2022-12-16 LAB — CBC WITH DIFFERENTIAL/PLATELET
Abs Immature Granulocytes: 0.08 10*3/uL — ABNORMAL HIGH (ref 0.00–0.07)
Basophils Absolute: 0 10*3/uL (ref 0.0–0.1)
Basophils Relative: 0 %
Eosinophils Absolute: 0 10*3/uL (ref 0.0–0.5)
Eosinophils Relative: 0 %
HCT: 33.7 % — ABNORMAL LOW (ref 36.0–46.0)
Hemoglobin: 10.5 g/dL — ABNORMAL LOW (ref 12.0–15.0)
Immature Granulocytes: 1 %
Lymphocytes Relative: 33 %
Lymphs Abs: 2.1 10*3/uL (ref 0.7–4.0)
MCH: 25.9 pg — ABNORMAL LOW (ref 26.0–34.0)
MCHC: 31.2 g/dL (ref 30.0–36.0)
MCV: 83 fL (ref 80.0–100.0)
Monocytes Absolute: 0.7 10*3/uL (ref 0.1–1.0)
Monocytes Relative: 10 %
Neutro Abs: 3.6 10*3/uL (ref 1.7–7.7)
Neutrophils Relative %: 56 %
Platelets: 218 10*3/uL (ref 150–400)
RBC: 4.06 MIL/uL (ref 3.87–5.11)
RDW: 21.7 % — ABNORMAL HIGH (ref 11.5–15.5)
WBC: 6.5 10*3/uL (ref 4.0–10.5)
nRBC: 0 % (ref 0.0–0.2)

## 2022-12-16 LAB — IRON AND TIBC
Iron: 96 ug/dL (ref 28–170)
Saturation Ratios: 24 % (ref 10.4–31.8)
TIBC: 405 ug/dL (ref 250–450)
UIBC: 309 ug/dL

## 2022-12-16 LAB — FERRITIN: Ferritin: 149 ng/mL (ref 11–307)

## 2022-12-16 NOTE — Progress Notes (Signed)
Catheter Removal  Patient is present today for a catheter removal.  10ml of water was drained from the balloon. A 14FR foley cath was removed from the bladder, no complications were noted. Patient tolerated well.  Performed by: Gwendolyn Grant CMA & Kourtney CMA  Follow up/ Additional notes: Follow up as scheduled with MD

## 2022-12-21 NOTE — Progress Notes (Unsigned)
[facility] [address] [address]   CLINIC:  Medical Oncology/Hematology  PCP:  Junie Spencer, FNP [address] 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  She was seen by Dr. Ellin Saba for initial visit on 11/05/2022.  At today's visit, she reports feeling ***.  No recent hospitalizations, surgeries, or changes in baseline health status. *** Symptoms after IV iron and B12 injection? *** Bleeding *** Fatigue, pica *** Headaches, lightheadedness, syncope *** Chest pain, dyspnea on exertion  *** Vitamin B12 1000 mcg daily tablet  She has ***% energy and ***% appetite. She endorses that she is maintaining a stable weight.   ASSESSMENT & PLAN:  1.  Anemia secondary to iron deficiency + B12 deficiency - Patient seen at the request of Kristopher Glee, FNP. - Reports history of blood transfusion 20+ years ago - Unable to tolerate oral iron due to nausea/vomiting - Last EGD was 20+ years ago *** - Initial hematology labs (October 2024): Hgb 9.3/MCV 77, ferritin 3, iron saturation 5%.  Vitamin B12 low at 133/elevated MMA 1611.  Normal folate and copper.  She has CKD stage IIIb. - Received IV Monoferric x 1 g on 12/06/2022 (given with PREMEDICATION due to multiple medication allergies)*** - Received vitamin B12 1000 mcg x 1 on 12/06/2022, and started taking vitamin B12 1000 mcg daily tablet *** - Reports occasional hemorrhoid bleeding.  Denies melena.*** - Symptomatic with fatigue and ice pica.  *** - Most recent labs (12/16/2022): Hgb 10.5/MCV 83.0, ferritin 149, iron saturation 24% - PLAN: Hgb  improving following IV iron infusion 2 weeks ago.  Recheck labs (CBC/D, ferritin, iron/TIBC, B12, MMA, BMP) in 6 weeks with *** PHONE *** visit after. - Continue taking daily B12 1000 mcg tablet  2.  Social/Family History: - Lives at home with daughter. Independent of ADL's and dependent of IADL's. Quit tobacco use 25 years ago.  - No family history of anemia. Father had stomach cancer.   PLAN SUMMARY: >> *** >> *** >> ***    REVIEW OF SYSTEMS: ***  Review of Systems - Oncology   PHYSICAL EXAM:  ECOG PERFORMANCE STATUS: {CHL ONC ECOG GN:[id number]} *** There were no vitals filed for this visit. There were no vitals filed for this visit. Physical Exam  PAST MEDICAL/SURGICAL HISTORY:  Past Medical History:  Diagnosis Date   Anxiety    Chronic systolic heart failure (HCC)    Constipation    Cyst of right kidney 11/12/2017   Glucose intolerance (pre-diabetes)    Hyperlipemia    Hypothyroidism    LBBB (left bundle branch block)    Mitral regurgitation    Mild to Moderate   Nonischemic cardiomyopathy (HCC)    LVEF 35-40% by echo 8/13   Paroxysmal atrial fibrillation (HCC)    Brief episodes by device interrogation   UTI (urinary tract infection)    Recurrent   Ventricular tachycardia (HCC)    ICD therapy for VT   Past Surgical History:  Procedure Laterality Date   APPENDECTOMY     BIV ICD GENERATOR CHANGEOUT N/A 07/11/2016   Procedure: BiV ICD Generator Changeout;  Surgeon: Hillis Range, MD;  Location: [facility] INVASIVE CV LAB;  Service: Cardiovascular;  Laterality: N/A;   CARDIAC DEFIBRILLATOR PLACEMENT  09/2009   St. Jude BiV ICD by Dr. Shanna Cisco class III   CHOLECYSTECTOMY     has one ovary left     KNEE ARTHROSCOPY     Left   LEFT AND RIGHT HEART CATHETERIZATION WITH CORONARY ANGIOGRAM N/A 04/12/2014   Procedure: LEFT AND RIGHT HEART CATHETERIZATION WITH CORONARY ANGIOGRAM;  Surgeon: Marykay Lex, MD;  Location: [facility] CATH LAB;  Service: Cardiovascular;  Laterality:  N/A;   Left breast biopsy x 2     no maliganancy   PACEMAKER IMPLANT     RIGHT HEART CATH N/A 10/27/2020   Procedure: RIGHT HEART CATH;  Surgeon: Laurey Morale, MD;  Location: [facility] INVASIVE CV LAB;  Service: Cardiovascular;  Laterality: N/A;   RIGHT/LEFT HEART CATH AND CORONARY ANGIOGRAPHY N/A 03/26/2019   Procedure: RIGHT/LEFT HEART CATH AND CORONARY ANGIOGRAPHY;  Surgeon: Laurey Morale, MD;  Location: [facility] INVASIVE CV LAB;  Service: Cardiovascular;  Laterality: N/A;   TOTAL ABDOMINAL HYSTERECTOMY     (R) ovary removed    SOCIAL HISTORY:  Social History   Socioeconomic History   Marital status: Widowed    Spouse name: Not on file   Number of children: 2   Years of education: Not on file   Highest education level: Not on file  Occupational History   Not on file  Tobacco Use   Smoking status: Former    Current packs/day: 0.00    Average packs/day: 0.3 packs/day for 46.5 years (13.9 ttl pk-yrs)    Types: Cigarettes    Start date: 08/03/1949    Quit date: 01/22/1996    Years since quitting: 26.9    Passive exposure: Never   Smokeless tobacco: Never  Vaping Use   Vaping status: Never Used  Substance and Sexual Activity   Alcohol use: No    Alcohol/week: 0.0 standard drinks of alcohol   Drug use: No   Sexual activity: Not Currently    Birth control/protection: Surgical    Comment: hyst  Other Topics Concern   Not on file  Social History Narrative   Her daughter, [name] lives with her - takes her to all visits and cooks for her.   Social Determinants of Health   Financial Resource Strain: Low Risk  (09/11/2022)   Overall Financial Resource Strain (CARDIA)    Difficulty of Paying Living Expenses: Not hard at all  Food Insecurity: No Food Insecurity (09/11/2022)   Hunger Vital Sign    Worried About Running Out of Food in the Last Year: Never true    Ran Out of Food in the Last Year: Never true  Transportation Needs: No Transportation Needs (09/11/2022)   PRAPARE -  Administrator, Civil Service (Medical): No    Lack of Transportation (Non-Medical): No  Physical Activity: Inactive (09/11/2022)   Exercise Vital Sign    Days of Exercise per Week: 0 days    Minutes of Exercise per Session: 0 min  Stress: No Stress Concern Present (09/11/2022)   [employer] of Occupational Health - Occupational Stress Questionnaire    Feeling of Stress : Not at all  Social Connections: Moderately Isolated (09/11/2022)   Social Connection and Isolation Panel [NHANES]    Frequency of Communication with Friends and Family: More than three times a week    Frequency of Social Gatherings with Friends and Family: More than three times a week  Attends Religious Services: 1 to 4 times per year    Active Member of Clubs or Organizations: No    Attends Banker Meetings: Never    Marital Status: Widowed  Intimate Partner Violence: Not At Risk (09/11/2022)   Humiliation, Afraid, Rape, and Kick questionnaire    Fear of Current or Ex-Partner: No    Emotionally Abused: No    Physically Abused: No    Sexually Abused: No    FAMILY HISTORY:  Family History  Problem Relation Age of Onset   CVA Mother        multiple   Heart Problems Mother        "weak heart"   Heart disease Maternal Grandmother    Heart disease Maternal Grandfather    Cancer Father        Stomach and eshpogus   Diabetes Neg Hx    Hypertension Neg Hx    Coronary artery disease Neg Hx     CURRENT MEDICATIONS:  Outpatient Encounter Medications as of 12/23/2022  Medication Sig   acetaminophen (TYLENOL) 650 MG CR tablet Take 650 mg by mouth every 8 (eight) hours as needed for pain.   albuterol (PROVENTIL) (2.5 MG/3ML) 0.083% nebulizer solution Take 3 mLs (2.5 mg total) by nebulization every 6 (six) hours as needed for wheezing or shortness of breath.   apixaban (ELIQUIS) 5 MG TABS tablet Take 1 tablet (5 mg total) by mouth 2 (two) times daily.   blood glucose meter kit and supplies  1 each by Other route 2 (two) times daily as needed for other. Dispense based on patient and insurance preference. . (FOR ICD-10 E10.9, E11.9).Dispense based on patient and insurance preference.   carvedilol (COREG) 12.5 MG tablet Take 1 tablet (12.5 mg total) by mouth 2 (two) times daily.   cefUROXime (CEFTIN) 250 MG tablet Take 1 tablet (250 mg total) by mouth 2 (two) times daily with a meal. Take with 25mg  of benadryl po.   Cyanocobalamin (B-12) 1000 MCG TABS Take by mouth.   dapagliflozin propanediol (FARXIGA) 10 MG TABS tablet Take 1 tablet (10 mg total) by mouth daily.   Estradiol 10 MCG TABS vaginal tablet Place 1 tablet (10 mcg total) vaginally 2 (two) times a week.   fosfomycin (MONUROL) 3 g PACK Take 3gm po every 10 days.   furosemide (LASIX) 80 MG tablet Take 1 tablet (80 mg total) by mouth daily as needed for fluid.   glucose blood (GLUCOSE METER TEST) test strip Use BID   HYDROcodone-acetaminophen (NORCO) 5-325 MG tablet Take 1 tablet by mouth every 6 (six) hours as needed for moderate pain (pain score 4-6).   hydrOXYzine (VISTARIL) 25 MG capsule Take 1 capsule (25 mg total) by mouth every 8 (eight) hours as needed.   levothyroxine (SYNTHROID) 88 MCG tablet TAKE 1 TABLET BY MOUTH ONCE DAILY BEFORE BREAKFAST   metFORMIN (GLUCOPHAGE-XR) 750 MG 24 hr tablet Take 1 tablet (750 mg total) by mouth 2 (two) times daily.   pantoprazole (PROTONIX) 40 MG tablet TAKE 1 TABLET BY MOUTH ONCE DAILY BEFORE BREAKFAST (Patient taking differently: Take 40 mg by mouth daily.)   polyethylene glycol (MIRALAX) 17 g packet Take 17 g by mouth daily. (Patient taking differently: Take 17 g by mouth daily as needed for mild constipation or moderate constipation.)   spironolactone (ALDACTONE) 25 MG tablet Take 1 tablet (25 mg total) by mouth daily.   No facility-administered encounter medications on file as of 12/23/2022.    ALLERGIES:  Allergies  Allergen Reactions   Buspar [Buspirone] Other (See Comments)     Patient states that she became hyper and had lots of itching.   Lexapro [Escitalopram Oxalate] Other (See Comments)    Patient states that she became hyper and lots of itching   Trulicity [Dulaglutide] Hives and Itching    Tongue was numb.   Bactrim Rash   Cephalexin Swelling and Rash    Says she is able to take cefuroxime Throat swelling   Chlorhexidine Gluconate Hives and Itching    Hives, itching after using wipes with last procedure   Clindamycin Rash        Contrast Media [Iodinated Contrast Media] Rash   Crestor [Rosuvastatin] Hives and Rash   Doxycycline Rash   Latex Rash        Levofloxacin Rash        Nitrofurantoin Monohyd Macro Rash        Penicillins Swelling and Rash    Has patient had a PCN reaction causing immediate rash, facial/tongue/throat swelling, SOB or lightheadedness with hypotension: Yes Has patient had a PCN reaction causing severe rash involving mucus membranes or skin necrosis: Yes Has patient had a PCN reaction that required hospitalization: No Has patient had a PCN reaction occurring within the last 10 years: No Rash/swelled tongue If all of the above answers are "NO", then may proceed with Cephalosporin use.     LABORATORY DATA:  I have reviewed the labs as listed.  CBC    Component Value Date/Time   WBC 6.5 12/16/2022 0945   RBC 4.06 12/16/2022 0945   HGB 10.5 (L) 12/16/2022 0945   HGB 9.3 (L) 10/22/2022 1616   HCT 33.7 (L) 12/16/2022 0945   HCT 30.0 (L) 10/22/2022 1616   PLT 218 12/16/2022 0945   PLT 216 10/22/2022 1616   MCV 83.0 12/16/2022 0945   MCV 77 (L) 10/22/2022 1616   MCH 25.9 (L) 12/16/2022 0945   MCHC 31.2 12/16/2022 0945   RDW 21.7 (H) 12/16/2022 0945   RDW 16.8 (H) 10/22/2022 1616   LYMPHSABS 2.1 12/16/2022 0945   LYMPHSABS 2.7 10/22/2022 1616   MONOABS 0.7 12/16/2022 0945   EOSABS 0.0 12/16/2022 0945   EOSABS 0.0 10/22/2022 1616   BASOSABS 0.0 12/16/2022 0945   BASOSABS 0.0 10/22/2022 1616      Latest Ref  Rng & Units 12/10/2022    3:40 PM 10/22/2022    4:16 PM 08/13/2022    3:24 PM  CMP  Glucose 70 - 99 mg/dL 782  98  956   BUN 8 - 23 mg/dL 13  25  30    Creatinine 0.44 - 1.00 mg/dL 2.13  0.86  5.78   Sodium 135 - 145 mmol/L 140  139  138   Potassium 3.5 - 5.1 mmol/L 3.5  4.6  3.8   Chloride 98 - 111 mmol/L 100  98  95   CO2 22 - 32 mmol/L 30  24  25    Calcium 8.9 - 10.3 mg/dL 8.4  9.1  9.5   Total Protein 6.0 - 8.5 g/dL  6.9    Total Bilirubin 0.0 - 1.2 mg/dL  0.4    Alkaline Phos 44 - 121 IU/L  73    AST 0 - 40 IU/L  11    ALT 0 - 32 IU/L  5      DIAGNOSTIC IMAGING:  I have independently reviewed the relevant imaging and discussed with the patient.   WRAP UP:  All questions were answered. The  patient knows to call the clinic with any problems, questions or concerns.  Medical decision making: ***  Time spent on visit: I spent *** minutes counseling the patient face to face. The total time spent in the appointment was *** minutes and more than 50% was on counseling.  Carnella Guadalajara, PA-C  ***

## 2022-12-23 ENCOUNTER — Ambulatory Visit: Payer: Medicare Other | Attending: Cardiovascular Disease

## 2022-12-23 ENCOUNTER — Inpatient Hospital Stay: Payer: Medicare Other | Attending: Physician Assistant | Admitting: Physician Assistant

## 2022-12-23 VITALS — BP 101/52 | HR 87 | Temp 97.2°F | Resp 20 | Wt 176.2 lb

## 2022-12-23 DIAGNOSIS — D509 Iron deficiency anemia, unspecified: Secondary | ICD-10-CM | POA: Diagnosis not present

## 2022-12-23 DIAGNOSIS — R109 Unspecified abdominal pain: Secondary | ICD-10-CM | POA: Diagnosis not present

## 2022-12-23 DIAGNOSIS — R0602 Shortness of breath: Secondary | ICD-10-CM | POA: Insufficient documentation

## 2022-12-23 DIAGNOSIS — Z87891 Personal history of nicotine dependence: Secondary | ICD-10-CM | POA: Diagnosis not present

## 2022-12-23 DIAGNOSIS — E538 Deficiency of other specified B group vitamins: Secondary | ICD-10-CM | POA: Diagnosis not present

## 2022-12-23 DIAGNOSIS — D649 Anemia, unspecified: Secondary | ICD-10-CM | POA: Diagnosis not present

## 2022-12-23 DIAGNOSIS — Z87440 Personal history of urinary (tract) infections: Secondary | ICD-10-CM | POA: Insufficient documentation

## 2022-12-23 DIAGNOSIS — E039 Hypothyroidism, unspecified: Secondary | ICD-10-CM | POA: Insufficient documentation

## 2022-12-23 DIAGNOSIS — Z800 Family history of malignant neoplasm of digestive organs: Secondary | ICD-10-CM | POA: Insufficient documentation

## 2022-12-23 DIAGNOSIS — Z79890 Hormone replacement therapy (postmenopausal): Secondary | ICD-10-CM | POA: Insufficient documentation

## 2022-12-23 DIAGNOSIS — R0609 Other forms of dyspnea: Secondary | ICD-10-CM | POA: Diagnosis not present

## 2022-12-23 DIAGNOSIS — Z881 Allergy status to other antibiotic agents status: Secondary | ICD-10-CM | POA: Insufficient documentation

## 2022-12-23 DIAGNOSIS — R079 Chest pain, unspecified: Secondary | ICD-10-CM | POA: Insufficient documentation

## 2022-12-23 DIAGNOSIS — I480 Paroxysmal atrial fibrillation: Secondary | ICD-10-CM | POA: Diagnosis not present

## 2022-12-23 DIAGNOSIS — R5383 Other fatigue: Secondary | ICD-10-CM | POA: Diagnosis not present

## 2022-12-23 DIAGNOSIS — R519 Headache, unspecified: Secondary | ICD-10-CM | POA: Insufficient documentation

## 2022-12-23 DIAGNOSIS — R059 Cough, unspecified: Secondary | ICD-10-CM | POA: Insufficient documentation

## 2022-12-23 DIAGNOSIS — Z880 Allergy status to penicillin: Secondary | ICD-10-CM | POA: Insufficient documentation

## 2022-12-23 DIAGNOSIS — I5022 Chronic systolic (congestive) heart failure: Secondary | ICD-10-CM

## 2022-12-23 DIAGNOSIS — Z90721 Acquired absence of ovaries, unilateral: Secondary | ICD-10-CM | POA: Insufficient documentation

## 2022-12-23 DIAGNOSIS — E1122 Type 2 diabetes mellitus with diabetic chronic kidney disease: Secondary | ICD-10-CM | POA: Diagnosis not present

## 2022-12-23 DIAGNOSIS — Z7901 Long term (current) use of anticoagulants: Secondary | ICD-10-CM | POA: Insufficient documentation

## 2022-12-23 DIAGNOSIS — N1832 Chronic kidney disease, stage 3b: Secondary | ICD-10-CM | POA: Diagnosis not present

## 2022-12-23 DIAGNOSIS — Z95810 Presence of automatic (implantable) cardiac defibrillator: Secondary | ICD-10-CM

## 2022-12-23 DIAGNOSIS — Z90710 Acquired absence of both cervix and uterus: Secondary | ICD-10-CM | POA: Insufficient documentation

## 2022-12-23 DIAGNOSIS — G479 Sleep disorder, unspecified: Secondary | ICD-10-CM | POA: Diagnosis not present

## 2022-12-23 DIAGNOSIS — Z79899 Other long term (current) drug therapy: Secondary | ICD-10-CM | POA: Insufficient documentation

## 2022-12-23 DIAGNOSIS — Z888 Allergy status to other drugs, medicaments and biological substances status: Secondary | ICD-10-CM | POA: Insufficient documentation

## 2022-12-23 DIAGNOSIS — K5900 Constipation, unspecified: Secondary | ICD-10-CM | POA: Diagnosis not present

## 2022-12-23 DIAGNOSIS — Z8249 Family history of ischemic heart disease and other diseases of the circulatory system: Secondary | ICD-10-CM | POA: Insufficient documentation

## 2022-12-23 DIAGNOSIS — Z9049 Acquired absence of other specified parts of digestive tract: Secondary | ICD-10-CM | POA: Insufficient documentation

## 2022-12-23 DIAGNOSIS — F419 Anxiety disorder, unspecified: Secondary | ICD-10-CM | POA: Diagnosis not present

## 2022-12-23 DIAGNOSIS — R112 Nausea with vomiting, unspecified: Secondary | ICD-10-CM | POA: Diagnosis not present

## 2022-12-23 DIAGNOSIS — Z823 Family history of stroke: Secondary | ICD-10-CM | POA: Insufficient documentation

## 2022-12-23 NOTE — Patient Instructions (Signed)
[facility] at [facility] **VISIT SUMMARY & IMPORTANT INSTRUCTIONS **   You were seen today by Rojelio Brenner PA-C for your iron deficiency anemia.   Your blood and iron levels are improving, but still low. We will recheck your labs in 6 weeks and discuss with a phone visit the following week. Keep taking your B12 supplement daily.  ** Thank you for trusting me with your healthcare!  I strive to provide all of my patients with quality care at each visit.  If you receive a survey for this visit, I would be so grateful to you for taking the time to provide feedback.  Thank you in advance!  ~ Akeylah Hendel                   Dr. Doreatha Massed   &   Rojelio Brenner, PA-C   - - - - - - - - - - - - - - - - - -    Thank you for choosing [facility] at [facility] to provide your oncology and hematology care.  To afford each patient quality time with our provider, please arrive at least 15 minutes before your scheduled appointment time.   If you have a lab appointment with the Cancer Center please come in thru the Main Entrance and check in at the main information desk.  You need to re-schedule your appointment should you arrive 10 or more minutes late.  We strive to give you quality time with our providers, and arriving late affects you and other patients whose appointments are after yours.  Also, if you no show three or more times for appointments you may be dismissed from the clinic at the providers discretion.     Again, thank you for choosing [facility].  Our hope is that these requests will decrease the amount of time that you wait before being seen by our physicians.       _____________________________________________________________  Should you have questions after your visit to [facility], please contact our office at [phone] and follow the prompts.  Our office hours are 8:00 a.m. and 4:30 p.m. Monday -  Friday.  Please note that voicemails left after 4:00 p.m. may not be returned until the following business day.  We are closed weekends and major holidays.  You do have access to a nurse 24-7, just call the main number to the clinic [phone] and do not press any options, hold on the line and a nurse will answer the phone.    For prescription refill requests, have your pharmacy contact our office and allow 72 hours.

## 2022-12-25 ENCOUNTER — Telehealth: Payer: Self-pay

## 2022-12-25 NOTE — Progress Notes (Signed)
EPIC Encounter for ICM Monitoring  Patient Name: Melissa Paul is a 77 y.o. female Date: 12/25/2022 Primary Care Physican: Junie Spencer, FNP Primary Cardiologist: McDowell/McLean Electrophysiologist: Mealor Bi-V Pacing: 90%        01/01/2022 Weight: 178 lbs 03/15/2022 Weight: 170 lbs 05/24/2022 Weight:  162 lbs 06/25/2022  Weight:  165 lbs 07/16/2022 Weight: 170 lbs      AT/AF Burden: <1% (taking Eliquis)          Attempted call to patient and unable to reach.  Transmission reviewed.   ED visit 11/19    CorVue Thoracic impedance suggesting normal fluid accumulation with the exception of possible fluid accumulation from 11/11-11/21.   Prescribed:  Furosemide 80 mg Take 80 mg by mouth daily as needed.  Pt takes extra if needed.   06/25/22 She is taking about twice a week due to her body can't handle Furosemide daily.   Potassium 20 mEq take 1 tablet by mouth as directed.  Only take 1 tablet with extra doses of Lasix. Spirolactone 25 mg take 1 tablet by mouth daily   Labs: 08/13/2022 Creatinine 1.52, BUN 30, Potassium 3.8, Sodium 138, GFR 35 07/21/2022 Creatinine 1.07, BUN 13, Potassium 3.9, Sodium 138, GFR 53 07/08/2022 Creatinine 1.09, BUN 16, Potassium 3.6, Sodium 135, GFR 52 05/04/2022 Creatinine 1.40, BUN 23, Potassium 4.6, Sodium 134, GFR 39  04/23/2022 Creatinine 1.18, BUN 22, Potassium 3.4, Sodium 136, GFR 47  A complete set of results can be found in Results Review.   Recommendations:  Unable to reach.     Follow-up plan: ICM clinic phone appointment on 01/27/2023.   91 day device clinic remote transmission 01/13/2023.    EP/Cardiology Office Visits:  Next EP visit due 11/2023 with Dr Nelly Laurence (no recall).  Recall 05/17/2023 with Dr Diona Browner.     Copy of ICM check sent to Dr. Nelly Laurence.     3 month ICM trend: 12/23/2022.    12-14 Month ICM trend:     Karie Soda, RN 12/25/2022 4:01 PM

## 2022-12-25 NOTE — Telephone Encounter (Signed)
Remote ICM transmission received.  Attempted call to patient regarding ICM remote transmission and no answer or voice mail option.  

## 2022-12-28 ENCOUNTER — Other Ambulatory Visit: Payer: Self-pay | Admitting: Family

## 2023-01-13 ENCOUNTER — Ambulatory Visit (INDEPENDENT_AMBULATORY_CARE_PROVIDER_SITE_OTHER): Payer: Medicare Other

## 2023-01-13 DIAGNOSIS — I5022 Chronic systolic (congestive) heart failure: Secondary | ICD-10-CM | POA: Diagnosis not present

## 2023-01-13 DIAGNOSIS — I428 Other cardiomyopathies: Secondary | ICD-10-CM

## 2023-01-14 LAB — CUP PACEART REMOTE DEVICE CHECK
Battery Remaining Longevity: 9 mo
Battery Remaining Percentage: 11 %
Battery Voltage: 2.69 V
Brady Statistic AP VP Percent: 88 %
Brady Statistic AP VS Percent: 1.8 %
Brady Statistic AS VP Percent: 2.5 %
Brady Statistic AS VS Percent: 6.6 %
Brady Statistic RA Percent Paced: 88 %
Date Time Interrogation Session: 20241223020016
HighPow Impedance: 77 Ohm
HighPow Impedance: 77 Ohm
Implantable Lead Connection Status: 753985
Implantable Lead Connection Status: 753985
Implantable Lead Connection Status: 753985
Implantable Lead Implant Date: 20110912
Implantable Lead Implant Date: 20110912
Implantable Lead Implant Date: 20120710
Implantable Lead Location: 753858
Implantable Lead Location: 753859
Implantable Lead Location: 753860
Implantable Lead Model: 7122
Implantable Pulse Generator Implant Date: 20180621
Lead Channel Impedance Value: 340 Ohm
Lead Channel Impedance Value: 510 Ohm
Lead Channel Impedance Value: 600 Ohm
Lead Channel Pacing Threshold Amplitude: 0.75 V
Lead Channel Pacing Threshold Amplitude: 0.75 V
Lead Channel Pacing Threshold Amplitude: 0.75 V
Lead Channel Pacing Threshold Pulse Width: 0.5 ms
Lead Channel Pacing Threshold Pulse Width: 0.5 ms
Lead Channel Pacing Threshold Pulse Width: 0.5 ms
Lead Channel Sensing Intrinsic Amplitude: 12 mV
Lead Channel Sensing Intrinsic Amplitude: 3 mV
Lead Channel Setting Pacing Amplitude: 2 V
Lead Channel Setting Pacing Amplitude: 2 V
Lead Channel Setting Pacing Amplitude: 2 V
Lead Channel Setting Pacing Pulse Width: 0.5 ms
Lead Channel Setting Pacing Pulse Width: 0.5 ms
Lead Channel Setting Sensing Sensitivity: 0.5 mV
Pulse Gen Serial Number: 9761374
Zone Setting Status: 755011

## 2023-01-23 NOTE — Progress Notes (Deleted)
   Acute Office Visit  Subjective:     Patient ID: Melissa Paul, female    DOB: [birth year], 78 y.o.   MRN: [id number]  No chief complaint on file.   HPI SUBJECTIVE: Melissa Paul is a 78 y.o. female who complains of urinary frequency, urgency and dysuria x *** days, without flank pain, fever, chills, or abnormal vaginal discharge or bleeding.    ROS Negative unless indicated in HPI    Objective:    There were no vitals taken for this visit. BP Readings from Last 3 Encounters:  12/23/22 (!) 101/52  12/12/22 (!) 87/54  12/10/22 96/82   Wt Readings from Last 3 Encounters:  12/23/22 176 lb 3.2 oz (79.9 kg)  12/12/22 175 lb (79.4 kg)  12/10/22 175 lb (79.4 kg)      Physical Exam OBJECTIVE: Appears well, in no apparent distress.  Vital signs are normal. The abdomen is soft without tenderness, guarding, mass, rebound or organomegaly. No CVA tenderness or inguinal adenopathy noted. Urine dipstick shows {ua dip:315113}.  Micro exam: {urine micro:315114}.    No results found for any visits on 01/27/23.      Assessment & Plan:  There are no diagnoses linked to this encounter. ASSESSMENT: UTI uncomplicated without evidence of pyelonephritis  PLAN: Treatment per orders - also push fluids, may use Pyridium  OTC prn. Call or return to clinic prn if these symptoms worsen or fail to improve as anticipated.  Continue healthy lifestyle choices, including diet (rich in fruits, vegetables, and lean proteins, and low in salt and simple carbohydrates) and exercise (at least 30 minutes of moderate physical activity daily).     The above assessment and management plan was discussed with the patient. The patient verbalized understanding of and has agreed to the management plan. Patient is aware to call the clinic if they develop any new symptoms or if symptoms persist or worsen. Patient is aware when to return to the clinic for a follow-up visit. Patient educated on when it is appropriate  to go to the emergency department.  No follow-ups on file. Melissa Paul [place] Thompson, DNP [facility] [address] [place], [place] [address] [phone]  Note: This document was prepared by Melissa Paul voice dictation technology and any errors that results from this process are unintentional.

## 2023-01-25 ENCOUNTER — Other Ambulatory Visit: Payer: Self-pay | Admitting: Family

## 2023-01-27 ENCOUNTER — Ambulatory Visit: Payer: Medicare Other | Attending: Cardiovascular Disease

## 2023-01-27 ENCOUNTER — Ambulatory Visit: Payer: Medicare Other | Admitting: Nurse Practitioner

## 2023-01-27 DIAGNOSIS — I5022 Chronic systolic (congestive) heart failure: Secondary | ICD-10-CM | POA: Diagnosis not present

## 2023-01-27 DIAGNOSIS — Z95810 Presence of automatic (implantable) cardiac defibrillator: Secondary | ICD-10-CM

## 2023-01-27 NOTE — Progress Notes (Signed)
 EPIC Encounter for ICM Monitoring  Patient Name: Melissa Paul is a 78 y.o. female Date: 01/27/2023 Primary Care Physican: Lavell Bari LABOR, FNP Primary Cardiologist: McDowell/McLean Electrophysiologist: Mealor Bi-V Pacing: 90%        01/01/2022 Weight: 178 lbs 03/15/2022 Weight: 170 lbs 05/24/2022 Weight:  162 lbs 06/25/2022  Weight:  165 lbs 07/16/2022 Weight: 170 lbs  01/27/2023 Weight: 163 lbs     AT/AF Burden: <1% (taking Eliquis )          Spoke with patient and heart failure questions reviewed.  Transmission results reviewed.  Pt reports feet/ankles are swollen and painful which is usually her indication she is retaining fluid.       CorVue Thoracic impedance suggesting possible fluid accumulation starting 1/3 and trending back toward baseline.  Also suggesting possible fluid accumulation from 12/26-12/30.   Prescribed:  Furosemide  80 mg Take 80 mg by mouth daily as needed.  Pt takes extra if needed.   06/25/22 She is taking about twice a week due to her body can't handle Furosemide  daily.   Potassium 20 mEq take 1 tablet by mouth as directed.  Only take 1 tablet with extra doses of Lasix . Spirolactone 25 mg take 1 tablet by mouth daily   Labs: 12/10/2022 Creatinine 0.97, BUN 13, Potassium 3.5, Sodium 140, GFR 59 10/22/2022 Creatinine 1.31, BUN 25, Potassium 4.6, Sodium 139 08/13/2022 Creatinine 1.52, BUN 30, Potassium 3.8, Sodium 138, GFR 35 07/21/2022 Creatinine 1.07, BUN 13, Potassium 3.9, Sodium 138, GFR 53 07/08/2022 Creatinine 1.09, BUN 16, Potassium 3.6, Sodium 135, GFR 52 05/04/2022 Creatinine 1.40, BUN 23, Potassium 4.6, Sodium 134, GFR 39  04/23/2022 Creatinine 1.18, BUN 22, Potassium 3.4, Sodium 136, GFR 47  A complete set of results can be found in Results Review.   Recommendations:  She will take PRN Lasix  x 2 days.     Follow-up plan: ICM clinic phone appointment on 02/03/2023 to recheck fluid levels.   91 day device clinic remote transmission 04/14/2023.     EP/Cardiology Office Visits:  Next EP visit due 11/2023 with Dr Nancey (no recall).  Recall 05/17/2023 with Dr Debera.     Copy of ICM check sent to Dr. Nancey and Dr Debera as RICK.      3 month ICM trend: 01/27/2023.    12-14 Month ICM trend:     [name], RN 01/27/2023 12:43 PM

## 2023-02-03 ENCOUNTER — Ambulatory Visit: Payer: Medicare Other | Attending: Cardiovascular Disease

## 2023-02-03 ENCOUNTER — Telehealth: Payer: Self-pay | Admitting: Family

## 2023-02-03 DIAGNOSIS — I5022 Chronic systolic (congestive) heart failure: Secondary | ICD-10-CM

## 2023-02-03 DIAGNOSIS — Z95810 Presence of automatic (implantable) cardiac defibrillator: Secondary | ICD-10-CM

## 2023-02-03 NOTE — Progress Notes (Signed)
 EPIC Encounter for ICM Monitoring  Patient Name: Melissa Paul is a 78 y.o. female Date: 02/03/2023 Primary Care Physican: Lavell Bari LABOR, FNP Primary Cardiologist: McDowell/McLean Electrophysiologist: Mealor Bi-V Pacing: 90%        01/01/2022 Weight: 178 lbs 03/15/2022 Weight: 170 lbs 05/24/2022 Weight:  162 lbs 06/25/2022  Weight:  165 lbs 07/16/2022 Weight: 170 lbs  01/27/2023 Weight: 163 lbs   02/03/2023 Weight: 165 lbs   AT/AF Burden: <1% (taking Eliquis )          Spoke with patient and heart failure questions reviewed.  Transmission results reviewed.  Pt reports chronic swelling feet/ankles but are a little better since last week.       CorVue Thoracic impedance suggesting possible fluid accumulation starting 1/3 and trending back closer to baseline.   Prescribed:  Furosemide  80 mg Take 80 mg by mouth daily as needed.  Pt takes extra if needed.   06/25/22 She is taking about twice a week due to her body can't handle Furosemide  daily.   Potassium 20 mEq take 1 tablet by mouth as directed.  Only take 1 tablet with extra doses of Lasix . Spirolactone 25 mg take 1 tablet by mouth daily   Labs: 12/10/2022 Creatinine 0.97, BUN 13, Potassium 3.5, Sodium 140, GFR 59 10/22/2022 Creatinine 1.31, BUN 25, Potassium 4.6, Sodium 139 08/13/2022 Creatinine 1.52, BUN 30, Potassium 3.8, Sodium 138, GFR 35 07/21/2022 Creatinine 1.07, BUN 13, Potassium 3.9, Sodium 138, GFR 53 07/08/2022 Creatinine 1.09, BUN 16, Potassium 3.6, Sodium 135, GFR 52 05/04/2022 Creatinine 1.40, BUN 23, Potassium 4.6, Sodium 134, GFR 39  04/23/2022 Creatinine 1.18, BUN 22, Potassium 3.4, Sodium 136, GFR 47  A complete set of results can be found in Results Review.   Recommendations:  She will continue PRN Lasix  as needed per symptoms such as weight gain, abdominal bloating and feet/ankle swelling.     Follow-up plan: ICM clinic phone appointment on 03/03/2023.   91 day device clinic remote transmission 04/14/2023.     EP/Cardiology Office Visits:  Next EP visit due 11/2023 with Dr Nancey (no recall).  Recall 05/17/2023 with Dr Debera.     Copy of ICM check sent to Dr. Nancey.   3 month ICM trend: 02/03/2023.    12-14 Month ICM trend:     [name], RN 02/03/2023 4:32 PM

## 2023-02-03 NOTE — Telephone Encounter (Signed)
 Copied from CRM [id number]. Topic: Appointments - Scheduling Inquiry for Clinic >> Feb 03, 2023  3:37 PM Elle L wrote: Reason for CRM: The patient needs to reschedule her bone density imaging appointment. Her call back number is [phone].

## 2023-02-04 ENCOUNTER — Other Ambulatory Visit: Payer: Medicare Other

## 2023-02-06 ENCOUNTER — Ambulatory Visit: Payer: Medicare Other | Admitting: Urology

## 2023-02-06 ENCOUNTER — Encounter: Payer: Self-pay | Admitting: Urology

## 2023-02-06 VITALS — BP 120/69 | HR 83

## 2023-02-06 DIAGNOSIS — Z889 Allergy status to unspecified drugs, medicaments and biological substances status: Secondary | ICD-10-CM

## 2023-02-06 DIAGNOSIS — N390 Urinary tract infection, site not specified: Secondary | ICD-10-CM

## 2023-02-06 DIAGNOSIS — N993 Prolapse of vaginal vault after hysterectomy: Secondary | ICD-10-CM

## 2023-02-06 DIAGNOSIS — N952 Postmenopausal atrophic vaginitis: Secondary | ICD-10-CM

## 2023-02-06 DIAGNOSIS — N3946 Mixed incontinence: Secondary | ICD-10-CM

## 2023-02-06 LAB — URINALYSIS, ROUTINE W REFLEX MICROSCOPIC
Bilirubin, UA: NEGATIVE
Glucose, UA: NEGATIVE
Ketones, UA: NEGATIVE
Nitrite, UA: NEGATIVE
Specific Gravity, UA: 1.025 (ref 1.005–1.030)
Urobilinogen, Ur: 0.2 mg/dL (ref 0.2–1.0)
pH, UA: 6 (ref 5.0–7.5)

## 2023-02-06 LAB — MICROSCOPIC EXAMINATION

## 2023-02-06 MED ORDER — GEMTESA 75 MG PO TABS: 1.0000 | ORAL_TABLET | Freq: Every day | ORAL | Status: DC

## 2023-02-06 NOTE — Progress Notes (Signed)
Subjective: 1. Recurrent UTI   2. Mixed stress and urge urinary incontinence   3. Atrophic vaginitis   4. Vaginal vault prolapse after hysterectomy   5. Multiple drug allergies      02/06/23: Negin returns today in f/u for her history of chronic cysittis and MUI with prolapse.  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  She was supposed to have urodynamics but didn't get that done. She is not currently on antibiotics and hasn't had the fosfomycin in the last month because of cost but doesn't think it helped much..  She is having low back pain and suprapubic pain.  She has no dysuria but she has malodorous urine.  She has had no hematuria.  She has chronic low grade fever.  She has no pneumoturia since her episode of emphysematous cystitis.  She still has incontinence.  She remains on vagifem for her vaginal atrophy.     12/12/22: Lissa was in the ER on 11/19 and was found to have emphysematous cystitis.  She was given Gentamicin and a foley was placed.   She had previously been on Fosfomycin for suppression every 10 days.  Her culture is growing Klebsiella but sensitivities are pending.     11/14/22: Channing returns today in f/u for her history below.  She is on topical estrogen and has been on fosfomycin q10days for suppression.   She was seen in July and had Klebsiella that was sensitive to all meds tested but ampicillin.   Her Cr was 1.31 on 10/22/22.   KUB on 08/13/22 showed no stones. She has MUI and has prolapse. She has no dysuria but has chronic suprapubic pain. There are no aggravators.  She has improved bowel function.  She feels like she is voiding better but still has some urgency.   The topical estrogen still causes some burning.    [name] is a 78 yo female who is sent for recurrent UTI's with ESBL e. Coli on a culture in April with Mx antibiotic allergies including sulfa, PCN, keflex, Nitrofurantoin, levaquin and doxycycline.   She had a CT stone study on 04/23/22.  She has  stable minimally complex bilateral renal cysts.  She was most recently treated with fosfomycin on 05/04/22 and had Cipro on 04/23/22 but was resistant.  She has bladder pressure and urgency and she has to stand and bend over to start her stream.  Her PVR is only 33ml.  She has prolapse and had a pessary but was unable to retain it.  She has been on topical estrogen but is off of it now.  She has no history of stones. She has had no GU surgery.  She has no pneumoturia or fecaluria.  She has had no hematuria.  She has had pyelonephritis in the past.  She has malodorous urine.  She wears pads for MUI.    ROS:  Review of Systems  Constitutional:  Positive for chills and malaise/fatigue.  HENT:  Positive for sinus pain.   Respiratory:  Positive for cough and shortness of breath.   Cardiovascular:  Positive for leg swelling.  Gastrointestinal:  Positive for nausea.  Musculoskeletal:  Positive for back pain and joint pain.  Skin:  Positive for itching.  Neurological:  Positive for dizziness and weakness.  Endo/Heme/Allergies:  Bruises/bleeds easily.  Psychiatric/Behavioral:  The patient is nervous/anxious.     Allergies  Allergen Reactions   Buspar [Buspirone] Other (See Comments)    Patient states that she became hyper and had lots  of itching.   Lexapro [Escitalopram Oxalate] Other (See Comments)    Patient states that she became hyper and lots of itching   Trulicity [Dulaglutide] Hives and Itching    Tongue was numb.   Bactrim Rash   Cephalexin Swelling and Rash    Says she is able to take cefuroxime Throat swelling   Chlorhexidine Gluconate Hives and Itching    Hives, itching after using wipes with last procedure   Clindamycin Rash        Contrast Media [Iodinated Contrast Media] Rash   Crestor [Rosuvastatin] Hives and Rash   Doxycycline Rash   Latex Rash        Levofloxacin Rash        Nitrofurantoin Monohyd Macro Rash        Penicillins Swelling and Rash    Has patient had a  PCN reaction causing immediate rash, facial/tongue/throat swelling, SOB or lightheadedness with hypotension: Yes Has patient had a PCN reaction causing severe rash involving mucus membranes or skin necrosis: Yes Has patient had a PCN reaction that required hospitalization: No Has patient had a PCN reaction occurring within the last 10 years: No Rash/swelled tongue If all of the above answers are "NO", then may proceed with Cephalosporin use.     Past Medical History:  Diagnosis Date   Anxiety    Chronic systolic heart failure (HCC)    Constipation    Cyst of right kidney 11/12/2017   Glucose intolerance (pre-diabetes)    Hyperlipemia    Hypothyroidism    LBBB (left bundle branch block)    Mitral regurgitation    Mild to Moderate   Nonischemic cardiomyopathy (HCC)    LVEF 35-40% by echo 8/13   Paroxysmal atrial fibrillation (HCC)    Brief episodes by device interrogation   UTI (urinary tract infection)    Recurrent   Ventricular tachycardia (HCC)    ICD therapy for VT    Past Surgical History:  Procedure Laterality Date   APPENDECTOMY     BIV ICD GENERATOR CHANGEOUT N/A 07/11/2016   Procedure: BiV ICD Generator Changeout;  Surgeon: Hillis Range, MD;  Location: [facility] INVASIVE CV LAB;  Service: Cardiovascular;  Laterality: N/A;   CARDIAC DEFIBRILLATOR PLACEMENT  09/2009   St. Jude BiV ICD by Dr. Shanna Cisco class III   CHOLECYSTECTOMY     has one ovary left     KNEE ARTHROSCOPY     Left   LEFT AND RIGHT HEART CATHETERIZATION WITH CORONARY ANGIOGRAM N/A 04/12/2014   Procedure: LEFT AND RIGHT HEART CATHETERIZATION WITH CORONARY ANGIOGRAM;  Surgeon: Marykay Lex, MD;  Location: [facility] CATH LAB;  Service: Cardiovascular;  Laterality: N/A;   Left breast biopsy x 2     no maliganancy   PACEMAKER IMPLANT     RIGHT HEART CATH N/A 10/27/2020   Procedure: RIGHT HEART CATH;  Surgeon: Laurey Morale, MD;  Location[facility] INVASIVE CV LAB;  Service: Cardiovascular;  Laterality: N/A;    RIGHT/LEFT HEART CATH AND CORONARY ANGIOGRAPHY N/A 03/26/2019   Procedure: RIGHT/LEFT HEART CATH AND CORONARY ANGIOGRAPHY;  Surgeon: Laurey Morale, MD;  Location: [facility] INVASIVE CV LAB;  Service: Cardiovascular;  Laterality: N/A;   TOTAL ABDOMINAL HYSTERECTOMY     (R) ovary removed    Social History   Socioeconomic History   Marital status: Widowed    Spouse name: Not on file   Number of children: 2   Years of education: Not on file   Highest education level: Not on file  Occupational History   Not on file  Tobacco Use   Smoking status: Former    Current packs/day: 0.00    Average packs/day: 0.3 packs/day for 46.5 years (13.9 ttl pk-yrs)    Types: Cigarettes    Start date: 08/03/1949    Quit date: 01/22/1996    Years since quitting: 27.0    Passive exposure: Never   Smokeless tobacco: Never  Vaping Use   Vaping status: Never Used  Substance and Sexual Activity   Alcohol use: No    Alcohol/week: 0.0 standard drinks of alcohol   Drug use: No   Sexual activity: Not Currently    Birth control/protection: Surgical    Comment: hyst  Other Topics Concern   Not on file  Social History Narrative   Her daughter, [name] lives with her - takes her to all visits and cooks for her.   Social Drivers of Corporate investment banker Strain: Low Risk  (09/11/2022)   Overall Financial Resource Strain (CARDIA)    Difficulty of Paying Living Expenses: Not hard at all  Food Insecurity: No Food Insecurity (09/11/2022)   Hunger Vital Sign    Worried About Running Out of Food in the Last Year: Never true    Ran Out of Food in the Last Year: Never true  Transportation Needs: No Transportation Needs (09/11/2022)   PRAPARE - Administrator, Civil Service (Medical): No    Lack of Transportation (Non-Medical): No  Physical Activity: Inactive (09/11/2022)   Exercise Vital Sign    Days of Exercise per Week: 0 days    Minutes of Exercise per Session: 0 min  Stress: No Stress Concern Present  (09/11/2022)   [employer] of Occupational Health - Occupational Stress Questionnaire    Feeling of Stress : Not at all  Social Connections: Moderately Isolated (09/11/2022)   Social Connection and Isolation Panel [NHANES]    Frequency of Communication with Friends and Family: More than three times a week    Frequency of Social Gatherings with Friends and Family: More than three times a week    Attends Religious Services: 1 to 4 times per year    Active Member of [employer] or Organizations: No    Attends Banker Meetings: Never    Marital Status: Widowed  Intimate Partner Violence: Not At Risk (09/11/2022)   Humiliation, Afraid, Rape, and Kick questionnaire    Fear of Current or Ex-Partner: No    Emotionally Abused: No    Physically Abused: No    Sexually Abused: No    Family History  Problem Relation Age of Onset   CVA Mother        multiple   Heart Problems Mother        "weak heart"   Heart disease Maternal Grandmother    Heart disease Maternal Grandfather    Cancer Father        Stomach and eshpogus   Diabetes Neg Hx    Hypertension Neg Hx    Coronary artery disease Neg Hx     Anti-infectives: Anti-infectives (From admission, onward)    None       Current Outpatient Medications  Medication Sig Dispense Refill   acetaminophen (TYLENOL) 650 MG CR tablet Take 650 mg by mouth every 8 (eight) hours as needed for pain.     albuterol (PROVENTIL) (2.5 MG/3ML) 0.083% nebulizer solution Take 3 mLs (2.5 mg total) by nebulization every 6 (six) hours as needed for wheezing or shortness of  breath. 150 mL 1   apixaban (ELIQUIS) 5 MG TABS tablet Take 1 tablet (5 mg total) by mouth 2 (two) times daily. 180 tablet 2   blood glucose meter kit and supplies 1 each by Other route 2 (two) times daily as needed for other. Dispense based on patient and insurance preference. . (FOR ICD-10 E10.9, E11.9).Dispense based on patient and insurance preference. 1 each 0   carvedilol  (COREG) 12.5 MG tablet Take 1 tablet (12.5 mg total) by mouth 2 (two) times daily. 180 tablet 1   Cyanocobalamin (B-12) 1000 MCG TABS Take by mouth.     Estradiol 10 MCG TABS vaginal tablet Place 1 tablet (10 mcg total) vaginally 2 (two) times a week. 8 tablet 11   furosemide (LASIX) 80 MG tablet Take 1 tablet (80 mg total) by mouth daily as needed for fluid. 90 tablet 1   glucose blood (GLUCOSE METER TEST) test strip Use BID 100 each 12   HYDROcodone-acetaminophen (NORCO) 5-325 MG tablet Take 1 tablet by mouth every 6 (six) hours as needed for moderate pain (pain score 4-6). 5 tablet 0   hydrOXYzine (VISTARIL) 25 MG capsule Take 1 capsule (25 mg total) by mouth every 8 (eight) hours as needed. 60 capsule 2   levothyroxine (SYNTHROID) 88 MCG tablet Take 1 tablet (88 mcg total) by mouth daily before breakfast. **NEEDS TO BE SEEN BEFORE NEXT REFILL** 30 tablet 0   metFORMIN (GLUCOPHAGE-XR) 750 MG 24 hr tablet Take 1 tablet by mouth twice daily 60 tablet 0   pantoprazole (PROTONIX) 40 MG tablet TAKE 1 TABLET BY MOUTH ONCE DAILY BEFORE BREAKFAST (Patient taking differently: Take 40 mg by mouth daily.) 90 tablet 1   polyethylene glycol (MIRALAX) 17 g packet Take 17 g by mouth daily. (Patient taking differently: Take 17 g by mouth daily as needed for mild constipation or moderate constipation.) 14 each 0   spironolactone (ALDACTONE) 25 MG tablet Take 1 tablet (25 mg total) by mouth daily. 90 tablet 1   Vibegron (GEMTESA) 75 MG TABS Take 1 tablet (75 mg total) by mouth daily.     dapagliflozin propanediol (FARXIGA) 10 MG TABS tablet Take 1 tablet (10 mg total) by mouth daily. (Patient not taking: Reported on 02/06/2023) 90 tablet 6   No current facility-administered medications for this visit.     Objective: Vital signs in last 24 hours: BP 120/69   Pulse 83   Intake/Output from previous day: No intake/output data recorded. Intake/Output this shift: @IOTHISSHIFT @   Physical Exam Vitals  reviewed.  Constitutional:      Appearance: Normal appearance.  Neurological:     Mental Status: She is alert.     Lab Results:      BMET No results for input(s): "NA", "K", "CL", "CO2", "GLUCOSE", "BUN", "CREATININE", "CALCIUM" in the last 72 hours.  Results for orders placed or performed in visit on 02/06/23 (from the past 72 hours)  Urinalysis, Routine w reflex microscopic     Status: Abnormal   Collection Time: 02/06/23 11:20 AM  Result Value Ref Range   Specific Gravity, UA 1.025 1.005 - 1.030   pH, UA 6.0 5.0 - 7.5   Color, UA Yellow Yellow   Appearance Ur Clear Clear   Leukocytes,UA 2+ (A) Negative   Protein,UA Trace Negative/Trace   Glucose, UA Negative Negative   Ketones, UA Negative Negative   RBC, UA Trace (A) Negative   Bilirubin, UA Negative Negative   Urobilinogen, Ur 0.2 0.2 - 1.0 mg/dL  Nitrite, UA Negative Negative   Microscopic Examination See below:     Comment: Microscopic was indicated and was performed.  Microscopic Examination     Status: Abnormal   Collection Time: 02/06/23 11:20 AM   Urine  Result Value Ref Range   WBC, UA 11-30 (A) 0 - 5 /hpf   RBC, Urine 0-2 0 - 2 /hpf   Epithelial Cells (non renal) 0-10 0 - 10 /hpf   Bacteria, UA Many (A) None seen/Few   *Note: Due to a large number of results and/or encounters for the requested time period, some results have not been displayed. A complete set of results can be found in Results Review.     PT/INR No results for input(s): "LABPROT", "INR" in the last 72 hours. ABG No results for input(s): "PHART", "HCO3" in the last 72 hours.  Invalid input(s): "PCO2", "PO2"     Assessment/Plan: Recurrent UTI with mild symptoms.   I will get a culture today and we can consider treatment accordingly.\  Vaginal atrophy with moderate prolapse.  She is on vagifem.   MUI.  This is getting worse.  She missed her Urodynamics appointment.  I will have her try Gemtesa and if that doesn't help, we can  reschedule the UDS.      Meds ordered this encounter  Medications   Vibegron (GEMTESA) 75 MG TABS    Sig: Take 1 tablet (75 mg total) by mouth daily.     Orders Placed This Encounter  Procedures   Urine Culture   Microscopic Examination   Urinalysis, Routine w reflex microscopic   BLADDER SCAN AMB NON-IMAGING     Return in about 4 weeks (around 03/06/2023) for 4-6 weeks with sarah to assess response to med. .    CC: [name] NP      Bjorn Pippin 02/07/2023

## 2023-02-06 NOTE — Telephone Encounter (Signed)
Called and spoke to patient she will cb to schedule - has to get with her daughter about what day is good for her to bring her

## 2023-02-09 LAB — URINE CULTURE

## 2023-02-10 ENCOUNTER — Other Ambulatory Visit: Payer: Self-pay | Admitting: Urology

## 2023-02-10 MED ORDER — CEFUROXIME AXETIL 250 MG PO TABS: 250.0000 mg | ORAL_TABLET | Freq: Two times a day (BID) | ORAL | 1 refills | Status: DC

## 2023-02-11 ENCOUNTER — Inpatient Hospital Stay: Payer: Medicare Other | Attending: Physician Assistant

## 2023-02-11 ENCOUNTER — Telehealth: Payer: Self-pay

## 2023-02-11 DIAGNOSIS — R0602 Shortness of breath: Secondary | ICD-10-CM | POA: Diagnosis not present

## 2023-02-11 DIAGNOSIS — R519 Headache, unspecified: Secondary | ICD-10-CM | POA: Diagnosis not present

## 2023-02-11 DIAGNOSIS — R5383 Other fatigue: Secondary | ICD-10-CM | POA: Insufficient documentation

## 2023-02-11 DIAGNOSIS — E538 Deficiency of other specified B group vitamins: Secondary | ICD-10-CM | POA: Diagnosis not present

## 2023-02-11 DIAGNOSIS — R42 Dizziness and giddiness: Secondary | ICD-10-CM | POA: Diagnosis not present

## 2023-02-11 DIAGNOSIS — D649 Anemia, unspecified: Secondary | ICD-10-CM

## 2023-02-11 DIAGNOSIS — R110 Nausea: Secondary | ICD-10-CM | POA: Diagnosis not present

## 2023-02-11 DIAGNOSIS — D509 Iron deficiency anemia, unspecified: Secondary | ICD-10-CM | POA: Insufficient documentation

## 2023-02-11 DIAGNOSIS — R0609 Other forms of dyspnea: Secondary | ICD-10-CM | POA: Diagnosis not present

## 2023-02-11 DIAGNOSIS — Z79899 Other long term (current) drug therapy: Secondary | ICD-10-CM | POA: Insufficient documentation

## 2023-02-11 DIAGNOSIS — R634 Abnormal weight loss: Secondary | ICD-10-CM | POA: Insufficient documentation

## 2023-02-11 DIAGNOSIS — I509 Heart failure, unspecified: Secondary | ICD-10-CM | POA: Diagnosis not present

## 2023-02-11 DIAGNOSIS — N1832 Chronic kidney disease, stage 3b: Secondary | ICD-10-CM | POA: Insufficient documentation

## 2023-02-11 DIAGNOSIS — R079 Chest pain, unspecified: Secondary | ICD-10-CM | POA: Insufficient documentation

## 2023-02-11 LAB — IRON AND TIBC
Iron: 81 ug/dL (ref 28–170)
Saturation Ratios: 20 % (ref 10.4–31.8)
TIBC: 408 ug/dL (ref 250–450)
UIBC: 327 ug/dL

## 2023-02-11 LAB — CBC WITH DIFFERENTIAL/PLATELET
Abs Immature Granulocytes: 0.04 10*3/uL (ref 0.00–0.07)
Basophils Absolute: 0 10*3/uL (ref 0.0–0.1)
Basophils Relative: 0 %
Eosinophils Absolute: 0 10*3/uL (ref 0.0–0.5)
Eosinophils Relative: 0 %
HCT: 42.4 % (ref 36.0–46.0)
Hemoglobin: 13.8 g/dL (ref 12.0–15.0)
Immature Granulocytes: 1 %
Lymphocytes Relative: 38 %
Lymphs Abs: 2.2 10*3/uL (ref 0.7–4.0)
MCH: 28.3 pg (ref 26.0–34.0)
MCHC: 32.5 g/dL (ref 30.0–36.0)
MCV: 86.9 fL (ref 80.0–100.0)
Monocytes Absolute: 0.5 10*3/uL (ref 0.1–1.0)
Monocytes Relative: 8 %
Neutro Abs: 3.1 10*3/uL (ref 1.7–7.7)
Neutrophils Relative %: 53 %
Platelets: 211 10*3/uL (ref 150–400)
RBC: 4.88 MIL/uL (ref 3.87–5.11)
RDW: 18.2 % — ABNORMAL HIGH (ref 11.5–15.5)
WBC: 5.9 10*3/uL (ref 4.0–10.5)
nRBC: 0 % (ref 0.0–0.2)

## 2023-02-11 LAB — COMPREHENSIVE METABOLIC PANEL
ALT: 10 U/L (ref 0–44)
AST: 15 U/L (ref 15–41)
Albumin: 5 g/dL (ref 3.5–5.0)
Alkaline Phosphatase: 61 U/L (ref 38–126)
Anion gap: 12 (ref 5–15)
BUN: 20 mg/dL (ref 8–23)
CO2: 29 mmol/L (ref 22–32)
Calcium: 9.4 mg/dL (ref 8.9–10.3)
Chloride: 98 mmol/L (ref 98–111)
Creatinine, Ser: 1.27 mg/dL — ABNORMAL HIGH (ref 0.44–1.00)
GFR, Estimated: 43 mL/min — ABNORMAL LOW (ref >60)
Glucose, Bld: 131 mg/dL — ABNORMAL HIGH (ref 70–99)
Potassium: 3.8 mmol/L (ref 3.5–5.1)
Sodium: 139 mmol/L (ref 135–145)
Total Bilirubin: 1.1 mg/dL (ref 0.0–1.2)
Total Protein: 7.9 g/dL (ref 6.5–8.1)

## 2023-02-11 LAB — LACTATE DEHYDROGENASE: LDH: 168 U/L (ref 98–192)

## 2023-02-11 LAB — FERRITIN: Ferritin: 25 ng/mL (ref 11–307)

## 2023-02-11 LAB — VITAMIN B12: Vitamin B-12: 219 pg/mL (ref 180–914)

## 2023-02-11 NOTE — Telephone Encounter (Signed)
Call Pt to let her know that MD sent in Rx based on results of her urine culture

## 2023-02-11 NOTE — Telephone Encounter (Signed)
-----   Message from Bjorn Pippin sent at 02/10/2023  7:11 PM EST ----- I sent cefuroxime.   She has tolerated that in the past. ----- Message ----- From: Sarajane Jews, CMA Sent: 02/10/2023   8:48 AM EST To: Bjorn Pippin, MD  Please review

## 2023-02-12 LAB — KAPPA/LAMBDA LIGHT CHAINS
Kappa free light chain: 24.8 mg/L — ABNORMAL HIGH (ref 3.3–19.4)
Kappa, lambda light chain ratio: 1.36 (ref 0.26–1.65)
Lambda free light chains: 18.2 mg/L (ref 5.7–26.3)

## 2023-02-13 LAB — METHYLMALONIC ACID, SERUM: Methylmalonic Acid, Quantitative: 1014 nmol/L — ABNORMAL HIGH (ref 0–378)

## 2023-02-17 NOTE — Progress Notes (Unsigned)
VIRTUAL VISIT via TELEPHONE NOTE [facility]   I connected with Melissa Paul  on 02/18/23 at  8:45 AM by telephone and verified that I am speaking with the correct person using two identifiers.  Location: Patient: Home Provider: [facility]   I discussed the limitations, risks, security and privacy concerns of performing an evaluation and management service by telephone and the availability of in person appointments. I also discussed with the patient that there may be a patient responsible charge related to this service. The patient expressed understanding and agreed to proceed.  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  She was seen by Rojelio Brenner PA-C on 12/23/2022.  She continues to take vitamin B12 1000 mcg daily.   At today's visit, she reports feeling somewhat poorly due to nausea and dry heaving for the past 3 weeks which has made it difficult for her to eat and reportedly resulted in 15 pound weight loss.  Apart from this, she does have some increased fatigue.  No ice pica.  She continues to report headaches and dizziness.  She has intermittent chest pain and dyspnea on exertion attributed to her CHF (following with cardiology).  She has not noticed any major hemorrhoid bleeding since prior to Christmas; no melena, hematochezia, or epistaxis. She has 50% energy and low appetite.    ASSESSMENT & PLAN:  1.  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  Reports that she is unable to "go under" for future EGD/colonoscopy due to her CHF. - Initial hematology labs (October 2024): Hgb 9.3/MCV 77, ferritin 3, iron saturation 5%.  Vitamin B12 low at 133/elevated MMA 1611.  Normal folate and copper.  She has CKD stage IIIb. - Received IV Monoferric x 1 g on 12/06/2022 (given with PREMEDICATION due to multiple medication allergies) - Received vitamin B12 1000 mcg x 1 on 12/06/2022, and started taking vitamin B12 1000 mcg daily tablet  - Reports moderate to severe hemorrhoid bleeding occurring about once a month.  Denies melena. - Most recent labs (02/11/2023): Hgb 13.8/MCV 86.9 Iron deficiency: Ferritin 25, iron saturation 20%, TIBC 408 Vitamin B12 low but marginally improved at 219, with MMA elevated at 1014 SPEP and immunofixation PENDING.  Minimally elevated kappa light chain 24.8, normal lambda 18.2, normal FLC ratio 1.36. Normal LDH.  Creatinine 1.27/GFR 43 (baseline CKD). - Symptomatic with fatigue - PLAN: Recommend IV Monoferric x 1 (PREMEDICATION due to multiple medication allergies) - We will switch to monthly B12 injections due to lack of improvement from vitamin B12 tablet  - Recommended referral to GI for hemorrhoid banding.  Patient declines at this time. - Labs and RTC in 3 months    2.  Nausea & weight loss - Patient reports nausea and dry heaving for the past 3 weeks which has made it difficult for her to eat and reportedly resulted in 15 pound weight loss. - PLAN: Patient advised to call  her PCP today to set up office visit ASAP for further evaluation of the symptoms.  Patient verbalizes agreement with this.  3.  Social/Family History: - Lives at home with daughter. Independent of ADL's and dependent of IADL's. Quit tobacco use 25 years ago.  - No family history of anemia. Father had stomach cancer.    PLAN SUMMARY: >> IV Monoferric x 1 >> Monthly B12 injections  >> Labs in 3 months  = CBC/D, ferritin, iron/TIBC, B12, MMA  >> OFFICE visit in 3 months (1 week after labs)     REVIEW OF SYSTEMS:   Review of Systems  Constitutional:  Positive for malaise/fatigue and weight loss. Negative for chills, diaphoresis and fever.  Respiratory:  Positive for shortness of breath. Negative for cough.   Cardiovascular:  Positive for chest pain. Negative for palpitations.  Gastrointestinal:  Positive for nausea. Negative for abdominal pain, blood in stool, melena and vomiting.  Neurological:  Positive for dizziness, tingling and headaches.     PHYSICAL EXAM: (per limitations of virtual telephone visit)  The patient is alert and oriented x 3, exhibiting adequate mentation, good mood, and ability to speak in full sentences and execute sound judgement.  WRAP UP:   I discussed the assessment and treatment plan with the patient. The patient was provided an opportunity to ask questions and all were answered. The patient agreed with the plan and demonstrated an understanding of the instructions.   The patient was advised to call back or seek an in-person evaluation if the symptoms worsen or if the condition fails to improve as anticipated.  I provided 22 minutes of non-face-to-face time during this encounter, with >10 minutes of medical discussion  Darolyn Rua 02/18/23 9:13 AM

## 2023-02-18 ENCOUNTER — Inpatient Hospital Stay: Payer: Medicare Other | Admitting: Physician Assistant

## 2023-02-18 DIAGNOSIS — E538 Deficiency of other specified B group vitamins: Secondary | ICD-10-CM | POA: Diagnosis not present

## 2023-02-18 DIAGNOSIS — D500 Iron deficiency anemia secondary to blood loss (chronic): Secondary | ICD-10-CM | POA: Diagnosis not present

## 2023-02-18 LAB — IMMUNOFIXATION ELECTROPHORESIS
IgA: 317 mg/dL (ref 64–422)
IgG (Immunoglobin G), Serum: 832 mg/dL (ref 586–1602)
IgM (Immunoglobulin M), Srm: 70 mg/dL (ref 26–217)
Total Protein ELP: 7.2 g/dL (ref 6.0–8.5)

## 2023-02-19 LAB — PROTEIN ELECTROPHORESIS, SERUM
A/G Ratio: 1.5 (ref 0.7–1.7)
Albumin ELP: 4.4 g/dL (ref 2.9–4.4)
Alpha-1-Globulin: 0.2 g/dL (ref 0.0–0.4)
Alpha-2-Globulin: 0.7 g/dL (ref 0.4–1.0)
Beta Globulin: 1.2 g/dL (ref 0.7–1.3)
Gamma Globulin: 0.7 g/dL (ref 0.4–1.8)
Globulin, Total: 2.9 g/dL (ref 2.2–3.9)
Total Protein ELP: 7.3 g/dL (ref 6.0–8.5)

## 2023-02-24 NOTE — Progress Notes (Signed)
 Remote ICD transmission.

## 2023-02-24 NOTE — Addendum Note (Signed)
Addended by: Geralyn Flash D on: 02/24/2023 12:28 PM   Modules accepted: Orders

## 2023-02-25 ENCOUNTER — Inpatient Hospital Stay: Payer: Medicare Other

## 2023-03-04 ENCOUNTER — Inpatient Hospital Stay: Payer: Medicare Other

## 2023-03-04 ENCOUNTER — Ambulatory Visit: Payer: Medicare Other | Attending: Cardiovascular Disease

## 2023-03-04 DIAGNOSIS — I5022 Chronic systolic (congestive) heart failure: Secondary | ICD-10-CM

## 2023-03-04 DIAGNOSIS — Z95810 Presence of automatic (implantable) cardiac defibrillator: Secondary | ICD-10-CM | POA: Diagnosis not present

## 2023-03-04 DIAGNOSIS — N3281 Overactive bladder: Secondary | ICD-10-CM | POA: Insufficient documentation

## 2023-03-04 NOTE — Progress Notes (Unsigned)
EPIC Encounter for ICM Monitoring  Patient Name: Melissa Paul is a 78 y.o. female Date: 03/04/2023 Primary Care Physican: Junie Spencer, FNP Primary Cardiologist: [facility] Electrophysiologist: Mealor Bi-V Pacing: 90%        01/01/2022 Weight: 178 lbs 03/15/2022 Weight: 170 lbs 05/24/2022 Weight:  162 lbs 06/25/2022  Weight:  165 lbs 07/16/2022 Weight: 170 lbs  01/27/2023 Weight: 163 lbs   02/03/2023 Weight: 165 lbs 03/05/2023 Weight: 163 lbs (up 4 lbs)   AT/AF Burden: <1% (taking Eliquis)  Battery Longevity: 7 months          Spoke with patient and heart failure questions reviewed.  Transmission results reviewed.  Pt reports bilateral swelling of feet/legs.   She is unintentionally losing weight which has been discussed with PCP.  She has a cold and another UTI.  She is schedule for iron infusions.   Getting iron infusions.     CorVue Thoracic impedance suggesting possible fluid accumulation starting 2/6.   Prescribed:  Furosemide 80 mg Take 1 tablet (80 mg total) by mouth daily as needed.  Pt takes extra if needed.   06/25/22 She is taking about twice a week due to her body can't handle Furosemide daily.   Spirolactone 25 mg take 1 tablet by mouth daily   Labs: 02/11/2023 Creatinine 1.27, BUN 20, Potassium 3.8, Sodium 139, GFR 43 12/10/2022 Creatinine 0.97, BUN 13, Potassium 3.5, Sodium 140, GFR 59 10/22/2022 Creatinine 1.31, BUN 25, Potassium 4.6, Sodium 139 08/13/2022 Creatinine 1.52, BUN 30, Potassium 3.8, Sodium 138, GFR 35 07/21/2022 Creatinine 1.07, BUN 13, Potassium 3.9, Sodium 138, GFR 53 07/08/2022 Creatinine 1.09, BUN 16, Potassium 3.6, Sodium 135, GFR 52 05/04/2022 Creatinine 1.40, BUN 23, Potassium 4.6, Sodium 134, GFR 39  04/23/2022 Creatinine 1.18, BUN 22, Potassium 3.4, Sodium 136, GFR 47  A complete set of results can be found in Results Review.   Recommendations:   She started taking PRN Lasix 2 days and will continue until leg swelling resolves or improves.     Follow-up plan: ICM clinic phone appointment on 03/11/2023 for 31 day and to recheck fluid levels.   91 day device clinic remote transmission 04/14/2023.    EP/Cardiology Office Visits:  11/28/2023 with Dr Nelly Laurence.  Recall 05/17/2023 with Dr Diona Browner.   Recall 04/03/2022 with Dr Shirlee Latch (last visit 12/04/2021)   Copy of ICM check sent to Dr. Nelly Laurence and Dr Diona Browner as Lorain Childes.    3 month ICM trend: 03/04/2023.    12-14 Month ICM trend:     Karie Soda, RN 03/04/2023 7:29 AM

## 2023-03-04 NOTE — Progress Notes (Unsigned)
Name: Melissa Paul DOB: [birth year] MRN: [id number]  History of Present Illness: Melissa Paul is a 78 y.o. female who presents today for follow up visit at [facility] . - GU history: 1. OAB with urinary frequency, nocturia, urgency. - Likely exacerbated by diuretic use (Lasix and Spironolactone) and glucosuria due to [place]. 2. Mixed urinary incontinence (urge predominant). 3. Recurrent UTI. - Complicated by patient antibiotic allergies including sulfa, PCN, Keflex, Nitrofurantoin, Levaquin, and Doxycycline.  - No history of stones; KUB on 08/13/2022 was negative. 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 - Was previously on Fosfomycin for suppression every 10 days.  4. Vaginal atrophy. - Previously used topical vaginal estrogen cream (caused burning sensation). - Currently uses Vagifem vaginal estradiol tablets 2x/week.  5. Lichen sclerosus. 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  - Failed prior pessary trial (unable to retain). 7. Stable minimally complex bilateral renal cysts.  8. Constipation with chronic suprapubic pain.   At last visit with Dr. Annabell Howells on 02/06/2023: - "She was supposed to have urodynamics but didn't get that done. She is not currently on antibiotics and hasn't had the fosfomycin in the last month because of cost but doesn't think it helped much." - Reported low back pain, suprapubic pain, malodorous urine, chronic low grade fever, worsening urinary incontinence. - Plan: "I will have her try Gemtesa and if that doesn't help, we can reschedule the UDS."   Since last visit: Urine culture from 02/06/2023 came back positive for Klebsiella pneumoniae. Treated with Ceftin (Cefuroxime) 250 mg 2x/day for 10 days (has tolerated previously).  Today: She reports symptomatic improvement since starting Gemtesa (Vibegron) 75 mg daily. She reports  decreased  urinary frequency, urgency, and  urge incontinence. She is down to using 2 pull ups per day.   She feels however that her UTI has not fully resolved since taking the Ceftin last month. She reports bladder, vaginal, and bilateral mid-to-low back pain for the last 7-10 days. Reports chronic low grade fever with no acute changes. She denies dysuria, gross hematuria, straining to void, or sensations of incomplete emptying.  Reports chronic nausea and has had decreased appetite & decreased dietary intake over the past month, which she reports has caused weight loss of >20 pounds. Also reports headaches, dizziness, and fatigue; states she is supposed to get an iron infusion soon and seeing her PCP. She is followed by hematology. Denies any recent falls.   Fall Screening: Do you usually have a device to assist in your mobility? Yes - walker   Medications: Current Outpatient Medications  Medication Sig Dispense Refill   acetaminophen (TYLENOL) 650 MG CR tablet Take 650 mg by mouth every 8 (eight) hours as needed for pain.     albuterol (PROVENTIL) (2.5 MG/3ML) 0.083% nebulizer solution Take 3 mLs (2.5 mg total) by nebulization every 6 (six) hours as needed for wheezing or shortness of breath. 150 mL 1   apixaban (ELIQUIS) 5 MG TABS tablet Take 1 tablet (5 mg total) by mouth 2 (two) times daily. 180 tablet 2   blood glucose meter kit and supplies 1 each by Other route 2 (two) times daily as needed for other. Dispense based on patient and insurance preference. . (FOR ICD-10 E10.9, E11.9).Dispense based on patient and insurance preference. 1 each 0   carvedilol (COREG) 12.5 MG tablet Take 1 tablet (12.5 mg total) by mouth 2 (two) times daily. 180 tablet 1   Estradiol 10  MCG TABS vaginal tablet Place 1 tablet (10 mcg total) vaginally 2 (two) times a week. 8 tablet 11   fosfomycin (MONUROL) 3 g PACK One dose = one packet (Fosfomycin 3 grams). Instructions - Day 1: Take 1 dose. Day 4: Take second dose. Day 7: Take third dose. 9 g 0    furosemide (LASIX) 80 MG tablet Take 1 tablet (80 mg total) by mouth daily as needed for fluid. 90 tablet 1   glucose blood (GLUCOSE METER TEST) test strip Use BID 100 each 12   hydrOXYzine (VISTARIL) 25 MG capsule Take 1 capsule (25 mg total) by mouth every 8 (eight) hours as needed. 60 capsule 2   levothyroxine (SYNTHROID) 88 MCG tablet Take 1 tablet (88 mcg total) by mouth daily before breakfast. **NEEDS TO BE SEEN BEFORE NEXT REFILL** 30 tablet 0   metFORMIN (GLUCOPHAGE-XR) 750 MG 24 hr tablet Take 1 tablet by mouth twice daily 60 tablet 0   methenamine (HIPREX) 1 g tablet Take 1 tablet (1 g total) by mouth 2 (two) times daily with a meal. Most effective when taken with a daily Vitamin C supplement. 60 tablet 11   pantoprazole (PROTONIX) 40 MG tablet TAKE 1 TABLET BY MOUTH ONCE DAILY BEFORE BREAKFAST (Patient taking differently: Take 40 mg by mouth daily.) 90 tablet 1   polyethylene glycol (MIRALAX) 17 g packet Take 17 g by mouth daily. (Patient taking differently: Take 17 g by mouth daily as needed for mild constipation or moderate constipation.) 14 each 0   spironolactone (ALDACTONE) 25 MG tablet Take 1 tablet (25 mg total) by mouth daily. 90 tablet 1   Cyanocobalamin (B-12) 1000 MCG TABS Take by mouth. (Patient not taking: Reported on 03/06/2023)     dapagliflozin propanediol (FARXIGA) 10 MG TABS tablet Take 1 tablet (10 mg total) by mouth daily. (Patient not taking: Reported on 03/06/2023) 90 tablet 6   HYDROcodone-acetaminophen (NORCO) 5-325 MG tablet Take 1 tablet by mouth every 6 (six) hours as needed for moderate pain (pain score 4-6). (Patient not taking: Reported on 03/06/2023) 5 tablet 0   Vibegron (GEMTESA) 75 MG TABS Take 1 tablet (75 mg total) by mouth daily. 30 tablet 11   No current facility-administered medications for this visit.    Allergies: Allergies  Allergen Reactions   Buspar [Buspirone] Other (See Comments)    Patient states that she became hyper and had lots of  itching.   Lexapro [Escitalopram Oxalate] Other (See Comments)    Patient states that she became hyper and lots of itching   Trulicity [Dulaglutide] Hives and Itching    Tongue was numb.   Bactrim Rash   Cephalexin Swelling and Rash    Says she is able to take cefuroxime Throat swelling   Chlorhexidine Gluconate Hives and Itching    Hives, itching after using wipes with last procedure   Clindamycin Rash        Contrast Media [Iodinated Contrast Media] Rash   Crestor [Rosuvastatin] Hives and Rash   Doxycycline Rash   Latex Rash        Levofloxacin Rash        Nitrofurantoin Monohyd Macro Rash        Penicillins Swelling and Rash    Has patient had a PCN reaction causing immediate rash, facial/tongue/throat swelling, SOB or lightheadedness with hypotension: Yes Has patient had a PCN reaction causing severe rash involving mucus membranes or skin necrosis: Yes Has patient had a PCN reaction that required hospitalization: No Has patient  had a PCN reaction occurring within the last 10 years: No Rash/swelled tongue If all of the above answers are "NO", then may proceed with Cephalosporin use.     Past Medical History:  Diagnosis Date   Anxiety    Chronic systolic heart failure (HCC)    Constipation    Cyst of right kidney 11/12/2017   Glucose intolerance (pre-diabetes)    Hyperlipemia    Hypothyroidism    LBBB (left bundle branch block)    Mitral regurgitation    Mild to Moderate   Nonischemic cardiomyopathy (HCC)    LVEF 35-40% by echo 8/13   Paroxysmal atrial fibrillation (HCC)    Brief episodes by device interrogation   UTI (urinary tract infection)    Recurrent   Ventricular tachycardia (HCC)    ICD therapy for VT   Past Surgical History:  Procedure Laterality Date   APPENDECTOMY     BIV ICD GENERATOR CHANGEOUT N/A 07/11/2016   Procedure: BiV ICD Generator Changeout;  Surgeon: Hillis Range, MD;  Location: [facility] INVASIVE CV LAB;  Service: Cardiovascular;   Laterality: N/A;   CARDIAC DEFIBRILLATOR PLACEMENT  09/2009   St. Jude BiV ICD by Dr. Shanna Cisco class III   CHOLECYSTECTOMY     has one ovary left     KNEE ARTHROSCOPY     Left   LEFT AND RIGHT HEART CATHETERIZATION WITH CORONARY ANGIOGRAM N/A 04/12/2014   Procedure: LEFT AND RIGHT HEART CATHETERIZATION WITH CORONARY ANGIOGRAM;  Surgeon: Marykay Lex, MD;  Location: [facility] CATH LAB;  Service: Cardiovascular;  Laterality: N/A;   Left breast biopsy x 2     no maliganancy   PACEMAKER IMPLANT     RIGHT HEART CATH N/A 10/27/2020   Procedure: RIGHT HEART CATH;  Surgeon: Laurey Morale, MD;  Location: [facility] INVASIVE CV LAB;  Service: Cardiovascular;  Laterality: N/A;   RIGHT/LEFT HEART CATH AND CORONARY ANGIOGRAPHY N/A 03/26/2019   Procedure: RIGHT/LEFT HEART CATH AND CORONARY ANGIOGRAPHY;  Surgeon: Laurey Morale, MD;  Location: [facility] INVASIVE CV LAB;  Service: Cardiovascular;  Laterality: N/A;   TOTAL ABDOMINAL HYSTERECTOMY     (R) ovary removed   Family History  Problem Relation Age of Onset   CVA Mother        multiple   Heart Problems Mother        "weak heart"   Heart disease Maternal Grandmother    Heart disease Maternal Grandfather    Cancer Father        Stomach and eshpogus   Diabetes Neg Hx    Hypertension Neg Hx    Coronary artery disease Neg Hx    Social History   Socioeconomic History   Marital status: Widowed    Spouse name: Not on file   Number of children: 2   Years of education: Not on file   Highest education level: Not on file  Occupational History   Not on file  Tobacco Use   Smoking status: Former    Current packs/day: 0.00    Average packs/day: 0.3 packs/day for 46.5 years (13.9 ttl pk-yrs)    Types: Cigarettes    Start date: 08/03/1949    Quit date: 01/22/1996    Years since quitting: 27.1    Passive exposure: Never   Smokeless tobacco: Never  Vaping Use   Vaping status: Never Used  Substance and Sexual Activity   Alcohol use: No    Alcohol/week: 0.0  standard drinks of alcohol   Drug use: No  Sexual activity: Not Currently    Birth control/protection: Surgical    Comment: hyst  Other Topics Concern   Not on file  Social History Narrative   Her daughter, [name] lives with her - takes her to all visits and cooks for her.   Social Drivers of Corporate investment banker Strain: Low Risk  (09/11/2022)   Overall Financial Resource Strain (CARDIA)    Difficulty of Paying Living Expenses: Not hard at all  Food Insecurity: No Food Insecurity (09/11/2022)   Hunger Vital Sign    Worried About Running Out of Food in the Last Year: Never true    Ran Out of Food in the Last Year: Never true  Transportation Needs: No Transportation Needs (09/11/2022)   PRAPARE - Administrator, Civil Service (Medical): No    Lack of Transportation (Non-Medical): No  Physical Activity: Inactive (09/11/2022)   Exercise Vital Sign    Days of Exercise per Week: 0 days    Minutes of Exercise per Session: 0 min  Stress: No Stress Concern Present (09/11/2022)   [employer] of Occupational Health - Occupational Stress Questionnaire    Feeling of Stress : Not at all  Social Connections: Moderately Isolated (09/11/2022)   Social Connection and Isolation Panel [NHANES]    Frequency of Communication with Friends and Family: More than three times a week    Frequency of Social Gatherings with Friends and Family: More than three times a week    Attends Religious Services: 1 to 4 times per year    Active Member of [employer] or Organizations: No    Attends Banker Meetings: Never    Marital Status: Widowed  Intimate Partner Violence: Not At Risk (09/11/2022)   Humiliation, Afraid, Rape, and Kick questionnaire    Fear of Current or Ex-Partner: No    Emotionally Abused: No    Physically Abused: No    Sexually Abused: No    Review of Systems Constitutional: Patient denies any unintentional weight loss or change in strength lntegumentary: Patient  denies any rashes or pruritus Cardiovascular: Patient denies chest pain or syncope Respiratory: Patient denies shortness of breath Gastrointestinal: As per HPI  Musculoskeletal: Patient denies muscle cramps or weakness Neurologic: Patient denies convulsions or seizures Allergic/Immunologic: Patient denies recent allergic reaction(s) Hematologic/Lymphatic: Patient denies bleeding tendencies Endocrine: Patient denies heat/cold intolerance  GU: As per HPI.  OBJECTIVE Vitals:   03/06/23 1437  BP: 97/61  Pulse: 72   There is no height or weight on file to calculate BMI.  Physical Examination Constitutional: No obvious distress; patient is non-toxic appearing  Cardiovascular: No visible lower extremity edema.  Respiratory: The patient does not have audible wheezing/stridor; respirations do not appear labored  Gastrointestinal: Abdomen non-distended Musculoskeletal: Normal ROM of UEs  Skin: No obvious rashes/open sores  Neurologic: CN 2-12 grossly intact Psychiatric: Answered questions appropriately with normal affect  Hematologic/Lymphatic/Immunologic: No obvious bruises or sites of spontaneous bleeding  Urine microscopy: >30 WBC/hpf, 3-10 RBC/hpf, many bacteria PVR: 3 ml  ASSESSMENT Lichen sclerosus et atrophicus - Plan: Urinalysis, Routine w reflex microscopic, BLADDER SCAN AMB NON-IMAGING  Mixed stress and urge urinary incontinence - Plan: Urinalysis, Routine w reflex microscopic, BLADDER SCAN AMB NON-IMAGING, Vibegron (GEMTESA) 75 MG TABS  Recurrent UTI - Plan: Urinalysis, Routine w reflex microscopic, BLADDER SCAN AMB NON-IMAGING, fosfomycin (MONUROL) 3 g PACK, methenamine (HIPREX) 1 g tablet  Vaginal vault prolapse after hysterectomy - Plan: Urinalysis, Routine w reflex microscopic, BLADDER SCAN AMB NON-IMAGING  Atrophic vaginitis - Plan: Urinalysis, Routine w reflex microscopic, BLADDER SCAN AMB NON-IMAGING  OAB (overactive bladder) - Plan: Urinalysis, Routine w reflex  microscopic, BLADDER SCAN AMB NON-IMAGING, Vibegron (GEMTESA) 75 MG TABS  OAB with urinary frequency, nocturia, urgency, & urge incontinence: Improved with Gemtesa (Vibegron) 75 mg daily; no evidence of incomplete bladder emptying. She elected to continue that medication; refills ordered.   Complex recurrent UTI. Symptomatic for UTI today with abnormal UA. She was unable to provide enough urine today for repeat urine culture. We discussed how her management remains complicated by her multiple antibiotic allergies and recent treatment failure with Ceftin.   We agreed to treat with Fosfomycin 3 grams every 3 days x3 doses and will then start Hiprex (Methenamine hippurate) 2x/day routinely for UTI prophylaxis. Also advised ongoing use of Vagifem vaginal estradiol tablets 2x/week for vaginal atrophy and UTI prophylaxis.  She was advised to go to the ER if She develops fever >100.5 F, uncontrollable pain, or other significantly concerning symptoms.  We agreed to plan for follow up in 2-3 weeks for recheck; may consider IM Gentamicin x3 days for UTI if still symptomatic at that time along with scheduling office cystoscopy for further evaluation due to past history of emphysematous cystitis. If doing well however she can reschedule for follow up in 3 months.   Patient verbalized understanding of and agreement with current plan. All questions were answered.   PLAN Advised the following: 1. Fosfomycin 3 grams every 3 days x3 doses. 2. After completing Fosfomycin, start Hiprex (Methenamine hippurate) 2x/day routinely.  3. Continue Vagifem vaginal estradiol tablets 2x/week. 4. Return in about 2 weeks (around 03/20/2023) for UA, PVR, & f/u with Evette Georges NP (on a Monday or Tuesday).  Orders Placed This Encounter  Procedures   Urinalysis, Routine w reflex microscopic   BLADDER SCAN AMB NON-IMAGING   Total time spent caring for the patient today was over 30 minutes. This includes time spent on the  date of the visit reviewing the patient's chart before the visit, time spent during the visit, and time spent after the visit on documentation. Over 50% of that time was spent in face-to-face time with this patient for direct counseling. E&M based on time and complexity of medical decision making.  It has been explained that the patient is to follow regularly with their PCP in addition to all other providers involved in their care and to follow instructions provided by these respective offices. Patient advised to contact urology clinic if any urologic-pertaining questions, concerns, new symptoms or problems arise in the interim period.  There are no Patient Instructions on file for this visit.  Electronically signed by:  Donnita Falls, FNP   03/06/23    3:20 PM

## 2023-03-06 ENCOUNTER — Ambulatory Visit (INDEPENDENT_AMBULATORY_CARE_PROVIDER_SITE_OTHER): Payer: Medicare Other | Admitting: Urology

## 2023-03-06 VITALS — BP 97/61 | HR 72

## 2023-03-06 DIAGNOSIS — N952 Postmenopausal atrophic vaginitis: Secondary | ICD-10-CM

## 2023-03-06 DIAGNOSIS — N390 Urinary tract infection, site not specified: Secondary | ICD-10-CM

## 2023-03-06 DIAGNOSIS — N993 Prolapse of vaginal vault after hysterectomy: Secondary | ICD-10-CM

## 2023-03-06 DIAGNOSIS — L900 Lichen sclerosus et atrophicus: Secondary | ICD-10-CM | POA: Diagnosis not present

## 2023-03-06 DIAGNOSIS — Z87440 Personal history of urinary (tract) infections: Secondary | ICD-10-CM

## 2023-03-06 DIAGNOSIS — N3946 Mixed incontinence: Secondary | ICD-10-CM | POA: Diagnosis not present

## 2023-03-06 DIAGNOSIS — N3281 Overactive bladder: Secondary | ICD-10-CM

## 2023-03-06 MED ORDER — GEMTESA 75 MG PO TABS: 1.0000 | ORAL_TABLET | Freq: Every day | ORAL | 11 refills | Status: DC

## 2023-03-06 MED ORDER — METHENAMINE HIPPURATE 1 G PO TABS: 1.0000 g | ORAL_TABLET | Freq: Two times a day (BID) | ORAL | 11 refills | Status: DC

## 2023-03-06 MED ORDER — FOSFOMYCIN TROMETHAMINE 3 G PO PACK: PACK | ORAL | 0 refills | Status: DC

## 2023-03-07 LAB — URINALYSIS, ROUTINE W REFLEX MICROSCOPIC
Bilirubin, UA: NEGATIVE
Glucose, UA: NEGATIVE
Ketones, UA: NEGATIVE
Nitrite, UA: POSITIVE — AB
Protein,UA: NEGATIVE
Specific Gravity, UA: 1.015 (ref 1.005–1.030)
Urobilinogen, Ur: 0.2 mg/dL (ref 0.2–1.0)
pH, UA: 6 (ref 5.0–7.5)

## 2023-03-07 LAB — MICROSCOPIC EXAMINATION: WBC, UA: >30 /[HPF] — AB (ref 0–5)

## 2023-03-10 ENCOUNTER — Inpatient Hospital Stay: Payer: Medicare Other

## 2023-03-10 ENCOUNTER — Inpatient Hospital Stay: Payer: Medicare Other | Attending: Physician Assistant

## 2023-03-10 VITALS — BP 106/64 | HR 75 | Temp 96.3°F | Resp 20

## 2023-03-10 DIAGNOSIS — D509 Iron deficiency anemia, unspecified: Secondary | ICD-10-CM | POA: Diagnosis present

## 2023-03-10 DIAGNOSIS — Z79899 Other long term (current) drug therapy: Secondary | ICD-10-CM | POA: Diagnosis not present

## 2023-03-10 MED ORDER — FAMOTIDINE IN NACL 20-0.9 MG/50ML-% IV SOLN
20.0000 mg | Freq: Once | INTRAVENOUS | Status: AC
  Administered 2023-03-10: 20 mg via INTRAVENOUS
  Filled 2023-03-10: qty 50

## 2023-03-10 MED ORDER — SODIUM CHLORIDE 0.9 % IV SOLN
1000.0000 mg | Freq: Once | INTRAVENOUS | Status: AC
  Administered 2023-03-10: 1000 mg via INTRAVENOUS
  Filled 2023-03-10: qty 1000

## 2023-03-10 MED ORDER — CETIRIZINE HCL 10 MG/ML IV SOLN
10.0000 mg | Freq: Once | INTRAVENOUS | Status: AC
  Administered 2023-03-10: 10 mg via INTRAVENOUS
  Filled 2023-03-10: qty 1

## 2023-03-10 MED ORDER — SODIUM CHLORIDE 0.9 % IV SOLN: INTRAVENOUS | Status: DC

## 2023-03-10 MED ORDER — METHYLPREDNISOLONE SODIUM SUCC 125 MG IJ SOLR
125.0000 mg | Freq: Once | INTRAMUSCULAR | Status: AC
Start: 2023-03-10 — End: 2023-03-10
  Administered 2023-03-10: 125 mg via INTRAVENOUS
  Filled 2023-03-10: qty 2

## 2023-03-10 MED ORDER — CYANOCOBALAMIN 1000 MCG/ML IJ SOLN
1000.0000 ug | Freq: Once | INTRAMUSCULAR | Status: AC
  Administered 2023-03-10: 1000 ug via INTRAMUSCULAR
  Filled 2023-03-10: qty 1

## 2023-03-10 NOTE — Progress Notes (Signed)
Patient tolerated injection with no complaints voiced.  Site clean and dry with no bruising or swelling noted at site.  See MAR for details.  Band aid applied.  Patient stable during and after injection.  Vss with discharge and left in satisfactory condition with no s/s of distress noted.   Patient tolerated iron infusion with no complaints voiced.  Peripheral IV site clean and dry with good blood return noted before and after infusion.  Band aid applied.  VSS with discharge and left in satisfactory condition with no s/s of distress noted.

## 2023-03-10 NOTE — Patient Instructions (Signed)

## 2023-03-10 NOTE — Progress Notes (Signed)
 Marland Kitchen

## 2023-03-11 ENCOUNTER — Ambulatory Visit: Payer: Medicare Other | Attending: Cardiovascular Disease

## 2023-03-11 ENCOUNTER — Telehealth: Payer: Self-pay

## 2023-03-11 DIAGNOSIS — I5022 Chronic systolic (congestive) heart failure: Secondary | ICD-10-CM | POA: Diagnosis not present

## 2023-03-11 DIAGNOSIS — Z95810 Presence of automatic (implantable) cardiac defibrillator: Secondary | ICD-10-CM | POA: Diagnosis not present

## 2023-03-11 NOTE — Telephone Encounter (Signed)
Remote ICM transmission received.  Attempted call to patient regarding ICM remote transmission and no answer or voice mail set up. 

## 2023-03-11 NOTE — Progress Notes (Signed)
EPIC Encounter for ICM Monitoring  Patient Name: Melissa Paul is a 78 y.o. female Date: 03/11/2023 Primary Care Physican: Junie Spencer, FNP Primary Cardiologist: [place] Electrophysiologist: [facility] Bi-V Pacing: 91%        01/01/2022 Weight: 178 lbs 03/15/2022 Weight: 170 lbs 05/24/2022 Weight:  162 lbs 06/25/2022  Weight:  165 lbs 07/16/2022 Weight: 170 lbs  01/27/2023 Weight: 163 lbs   02/03/2023 Weight: 165 lbs 03/05/2023 Weight: 163 lbs (up 4 lbs)   AT/AF Burden: <1% (taking Eliquis)   Battery Longevity: 7.2 months          Attempted call to patient and unable to reach. Transmission results reviewed.     CorVue Thoracic impedance suggesting fluid levels returned to normal after taking PRN Furosemide.   Prescribed:  Furosemide 80 mg Take 1 tablet (80 mg total) by mouth daily as needed.  Pt takes extra if needed.   06/25/22 She is taking about twice a week due to her body can't handle Furosemide daily.   Spirolactone 25 mg take 1 tablet by mouth daily   Labs: 02/11/2023 Creatinine 1.27, BUN 20, Potassium 3.8, Sodium 139, GFR 43 12/10/2022 Creatinine 0.97, BUN 13, Potassium 3.5, Sodium 140, GFR 59 10/22/2022 Creatinine 1.31, BUN 25, Potassium 4.6, Sodium 139 08/13/2022 Creatinine 1.52, BUN 30, Potassium 3.8, Sodium 138, GFR 35 07/21/2022 Creatinine 1.07, BUN 13, Potassium 3.9, Sodium 138, GFR 53 07/08/2022 Creatinine 1.09, BUN 16, Potassium 3.6, Sodium 135, GFR 52 05/04/2022 Creatinine 1.40, BUN 23, Potassium 4.6, Sodium 134, GFR 39  04/23/2022 Creatinine 1.18, BUN 22, Potassium 3.4, Sodium 136, GFR 47  A complete set of results can be found in Results Review.   Recommendations:  Unable to reach.     Follow-up plan: ICM clinic phone appointment on 04/15/2023.   91 day device clinic remote transmission 04/14/2023.    EP/Cardiology Office Visits:  11/28/2023 with Dr Nelly Laurence.  Recall 05/17/2023 with Dr Diona Browner.   Aware needs to call Dr Alford Highland office for appointment.  Recall  04/03/2022 with Dr Shirlee Latch (last visit 12/04/2021)   Copy of ICM check sent to Dr. Nelly Laurence  3 month ICM trend: 03/11/2023.    12-14 Month ICM trend:     Karie Soda, RN 03/11/2023 10:28 AM

## 2023-03-18 ENCOUNTER — Other Ambulatory Visit: Payer: Self-pay | Admitting: Family

## 2023-03-18 MED ORDER — LEVOTHYROXINE SODIUM 88 MCG PO TABS: 88.0000 ug | ORAL_TABLET | Freq: Every day | ORAL | 0 refills | Status: DC

## 2023-03-18 NOTE — Addendum Note (Signed)
 Addended by: Julious Payer D on: 03/18/2023 12:03 PM   Modules accepted: Orders

## 2023-03-18 NOTE — Telephone Encounter (Signed)
 Made first available appt for March 21

## 2023-03-18 NOTE — Telephone Encounter (Signed)
 Melissa Paul pt NTBS 30-d given 01/27/23

## 2023-03-21 NOTE — Progress Notes (Signed)
 Name: Melissa Paul DOB: [birth year] MRN: [id number]  History of Present Illness: Melissa Paul is a 78 y.o. female who presents today for follow up visit at [facility]. - GU history: 1. Recurrent UTI. - Complicated by patient antibiotic allergies including sulfa, PCN, Keflex, Nitrofurantoin, Levaquin, and Doxycycline.  - No history of stones; KUB on 08/13/2022 was negative. 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 - Was previously on Fosfomycin for suppression every 10 days.  2. OAB with urinary frequency, nocturia, urgency. - Likely exacerbated by diuretic use (Lasix and Spironolactone) and glucosuria due to [place]. - Taking Gemtesa.  3. Mixed urinary incontinence (urge predominant). 4. Vaginal atrophy. - Previously used topical vaginal estrogen cream (caused burning sensation). - Currently uses Vagifem vaginal estradiol tablets 2x/week.  5. Lichen sclerosus. 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  - Failed prior pessary trial (unable to retain). - Per 06/13/22 pelvic exam by Melissa Paul: "G2 apical prolapse without rectocele or significant cystocele." 7. Stable minimally complex bilateral renal cysts.  8. Constipation with chronic suprapubic pain.   At last visit on 03/06/2023: - Reported symptomatic improvement since starting Gemtesa (Vibegron) 75 mg daily.  - Symptomatic for UTI with abnormal UA; unable to provide enough urine for repeat urine culture. - The plan was: 1. Fosfomycin 3 grams every 3 days x3 doses. 2. After completing Fosfomycin, start Hiprex (Methenamine hippurate) 2x/day routinely.  3. Continue Vagifem vaginal estradiol tablets 2x/week. 4. Return in about 2 weeks (around 03/20/2023) for UA, PVR, & f/u with Melissa Georges NP.  Today: She completed the Fosfomycin as prescribed but denies any symptomatic improvement. She continues to have significant bladder pain, bilateral low  back / coccyx pain, vaginal pain / burning sensation, fatigue, weakness, and unintentional weight loss due to nausea. She states her temperature is back to normal (was previously around 99 F consistently; now around 97.5).   Over the past 2 weeks she has started to notice a new sensation of air coming out of her vagina at various times; does not always correlate with urination or bowel movements. Denies seeing any fecal material in her urine or vagina. Denies hematuria or vaginal bleeding.   She states that her urinary urgency and frequency continue to be manageable with taking Gemtesa (Vibegron) 75 mg daily.   Denies flank pain.   Fall Screening: Do you usually have a device to assist in your mobility? Yes - cane   Medications: Current Outpatient Medications  Medication Sig Dispense Refill   diphenhydrAMINE (BENADRYL) 50 MG capsule Dispense #1. Take 1 capsule by mouth 1 hour before exam. 1 capsule 0   fluconazole (DIFLUCAN) 150 MG tablet Take once. May repeat in 3 days if symptoms fail to fully resolve after first dose. 2 tablet 0   ondansetron (ZOFRAN-ODT) 4 MG disintegrating tablet Take 1 tablet (4 mg total) by mouth every 8 (eight) hours as needed for nausea or vomiting. 20 tablet 1   predniSONE (DELTASONE) 50 MG tablet Dispense #3. Take 1 tablet by mouth 13 hours prior to exam, take 1 tab by mouth 7 hours prior to exam, then take 1 tablet by mouth 1 hour prior to exam. 3 tablet 0   trimethoprim (TRIMPEX) 100 MG tablet Take 1 tablet (100 mg total) by mouth daily. 30 tablet 11   acetaminophen (TYLENOL) 650 MG CR tablet Take 650 mg by mouth every 8 (eight) hours as needed for pain.  albuterol (PROVENTIL) (2.5 MG/3ML) 0.083% nebulizer solution Take 3 mLs (2.5 mg total) by nebulization every 6 (six) hours as needed for wheezing or shortness of breath. 150 mL 1   apixaban (ELIQUIS) 5 MG TABS tablet Take 1 tablet (5 mg total) by mouth 2 (two) times daily. 180 tablet 2   blood glucose meter  kit and supplies 1 each by Other route 2 (two) times daily as needed for other. Dispense based on patient and insurance preference. . (FOR ICD-10 E10.9, E11.9).Dispense based on patient and insurance preference. 1 each 0   carvedilol (COREG) 12.5 MG tablet Take 1 tablet (12.5 mg total) by mouth 2 (two) times daily. 180 tablet 1   Cyanocobalamin (B-12) 1000 MCG TABS Take by mouth. (Patient not taking: Reported on 03/06/2023)     dapagliflozin propanediol (FARXIGA) 10 MG TABS tablet Take 1 tablet (10 mg total) by mouth daily. (Patient not taking: Reported on 03/06/2023) 90 tablet 6   Estradiol 10 MCG TABS vaginal tablet Place 1 tablet (10 mcg total) vaginally 2 (two) times a week. 8 tablet 11   furosemide (LASIX) 80 MG tablet Take 1 tablet (80 mg total) by mouth daily as needed for fluid. 90 tablet 1   glucose blood (GLUCOSE METER TEST) test strip Use BID 100 each 12   HYDROcodone-acetaminophen (NORCO) 5-325 MG tablet Take 1 tablet by mouth every 6 (six) hours as needed for moderate pain (pain score 4-6). (Patient not taking: Reported on 03/06/2023) 5 tablet 0   hydrOXYzine (VISTARIL) 25 MG capsule Take 1 capsule (25 mg total) by mouth every 8 (eight) hours as needed. 60 capsule 2   levothyroxine (SYNTHROID) 88 MCG tablet Take 1 tablet (88 mcg total) by mouth daily before breakfast. 30 tablet 0   metFORMIN (GLUCOPHAGE-XR) 750 MG 24 hr tablet Take 1 tablet by mouth twice daily 60 tablet 0   pantoprazole (PROTONIX) 40 MG tablet TAKE 1 TABLET BY MOUTH ONCE DAILY BEFORE BREAKFAST (Patient taking differently: Take 40 mg by mouth daily.) 90 tablet 1   polyethylene glycol (MIRALAX) 17 g packet Take 17 g by mouth daily. (Patient taking differently: Take 17 g by mouth daily as needed for mild constipation or moderate constipation.) 14 each 0   spironolactone (ALDACTONE) 25 MG tablet Take 1 tablet (25 mg total) by mouth daily. 90 tablet 1   Vibegron (GEMTESA) 75 MG TABS Take 1 tablet (75 mg total) by mouth daily. 30  tablet 11   No current facility-administered medications for this visit.    Allergies: Allergies  Allergen Reactions   Buspar [Buspirone] Other (See Comments)    Patient states that she became hyper and had lots of itching.   Lexapro [Escitalopram Oxalate] Other (See Comments)    Patient states that she became hyper and lots of itching   Trulicity [Dulaglutide] Hives and Itching    Tongue was numb.   Bactrim Rash   Cephalexin Swelling and Rash    Says she is able to take cefuroxime Throat swelling   Chlorhexidine Gluconate Hives and Itching    Hives, itching after using wipes with last procedure   Clindamycin Rash        Contrast Media [Iodinated Contrast Media] Rash   Crestor [Rosuvastatin] Hives and Rash   Doxycycline Rash   Latex Rash        Levofloxacin Rash        Nitrofurantoin Monohyd Macro Rash        Penicillins Swelling and Rash    Has  patient had a PCN reaction causing immediate rash, facial/tongue/throat swelling, SOB or lightheadedness with hypotension: Yes Has patient had a PCN reaction causing severe rash involving mucus membranes or skin necrosis: Yes Has patient had a PCN reaction that required hospitalization: No Has patient had a PCN reaction occurring within the last 10 years: No Rash/swelled tongue If all of the above answers are "NO", then may proceed with Cephalosporin use.     Past Medical History:  Diagnosis Date   Anxiety    Chronic systolic heart failure (HCC)    Constipation    Cyst of right kidney 11/12/2017   Glucose intolerance (pre-diabetes)    Hyperlipemia    Hypothyroidism    LBBB (left bundle branch block)    Mitral regurgitation    Mild to Moderate   Nonischemic cardiomyopathy (HCC)    LVEF 35-40% by echo 8/13   Paroxysmal atrial fibrillation (HCC)    Brief episodes by device interrogation   UTI (urinary tract infection)    Recurrent   Ventricular tachycardia (HCC)    ICD therapy for VT   Past Surgical History:   Procedure Laterality Date   APPENDECTOMY     BIV ICD GENERATOR CHANGEOUT N/A 07/11/2016   Procedure: BiV ICD Generator Changeout;  Surgeon: Hillis Range, MD;  Location: [facility] INVASIVE CV LAB;  Service: Cardiovascular;  Laterality: N/A;   CARDIAC DEFIBRILLATOR PLACEMENT  09/2009   St. Jude BiV ICD by Dr. Shanna Cisco class III   CHOLECYSTECTOMY     has one ovary left     KNEE ARTHROSCOPY     Left   LEFT AND RIGHT HEART CATHETERIZATION WITH CORONARY ANGIOGRAM N/A 04/12/2014   Procedure: LEFT AND RIGHT HEART CATHETERIZATION WITH CORONARY ANGIOGRAM;  Surgeon: Marykay Lex, MD;  Location: [facility] CATH LAB;  Service: Cardiovascular;  Laterality: N/A;   Left breast biopsy x 2     no maliganancy   PACEMAKER IMPLANT     RIGHT HEART CATH N/A 10/27/2020   Procedure: RIGHT HEART CATH;  Surgeon: Laurey Morale, MD;  Location: [facility] INVASIVE CV LAB;  Service: Cardiovascular;  Laterality: N/A;   RIGHT/LEFT HEART CATH AND CORONARY ANGIOGRAPHY N/A 03/26/2019   Procedure: RIGHT/LEFT HEART CATH AND CORONARY ANGIOGRAPHY;  Surgeon: Laurey Morale, MD;  Location: [facility] INVASIVE CV LAB;  Service: Cardiovascular;  Laterality: N/A;   TOTAL ABDOMINAL HYSTERECTOMY     (R) ovary removed   Family History  Problem Relation Age of Onset   CVA Mother        multiple   Heart Problems Mother        "weak heart"   Heart disease Maternal Grandmother    Heart disease Maternal Grandfather    Cancer Father        Stomach and eshpogus   Diabetes Neg Hx    Hypertension Neg Hx    Coronary artery disease Neg Hx    Social History   Socioeconomic History   Marital status: Widowed    Spouse name: Not on file   Number of children: 2   Years of education: Not on file   Highest education level: Not on file  Occupational History   Not on file  Tobacco Use   Smoking status: Former    Current packs/day: 0.00    Average packs/day: 0.3 packs/day for 46.5 years (13.9 ttl pk-yrs)    Types: Cigarettes    Start date: 08/03/1949     Quit date: 01/22/1996    Years since quitting: 27.1  Passive exposure: Never   Smokeless tobacco: Never  Vaping Use   Vaping status: Never Used  Substance and Sexual Activity   Alcohol use: No    Alcohol/week: 0.0 standard drinks of alcohol   Drug use: No   Sexual activity: Not Currently    Birth control/protection: Surgical    Comment: hyst  Other Topics Concern   Not on file  Social History Narrative   Her daughter, [name] lives with her - takes her to all visits and cooks for her.   Social Drivers of Corporate investment banker Strain: Low Risk  (09/11/2022)   Overall Financial Resource Strain (CARDIA)    Difficulty of Paying Living Expenses: Not hard at all  Food Insecurity: No Food Insecurity (09/11/2022)   Hunger Vital Sign    Worried About Running Out of Food in the Last Year: Never true    Ran Out of Food in the Last Year: Never true  Transportation Needs: No Transportation Needs (09/11/2022)   PRAPARE - Administrator, Civil Service (Medical): No    Lack of Transportation (Non-Medical): No  Physical Activity: Inactive (09/11/2022)   Exercise Vital Sign    Days of Exercise per Week: 0 days    Minutes of Exercise per Session: 0 min  Stress: No Stress Concern Present (09/11/2022)   [employer] of Occupational Health - Occupational Stress Questionnaire    Feeling of Stress : Not at all  Social Connections: Moderately Isolated (09/11/2022)   Social Connection and Isolation Panel [NHANES]    Frequency of Communication with Friends and Family: More than three times a week    Frequency of Social Gatherings with Friends and Family: More than three times a week    Attends Religious Services: 1 to 4 times per year    Active Member of [employer] or Organizations: No    Attends Banker Meetings: Never    Marital Status: Widowed  Intimate Partner Violence: Not At Risk (09/11/2022)   Humiliation, Afraid, Rape, and Kick questionnaire    Fear of Current or  Ex-Partner: No    Emotionally Abused: No    Physically Abused: No    Sexually Abused: No    Review of Systems Constitutional: Patient reports unintentional weight loss and fatigue lntegumentary: Patient denies any rashes or pruritus Cardiovascular: Patient denies chest pain or syncope Respiratory: Patient denies shortness of breath Gastrointestinal: As per HPI  Musculoskeletal: Patient reports weakness Neurologic: Patient denies convulsions or seizures Allergic/Immunologic: Patient denies recent allergic reaction(s) Hematologic/Lymphatic: Patient denies bleeding tendencies Endocrine: Patient denies heat/cold intolerance  GU: As per HPI.  OBJECTIVE There were no vitals filed for this visit. There is no height or weight on file to calculate BMI.  Physical Examination Constitutional: No obvious distress; patient is non-toxic appearing  Cardiovascular: No visible lower extremity edema.  Respiratory: The patient does not have audible wheezing/stridor; respirations do not appear labored  Gastrointestinal: Abdomen non-distended Musculoskeletal: Normal ROM of UEs  Skin: No obvious rashes/open sores  Neurologic: CN 2-12 grossly intact Psychiatric: Answered questions appropriately with normal affect  Hematologic/Lymphatic/Immunologic: No obvious bruises or sites of spontaneous bleeding  GU: Patient declined pelvic exam  Urine microscopy: 11-30 WBC/hpf, 0 RBC/hpf, many bacteria PVR: 0 ml  ASSESSMENT & PLAN Recurrent UTI - Plan: Urinalysis, Routine w reflex microscopic, BLADDER SCAN AMB NON-IMAGING, CT ABDOMEN PELVIS W WO CONTRAST, trimethoprim (TRIMPEX) 100 MG tablet, diphenhydrAMINE (BENADRYL) 50 MG capsule, predniSONE (DELTASONE) 50 MG tablet, Urine culture, ondansetron (ZOFRAN-ODT) 4 MG  disintegrating tablet, CT ABDOMEN PELVIS W CONTRAST  OAB (overactive bladder) - Plan: Urinalysis, Routine w reflex microscopic, BLADDER SCAN AMB NON-IMAGING, CT ABDOMEN PELVIS W WO CONTRAST,  diphenhydrAMINE (BENADRYL) 50 MG capsule, predniSONE (DELTASONE) 50 MG tablet, CT ABDOMEN PELVIS W CONTRAST  Atrophic vaginitis - Plan: Urinalysis, Routine w reflex microscopic, BLADDER SCAN AMB NON-IMAGING, CT ABDOMEN PELVIS W WO CONTRAST, trimethoprim (TRIMPEX) 100 MG tablet, diphenhydrAMINE (BENADRYL) 50 MG capsule, predniSONE (DELTASONE) 50 MG tablet, CT ABDOMEN PELVIS W CONTRAST  Mixed stress and urge urinary incontinence - Plan: Urinalysis, Routine w reflex microscopic, BLADDER SCAN AMB NON-IMAGING, CT ABDOMEN PELVIS W WO CONTRAST, diphenhydrAMINE (BENADRYL) 50 MG capsule, predniSONE (DELTASONE) 50 MG tablet, CT ABDOMEN PELVIS W CONTRAST  UTI symptoms - Plan: CT ABDOMEN PELVIS W WO CONTRAST, trimethoprim (TRIMPEX) 100 MG tablet, diphenhydrAMINE (BENADRYL) 50 MG capsule, predniSONE (DELTASONE) 50 MG tablet, CT ABDOMEN PELVIS W CONTRAST  Hx of antibiotic allergy - Plan: CT ABDOMEN PELVIS W WO CONTRAST, diphenhydrAMINE (BENADRYL) 50 MG capsule, predniSONE (DELTASONE) 50 MG tablet, CT ABDOMEN PELVIS W CONTRAST  Contrast media allergy - Plan: CT ABDOMEN PELVIS W WO CONTRAST, trimethoprim (TRIMPEX) 100 MG tablet, diphenhydrAMINE (BENADRYL) 50 MG capsule, predniSONE (DELTASONE) 50 MG tablet, CT ABDOMEN PELVIS W CONTRAST  Malaise and fatigue - Plan: CT ABDOMEN PELVIS W WO CONTRAST, trimethoprim (TRIMPEX) 100 MG tablet, diphenhydrAMINE (BENADRYL) 50 MG capsule, predniSONE (DELTASONE) 50 MG tablet, CT ABDOMEN PELVIS W CONTRAST  Emphysematous cystitis - Plan: CT ABDOMEN PELVIS W WO CONTRAST, trimethoprim (TRIMPEX) 100 MG tablet, diphenhydrAMINE (BENADRYL) 50 MG capsule, predniSONE (DELTASONE) 50 MG tablet, CT ABDOMEN PELVIS W CONTRAST  Diverticulosis - Plan: CT ABDOMEN PELVIS W WO CONTRAST, diphenhydrAMINE (BENADRYL) 50 MG capsule, predniSONE (DELTASONE) 50 MG tablet, CT ABDOMEN PELVIS W CONTRAST  Vaginal flatus - Plan: CT ABDOMEN PELVIS W WO CONTRAST, trimethoprim (TRIMPEX) 100 MG tablet,  diphenhydrAMINE (BENADRYL) 50 MG capsule, predniSONE (DELTASONE) 50 MG tablet, CT ABDOMEN PELVIS W CONTRAST  Lower abdominal pain - Plan: CT ABDOMEN PELVIS W WO CONTRAST, trimethoprim (TRIMPEX) 100 MG tablet, diphenhydrAMINE (BENADRYL) 50 MG capsule, predniSONE (DELTASONE) 50 MG tablet, CT ABDOMEN PELVIS W CONTRAST  Bilateral low back pain without sciatica, unspecified chronicity - Plan: CT ABDOMEN PELVIS W WO CONTRAST, trimethoprim (TRIMPEX) 100 MG tablet, diphenhydrAMINE (BENADRYL) 50 MG capsule, predniSONE (DELTASONE) 50 MG tablet, CT ABDOMEN PELVIS W CONTRAST  Weakness - Plan: CT ABDOMEN PELVIS W WO CONTRAST, trimethoprim (TRIMPEX) 100 MG tablet, diphenhydrAMINE (BENADRYL) 50 MG capsule, predniSONE (DELTASONE) 50 MG tablet, CT ABDOMEN PELVIS W CONTRAST  Acute cystitis without hematuria - Plan: CT ABDOMEN PELVIS W WO CONTRAST, CT ABDOMEN PELVIS W CONTRAST  Vaginal pain - Plan: CT ABDOMEN PELVIS W CONTRAST  Antibiotic-induced yeast infection - Plan: fluconazole (DIFLUCAN) 150 MG tablet, CT ABDOMEN PELVIS W CONTRAST  Nausea - Plan: ondansetron (ZOFRAN-ODT) 4 MG disintegrating tablet, CT ABDOMEN PELVIS W CONTRAST  Acute cystitis with hematuria - Plan: CT ABDOMEN PELVIS W CONTRAST  Complex UTI refractory to repeat antibiotic treatment. Concern for recurrent emphysematous cystitis and/or fistula.   Consulted with Dr. Mena Goes. We agreed on the following recommendations: 1. Urine culture today; will treat as indicated based on results.  2. Advised patient to start Trimethoprim 100 mg daily to suppress bacteria in bladder. We discussed her listed allergy to Bactrim and that most likely it was the sulfa component which caused a reaction; she stated she will keep Benadryl on hand when she starts taking Trimethoprim just in case an allergic reaction occurs.  3. CT  abdomen/pelvis with oral and IV contrast and cystoscopy for further evaluation. Pre-treatment medications (Prednisone and Benadryl)  ordered to prevent allergic reaction to contrast dye.  4. Diflucan 150 mg once for yeast on UA. Take second dose 3 days later. 5. Zofran 4 mg sublingual every 8 hours PRN for nausea. 6. Continue Gemtesa (Vibegron) 75 mg daily.  7. Continue Vagifem vaginal estradiol tablets twice per week.   She was advised to go to the ER if She develops fever >100.5 F, uncontrollable pain, or other significantly concerning symptoms.  Patient verbalized understanding of and agreement with current plan. All questions were answered.   Orders Placed This Encounter  Procedures   Urine culture   CT ABDOMEN PELVIS W WO CONTRAST    Needs oral and IV contrast. Will order pre-treatment meds for contrast allergy.    Standing Status:   Future    Expected Date:   03/25/2023    Expiration Date:   03/23/2024    If indicated for the ordered procedure, I authorize the administration of contrast media per Radiology protocol:   Yes    Does the patient have a contrast media/X-ray dye allergy?:   Yes    Preferred imaging location?:   [facility]    If indicated for the ordered procedure, I authorize the administration of oral contrast media per Radiology protocol:   Yes   CT ABDOMEN PELVIS W CONTRAST    Needs oral and IV contrast. Pre-treatment meds ordered for contrast allergy; patient aware.    Standing Status:   Future    Expiration Date:   03/23/2024    If indicated for the ordered procedure, I authorize the administration of contrast media per Radiology protocol:   Yes    Does the patient have a contrast media/X-ray dye allergy?:   Yes    Preferred imaging location?:   [facility]    If indicated for the ordered procedure, I authorize the administration of oral contrast media per Radiology protocol:   Yes   Urinalysis, Routine w reflex microscopic   BLADDER SCAN AMB NON-IMAGING   Total time spent caring for the patient today was over 40 minutes. This includes time spent on the date of the visit reviewing  the patient's chart before the visit, time spent during the visit, and time spent after the visit on documentation. Over 50% of that time was spent in face-to-face time with this patient for direct counseling. E&M based on time and complexity of medical decision making.  It has been explained that the patient is to follow regularly with their PCP in addition to all other providers involved in their care and to follow instructions provided by these respective offices. Patient advised to contact urology clinic if any urologic-pertaining questions, concerns, new symptoms or problems arise in the interim period.  Patient Instructions  Plan: 1. Urine culture.  2. Start Trimethoprim 100 mg daily to suppress bacteria in bladder.  3. Schedule CT abdomen/pelvis with oral & IV contrast.Pre-treatment medications (Prednisone and Benadryl) have been sent to your pharmacy to prevent allergic reaction to contrast dye.  4. Follow up in office for cystoscopy (look inside bladder).  5. Continue Gemtesa (Vibegron) 75 mg daily.  6. Continue Vagifem vaginal estradiol tablets twice per week.  7. Diflucan 150 mg once for yeast infection. Take second dose 3 days later. 8. Zofran 4 mg under tongue (sublingual) every 8 hours as needed for nausea.  Electronically signed by:  Donnita Falls, FNP  03/24/23    3:16 PM

## 2023-03-24 ENCOUNTER — Ambulatory Visit (INDEPENDENT_AMBULATORY_CARE_PROVIDER_SITE_OTHER): Payer: Medicare Other | Admitting: Urology

## 2023-03-24 ENCOUNTER — Encounter: Payer: Self-pay | Admitting: Urology

## 2023-03-24 DIAGNOSIS — N3000 Acute cystitis without hematuria: Secondary | ICD-10-CM

## 2023-03-24 DIAGNOSIS — N3281 Overactive bladder: Secondary | ICD-10-CM

## 2023-03-24 DIAGNOSIS — R399 Unspecified symptoms and signs involving the genitourinary system: Secondary | ICD-10-CM

## 2023-03-24 DIAGNOSIS — K5790 Diverticulosis of intestine, part unspecified, without perforation or abscess without bleeding: Secondary | ICD-10-CM

## 2023-03-24 DIAGNOSIS — Z87440 Personal history of urinary (tract) infections: Secondary | ICD-10-CM

## 2023-03-24 DIAGNOSIS — R102 Pelvic and perineal pain: Secondary | ICD-10-CM

## 2023-03-24 DIAGNOSIS — N390 Urinary tract infection, site not specified: Secondary | ICD-10-CM

## 2023-03-24 DIAGNOSIS — Z881 Allergy status to other antibiotic agents status: Secondary | ICD-10-CM

## 2023-03-24 DIAGNOSIS — N3946 Mixed incontinence: Secondary | ICD-10-CM | POA: Diagnosis not present

## 2023-03-24 DIAGNOSIS — R5381 Other malaise: Secondary | ICD-10-CM

## 2023-03-24 DIAGNOSIS — Z91041 Radiographic dye allergy status: Secondary | ICD-10-CM

## 2023-03-24 DIAGNOSIS — R110 Nausea: Secondary | ICD-10-CM

## 2023-03-24 DIAGNOSIS — R531 Weakness: Secondary | ICD-10-CM

## 2023-03-24 DIAGNOSIS — R1030 Lower abdominal pain, unspecified: Secondary | ICD-10-CM

## 2023-03-24 DIAGNOSIS — N952 Postmenopausal atrophic vaginitis: Secondary | ICD-10-CM

## 2023-03-24 DIAGNOSIS — M545 Low back pain, unspecified: Secondary | ICD-10-CM

## 2023-03-24 DIAGNOSIS — B379 Candidiasis, unspecified: Secondary | ICD-10-CM

## 2023-03-24 DIAGNOSIS — N3080 Other cystitis without hematuria: Secondary | ICD-10-CM

## 2023-03-24 DIAGNOSIS — N898 Other specified noninflammatory disorders of vagina: Secondary | ICD-10-CM

## 2023-03-24 DIAGNOSIS — N3001 Acute cystitis with hematuria: Secondary | ICD-10-CM

## 2023-03-24 LAB — URINALYSIS, ROUTINE W REFLEX MICROSCOPIC
Bilirubin, UA: NEGATIVE
Glucose, UA: NEGATIVE
Ketones, UA: NEGATIVE
Nitrite, UA: NEGATIVE
Specific Gravity, UA: 1.025 (ref 1.005–1.030)
Urobilinogen, Ur: 0.2 mg/dL (ref 0.2–1.0)
pH, UA: 5.5 (ref 5.0–7.5)

## 2023-03-24 LAB — MICROSCOPIC EXAMINATION
Epithelial Cells (non renal): >10 /HPF — ABNORMAL HIGH (ref 0–10)
RBC, Urine: NONE SEEN /HPF (ref 0–2)

## 2023-03-24 LAB — BLADDER SCAN AMB NON-IMAGING: Scan Result: 0

## 2023-03-24 MED ORDER — ONDANSETRON 4 MG PO TBDP: 4.0000 mg | ORAL_TABLET | Freq: Three times a day (TID) | ORAL | 1 refills | Status: AC | PRN

## 2023-03-24 MED ORDER — TRIMETHOPRIM 100 MG PO TABS: 100.0000 mg | ORAL_TABLET | Freq: Every day | ORAL | 11 refills | Status: DC

## 2023-03-24 MED ORDER — PREDNISONE 50 MG PO TABS: ORAL_TABLET | ORAL | 0 refills | Status: DC

## 2023-03-24 MED ORDER — DIPHENHYDRAMINE HCL 50 MG PO CAPS: ORAL_CAPSULE | ORAL | 0 refills | Status: DC

## 2023-03-24 MED ORDER — FLUCONAZOLE 150 MG PO TABS: ORAL_TABLET | ORAL | 0 refills | Status: DC

## 2023-03-24 NOTE — Patient Instructions (Addendum)
 Plan: 1. Urine culture.  2. Start Trimethoprim 100 mg daily to suppress bacteria in bladder.  3. Schedule CT abdomen/pelvis with oral & IV contrast.Pre-treatment medications (Prednisone and Benadryl) have been sent to your pharmacy to prevent allergic reaction to contrast dye.  4. Follow up in office for cystoscopy (look inside bladder).  5. Continue Gemtesa (Vibegron) 75 mg daily.  6. Continue Vagifem vaginal estradiol tablets twice per week.  7. Diflucan 150 mg once for yeast infection. Take second dose 3 days later. 8. Zofran 4 mg under tongue (sublingual) every 8 hours as needed for nausea.

## 2023-03-25 ENCOUNTER — Ambulatory Visit (HOSPITAL_COMMUNITY)

## 2023-03-25 ENCOUNTER — Inpatient Hospital Stay: Payer: Medicare Other

## 2023-03-26 LAB — URINE CULTURE

## 2023-03-28 ENCOUNTER — Other Ambulatory Visit (HOSPITAL_COMMUNITY): Payer: Self-pay | Admitting: Cardiology

## 2023-03-28 ENCOUNTER — Telehealth: Payer: Self-pay

## 2023-03-28 MED ORDER — APIXABAN 5 MG PO TABS: 5.0000 mg | ORAL_TABLET | Freq: Two times a day (BID) | ORAL | 2 refills | Status: DC

## 2023-03-28 NOTE — Telephone Encounter (Signed)
 Patient is made aware and voiced understanding. Just hold Trimethoprim on the days she receives Gentamicin IM please

## 2023-03-28 NOTE — Telephone Encounter (Signed)
-----   Message from Donnita Falls sent at 03/27/2023  5:06 PM EST ----- Please let pt know result of urine culture. Already failed Ceftin & Fosfomycin for this. Allergic to all other oral antibiotic options. Please offer Gentamicin IM x3 days or advise her to go to ER to request IV antibiotics.

## 2023-03-28 NOTE — Telephone Encounter (Signed)
 Patient called to request RX of eliquis to local pharmacy  Refills sent Advised pt to schedule overdue follow up

## 2023-03-28 NOTE — Telephone Encounter (Signed)
 Patient is made aware and voice understanding. Patient state's she is taking Trimpex nightly and wants to know do she need to hold nightly antibiotic until she complete her Gentamicin IM x3 days. Patient is aware a message will be sent to Maralyn Sago on advisement.

## 2023-03-31 ENCOUNTER — Ambulatory Visit (HOSPITAL_COMMUNITY): Admission: RE | Admit: 2023-03-31 | Source: Ambulatory Visit

## 2023-03-31 ENCOUNTER — Inpatient Hospital Stay: Attending: Physician Assistant

## 2023-03-31 DIAGNOSIS — D509 Iron deficiency anemia, unspecified: Secondary | ICD-10-CM | POA: Insufficient documentation

## 2023-03-31 DIAGNOSIS — E538 Deficiency of other specified B group vitamins: Secondary | ICD-10-CM | POA: Insufficient documentation

## 2023-03-31 DIAGNOSIS — Z79899 Other long term (current) drug therapy: Secondary | ICD-10-CM | POA: Insufficient documentation

## 2023-03-31 NOTE — Progress Notes (Signed)
 Patient was not due today for her B12 injection, she was rescheduled and given an updated schedule as well.

## 2023-04-01 ENCOUNTER — Telehealth: Payer: Self-pay

## 2023-04-01 ENCOUNTER — Ambulatory Visit (HOSPITAL_COMMUNITY)

## 2023-04-01 ENCOUNTER — Inpatient Hospital Stay: Payer: Medicare Other

## 2023-04-01 NOTE — Telephone Encounter (Signed)
Open in error

## 2023-04-02 ENCOUNTER — Ambulatory Visit (INDEPENDENT_AMBULATORY_CARE_PROVIDER_SITE_OTHER)

## 2023-04-02 DIAGNOSIS — N390 Urinary tract infection, site not specified: Secondary | ICD-10-CM | POA: Diagnosis not present

## 2023-04-02 MED ORDER — GENTAMICIN SULFATE 40 MG/ML IJ SOLN
80.0000 mg | Freq: Once | INTRAMUSCULAR | Status: AC
  Administered 2023-04-02: 80 mg via INTRAMUSCULAR

## 2023-04-02 NOTE — Progress Notes (Signed)
 Patient receiving Gentamicin 80 mg injection per MD order  The injection site was cleaned and prepped with alcohol. A band aid applied after injection given.   IM injection  Medication: Gentamicin 80 mg Dose: 2 ml Location: left gluteal Lot: [id number] Exp: 11/22/2023  Patient tolerated well, no complications were noted  Performed by: Kennyth Lose, CMA

## 2023-04-03 ENCOUNTER — Ambulatory Visit (INDEPENDENT_AMBULATORY_CARE_PROVIDER_SITE_OTHER)

## 2023-04-03 DIAGNOSIS — N390 Urinary tract infection, site not specified: Secondary | ICD-10-CM | POA: Diagnosis not present

## 2023-04-03 MED ORDER — GENTAMICIN SULFATE 40 MG/ML IJ SOLN
80.0000 mg | Freq: Once | INTRAMUSCULAR | Status: AC
  Administered 2023-04-04: 80 mg via INTRAMUSCULAR

## 2023-04-03 NOTE — Progress Notes (Signed)
 Patient receiving Gentamicin injection per NP order  The injection site was cleaned and prepped with alcohol. A band aid applied after injection given.   IM injection  Medication: Gentamicin Dose: 80 mg Location: right shoulder Lot: [id number] Exp: 11/22/2023  Patient tolerated well, no complications were noted  Performed by: Alfonse Spruce. CMA

## 2023-04-04 ENCOUNTER — Ambulatory Visit

## 2023-04-04 DIAGNOSIS — N390 Urinary tract infection, site not specified: Secondary | ICD-10-CM | POA: Diagnosis not present

## 2023-04-04 MED ORDER — GENTAMICIN SULFATE 40 MG/ML IJ SOLN
80.0000 mg | Freq: Once | INTRAMUSCULAR | Status: AC
  Administered 2023-04-04: 80 mg via INTRAMUSCULAR

## 2023-04-04 NOTE — Progress Notes (Signed)
 Patient receiving Gentamicin injection per MD order  The injection site was cleaned and prepped with alcohol. A band aid applied after injection given.   Third injection  Medication: Gentamicin Dose: 80mg  Location: left deltoid Lot: [id number] Exp: 11/22/2023  Patient tolerated well, no complications were noted  Performed by: Gwendolyn Grant T. CMA

## 2023-04-05 ENCOUNTER — Other Ambulatory Visit: Payer: Self-pay | Admitting: Urology

## 2023-04-05 DIAGNOSIS — N952 Postmenopausal atrophic vaginitis: Secondary | ICD-10-CM

## 2023-04-05 DIAGNOSIS — R1030 Lower abdominal pain, unspecified: Secondary | ICD-10-CM

## 2023-04-05 DIAGNOSIS — Z881 Allergy status to other antibiotic agents status: Secondary | ICD-10-CM

## 2023-04-05 DIAGNOSIS — N3946 Mixed incontinence: Secondary | ICD-10-CM

## 2023-04-05 DIAGNOSIS — K5790 Diverticulosis of intestine, part unspecified, without perforation or abscess without bleeding: Secondary | ICD-10-CM

## 2023-04-05 DIAGNOSIS — M545 Low back pain, unspecified: Secondary | ICD-10-CM

## 2023-04-05 DIAGNOSIS — R531 Weakness: Secondary | ICD-10-CM

## 2023-04-05 DIAGNOSIS — N3281 Overactive bladder: Secondary | ICD-10-CM

## 2023-04-05 DIAGNOSIS — R5381 Other malaise: Secondary | ICD-10-CM

## 2023-04-05 DIAGNOSIS — N898 Other specified noninflammatory disorders of vagina: Secondary | ICD-10-CM

## 2023-04-05 DIAGNOSIS — N3080 Other cystitis without hematuria: Secondary | ICD-10-CM

## 2023-04-05 DIAGNOSIS — R399 Unspecified symptoms and signs involving the genitourinary system: Secondary | ICD-10-CM

## 2023-04-05 DIAGNOSIS — N390 Urinary tract infection, site not specified: Secondary | ICD-10-CM

## 2023-04-05 DIAGNOSIS — Z91041 Radiographic dye allergy status: Secondary | ICD-10-CM

## 2023-04-07 ENCOUNTER — Ambulatory Visit (HOSPITAL_COMMUNITY)
Admission: RE | Admit: 2023-04-07 | Discharge: 2023-04-07 | Disposition: A | Discharge status: Home / Self Care | Source: Ambulatory Visit | Attending: Urology | Admitting: Urology

## 2023-04-07 DIAGNOSIS — M545 Low back pain, unspecified: Secondary | ICD-10-CM | POA: Insufficient documentation

## 2023-04-07 DIAGNOSIS — N3281 Overactive bladder: Secondary | ICD-10-CM | POA: Diagnosis present

## 2023-04-07 DIAGNOSIS — N952 Postmenopausal atrophic vaginitis: Secondary | ICD-10-CM | POA: Diagnosis present

## 2023-04-07 DIAGNOSIS — N3000 Acute cystitis without hematuria: Secondary | ICD-10-CM | POA: Insufficient documentation

## 2023-04-07 DIAGNOSIS — N3946 Mixed incontinence: Secondary | ICD-10-CM | POA: Insufficient documentation

## 2023-04-07 DIAGNOSIS — N898 Other specified noninflammatory disorders of vagina: Secondary | ICD-10-CM | POA: Diagnosis present

## 2023-04-07 DIAGNOSIS — K5790 Diverticulosis of intestine, part unspecified, without perforation or abscess without bleeding: Secondary | ICD-10-CM | POA: Insufficient documentation

## 2023-04-07 DIAGNOSIS — N390 Urinary tract infection, site not specified: Secondary | ICD-10-CM | POA: Diagnosis present

## 2023-04-07 DIAGNOSIS — R531 Weakness: Secondary | ICD-10-CM | POA: Diagnosis present

## 2023-04-07 DIAGNOSIS — R1030 Lower abdominal pain, unspecified: Secondary | ICD-10-CM | POA: Diagnosis present

## 2023-04-07 DIAGNOSIS — Z91041 Radiographic dye allergy status: Secondary | ICD-10-CM | POA: Diagnosis present

## 2023-04-07 DIAGNOSIS — R399 Unspecified symptoms and signs involving the genitourinary system: Secondary | ICD-10-CM | POA: Diagnosis present

## 2023-04-07 DIAGNOSIS — N3080 Other cystitis without hematuria: Secondary | ICD-10-CM | POA: Diagnosis present

## 2023-04-07 DIAGNOSIS — Z881 Allergy status to other antibiotic agents status: Secondary | ICD-10-CM | POA: Diagnosis present

## 2023-04-07 DIAGNOSIS — R5383 Other fatigue: Secondary | ICD-10-CM | POA: Diagnosis present

## 2023-04-07 DIAGNOSIS — R5381 Other malaise: Secondary | ICD-10-CM | POA: Insufficient documentation

## 2023-04-07 LAB — POCT I-STAT CREATININE: Creatinine, Ser: 1.4 mg/dL — ABNORMAL HIGH (ref 0.44–1.00)

## 2023-04-07 MED ORDER — IOHEXOL 9 MG/ML PO SOLN: 500.0000 mL | ORAL | Status: AC

## 2023-04-07 MED ORDER — IOHEXOL 300 MG/ML  SOLN
100.0000 mL | Freq: Once | INTRAMUSCULAR | Status: AC | PRN
  Administered 2023-04-07: 100 mL via INTRAVENOUS

## 2023-04-07 MED ORDER — IOHEXOL 300 MG/ML  SOLN: 100.0000 mL | Freq: Once | INTRAMUSCULAR | Status: DC | PRN

## 2023-04-07 MED ORDER — IOHEXOL 9 MG/ML PO SOLN
ORAL | Status: AC
  Filled 2023-04-07: qty 1000

## 2023-04-07 MED ORDER — IOHEXOL 9 MG/ML PO SOLN
500.0000 mL | ORAL | Status: AC
  Administered 2023-04-07: 500 mL via ORAL

## 2023-04-11 ENCOUNTER — Encounter: Payer: Self-pay | Admitting: Family

## 2023-04-11 ENCOUNTER — Ambulatory Visit: Payer: Medicare Other | Admitting: Family

## 2023-04-11 VITALS — BP 93/60 | HR 71 | Temp 97.4°F | Ht 62.5 in | Wt 161.2 lb

## 2023-04-11 DIAGNOSIS — R531 Weakness: Secondary | ICD-10-CM

## 2023-04-11 DIAGNOSIS — I5022 Chronic systolic (congestive) heart failure: Secondary | ICD-10-CM | POA: Diagnosis not present

## 2023-04-11 DIAGNOSIS — I700 Atherosclerosis of aorta: Secondary | ICD-10-CM | POA: Diagnosis not present

## 2023-04-11 DIAGNOSIS — E1165 Type 2 diabetes mellitus with hyperglycemia: Secondary | ICD-10-CM | POA: Diagnosis not present

## 2023-04-11 DIAGNOSIS — N1832 Chronic kidney disease, stage 3b: Secondary | ICD-10-CM

## 2023-04-11 DIAGNOSIS — E039 Hypothyroidism, unspecified: Secondary | ICD-10-CM

## 2023-04-11 DIAGNOSIS — F411 Generalized anxiety disorder: Secondary | ICD-10-CM

## 2023-04-11 DIAGNOSIS — I480 Paroxysmal atrial fibrillation: Secondary | ICD-10-CM | POA: Diagnosis not present

## 2023-04-11 DIAGNOSIS — D509 Iron deficiency anemia, unspecified: Secondary | ICD-10-CM

## 2023-04-11 DIAGNOSIS — K219 Gastro-esophageal reflux disease without esophagitis: Secondary | ICD-10-CM

## 2023-04-11 DIAGNOSIS — K5900 Constipation, unspecified: Secondary | ICD-10-CM

## 2023-04-11 LAB — BAYER DCA HB A1C WAIVED: HB A1C (BAYER DCA - WAIVED): 6.1 % — ABNORMAL HIGH (ref 4.8–5.6)

## 2023-04-11 NOTE — Progress Notes (Addendum)
 Subjective:    Patient ID: Melissa Paul, female    DOB: 01/13/1946, 78 y.o.   MRN: [id number]  Chief Complaint  Patient presents with   Medical Management of Chronic Issues   Pt presents to the office today for chronic follow up.   She is followed by Cardiologists for A Fib, CHF, and cardiomyopathy. And has a pacemaker. She is followed by CHF clinic and weighs daily. She takes Eliquis 5 mg BID for A FIB.   She had a right heart cath on 10/27/20. She reports she is doing well.   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    Has atherosclerosis, but does not take a statin.    Has CKD and avoids NSAID's.    She followed by Urologists  for frequent UTI's.   Congestive Heart Failure Presents for follow-up visit. Associated symptoms include edema, fatigue and orthopnea. The symptoms have been stable.  Gastroesophageal Reflux She complains of belching and heartburn. This is a chronic problem. The current episode started more than 1 year ago. The problem occurs occasionally. The symptoms are aggravated by certain foods. Associated symptoms include fatigue and orthopnea. Risk factors include obesity. She has tried a PPI for the symptoms. The treatment provided moderate relief.  Diabetes She presents for her follow-up diabetic visit. She has type 2 diabetes mellitus. Hypoglycemia symptoms include nervousness/anxiousness. Associated symptoms include blurred vision, fatigue and foot paresthesias. Symptoms are stable. Diabetic complications include peripheral neuropathy. Risk factors for coronary artery disease include dyslipidemia, diabetes mellitus, hypertension, sedentary lifestyle and post-menopausal. She is following a generally healthy diet. Her overall blood glucose range is 90-110 mg/dl.  Anxiety Presents for follow-up visit. Symptoms include excessive worry and nervous/anxious behavior. Symptoms occur occasionally. The severity of symptoms is mild.     Constipation This is a chronic problem. The current episode started more than 1 year ago. The problem is unchanged. Her stool frequency is 2 to 3 times per week. She has tried laxatives for the symptoms. The treatment provided moderate relief.  Thyroid Problem Presents for follow-up visit. Symptoms include anxiety, constipation, dry skin and fatigue. The symptoms have been stable.      Review of Systems  Constitutional:  Positive for fatigue.  Eyes:  Positive for blurred vision.  Gastrointestinal:  Positive for constipation and heartburn.  Psychiatric/Behavioral:  The patient is nervous/anxious.   All other systems reviewed and are negative.      Objective:   Physical Exam Vitals reviewed.  Constitutional:      General: She is not in acute distress.    Appearance: She is well-developed.  HENT:     Head: Normocephalic and atraumatic.     Right Ear: Tympanic membrane normal.     Left Ear: Tympanic membrane normal.  Eyes:     Pupils: Pupils are equal, round, and reactive to light.  Neck:     Thyroid: No thyromegaly.  Cardiovascular:     Rate and Rhythm: Normal rate and regular rhythm.     Heart sounds: Normal heart sounds. No murmur heard. Pulmonary:     Effort: Pulmonary effort is normal. No respiratory distress.     Breath sounds: Normal breath sounds. No wheezing.  Abdominal:     General: Bowel sounds are normal. There is no distension.     Palpations: Abdomen is soft.     Tenderness: There is no abdominal tenderness.  Musculoskeletal:        General: No tenderness. Normal range  of motion.     Cervical back: Normal range of motion and neck supple.     Right lower leg: No edema.     Left lower leg: No edema (none on exam).  Skin:    General: Skin is warm and dry.     Findings: No erythema.  Neurological:     Mental Status: She is alert and oriented to person, place, and time.     Cranial Nerves: No cranial nerve deficit.     Deep Tendon Reflexes: Reflexes are  normal and symmetric.  Psychiatric:        Behavior: Behavior normal.        Thought Content: Thought content normal.        Judgment: Judgment normal.      BP 93/60   Pulse 71   Temp (!) 97.4 F (36.3 C) (Temporal)   Ht 5' 2.5" (1.588 m)   Wt 161 lb 3.2 oz (73.1 kg)   SpO2 96%   BMI 29.01 kg/m       Assessment & Plan:  Melissa Paul comes in today with chief complaint of Medical Management of Chronic Issues   Diagnosis and orders addressed:  1. Atherosclerosis of aorta (HCC) - CMP14+EGFR - Lipid panel  2. Paroxysmal atrial fibrillation (HCC) - CMP14+EGFR  3. Chronic systolic heart failure (HCC) - CMP14+EGFR  4. Constipation, unspecified constipation type - CMP14+EGFR  5. GAD (generalized anxiety disorder) - CMP14+EGFR  6. Gastroesophageal reflux disease, unspecified whether esophagitis present - CMP14+EGFR  7. Hypothyroidism, unspecified type - CMP14+EGFR  8. Iron deficiency anemia, unspecified iron deficiency anemia type - CMP14+EGFR  9. Type 2 diabetes mellitus with hyperglycemia, without long-term current use of insulin (HCC) (Primary) - Microalbumin/Creatinine Ratio, Urine - Bayer DCA Hb A1c Waived - CMP14+EGFR  10. Stage 3b chronic kidney disease (HCC) - CMP14+EGFR   11. Weakness - For home use only DME 4 wheeled rolling walker with seat ([id number])  Labs pending Continue current medications  Keep specialists follow up Health Maintenance reviewed Diet and exercise encouraged  Follow up plan: 3 months   [name], FNP

## 2023-04-11 NOTE — Patient Instructions (Signed)
 Health Maintenance After Age 79 After age 4, you are at a higher risk for certain long-term diseases and infections as well as injuries from falls. Falls are a major cause of broken bones and head injuries in people who are older than age 47. Getting regular preventive care can help to keep you healthy and well. Preventive care includes getting regular testing and making lifestyle changes as recommended by your health care provider. Talk with your health care provider about: Which screenings and tests you should have. A screening is a test that checks for a disease when you have no symptoms. A diet and exercise plan that is right for you. What should I know about screenings and tests to prevent falls? Screening and testing are the best ways to find a health problem early. Early diagnosis and treatment give you the best chance of managing medical conditions that are common after age 37. Certain conditions and lifestyle choices may make you more likely to have a fall. Your health care provider may recommend: Regular vision checks. Poor vision and conditions such as cataracts can make you more likely to have a fall. If you wear glasses, make sure to get your prescription updated if your vision changes. Medicine review. Work with your health care provider to regularly review all of the medicines you are taking, including over-the-counter medicines. Ask your health care provider about any side effects that may make you more likely to have a fall. Tell your health care provider if any medicines that you take make you feel dizzy or sleepy. Strength and balance checks. Your health care provider may recommend certain tests to check your strength and balance while standing, walking, or changing positions. Foot health exam. Foot pain and numbness, as well as not wearing proper footwear, can make you more likely to have a fall. Screenings, including: Osteoporosis screening. Osteoporosis is a condition that causes  the bones to get weaker and break more easily. Blood pressure screening. Blood pressure changes and medicines to control blood pressure can make you feel dizzy. Depression screening. You may be more likely to have a fall if you have a fear of falling, feel depressed, or feel unable to do activities that you used to do. Alcohol use screening. Using too much alcohol can affect your balance and may make you more likely to have a fall. Follow these instructions at home: Lifestyle Do not drink alcohol if: Your health care provider tells you not to drink. If you drink alcohol: Limit how much you have to: 0-1 drink a day for women. 0-2 drinks a day for men. Know how much alcohol is in your drink. In the [place] one drink equals one 12 oz bottle of beer (355 mL), one 5 oz glass of wine (148 mL), or one 1 oz glass of hard liquor (44 mL). Do not use any products that contain nicotine or tobacco. These products include cigarettes, chewing tobacco, and vaping devices, such as e-cigarettes. If you need help quitting, ask your health care provider. Activity  Follow a regular exercise program to stay fit. This will help you maintain your balance. Ask your health care provider what types of exercise are appropriate for you. If you need a cane or walker, use it as recommended by your health care provider. Wear supportive shoes that have nonskid soles. Safety  Remove any tripping hazards, such as rugs, cords, and clutter. Install safety equipment such as grab bars in bathrooms and safety rails on stairs. Keep rooms and walkways  well-lit. General instructions Talk with your health care provider about your risks for falling. Tell your health care provider if: You fall. Be sure to tell your health care provider about all falls, even ones that seem minor. You feel dizzy, tiredness (fatigue), or off-balance. Take over-the-counter and prescription medicines only as told by your health care provider. These include  supplements. Eat a healthy diet and maintain a healthy weight. A healthy diet includes low-fat dairy products, low-fat (lean) meats, and fiber from whole grains, beans, and lots of fruits and vegetables. Stay current with your vaccines. Schedule regular health, dental, and eye exams. Summary Having a healthy lifestyle and getting preventive care can help to protect your health and wellness after age 11. Screening and testing are the best way to find a health problem early and help you avoid having a fall. Early diagnosis and treatment give you the best chance for managing medical conditions that are more common for people who are older than age 28. Falls are a major cause of broken bones and head injuries in people who are older than age 48. Take precautions to prevent a fall at home. Work with your health care provider to learn what changes you can make to improve your health and wellness and to prevent falls. This information is not intended to replace advice given to you by your health care provider. Make sure you discuss any questions you have with your health care provider. Document Revised: 05/29/2020 Document Reviewed: 05/29/2020 Elsevier Patient Education  2024 [employer].

## 2023-04-11 NOTE — Addendum Note (Signed)
 Addended by: [name] A on: 04/11/2023 10:15 AM   Modules accepted: Orders

## 2023-04-12 LAB — CMP14+EGFR
ALT: 6 IU/L (ref 0–32)
AST: 11 IU/L (ref 0–40)
Albumin: 4.8 g/dL (ref 3.8–4.8)
Alkaline Phosphatase: 69 IU/L (ref 44–121)
BUN/Creatinine Ratio: 14 (ref 12–28)
BUN: 20 mg/dL (ref 8–27)
Bilirubin Total: 0.9 mg/dL (ref 0.0–1.2)
CO2: 26 mmol/L (ref 20–29)
Calcium: 9.5 mg/dL (ref 8.7–10.3)
Chloride: 96 mmol/L (ref 96–106)
Creatinine, Ser: 1.46 mg/dL — ABNORMAL HIGH (ref 0.57–1.00)
Globulin, Total: 1.9 g/dL (ref 1.5–4.5)
Glucose: 119 mg/dL — ABNORMAL HIGH (ref 70–99)
Potassium: 3.6 mmol/L (ref 3.5–5.2)
Sodium: 141 mmol/L (ref 134–144)
Total Protein: 6.7 g/dL (ref 6.0–8.5)
eGFR: 36 mL/min/{1.73_m2} — ABNORMAL LOW (ref >59)

## 2023-04-12 LAB — LIPID PANEL
Chol/HDL Ratio: 4.3 ratio (ref 0.0–4.4)
Cholesterol, Total: 185 mg/dL (ref 100–199)
HDL: 43 mg/dL (ref >39)
LDL Chol Calc (NIH): 110 mg/dL — ABNORMAL HIGH (ref 0–99)
Triglycerides: 183 mg/dL — ABNORMAL HIGH (ref 0–149)
VLDL Cholesterol Cal: 32 mg/dL (ref 5–40)

## 2023-04-13 ENCOUNTER — Other Ambulatory Visit: Payer: Self-pay | Admitting: Family

## 2023-04-14 ENCOUNTER — Ambulatory Visit (INDEPENDENT_AMBULATORY_CARE_PROVIDER_SITE_OTHER): Payer: Medicare Other

## 2023-04-14 ENCOUNTER — Inpatient Hospital Stay

## 2023-04-14 ENCOUNTER — Other Ambulatory Visit: Payer: Self-pay | Admitting: Family

## 2023-04-14 VITALS — BP 107/67 | HR 72 | Temp 98.5°F | Resp 19

## 2023-04-14 DIAGNOSIS — I5022 Chronic systolic (congestive) heart failure: Secondary | ICD-10-CM

## 2023-04-14 DIAGNOSIS — D509 Iron deficiency anemia, unspecified: Secondary | ICD-10-CM | POA: Diagnosis present

## 2023-04-14 DIAGNOSIS — E538 Deficiency of other specified B group vitamins: Secondary | ICD-10-CM | POA: Diagnosis not present

## 2023-04-14 DIAGNOSIS — Z79899 Other long term (current) drug therapy: Secondary | ICD-10-CM | POA: Diagnosis not present

## 2023-04-14 DIAGNOSIS — I428 Other cardiomyopathies: Secondary | ICD-10-CM

## 2023-04-14 LAB — CUP PACEART REMOTE DEVICE CHECK
Battery Remaining Longevity: 6 mo
Battery Remaining Percentage: 7 %
Battery Voltage: 2.65 V
Brady Statistic AP VP Percent: 88 %
Brady Statistic AP VS Percent: 2.2 %
Brady Statistic AS VP Percent: 2.3 %
Brady Statistic AS VS Percent: 5.3 %
Brady Statistic RA Percent Paced: 89 %
Date Time Interrogation Session: 20250324052915
HighPow Impedance: 77 Ohm
HighPow Impedance: 77 Ohm
Implantable Lead Connection Status: 753985
Implantable Lead Connection Status: 753985
Implantable Lead Connection Status: 753985
Implantable Lead Implant Date: 20110912
Implantable Lead Implant Date: 20110912
Implantable Lead Implant Date: 20120710
Implantable Lead Location: 753858
Implantable Lead Location: 753859
Implantable Lead Location: 753860
Implantable Lead Model: 7122
Implantable Pulse Generator Implant Date: 20180621
Lead Channel Impedance Value: 340 Ohm
Lead Channel Impedance Value: 490 Ohm
Lead Channel Impedance Value: 590 Ohm
Lead Channel Pacing Threshold Amplitude: 0.625 V
Lead Channel Pacing Threshold Amplitude: 0.75 V
Lead Channel Pacing Threshold Amplitude: 0.875 V
Lead Channel Pacing Threshold Pulse Width: 0.5 ms
Lead Channel Pacing Threshold Pulse Width: 0.5 ms
Lead Channel Pacing Threshold Pulse Width: 0.5 ms
Lead Channel Sensing Intrinsic Amplitude: 12 mV
Lead Channel Sensing Intrinsic Amplitude: 2.6 mV
Lead Channel Setting Pacing Amplitude: 2 V
Lead Channel Setting Pacing Amplitude: 2 V
Lead Channel Setting Pacing Amplitude: 2 V
Lead Channel Setting Pacing Pulse Width: 0.5 ms
Lead Channel Setting Pacing Pulse Width: 0.5 ms
Lead Channel Setting Sensing Sensitivity: 0.5 mV
Pulse Gen Serial Number: 9761374
Zone Setting Status: 755011

## 2023-04-14 MED ORDER — CYANOCOBALAMIN 1000 MCG/ML IJ SOLN
1000.0000 ug | Freq: Once | INTRAMUSCULAR | Status: AC
  Administered 2023-04-14: 1000 ug via INTRAMUSCULAR
  Filled 2023-04-14: qty 1

## 2023-04-14 NOTE — Patient Instructions (Signed)
 [facility] [place] [facility]  Discharge Instructions: Thank you for choosing [facility] to provide your oncology and hematology care.  If you have a lab appointment with the Cancer Center - please note that after April 8th, 2024, all labs will be drawn in the cancer center.  You do not have to check in or register with the main entrance as you have in the past but will complete your check-in in the cancer center.  Wear comfortable clothing and clothing appropriate for easy access to any Portacath or PICC line.   We strive to give you quality time with your provider. You may need to reschedule your appointment if you arrive late (15 or more minutes).  Arriving late affects you and other patients whose appointments are after yours.  Also, if you miss three or more appointments without notifying the office, you may be dismissed from the clinic at the provider's discretion.      For prescription refill requests, have your pharmacy contact our office and allow 72 hours for refills to be completed.    Today you received the following: Vitamin B12   Vitamin B12 Injection What is this medication? Vitamin B12 (VAHY tuh min B12) prevents and treats low vitamin B12 levels in your body. It is used in people who do not get enough vitamin B12 from their diet or when their digestive tract does not absorb enough. Vitamin B12 plays an important role in maintaining the health of your nervous system and red blood cells. This medicine may be used for other purposes; ask your health care provider or pharmacist if you have questions. COMMON BRAND NAME(S): B-12 Compliance Kit, B-12 Injection Kit, Cyomin, Dodex, LA-12, Nutri-Twelve, Physicians EZ Use B-12, Primabalt, Vitamin Deficiency Injectable System - B12 What should I tell my care team before I take this medication? They need to know if you have any of these conditions: Kidney disease Leber's  disease Megaloblastic anemia An unusual or allergic reaction to cyanocobalamin, cobalt, other medications, foods, dyes, or preservatives Pregnant or trying to get pregnant Breast-feeding How should I use this medication? This medication is injected into a muscle or deeply under the skin. It is usually given in a clinic or care team's office. However, your care team may teach you how to inject yourself. Follow all instructions. Talk to your care team about the use of this medication in children. Special care may be needed. Overdosage: If you think you have taken too much of this medicine contact a poison control center or emergency room at once. NOTE: This medicine is only for you. Do not share this medicine with others. What if I miss a dose? If you are given your dose at a clinic or care team's office, call to reschedule your appointment. If you give your own injections, and you miss a dose, take it as soon as you can. If it is almost time for your next dose, take only that dose. Do not take double or extra doses. What may interact with this medication? Alcohol Colchicine This list may not describe all possible interactions. Give your health care provider a list of all the medicines, herbs, non-prescription drugs, or dietary supplements you use. Also tell them if you smoke, drink alcohol, or use illegal drugs. Some items may interact with your medicine. What should I watch for while using this medication? Visit your care team regularly. You may need blood work done while you are  taking this medication. You may need to follow a special diet. Talk to your care team. Limit your alcohol intake and avoid smoking to get the best benefit. What side effects may I notice from receiving this medication? Side effects that you should report to your care team as soon as possible: Allergic reactions--skin rash, itching, hives, swelling of the face, lips, tongue, or throat Swelling of the ankles, hands, or  feet Trouble breathing Side effects that usually do not require medical attention (report to your care team if they continue or are bothersome): Diarrhea This list may not describe all possible side effects. Call your doctor for medical advice about side effects. You may report side effects to FDA at 1-800-FDA-1088. Where should I keep my medication? Keep out of the reach of children. Store at room temperature between 15 and 30 degrees C (59 and 85 degrees F). Protect from light. Throw away any unused medication after the expiration date. NOTE: This sheet is a summary. It may not cover all possible information. If you have questions about this medicine, talk to your doctor, pharmacist, or health care provider.  2024 Elsevier/Gold Standard (2020-09-19 00:00:00)    To help prevent nausea and vomiting after your treatment, we encourage you to take your nausea medication as directed.  BELOW ARE SYMPTOMS THAT SHOULD BE REPORTED IMMEDIATELY: *FEVER GREATER THAN 100.4 F (38 C) OR HIGHER *CHILLS OR SWEATING *NAUSEA AND VOMITING THAT IS NOT CONTROLLED WITH YOUR NAUSEA MEDICATION *UNUSUAL SHORTNESS OF BREATH *UNUSUAL BRUISING OR BLEEDING *URINARY PROBLEMS (pain or burning when urinating, or frequent urination) *BOWEL PROBLEMS (unusual diarrhea, constipation, pain near the anus) TENDERNESS IN MOUTH AND THROAT WITH OR WITHOUT PRESENCE OF ULCERS (sore throat, sores in mouth, or a toothache) UNUSUAL RASH, SWELLING OR PAIN  UNUSUAL VAGINAL DISCHARGE OR ITCHING   Items with * indicate a potential emergency and should be followed up as soon as possible or go to the Emergency Department if any problems should occur.  Please show the CHEMOTHERAPY ALERT CARD or IMMUNOTHERAPY ALERT CARD at check-in to the Emergency Department and triage nurse.  Should you have questions after your visit or need to cancel or reschedule your appointment, please contact [facility] [place] - A DEPT OF [facility] [phone]  and follow the prompts.  Office hours are 8:00 a.m. to 4:30 p.m. Monday - Friday. Please note that voicemails left after 4:00 p.m. may not be returned until the following business day.  We are closed weekends and major holidays. You have access to a nurse at all times for urgent questions. Please call the main number to the clinic [phone] and follow the prompts.  For any non-urgent questions, you may also contact your provider using MyChart. We now offer e-Visits for anyone 58 and older to request care online for non-urgent symptoms. For details visit [email or web address].   Also download the MyChart app! Go to the app store, search "MyChart", open the app, select [place], and log in with your MyChart username and password.

## 2023-04-14 NOTE — Progress Notes (Signed)
 Patient tolerated Vitamin B 12 injection with no complaints voiced.  Site clean and dry with no bruising or swelling noted.  No complaints of pain.  Discharged with vital signs stable and no signs or symptoms of distress noted.   ?

## 2023-04-15 ENCOUNTER — Other Ambulatory Visit: Admitting: Urology

## 2023-04-16 LAB — MICROALBUMIN / CREATININE URINE RATIO
Creatinine, Urine: 92.3 mg/dL
Microalb/Creat Ratio: 17 mg/g{creat} (ref 0–29)
Microalbumin, Urine: 16 ug/mL

## 2023-04-18 ENCOUNTER — Ambulatory Visit: Attending: Cardiovascular Disease

## 2023-04-18 DIAGNOSIS — I5022 Chronic systolic (congestive) heart failure: Secondary | ICD-10-CM

## 2023-04-18 DIAGNOSIS — Z95810 Presence of automatic (implantable) cardiac defibrillator: Secondary | ICD-10-CM

## 2023-04-18 NOTE — Progress Notes (Signed)
 EPIC Encounter for ICM Monitoring  Patient Name: Melissa Paul is a 78 y.o. female Date: 04/18/2023 Primary Care Physican: Junie Spencer, FNP Primary Cardiologist: [place] Electrophysiologist: Mealor Bi-V Pacing: 91%        01/01/2022 Weight: 178 lbs 03/15/2022 Weight: 170 lbs 05/24/2022 Weight:  162 lbs 06/25/2022  Weight:  165 lbs 07/16/2022 Weight: 170 lbs  01/27/2023 Weight: 163 lbs   02/03/2023 Weight: 165 lbs 03/05/2023 Weight: 163 lbs (up 4 lbs)   AT/AF Burden: <1% (taking Eliquis)   Battery Longevity: 6.1 months          Transmission results reviewed.     CorVue Thoracic impedance suggesting normal fluid levels since 2/14.   Prescribed:  Furosemide 80 mg Take 1 tablet (80 mg total) by mouth daily as needed.  Pt takes extra if needed.   06/25/22 She is taking about twice a week due to her body can't handle Furosemide daily.   Spirolactone 25 mg take 1 tablet by mouth daily   Labs: 04/11/2023 Creatinine 1.46, BUN 20, Potassium 3.6, Sodium 141, eGFR 36 02/11/2023 Creatinine 1.27, BUN 20, Potassium 3.8, Sodium 139, eGFR 43 12/10/2022 Creatinine 0.97, BUN 13, Potassium 3.5, Sodium 140, eGFR 59 10/22/2022 Creatinine 1.31, BUN 25, Potassium 4.6, Sodium 139 08/13/2022 Creatinine 1.52, BUN 30, Potassium 3.8, Sodium 138, eGFR 35 A complete set of results can be found in Results Review.   Recommendations: None   Follow-up plan: ICM clinic phone appointment on 05/19/2023.   91 day device clinic remote transmission 07/14/2023.    EP/Cardiology Office Visits:  11/28/2023 with Dr Nelly Laurence.  Recall 05/17/2023 with Dr Diona Browner.   Aware needs to call Dr Alford Highland office for appointment.  Recall 04/03/2022 with Dr Shirlee Latch (last visit 12/04/2021)   Copy of ICM check sent to Dr. Nelly Laurence.  3 month ICM trend: 04/15/2023.    12-14 Month ICM trend:     Karie Soda, RN 04/18/2023 8:17 AM

## 2023-04-22 ENCOUNTER — Other Ambulatory Visit: Payer: Self-pay | Admitting: Family

## 2023-04-25 ENCOUNTER — Inpatient Hospital Stay: Payer: Medicare Other

## 2023-04-29 ENCOUNTER — Telehealth: Payer: Self-pay

## 2023-04-29 NOTE — Telephone Encounter (Signed)
Patient called and made aware. Voiced understanding.

## 2023-04-29 NOTE — Telephone Encounter (Signed)
-----   Message from Donnita Falls sent at 04/28/2023 12:37 PM EDT ----- Please let pt know CT showed:  - Bladder is within normal limits. No findings to suggest bowel-bladder fistula on CT. - Bilateral renal cysts, benign (Bosniak I-II). No follow-up is recommended.  Please schedule f/u with MD due to complexity.

## 2023-05-02 ENCOUNTER — Inpatient Hospital Stay: Payer: Medicare Other

## 2023-05-12 ENCOUNTER — Inpatient Hospital Stay: Attending: Physician Assistant

## 2023-05-12 VITALS — BP 105/63 | HR 94 | Temp 97.7°F | Resp 18

## 2023-05-12 DIAGNOSIS — D509 Iron deficiency anemia, unspecified: Secondary | ICD-10-CM

## 2023-05-12 DIAGNOSIS — E538 Deficiency of other specified B group vitamins: Secondary | ICD-10-CM | POA: Diagnosis present

## 2023-05-12 MED ORDER — CYANOCOBALAMIN 1000 MCG/ML IJ SOLN
1000.0000 ug | Freq: Once | INTRAMUSCULAR | Status: AC
  Administered 2023-05-12: 1000 ug via INTRAMUSCULAR
  Filled 2023-05-12: qty 1

## 2023-05-12 NOTE — Patient Instructions (Signed)
 [facility] [place] - [facility]  Discharge Instructions: Thank you for choosing [facility] to provide your oncology and hematology care.  If you have a lab appointment with the Cancer Center - please note that after April 8th, 2024, all labs will be drawn in the cancer center.  You do not have to check in or register with the main entrance as you have in the past but will complete your check-in in the cancer center.  Wear comfortable clothing and clothing appropriate for easy access to any Portacath or PICC line.   We strive to give you quality time with your provider. You may need to reschedule your appointment if you arrive late (15 or more minutes).  Arriving late affects you and other patients whose appointments are after yours.  Also, if you miss three or more appointments without notifying the office, you may be dismissed from the clinic at the provider's discretion.      For prescription refill requests, have your pharmacy contact our office and allow 72 hours for refills to be completed.    Today you received the following injection: B12   To help prevent nausea and vomiting after your treatment, we encourage you to take your nausea medication as directed.  BELOW ARE SYMPTOMS THAT SHOULD BE REPORTED IMMEDIATELY: *FEVER GREATER THAN 100.4 F (38 C) OR HIGHER *CHILLS OR SWEATING *NAUSEA AND VOMITING THAT IS NOT CONTROLLED WITH YOUR NAUSEA MEDICATION *UNUSUAL SHORTNESS OF BREATH *UNUSUAL BRUISING OR BLEEDING *URINARY PROBLEMS (pain or burning when urinating, or frequent urination) *BOWEL PROBLEMS (unusual diarrhea, constipation, pain near the anus) TENDERNESS IN MOUTH AND THROAT WITH OR WITHOUT PRESENCE OF ULCERS (sore throat, sores in mouth, or a toothache) UNUSUAL RASH, SWELLING OR PAIN  UNUSUAL VAGINAL DISCHARGE OR ITCHING   Items with * indicate a potential emergency and should be followed up as soon as possible or go to the  Emergency Department if any problems should occur.  Please show the CHEMOTHERAPY ALERT CARD or IMMUNOTHERAPY ALERT CARD at check-in to the Emergency Department and triage nurse.  Should you have questions after your visit or need to cancel or reschedule your appointment, please contact [facility] [place] - A DEPT OF [facility] [phone]  and follow the prompts.  Office hours are 8:00 a.m. to 4:30 p.m. Monday - Friday. Please note that voicemails left after 4:00 p.m. may not be returned until the following business day.  We are closed weekends and major holidays. You have access to a nurse at all times for urgent questions. Please call the main number to the clinic [phone] and follow the prompts.  For any non-urgent questions, you may also contact your provider using MyChart. We now offer e-Visits for anyone 73 and older to request care online for non-urgent symptoms. For details visit [email or web address].   Also download the MyChart app! Go to the app store, search "MyChart", open the app, select [place], and log in with your MyChart username and password.

## 2023-05-12 NOTE — Progress Notes (Signed)
 B12 injection  given per orders. Patient tolerated it well without problems. Vitals stable and discharged home from clinic ambulatory. Follow up as scheduled.

## 2023-05-15 ENCOUNTER — Ambulatory Visit: Payer: Self-pay

## 2023-05-15 NOTE — Telephone Encounter (Signed)
 I called pt to try and offer a sooner appt than 05/20/23 and offered an appt with MMM tomorrow but pt declined and I advised pt she really shouldn't wait till 05/20/23 to be seen but she still declined appt and states if it gets worse she will go to ED.

## 2023-05-15 NOTE — Telephone Encounter (Signed)
 Copied from CRM [id number]. Topic: Clinical - Red Word Triage >> May 15, 2023 10:52 AM Juluis Ok wrote: [facility] that prompted transfer to Nurse Triage: pain in bladder area, back pain, foul odor with urination, difficulty urinating    Chief Complaint: Urinary pressure, pain, abdominal pain, vaginal pain. Symptoms: Above Frequency: This week Pertinent Negatives: Patient denies fever Disposition: [] ED /[] Urgent Care (no appt availability in office) / [x] Appointment(In office/virtual)/ []  [facility]/ [] Home Care/ [] Refused Recommended Disposition /[] [facility]/ []  Follow-up with PCP Additional Notes: Agrees with appointment.  Reason for Disposition  Urinating more frequently than usual (i.e., frequency)  Answer Assessment - Initial Assessment Questions 1. SYMPTOM: "What's the main symptom you're concerned about?" (e.g., frequency, incontinence)     Pressure, abdominal pain 2. ONSET: "When did the    start?"     Worse this week 3. PAIN: "Is there any pain?" If Yes, ask: "How bad is it?" (Scale: 1-10; mild, moderate, severe)     Severe 4. CAUSE: "What do you think is causing the symptoms?"     UTI 5. OTHER SYMPTOMS: "Do you have any other symptoms?" (e.g., blood in urine, fever, flank pain, pain with urination)     Low grade 6. PREGNANCY: "Is there any chance you are pregnant?" "When was your last menstrual period?"     no  Protocols used: Urinary Symptoms-A-AH

## 2023-05-19 ENCOUNTER — Encounter: Payer: Self-pay | Admitting: Family Medicine

## 2023-05-19 ENCOUNTER — Ambulatory Visit (INDEPENDENT_AMBULATORY_CARE_PROVIDER_SITE_OTHER): Admitting: Family Medicine

## 2023-05-19 ENCOUNTER — Ambulatory Visit: Attending: Cardiovascular Disease

## 2023-05-19 ENCOUNTER — Inpatient Hospital Stay: Payer: Medicare Other

## 2023-05-19 VITALS — BP 99/61 | HR 72 | Temp 98.0°F | Ht 62.5 in | Wt 161.0 lb

## 2023-05-19 DIAGNOSIS — I5022 Chronic systolic (congestive) heart failure: Secondary | ICD-10-CM

## 2023-05-19 DIAGNOSIS — R399 Unspecified symptoms and signs involving the genitourinary system: Secondary | ICD-10-CM | POA: Diagnosis not present

## 2023-05-19 DIAGNOSIS — Z95810 Presence of automatic (implantable) cardiac defibrillator: Secondary | ICD-10-CM

## 2023-05-19 LAB — URINALYSIS, COMPLETE
Bilirubin, UA: NEGATIVE
Glucose, UA: NEGATIVE
Ketones, UA: NEGATIVE
Nitrite, UA: NEGATIVE
Specific Gravity, UA: 1.02 (ref 1.005–1.030)
Urobilinogen, Ur: 0.2 mg/dL (ref 0.2–1.0)
pH, UA: 5.5 (ref 5.0–7.5)

## 2023-05-19 MED ORDER — FOSFOMYCIN TROMETHAMINE 3 G PO PACK: 3.0000 g | PACK | Freq: Once | ORAL | 0 refills | Status: AC

## 2023-05-19 NOTE — Progress Notes (Signed)
 BP 99/61   Pulse 72   Temp 98 F (36.7 C)   Ht 5' 2.5" (1.588 m)   Wt 161 lb (73 kg)   SpO2 98%   BMI 28.98 kg/m    Subjective:   Patient ID: Melissa Paul, female    DOB: 12/28/1945, 78 y.o.   MRN: [id number]  HPI: Melissa Paul is a 78 y.o. female presenting on 05/19/2023 for No chief complaint on file.   HPI Dysuria and frequency and flank pain Patient is coming in today with complaints of dysuria and frequency like burning.  She has been getting these very frequently and she has been seeing Dr. Inga Manges with urology.  She has been treated for multiple UTIs and she is actually taking trimethoprim  for prevention as well.  She has been getting gentamicin  injections from their office as well  Relevant past medical, surgical, family and social history reviewed and updated as indicated. Interim medical history since our last visit reviewed. Allergies and medications reviewed and updated.  Review of Systems  Constitutional:  Negative for chills and fever.  HENT:  Negative for congestion, ear discharge and ear pain.   Eyes:  Negative for redness and visual disturbance.  Respiratory:  Negative for chest tightness and shortness of breath.   Cardiovascular:  Negative for chest pain and leg swelling.  Gastrointestinal:  Positive for abdominal pain.  Genitourinary:  Positive for dysuria, flank pain and frequency. Negative for difficulty urinating.  Musculoskeletal:  Negative for back pain and gait problem.  Skin:  Negative for rash.  Neurological:  Negative for light-headedness and headaches.  Psychiatric/Behavioral:  Negative for agitation and behavioral problems.   All other systems reviewed and are negative.   Per HPI unless specifically indicated above   Allergies as of 05/19/2023       Reactions   Buspar  [buspirone ] Other (See Comments)   Patient states that she became hyper and had lots of itching.   Lexapro  [escitalopram  Oxalate] Other (See Comments)   Patient states  that she became hyper and lots of itching   Trulicity [dulaglutide] Hives, Itching   Tongue was numb.   Bactrim Rash   Cephalexin Swelling, Rash   Says she is able to take cefuroxime  Throat swelling   Chlorhexidine  Gluconate Hives, Itching   Hives, itching after using wipes with last procedure   Clindamycin  Rash      Contrast Media [iodinated Contrast Media] Rash   Crestor  [rosuvastatin ] Hives, Rash   Doxycycline Rash   Latex Rash      Levofloxacin Rash      Nitrofurantoin Monohyd Macro Rash      Penicillins Swelling, Rash   Has patient had a PCN reaction causing immediate rash, facial/tongue/throat swelling, SOB or lightheadedness with hypotension: Yes Has patient had a PCN reaction causing severe rash involving mucus membranes or skin necrosis: Yes Has patient had a PCN reaction that required hospitalization: No Has patient had a PCN reaction occurring within the last 10 years: No Rash/swelled tongue If all of the above answers are "NO", then may proceed with Cephalosporin use.        Medication List        Accurate as of May 19, 2023  9:50 AM. If you have any questions, ask your nurse or doctor.          albuterol  (2.5 MG/3ML) 0.083% nebulizer solution Commonly known as: PROVENTIL  Take 3 mLs (2.5 mg total) by nebulization every 6 (six) hours as needed for wheezing  or shortness of breath.   apixaban  5 MG Tabs tablet Commonly known as: ELIQUIS  Take 1 tablet (5 mg total) by mouth 2 (two) times daily.   aspirin  EC 81 MG tablet Take 81 mg by mouth daily. Swallow whole.   blood glucose meter kit and supplies 1 each by Other route 2 (two) times daily as needed for other. Dispense based on patient and insurance preference. . (FOR ICD-10 E10.9, E11.9).Dispense based on patient and insurance preference.   carvedilol  12.5 MG tablet Commonly known as: COREG  Take 1 tablet (12.5 mg total) by mouth 2 (two) times daily.   dapagliflozin  propanediol 10 MG Tabs  tablet Commonly known as: FARXIGA  Take 1 tablet (10 mg total) by mouth daily.   Estradiol  10 MCG Tabs vaginal tablet Place 1 tablet (10 mcg total) vaginally 2 (two) times a week.   fosfomycin 3 g Pack Commonly known as: MONUROL  Take 3 g by mouth once for 1 dose. Started by: Lucio Sabin Tamaira Ciriello   furosemide  80 MG tablet Commonly known as: LASIX  TAKE 1 TABLET BY MOUTH ONCE DAILY AS NEEDED FOR FLUID   Gemtesa  75 MG Tabs Generic drug: Vibegron  Take 1 tablet (75 mg total) by mouth daily.   Glucose Meter Test test strip Generic drug: glucose blood Use BID   hydrOXYzine  25 MG capsule Commonly known as: VISTARIL  Take 1 capsule (25 mg total) by mouth every 8 (eight) hours as needed.   levothyroxine  88 MCG tablet Commonly known as: SYNTHROID  TAKE 1 TABLET BY MOUTH ONCE DAILY BEFORE BREAKFAST   metFORMIN  750 MG 24 hr tablet Commonly known as: GLUCOPHAGE -XR Take 1 tablet by mouth twice daily   ondansetron  4 MG disintegrating tablet Commonly known as: ZOFRAN -ODT Take 1 tablet (4 mg total) by mouth every 8 (eight) hours as needed for nausea or vomiting.   pantoprazole  40 MG tablet Commonly known as: PROTONIX  TAKE 1 TABLET BY MOUTH ONCE DAILY BEFORE BREAKFAST What changed: when to take this   polyethylene glycol 17 g packet Commonly known as: MiraLax  Take 17 g by mouth daily. What changed:  when to take this reasons to take this   spironolactone  25 MG tablet Commonly known as: ALDACTONE  Take 1 tablet (25 mg total) by mouth daily.   trimethoprim  100 MG tablet Commonly known as: TRIMPEX  Take 1 tablet (100 mg total) by mouth daily.         Objective:   BP 99/61   Pulse 72   Temp 98 F (36.7 C)   Ht 5' 2.5" (1.588 m)   Wt 161 lb (73 kg)   SpO2 98%   BMI 28.98 kg/m   Wt Readings from Last 3 Encounters:  05/19/23 161 lb (73 kg)  04/11/23 161 lb 3.2 oz (73.1 kg)  12/23/22 176 lb 3.2 oz (79.9 kg)    Physical Exam Vitals and nursing note reviewed.   Constitutional:      General: She is not in acute distress.    Appearance: She is well-developed. She is not diaphoretic.  Eyes:     Conjunctiva/sclera: Conjunctivae normal.  Abdominal:     General: Abdomen is flat. Bowel sounds are normal. There is no distension.     Palpations: Abdomen is soft.     Tenderness: There is abdominal tenderness. There is no right CVA tenderness, left CVA tenderness, guarding or rebound.  Musculoskeletal:        General: No tenderness. Normal range of motion.  Skin:    General: Skin is warm and dry.  Findings: No rash.  Neurological:     Mental Status: She is alert and oriented to person, place, and time.     Coordination: Coordination normal.  Psychiatric:        Behavior: Behavior normal.       Assessment & Plan:   Problem List Items Addressed This Visit   None Visit Diagnoses       UTI symptoms    -  Primary   Relevant Medications   fosfomycin (MONUROL ) 3 g PACK   Other Relevant Orders   Urinalysis, Complete   Urine Culture     Will give her another fosfomycin pack but realistically I do not know if that is good to treat completely, she likely needs to go see urology again  Follow up plan: Return if symptoms worsen or fail to improve.  Counseling provided for all of the vaccine components Orders Placed This Encounter  Procedures   Urine Culture   Urinalysis, Complete    Jolyne Needs, MD [facility] 05/19/2023, 9:50 AM

## 2023-05-20 ENCOUNTER — Ambulatory Visit: Admitting: Nurse Practitioner

## 2023-05-22 LAB — URINE CULTURE

## 2023-05-23 ENCOUNTER — Telehealth: Payer: Self-pay

## 2023-05-23 NOTE — Telephone Encounter (Signed)
 Remote ICM transmission received.  Attempted call to patient regarding ICM remote transmission and no answer.

## 2023-05-23 NOTE — Progress Notes (Signed)
 EPIC Encounter for ICM Monitoring  Patient Name: Melissa Paul is a 78 y.o. female Date: 05/23/2023 Primary Care Physican: Yevette Hem, FNP Primary Cardiologist: [facility] Electrophysiologist: [facility]or Bi-V Pacing: 91%        01/01/2022 Weight: 178 lbs 03/15/2022 Weight: 170 lbs 05/24/2022 Weight:  162 lbs 06/25/2022  Weight:  165 lbs 07/16/2022 Weight: 170 lbs  01/27/2023 Weight: 163 lbs   02/03/2023 Weight: 165 lbs 03/05/2023 Weight: 163 lbs (up 4 lbs)   AT/AF Burden: <1% (taking Eliquis )   Battery Longevity: 5.0 months          Attempted call to patient and unable to reach. Transmission results reviewed.     CorVue Thoracic impedance suggesting normal fluid levels since 4/6.   Prescribed:  Furosemide  80 mg Take 1 tablet (80 mg total) by mouth daily as needed.  Pt takes extra if needed.   06/25/22 She is taking about twice a week due to her body can't handle Furosemide  daily.   Spirolactone 25 mg take 1 tablet by mouth daily   Labs: 04/11/2023 Creatinine 1.46, BUN 20, Potassium 3.6, Sodium 141, eGFR 36 02/11/2023 Creatinine 1.27, BUN 20, Potassium 3.8, Sodium 139, eGFR 43 12/10/2022 Creatinine 0.97, BUN 13, Potassium 3.5, Sodium 140, eGFR 59 10/22/2022 Creatinine 1.31, BUN 25, Potassium 4.6, Sodium 139 08/13/2022 Creatinine 1.52, BUN 30, Potassium 3.8, Sodium 138, eGFR 35 A complete set of results can be found in Results Review.   Recommendations:   Unable to reach.     Follow-up plan: ICM clinic phone appointment on 06/23/2023.   91 day device clinic remote transmission 07/14/2023.    EP/Cardiology Office Visits:  11/28/2023 with Dr Arlester Ladd.  08/06/2023 with Dr Londa Rival.   Aware needs to call Dr Charline Contras office for appointment.  Recall 04/03/2022 with Dr Mitzie Anda (last visit 12/04/2021)   Copy of ICM check sent to Dr. Arlester Ladd.  3 month ICM trend: 05/19/2023.    12-14 Month ICM trend:     Almyra Jain, RN 05/23/2023 11:41 AM

## 2023-05-26 ENCOUNTER — Inpatient Hospital Stay: Payer: Medicare Other | Admitting: Physician Assistant

## 2023-05-26 ENCOUNTER — Inpatient Hospital Stay: Payer: Medicare Other

## 2023-06-02 ENCOUNTER — Inpatient Hospital Stay: Payer: Medicare Other

## 2023-06-02 ENCOUNTER — Inpatient Hospital Stay: Payer: Medicare Other | Admitting: Physician Assistant

## 2023-06-03 NOTE — Progress Notes (Signed)
 Remote ICD transmission.

## 2023-06-09 NOTE — Progress Notes (Signed)
 [facility] [address], [place] [address]   CLINIC:  Medical Oncology/Hematology  PCP:  Yevette Hem, FNP [address] 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  She was last evaluated via telemedicine visit by Sheril Dines PA-C on 02/18/2023.  At today's visit, she reports feeling poorly secondary to fatigue and dizziness.  She did not notice any improvement in her symptoms after IV Monoferric .  She does feel a slight improvement in energy after starting B12 injections, but with significant persistent fatigue. Ice pica has resolved after IV iron.  She continues to report headaches and dizziness.  She has intermittent chest pain and dyspnea on exertion attributed to her CHF (following with cardiology).  She has intermittent moderate to severe hemorrhoid bleeding, most recently about 3 weeks ago.  No melena or epistaxis.  She reports about 25% energy, but with little to no appetite.   ASSESSMENT & PLAN:  1.  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  Vitamin B12 low at 133/elevated MMA 1611.  Normal folate and copper .  - Labs from January 2025 showed normal SPEP/immunofixation with normal FLC ratio.  Normal LDH.  Creatinine with baseline CKD stage IIIa/b. - Reports history of blood transfusion 20+ years ago - Unable to tolerate oral iron due to nausea/vomiting - Last EGD was 20+ years ago.  Reports that  she is unable to "go under" for future EGD/colonoscopy due to her CHF. - Most recent IV iron with Monoferric  x 1 g on 03/10/2023 (PREMEDICATION due to multiple medication allergies) - Failed to improve on oral B12.  Monthly B12 injection since January 2025.   - Reports moderate to severe hemorrhoid bleeding occurring about once a month.  Denies melena. - Most recent labs (06/10/2023): Hgb 13.0/MCV 94.6 Iron deficiency: Ferritin 131, iron saturation 39%, TIBC 303 Vitamin B12 low but marginally improved at 331, with MMA pending - Chronic multifactorial fatigue - PLAN: No indication for IV iron at this time. - Continue monthly B12 injections. - Recommended referral to GI for hemorrhoid banding.  Patient declines at this time. - Labs and RTC in 3 months  2.  Social/Family History: - Lives at home with daughter. Independent of ADL's and dependent of IADL's. Quit tobacco use 25 years ago.  - No family history of anemia. Father had stomach cancer.    PLAN SUMMARY:  >> Monthly B12 injections  >> Labs in 6 months = CBC/D, ferritin, iron/TIBC, B12, MMA  >> OFFICE visit in 6 months (1 week after labs)     REVIEW OF SYSTEMS:   Review of Systems  Constitutional:  Positive for appetite change and fatigue. Negative for chills, diaphoresis, fever and unexpected weight change.  HENT:   Negative for lump/mass and nosebleeds.   Eyes:  Negative for eye problems.  Respiratory:  Positive for cough and shortness of breath. Negative for hemoptysis.   Cardiovascular:  Positive for chest pain and palpitations. Negative  for leg swelling.  Gastrointestinal:  Positive for nausea. Negative for abdominal pain, blood in stool, constipation, diarrhea and vomiting.  Genitourinary:  Positive for difficulty urinating. Negative for hematuria.   Musculoskeletal:  Positive for flank pain.  Skin: Negative.   Neurological:  Positive for dizziness and numbness. Negative for headaches and light-headedness.  Hematological:   Does not bruise/bleed easily.  Psychiatric/Behavioral:  Positive for sleep disturbance.      PHYSICAL EXAM:  ECOG PERFORMANCE STATUS: 2 - Symptomatic, <50% confined to bed  Vitals:   06/10/23 1004  BP: 103/62  Pulse: 70  Resp: 20  Temp: 98.3 F (36.8 C)  SpO2: 100%   Filed Weights   06/10/23 1004  Weight: 158 lb 4.6 oz (71.8 kg)   Physical Exam Constitutional:      Appearance: Normal appearance. She is obese.  Cardiovascular:     Heart sounds: Normal heart sounds.  Pulmonary:     Breath sounds: Normal breath sounds.  Neurological:     General: No focal deficit present.     Mental Status: Mental status is at baseline.  Psychiatric:        Behavior: Behavior normal. Behavior is cooperative.    PAST MEDICAL/SURGICAL HISTORY:  Past Medical History:  Diagnosis Date   Anxiety    Chronic systolic heart failure (HCC)    Constipation    Cyst of right kidney 11/12/2017   Glucose intolerance (pre-diabetes)    Hyperlipemia    Hypothyroidism    LBBB (left bundle branch block)    Mitral regurgitation    Mild to Moderate   Nonischemic cardiomyopathy (HCC)    LVEF 35-40% by echo 8/13   Paroxysmal atrial fibrillation (HCC)    Brief episodes by device interrogation   UTI (urinary tract infection)    Recurrent   Ventricular tachycardia (HCC)    ICD therapy for VT   Past Surgical History:  Procedure Laterality Date   APPENDECTOMY     BIV ICD GENERATOR CHANGEOUT N/A 07/11/2016   Procedure: BiV ICD Generator Changeout;  Surgeon: Jolly Needle, MD;  Location: [facility] INVASIVE CV LAB;  Service: Cardiovascular;  Laterality: N/A;   CARDIAC DEFIBRILLATOR PLACEMENT  09/2009   St. Jude BiV ICD by Dr. Andrez Keel class III   CHOLECYSTECTOMY     has one ovary left     KNEE ARTHROSCOPY     Left   LEFT AND RIGHT HEART CATHETERIZATION WITH CORONARY ANGIOGRAM N/A 04/12/2014   Procedure: LEFT AND RIGHT HEART CATHETERIZATION WITH CORONARY ANGIOGRAM;  Surgeon: Arleen Lacer, MD;  Location: [facility]  CATH LAB;  Service: Cardiovascular;  Laterality: N/A;   Left breast biopsy x 2     no maliganancy   PACEMAKER IMPLANT     RIGHT HEART CATH N/A 10/27/2020   Procedure: RIGHT HEART CATH;  Surgeon: Darlis Eisenmenger, MD;  Location: [facility] INVASIVE CV LAB;  Service: Cardiovascular;  Laterality: N/A;   RIGHT/LEFT HEART CATH AND CORONARY ANGIOGRAPHY N/A 03/26/2019   Procedure: RIGHT/LEFT HEART CATH AND CORONARY ANGIOGRAPHY;  Surgeon: Darlis Eisenmenger, MD;  Location: [facility] INVASIVE CV LAB;  Service: Cardiovascular;  Laterality: N/A;   TOTAL ABDOMINAL HYSTERECTOMY     (R) ovary removed    SOCIAL HISTORY:  Social History   Socioeconomic History   Marital status: Widowed    Spouse name: Not on file   Number of children: 2   Years of education: Not on file   Highest education level: Not on file  Occupational History   Not on  file  Tobacco Use   Smoking status: Former    Current packs/day: 0.00    Average packs/day: 0.3 packs/day for 46.5 years (13.9 ttl pk-yrs)    Types: Cigarettes    Start date: 08/03/1949    Quit date: 01/22/1996    Years since quitting: 27.4    Passive exposure: Never   Smokeless tobacco: Never  Vaping Use   Vaping status: Never Used  Substance and Sexual Activity   Alcohol use: No    Alcohol/week: 0.0 standard drinks of alcohol   Drug use: No   Sexual activity: Not Currently    Birth control/protection: Surgical    Comment: hyst  Other Topics Concern   Not on file  Social History Narrative   Her daughter, Odie Benne lives with her - takes her to all visits and cooks for her.   Social Drivers of Corporate investment banker Strain: Low Risk  (09/11/2022)   Overall Financial Resource Strain (CARDIA)    Difficulty of Paying Living Expenses: Not hard at all  Food Insecurity: No Food Insecurity (09/11/2022)   Hunger Vital Sign    Worried About Running Out of Food in the Last Year: Never true    Ran Out of Food in the Last Year: Never true  Transportation Needs: No Transportation  Needs (09/11/2022)   PRAPARE - Administrator, Civil Service (Medical): No    Lack of Transportation (Non-Medical): No  Physical Activity: Inactive (09/11/2022)   Exercise Vital Sign    Days of Exercise per Week: 0 days    Minutes of Exercise per Session: 0 min  Stress: No Stress Concern Present (09/11/2022)   [employer] of Occupational Health - Occupational Stress Questionnaire    Feeling of Stress : Not at all  Social Connections: Moderately Isolated (09/11/2022)   Social Connection and Isolation Panel [NHANES]    Frequency of Communication with Friends and Family: More than three times a week    Frequency of Social Gatherings with Friends and Family: More than three times a week    Attends Religious Services: 1 to 4 times per year    Active Member of [employer] or Organizations: No    Attends Banker Meetings: Never    Marital Status: Widowed  Intimate Partner Violence: Not At Risk (09/11/2022)   Humiliation, Afraid, Rape, and Kick questionnaire    Fear of Current or Ex-Partner: No    Emotionally Abused: No    Physically Abused: No    Sexually Abused: No    FAMILY HISTORY:  Family History  Problem Relation Age of Onset   CVA Mother        multiple   Heart Problems Mother        "weak heart"   Heart disease Maternal Grandmother    Heart disease Maternal Grandfather    Cancer Father        Stomach and eshpogus   Diabetes Neg Hx    Hypertension Neg Hx    Coronary artery disease Neg Hx     CURRENT MEDICATIONS:  Outpatient Encounter Medications as of 06/10/2023  Medication Sig   albuterol  (PROVENTIL ) (2.5 MG/3ML) 0.083% nebulizer solution Take 3 mLs (2.5 mg total) by nebulization every 6 (six) hours as needed for wheezing or shortness of breath.   aspirin  EC 81 MG tablet Take 81 mg by mouth daily. Swallow whole.   blood glucose meter kit and supplies 1 each by Other route 2 (two) times daily as needed for other.  Dispense based on patient and insurance  preference. . (FOR ICD-10 E10.9, E11.9).Dispense based on patient and insurance preference.   carvedilol  (COREG ) 12.5 MG tablet Take 1 tablet (12.5 mg total) by mouth 2 (two) times daily.   dapagliflozin  propanediol (FARXIGA ) 10 MG TABS tablet Take 1 tablet (10 mg total) by mouth daily.   Estradiol  10 MCG TABS vaginal tablet Place 1 tablet (10 mcg total) vaginally 2 (two) times a week.   fosfomycin (MONUROL ) 3 g PACK Take 3 g by mouth once.   furosemide  (LASIX ) 80 MG tablet TAKE 1 TABLET BY MOUTH ONCE DAILY AS NEEDED FOR FLUID   glucose blood (GLUCOSE METER TEST) test strip Use BID   hydrOXYzine  (VISTARIL ) 25 MG capsule Take 1 capsule (25 mg total) by mouth every 8 (eight) hours as needed.   levothyroxine  (SYNTHROID ) 88 MCG tablet TAKE 1 TABLET BY MOUTH ONCE DAILY BEFORE BREAKFAST   metFORMIN  (GLUCOPHAGE -XR) 750 MG 24 hr tablet Take 1 tablet by mouth twice daily   ondansetron  (ZOFRAN -ODT) 4 MG disintegrating tablet Take 1 tablet (4 mg total) by mouth every 8 (eight) hours as needed for nausea or vomiting.   pantoprazole  (PROTONIX ) 40 MG tablet TAKE 1 TABLET BY MOUTH ONCE DAILY BEFORE BREAKFAST (Patient taking differently: Take 40 mg by mouth daily.)   polyethylene glycol (MIRALAX ) 17 g packet Take 17 g by mouth daily. (Patient taking differently: Take 17 g by mouth daily as needed for mild constipation or moderate constipation.)   spironolactone  (ALDACTONE ) 25 MG tablet Take 1 tablet (25 mg total) by mouth daily.   trimethoprim  (TRIMPEX ) 100 MG tablet Take 1 tablet (100 mg total) by mouth daily.   Vibegron  (GEMTESA ) 75 MG TABS Take 1 tablet (75 mg total) by mouth daily.   [DISCONTINUED] apixaban  (ELIQUIS ) 5 MG TABS tablet Take 1 tablet (5 mg total) by mouth 2 (two) times daily.   [EXPIRED] cyanocobalamin  (VITAMIN B12) injection 1,000 mcg    No facility-administered encounter medications on file as of 06/10/2023.    ALLERGIES:  Allergies  Allergen Reactions   Buspar  [Buspirone ] Other (See  Comments)    Patient states that she became hyper and had lots of itching.   Lexapro  [Escitalopram  Oxalate] Other (See Comments)    Patient states that she became hyper and lots of itching   Trulicity [Dulaglutide] Hives and Itching    Tongue was numb.   Bactrim Rash   Cephalexin Swelling and Rash    Says she is able to take cefuroxime  Throat swelling   Chlorhexidine  Gluconate Hives and Itching    Hives, itching after using wipes with last procedure   Clindamycin  Rash        Contrast Media [Iodinated Contrast Media] Rash   Crestor  [Rosuvastatin ] Hives and Rash   Doxycycline Rash   Latex Rash        Levofloxacin Rash        Nitrofurantoin Monohyd Macro Rash        Penicillins Swelling and Rash    Has patient had a PCN reaction causing immediate rash, facial/tongue/throat swelling, SOB or lightheadedness with hypotension: Yes Has patient had a PCN reaction causing severe rash involving mucus membranes or skin necrosis: Yes Has patient had a PCN reaction that required hospitalization: No Has patient had a PCN reaction occurring within the last 10 years: No Rash/swelled tongue If all of the above answers are "NO", then may proceed with Cephalosporin use.     LABORATORY DATA:  I have reviewed the labs as listed.  CBC    Component Value Date/Time   WBC 6.0 06/10/2023 0913   RBC 4.06 06/10/2023 0913   HGB 13.0 06/10/2023 0913   HGB 9.3 (L) 10/22/2022 1616   HCT 38.4 06/10/2023 0913   HCT 30.0 (L) 10/22/2022 1616   PLT 163 06/10/2023 0913   PLT 216 10/22/2022 1616   MCV 94.6 06/10/2023 0913   MCV 77 (L) 10/22/2022 1616   MCH 32.0 06/10/2023 0913   MCHC 33.9 06/10/2023 0913   RDW 13.1 06/10/2023 0913   RDW 16.8 (H) 10/22/2022 1616   LYMPHSABS 2.2 06/10/2023 0913   LYMPHSABS 2.7 10/22/2022 1616   MONOABS 0.2 06/10/2023 0913   EOSABS 0.1 06/10/2023 0913   EOSABS 0.0 10/22/2022 1616   BASOSABS 0.0 06/10/2023 0913   BASOSABS 0.0 10/22/2022 1616      Latest Ref Rng  & Units 04/11/2023   10:20 AM 04/07/2023    2:53 PM 02/11/2023    9:47 AM  CMP  Glucose 70 - 99 mg/dL 161   096   BUN 8 - 27 mg/dL 20   20   Creatinine 0.45 - 1.00 mg/dL 4.09  8.11  9.14   Sodium 134 - 144 mmol/L 141   139   Potassium 3.5 - 5.2 mmol/L 3.6   3.8   Chloride 96 - 106 mmol/L 96   98   CO2 20 - 29 mmol/L 26   29   Calcium  8.7 - 10.3 mg/dL 9.5   9.4   Total Protein 6.0 - 8.5 g/dL 6.7   7.9   Total Bilirubin 0.0 - 1.2 mg/dL 0.9   1.1   Alkaline Phos 44 - 121 IU/L 69   61   AST 0 - 40 IU/L 11   15   ALT 0 - 32 IU/L 6   10     DIAGNOSTIC IMAGING:  I have independently reviewed the relevant imaging and discussed with the patient.   WRAP UP:  All questions were answered. The patient knows to call the clinic with any problems, questions or concerns.  Medical decision making: Moderate  Time spent on visit: I spent 20 minutes counseling the patient face to face. The total time spent in the appointment was 30 minutes and more than 50% was on counseling.  Sonnie Dusky, PA-C  06/10/23 10:57 AM

## 2023-06-10 ENCOUNTER — Inpatient Hospital Stay

## 2023-06-10 ENCOUNTER — Inpatient Hospital Stay: Attending: Physician Assistant

## 2023-06-10 ENCOUNTER — Inpatient Hospital Stay (HOSPITAL_BASED_OUTPATIENT_CLINIC_OR_DEPARTMENT_OTHER): Admitting: Physician Assistant

## 2023-06-10 DIAGNOSIS — R200 Anesthesia of skin: Secondary | ICD-10-CM | POA: Insufficient documentation

## 2023-06-10 DIAGNOSIS — R079 Chest pain, unspecified: Secondary | ICD-10-CM | POA: Insufficient documentation

## 2023-06-10 DIAGNOSIS — F419 Anxiety disorder, unspecified: Secondary | ICD-10-CM | POA: Diagnosis not present

## 2023-06-10 DIAGNOSIS — G479 Sleep disorder, unspecified: Secondary | ICD-10-CM | POA: Insufficient documentation

## 2023-06-10 DIAGNOSIS — K649 Unspecified hemorrhoids: Secondary | ICD-10-CM | POA: Diagnosis not present

## 2023-06-10 DIAGNOSIS — N1831 Chronic kidney disease, stage 3a: Secondary | ICD-10-CM | POA: Diagnosis not present

## 2023-06-10 DIAGNOSIS — Z888 Allergy status to other drugs, medicaments and biological substances status: Secondary | ICD-10-CM | POA: Insufficient documentation

## 2023-06-10 DIAGNOSIS — E039 Hypothyroidism, unspecified: Secondary | ICD-10-CM | POA: Insufficient documentation

## 2023-06-10 DIAGNOSIS — Z8719 Personal history of other diseases of the digestive system: Secondary | ICD-10-CM | POA: Insufficient documentation

## 2023-06-10 DIAGNOSIS — Z91041 Radiographic dye allergy status: Secondary | ICD-10-CM | POA: Insufficient documentation

## 2023-06-10 DIAGNOSIS — R5383 Other fatigue: Secondary | ICD-10-CM | POA: Diagnosis not present

## 2023-06-10 DIAGNOSIS — R42 Dizziness and giddiness: Secondary | ICD-10-CM | POA: Insufficient documentation

## 2023-06-10 DIAGNOSIS — Z7982 Long term (current) use of aspirin: Secondary | ICD-10-CM | POA: Insufficient documentation

## 2023-06-10 DIAGNOSIS — R0602 Shortness of breath: Secondary | ICD-10-CM | POA: Insufficient documentation

## 2023-06-10 DIAGNOSIS — Z881 Allergy status to other antibiotic agents status: Secondary | ICD-10-CM | POA: Insufficient documentation

## 2023-06-10 DIAGNOSIS — E669 Obesity, unspecified: Secondary | ICD-10-CM | POA: Diagnosis not present

## 2023-06-10 DIAGNOSIS — Z800 Family history of malignant neoplasm of digestive organs: Secondary | ICD-10-CM | POA: Insufficient documentation

## 2023-06-10 DIAGNOSIS — E538 Deficiency of other specified B group vitamins: Secondary | ICD-10-CM

## 2023-06-10 DIAGNOSIS — Z90710 Acquired absence of both cervix and uterus: Secondary | ICD-10-CM | POA: Insufficient documentation

## 2023-06-10 DIAGNOSIS — R0609 Other forms of dyspnea: Secondary | ICD-10-CM | POA: Diagnosis not present

## 2023-06-10 DIAGNOSIS — I5022 Chronic systolic (congestive) heart failure: Secondary | ICD-10-CM | POA: Diagnosis not present

## 2023-06-10 DIAGNOSIS — R109 Unspecified abdominal pain: Secondary | ICD-10-CM | POA: Insufficient documentation

## 2023-06-10 DIAGNOSIS — R112 Nausea with vomiting, unspecified: Secondary | ICD-10-CM | POA: Insufficient documentation

## 2023-06-10 DIAGNOSIS — D500 Iron deficiency anemia secondary to blood loss (chronic): Secondary | ICD-10-CM

## 2023-06-10 DIAGNOSIS — R059 Cough, unspecified: Secondary | ICD-10-CM | POA: Insufficient documentation

## 2023-06-10 DIAGNOSIS — Z880 Allergy status to penicillin: Secondary | ICD-10-CM | POA: Insufficient documentation

## 2023-06-10 DIAGNOSIS — Z79899 Other long term (current) drug therapy: Secondary | ICD-10-CM | POA: Insufficient documentation

## 2023-06-10 DIAGNOSIS — Z7901 Long term (current) use of anticoagulants: Secondary | ICD-10-CM | POA: Diagnosis not present

## 2023-06-10 DIAGNOSIS — I480 Paroxysmal atrial fibrillation: Secondary | ICD-10-CM | POA: Insufficient documentation

## 2023-06-10 DIAGNOSIS — Z9049 Acquired absence of other specified parts of digestive tract: Secondary | ICD-10-CM | POA: Insufficient documentation

## 2023-06-10 DIAGNOSIS — R519 Headache, unspecified: Secondary | ICD-10-CM | POA: Insufficient documentation

## 2023-06-10 DIAGNOSIS — D509 Iron deficiency anemia, unspecified: Secondary | ICD-10-CM | POA: Insufficient documentation

## 2023-06-10 DIAGNOSIS — Z90721 Acquired absence of ovaries, unilateral: Secondary | ICD-10-CM | POA: Insufficient documentation

## 2023-06-10 DIAGNOSIS — Z87891 Personal history of nicotine dependence: Secondary | ICD-10-CM | POA: Insufficient documentation

## 2023-06-10 DIAGNOSIS — Z87440 Personal history of urinary (tract) infections: Secondary | ICD-10-CM | POA: Insufficient documentation

## 2023-06-10 DIAGNOSIS — Z8249 Family history of ischemic heart disease and other diseases of the circulatory system: Secondary | ICD-10-CM | POA: Insufficient documentation

## 2023-06-10 DIAGNOSIS — Z823 Family history of stroke: Secondary | ICD-10-CM | POA: Insufficient documentation

## 2023-06-10 LAB — CBC WITH DIFFERENTIAL/PLATELET
Abs Immature Granulocytes: 0 10*3/uL (ref 0.00–0.07)
Basophils Absolute: 0 10*3/uL (ref 0.0–0.1)
Basophils Relative: 0 %
Eosinophils Absolute: 0.1 10*3/uL (ref 0.0–0.5)
Eosinophils Relative: 1 %
HCT: 38.4 % (ref 36.0–46.0)
Hemoglobin: 13 g/dL (ref 12.0–15.0)
Lymphocytes Relative: 37 %
Lymphs Abs: 2.2 10*3/uL (ref 0.7–4.0)
MCH: 32 pg (ref 26.0–34.0)
MCHC: 33.9 g/dL (ref 30.0–36.0)
MCV: 94.6 fL (ref 80.0–100.0)
Monocytes Absolute: 0.2 10*3/uL (ref 0.1–1.0)
Monocytes Relative: 4 %
Neutro Abs: 3.5 10*3/uL (ref 1.7–7.7)
Neutrophils Relative %: 58 %
Platelets: 163 10*3/uL (ref 150–400)
RBC: 4.06 MIL/uL (ref 3.87–5.11)
RDW: 13.1 % (ref 11.5–15.5)
WBC: 6 10*3/uL (ref 4.0–10.5)
nRBC: 0 % (ref 0.0–0.2)

## 2023-06-10 LAB — FERRITIN: Ferritin: 131 ng/mL (ref 11–307)

## 2023-06-10 LAB — IRON AND TIBC
Iron: 117 ug/dL (ref 28–170)
Saturation Ratios: 39 % — ABNORMAL HIGH (ref 10.4–31.8)
TIBC: 303 ug/dL (ref 250–450)
UIBC: 186 ug/dL

## 2023-06-10 LAB — VITAMIN B12: Vitamin B-12: 331 pg/mL (ref 180–914)

## 2023-06-10 MED ORDER — CYANOCOBALAMIN 1000 MCG/ML IJ SOLN
1000.0000 ug | Freq: Once | INTRAMUSCULAR | Status: AC
  Administered 2023-06-10: 1000 ug via INTRAMUSCULAR
  Filled 2023-06-10: qty 1

## 2023-06-10 NOTE — Patient Instructions (Signed)
 [facility] [place] - A DEPT OF [facility]  Discharge Instructions: Thank you for choosing [facility] to provide your oncology and hematology care.  If you have a lab appointment with the Cancer Center - please note that after April 8th, 2024, all labs will be drawn in the cancer center.  You do not have to check in or register with the main entrance as you have in the past but will complete your check-in in the cancer center.  Wear comfortable clothing and clothing appropriate for easy access to any Portacath or PICC line.   We strive to give you quality time with your provider. You may need to reschedule your appointment if you arrive late (15 or more minutes).  Arriving late affects you and other patients whose appointments are after yours.  Also, if you miss three or more appointments without notifying the office, you may be dismissed from the clinic at the provider's discretion.      For prescription refill requests, have your pharmacy contact our office and allow 72 hours for refills to be completed.    Today you received the following B12 injection, return as scheduled.   To help prevent nausea and vomiting after your treatment, we encourage you to take your nausea medication as directed.  BELOW ARE SYMPTOMS THAT SHOULD BE REPORTED IMMEDIATELY: *FEVER GREATER THAN 100.4 F (38 C) OR HIGHER *CHILLS OR SWEATING *NAUSEA AND VOMITING THAT IS NOT CONTROLLED WITH YOUR NAUSEA MEDICATION *UNUSUAL SHORTNESS OF BREATH *UNUSUAL BRUISING OR BLEEDING *URINARY PROBLEMS (pain or burning when urinating, or frequent urination) *BOWEL PROBLEMS (unusual diarrhea, constipation, pain near the anus) TENDERNESS IN MOUTH AND THROAT WITH OR WITHOUT PRESENCE OF ULCERS (sore throat, sores in mouth, or a toothache) UNUSUAL RASH, SWELLING OR PAIN  UNUSUAL VAGINAL DISCHARGE OR ITCHING   Items with * indicate a potential emergency and should be followed up as soon as  possible or go to the Emergency Department if any problems should occur.  Please show the CHEMOTHERAPY ALERT CARD or IMMUNOTHERAPY ALERT CARD at check-in to the Emergency Department and triage nurse.  Should you have questions after your visit or need to cancel or reschedule your appointment, please contact [facility] [place] - A DEPT OF [facility] [phone]  and follow the prompts.  Office hours are 8:00 a.m. to 4:30 p.m. Monday - Friday. Please note that voicemails left after 4:00 p.m. may not be returned until the following business day.  We are closed weekends and major holidays. You have access to a nurse at all times for urgent questions. Please call the main number to the clinic [phone] and follow the prompts.  For any non-urgent questions, you may also contact your provider using MyChart. We now offer e-Visits for anyone 69 and older to request care online for non-urgent symptoms. For details visit [email or web address].   Also download the MyChart app! Go to the app store, search "MyChart", open the app, select [place], and log in with your MyChart username and password.

## 2023-06-10 NOTE — Patient Instructions (Signed)
 [facility] at [facility] **VISIT SUMMARY & IMPORTANT INSTRUCTIONS **   You were seen today by Sheril Dines PA-C for your iron deficiency anemia.   Your blood , iron, and B12 levels are much better! Continue B12 injections once a month. Follow up in 6 months  ** Thank you for trusting me with your healthcare!  I strive to provide all of my patients with quality care at each visit.  If you receive a survey for this visit, I would be so grateful to you for taking the time to provide feedback.  Thank you in advance!  ~ Tran Randle                   Dr. Paulett Boros   &   Sheril Dines, PA-C   - - - - - - - - - - - - - - - - - -    Thank you for choosing [facility] at [facility] to provide your oncology and hematology care.  To afford each patient quality time with our provider, please arrive at least 15 minutes before your scheduled appointment time.   If you have a lab appointment with the Cancer Center please come in thru the Main Entrance and check in at the main information desk.  You need to re-schedule your appointment should you arrive 10 or more minutes late.  We strive to give you quality time with our providers, and arriving late affects you and other patients whose appointments are after yours.  Also, if you no show three or more times for appointments you may be dismissed from the clinic at the providers discretion.     Again, thank you for choosing [facility].  Our hope is that these requests will decrease the amount of time that you wait before being seen by our physicians.       _____________________________________________________________  Should you have questions after your visit to [facility], please contact our office at [phone] and follow the prompts.  Our office hours are 8:00 a.m. and 4:30 p.m. Monday - Friday.  Please note that voicemails left after 4:00 p.m. may not be returned  until the following business day.  We are closed weekends and major holidays.  You do have access to a nurse 24-7, just call the main number to the clinic [phone] and do not press any options, hold on the line and a nurse will answer the phone.    For prescription refill requests, have your pharmacy contact our office and allow 72 hours.

## 2023-06-10 NOTE — Progress Notes (Signed)
Patient tolerated B12 injection with no complaints voiced. Site clean and dry with no bruising or swelling noted at site. See MAR for details. Band aid applied.  Patient stable during and after injection. VSS with discharge and left in satisfactory condition with no s/s of distress noted. 

## 2023-06-14 LAB — METHYLMALONIC ACID, SERUM: Methylmalonic Acid, Quantitative: 1043 nmol/L — ABNORMAL HIGH (ref 0–378)

## 2023-06-23 ENCOUNTER — Ambulatory Visit: Attending: Cardiovascular Disease

## 2023-06-23 DIAGNOSIS — Z95810 Presence of automatic (implantable) cardiac defibrillator: Secondary | ICD-10-CM | POA: Diagnosis not present

## 2023-06-23 DIAGNOSIS — I5022 Chronic systolic (congestive) heart failure: Secondary | ICD-10-CM

## 2023-06-25 NOTE — Progress Notes (Signed)
 EPIC Encounter for ICM Monitoring  Patient Name: Melissa Paul is a 78 y.o. female Date: 06/25/2023 Primary Care Physican: Yevette Hem, FNP Primary Cardiologist: [place] Electrophysiologist: [place] Bi-V Pacing: 91%        01/01/2022 Weight: 178 lbs 03/15/2022 Weight: 170 lbs 05/24/2022 Weight:  162 lbs 06/25/2022  Weight:  165 lbs 07/16/2022 Weight: 170 lbs  01/27/2023 Weight: 163 lbs   02/03/2023 Weight: 165 lbs 03/05/2023 Weight: 163 lbs (up 4 lbs) 06/25/2023 Weight: 152-155 (was 187 lbs)   AT/AF Burden: <1% (taking Eliquis )   Battery Longevity: 4.9 months          Spoke with patient and heart failure questions reviewed.  Transmission results reviewed.  Pt asymptomatic for fluid accumulation.  She has another UTI but other wise doing fine.      CorVue Thoracic impedance suggesting normal fluid levels with possible fluid accumulation from 5/7-5/10 and 5/16-5/20.   Prescribed:  Furosemide  80 mg Take 1 tablet (80 mg total) by mouth daily as needed.  Pt takes extra if needed.   06/25/22 She is taking about twice a week due to her body can't handle Furosemide  daily.   Spirolactone 25 mg take 1 tablet by mouth daily   Labs: 04/11/2023 Creatinine 1.46, BUN 20, Potassium 3.6, Sodium 141, eGFR 36 02/11/2023 Creatinine 1.27, BUN 20, Potassium 3.8, Sodium 139, eGFR 43 12/10/2022 Creatinine 0.97, BUN 13, Potassium 3.5, Sodium 140, eGFR 59 10/22/2022 Creatinine 1.31, BUN 25, Potassium 4.6, Sodium 139 08/13/2022 Creatinine 1.52, BUN 30, Potassium 3.8, Sodium 138, eGFR 35 A complete set of results can be found in Results Review.   Recommendations:   No changes and encouraged to call if experiencing any fluid symptoms.    Follow-up plan: ICM clinic phone appointment on 08/18/2023.   91 day device clinic remote transmission 07/14/2023.    EP/Cardiology Office Visits:  11/28/2023 with Dr Arlester Ladd.  08/06/2023 with Dr Londa Rival.   Aware needs to call Dr Charline Contras office for appointment.  Recall  04/03/2022 with Dr Mitzie Anda (last visit 12/04/2021)   Copy of ICM check sent to Dr. Arlester Ladd.  3 month ICM trend: 06/23/2023.    12-14 Month ICM trend:     Almyra Jain, RN 06/25/2023 10:36 AM

## 2023-07-11 ENCOUNTER — Inpatient Hospital Stay: Attending: Hematology

## 2023-07-11 VITALS — BP 113/52 | HR 80 | Temp 96.7°F | Resp 18

## 2023-07-11 DIAGNOSIS — Z79899 Other long term (current) drug therapy: Secondary | ICD-10-CM | POA: Insufficient documentation

## 2023-07-11 DIAGNOSIS — E538 Deficiency of other specified B group vitamins: Secondary | ICD-10-CM | POA: Diagnosis present

## 2023-07-11 DIAGNOSIS — D509 Iron deficiency anemia, unspecified: Secondary | ICD-10-CM

## 2023-07-11 MED ORDER — CYANOCOBALAMIN 1000 MCG/ML IJ SOLN
1000.0000 ug | Freq: Once | INTRAMUSCULAR | Status: AC
  Administered 2023-07-11: 1000 ug via INTRAMUSCULAR
  Filled 2023-07-11: qty 1

## 2023-07-11 NOTE — Progress Notes (Signed)
 Melissa Paul presents today for B12 injection per the provider's orders.  Stable during administration without incident; injection site WNL; see MAR for injection details.  Patient tolerated procedure well and without incident.  No questions or complaints noted at this time.

## 2023-07-11 NOTE — Patient Instructions (Signed)

## 2023-07-13 ENCOUNTER — Other Ambulatory Visit: Payer: Self-pay | Admitting: Family

## 2023-07-14 ENCOUNTER — Ambulatory Visit (INDEPENDENT_AMBULATORY_CARE_PROVIDER_SITE_OTHER): Payer: Medicare Other

## 2023-07-14 DIAGNOSIS — I5022 Chronic systolic (congestive) heart failure: Secondary | ICD-10-CM

## 2023-07-14 DIAGNOSIS — I428 Other cardiomyopathies: Secondary | ICD-10-CM

## 2023-07-15 LAB — CUP PACEART REMOTE DEVICE CHECK
Battery Remaining Longevity: 4 mo
Battery Remaining Percentage: 4 %
Battery Voltage: 2.62 V
Brady Statistic AP VP Percent: 88 %
Brady Statistic AP VS Percent: 2.7 %
Brady Statistic AS VP Percent: 2.3 %
Brady Statistic AS VS Percent: 4.6 %
Brady Statistic RA Percent Paced: 89 %
Date Time Interrogation Session: 20250623020019
HighPow Impedance: 63 Ohm
HighPow Impedance: 63 Ohm
Implantable Lead Connection Status: 753985
Implantable Lead Connection Status: 753985
Implantable Lead Connection Status: 753985
Implantable Lead Implant Date: 20110912
Implantable Lead Implant Date: 20110912
Implantable Lead Implant Date: 20120710
Implantable Lead Location: 753858
Implantable Lead Location: 753859
Implantable Lead Location: 753860
Implantable Lead Model: 7122
Implantable Pulse Generator Implant Date: 20180621
Lead Channel Impedance Value: 340 Ohm
Lead Channel Impedance Value: 490 Ohm
Lead Channel Impedance Value: 530 Ohm
Lead Channel Pacing Threshold Amplitude: 0.625 V
Lead Channel Pacing Threshold Amplitude: 0.75 V
Lead Channel Pacing Threshold Amplitude: 0.75 V
Lead Channel Pacing Threshold Pulse Width: 0.5 ms
Lead Channel Pacing Threshold Pulse Width: 0.5 ms
Lead Channel Pacing Threshold Pulse Width: 0.5 ms
Lead Channel Sensing Intrinsic Amplitude: 12 mV
Lead Channel Sensing Intrinsic Amplitude: 3 mV
Lead Channel Setting Pacing Amplitude: 2 V
Lead Channel Setting Pacing Amplitude: 2 V
Lead Channel Setting Pacing Amplitude: 2 V
Lead Channel Setting Pacing Pulse Width: 0.5 ms
Lead Channel Setting Pacing Pulse Width: 0.5 ms
Lead Channel Setting Sensing Sensitivity: 0.5 mV
Pulse Gen Serial Number: 9761374
Zone Setting Status: 755011

## 2023-07-23 ENCOUNTER — Ambulatory Visit: Payer: Self-pay | Admitting: Cardiovascular Disease

## 2023-08-05 ENCOUNTER — Ambulatory Visit: Payer: Self-pay

## 2023-08-05 NOTE — Telephone Encounter (Signed)
 FYI Only or Action Required?: Action required by provider: refused ED recommendation due to no transportation and patient refused 911.  Patient was last seen in primary care on 05/19/2023 by Dettinger, Fonda LABOR, MD.  Called Nurse Triage reporting Flank Pain.  Symptoms began a couple of days ago.  Interventions attempted: Rest, hydration, or home remedies.  Symptoms are: unchanged.  Triage Disposition: Go to ED Now (Notify PCP)  Patient/caregiver understands and will follow disposition?: No, refuses disposition  Copied from CRM [id number]. Topic: Clinical - Red Word Triage >> Aug 05, 2023  4:12 PM Delon DASEN wrote: Red Word that prompted transfer to Nurse Triage: Possible UTI, back pain, kidney pain, bladder pain and pressure, low grade fever- pain level 10 Reason for Disposition  [1] SEVERE pain (e.g., excruciating, scale 8-10) AND [2] present > 1 hour  Answer Assessment - Initial Assessment Questions 1. LOCATION: Where does it hurt? (e.g., left, right)     Right side 2. ONSET: When did the pain start?     Started a couple of days ago 3. SEVERITY: How bad is the pain? (e.g., Scale 1-10; mild, moderate, or severe)     10 out of 10 4. PATTERN: Does the pain come and go, or is it constant?      constant 5. CAUSE: What do you think is causing the pain?     Concerned for possible UTI or possible kidney stone 6. OTHER SYMPTOMS:  Do you have any other symptoms? (e.g., fever, abdomen pain, vomiting, leg weakness, burning with urination, blood in urine)     Fever 100.1, back pain, bladder pain and pressure  Patient is concerned with symptoms along with feeling like she can't empty out her bladder completely.  Protocols used: Flank Pain-A-AH

## 2023-08-06 ENCOUNTER — Telehealth: Payer: Self-pay

## 2023-08-06 ENCOUNTER — Other Ambulatory Visit (HOSPITAL_COMMUNITY): Payer: Self-pay

## 2023-08-06 ENCOUNTER — Ambulatory Visit: Admitting: Family Medicine

## 2023-08-06 ENCOUNTER — Ambulatory Visit: Attending: Cardiology | Admitting: Cardiology

## 2023-08-06 ENCOUNTER — Encounter: Payer: Self-pay | Admitting: Cardiology

## 2023-08-06 VITALS — BP 118/68 | HR 70 | Ht 62.5 in | Wt 160.0 lb

## 2023-08-06 DIAGNOSIS — I5020 Unspecified systolic (congestive) heart failure: Secondary | ICD-10-CM | POA: Insufficient documentation

## 2023-08-06 DIAGNOSIS — I480 Paroxysmal atrial fibrillation: Secondary | ICD-10-CM | POA: Diagnosis not present

## 2023-08-06 DIAGNOSIS — I428 Other cardiomyopathies: Secondary | ICD-10-CM | POA: Insufficient documentation

## 2023-08-06 DIAGNOSIS — Z95810 Presence of automatic (implantable) cardiac defibrillator: Secondary | ICD-10-CM | POA: Diagnosis not present

## 2023-08-06 NOTE — Telephone Encounter (Signed)
 Done

## 2023-08-06 NOTE — Telephone Encounter (Signed)
 Application mailed.

## 2023-08-06 NOTE — Telephone Encounter (Signed)
 Donny, do you have a DME order for this? If not please route to [facility]. Thanks.

## 2023-08-06 NOTE — Telephone Encounter (Signed)
-----   Message from [place] sent at 08/06/2023 12:29 PM EDT ----- Regarding: FW: ELIQUIS  ASSISTANCE  ----- Message ----- From: Darrell Bruckner, [facility] Sent: 08/06/2023  11:19 AM EDT To: Rx Prior Auth Team Subject: FW: ELIQUIS  ASSISTANCE                          ----- Message ----- From: Lenon Jinnie HERO, RN Sent: 08/06/2023  10:22 AM EDT To: Cv Div Pharmd Subject: ELIQUIS  ASSISTANCE                             Good morning. This patient says she can not afford eliquis  and need any assistance you can provide.  Thanks

## 2023-08-06 NOTE — Telephone Encounter (Signed)
 Team, can we mail patient a PAP application for Eliquis ?

## 2023-08-06 NOTE — Telephone Encounter (Signed)
 Copied from CRM [id number]. Topic: Clinical - Order For Equipment >> Aug 06, 2023 10:49 AM Delon DASEN wrote: Reason for CRM: need update on order for the walker- [phone] daughter Maurilio

## 2023-08-06 NOTE — Patient Instructions (Addendum)
 Medication Instructions:  Your physician recommends that you continue on your current medications as directed. Please refer to the Current Medication list given to you today. We are checking on the cost of eliquis  for you.  Labwork: none  Testing/Procedures: none  Follow-Up: Your physician recommends that you schedule a follow-up appointment in: 6 months  Any Other Special Instructions Will Be Listed Below (If Applicable).  If you need a refill on your cardiac medications before your next appointment, please call your pharmacy.

## 2023-08-06 NOTE — Progress Notes (Signed)
 Cardiology Office Note  Date: 08/06/2023   ID: Melissa Paul, DOB [birth year], MRN [id number]  History of Present Illness: Melissa Paul is an 78 y.o. female last seen in October 2024.  She is here for a follow-up visit.  She does not report any palpitations, still has occasional episodes of chest discomfort that we suspect are mostly associated with fluid retention based on ischemic workup over time.  She has had more leg edema recently and plans to take an additional Lasix  for the next few days.  Otherwise stable NYHA class II dyspnea.  St. Jude biventricular ICD in place was followed by Dr. Nancey.  Device interrogation in June indicated normal function with brief episodes of AT/AF noted and also atrial lead noise.  No device shocks or syncope.  We went over her medications.  She came off Eliquis  stating that she had difficulty continuing to receive the prescriptions and cost, now taking an aspirin  81 mg daily.  She also came off Farxiga  due to UTIs.  Physical Exam: VS:  BP 118/68   Pulse 70   Ht 5' 2.5 (1.588 m)   Wt 160 lb (72.6 kg)   SpO2 96%   BMI 28.80 kg/m , BMI Body mass index is 28.8 kg/m.  Wt Readings from Last 3 Encounters:  08/06/23 160 lb (72.6 kg)  06/10/23 158 lb 4.6 oz (71.8 kg)  05/19/23 161 lb (73 kg)    General: Patient appears comfortable at rest. HEENT: Conjunctiva and lids normal. Neck: Supple, no elevated JVP or carotid bruits. Lungs: Clear to auscultation, nonlabored breathing at rest. Cardiac: Regular rate and rhythm, no S3 or significant systolic murmur. Extremities: Bilateral ankle edema noted.  ECG:  An ECG dated 11/29/2022 was personally reviewed today and demonstrated:  Dual-chamber pacing.  Labwork: 04/11/2023: ALT 6; AST 11; BUN 20; Creatinine, Ser 1.46; Potassium 3.6; Sodium 141 06/10/2023: Hemoglobin 13.0; Platelets 163     Component Value Date/Time   CHOL 185 04/11/2023 1020   TRIG 183 (H) 04/11/2023 1020   HDL 43 04/11/2023 1020    CHOLHDL 4.3 04/11/2023 1020   CHOLHDL 3.3 04/11/2007 0600   VLDL 10 04/11/2007 0600   LDLCALC 110 (H) 04/11/2023 1020   Other Studies Reviewed Today:  Echocardiogram 10/30/2022:  1. Left ventricular ejection fraction, by estimation, is 30%. The left  ventricle has moderately decreased function. The left ventricle  demonstrates global hypokinesis. The left ventricular internal cavity size  was moderately dilated. Left ventricular  diastolic parameters are consistent with Grade II diastolic dysfunction  (pseudonormalization). Elevated left atrial pressure.   2. Right ventricular systolic function is normal. The right ventricular  size is normal. Tricuspid regurgitation signal is inadequate for assessing  PA pressure.   3. Left atrial size was mildly dilated.   4. The mitral valve is abnormal. Mild mitral valve regurgitation. No  evidence of mitral stenosis.   5. The aortic valve is tricuspid. Aortic valve regurgitation is not  visualized. No aortic stenosis is present.   6. The inferior vena cava is normal in size with greater than 50%  respiratory variability, suggesting right atrial pressure of 3 mmHg.   Assessment and Plan:  1.  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  Did not tolerate Entresto  previously due to lightheadedness and hypotension.  Has had some recent fluid retention and will take an additional Lasix  over her baseline 80 mg daily dose for the next few days.  In the interim  has come off Farxiga  due to UTIs.  Otherwise remains on Coreg  12.5 mg twice daily and Aldactone  25 mg daily.   2.  St. Jude biventricular ICD in place with follow-up with Dr. Nancey.  No device shocks or syncope.   3.  At risk for OSA, she has declined sleep testing.   4.  Paroxysmal atrial fibrillation with CHA2DS2-VASc score of 6.  Plan to check on assistance for Eliquis .  She does not report any palpitations.  Disposition:  Follow up 6  months.  Signed, Jayson JUDITHANN Sierras, M.D., F.A.C.C. [facility]

## 2023-08-07 NOTE — Telephone Encounter (Signed)
 Orer re-printed and given to PCP to sign

## 2023-08-08 ENCOUNTER — Encounter: Payer: Self-pay | Admitting: Family

## 2023-08-08 ENCOUNTER — Ambulatory Visit: Admitting: Family

## 2023-08-08 VITALS — BP 112/71 | HR 76 | Temp 96.4°F | Ht 62.5 in | Wt 159.4 lb

## 2023-08-08 DIAGNOSIS — R399 Unspecified symptoms and signs involving the genitourinary system: Secondary | ICD-10-CM | POA: Diagnosis not present

## 2023-08-08 DIAGNOSIS — M170 Bilateral primary osteoarthritis of knee: Secondary | ICD-10-CM

## 2023-08-08 DIAGNOSIS — N390 Urinary tract infection, site not specified: Secondary | ICD-10-CM

## 2023-08-08 LAB — URINALYSIS, COMPLETE
Bilirubin, UA: NEGATIVE
Glucose, UA: NEGATIVE
Ketones, UA: NEGATIVE
Nitrite, UA: NEGATIVE
Protein,UA: NEGATIVE
Specific Gravity, UA: 1.01 (ref 1.005–1.030)
Urobilinogen, Ur: 0.2 mg/dL (ref 0.2–1.0)
pH, UA: 6 (ref 5.0–7.5)

## 2023-08-08 LAB — MICROSCOPIC EXAMINATION
Renal Epithel, UA: NONE SEEN /HPF
WBC, UA: >30 /HPF — AB (ref 0–5)
Yeast, UA: NONE SEEN

## 2023-08-08 MED ORDER — FOSFOMYCIN TROMETHAMINE 3 G PO PACK: 3.0000 g | PACK | Freq: Once | ORAL | 0 refills | Status: AC

## 2023-08-08 NOTE — Patient Instructions (Signed)

## 2023-08-08 NOTE — Telephone Encounter (Signed)
 Order faxed to [facility] pharmacy at [phone]

## 2023-08-08 NOTE — Progress Notes (Signed)
 Subjective:    Patient ID: Melissa Paul, female    DOB: July 12, 1945, 78 y.o.   MRN: [id number]  Chief Complaint  Patient presents with   Urinary Tract Infection   PT presents to the office today with recurrent UTI for the last week.  Dysuria  This is a recurrent problem. The current episode started 1 to 4 weeks ago. The problem occurs every urination. The problem has been gradually worsening. The quality of the pain is described as burning. The pain is at a severity of 8/10. The pain is moderate. Associated symptoms include flank pain, frequency and urgency. Pertinent negatives include no hematuria, hesitancy, nausea or vomiting. She has tried increased fluids for the symptoms. The treatment provided mild relief.  Knee Pain  The incident occurred more than 1 week ago. There was no injury mechanism. The pain is present in the left knee and right knee. The quality of the pain is described as aching. The pain is at a severity of 10/10. The pain is moderate. The pain has been Intermittent since onset. She reports no foreign bodies present. The symptoms are aggravated by movement and weight bearing. She has tried acetaminophen  and non-weight bearing for the symptoms. The treatment provided mild relief.      Review of Systems  Gastrointestinal:  Negative for nausea and vomiting.  Genitourinary:  Positive for dysuria, flank pain, frequency and urgency. Negative for hematuria and hesitancy.  All other systems reviewed and are negative.   Social History   Socioeconomic History   Marital status: Widowed    Spouse name: Not on file   Number of children: 2   Years of education: Not on file   Highest education level: Not on file  Occupational History   Not on file  Tobacco Use   Smoking status: Former    Current packs/day: 0.00    Average packs/day: 0.3 packs/day for 46.5 years (13.9 ttl pk-yrs)    Types: Cigarettes    Start date: 08/03/1949    Quit date: 01/22/1996    Years since  quitting: 27.5    Passive exposure: Never   Smokeless tobacco: Never  Vaping Use   Vaping status: Never Used  Substance and Sexual Activity   Alcohol use: No    Alcohol/week: 0.0 standard drinks of alcohol   Drug use: No   Sexual activity: Not Currently    Birth control/protection: Surgical    Comment: hyst  Other Topics Concern   Not on file  Social History Narrative   Her daughter, Maurilio lives with her - takes her to all visits and cooks for her.   Social Drivers of Corporate investment banker Strain: Low Risk  (09/11/2022)   Overall Financial Resource Strain (CARDIA)    Difficulty of Paying Living Expenses: Not hard at all  Food Insecurity: No Food Insecurity (09/11/2022)   Hunger Vital Sign    Worried About Running Out of Food in the Last Year: Never true    Ran Out of Food in the Last Year: Never true  Transportation Needs: No Transportation Needs (09/11/2022)   PRAPARE - Administrator, Civil Service (Medical): No    Lack of Transportation (Non-Medical): No  Physical Activity: Inactive (09/11/2022)   Exercise Vital Sign    Days of Exercise per Week: 0 days    Minutes of Exercise per Session: 0 min  Stress: No Stress Concern Present (09/11/2022)   [employer] of Occupational Health - Occupational Stress Questionnaire  Feeling of Stress : Not at all  Social Connections: Moderately Isolated (09/11/2022)   Social Connection and Isolation Panel    Frequency of Communication with Friends and Family: More than three times a week    Frequency of Social Gatherings with Friends and Family: More than three times a week    Attends Religious Services: 1 to 4 times per year    Active Member of [employer] or Organizations: No    Attends Banker Meetings: Never    Marital Status: Widowed   Family History  Problem Relation Age of Onset   CVA Mother        multiple   Heart Problems Mother        weak heart   Heart disease Maternal Grandmother    Heart  disease Maternal Grandfather    Cancer Father        Stomach and eshpogus   Diabetes Neg Hx    Hypertension Neg Hx    Coronary artery disease Neg Hx         Objective:   Physical Exam Vitals reviewed.  Constitutional:      General: She is not in acute distress.    Appearance: She is well-developed.  HENT:     Head: Normocephalic and atraumatic.  Eyes:     Pupils: Pupils are equal, round, and reactive to light.  Neck:     Thyroid : No thyromegaly.  Cardiovascular:     Rate and Rhythm: Normal rate and regular rhythm.     Heart sounds: Normal heart sounds. No murmur heard. Pulmonary:     Effort: Pulmonary effort is normal. No respiratory distress.     Breath sounds: Normal breath sounds. No wheezing.  Abdominal:     General: Bowel sounds are normal. There is no distension.     Palpations: Abdomen is soft.     Tenderness: There is abdominal tenderness.  Musculoskeletal:        General: Tenderness present.     Cervical back: Normal range of motion and neck supple.     Comments: Pain in bilateral knee with flexion   Skin:    General: Skin is warm and dry.  Neurological:     Mental Status: She is alert and oriented to person, place, and time.     Cranial Nerves: No cranial nerve deficit.     Deep Tendon Reflexes: Reflexes are normal and symmetric.  Psychiatric:        Behavior: Behavior normal.        Thought Content: Thought content normal.        Judgment: Judgment normal.       BP 112/71   Pulse 76   Temp (!) 96.4 F (35.8 C)   Ht 5' 2.5 (1.588 m)   Wt 159 lb 6.4 oz (72.3 kg)   SpO2 95%   BMI 28.69 kg/m      Assessment & Plan:  NYIMA VANACKER comes in today with chief complaint of Urinary Tract Infection   Diagnosis and orders addressed:  1. UTI symptoms (Primary) - Urinalysis, Complete - Urine Culture - fosfomycin (MONUROL ) 3 g PACK; Take 3 g by mouth once for 1 dose.  Dispense: 3 g; Refill: 0  2. Recurrent UTI Pt will call Urologists today for  follow up Pt has multiple allergies Fosfomycin today Force fluids AZO over the counter X2 days Culture pending - fosfomycin (MONUROL ) 3 g PACK; Take 3 g by mouth once for 1 dose.  Dispense: 3  g; Refill: 0  3. Bilateral primary osteoarthritis of knee Knee brace given  ROM exercises encouraged    Labs pending Continue current medications  Keep follow up with specialists  Health Maintenance reviewed Diet and exercise encouraged  Return if symptoms worsen or fail to improve.    [name], FNP

## 2023-08-11 ENCOUNTER — Inpatient Hospital Stay: Attending: Hematology

## 2023-08-11 ENCOUNTER — Ambulatory Visit: Payer: Self-pay | Admitting: Family

## 2023-08-11 VITALS — BP 120/98 | HR 93 | Temp 98.3°F | Resp 20

## 2023-08-11 DIAGNOSIS — Z79899 Other long term (current) drug therapy: Secondary | ICD-10-CM | POA: Insufficient documentation

## 2023-08-11 DIAGNOSIS — E538 Deficiency of other specified B group vitamins: Secondary | ICD-10-CM | POA: Diagnosis present

## 2023-08-11 DIAGNOSIS — D509 Iron deficiency anemia, unspecified: Secondary | ICD-10-CM

## 2023-08-11 MED ORDER — CYANOCOBALAMIN 1000 MCG/ML IJ SOLN
1000.0000 ug | Freq: Once | INTRAMUSCULAR | Status: AC
  Administered 2023-08-11: 1000 ug via INTRAMUSCULAR
  Filled 2023-08-11: qty 1

## 2023-08-11 NOTE — Progress Notes (Signed)
 Melissa Paul presents today for injection per the provider's orders.  B12 administration without incident; injection site WNL; see MAR for injection details.  Patient tolerated procedure well and without incident.  No questions or complaints noted at this time.   Discharged from clinic by wheel chair in stable condition. Alert and oriented x 3. F/U with [facility] as scheduled.

## 2023-08-11 NOTE — Patient Instructions (Signed)
 [facility] [place] - A DEPT OF [facility]  Discharge Instructions: Thank you for choosing [facility] to provide your oncology and hematology care.  If you have a lab appointment with the Cancer Center - please note that after April 8th, 2024, all labs will be drawn in the cancer center.  You do not have to check in or register with the main entrance as you have in the past but will complete your check-in in the cancer center.  Wear comfortable clothing and clothing appropriate for easy access to any Portacath or PICC line.   We strive to give you quality time with your provider. You may need to reschedule your appointment if you arrive late (15 or more minutes).  Arriving late affects you and other patients whose appointments are after yours.  Also, if you miss three or more appointments without notifying the office, you may be dismissed from the clinic at the provider's discretion.      For prescription refill requests, have your pharmacy contact our office and allow 72 hours for refills to be completed.    Today you received the following chemotherapy and/or immunotherapy agents Vitamin B12 injection.  Vitamin B12 Injection What is this medication? Vitamin B12 (VAHY tuh min B12) prevents and treats low vitamin B12 levels in your body. It is used in people who do not get enough vitamin B12 from their diet or when their digestive tract does not absorb enough. Vitamin B12 plays an important role in maintaining the health of your nervous system and red blood cells. This medicine may be used for other purposes; ask your health care provider or pharmacist if you have questions. COMMON BRAND NAME(S): B-12 Compliance Kit, B-12 Injection Kit, Cyomin, Dodex, LA-12, Nutri-Twelve, Physicians EZ Use B-12, Primabalt, Vitamin Deficiency Injectable System - B12 What should I tell my care team before I take this medication? They need to know if you have any of these  conditions: Kidney disease Leber's disease Megaloblastic anemia An unusual or allergic reaction to cyanocobalamin, cobalt, other medications, foods, dyes, or preservatives Pregnant or trying to get pregnant Breast-feeding How should I use this medication? This medication is injected into a muscle or deeply under the skin. It is usually given in a clinic or care team's office. However, your care team may teach you how to inject yourself. Follow all instructions. Talk to your care team about the use of this medication in children. Special care may be needed. Overdosage: If you think you have taken too much of this medicine contact a poison control center or emergency room at once. NOTE: This medicine is only for you. Do not share this medicine with others. What if I miss a dose? If you are given your dose at a clinic or care team's office, call to reschedule your appointment. If you give your own injections, and you miss a dose, take it as soon as you can. If it is almost time for your next dose, take only that dose. Do not take double or extra doses. What may interact with this medication? Alcohol Colchicine This list may not describe all possible interactions. Give your health care provider a list of all the medicines, herbs, non-prescription drugs, or dietary supplements you use. Also tell them if you smoke, drink alcohol, or use illegal drugs. Some items may interact with your medicine. What should I watch for while using this medication? Visit your care team regularly. You may need blood work  done while you are taking this medication. You may need to follow a special diet. Talk to your care team. Limit your alcohol intake and avoid smoking to get the best benefit. What side effects may I notice from receiving this medication? Side effects that you should report to your care team as soon as possible: Allergic reactions--skin rash, itching, hives, swelling of the face, lips, tongue, or  throat Swelling of the ankles, hands, or feet Trouble breathing Side effects that usually do not require medical attention (report to your care team if they continue or are bothersome): Diarrhea This list may not describe all possible side effects. Call your doctor for medical advice about side effects. You may report side effects to FDA at 1-800-FDA-1088. Where should I keep my medication? Keep out of the reach of children. Store at room temperature between 15 and 30 degrees C (59 and 85 degrees F). Protect from light. Throw away any unused medication after the expiration date. NOTE: This sheet is a summary. It may not cover all possible information. If you have questions about this medicine, talk to your doctor, pharmacist, or health care provider.  2024 Elsevier/Gold Standard (2020-09-19 00:00:00)      To help prevent nausea and vomiting after your treatment, we encourage you to take your nausea medication as directed.  BELOW ARE SYMPTOMS THAT SHOULD BE REPORTED IMMEDIATELY: *FEVER GREATER THAN 100.4 F (38 C) OR HIGHER *CHILLS OR SWEATING *NAUSEA AND VOMITING THAT IS NOT CONTROLLED WITH YOUR NAUSEA MEDICATION *UNUSUAL SHORTNESS OF BREATH *UNUSUAL BRUISING OR BLEEDING *URINARY PROBLEMS (pain or burning when urinating, or frequent urination) *BOWEL PROBLEMS (unusual diarrhea, constipation, pain near the anus) TENDERNESS IN MOUTH AND THROAT WITH OR WITHOUT PRESENCE OF ULCERS (sore throat, sores in mouth, or a toothache) UNUSUAL RASH, SWELLING OR PAIN  UNUSUAL VAGINAL DISCHARGE OR ITCHING   Items with * indicate a potential emergency and should be followed up as soon as possible or go to the Emergency Department if any problems should occur.  Please show the CHEMOTHERAPY ALERT CARD or IMMUNOTHERAPY ALERT CARD at check-in to the Emergency Department and triage nurse.  Should you have questions after your visit or need to cancel or reschedule your appointment, please contact [facility]  [facility] [place] - A DEPT OF Eligh[facility] Brideg[facility] [phone]  and follow the prompts.  Office hours are 8:00 a.m. to 4:30 p.m. Monday - Friday. Please note that voicemails left after 4:00 p.m. may not be returned until the following business day.  We are closed weekends and major holidays. You have access to a nurse at all times for urgent questions. Please call the main number to the clinic [phone] and follow the prompts.  For any non-urgent questions, you may also contact your provider using MyChart. We now offer e-Visits for anyone 4 and older to request care online for non-urgent symptoms. For details visit [email or web address].   Also download the MyChart app! Go to the app store, search "MyChart", open the app, select [place], and log in with your MyChart username and password.

## 2023-08-13 LAB — URINE CULTURE

## 2023-08-18 ENCOUNTER — Ambulatory Visit: Attending: Cardiovascular Disease

## 2023-08-18 DIAGNOSIS — I5020 Unspecified systolic (congestive) heart failure: Secondary | ICD-10-CM | POA: Diagnosis not present

## 2023-08-18 DIAGNOSIS — Z95810 Presence of automatic (implantable) cardiac defibrillator: Secondary | ICD-10-CM | POA: Diagnosis not present

## 2023-08-19 NOTE — Progress Notes (Unsigned)
 Name: Melissa Paul DOB: 01-28-1945 MRN: [id number]  History of Present Illness: Melissa Paul is a 78 y.o. female who presents today for follow up visit at [facility] [place]. Relevant history includes: 1. Recurrent UTI. - Complicated by patient antibiotic allergies including sulfa, PCN, Keflex, Nitrofurantoin, Levaquin, and Doxycycline.  - No history of stones; KUB on 08/13/2022 was negative. 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 - Was previously on Fosfomycin for suppression every 10 days.  2. OAB with urinary frequency, nocturia, urgency. - Likely exacerbated by diuretic use (Lasix  and Spironolactone ) and glucosuria due to Farxiga . - Failed Gemtesa  75 mg daily.  3. Mixed urinary incontinence (urge predominant). 4. Vaginal atrophy. - Previously used topical vaginal estrogen cream (caused burning sensation). - Currently uses Vagifem  vaginal estradiol  tablets 2x/week.  5. Lichen sclerosus. 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  - Failed prior pessary trial (unable to retain). - Per 06/13/22 pelvic exam by Dr. Watt: G2 apical prolapse without rectocele or significant cystocele. 7. Stable minimally complex bilateral renal cysts.  8. Constipation with chronic suprapubic pain.   Urine culture results in past 12 months: - 11/14/2022: Positive for Klebsiella pneumoniae - 12/10/2022: Positive for Klebsiella pneumoniae - 02/06/2023: Positive for Klebsiella pneumoniae - 03/24/2023: Positive for Klebsiella pneumoniae - 05/19/2023: Positive for Klebsiella pneumoniae - 08/08/2023: Positive for Klebsiella pneumoniae  At last visit on 03/24/2023: - Symptomatic for complex UTI refractory to repeat antibiotic treatment.  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 - Consulted with Dr. Nieves. We agreed on the following recommendations: 1. Urine culture today; will treat as indicated based  on results.  2. Advised patient to start Trimethoprim  100 mg daily to suppress bacteria in bladder. We discussed her listed allergy to Bactrim and that most likely it was the sulfa component which caused a reaction; she stated she will keep Benadryl  on hand when she starts taking Trimethoprim  just in case an allergic reaction occurs.  3. CT abdomen/pelvis with oral and IV contrast and cystoscopy for further evaluation. Pre-treatment medications (Prednisone  and Benadryl ) ordered to prevent allergic reaction to contrast dye.  4. Diflucan  150 mg once for yeast on UA. Take second dose 3 days later. 5. Zofran  4 mg sublingual every 8 hours PRN for nausea. 6. Continue Gemtesa  (Vibegron ) 75 mg daily.  7. Continue Vagifem  vaginal estradiol  tablets twice per week.   Since last visit: >Treated with IM Gentamicin  80 mg x3 days starting on 04/02/2023.  > 04/07/2023:  CT abdomen/pelvis w/wo contrast showed:  - Bladder is within normal limits. No findings to suggest bowel-bladder fistula on CT. - Bilateral renal cysts, benign (Bosniak I-II). No follow-up is recommended.  > 05/19/2023:  - Seen by PCP for UTI symptoms (dysuria and frequency).  - Urine culture positive for Klebsiella pneumoniae. - Treated with Fosfomycin.  > 08/08/2023:  - Seen by PCP for UTI symptoms (dysuria, flank pain, frequency, and urgency).  - Urine culture positive for Klebsiella pneumoniae. - Treated with Fosfomycin.  Today: She denies dysuria, gross hematuria, straining to void, or sensations of incomplete emptying.   Denies fecaluria or pneumaturia. She reports midline lower abdominal pain. Has chronic constipation and nausea.  She discontinued Gemtesa  about 6 weeks ago - states it made no significant difference in terms of her urinary frequency, nocturia, urgency, and urge incontinence. She continues to leak multiple times per day.   She also reports that she stopped taking Trimethoprim  100 mg daily for  UTI prophylaxis about 7  weeks ago because it wasn't helping.   Medications: Current Outpatient Medications  Medication Sig Dispense Refill   albuterol  (PROVENTIL ) (2.5 MG/3ML) 0.083% nebulizer solution Take 3 mLs (2.5 mg total) by nebulization every 6 (six) hours as needed for wheezing or shortness of breath. 150 mL 1   aspirin  EC 81 MG tablet Take 81 mg by mouth daily. Swallow whole.     blood glucose meter kit and supplies 1 each by Other route 2 (two) times daily as needed for other. Dispense based on patient and insurance preference. . (FOR ICD-10 E10.9, E11.9).Dispense based on patient and insurance preference. 1 each 0   carvedilol  (COREG ) 12.5 MG tablet Take 1 tablet (12.5 mg total) by mouth 2 (two) times daily. 60 tablet 1   Estradiol  10 MCG TABS vaginal tablet Place 1 tablet (10 mcg total) vaginally 2 (two) times a week. 8 tablet 11   furosemide  (LASIX ) 80 MG tablet Take 1 tablet (80 mg total) by mouth daily as needed. For fluid 90 tablet 0   glucose blood (GLUCOSE METER TEST) test strip Use BID 100 each 12   hydrOXYzine  (VISTARIL ) 25 MG capsule Take 1 capsule (25 mg total) by mouth every 8 (eight) hours as needed. 60 capsule 2   levothyroxine  (SYNTHROID ) 88 MCG tablet Take 1 tablet (88 mcg total) by mouth daily before breakfast. 30 tablet 1   metFORMIN  (GLUCOPHAGE -XR) 750 MG 24 hr tablet Take 1 tablet (750 mg total) by mouth 2 (two) times daily. **NEEDS TO BE SEEN BEFORE NEXT REFILL** 60 tablet 0   ondansetron  (ZOFRAN -ODT) 4 MG disintegrating tablet Take 1 tablet (4 mg total) by mouth every 8 (eight) hours as needed for nausea or vomiting. 20 tablet 1   pantoprazole  (PROTONIX ) 40 MG tablet Take 1 tablet (40 mg total) by mouth daily before breakfast. 30 tablet 1   polyethylene glycol (MIRALAX ) 17 g packet Take 17 g by mouth daily. (Patient taking differently: Take 17 g by mouth daily as needed for mild constipation or moderate constipation.) 14 each 0   spironolactone  (ALDACTONE ) 25 MG tablet Take 1 tablet (25  mg total) by mouth daily. **NEEDS TO BE SEEN BEFORE NEXT REFILL** 30 tablet 0   fosfomycin (MONUROL ) 3 g PACK Take one by mouth every 10 days for UTI suppression. 12 each 3   No current facility-administered medications for this visit.    Allergies: Allergies  Allergen Reactions   Buspar  [Buspirone ] Other (See Comments)    Patient states that she became hyper and had lots of itching.   Lexapro  [Escitalopram  Oxalate] Other (See Comments)    Patient states that she became hyper and lots of itching   Trulicity [Dulaglutide] Hives and Itching    Tongue was numb.   Bactrim Rash   Cephalexin Swelling and Rash    Says she is able to take cefuroxime  Throat swelling   Chlorhexidine  Gluconate Hives and Itching    Hives, itching after using wipes with last procedure   Clindamycin  Rash        Contrast Media [Iodinated Contrast Media] Rash   Crestor  [Rosuvastatin ] Hives and Rash   Doxycycline Rash   Latex Rash        Levofloxacin Rash        Nitrofurantoin Monohyd Macro Rash        Penicillins Swelling and Rash    Has patient had a PCN reaction causing immediate rash, facial/tongue/throat swelling, SOB or lightheadedness with hypotension: Yes Has patient had  a PCN reaction causing severe rash involving mucus membranes or skin necrosis: Yes Has patient had a PCN reaction that required hospitalization: No Has patient had a PCN reaction occurring within the last 10 years: No Rash/swelled tongue If all of the above answers are NO, then may proceed with Cephalosporin use.     Past Medical History:  Diagnosis Date   Anxiety    Chronic systolic heart failure (HCC)    Constipation    Cyst of right kidney 11/12/2017   Glucose intolerance (pre-diabetes)    Hyperlipemia    Hypothyroidism    LBBB (left bundle branch block)    Mitral regurgitation    Mild to Moderate   Nonischemic cardiomyopathy (HCC)    LVEF 35-40% by echo 8/13   Paroxysmal atrial fibrillation (HCC)    Brief  episodes by device interrogation   UTI (urinary tract infection)    Recurrent   Ventricular tachycardia (HCC)    ICD therapy for VT   Past Surgical History:  Procedure Laterality Date   APPENDECTOMY     BIV ICD GENERATOR CHANGEOUT N/A 07/11/2016   Procedure: BiV ICD Generator Changeout;  Surgeon: Kelsie Agent, MD;  Location: [facility] INVASIVE CV LAB;  Service: Cardiovascular;  Laterality: N/A;   CARDIAC DEFIBRILLATOR PLACEMENT  09/2009   [employer] Jude BiV ICD by Dr. Darleen class III   CHOLECYSTECTOMY     has one ovary left     KNEE ARTHROSCOPY     Left   LEFT AND RIGHT HEART CATHETERIZATION WITH CORONARY ANGIOGRAM N/A 04/12/2014   Procedure: LEFT AND RIGHT HEART CATHETERIZATION WITH CORONARY ANGIOGRAM;  Surgeon: Alm LELON Clay, MD;  Location: [facility] CATH LAB;  Service: Cardiovascular;  Laterality: N/A;   Left breast biopsy x 2     no maliganancy   PACEMAKER IMPLANT     RIGHT HEART CATH N/A 10/27/2020   Procedure: RIGHT HEART CATH;  Surgeon: Rolan Ezra RAMAN, MD;  Location: [facility] INVASIVE CV LAB;  Service: Cardiovascular;  Laterality: N/A;   RIGHT/LEFT HEART CATH AND CORONARY ANGIOGRAPHY N/A 03/26/2019   Procedure: RIGHT/LEFT HEART CATH AND CORONARY ANGIOGRAPHY;  Surgeon: Rolan Ezra RAMAN, MD;  Location: [facility] INVASIVE CV LAB;  Service: Cardiovascular;  Laterality: N/A;   TOTAL ABDOMINAL HYSTERECTOMY     (R) ovary removed   Family History  Problem Relation Age of Onset   CVA Mother        multiple   Heart Problems Mother        weak heart   Heart disease Maternal Grandmother    Heart disease Maternal Grandfather    Cancer Father        Stomach and eshpogus   Diabetes Neg Hx    Hypertension Neg Hx    Coronary artery disease Neg Hx    Social History   Socioeconomic History   Marital status: Widowed    Spouse name: Not on file   Number of children: 2   Years of education: Not on file   Highest education level: Not on file  Occupational History   Not on file  Tobacco Use   Smoking  status: Former    Current packs/day: 0.00    Average packs/day: 0.3 packs/day for 46.5 years (13.9 ttl pk-yrs)    Types: Cigarettes    Start date: 08/03/1949    Quit date: 01/22/1996    Years since quitting: 27.5    Passive exposure: Never   Smokeless tobacco: Never  Vaping Use   Vaping status: Never Used  Substance and Sexual Activity   Alcohol use: No    Alcohol/week: 0.0 standard drinks of alcohol   Drug use: No   Sexual activity: Not Currently    Birth control/protection: Surgical    Comment: hyst  Other Topics Concern   Not on file  Social History Narrative   Her daughter, Maurilio lives with her - takes her to all visits and cooks for her.   Social Drivers of Corporate investment banker Strain: Low Risk  (09/11/2022)   Overall Financial Resource Strain (CARDIA)    Difficulty of Paying Living Expenses: Not hard at all  Food Insecurity: No Food Insecurity (09/11/2022)   Hunger Vital Sign    Worried About Running Out of Food in the Last Year: Never true    Ran Out of Food in the Last Year: Never true  Transportation Needs: No Transportation Needs (09/11/2022)   PRAPARE - Administrator, Civil Service (Medical): No    Lack of Transportation (Non-Medical): No  Physical Activity: Inactive (09/11/2022)   Exercise Vital Sign    Days of Exercise per Week: 0 days    Minutes of Exercise per Session: 0 min  Stress: No Stress Concern Present (09/11/2022)   [employer] of Occupational Health - Occupational Stress Questionnaire    Feeling of Stress : Not at all  Social Connections: Moderately Isolated (09/11/2022)   Social Connection and Isolation Panel    Frequency of Communication with Friends and Family: More than three times a week    Frequency of Social Gatherings with Friends and Family: More than three times a week    Attends Religious Services: 1 to 4 times per year    Active Member of [employer] or Organizations: No    Attends Banker Meetings: Never     Marital Status: Widowed  Intimate Partner Violence: Not At Risk (09/11/2022)   Humiliation, Afraid, Rape, and Kick questionnaire    Fear of Current or Ex-Partner: No    Emotionally Abused: No    Physically Abused: No    Sexually Abused: No    Review of Systems Constitutional: Patient denies any unintentional weight loss or change in strength lntegumentary: Patient denies any rashes or pruritus Cardiovascular: Patient denies chest pain or syncope Respiratory: Patient denies shortness of breath Gastrointestinal: As per HPI Musculoskeletal: Patient denies muscle cramps or weakness Neurologic: Patient denies convulsions or seizures Allergic/Immunologic: Patient denies recent allergic reaction(s) Hematologic/Lymphatic: Patient denies bleeding tendencies Endocrine: Patient denies heat/cold intolerance  GU: As per HPI.  OBJECTIVE Vitals:   08/20/23 1037  BP: 105/66  Pulse: 70   There is no height or weight on file to calculate BMI.  Physical Examination Constitutional: No obvious distress; patient is non-toxic appearing  Cardiovascular: No visible lower extremity edema.  Respiratory: The patient does not have audible wheezing/stridor; respirations do not appear labored  Gastrointestinal: Abdomen non-distended Musculoskeletal: Normal ROM of UEs  Skin: No obvious rashes/open sores  Neurologic: CN 2-12 grossly intact Psychiatric: Answered questions appropriately with normal affect  Hematologic/Lymphatic/Immunologic: No obvious bruises or sites of spontaneous bleeding  Urine microscopy: 6-10 WBC/hpf, 0-2 RBC/hpf, many bacteria PVR: 1 ml  ASSESSMENT Recurrent UTI - Plan: Urinalysis, Routine w reflex microscopic, fosfomycin (MONUROL ) 3 g PACK, Urine Culture  OAB (overactive bladder) - Plan: BLADDER SCAN AMB NON-IMAGING  Atrophic vaginitis  Lichen sclerosus et atrophicus  Mixed stress and urge urinary incontinence  Constipation, unspecified constipation type  We reviewed  history in detail. Advised to reschedule cystoscopy  for further evaluation due to ongoing refractory UTIs despite being on prophylaxis. We agreed to switch back to Fosfomycin 3 grams once every 10 days. Advised to continue Vagifem  vaginal estradiol  tablets twice per week. Also advised f/u with GI for chronic constipation. Patient verbalized understanding of and agreement with current plan. All questions were answered.  PLAN Advised the following: 1. Urine culture. 2. Fosfomycin 3 grams once every 10 days.  3. Return for f/u with MD for cystoscopy / due to complexity.  Orders Placed This Encounter  Procedures   Urine Culture   Urinalysis, Routine w reflex microscopic   BLADDER SCAN AMB NON-IMAGING    It has been explained that the patient is to follow regularly with their PCP in addition to all other providers involved in their care and to follow instructions provided by these respective offices. Patient advised to contact urology clinic if any urologic-pertaining questions, concerns, new symptoms or problems arise in the interim period.  There are no Patient Instructions on file for this visit.  Electronically signed by:  Lauraine JAYSON Oz, FNP   08/20/23    11:51 AM

## 2023-08-20 ENCOUNTER — Encounter: Payer: Self-pay | Admitting: Urology

## 2023-08-20 ENCOUNTER — Other Ambulatory Visit: Payer: Self-pay | Admitting: Family

## 2023-08-20 ENCOUNTER — Ambulatory Visit (INDEPENDENT_AMBULATORY_CARE_PROVIDER_SITE_OTHER): Admitting: Urology

## 2023-08-20 VITALS — BP 105/66 | HR 70

## 2023-08-20 DIAGNOSIS — N3946 Mixed incontinence: Secondary | ICD-10-CM

## 2023-08-20 DIAGNOSIS — N3281 Overactive bladder: Secondary | ICD-10-CM | POA: Diagnosis not present

## 2023-08-20 DIAGNOSIS — N390 Urinary tract infection, site not specified: Secondary | ICD-10-CM

## 2023-08-20 DIAGNOSIS — Z87440 Personal history of urinary (tract) infections: Secondary | ICD-10-CM

## 2023-08-20 DIAGNOSIS — L900 Lichen sclerosus et atrophicus: Secondary | ICD-10-CM | POA: Diagnosis not present

## 2023-08-20 DIAGNOSIS — N952 Postmenopausal atrophic vaginitis: Secondary | ICD-10-CM | POA: Diagnosis not present

## 2023-08-20 DIAGNOSIS — K5900 Constipation, unspecified: Secondary | ICD-10-CM

## 2023-08-20 LAB — URINALYSIS, ROUTINE W REFLEX MICROSCOPIC
Bilirubin, UA: NEGATIVE
Glucose, UA: NEGATIVE
Ketones, UA: NEGATIVE
Nitrite, UA: POSITIVE — AB
Protein,UA: NEGATIVE
RBC, UA: NEGATIVE
Specific Gravity, UA: 1.01 (ref 1.005–1.030)
Urobilinogen, Ur: 0.2 mg/dL (ref 0.2–1.0)
pH, UA: 6 (ref 5.0–7.5)

## 2023-08-20 LAB — MICROSCOPIC EXAMINATION

## 2023-08-20 LAB — BLADDER SCAN AMB NON-IMAGING: Scan Result: 1

## 2023-08-20 MED ORDER — LEVOTHYROXINE SODIUM 88 MCG PO TABS: 88.0000 ug | ORAL_TABLET | Freq: Every day | ORAL | 1 refills | Status: DC

## 2023-08-20 MED ORDER — CARVEDILOL 12.5 MG PO TABS: 12.5000 mg | ORAL_TABLET | Freq: Two times a day (BID) | ORAL | 1 refills | Status: DC

## 2023-08-20 MED ORDER — FOSFOMYCIN TROMETHAMINE 3 G PO PACK: PACK | ORAL | 3 refills | Status: DC

## 2023-08-20 MED ORDER — FUROSEMIDE 80 MG PO TABS: 80.0000 mg | ORAL_TABLET | Freq: Every day | ORAL | 0 refills | Status: DC | PRN

## 2023-08-20 MED ORDER — PANTOPRAZOLE SODIUM 40 MG PO TBEC: 40.0000 mg | DELAYED_RELEASE_TABLET | Freq: Every day | ORAL | 1 refills | Status: DC

## 2023-08-20 NOTE — Telephone Encounter (Signed)
 Christy pt NTBS 30-d given 07/14/23

## 2023-08-20 NOTE — Addendum Note (Signed)
 Addended by: Ainslee Sou D on: 08/20/2023 10:10 AM   Modules accepted: Orders

## 2023-08-20 NOTE — Telephone Encounter (Signed)
 Appt scheduled for 09/25/2023, please send refill to pharmacy

## 2023-08-20 NOTE — Progress Notes (Signed)
 Remote ICD transmission.

## 2023-08-20 NOTE — Addendum Note (Signed)
 Addended by: TAWNI DRILLING D on: 08/20/2023 01:18 PM   Modules accepted: Orders

## 2023-08-21 NOTE — Progress Notes (Signed)
 EPIC Encounter for ICM Monitoring  Patient Name: Melissa Paul is a 78 y.o. female Date: 08/21/2023 Primary Care Physican: Lavell Bari LABOR, FNP Primary Cardiologist: [place] Electrophysiologist: [place] Bi-V Pacing: 91%        01/01/2022 Weight: 178 lbs 01/27/2023 Weight: 163 lbs   02/03/2023 Weight: 165 lbs 03/05/2023 Weight: 163 lbs (up 4 lbs) 06/25/2023 Weight: 152-155 (was 187 lbs)   AT/AF Burden: <1% (taking Eliquis )   Battery Longevity: 3.7 months          Spoke with patient and heart failure questions reviewed.  Transmission results reviewed.  Pt asymptomatic for fluid accumulation.      CorVue Thoracic impedance suggesting intermittent days with possible fluid accumulation within the last month.   Prescribed:  Furosemide  80 mg Take 1 tablet (80 mg total) by mouth daily as needed.  Pt takes extra if needed.   06/25/22 She is taking about twice a week due to her body can't handle Furosemide  daily.   Spirolactone 25 mg take 1 tablet by mouth daily   Labs: 04/11/2023 Creatinine 1.46, BUN 20, Potassium 3.6, Sodium 141, eGFR 36 02/11/2023 Creatinine 1.27, BUN 20, Potassium 3.8, Sodium 139, eGFR 43 12/10/2022 Creatinine 0.97, BUN 13, Potassium 3.5, Sodium 140, eGFR 59 10/22/2022 Creatinine 1.31, BUN 25, Potassium 4.6, Sodium 139 08/13/2022 Creatinine 1.52, BUN 30, Potassium 3.8, Sodium 138, eGFR 35 A complete set of results can be found in Results Review.   Recommendations:   No changes and encouraged to call if experiencing any fluid symptoms.    Follow-up plan: ICM clinic phone appointment on 09/23/2023.   91 day device clinic remote transmission 10/13/2023.    EP/Cardiology Office Visits:  11/28/2023 with Dr Nancey.  Recall 02/02/2024 with Dr Debera.   Aware needs to call Dr Orvilla office for appointment.  Recall 04/03/2022 with Dr Rolan (last visit 12/04/2021)   Copy of ICM check sent to Dr. Nancey.   3 month ICM trend: 08/18/2023.    12-14 Month ICM trend:      Melissa GORMAN Garner, RN 08/21/2023 4:52 PM

## 2023-08-25 ENCOUNTER — Ambulatory Visit: Payer: Self-pay | Admitting: Urology

## 2023-08-25 LAB — URINE CULTURE

## 2023-08-25 NOTE — Telephone Encounter (Signed)
 Patient was made aware and voiced understanding.

## 2023-08-25 NOTE — Telephone Encounter (Signed)
-----   Message from Lauraine JAYSON Oz sent at 08/25/2023 10:47 AM EDT ----- Please let patient know urine culture was positive and the antibiotic which was prescribed (Fosfomycin) should be effective based on culture sensitivities.  ----- Message ----- From: Rebecka Memos Lab Results In Sent: 08/20/2023   3:36 PM EDT To: Sarah C Larocco, FNP

## 2023-08-28 NOTE — Telephone Encounter (Signed)
 Paperwork on fax, reviewing.

## 2023-08-29 ENCOUNTER — Encounter: Payer: Self-pay | Admitting: Adult Health

## 2023-08-29 ENCOUNTER — Ambulatory Visit (INDEPENDENT_AMBULATORY_CARE_PROVIDER_SITE_OTHER): Admitting: Adult Health

## 2023-08-29 VITALS — BP 102/64 | HR 75 | Ht 62.5 in | Wt 163.0 lb

## 2023-08-29 DIAGNOSIS — Z90710 Acquired absence of both cervix and uterus: Secondary | ICD-10-CM | POA: Diagnosis not present

## 2023-08-29 DIAGNOSIS — R102 Pelvic and perineal pain: Secondary | ICD-10-CM

## 2023-08-29 DIAGNOSIS — N993 Prolapse of vaginal vault after hysterectomy: Secondary | ICD-10-CM | POA: Diagnosis not present

## 2023-08-29 DIAGNOSIS — Z87440 Personal history of urinary (tract) infections: Secondary | ICD-10-CM

## 2023-08-29 NOTE — Telephone Encounter (Signed)
 Please have provider complete their portion of patient's PAP application (see chart media). Please fax the completed signed and dated form to [phone].

## 2023-08-29 NOTE — Progress Notes (Signed)
  Subjective:     Patient ID: Melissa Paul, female   DOB: July 02, 1945, 78 y.o.   MRN: [id number]  HPI Kabao is a 78 year old white female, widowed, sp hysterectomy in complaining of vaginal pain, throbbing, worse in last week, hurts to sit.  PCP is JAYSON Learn, NP  Review of Systems +vaginal pain , throbbing, worse in last week, hurts to sit  History of UTI, has one now  Reviewed past medical,surgical, social and family history. Reviewed medications and allergies.     Objective:   Physical Exam BP 102/64 (BP Location: Right Arm, Patient Position: Sitting, Cuff Size: Normal)   Pulse 75   Ht 5' 2.5 (1.588 m)   Wt 163 lb (73.9 kg)   BMI 29.34 kg/m     Skin warm and dry.Pelvic: external genitalia is normal in appearance no lesions, vagina: pale, tender on exam, +prolapse,urethra has no lesions or masses noted, cervix and uterus are absent, adnexa: no masses or tenderness noted. Bladder is non tender and no masses felt.  Upstream - 08/29/23 1101       Pregnancy Intention Screening   Does the patient want to become pregnant in the next year? N/A    Does the patient's partner want to become pregnant in the next year? N/A    Would the patient like to discuss contraceptive options today? N/A      Contraception Wrap Up   Current Method Female Sterilization   hyst   End Method Female Sterilization   hyst   Contraception Counseling Provided No         Examination chaperoned by Clarita Salt LPN  Assessment:     1. Vaginal vault prolapse after hysterectomy +prolapse has tried 3 different pessary and they fell out   2. Vaginal pain (Primary) Has throbbing pain, worse in last week, is using vaginal estrogen from urologist  She is tender, no discharge, or odor, could be related to UTI    3. History of UTI On meds now and having scope soon to look in bladder to see why having UTIs has had 8 this year    Plan:     Follow up prn

## 2023-09-03 NOTE — Telephone Encounter (Signed)
 Received, thank you

## 2023-09-03 NOTE — Telephone Encounter (Signed)
 PAP: Application for Eliquis has been submitted to [employer] ([employer]), via fax If patient requests an update in the meantime, please refer them to [employer] at [phone]

## 2023-09-11 ENCOUNTER — Inpatient Hospital Stay

## 2023-09-12 ENCOUNTER — Ambulatory Visit: Payer: Medicare Other

## 2023-09-12 VITALS — BP 96/72 | HR 77 | Ht 62.0 in | Wt 166.0 lb

## 2023-09-12 DIAGNOSIS — Z0000 Encounter for general adult medical examination without abnormal findings: Secondary | ICD-10-CM

## 2023-09-12 NOTE — Patient Instructions (Addendum)
 Melissa Paul , Thank you for taking time out of your busy schedule to complete your Annual Wellness Visit with me. I enjoyed our conversation and look forward to speaking with you again next year. I, as well as your care team,  appreciate your ongoing commitment to your health goals. Please review the following plan we discussed and let me know if I can assist you in the future. Your Game plan/ To Do List    Referrals: If you haven't heard from the office you've been referred to, please reach out to them at the phone provided.   Follow up Visits: We will see or speak with you next year for your Next Medicare AWV with our clinical staff on 09/14/24 at 9:20a.m. Have you seen your provider in the last 6 months (3 months if uncontrolled diabetes)? Yes  Clinician Recommendations:  Aim for 30 minutes of exercise or brisk walking, 6-8 glasses of water, and 5 servings of fruits and vegetables each day.        This is a list of the screenings recommended for you:  Health Maintenance  Topic Date Due   COVID-19 Vaccine (1) Never done   Zoster (Shingles) Vaccine (1 of 2) Never done   Pneumococcal Vaccine for age over 26 (2 of 2 - PCV) 07/12/2010   Eye exam for diabetics  07/05/2022   Medicare Annual Wellness Visit  09/11/2023   Flu Shot  08/22/2023   DEXA scan (bone density measurement)  04/10/2024*   Hemoglobin A1C  10/12/2023   Complete foot exam   10/22/2023   Mammogram  11/20/2023   Yearly kidney function blood test for diabetes  04/10/2024   Yearly kidney health urinalysis for diabetes  04/13/2024   HPV Vaccine  Aged Out   Meningitis B Vaccine  Aged Out   DTaP/Tdap/Td vaccine  Discontinued   Hepatitis C Screening  Discontinued  *Topic was postponed. The date shown is not the original due date.    Advanced directives: (Copy Requested) Please bring a copy of your health care power of attorney and living will to the office to be added to your chart at your convenience. You can mail to [facility] [address] [place], [place] [address] or email to [email or web address] Advance Care Planning is important because it:  [x]  Makes sure you receive the medical care that is consistent with your values, goals, and preferences  [x]  It provides guidance to your family and loved ones and reduces their decisional burden about whether or not they are making the right decisions based on your wishes.  Follow the link provided in your after visit summary or read over the paperwork we have mailed to you to help you started getting your Advance Directives in place. If you need assistance in completing these, please reach out to us  so that we can help you!  See attachments for Preventive Care and Fall Prevention Tips.

## 2023-09-12 NOTE — Progress Notes (Signed)
 Subjective:   Melissa Paul is a 78 y.o. who presents for a Medicare Wellness preventive visit.  As a reminder, Annual Wellness Visits don't include a physical exam, and some assessments may be limited, especially if this visit is performed virtually. We may recommend an in-person follow-up visit with your provider if needed.  Visit Complete: Virtual I connected with  Melissa Paul on 09/12/23 by a audio enabled telemedicine application and verified that I am speaking with the correct person using two identifiers.  Patient Location: Home  Provider Location: Home Office  I discussed the limitations of evaluation and management by telemedicine. The patient expressed understanding and agreed to proceed.  Vital Signs: Because this visit was a virtual/telehealth visit, some criteria may be missing or patient reported. Any vitals not documented were not able to be obtained and vitals that have been documented are patient reported.  VideoDeclined- This patient declined Librarian, academic. Therefore the visit was completed with audio only.  Persons Participating in Visit: Patient.  AWV Questionnaire: No: Patient Medicare AWV questionnaire was not completed prior to this visit.  Cardiac Risk Factors include: advanced age (>71men, >40 women);diabetes mellitus;smoking/ tobacco exposure     Objective:    Today's Vitals   09/12/23 0921 09/12/23 0923  BP: 102/64   Pulse: 75   Weight: 163 lb (73.9 kg)   Height: 5' 2 (1.575 m)   PainSc:  10-Worst pain ever   Body mass index is 29.81 kg/m.     09/12/2023    9:29 AM 08/11/2023   11:44 AM 07/11/2023   11:06 AM 06/10/2023   10:11 AM 02/18/2023    8:16 AM 12/23/2022   10:41 AM 12/10/2022    1:36 PM  Advanced Directives  Does Patient Have a Medical Advance Directive? Yes Yes Yes Yes Yes Yes No  Type of Estate agent of [place];Living will Living will Living will;Healthcare Power of  Attorney Living will;Healthcare Power of [employer] Power of [place];Living will Healthcare Power of [place];Living will   Does patient want to make changes to medical advance directive?  No - Patient declined  No - Patient declined No - Patient declined    Copy of Healthcare Power of Attorney in Chart? No - copy requested No - copy requested No - copy requested No - copy requested No - copy requested No - copy requested   Would patient like information on creating a medical advance directive?  No - Patient declined No - Patient declined No - Patient declined No - Patient declined  No - Patient declined    Current Medications (verified) Outpatient Encounter Medications as of 09/12/2023  Medication Sig   albuterol  (PROVENTIL ) (2.5 MG/3ML) 0.083% nebulizer solution Take 3 mLs (2.5 mg total) by nebulization every 6 (six) hours as needed for wheezing or shortness of breath.   aspirin  EC 81 MG tablet Take 81 mg by mouth daily. Swallow whole.   blood glucose meter kit and supplies 1 each by Other route 2 (two) times daily as needed for other. Dispense based on patient and insurance preference. . (FOR ICD-10 E10.9, E11.9).Dispense based on patient and insurance preference.   carvedilol  (COREG ) 12.5 MG tablet Take 1 tablet (12.5 mg total) by mouth 2 (two) times daily.   Estradiol  10 MCG TABS vaginal tablet Place 1 tablet (10 mcg total) vaginally 2 (two) times a week.   fosfomycin (MONUROL ) 3 g PACK Take one by mouth every 10 days for UTI suppression.  furosemide  (LASIX ) 80 MG tablet Take 1 tablet (80 mg total) by mouth daily as needed. For fluid   glucose blood (GLUCOSE METER TEST) test strip Use BID   hydrOXYzine  (VISTARIL ) 25 MG capsule Take 1 capsule (25 mg total) by mouth every 8 (eight) hours as needed.   levothyroxine  (SYNTHROID ) 88 MCG tablet Take 1 tablet (88 mcg total) by mouth daily before breakfast.   metFORMIN  (GLUCOPHAGE -XR) 750 MG 24 hr tablet Take 1 tablet (750 mg total) by mouth  2 (two) times daily. **NEEDS TO BE SEEN BEFORE NEXT REFILL**   ondansetron  (ZOFRAN -ODT) 4 MG disintegrating tablet Take 1 tablet (4 mg total) by mouth every 8 (eight) hours as needed for nausea or vomiting.   pantoprazole  (PROTONIX ) 40 MG tablet Take 1 tablet (40 mg total) by mouth daily before breakfast.   polyethylene glycol (MIRALAX ) 17 g packet Take 17 g by mouth daily.   spironolactone  (ALDACTONE ) 25 MG tablet Take 1 tablet (25 mg total) by mouth daily. **NEEDS TO BE SEEN BEFORE NEXT REFILL**   No facility-administered encounter medications on file as of 09/12/2023.    Allergies (verified) Buspar  [buspirone ], Lexapro  [escitalopram  oxalate], Trulicity [dulaglutide], Bactrim, Cephalexin, Chlorhexidine  gluconate, Clindamycin , Contrast media [iodinated contrast media], Crestor  [rosuvastatin ], Doxycycline, Latex, Levofloxacin, Nitrofurantoin monohyd macro, and Penicillins   History: Past Medical History:  Diagnosis Date   Anxiety    Chronic systolic heart failure (HCC)    Constipation    Cyst of right kidney 11/12/2017   Glucose intolerance (pre-diabetes)    Hyperlipemia    Hypothyroidism    LBBB (left bundle branch block)    Mitral regurgitation    Mild to Moderate   Nonischemic cardiomyopathy (HCC)    LVEF 35-40% by echo 8/13   Paroxysmal atrial fibrillation (HCC)    Brief episodes by device interrogation   UTI (urinary tract infection)    Recurrent   Ventricular tachycardia (HCC)    ICD therapy for VT   Past Surgical History:  Procedure Laterality Date   APPENDECTOMY     BIV ICD GENERATOR CHANGEOUT N/A 07/11/2016   Procedure: BiV ICD Generator Changeout;  Surgeon: Kelsie Agent, MD;  Location: [facility] INVASIVE CV LAB;  Service: Cardiovascular;  Laterality: N/A;   CARDIAC DEFIBRILLATOR PLACEMENT  09/2009   St. Jude BiV ICD by Dr. Darleen class III   CHOLECYSTECTOMY     has one ovary left     KNEE ARTHROSCOPY     Left   LEFT AND RIGHT HEART CATHETERIZATION WITH CORONARY  ANGIOGRAM N/A 04/12/2014   Procedure: LEFT AND RIGHT HEART CATHETERIZATION WITH CORONARY ANGIOGRAM;  Surgeon: Alm LELON Clay, MD;  Location[facility] CATH LAB;  Service: Cardiovascular;  Laterality: N/A;   Left breast biopsy x 2     no maliganancy   PACEMAKER IMPLANT     RIGHT HEART CATH N/A 10/27/2020   Procedure: RIGHT HEART CATH;  Surgeon: Rolan Ezra RAMAN, MD;  Location: [facility] INVASIVE CV LAB;  Service: Cardiovascular;  Laterality: N/A;   RIGHT/LEFT HEART CATH AND CORONARY ANGIOGRAPHY N/A 03/26/2019   Procedure: RIGHT/LEFT HEART CATH AND CORONARY ANGIOGRAPHY;  Surgeon: Rolan Ezra RAMAN, MD;  Location: [facility] INVASIVE CV LAB;  Service: Cardiovascular;  Laterality: N/A;   TOTAL ABDOMINAL HYSTERECTOMY     (R) ovary removed   Family History  Problem Relation Age of Onset   CVA Mother        multiple   Heart Problems Mother        weak heart   Heart disease  Maternal Grandmother    Heart disease Maternal Grandfather    Cancer Father        Stomach and eshpogus   Diabetes Neg Hx    Hypertension Neg Hx    Coronary artery disease Neg Hx    Social History   Socioeconomic History   Marital status: Widowed    Spouse name: Not on file   Number of children: 2   Years of education: Not on file   Highest education level: Not on file  Occupational History   Not on file  Tobacco Use   Smoking status: Former    Current packs/day: 0.00    Average packs/day: 0.3 packs/day for 46.5 years (13.9 ttl pk-yrs)    Types: Cigarettes    Start date: 08/03/1949    Quit date: 01/22/1996    Years since quitting: 27.6    Passive exposure: Never   Smokeless tobacco: Never  Vaping Use   Vaping status: Never Used  Substance and Sexual Activity   Alcohol use: No    Alcohol/week: 0.0 standard drinks of alcohol   Drug use: No   Sexual activity: Not Currently    Birth control/protection: Surgical    Comment: hyst  Other Topics Concern   Not on file  Social History Narrative   Her daughter, Maurilio lives with her -  takes her to all visits and cooks for her.   Social Drivers of Corporate investment banker Strain: Low Risk  (09/12/2023)   Overall Financial Resource Strain (CARDIA)    Difficulty of Paying Living Expenses: Not hard at all  Food Insecurity: No Food Insecurity (09/12/2023)   Hunger Vital Sign    Worried About Running Out of Food in the Last Year: Never true    Ran Out of Food in the Last Year: Never true  Transportation Needs: No Transportation Needs (09/12/2023)   PRAPARE - Administrator, Civil Service (Medical): No    Lack of Transportation (Non-Medical): No  Physical Activity: Sufficiently Active (09/12/2023)   Exercise Vital Sign    Days of Exercise per Week: 7 days    Minutes of Exercise per Session: 30 min  Stress: No Stress Concern Present (09/12/2023)   [employer] of Occupational Health - Occupational Stress Questionnaire    Feeling of Stress: Not at all  Social Connections: Socially Isolated (09/12/2023)   Social Connection and Isolation Panel    Frequency of Communication with Friends and Family: More than three times a week    Frequency of Social Gatherings with Friends and Family: More than three times a week    Attends Religious Services: Never    Database administrator or Organizations: No    Attends Banker Meetings: Never    Marital Status: Widowed    Tobacco Counseling Counseling given: Yes    Clinical Intake:  Pre-visit preparation completed: Yes  Pain : 0-10 (knee pain) Pain Score: 10-Worst pain ever Pain Type: Chronic pain Pain Location: Knee Pain Orientation: Right, Left Pain Descriptors / Indicators: Constant Pain Frequency: Constant Pain Relieving Factors: tylenol   Pain Relieving Factors: tylenol   BMI - recorded: 29.81 Nutritional Status: BMI 25 -29 Overweight Nutritional Risks: None Diabetes: No  Lab Results  Component Value Date   HGBA1C 6.1 (H) 04/11/2023   HGBA1C 6.3 (H) 10/22/2022   HGBA1C 7.2 (H)  06/04/2022     How often do you need to have someone help you when you read instructions, pamphlets, or other written materials from  your doctor or pharmacy?: 1 - Never  Interpreter Needed?: No  Information entered by :: alia t/cma   Activities of Daily Living     09/12/2023    9:27 AM  In your present state of health, do you have any difficulty performing the following activities:  Hearing? 0  Vision? 0  Difficulty concentrating or making decisions? 0  Walking or climbing stairs? 1  Dressing or bathing? 0  Doing errands, shopping? 1  Comment pt's son/great-grand daughter  Quarry manager and eating ? N  Using the Toilet? N  In the past six months, have you accidently leaked urine? Y  Do you have problems with loss of bowel control? N  Managing your Medications? N  Managing your Finances? N  Housekeeping or managing your Housekeeping? N    Patient Care Team: Lavell Bari LABOR, FNP as PCP - General (Family Medicine) Debera Jayson MATSU, MD as PCP - Cardiology (Cardiology) Mealor, Eulas BRAVO, MD as PCP - Electrophysiology (Cardiology) Billee Mliss BIRCH, [facility] as Pharmacist (Family Medicine) Ladora Ross Lacy Phebe, MD as Referring Physician (Optometry)  I have updated your Care Teams any recent Medical Services you may have received from other providers in the past year.     Assessment:   This is a routine wellness examination for [place].  Hearing/Vision screen Hearing Screening - Comments:: Pt denies hearing dif Vision Screening - Comments:: Pt have vision dif/pt goes to [employer] pt last ov 2 yrs ago   Goals Addressed             This Visit's Progress    Patient Stated       Would like to loose 20 more lbs.        Depression Screen     09/12/2023    9:36 AM 08/11/2023   11:41 AM 08/08/2023    3:59 PM 05/19/2023    9:26 AM 10/22/2022    3:47 PM 09/11/2022   11:04 AM 06/04/2022   11:02 AM  PHQ 2/9 Scores  PHQ - 2 Score 0 0 0 0 0 0 0  PHQ- 9  Score 0 0 0  0  0    Fall Risk     08/08/2023    3:59 PM 05/19/2023    9:26 AM 10/22/2022    3:47 PM 09/11/2022   11:02 AM 06/04/2022   11:04 AM  Fall Risk   Falls in the past year? 0 0 1 1 0  Number falls in past yr: 0 0 1 1 0  Injury with Fall? 0 0  1 0  Risk for fall due to : No Fall Risks Impaired balance/gait;Impaired mobility Orthopedic patient;Impaired balance/gait;Impaired vision;Impaired mobility History of fall(s);Impaired balance/gait;Orthopedic patient No Fall Risks  Follow up Falls evaluation completed;Education provided Falls evaluation completed Falls evaluation completed;Education provided Education provided;Falls prevention discussed;Falls evaluation completed Falls evaluation completed    MEDICARE RISK AT HOME:  Medicare Risk at Home Any stairs in or around the home?: No If so, are there any without handrails?: No Home free of loose throw rugs in walkways, pet beds, electrical cords, etc?: Yes Adequate lighting in your home to reduce risk of falls?: Yes Life alert?: No Use of a cane, walker or w/c?: No Grab bars in the bathroom?: Yes Shower chair or bench in shower?: Yes Elevated toilet seat or a handicapped toilet?: Yes  TIMED UP AND GO:  Was the test performed?  no  Cognitive Function: 6CIT completed  09/12/2023    9:29 AM 09/11/2022   11:05 AM 07/18/2021   10:44 AM 04/21/2019    8:33 AM  6CIT Screen  What Year? 0 points 0 points 0 points 0 points  What month? 0 points 0 points 0 points 0 points  What time? 0 points 0 points 0 points 0 points  Count back from 20 0 points 0 points 0 points 0 points  Months in reverse 0 points 0 points 0 points 0 points  Repeat phrase 0 points 0 points 0 points   Total Score 0 points 0 points 0 points     Immunizations Immunization History  Administered Date(s) Administered   Influenza,inj,Quad PF,6+ Mos 11/12/2017   Influenza-Unspecified 11/11/2008, 01/08/2011   Pneumococcal Polysaccharide-23 07/11/2009     Screening Tests Health Maintenance  Topic Date Due   COVID-19 Vaccine (1) Never done   Zoster Vaccines- Shingrix (1 of 2) Never done   Pneumococcal Vaccine: 50+ Years (2 of 2 - PCV) 07/12/2010   OPHTHALMOLOGY EXAM  07/05/2022   INFLUENZA VACCINE  08/22/2023   DEXA SCAN  04/10/2024 (Originally 11/14/2021)   HEMOGLOBIN A1C  10/12/2023   FOOT EXAM  10/22/2023   MAMMOGRAM  11/20/2023   Diabetic kidney evaluation - eGFR measurement  04/10/2024   Diabetic kidney evaluation - Urine ACR  04/13/2024   Medicare Annual Wellness (AWV)  09/11/2024   HPV VACCINES  Aged Out   Meningococcal B Vaccine  Aged Out   DTaP/Tdap/Td  Discontinued   Hepatitis C Screening  Discontinued    Health Maintenance  Health Maintenance Due  Topic Date Due   COVID-19 Vaccine (1) Never done   Zoster Vaccines- Shingrix (1 of 2) Never done   Pneumococcal Vaccine: 50+ Years (2 of 2 - PCV) 07/12/2010   OPHTHALMOLOGY EXAM  07/05/2022   INFLUENZA VACCINE  08/22/2023   Health Maintenance Items Addressed: See Nurse Notes at the end of this note  Additional Screening:  Vision Screening: Recommended annual ophthalmology exams for early detection of glaucoma and other disorders of the eye. Would you like a referral to an eye doctor? No    Dental Screening: Recommended annual dental exams for proper oral hygiene  Community Resource Referral / Chronic Care Management: CRR required this visit?  No   CCM required this visit?  No   Plan:    I have personally reviewed and noted the following in the patient's chart:   Medical and social history Use of alcohol, tobacco or illicit drugs  Current medications and supplements including opioid prescriptions. Patient is not currently taking opioid prescriptions. Functional ability and status Nutritional status Physical activity Advanced directives List of other physicians Hospitalizations, surgeries, and ER visits in previous 12 months Vitals Screenings to  include cognitive, depression, and falls Referrals and appointments  In addition, I have reviewed and discussed with patient certain preventive protocols, quality metrics, and best practice recommendations. A written personalized care plan for preventive services as well as general preventive health recommendations were provided to patient.   Ozie Ned, CMA   09/12/2023   After Visit Summary: (Declined) Due to this being a telephonic visit, with patients personalized plan was offered to patient but patient Declined AVS at this time   Notes: PCP Follow Up Recommendations: Pt is aware and due for the following: flu--per pt can't take the flu vaccine, makes her sick, pneumonia, shingles vaccine and diabetic eye exam

## 2023-09-15 ENCOUNTER — Telehealth: Payer: Self-pay | Admitting: Cardiology

## 2023-09-15 NOTE — Telephone Encounter (Signed)
 1. Which medications need to be refilled? (please list name of each medication and dose if known)   Eliquis   2. Which pharmacy/location (including street and city if local pharmacy) is medication to be sent to  [facility] Pt went through patient assistance    3. Do they need a 30 day or 90 day supply?   [address] sent pt a letter that they have not received a prescription for her  Fax:[phone] Phone number [phone]

## 2023-09-16 NOTE — Telephone Encounter (Signed)
 Routed to pt assistance to see if they are able to help with getting the prescription sent to [facility]

## 2023-09-21 ENCOUNTER — Other Ambulatory Visit: Payer: Self-pay | Admitting: Family

## 2023-09-23 ENCOUNTER — Ambulatory Visit: Attending: Cardiovascular Disease

## 2023-09-23 DIAGNOSIS — I5022 Chronic systolic (congestive) heart failure: Secondary | ICD-10-CM | POA: Diagnosis not present

## 2023-09-23 DIAGNOSIS — Z95810 Presence of automatic (implantable) cardiac defibrillator: Secondary | ICD-10-CM

## 2023-09-24 NOTE — Telephone Encounter (Signed)
 Refaxed signed and dated application per BMS request and flagged they noted DOB incorrectly. Status still pending.

## 2023-09-24 NOTE — Telephone Encounter (Signed)
   Scanned in media -also they have dob [birth year]

## 2023-09-24 NOTE — Progress Notes (Signed)
 EPIC Encounter for ICM Monitoring  Patient Name: Melissa Paul is a 78 y.o. female Date: 09/24/2023 Primary Care Physican: Lavell Bari LABOR, FNP Primary Cardiologist: [place] Electrophysiologist: Mealor Bi-V Pacing: 91%        01/01/2022 Weight: 178 lbs 01/27/2023 Weight: 163 lbs   02/03/2023 Weight: 165 lbs 03/05/2023 Weight: 163 lbs (up 4 lbs) 06/25/2023 Weight: 152-155 (was 187 lbs) 09/23/2023 Weight:  150-155 lbs   AT/AF Burden: 1.9%    Battery Longevity: <3 months          Spoke with patient and heart failure questions reviewed.  Transmission results reviewed.  Pt asymptomatic for fluid accumulation.  She continues to have chronic UTIs which also causes fluctuates in fluid levels.      CorVue Thoracic impedance suggesting possible fluid accumulation from 8/4-8/17.  Also suggesting possible dryness 8/18-8/28 and back to normal 8/29.   Prescribed:  Furosemide  80 mg Take 1 tablet (80 mg total) by mouth daily as needed.  Pt takes extra if needed.   06/25/22 She is taking about twice a week due to her body can't handle Furosemide  daily.   Spirolactone 25 mg take 1 tablet by mouth daily   Labs: 04/11/2023 Creatinine 1.46, BUN 20, Potassium 3.6, Sodium 141, eGFR 36 02/11/2023 Creatinine 1.27, BUN 20, Potassium 3.8, Sodium 139, eGFR 43 12/10/2022 Creatinine 0.97, BUN 13, Potassium 3.5, Sodium 140, eGFR 59 10/22/2022 Creatinine 1.31, BUN 25, Potassium 4.6, Sodium 139 08/13/2022 Creatinine 1.52, BUN 30, Potassium 3.8, Sodium 138, eGFR 35 A complete set of results can be found in Results Review.   Recommendations:   No changes and encouraged to call if experiencing any fluid symptoms.    Follow-up plan: ICM clinic phone appointment on 11/03/2023.   91 day device clinic remote transmission 10/13/2023.    EP/Cardiology Office Visits:  11/28/2023 with Dr Nancey.  Recall 02/02/2024 with Dr Debera.   Aware needs to call Dr Orvilla office for appointment.  Recall 04/03/2022 with Dr Rolan  (last visit 12/04/2021)   Copy of ICM check sent to Dr. Nancey.   3 month ICM trend: 09/23/2023.    12-14 Month ICM trend:     [name], RN 09/24/2023 4:18 PM

## 2023-09-25 ENCOUNTER — Ambulatory Visit (INDEPENDENT_AMBULATORY_CARE_PROVIDER_SITE_OTHER): Admitting: Family

## 2023-09-25 ENCOUNTER — Encounter: Payer: Self-pay | Admitting: Family

## 2023-09-25 VITALS — BP 91/50 | HR 87 | Temp 97.9°F | Ht 62.0 in | Wt 160.6 lb

## 2023-09-25 DIAGNOSIS — E1165 Type 2 diabetes mellitus with hyperglycemia: Secondary | ICD-10-CM

## 2023-09-25 DIAGNOSIS — D509 Iron deficiency anemia, unspecified: Secondary | ICD-10-CM

## 2023-09-25 DIAGNOSIS — K219 Gastro-esophageal reflux disease without esophagitis: Secondary | ICD-10-CM

## 2023-09-25 DIAGNOSIS — N1832 Chronic kidney disease, stage 3b: Secondary | ICD-10-CM

## 2023-09-25 DIAGNOSIS — I480 Paroxysmal atrial fibrillation: Secondary | ICD-10-CM | POA: Diagnosis not present

## 2023-09-25 DIAGNOSIS — I700 Atherosclerosis of aorta: Secondary | ICD-10-CM

## 2023-09-25 DIAGNOSIS — Z7984 Long term (current) use of oral hypoglycemic drugs: Secondary | ICD-10-CM

## 2023-09-25 DIAGNOSIS — Z889 Allergy status to unspecified drugs, medicaments and biological substances status: Secondary | ICD-10-CM

## 2023-09-25 DIAGNOSIS — N390 Urinary tract infection, site not specified: Secondary | ICD-10-CM

## 2023-09-25 DIAGNOSIS — I5022 Chronic systolic (congestive) heart failure: Secondary | ICD-10-CM | POA: Diagnosis not present

## 2023-09-25 DIAGNOSIS — E039 Hypothyroidism, unspecified: Secondary | ICD-10-CM

## 2023-09-25 DIAGNOSIS — F411 Generalized anxiety disorder: Secondary | ICD-10-CM

## 2023-09-25 DIAGNOSIS — K5900 Constipation, unspecified: Secondary | ICD-10-CM

## 2023-09-25 MED ORDER — CARVEDILOL 12.5 MG PO TABS: 12.5000 mg | ORAL_TABLET | Freq: Two times a day (BID) | ORAL | 1 refills | Status: DC

## 2023-09-25 MED ORDER — METFORMIN HCL ER 750 MG PO TB24
750.0000 mg | ORAL_TABLET | Freq: Two times a day (BID) | ORAL | 1 refills | Status: AC
Start: 2023-09-25 — End: ?

## 2023-09-25 MED ORDER — FUROSEMIDE 80 MG PO TABS: 80.0000 mg | ORAL_TABLET | Freq: Every day | ORAL | 1 refills | Status: DC | PRN

## 2023-09-25 MED ORDER — LEVOTHYROXINE SODIUM 88 MCG PO TABS: 88.0000 ug | ORAL_TABLET | Freq: Every day | ORAL | 1 refills | Status: AC

## 2023-09-25 MED ORDER — PANTOPRAZOLE SODIUM 40 MG PO TBEC: 40.0000 mg | DELAYED_RELEASE_TABLET | Freq: Every day | ORAL | 1 refills | Status: AC

## 2023-09-25 NOTE — Patient Instructions (Signed)

## 2023-09-25 NOTE — Progress Notes (Signed)
 Subjective:    Patient ID: Melissa Melissa Paul, female    DOB: [birth year], 78 y.o.   MRN: [id number]  Chief Complaint  Patient presents with   6 MONTH FOLLOW UP   Urinary Tract Infection   Pt presents to the office today for chronic follow up.   She is followed by Cardiologists for A Fib, CHF, and cardiomyopathy. And has a pacemaker. She is followed by CHF clinic and weighs Melissa Paul. She takes Eliquis  5 mg BID for A FIB.   She had a right heart cath on 10/27/20. She reports she is doing well.   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    Has atherosclerosis, but does not take a statin.    Has CKD and avoids NSAID's.    She followed by Urologists  for frequent UTI's. Has a scope planned 10/14/23.  Congestive Heart Failure Presents for follow-up visit. Associated symptoms include abdominal pain (pressure), edema, fatigue and orthopnea. The symptoms have been stable.  Gastroesophageal Reflux She complains of abdominal pain (pressure), belching and heartburn. This is a chronic problem. The current episode started more than 1 year ago. The problem occurs occasionally. The symptoms are aggravated by certain foods. Associated symptoms include fatigue and orthopnea. Risk factors include obesity. She has tried a PPI for the symptoms. The treatment provided moderate relief.  Diabetes She presents for her follow-up diabetic visit. She has type 2 diabetes mellitus. Hypoglycemia symptoms include nervousness/anxiousness. Associated symptoms include blurred vision, fatigue and foot paresthesias. Symptoms are stable. Diabetic complications include peripheral neuropathy. Risk factors for coronary artery disease include dyslipidemia, diabetes mellitus, hypertension, sedentary lifestyle and post-menopausal. She is following a generally healthy diet. Her overall blood glucose range is 90-110 mg/dl.  Anxiety Presents for follow-up visit. Symptoms include excessive worry and  nervous/anxious behavior. Symptoms occur occasionally. The severity of symptoms is mild.    Constipation This is a chronic problem. The current episode started more than 1 year ago. The problem is unchanged. Her stool frequency is 2 to 3 times per week. Associated symptoms include abdominal pain (pressure). She has tried laxatives for the symptoms. The treatment provided moderate relief.  Thyroid  Problem Presents for follow-up visit. Symptoms include anxiety, constipation, dry skin and fatigue. The symptoms have been stable.  Flank Pain This is a recurrent problem. The current episode started more than 1 month ago. The problem occurs constantly. The problem has been waxing and waning since onset. Pain location: bilateral flank pain. The quality of the pain is described as burning (pressure). The pain is mild. Associated symptoms include abdominal pain (pressure) and dysuria. Treatments tried: anitibotics forcing fluids. The treatment provided mild relief.      Review of Systems  Constitutional:  Positive for fatigue.  Eyes:  Positive for blurred vision.  Gastrointestinal:  Positive for abdominal pain (pressure), constipation and heartburn.  Genitourinary:  Positive for dysuria and flank pain.  Psychiatric/Behavioral:  The patient is nervous/anxious.   All other systems reviewed and are negative.      Objective:   Physical Exam Vitals reviewed.  Constitutional:      General: She is not in acute distress.    Appearance: She is well-developed.  HENT:     Head: Normocephalic and atraumatic.     Right Ear: Tympanic membrane normal.     Left Ear: Tympanic membrane normal.  Eyes:     Pupils: Pupils are equal, round, and reactive to light.  Neck:     Thyroid :  No thyromegaly.  Cardiovascular:     Rate and Rhythm: Normal rate and regular rhythm.     Heart sounds: Normal heart sounds. No murmur heard. Pulmonary:     Effort: Pulmonary effort is normal. No respiratory distress.      Breath sounds: Normal breath sounds. No wheezing.  Abdominal:     General: Bowel sounds are normal. There is no distension.     Palpations: Abdomen is soft.     Tenderness: There is no abdominal tenderness.  Musculoskeletal:        General: No tenderness. Normal range of motion.     Cervical back: Normal range of motion and neck supple.     Right lower leg: No edema.     Left lower leg: No edema (none on exam).  Skin:    General: Skin is warm and dry.     Findings: No erythema.  Neurological:     Mental Status: She is alert and oriented to person, place, and time.     Cranial Nerves: No cranial nerve deficit.     Deep Tendon Reflexes: Reflexes are normal and symmetric.  Psychiatric:        Behavior: Behavior normal.        Thought Content: Thought content normal.        Judgment: Judgment normal.      BP (!) 99/53   Pulse 87   Temp 97.9 F (36.6 C)   Ht 5' 2 (1.575 m)   Wt 160 lb 9.6 oz (72.8 kg)   SpO2 94%   BMI 29.37 kg/m       Assessment & Plan:  Melissa Melissa Paul comes in today with chief complaint of 6 MONTH FOLLOW UP and Urinary Tract Infection   Diagnosis and orders addressed:  1. Hypothyroidism, unspecified type (Primary) - levothyroxine  (SYNTHROID ) 88 MCG tablet; Take 1 tablet (88 mcg total) by mouth Melissa Paul before breakfast.  Dispense: 90 tablet; Refill: 1 - CMP14+EGFR - CBC with Differential/Platelet  2. Chronic systolic heart failure (HCC) - carvedilol  (COREG ) 12.5 MG tablet; Take 1 tablet (12.5 mg total) by mouth 2 (two) times Melissa Paul.  Dispense: 60 tablet; Refill: 1 - furosemide  (LASIX ) 80 MG tablet; Take 1 tablet (80 mg total) by mouth Melissa Paul as needed. For fluid  Dispense: 90 tablet; Refill: 1 - CMP14+EGFR - CBC with Differential/Platelet  3. Paroxysmal atrial fibrillation (HCC) - CMP14+EGFR - CBC with Differential/Platelet  4. Type 2 diabetes mellitus with hyperglycemia, without long-term current use of insulin  (HCC) - metFORMIN  (GLUCOPHAGE -XR)  750 MG 24 hr tablet; Take 1 tablet (750 mg total) by mouth in the morning and at bedtime.  Dispense: 180 tablet; Refill: 1 - Bayer DCA Hb A1c Waived - CMP14+EGFR - CBC with Differential/Platelet  5. Constipation, unspecified constipation type - CMP14+EGFR - CBC with Differential/Platelet  6. Recurrent UTI - CMP14+EGFR - CBC with Differential/Platelet - Urinalysis, Complete - Urine Culture  7. GAD (generalized anxiety disorder) - CMP14+EGFR - CBC with Differential/Platelet  8. Gastroesophageal reflux disease, unspecified whether esophagitis present - pantoprazole  (PROTONIX ) 40 MG tablet; Take 1 tablet (40 mg total) by mouth Melissa Paul before breakfast.  Dispense: 90 tablet; Refill: 1 - CMP14+EGFR - CBC with Differential/Platelet  9. Stage 3b chronic kidney disease (HCC) - CMP14+EGFR - CBC with Differential/Platelet  10. Atherosclerosis of aorta (HCC) - CMP14+EGFR - CBC with Differential/Platelet  11. Iron deficiency anemia, unspecified iron deficiency anemia type - CMP14+EGFR - CBC with Differential/Platelet  12. Multiple drug allergies - CMP14+EGFR - CBC  with Differential/Platelet   Labs pending Will hold spironolactone  25 mg Continue current medications  Keep follow up with Urologists! Health Maintenance reviewed Diet and exercise encouraged  Follow up plan: 2 weeks to follow up on hypotension.   [name], FNP

## 2023-09-26 ENCOUNTER — Other Ambulatory Visit

## 2023-09-26 LAB — BAYER DCA HB A1C WAIVED: HB A1C (BAYER DCA - WAIVED): 5.6 % (ref 4.8–5.6)

## 2023-09-26 LAB — URINALYSIS, COMPLETE
Bilirubin, UA: NEGATIVE
Glucose, UA: NEGATIVE
Ketones, UA: NEGATIVE
Nitrite, UA: POSITIVE — AB
Protein,UA: NEGATIVE
Specific Gravity, UA: 1.01 (ref 1.005–1.030)
Urobilinogen, Ur: 0.2 mg/dL (ref 0.2–1.0)
pH, UA: 6 (ref 5.0–7.5)

## 2023-09-26 LAB — MICROSCOPIC EXAMINATION
Renal Epithel, UA: NONE SEEN /HPF
WBC, UA: >30 /HPF — AB (ref 0–5)

## 2023-09-26 NOTE — Telephone Encounter (Signed)
   Scanned in media

## 2023-09-27 LAB — CBC WITH DIFFERENTIAL/PLATELET
Basophils Absolute: 0 x10E3/uL (ref 0.0–0.2)
Basos: 0 %
EOS (ABSOLUTE): 0 x10E3/uL (ref 0.0–0.4)
Eos: 1 %
Hematocrit: 36.6 % (ref 34.0–46.6)
Hemoglobin: 12.5 g/dL (ref 11.1–15.9)
Immature Grans (Abs): 0 x10E3/uL (ref 0.0–0.1)
Immature Granulocytes: 0 %
Lymphocytes Absolute: 2.5 x10E3/uL (ref 0.7–3.1)
Lymphs: 42 %
MCH: 32.8 pg (ref 26.6–33.0)
MCHC: 34.2 g/dL (ref 31.5–35.7)
MCV: 96 fL (ref 79–97)
Monocytes Absolute: 0.5 x10E3/uL (ref 0.1–0.9)
Monocytes: 9 %
Neutrophils Absolute: 2.9 x10E3/uL (ref 1.4–7.0)
Neutrophils: 47 %
Platelets: 186 x10E3/uL (ref 150–450)
RBC: 3.81 x10E6/uL (ref 3.77–5.28)
RDW: 12.7 % (ref 11.7–15.4)
WBC: 6.1 x10E3/uL (ref 3.4–10.8)

## 2023-09-27 LAB — CMP14+EGFR
ALT: 7 IU/L (ref 0–32)
AST: 13 IU/L (ref 0–40)
Albumin: 4.8 g/dL (ref 3.8–4.8)
Alkaline Phosphatase: 70 IU/L (ref 44–121)
BUN/Creatinine Ratio: 19 (ref 12–28)
BUN: 26 mg/dL (ref 8–27)
Bilirubin Total: 0.6 mg/dL (ref 0.0–1.2)
CO2: 27 mmol/L (ref 20–29)
Calcium: 9.6 mg/dL (ref 8.7–10.3)
Chloride: 96 mmol/L (ref 96–106)
Creatinine, Ser: 1.36 mg/dL — ABNORMAL HIGH (ref 0.57–1.00)
Globulin, Total: 2.1 g/dL (ref 1.5–4.5)
Glucose: 120 mg/dL — ABNORMAL HIGH (ref 70–99)
Potassium: 4.5 mmol/L (ref 3.5–5.2)
Sodium: 139 mmol/L (ref 134–144)
Total Protein: 6.9 g/dL (ref 6.0–8.5)
eGFR: 39 mL/min/1.73 — ABNORMAL LOW (ref >59)

## 2023-09-27 LAB — TSH: TSH: 3.39 u[IU]/mL (ref 0.450–4.500)

## 2023-09-28 ENCOUNTER — Other Ambulatory Visit: Payer: Self-pay

## 2023-09-28 ENCOUNTER — Emergency Department (HOSPITAL_COMMUNITY)

## 2023-09-28 ENCOUNTER — Emergency Department (HOSPITAL_COMMUNITY)
Admission: EM | Admit: 2023-09-28 | Discharge: 2023-09-28 | Disposition: A | Discharge status: Home / Self Care | Attending: Emergency Medicine | Admitting: Emergency Medicine

## 2023-09-28 ENCOUNTER — Encounter (HOSPITAL_COMMUNITY): Payer: Self-pay

## 2023-09-28 DIAGNOSIS — Z950 Presence of cardiac pacemaker: Secondary | ICD-10-CM

## 2023-09-28 DIAGNOSIS — Z45018 Encounter for adjustment and management of other part of cardiac pacemaker: Secondary | ICD-10-CM | POA: Insufficient documentation

## 2023-09-28 DIAGNOSIS — Z91040 Latex allergy status: Secondary | ICD-10-CM | POA: Insufficient documentation

## 2023-09-28 DIAGNOSIS — Z7982 Long term (current) use of aspirin: Secondary | ICD-10-CM | POA: Insufficient documentation

## 2023-09-28 LAB — COMPREHENSIVE METABOLIC PANEL WITH GFR
ALT: 9 U/L (ref 0–44)
AST: 16 U/L (ref 15–41)
Albumin: 4.4 g/dL (ref 3.5–5.0)
Alkaline Phosphatase: 55 U/L (ref 38–126)
Anion gap: 15 (ref 5–15)
BUN: 25 mg/dL — ABNORMAL HIGH (ref 8–23)
CO2: 26 mmol/L (ref 22–32)
Calcium: 8.9 mg/dL (ref 8.9–10.3)
Chloride: 97 mmol/L — ABNORMAL LOW (ref 98–111)
Creatinine, Ser: 1.42 mg/dL — ABNORMAL HIGH (ref 0.44–1.00)
GFR, Estimated: 37 mL/min — ABNORMAL LOW (ref >60)
Glucose, Bld: 135 mg/dL — ABNORMAL HIGH (ref 70–99)
Potassium: 3.1 mmol/L — ABNORMAL LOW (ref 3.5–5.1)
Sodium: 138 mmol/L (ref 135–145)
Total Bilirubin: 0.8 mg/dL (ref 0.0–1.2)
Total Protein: 7 g/dL (ref 6.5–8.1)

## 2023-09-28 LAB — CBC WITH DIFFERENTIAL/PLATELET
Abs Immature Granulocytes: 0.04 K/uL (ref 0.00–0.07)
Basophils Absolute: 0 K/uL (ref 0.0–0.1)
Basophils Relative: 0 %
Eosinophils Absolute: 0 K/uL (ref 0.0–0.5)
Eosinophils Relative: 0 %
HCT: 35.3 % — ABNORMAL LOW (ref 36.0–46.0)
Hemoglobin: 12.5 g/dL (ref 12.0–15.0)
Immature Granulocytes: 1 %
Lymphocytes Relative: 38 %
Lymphs Abs: 2 K/uL (ref 0.7–4.0)
MCH: 33.2 pg (ref 26.0–34.0)
MCHC: 35.4 g/dL (ref 30.0–36.0)
MCV: 93.6 fL (ref 80.0–100.0)
Monocytes Absolute: 0.5 K/uL (ref 0.1–1.0)
Monocytes Relative: 9 %
Neutro Abs: 2.7 K/uL (ref 1.7–7.7)
Neutrophils Relative %: 52 %
Platelets: 173 K/uL (ref 150–400)
RBC: 3.77 MIL/uL — ABNORMAL LOW (ref 3.87–5.11)
RDW: 12.9 % (ref 11.5–15.5)
WBC: 5.3 K/uL (ref 4.0–10.5)
nRBC: 0 % (ref 0.0–0.2)

## 2023-09-28 LAB — TROPONIN I (HIGH SENSITIVITY): Troponin I (High Sensitivity): 10 ng/L (ref <18)

## 2023-09-28 MED ORDER — POTASSIUM CHLORIDE CRYS ER 10 MEQ PO TBCR: 30.0000 meq | EXTENDED_RELEASE_TABLET | Freq: Two times a day (BID) | ORAL | 0 refills | Status: DC

## 2023-09-28 MED ORDER — POTASSIUM CHLORIDE CRYS ER 20 MEQ PO TBCR
30.0000 meq | EXTENDED_RELEASE_TABLET | Freq: Once | ORAL | Status: AC
  Administered 2023-09-28: 30 meq via ORAL
  Filled 2023-09-28: qty 2

## 2023-09-28 NOTE — ED Triage Notes (Signed)
 Pt stated that her pacemaker/defibrillator shocked her once at home and then started beeping.

## 2023-09-28 NOTE — ED Provider Notes (Signed)
 Melissa Paul Provider Note   CSN: [id number] Arrival date & time: 09/28/23  1244     Patient presents with: Pacemaker Problem   Melissa Paul is a 78 y.o. female.  {Add pertinent medical, surgical, social history, OB history to YEP:67052} Patient states her defibrillator went off once yesterday and then last night she had some beeping in her defibrillator for a few seconds twice.  She feels fine   Chest Pain      Prior to Admission medications   Medication Sig Start Date End Date Taking? Authorizing Provider  potassium chloride  SA (KLOR-CON  M) 10 MEQ tablet Take 3 tablets (30 mEq total) by mouth 2 (two) times daily. 09/28/23  Yes Charley Lafrance, MD  albuterol  (PROVENTIL ) (2.5 MG/3ML) 0.083% nebulizer solution Take 3 mLs (2.5 mg total) by nebulization every 6 (six) hours as needed for wheezing or shortness of breath. 01/09/22   Joesph Annabella HERO, FNP  aspirin  EC 81 MG tablet Take 81 mg by mouth daily. Swallow whole.    [provider]  blood glucose meter kit and supplies 1 each by Other route 2 (two) times daily as needed for other. Dispense based on patient and insurance preference. . (FOR ICD-10 E10.9, E11.9).Dispense based on patient and insurance preference. 07/30/21   Lavell Bari LABOR, FNP  carvedilol  (COREG ) 12.5 MG tablet Take 1 tablet (12.5 mg total) by mouth 2 (two) times daily. 09/25/23   Lavell Bari LABOR, FNP  Estradiol  10 MCG TABS vaginal tablet Place 1 tablet (10 mcg total) vaginally 2 (two) times a week. 11/14/22   Wrenn, John, MD  fosfomycin (MONUROL ) 3 g PACK Take one by mouth every 10 days for UTI suppression. 08/20/23   Gerldine Lauraine BROCKS, FNP  furosemide  (LASIX ) 80 MG tablet Take 1 tablet (80 mg total) by mouth daily as needed. For fluid 09/25/23   Lavell Bari A, FNP  glucose blood (GLUCOSE METER TEST) test strip Use BID 10/12/21   Lavell Bari A, FNP  hydrOXYzine  (VISTARIL ) 25 MG capsule Take 1 capsule (25 mg total)  by mouth every 8 (eight) hours as needed. 08/13/22   Lavell Bari LABOR, FNP  levothyroxine  (SYNTHROID ) 88 MCG tablet Take 1 tablet (88 mcg total) by mouth daily before breakfast. 09/25/23   Lavell Bari A, FNP  metFORMIN  (GLUCOPHAGE -XR) 750 MG 24 hr tablet Take 1 tablet (750 mg total) by mouth in the morning and at bedtime. 09/25/23   Lavell Bari A, FNP  ondansetron  (ZOFRAN -ODT) 4 MG disintegrating tablet Take 1 tablet (4 mg total) by mouth every 8 (eight) hours as needed for nausea or vomiting. 03/24/23   Larocco, Sarah C, FNP  pantoprazole  (PROTONIX ) 40 MG tablet Take 1 tablet (40 mg total) by mouth daily before breakfast. 09/25/23   Lavell Bari A, FNP  polyethylene glycol (MIRALAX ) 17 g packet Take 17 g by mouth daily. 04/23/22   Idol, Julie, PA-C  spironolactone  (ALDACTONE ) 25 MG tablet Take 1 tablet (25 mg total) by mouth daily. 09/23/23   Lavell Bari LABOR, FNP    Allergies: Buspar  [buspirone ], Lexapro  [escitalopram  oxalate], Trulicity [dulaglutide], Bactrim, Cephalexin, Chlorhexidine  gluconate, Clindamycin , Contrast media [iodinated contrast media], Crestor  [rosuvastatin ], Doxycycline, Latex, Levofloxacin, Nitrofurantoin monohyd macro, and Penicillins    Review of Systems  Cardiovascular:  Positive for chest pain.    Updated Vital Signs BP 117/66   Pulse 68   Temp 99.1 F (37.3 C) (Oral)   Resp 17   Ht 5' 2 (1.575 m)  Wt 72 kg   SpO2 98%   BMI 29.03 kg/m   Physical Exam  (all labs ordered are listed, but only abnormal results are displayed) Labs Reviewed  COMPREHENSIVE METABOLIC PANEL WITH GFR - Abnormal; Notable for the following components:      Result Value   Potassium 3.1 (*)    Chloride 97 (*)    Glucose, Bld 135 (*)    BUN 25 (*)    Creatinine, Ser 1.42 (*)    GFR, Estimated 37 (*)    All other components within normal limits  CBC WITH DIFFERENTIAL/PLATELET - Abnormal; Notable for the following components:   RBC 3.77 (*)    HCT 35.3 (*)    All other components  within normal limits  TROPONIN I (HIGH SENSITIVITY)  TROPONIN I (HIGH SENSITIVITY)    EKG: None  Radiology: DG Chest Port 1 View Result Date: 09/28/2023 CLINICAL DATA:  Defibrillator firing EXAM: PORTABLE CHEST 1 VIEW COMPARISON:  Chest radiograph dated 07/21/2022 FINDINGS: Lines/tubes: Left chest wall ICD leads project over the right atrium and ventricle and tributary of the coronary sinus. Lungs: Well inflated lungs. No focal consolidation. Pleura: No pneumothorax or pleural effusion. Linear interface at the lateral right apex, likely skin fold. Heart/mediastinum: Similar enlarged cardiomediastinal silhouette. Bones: No acute osseous abnormality. IMPRESSION: 1.  No focal consolidations. 2. Similar enlarged cardiomediastinal silhouette. Electronically Signed   By: Limin  Xu M.D.   On: 09/28/2023 14:00    {Document cardiac monitor, telemetry assessment procedure when appropriate:32947} Procedures   Medications Ordered in the ED  potassium chloride  SA (KLOR-CON  M) CR tablet 30 mEq (has no administration in time range)   I spoke with Dr. Inocencio, cardiology and he stated the patient can follow-up in the clinic this week and we will replace some potassium because her potassium 3.1   {Click here for ABCD2, HEART and other calculators REFRESH Note before signing:1}                              Medical Decision Making Amount and/or Complexity of Data Reviewed Radiology: ordered.  Risk Prescription drug management.    Firing of the defibrillator times once.  Patient will follow-up in the clinic  {Document critical care time when appropriate  Document review of labs and clinical decision tools ie CHADS2VASC2, etc  Document your independent review of radiology images and any outside records  Document your discussion with family members, caretakers and with consultants  Document social determinants of health affecting pt's care  Document your decision making why or why not admission,  treatments were needed:32947:::1}   Final diagnoses:  Pacemaker    ED Discharge Orders          Ordered    potassium chloride  SA (KLOR-CON  M) 10 MEQ tablet  2 times daily        09/28/23 1504

## 2023-09-28 NOTE — Discharge Instructions (Signed)
 Follow-up with your cardiologist in the next couple days.  They should call you for an appointment

## 2023-09-28 NOTE — ED Notes (Addendum)
 Attempted to interrogate pacemaker; st jude interrogator unable to scan. Attempted to call company to verify compatibility, but unable to reach someone. Provider notified.

## 2023-09-28 NOTE — ED Notes (Signed)
 Pacemaker is not interrogating. Several nurses have attempted and called support number with no answer.

## 2023-09-29 ENCOUNTER — Telehealth: Payer: Self-pay

## 2023-09-29 ENCOUNTER — Ambulatory Visit: Payer: Self-pay | Admitting: Family

## 2023-09-29 MED ORDER — FOSFOMYCIN TROMETHAMINE 3 G PO PACK: 3.0000 g | PACK | Freq: Once | ORAL | 0 refills | Status: AC

## 2023-09-29 NOTE — Telephone Encounter (Addendum)
 Device Transmission reviewed from 09/28/23:  Normal Device function - atrial lead noise - patient has a history No recorded shocks. Episodes recorded on 9/6 meeting criteria for AF DEVICE AT ERI.   Device triggered ERI at time 1-2pm on 09/27/23 (with patient notifier sent : alarm,vibration).- patient said she felt a jolt describes as a pressure/tremor she thought it was a shock.   Says she felt dizzy when event occurred and a little SOB but denies any symptoms since and is doing well.  She did go to the ER where they documented abnormal K level.   Appt made for follow up tomorrow: 9/9 with A. Tillery PA -C at 1020, directions to new building given to patient.

## 2023-09-29 NOTE — Telephone Encounter (Signed)
-----   Message from Will [facility] sent at 09/28/2023  3:00 PM EDT ----- Could not find the pool. Patient had a shock over the weekend and needs a call, thanks.

## 2023-09-30 ENCOUNTER — Ambulatory Visit: Attending: Student | Admitting: Student

## 2023-09-30 ENCOUNTER — Encounter: Payer: Self-pay | Admitting: Student

## 2023-09-30 VITALS — BP 118/54 | HR 69 | Ht 60.0 in | Wt 160.2 lb

## 2023-09-30 DIAGNOSIS — I5022 Chronic systolic (congestive) heart failure: Secondary | ICD-10-CM | POA: Insufficient documentation

## 2023-09-30 DIAGNOSIS — I493 Ventricular premature depolarization: Secondary | ICD-10-CM | POA: Insufficient documentation

## 2023-09-30 DIAGNOSIS — I480 Paroxysmal atrial fibrillation: Secondary | ICD-10-CM | POA: Diagnosis present

## 2023-09-30 DIAGNOSIS — Z95810 Presence of automatic (implantable) cardiac defibrillator: Secondary | ICD-10-CM | POA: Insufficient documentation

## 2023-09-30 LAB — CUP PACEART INCLINIC DEVICE CHECK
Battery Remaining Longevity: 0 mo
Brady Statistic RA Percent Paced: 89 %
Brady Statistic RV Percent Paced: 91 %
Date Time Interrogation Session: 20250909104342
HighPow Impedance: 74.25 Ohm
Implantable Lead Connection Status: 753985
Implantable Lead Connection Status: 753985
Implantable Lead Connection Status: 753985
Implantable Lead Implant Date: 20110912
Implantable Lead Implant Date: 20110912
Implantable Lead Implant Date: 20120710
Implantable Lead Location: 753858
Implantable Lead Location: 753859
Implantable Lead Location: 753860
Implantable Lead Model: 7122
Implantable Pulse Generator Implant Date: 20180621
Lead Channel Impedance Value: 337.5 Ohm
Lead Channel Impedance Value: 512.5 Ohm
Lead Channel Impedance Value: 625 Ohm
Lead Channel Pacing Threshold Amplitude: 0.625 V
Lead Channel Pacing Threshold Amplitude: 0.75 V
Lead Channel Pacing Threshold Amplitude: 0.75 V
Lead Channel Pacing Threshold Amplitude: 0.75 V
Lead Channel Pacing Threshold Pulse Width: 0.5 ms
Lead Channel Pacing Threshold Pulse Width: 0.5 ms
Lead Channel Pacing Threshold Pulse Width: 0.5 ms
Lead Channel Pacing Threshold Pulse Width: 0.5 ms
Lead Channel Sensing Intrinsic Amplitude: 12 mV
Lead Channel Sensing Intrinsic Amplitude: 2.4 mV
Lead Channel Setting Pacing Amplitude: 2 V
Lead Channel Setting Pacing Amplitude: 2 V
Lead Channel Setting Pacing Amplitude: 2 V
Lead Channel Setting Pacing Pulse Width: 0.5 ms
Lead Channel Setting Pacing Pulse Width: 0.5 ms
Lead Channel Setting Sensing Sensitivity: 0.5 mV
Pulse Gen Serial Number: 9761374
Zone Setting Status: 755011

## 2023-09-30 NOTE — Progress Notes (Signed)
  Electrophysiology Office Note:   ID:  Melissa Paul, Melissa Paul [birth year], MRN [id number]  Primary Cardiologist: Jayson Sierras, MD Electrophysiologist: Eulas FORBES Furbish, MD      History of Present Illness:   Melissa Paul is a 78 y.o. female with h/o CHFrEF s/p Abbott CRT-D, paroxysmal AF, and prior VT seen today for acute visit due to device at [facility].    Patient reports receiving vibratory notification over the weekend, which frightened her severely -> leading to assessment in ER. They were unable to get the monitor to work at ER, so told to follow up on Monday. We received remote which showed NO shock, but ERI reaching. Today, she denies chest pain, palpitations, dyspnea, PND, orthopnea, nausea, vomiting,  dizziness, syncope, edema, weight gain, or early satiety.   She is currently off Eliquis , awaiting manufacture assistance supply - and bridging with aspirin  in the interim.   Review of systems complete and found to be negative unless listed in HPI.   EP Information / Studies Reviewed:    EKG is not ordered today. EKG from 09/28/2023 reviewed which showed AV dual paced rhythm at 78 bpm       ICD Interrogation-  reviewed in detail today,  See PACEART report.  Arrhythmia/Device History Abbott BiV ICD implanted 09/2009, gen change 06/2016 for chronic systolic CHF   Physical Exam:   VS:  BP (!) 118/54   Pulse 69   Ht 5' (1.524 m)   Wt 160 lb 3.2 oz (72.7 kg)   SpO2 97%   BMI 31.29 kg/m    Wt Readings from Last 3 Encounters:  09/30/23 160 lb 3.2 oz (72.7 kg)  09/28/23 158 lb 11.7 oz (72 kg)  09/25/23 160 lb 9.6 oz (72.8 kg)     GEN: No acute distress  NECK: No JVD; No carotid bruits CARDIAC: Regular rate and rhythm, no murmurs, rubs, gallops RESPIRATORY:  Clear to auscultation without rales, wheezing or rhonchi  ABDOMEN: Soft, non-tender, non-distended EXTREMITIES:  No edema; No deformity   ASSESSMENT AND PLAN:    Chronic systolic CHF  s/p Abbott CRT-D  Echo 10/31/2022  showed LVEF 30% euvolemic today Stable on an appropriate medical regimen Normal ICD function See Pace Art report No changes today Known atrial lead noise leading to false AMS  Explained risks, benefits, and alternatives to gen change, including but not limited to bleeding and infection.  Pt verbalized understanding and agrees to proceed.      H/o VT 01/2019 Stable  Paroxysmal AF Continue Eliquis  5 mg BID - She is waiting on supply from manufacturer assistant. Will need to hold as usual for gen change.   Disposition:   Follow up with EP Team as usual post procedure   Signed, Ozell Prentice Passey, PA-C

## 2023-09-30 NOTE — Patient Instructions (Addendum)
 Medication Instructions:  Your physician recommends that you continue on your current medications as directed. Please refer to the Current Medication list given to you today.  *If you need a refill on your cardiac medications before your next appointment, please call your pharmacy*  Lab Work: BMET-TODAY If you have labs (blood work) drawn today and your tests are completely normal, you will receive your results only by: MyChart Message (if you have MyChart) OR A paper copy in the mail If you have any lab test that is abnormal or we need to change your treatment, we will call you to review the results.  Testing/Procedures: See letter  Follow-Up: At [facility], you and your health needs are our priority.  As part of our continuing mission to provide you with exceptional heart care, our providers are all part of one team.  This team includes your primary Cardiologist (physician) and Advanced Practice Providers or APPs (Physician Assistants and Nurse Practitioners) who all work together to provide you with the care you need, when you need it.  Your next appointment:   Follow up will be arranged for you and print out on your discharge summary after your procedure.  We recommend signing up for the patient portal called MyChart.  Sign up information is provided on this After Visit Summary.  MyChart is used to connect with patients for Virtual Visits (Telemedicine).  Patients are able to view lab/test results, encounter notes, upcoming appointments, etc.  Non-urgent messages can be sent to your provider as well.   To learn more about what you can do with MyChart, go to [email or web address].

## 2023-10-01 ENCOUNTER — Ambulatory Visit: Payer: Self-pay

## 2023-10-01 LAB — BASIC METABOLIC PANEL WITH GFR
BUN/Creatinine Ratio: 16 (ref 12–28)
BUN: 21 mg/dL (ref 8–27)
CO2: 23 mmol/L (ref 20–29)
Calcium: 9.2 mg/dL (ref 8.7–10.3)
Chloride: 96 mmol/L (ref 96–106)
Creatinine, Ser: 1.29 mg/dL — ABNORMAL HIGH (ref 0.57–1.00)
Glucose: 104 mg/dL — ABNORMAL HIGH (ref 70–99)
Potassium: 4.2 mmol/L (ref 3.5–5.2)
Sodium: 138 mmol/L (ref 134–144)
eGFR: 42 mL/min/1.73 — ABNORMAL LOW (ref >59)

## 2023-10-03 LAB — URINE CULTURE

## 2023-10-10 ENCOUNTER — Encounter: Payer: Self-pay | Admitting: Family

## 2023-10-10 ENCOUNTER — Ambulatory Visit (INDEPENDENT_AMBULATORY_CARE_PROVIDER_SITE_OTHER): Admitting: Family

## 2023-10-10 VITALS — BP 110/66 | HR 75 | Temp 97.6°F | Ht 60.0 in | Wt 163.6 lb

## 2023-10-10 DIAGNOSIS — I4901 Ventricular fibrillation: Secondary | ICD-10-CM

## 2023-10-10 DIAGNOSIS — Z95811 Presence of heart assist device: Secondary | ICD-10-CM

## 2023-10-10 DIAGNOSIS — N1832 Chronic kidney disease, stage 3b: Secondary | ICD-10-CM | POA: Diagnosis not present

## 2023-10-10 DIAGNOSIS — Z09 Encounter for follow-up examination after completed treatment for conditions other than malignant neoplasm: Secondary | ICD-10-CM

## 2023-10-10 DIAGNOSIS — E876 Hypokalemia: Secondary | ICD-10-CM

## 2023-10-10 DIAGNOSIS — E0822 Diabetes mellitus due to underlying condition with diabetic chronic kidney disease: Secondary | ICD-10-CM

## 2023-10-10 DIAGNOSIS — M150 Primary generalized (osteo)arthritis: Secondary | ICD-10-CM

## 2023-10-10 MED ORDER — TRAMADOL HCL 50 MG PO TABS: 50.0000 mg | ORAL_TABLET | Freq: Two times a day (BID) | ORAL | 0 refills | Status: AC | PRN

## 2023-10-10 NOTE — Patient Instructions (Signed)
 Hypokalemia Hypokalemia means that the amount of potassium in the blood is lower than normal. Potassium is a mineral (electrolyte) that helps regulate the amount of fluid in the body. It also stimulates muscle tightening (contraction) and helps nerves work properly. Normally, most of the body's potassium is inside cells, and only a very small amount is in the blood. Because the amount in the blood is so small, minor changes to potassium levels in the blood can be life-threatening. What are the causes? This condition may be caused by: Antibiotic medicine. Diarrhea or vomiting. Taking too much of a medicine that helps you have a bowel movement (laxative) can cause diarrhea and lead to hypokalemia. Chronic kidney disease (CKD). Medicines that help the body get rid of excess fluid (diuretics). Eating disorders, such as anorexia or bulimia. Low magnesium levels in the body. Sweating a lot. What are the signs or symptoms? Symptoms of this condition include: Weakness. Constipation. Fatigue. Muscle cramps. Mental confusion. Skipped heartbeats or irregular heartbeat (palpitations). Tingling or numbness. How is this diagnosed? This condition is diagnosed with a blood test. How is this treated? This condition may be treated by: Taking potassium supplements. Adjusting the medicines that you take. Eating more foods that contain a lot of potassium. If your potassium level is very low, you may need to get potassium through an IV and be monitored in the hospital. Follow these instructions at home: Eating and drinking  Eat a healthy diet. A healthy diet includes fresh fruits and vegetables, whole grains, healthy fats, and lean proteins. If told, eat more foods that contain a lot of potassium. These include: Nuts, such as peanuts and pistachios. Seeds, such as sunflower seeds and pumpkin seeds. Peas, lentils, and lima beans. Whole grain and bran cereals and breads. Fresh fruits and vegetables,  such as apricots, avocado, bananas, cantaloupe, kiwi, oranges, tomatoes, asparagus, and potatoes. Juices, such as orange, tomato, and prune. Lean meats, including fish. Milk and milk products, such as yogurt. General instructions Take over-the-counter and prescription medicines only as told by your health care provider. This includes vitamins, natural food products, and supplements. Keep all follow-up visits. This is important. Contact a health care provider if: You have weakness that gets worse. You feel your heart pounding or racing. You vomit. You have diarrhea. You have diabetes and you have trouble keeping your blood sugar in your target range. Get help right away if: You have chest pain. You have shortness of breath. You have vomiting or diarrhea that lasts for more than 2 days. You faint. These symptoms may be an emergency. Get help right away. Call 911. Do not wait to see if the symptoms will go away. Do not drive yourself to the hospital. Summary Hypokalemia means that the amount of potassium in the blood is lower than normal. This condition is diagnosed with a blood test. Hypokalemia may be treated by taking potassium supplements, adjusting the medicines that you take, or eating more foods that are high in potassium. If your potassium level is very low, you may need to get potassium through an IV and be monitored in the hospital. This information is not intended to replace advice given to you by your health care provider. Make sure you discuss any questions you have with your health care provider. Document Revised: 09/21/2020 Document Reviewed: 09/21/2020 Elsevier Patient Education  2024 [employer].

## 2023-10-10 NOTE — Progress Notes (Signed)
 Subjective:    Patient ID: Melissa Paul, female    DOB: [birth year], 78 y.o.   MRN: [id number]  Chief Complaint  Patient presents with   2 WK FOLLOW UP    HYPOTENSION   Urinary Tract Infection    STILL HAVING UTI SYMPTOMS   PT presents to the office today today to recheck hypotension. She was seen on 09/25/23 and we held her spironolactone .   She went to the ED on 09/28/23 because her pacemaker fired. She is followed by Cardiologists and has an appointment 10/27/23 to have it replaced.   She was also found to be hypokalemic. She was given potassium chloride  10 meq BID for 9 days.   She continues to have UTI symptoms. She was given  Fosfomycin. She has an appointment with Urologists next week. She has multiple drug allergies.   She have CKD and avoids NSAID's. Stable.  Diabetes She presents for her follow-up diabetic visit. She has type 2 diabetes mellitus. Associated symptoms include foot paresthesias. Pertinent negatives for diabetes include no blurred vision. Symptoms are stable. Diabetic complications include peripheral neuropathy. Risk factors for coronary artery disease include dyslipidemia, diabetes mellitus, hypertension, sedentary lifestyle and post-menopausal. She is following a generally healthy diet. Her overall blood glucose range is 110-130 mg/dl.  Arthritis Presents for follow-up visit. Affected locations include the right knee and left knee (back). Her pain is at a severity of 8/10.      Review of Systems  Eyes:  Negative for blurred vision.  Musculoskeletal:  Positive for arthritis.  All other systems reviewed and are negative.   Social History   Socioeconomic History   Marital status: Widowed    Spouse name: Not on file   Number of children: 2   Years of education: Not on file   Highest education level: Not on file  Occupational History   Not on file  Tobacco Use   Smoking status: Former    Current packs/day: 0.00    Average packs/day: 0.3 packs/day  for 46.5 years (13.9 ttl pk-yrs)    Types: Cigarettes    Start date: 08/03/1949    Quit date: 01/22/1996    Years since quitting: 27.7    Passive exposure: Never   Smokeless tobacco: Never  Vaping Use   Vaping status: Never Used  Substance and Sexual Activity   Alcohol use: No    Alcohol/week: 0.0 standard drinks of alcohol   Drug use: No   Sexual activity: Not Currently    Birth control/protection: Surgical    Comment: hyst  Other Topics Concern   Not on file  Social History Narrative   Her daughter, Maurilio lives with her - takes her to all visits and cooks for her.   Social Drivers of Corporate investment banker Strain: Low Risk  (09/12/2023)   Overall Financial Resource Strain (CARDIA)    Difficulty of Paying Living Expenses: Not hard at all  Food Insecurity: No Food Insecurity (09/12/2023)   Hunger Vital Sign    Worried About Running Out of Food in the Last Year: Never true    Ran Out of Food in the Last Year: Never true  Transportation Needs: No Transportation Needs (09/12/2023)   PRAPARE - Administrator, Civil Service (Medical): No    Lack of Transportation (Non-Medical): No  Physical Activity: Sufficiently Active (09/12/2023)   Exercise Vital Sign    Days of Exercise per Week: 7 days    Minutes of Exercise per Session:  30 min  Stress: No Stress Concern Present (09/12/2023)   [employer] of Occupational Health - Occupational Stress Questionnaire    Feeling of Stress: Not at all  Social Connections: Socially Isolated (09/12/2023)   Social Connection and Isolation Panel    Frequency of Communication with Friends and Family: More than three times a week    Frequency of Social Gatherings with Friends and Family: More than three times a week    Attends Religious Services: Never    Database administrator or Organizations: No    Attends Banker Meetings: Never    Marital Status: Widowed   Family History  Problem Relation Age of Onset   CVA Mother         multiple   Heart Problems Mother        weak heart   Heart disease Maternal Grandmother    Heart disease Maternal Grandfather    Cancer Father        Stomach and eshpogus   Diabetes Neg Hx    Hypertension Neg Hx    Coronary artery disease Neg Hx         Objective:   Physical Exam Vitals reviewed.  Constitutional:      General: She is not in acute distress.    Appearance: She is well-developed. She is obese.  HENT:     Head: Normocephalic and atraumatic.     Right Ear: Tympanic membrane normal.     Left Ear: Tympanic membrane normal.  Eyes:     Pupils: Pupils are equal, round, and reactive to light.  Neck:     Thyroid : No thyromegaly.  Cardiovascular:     Rate and Rhythm: Normal rate and regular rhythm.     Heart sounds: Normal heart sounds. No murmur heard. Pulmonary:     Effort: Pulmonary effort is normal. No respiratory distress.     Breath sounds: Normal breath sounds. No wheezing.  Abdominal:     General: Bowel sounds are normal. There is no distension.     Palpations: Abdomen is soft.     Tenderness: There is no abdominal tenderness.  Musculoskeletal:        General: No tenderness. Normal range of motion.     Cervical back: Normal range of motion and neck supple.     Right lower leg: Edema (trace) present.     Left lower leg: Edema (trace) present.  Skin:    General: Skin is warm and dry.  Neurological:     Mental Status: She is alert and oriented to person, place, and time.     Cranial Nerves: No cranial nerve deficit.     Motor: Weakness present.     Gait: Gait abnormal (using cane).     Deep Tendon Reflexes: Reflexes are normal and symmetric.  Psychiatric:        Behavior: Behavior normal.        Thought Content: Thought content normal.        Judgment: Judgment normal.       BP 110/66   Pulse 75   Temp 97.6 F (36.4 C)   Ht 5' (1.524 m)   Wt 163 lb 9.6 oz (74.2 kg)   SpO2 96%   BMI 31.95 kg/m      Assessment & Plan:  Melissa Paul comes in today with chief complaint of 2 WK FOLLOW UP (HYPOTENSION) and Urinary Tract Infection (STILL HAVING UTI SYMPTOMS)   Diagnosis and orders addressed:  1. Presence  of heart assist device (HCC) (Primary) - CMP14+EGFR  2. Ventricular fibrillation (HCC) - CMP14+EGFR  3. Stage 3b chronic kidney disease (HCC) - CMP14+EGFR  4. Diabetes mellitus due to underlying condition with stage 3b chronic kidney disease, without long-term current use of insulin  (HCC) - CMP14+EGFR  5. Hypokalemia - CMP14+EGFR  6. Hospital discharge follow-up - CMP14+EGFR  7. Primary osteoarthritis involving multiple joints - traMADol  (ULTRAM ) 50 MG tablet; Take 1 tablet (50 mg total) by mouth every 12 (twelve) hours as needed.  Dispense: 30 tablet; Refill: 0 - CMP14+EGFR   Labs pending Keep follow up with Cardiologists and Urologists Will start Ultram  as needed for pain May need to restart oral potassium based on labs  Health Maintenance reviewed Diet and exercise encouraged  Return in about 3 months (around 01/09/2024), or if symptoms worsen or fail to improve.    [name], FNP

## 2023-10-11 LAB — CMP14+EGFR
ALT: 9 IU/L (ref 0–32)
AST: 13 IU/L (ref 0–40)
Albumin: 4.4 g/dL (ref 3.8–4.8)
Alkaline Phosphatase: 64 IU/L (ref 49–135)
BUN/Creatinine Ratio: 19 (ref 12–28)
BUN: 27 mg/dL (ref 8–27)
Bilirubin Total: 0.4 mg/dL (ref 0.0–1.2)
CO2: 26 mmol/L (ref 20–29)
Calcium: 8.8 mg/dL (ref 8.7–10.3)
Chloride: 97 mmol/L (ref 96–106)
Creatinine, Ser: 1.43 mg/dL — ABNORMAL HIGH (ref 0.57–1.00)
Globulin, Total: 2.1 g/dL (ref 1.5–4.5)
Glucose: 83 mg/dL (ref 70–99)
Potassium: 4 mmol/L (ref 3.5–5.2)
Sodium: 139 mmol/L (ref 134–144)
Total Protein: 6.5 g/dL (ref 6.0–8.5)
eGFR: 37 mL/min/1.73 — ABNORMAL LOW (ref >59)

## 2023-10-13 ENCOUNTER — Inpatient Hospital Stay: Attending: Hematology

## 2023-10-13 ENCOUNTER — Ambulatory Visit: Payer: Self-pay | Admitting: Family

## 2023-10-13 ENCOUNTER — Ambulatory Visit (INDEPENDENT_AMBULATORY_CARE_PROVIDER_SITE_OTHER): Payer: Medicare Other

## 2023-10-13 VITALS — BP 123/73 | HR 96 | Temp 98.1°F | Resp 20

## 2023-10-13 DIAGNOSIS — I5022 Chronic systolic (congestive) heart failure: Secondary | ICD-10-CM

## 2023-10-13 DIAGNOSIS — E538 Deficiency of other specified B group vitamins: Secondary | ICD-10-CM | POA: Insufficient documentation

## 2023-10-13 DIAGNOSIS — D509 Iron deficiency anemia, unspecified: Secondary | ICD-10-CM

## 2023-10-13 MED ORDER — CYANOCOBALAMIN 1000 MCG/ML IJ SOLN
1000.0000 ug | Freq: Once | INTRAMUSCULAR | Status: AC
  Administered 2023-10-13: 1000 ug via INTRAMUSCULAR
  Filled 2023-10-13: qty 1

## 2023-10-13 NOTE — Progress Notes (Signed)
 Patient tolerated injection with no complaints voiced.  Site clean and dry with no bruising or swelling noted at site.  See MAR for details.  Band aid applied.  Patient stable during and after injection.  Vss with discharge and left in satisfactory condition with no s/s of distress noted.

## 2023-10-13 NOTE — Progress Notes (Unsigned)
 History of Present Illness: Melissa Paul is a 78 y.o. year old female here for cysto. She has had intractable OAB as well as frequent UTIS.  She is on Vagifem  that she takes/uses twice a week.  She recently was treated for UTI with fosfomycin by her PCP, Melissa Paul.  She feels like she may have a developing UTI now.  Typical symptoms include bladder pain, vaginal pain, frequency and worsened urgency.  No gross hematuria.  She has urinary frequency and urgency and significant urgency incontinence.  She is on Gemtesa .  Past Medical History:  Diagnosis Date   Anxiety    Chronic systolic heart failure (HCC)    Constipation    Cyst of right kidney 11/12/2017   Glucose intolerance (pre-diabetes)    Hyperlipemia    Hypothyroidism    LBBB (left bundle branch block)    Mitral regurgitation    Mild to Moderate   Nonischemic cardiomyopathy (HCC)    LVEF 35-40% by echo 8/13   Paroxysmal atrial fibrillation (HCC)    Brief episodes by device interrogation   UTI (urinary tract infection)    Recurrent   Ventricular tachycardia (HCC)    ICD therapy for VT    Past Surgical History:  Procedure Laterality Date   APPENDECTOMY     BIV ICD GENERATOR CHANGEOUT N/A 07/11/2016   Procedure: BiV ICD Generator Changeout;  Surgeon: Melissa Agent, MD;  Location: [facility] INVASIVE CV LAB;  Service: Cardiovascular;  Laterality: N/A;   CARDIAC DEFIBRILLATOR PLACEMENT  09/2009   St. Jude BiV ICD by Dr. Darleen class Paul   CHOLECYSTECTOMY     has one ovary left     KNEE ARTHROSCOPY     Left   LEFT AND RIGHT HEART CATHETERIZATION WITH CORONARY ANGIOGRAM N/A 04/12/2014   Procedure: LEFT AND RIGHT HEART CATHETERIZATION WITH CORONARY ANGIOGRAM;  Surgeon: Melissa LELON Clay, MD;  Location: [facility] CATH LAB;  Service: Cardiovascular;  Laterality: N/A;   Left breast biopsy x 2     no maliganancy   PACEMAKER IMPLANT     RIGHT HEART CATH N/A 10/27/2020   Procedure: RIGHT HEART CATH;  Surgeon: Melissa Ezra GORMAN, MD;   Location: [facility] INVASIVE CV LAB;  Service: Cardiovascular;  Laterality: N/A;   RIGHT/LEFT HEART CATH AND CORONARY ANGIOGRAPHY N/A 03/26/2019   Procedure: RIGHT/LEFT HEART CATH AND CORONARY ANGIOGRAPHY;  Surgeon: Melissa Ezra GORMAN, MD;  Location: [facility] INVASIVE CV LAB;  Service: Cardiovascular;  Laterality: N/A;   TOTAL ABDOMINAL HYSTERECTOMY     (R) ovary removed    Home Medications:  (Not in a hospital admission)   Allergies:  Allergies  Allergen Reactions   Buspar  [Buspirone ] Other (See Comments)    Patient states that she became hyper and had lots of itching.   Lexapro  [Escitalopram  Oxalate] Other (See Comments)    Patient states that she became hyper and lots of itching   Trulicity [Dulaglutide] Hives and Itching    Tongue was numb.   Bactrim Rash   Cephalexin Swelling and Rash    Says she is able to take cefuroxime  Throat swelling   Chlorhexidine  Gluconate Hives and Itching    Hives, itching after using wipes with last procedure   Clindamycin  Rash        Contrast Media [Iodinated Contrast Media] Rash   Crestor  [Rosuvastatin ] Hives and Rash   Doxycycline Rash   Latex Rash        Levofloxacin Rash        Nitrofurantoin Monohyd Macro Rash  Penicillins Swelling and Rash    Has patient had a PCN reaction causing immediate rash, facial/tongue/throat swelling, SOB or lightheadedness with hypotension: Yes Has patient had a PCN reaction causing severe rash involving mucus membranes or skin necrosis: Yes Has patient had a PCN reaction that required hospitalization: No Has patient had a PCN reaction occurring within the last 10 years: No Rash/swelled tongue If all of the above answers are NO, then may proceed with Cephalosporin use.     Family History  Problem Relation Age of Onset   CVA Mother        multiple   Heart Problems Mother        weak heart   Heart disease Maternal Grandmother    Heart disease Maternal Grandfather    Cancer Father        Stomach and  eshpogus   Diabetes Neg Hx    Hypertension Neg Hx    Coronary artery disease Neg Hx     Social History:  reports that she quit smoking about 27 years ago. Her smoking use included cigarettes. She started smoking about 74 years ago. She has a 13.9 pack-year smoking history. She has never been exposed to tobacco smoke. She has never used smokeless tobacco. She reports that she does not drink alcohol and does not use drugs.  ROS: A complete review of systems was performed.  All systems are negative except for pertinent findings as noted.  Physical Exam:  Vital signs in last 24 hours: @VSRANGES @ General:  Alert and oriented, No acute distress HEENT: Normocephalic, atraumatic Neck: No JVD or lymphadenopathy Cardiovascular: Regular rate  Lungs: Normal inspiratory/expiratory excursion Abdomen: Soft, nontender, nondistended, no abdominal masses Back: No CVA tenderness Extremities: No edema Neurologic: Grossly intact  I have reviewed prior pt notes from urology  I have reviewed urinalysis results-bacteria and white blood cells present  I have independently reviewed prior imaging.  CT results reviewed  I have reviewed prior urine cultures     Impression/Assessment:  1.  Chronic UTIs and 78 year old female.  In the past she has been on suppression with trimethoprim .  Recently treated with fosfomycin for UTI.  She does have urine consistent with developing UTI  2.  Vaginal atrophic changes, on Vagifem  twice a week  3.  Overactive bladder-wet, on Gemtesa  with persistent symptoms/leakage.  She does have mobility restrictions  Plan:  1.  At this time, I think it is worth putting her on methenamine  hippurate 1 g twice a day-morning and evening as well as a dose of vitamin C in between  2.  Add probiotic daily for long-term  3.  Continue vaginal estrogen, but the patient would like to switch from Vagifem  to estrogen cream.  4.  Urine culture today, currently no antibiotic given  5.   Office visit next available for check  6.  I do not think we have to do cystoscopy at this time  Melissa Paul 10/13/2023, 6:56 PM  Melissa CHRISTELLA. Melissa Bautch MD

## 2023-10-13 NOTE — Patient Instructions (Signed)

## 2023-10-14 ENCOUNTER — Ambulatory Visit (INDEPENDENT_AMBULATORY_CARE_PROVIDER_SITE_OTHER): Admitting: Urology

## 2023-10-14 VITALS — BP 111/67 | HR 86

## 2023-10-14 DIAGNOSIS — Z87440 Personal history of urinary (tract) infections: Secondary | ICD-10-CM

## 2023-10-14 DIAGNOSIS — L900 Lichen sclerosus et atrophicus: Secondary | ICD-10-CM

## 2023-10-14 DIAGNOSIS — N3941 Urge incontinence: Secondary | ICD-10-CM | POA: Diagnosis not present

## 2023-10-14 DIAGNOSIS — N952 Postmenopausal atrophic vaginitis: Secondary | ICD-10-CM | POA: Diagnosis not present

## 2023-10-14 DIAGNOSIS — N390 Urinary tract infection, site not specified: Secondary | ICD-10-CM

## 2023-10-14 DIAGNOSIS — N3281 Overactive bladder: Secondary | ICD-10-CM

## 2023-10-14 DIAGNOSIS — N3946 Mixed incontinence: Secondary | ICD-10-CM

## 2023-10-14 LAB — CUP PACEART REMOTE DEVICE CHECK
Battery Remaining Longevity: 0 mo
Battery Voltage: 2.59 V
Brady Statistic AP VP Percent: 91 %
Brady Statistic AP VS Percent: 2.7 %
Brady Statistic AS VP Percent: 2.4 %
Brady Statistic AS VS Percent: 2.7 %
Brady Statistic RA Percent Paced: 92 %
Date Time Interrogation Session: 20250922223532
HighPow Impedance: 69 Ohm
HighPow Impedance: 69 Ohm
Implantable Lead Connection Status: 753985
Implantable Lead Connection Status: 753985
Implantable Lead Connection Status: 753985
Implantable Lead Implant Date: 20110912
Implantable Lead Implant Date: 20110912
Implantable Lead Implant Date: 20120710
Implantable Lead Location: 753858
Implantable Lead Location: 753859
Implantable Lead Location: 753860
Implantable Lead Model: 7122
Implantable Pulse Generator Implant Date: 20180621
Lead Channel Impedance Value: 330 Ohm
Lead Channel Impedance Value: 490 Ohm
Lead Channel Impedance Value: 530 Ohm
Lead Channel Pacing Threshold Amplitude: 0.75 V
Lead Channel Pacing Threshold Amplitude: 0.875 V
Lead Channel Pacing Threshold Amplitude: 0.875 V
Lead Channel Pacing Threshold Pulse Width: 0.5 ms
Lead Channel Pacing Threshold Pulse Width: 0.5 ms
Lead Channel Pacing Threshold Pulse Width: 0.5 ms
Lead Channel Sensing Intrinsic Amplitude: 12 mV
Lead Channel Sensing Intrinsic Amplitude: 2.4 mV
Lead Channel Setting Pacing Amplitude: 2 V
Lead Channel Setting Pacing Amplitude: 2 V
Lead Channel Setting Pacing Amplitude: 2 V
Lead Channel Setting Pacing Pulse Width: 0.5 ms
Lead Channel Setting Pacing Pulse Width: 0.5 ms
Lead Channel Setting Sensing Sensitivity: 0.5 mV
Pulse Gen Serial Number: 9761374
Zone Setting Status: 755011

## 2023-10-14 LAB — MICROSCOPIC EXAMINATION

## 2023-10-14 LAB — URINALYSIS, ROUTINE W REFLEX MICROSCOPIC
Bilirubin, UA: NEGATIVE
Glucose, UA: NEGATIVE
Ketones, UA: NEGATIVE
Nitrite, UA: NEGATIVE
Protein,UA: NEGATIVE
RBC, UA: NEGATIVE
Specific Gravity, UA: <1.005 — ABNORMAL LOW (ref 1.005–1.030)
Urobilinogen, Ur: 0.2 mg/dL (ref 0.2–1.0)
pH, UA: 6.5 (ref 5.0–7.5)

## 2023-10-14 MED ORDER — METHENAMINE HIPPURATE 1 G PO TABS: 1.0000 g | ORAL_TABLET | Freq: Two times a day (BID) | ORAL | 11 refills | Status: DC

## 2023-10-14 MED ORDER — ESTRADIOL 0.1 MG/GM VA CREA
TOPICAL_CREAM | VAGINAL | 3 refills | Status: AC
Start: 2023-10-14 — End: ?

## 2023-10-14 NOTE — Addendum Note (Signed)
 Addended by: MATILDA SENIOR on: 10/14/2023 11:55 AM   Modules accepted: Orders

## 2023-10-14 NOTE — Addendum Note (Signed)
 Addended by: MATILDA SENIOR on: 10/14/2023 11:55 AM   Modules accepted: Level of Service

## 2023-10-15 NOTE — Progress Notes (Signed)
Remote ICD Transmission.

## 2023-10-17 LAB — URINE CULTURE

## 2023-10-21 ENCOUNTER — Ambulatory Visit: Payer: Self-pay

## 2023-10-21 NOTE — Telephone Encounter (Signed)
-----   Message from Garnette HERO Dahlstedt sent at 10/19/2023  6:19 PM EDT ----- Let pt know that a very common bacteria grew out on culture. No new abx needed-->just keep on new med regimen ----- Message ----- From: Interface, [facility] Lab Results In Sent: 10/14/2023   3:36 PM EDT To: Garnette Shack, MD

## 2023-10-21 NOTE — Telephone Encounter (Signed)
 Called pt to give MD recommendations pt voiced her understanding

## 2023-10-22 ENCOUNTER — Telehealth: Payer: Self-pay | Admitting: Cardiovascular Disease

## 2023-10-22 NOTE — Telephone Encounter (Signed)
 Pt would like a c/b regarding whether she needs to have lab work done before Generator change on 10/6. Please advise

## 2023-10-22 NOTE — Telephone Encounter (Signed)
 Spoke with pt and advised pt should not need additional labs as she completed a CBC on 9/7 and last CMP on 9/19.  Pt verbalizes understanding and thanked Charity fundraiser for the callback.

## 2023-10-24 ENCOUNTER — Telehealth (HOSPITAL_COMMUNITY): Payer: Self-pay

## 2023-10-24 NOTE — Telephone Encounter (Signed)
 Spoke with patient's daughter, Maurilio to discuss upcoming procedure/DPR on file.   Confirmed patient is scheduled for a ICD generator change on Monday, October 6 with Dr. Eulas Furbish. Instructed patient to arrive at the Main Entrance A at [facility]: [address] [place], [place] [address] and check in at Admitting at 12:30 PM.   Labs completed  Any recent signs of acute illness or been started on antibiotics? No  Any new medications started? Yes; Methenamine , Probiotic, Vitamin C by urologist.  Any medications to hold? Yes.  HOLD: Eliquis  (Apixaban ) for 2 day(s) prior to your procedure. Your last dose will be Friday, October 3, PM dose.  HOLD: Aspirin  for 2 day(s) prior to your procedure. Your last dose will be Friday, October 3, PM dose.  HOLD Metformin  and Lasix  on the morning of your procedure. Medication instructions:  On the morning of your procedure take all other morning medications not discussed with a small sip of water. You may have a LIGHT breakfast the morning of your procedure before 6:30 am.  Nothing to eat or drink after 6:30 am.  The night before your procedure and the morning of your procedure, wash thoroughly with the CHG surgical soap from the neck down, paying special attention to the area where your procedure will be performed.  Advised of plan to go home the same day and will only stay overnight if medically necessary. You MUST have a responsible adult to drive you home and MUST be with you the first 24 hours after you arrive home.  Informed a nurse will call a day before the procedure to confirm arrival time and ensure instructions are followed.  Patient's daughter verbalized understanding to all instructions provided and agreed to proceed with procedure.

## 2023-10-26 NOTE — Pre-Procedure Instructions (Signed)
 Attempted to call patient regarding procedure instructions.  No answer and mailbox not set up.  Attempted to call daughter, service not active.  Below are the following instructions.    Arrival time 1230  Nothing to eat or drink after midnight No meds AM of procedure Responsible person to drive you home and stay with you for 24 hrs Wash with special soap night before and morning of procedure If on anti-coagulant drug instructions Eliquis  last dose 10/3

## 2023-10-27 ENCOUNTER — Ambulatory Visit (HOSPITAL_COMMUNITY)
Admission: RE | Admit: 2023-10-27 | Discharge: 2023-10-27 | Disposition: A | Discharge status: Home / Self Care | Attending: Cardiovascular Disease | Admitting: Cardiovascular Disease

## 2023-10-27 ENCOUNTER — Other Ambulatory Visit: Payer: Self-pay

## 2023-10-27 ENCOUNTER — Ambulatory Visit (HOSPITAL_COMMUNITY)
Admission: RE | Disposition: A | Discharge status: Home / Self Care | Payer: Self-pay | Source: Home / Self Care | Attending: Cardiovascular Disease

## 2023-10-27 DIAGNOSIS — Z7901 Long term (current) use of anticoagulants: Secondary | ICD-10-CM | POA: Insufficient documentation

## 2023-10-27 DIAGNOSIS — Z79899 Other long term (current) drug therapy: Secondary | ICD-10-CM | POA: Insufficient documentation

## 2023-10-27 DIAGNOSIS — Z8679 Personal history of other diseases of the circulatory system: Secondary | ICD-10-CM | POA: Diagnosis not present

## 2023-10-27 DIAGNOSIS — Z4502 Encounter for adjustment and management of automatic implantable cardiac defibrillator: Secondary | ICD-10-CM | POA: Insufficient documentation

## 2023-10-27 DIAGNOSIS — I480 Paroxysmal atrial fibrillation: Secondary | ICD-10-CM | POA: Insufficient documentation

## 2023-10-27 DIAGNOSIS — I5022 Chronic systolic (congestive) heart failure: Secondary | ICD-10-CM | POA: Diagnosis not present

## 2023-10-27 HISTORY — PX: BIV ICD GENERATOR CHANGEOUT: EP1194

## 2023-10-27 LAB — GLUCOSE, CAPILLARY: Glucose-Capillary: 96 mg/dL (ref 70–99)

## 2023-10-27 SURGERY — BIV ICD GENERATOR CHANGEOUT

## 2023-10-27 MED ORDER — SODIUM CHLORIDE 0.9 % IV SOLN: INTRAVENOUS | Status: DC

## 2023-10-27 MED ORDER — LIDOCAINE HCL 1 % IJ SOLN
INTRAMUSCULAR | Status: AC
  Filled 2023-10-27: qty 60

## 2023-10-27 MED ORDER — POVIDONE-IODINE 10 % EX SWAB: 2.0000 | Freq: Once | CUTANEOUS | Status: DC

## 2023-10-27 MED ORDER — SODIUM CHLORIDE 0.9 % IV SOLN
INTRAVENOUS | Status: AC
  Filled 2023-10-27: qty 2

## 2023-10-27 MED ORDER — VANCOMYCIN HCL 1000 MG IV SOLR
INTRAVENOUS | Status: AC | PRN
  Administered 2023-10-27: 1000 mg via INTRAVENOUS

## 2023-10-27 MED ORDER — GENTAMICIN SULFATE 40 MG/ML IJ SOLN: 80.0000 mg | INTRAMUSCULAR | Status: DC

## 2023-10-27 MED ORDER — VANCOMYCIN HCL IN DEXTROSE 1-5 GM/200ML-% IV SOLN: 1000.0000 mg | INTRAVENOUS | Status: DC

## 2023-10-27 MED ORDER — FENTANYL CITRATE (PF) 100 MCG/2ML IJ SOLN
INTRAMUSCULAR | Status: AC
  Filled 2023-10-27: qty 2

## 2023-10-27 MED ORDER — MIDAZOLAM HCL 2 MG/2ML IJ SOLN
INTRAMUSCULAR | Status: AC
  Filled 2023-10-27: qty 2

## 2023-10-27 MED ORDER — FENTANYL CITRATE (PF) 100 MCG/2ML IJ SOLN
INTRAMUSCULAR | Status: DC | PRN
  Administered 2023-10-27: 50 ug via INTRAVENOUS

## 2023-10-27 MED ORDER — MIDAZOLAM HCL 5 MG/5ML IJ SOLN
INTRAMUSCULAR | Status: DC | PRN
  Administered 2023-10-27: 1 mg via INTRAVENOUS

## 2023-10-27 MED ORDER — APIXABAN 5 MG PO TABS: 5.0000 mg | ORAL_TABLET | Freq: Two times a day (BID) | ORAL | Status: AC

## 2023-10-27 MED ORDER — VANCOMYCIN HCL IN DEXTROSE 1-5 GM/200ML-% IV SOLN
INTRAVENOUS | Status: AC
  Filled 2023-10-27: qty 200

## 2023-10-27 MED ORDER — SODIUM CHLORIDE 0.9 % IV SOLN
INTRAVENOUS | Status: DC | PRN
  Administered 2023-10-27: 80 mg

## 2023-10-27 MED ORDER — LIDOCAINE HCL (PF) 1 % IJ SOLN
INTRAMUSCULAR | Status: DC | PRN
  Administered 2023-10-27: 30 mL

## 2023-10-27 SURGICAL SUPPLY — 5 items
CABLE SURGICAL S-101-97-12 (CABLE) ×1 IMPLANT
ICD GALLANT HF CRT-D DF-4 IS-1 (ICD Generator) IMPLANT
PAD DEFIB RADIO PHYSIO CONN (PAD) ×1 IMPLANT
POUCH AIGIS-R ANTIBACT ICD LRG (Mesh General) IMPLANT
TRAY PACEMAKER INSERTION (PACKS) ×1 IMPLANT

## 2023-10-27 NOTE — H&P (Signed)
  Electrophysiology Office Note:    Date:  10/27/2023   ID:  Melissa Paul, DOB 08-Apr-1945, MRN [id number]  PCP:  Lavell Bari LABOR, FNP   [facility] Providers Cardiologist:  Jayson Sierras, MD Electrophysiologist:  Eulas FORBES Furbish, MD     Referring MD: No ref. provider found   History of Present Illness:    Melissa Paul is a 78 y.o. 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     She has a Abbott CRT D device in place.  He has had VT/VF in the past.  Additionally, she has a diagnosed with atrial fibrillation  Noted to have atrial lead noise in the past, so her atrial sensing threshold was increased.  On interrogation today, she has under sensed beats.  She has also had inappropriate atrial mode switch episodes because she is still symptomatic noise occasionally.     Since her last visit, she has been doing reasonably well.  She does have occasional palpitations --irregular beats, and sensation of heart racing briefly.  Her primary complaint is fatigue, generally feeling unwell, nausea.  She also has had some hacking cough.  No fever.  She presents today for generator change.    EKGs/Labs/Other Studies Reviewed Today:    Echocardiogram:  TTE October 2023 Ejection fraction 25 to 30%.  Grade 2 diastolic dysfunction.  Left atrium moderately dilated.  TTE October 2024 Ejection fraction 30%.  Grade 2 diastolic dysfunction.  Left atrium mildly dilated.  Monitors:   Stress testing:   Advanced imaging:   Cardiac catherization    EKG:         Physical Exam:    VS:  BP 121/75   Pulse 77   Temp 97.7 F (36.5 C) (Oral)   Resp 18   Ht 5' 2.5 (1.588 m)   Wt 70.8 kg   SpO2 96%   BMI 28.08 kg/m     Wt Readings from Last 3 Encounters:  10/27/23 70.8 kg  10/10/23 74.2 kg  09/30/23 72.7 kg     GEN: Well nourished, well developed in no acute distress CARDIAC:  RRR, no murmurs, rubs, gallops The device site is normal -- no tenderness, edema, drainage, redness, threatened erosion.  RESPIRATORY:  Normal work of breathing MUSCULOSKELETAL: 1+ edema    ASSESSMENT & PLAN:    Systolic heart failure CRT D device in place On carvedilol  12.5, dapagliflozin  10, spironolactone  25 EF 30% on most recent echo, October 2024  Abbott CRT-D Known atrial lead noise leading to false AMS We previously increased her AV sensitivity addressed with programming Is not device dependent today. She presents today for generator change as her battery is at [facility]. I explained the risks and benefits. She would like to proceed. Specific risks include damage to existing leads, infection, bleeding. The risk of serious or life-threatening complications is very low but not zero.  Paroxysmal atrial fibrillation Short episodes noted on interrogation On Eliquis  for CHA2DS2-VASc score of 6 10/1 Labs reviewed   Malaise, fatigue Suspect anemia for which she is currently under management No dark stools, Dempsey GI bleeding   Signed, Eulas FORBES Furbish, MD  10/27/2023 1:34 PM    [facility]

## 2023-10-27 NOTE — Discharge Instructions (Signed)

## 2023-10-27 NOTE — Progress Notes (Signed)
 Provider in the room to see the patient. Verbal order to discharge home.

## 2023-10-28 ENCOUNTER — Encounter (HOSPITAL_COMMUNITY): Payer: Self-pay | Admitting: Cardiovascular Disease

## 2023-10-28 MED FILL — Midazolam HCl Inj 2 MG/2ML (Base Equivalent): INTRAMUSCULAR | Qty: 1 | Status: AC

## 2023-11-03 ENCOUNTER — Encounter

## 2023-11-03 NOTE — Progress Notes (Signed)
 ICM transmission rescheduled for 12/08/2023 to allow development of Corvue Thoracic impedance following device battery change on 10/27/2023.

## 2023-11-06 ENCOUNTER — Ambulatory Visit: Attending: Student in an Organized Health Care Education/Training Program

## 2023-11-06 DIAGNOSIS — I5022 Chronic systolic (congestive) heart failure: Secondary | ICD-10-CM | POA: Diagnosis present

## 2023-11-06 LAB — CUP PACEART INCLINIC DEVICE CHECK
Battery Remaining Longevity: 84 mo
Brady Statistic RA Percent Paced: 85 %
Brady Statistic RV Percent Paced: 88 %
Date Time Interrogation Session: 20251016153715
HighPow Impedance: 56.25 Ohm
Implantable Lead Connection Status: 753985
Implantable Lead Connection Status: 753985
Implantable Lead Connection Status: 753985
Implantable Lead Implant Date: 20110912
Implantable Lead Implant Date: 20110912
Implantable Lead Implant Date: 20120710
Implantable Lead Location: 753858
Implantable Lead Location: 753859
Implantable Lead Location: 753860
Implantable Lead Model: 7122
Implantable Pulse Generator Implant Date: 20251006
Lead Channel Impedance Value: 287.5 Ohm
Lead Channel Impedance Value: 437.5 Ohm
Lead Channel Impedance Value: 487.5 Ohm
Lead Channel Pacing Threshold Amplitude: 0.75 V
Lead Channel Pacing Threshold Amplitude: 0.75 V
Lead Channel Pacing Threshold Amplitude: 0.75 V
Lead Channel Pacing Threshold Amplitude: 0.75 V
Lead Channel Pacing Threshold Amplitude: 0.75 V
Lead Channel Pacing Threshold Amplitude: 0.75 V
Lead Channel Pacing Threshold Pulse Width: 0.5 ms
Lead Channel Pacing Threshold Pulse Width: 0.5 ms
Lead Channel Pacing Threshold Pulse Width: 0.5 ms
Lead Channel Pacing Threshold Pulse Width: 0.5 ms
Lead Channel Pacing Threshold Pulse Width: 0.5 ms
Lead Channel Pacing Threshold Pulse Width: 0.5 ms
Lead Channel Sensing Intrinsic Amplitude: 11.7 mV
Lead Channel Sensing Intrinsic Amplitude: 2.1 mV
Lead Channel Setting Pacing Amplitude: 1.125
Lead Channel Setting Pacing Amplitude: 2 V
Lead Channel Setting Pacing Amplitude: 2 V
Lead Channel Setting Pacing Pulse Width: 0.5 ms
Lead Channel Setting Pacing Pulse Width: 0.5 ms
Lead Channel Setting Sensing Sensitivity: 0.5 mV
Pulse Gen Serial Number: 211057461
Zone Setting Status: 755011

## 2023-11-06 NOTE — Progress Notes (Signed)
 Normal multi chamber ICD wound check post generator change. Wound well healed. Presenting rhythm: AP/BP 74. Routine testing performed.  Thresholds, sensing, and impedance demonstrate stable parameters and no programming changes needed. No treated arrhythmias. Estimated longevity 7-7.3 years. Reviewed shock plan.  Pt enrolled in remote follow-up.  Pt c/o not feeling as well since her generator change.  Will evaluate programming to ensure Pt optimally programmed and follow up.

## 2023-11-06 NOTE — Patient Instructions (Signed)

## 2023-11-07 ENCOUNTER — Other Ambulatory Visit (HOSPITAL_BASED_OUTPATIENT_CLINIC_OR_DEPARTMENT_OTHER): Payer: Self-pay

## 2023-11-07 ENCOUNTER — Encounter: Payer: Self-pay | Admitting: Oncology

## 2023-11-12 ENCOUNTER — Inpatient Hospital Stay: Attending: Hematology

## 2023-11-12 VITALS — BP 110/76 | HR 87 | Temp 96.4°F | Resp 18

## 2023-11-12 DIAGNOSIS — Z79899 Other long term (current) drug therapy: Secondary | ICD-10-CM | POA: Diagnosis not present

## 2023-11-12 DIAGNOSIS — D509 Iron deficiency anemia, unspecified: Secondary | ICD-10-CM

## 2023-11-12 DIAGNOSIS — E538 Deficiency of other specified B group vitamins: Secondary | ICD-10-CM | POA: Insufficient documentation

## 2023-11-12 MED ORDER — CYANOCOBALAMIN 1000 MCG/ML IJ SOLN
1000.0000 ug | Freq: Once | INTRAMUSCULAR | Status: AC
  Administered 2023-11-12: 1000 ug via INTRAMUSCULAR
  Filled 2023-11-12: qty 1

## 2023-11-12 NOTE — Progress Notes (Signed)
 Melissa Paul presents today for injection per the provider's orders.  B12 administration without incident; injection site WNL; see MAR for injection details.  Patient tolerated procedure well and without incident.  No questions or concerns noted. Discharged from clinic by wheel chair in stable condition. Alert and oriented x 3. F/U with [facility] as scheduled.

## 2023-11-12 NOTE — Patient Instructions (Signed)
 [facility] [place] - A DEPT OF [facility]  Discharge Instructions: Thank you for choosing [facility] to provide your oncology and hematology care.  If you have a lab appointment with the Cancer Center - please note that after April 8th, 2024, all labs will be drawn in the cancer center.  You do not have to check in or register with the main entrance as you have in the past but will complete your check-in in the cancer center.  Wear comfortable clothing and clothing appropriate for easy access to any Portacath or PICC line.   We strive to give you quality time with your provider. You may need to reschedule your appointment if you arrive late (15 or more minutes).  Arriving late affects you and other patients whose appointments are after yours.  Also, if you miss three or more appointments without notifying the office, you may be dismissed from the clinic at the provider's discretion.      For prescription refill requests, have your pharmacy contact our office and allow 72 hours for refills to be completed.    Today you received the following chemotherapy and/or immunotherapy agents B12 injection Vitamin B12 Injection What is this medication? Vitamin B12 (VAHY tuh min B12) prevents and treats low vitamin B12 levels in your body. It is used in people who do not get enough vitamin B12 from their diet or when their digestive tract does not absorb enough. Vitamin B12 plays an important role in maintaining the health of your nervous system and red blood cells. This medicine may be used for other purposes; ask your health care provider or pharmacist if you have questions. COMMON BRAND NAME(S): B-12 Compliance Kit, B-12 Injection Kit, Cyomin, Dodex, LA-12, Nutri-Twelve, Physicians EZ Use B-12, Primabalt, Vitamin Deficiency Injectable System - B12 What should I tell my care team before I take this medication? They need to know if you have any of these  conditions: Kidney disease Leber's disease Megaloblastic anemia An unusual or allergic reaction to cyanocobalamin, cobalt, other medications, foods, dyes, or preservatives Pregnant or trying to get pregnant Breast-feeding How should I use this medication? This medication is injected into a muscle or deeply under the skin. It is usually given in a clinic or care team's office. However, your care team may teach you how to inject yourself. Follow all instructions. Talk to your care team about the use of this medication in children. Special care may be needed. Overdosage: If you think you have taken too much of this medicine contact a poison control center or emergency room at once. NOTE: This medicine is only for you. Do not share this medicine with others. What if I miss a dose? If you are given your dose at a clinic or care team's office, call to reschedule your appointment. If you give your own injections, and you miss a dose, take it as soon as you can. If it is almost time for your next dose, take only that dose. Do not take double or extra doses. What may interact with this medication? Alcohol Colchicine This list may not describe all possible interactions. Give your health care provider a list of all the medicines, herbs, non-prescription drugs, or dietary supplements you use. Also tell them if you smoke, drink alcohol, or use illegal drugs. Some items may interact with your medicine. What should I watch for while using this medication? Visit your care team regularly. You may need blood work done while  you are taking this medication. You may need to follow a special diet. Talk to your care team. Limit your alcohol intake and avoid smoking to get the best benefit. What side effects may I notice from receiving this medication? Side effects that you should report to your care team as soon as possible: Allergic reactions--skin rash, itching, hives, swelling of the face, lips, tongue, or  throat Swelling of the ankles, hands, or feet Trouble breathing Side effects that usually do not require medical attention (report to your care team if they continue or are bothersome): Diarrhea This list may not describe all possible side effects. Call your doctor for medical advice about side effects. You may report side effects to FDA at 1-800-FDA-1088. Where should I keep my medication? Keep out of the reach of children. Store at room temperature between 15 and 30 degrees C (59 and 85 degrees F). Protect from light. Throw away any unused medication after the expiration date. NOTE: This sheet is a summary. It may not cover all possible information. If you have questions about this medicine, talk to your doctor, pharmacist, or health care provider.  2024 Elsevier/Gold Standard (2020-09-19 00:00:00)       To help prevent nausea and vomiting after your treatment, we encourage you to take your nausea medication as directed.  BELOW ARE SYMPTOMS THAT SHOULD BE REPORTED IMMEDIATELY: *FEVER GREATER THAN 100.4 F (38 C) OR HIGHER *CHILLS OR SWEATING *NAUSEA AND VOMITING THAT IS NOT CONTROLLED WITH YOUR NAUSEA MEDICATION *UNUSUAL SHORTNESS OF BREATH *UNUSUAL BRUISING OR BLEEDING *URINARY PROBLEMS (pain or burning when urinating, or frequent urination) *BOWEL PROBLEMS (unusual diarrhea, constipation, pain near the anus) TENDERNESS IN MOUTH AND THROAT WITH OR WITHOUT PRESENCE OF ULCERS (sore throat, sores in mouth, or a toothache) UNUSUAL RASH, SWELLING OR PAIN  UNUSUAL VAGINAL DISCHARGE OR ITCHING   Items with * indicate a potential emergency and should be followed up as soon as possible or go to the Emergency Department if any problems should occur.  Please show the CHEMOTHERAPY ALERT CARD or IMMUNOTHERAPY ALERT CARD at check-in to the Emergency Department and triage nurse.  Should you have questions after your visit or need to cancel or reschedule your appointment, please contact [facility] CAN[facility]  CTR  - A DEPT OF [facility] [phone]  and follow the prompts.  Office hours are 8:00 a.m. to 4:30 p.m. Monday - Friday. Please note that voicemails left after 4:00 p.m. may not be returned until the following business day.  We are closed weekends and major holidays. You have access to a nurse at all times for urgent questions. Please call the main number to the clinic [phone] and follow the prompts.  For any non-urgent questions, you may also contact your provider using MyChart. We now offer e-Visits for anyone 68 and older to request care online for non-urgent symptoms. For details visit [email or web address].   Also download the MyChart app! Go to the app store, search "MyChart", open the app, select [place], and log in with your MyChart username and password.

## 2023-11-17 ENCOUNTER — Ambulatory Visit: Payer: Self-pay | Admitting: Cardiovascular Disease

## 2023-11-28 ENCOUNTER — Encounter: Payer: Medicare Other | Admitting: Cardiovascular Disease

## 2023-12-08 ENCOUNTER — Inpatient Hospital Stay: Attending: Hematology

## 2023-12-08 ENCOUNTER — Ambulatory Visit: Attending: Cardiovascular Disease

## 2023-12-08 ENCOUNTER — Inpatient Hospital Stay

## 2023-12-08 DIAGNOSIS — D509 Iron deficiency anemia, unspecified: Secondary | ICD-10-CM | POA: Diagnosis present

## 2023-12-08 DIAGNOSIS — I5022 Chronic systolic (congestive) heart failure: Secondary | ICD-10-CM | POA: Insufficient documentation

## 2023-12-08 DIAGNOSIS — Z87891 Personal history of nicotine dependence: Secondary | ICD-10-CM | POA: Insufficient documentation

## 2023-12-08 DIAGNOSIS — G479 Sleep disorder, unspecified: Secondary | ICD-10-CM | POA: Insufficient documentation

## 2023-12-08 DIAGNOSIS — Z91041 Radiographic dye allergy status: Secondary | ICD-10-CM | POA: Insufficient documentation

## 2023-12-08 DIAGNOSIS — R0789 Other chest pain: Secondary | ICD-10-CM | POA: Diagnosis not present

## 2023-12-08 DIAGNOSIS — R0609 Other forms of dyspnea: Secondary | ICD-10-CM | POA: Diagnosis not present

## 2023-12-08 DIAGNOSIS — K649 Unspecified hemorrhoids: Secondary | ICD-10-CM | POA: Diagnosis not present

## 2023-12-08 DIAGNOSIS — R42 Dizziness and giddiness: Secondary | ICD-10-CM | POA: Insufficient documentation

## 2023-12-08 DIAGNOSIS — R5383 Other fatigue: Secondary | ICD-10-CM | POA: Insufficient documentation

## 2023-12-08 DIAGNOSIS — R519 Headache, unspecified: Secondary | ICD-10-CM | POA: Insufficient documentation

## 2023-12-08 DIAGNOSIS — Z8249 Family history of ischemic heart disease and other diseases of the circulatory system: Secondary | ICD-10-CM | POA: Insufficient documentation

## 2023-12-08 DIAGNOSIS — Z95810 Presence of automatic (implantable) cardiac defibrillator: Secondary | ICD-10-CM | POA: Diagnosis not present

## 2023-12-08 DIAGNOSIS — Z79899 Other long term (current) drug therapy: Secondary | ICD-10-CM | POA: Diagnosis not present

## 2023-12-08 DIAGNOSIS — Z90710 Acquired absence of both cervix and uterus: Secondary | ICD-10-CM | POA: Insufficient documentation

## 2023-12-08 DIAGNOSIS — E669 Obesity, unspecified: Secondary | ICD-10-CM | POA: Insufficient documentation

## 2023-12-08 DIAGNOSIS — R110 Nausea: Secondary | ICD-10-CM | POA: Diagnosis not present

## 2023-12-08 DIAGNOSIS — Z9049 Acquired absence of other specified parts of digestive tract: Secondary | ICD-10-CM | POA: Insufficient documentation

## 2023-12-08 DIAGNOSIS — Z87440 Personal history of urinary (tract) infections: Secondary | ICD-10-CM | POA: Diagnosis not present

## 2023-12-08 DIAGNOSIS — R0602 Shortness of breath: Secondary | ICD-10-CM | POA: Diagnosis not present

## 2023-12-08 DIAGNOSIS — Z7901 Long term (current) use of anticoagulants: Secondary | ICD-10-CM | POA: Insufficient documentation

## 2023-12-08 DIAGNOSIS — Z800 Family history of malignant neoplasm of digestive organs: Secondary | ICD-10-CM | POA: Insufficient documentation

## 2023-12-08 DIAGNOSIS — R200 Anesthesia of skin: Secondary | ICD-10-CM | POA: Diagnosis not present

## 2023-12-08 DIAGNOSIS — N1831 Chronic kidney disease, stage 3a: Secondary | ICD-10-CM | POA: Insufficient documentation

## 2023-12-08 DIAGNOSIS — E538 Deficiency of other specified B group vitamins: Secondary | ICD-10-CM | POA: Diagnosis present

## 2023-12-08 DIAGNOSIS — Z604 Social exclusion and rejection: Secondary | ICD-10-CM | POA: Insufficient documentation

## 2023-12-08 DIAGNOSIS — I428 Other cardiomyopathies: Secondary | ICD-10-CM | POA: Diagnosis not present

## 2023-12-08 DIAGNOSIS — Z91040 Latex allergy status: Secondary | ICD-10-CM | POA: Insufficient documentation

## 2023-12-08 DIAGNOSIS — E039 Hypothyroidism, unspecified: Secondary | ICD-10-CM | POA: Diagnosis not present

## 2023-12-08 DIAGNOSIS — Z882 Allergy status to sulfonamides status: Secondary | ICD-10-CM | POA: Insufficient documentation

## 2023-12-08 DIAGNOSIS — R059 Cough, unspecified: Secondary | ICD-10-CM | POA: Insufficient documentation

## 2023-12-08 DIAGNOSIS — Z888 Allergy status to other drugs, medicaments and biological substances status: Secondary | ICD-10-CM | POA: Insufficient documentation

## 2023-12-08 DIAGNOSIS — Z90721 Acquired absence of ovaries, unilateral: Secondary | ICD-10-CM | POA: Insufficient documentation

## 2023-12-08 DIAGNOSIS — D500 Iron deficiency anemia secondary to blood loss (chronic): Secondary | ICD-10-CM

## 2023-12-08 DIAGNOSIS — Z881 Allergy status to other antibiotic agents status: Secondary | ICD-10-CM | POA: Insufficient documentation

## 2023-12-08 DIAGNOSIS — Z880 Allergy status to penicillin: Secondary | ICD-10-CM | POA: Insufficient documentation

## 2023-12-08 DIAGNOSIS — I480 Paroxysmal atrial fibrillation: Secondary | ICD-10-CM | POA: Insufficient documentation

## 2023-12-08 DIAGNOSIS — Z823 Family history of stroke: Secondary | ICD-10-CM | POA: Insufficient documentation

## 2023-12-08 LAB — CBC WITH DIFFERENTIAL/PLATELET
Abs Immature Granulocytes: 0.03 K/uL (ref 0.00–0.07)
Basophils Absolute: 0 K/uL (ref 0.0–0.1)
Basophils Relative: 1 %
Eosinophils Absolute: 0.1 K/uL (ref 0.0–0.5)
Eosinophils Relative: 1 %
HCT: 35.7 % — ABNORMAL LOW (ref 36.0–46.0)
Hemoglobin: 12.4 g/dL (ref 12.0–15.0)
Immature Granulocytes: 1 %
Lymphocytes Relative: 43 %
Lymphs Abs: 2.6 K/uL (ref 0.7–4.0)
MCH: 32.5 pg (ref 26.0–34.0)
MCHC: 34.7 g/dL (ref 30.0–36.0)
MCV: 93.7 fL (ref 80.0–100.0)
Monocytes Absolute: 0.6 K/uL (ref 0.1–1.0)
Monocytes Relative: 11 %
Neutro Abs: 2.7 K/uL (ref 1.7–7.7)
Neutrophils Relative %: 43 %
Platelets: 194 K/uL (ref 150–400)
RBC: 3.81 MIL/uL — ABNORMAL LOW (ref 3.87–5.11)
RDW: 12.8 % (ref 11.5–15.5)
WBC: 6.1 K/uL (ref 4.0–10.5)
nRBC: 0 % (ref 0.0–0.2)

## 2023-12-08 LAB — FERRITIN: Ferritin: 177 ng/mL (ref 11–307)

## 2023-12-08 LAB — IRON AND TIBC
Iron: 104 ug/dL (ref 28–170)
Saturation Ratios: 35 % — ABNORMAL HIGH (ref 10.4–31.8)
TIBC: 300 ug/dL (ref 250–450)
UIBC: 196 ug/dL

## 2023-12-08 LAB — VITAMIN B12: Vitamin B-12: 670 pg/mL (ref 180–914)

## 2023-12-08 NOTE — Progress Notes (Signed)
 EPIC Encounter for ICM Monitoring  Patient Name: Melissa Paul is a 78 y.o. female Date: 12/08/2023 Primary Care Physican: Lavell Bari LABOR, FNP Primary Cardiologist: [place]Lean Electrophysiologist: Mealor Bi-V Pacing: 90%        01/01/2022 Weight: 178 lbs 01/27/2023 Weight: 163 lbs   02/03/2023 Weight: 165 lbs 03/05/2023 Weight: 163 lbs (up 4 lbs) 06/25/2023 Weight: 152-155 (was 187 lbs) 09/23/2023 Weight:  150-155 lbs   AT/AF Burden: 3.4%            Attempted call to patient and unable to reach. Transmission results reviewed.     Since 10/27/2023 battery replacement: CorVue Thoracic impedance suggesting intermittent days with possible dryness.   Prescribed:  Furosemide  80 mg Take 1 tablet (80 mg total) by mouth daily as needed.  Pt takes extra if needed.   06/25/22 She is taking about twice a week due to her body can't handle Furosemide  daily.   Spirolactone 25 mg take 1 tablet by mouth daily   Labs: 10/10/2023 Creatinine 1.43, BUN 27, Potassium 4.0, Sodium 139, GFR 37  09/30/2023 Creatinine 1.29, BUN 21, Potassium 4.2, Sodium 138, GFR 42  09/28/2023 Creatinine 1.42, BUN 25, Potassium 3.1, Sodium 138  A complete set of results can be found in Results Review.   Recommendations:   Unable to reach.     Follow-up plan: ICM clinic phone appointment on 01/13/2024.   91 day device clinic remote transmission 01/12/2024.    EP/Cardiology Office Visits:  02/13/2024 with Dr Nancey.  Recall 02/02/2024 with Dr Debera.   Aware needs to call Dr Orvilla office for appointment.  Recall 04/03/2022 with Dr Rolan (last visit 12/04/2021)   Copy of ICM check sent to Dr. Nancey.   Remote monitoring is medically necessary for Heart Failure Management.    Daily Thoracic Impedance ICM trend: 10/27/2023 through 12/08/2023.    12-14 Month Thoracic Impedance ICM trend:     Melissa GORMAN Garner, RN 12/08/2023 7:38 AM

## 2023-12-13 NOTE — Progress Notes (Unsigned)
 [facility] 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  She was last seen by Melissa Barefoot PA-C on 06/10/2023.  She had new pacemaker put in on 10/27/2023, but reports feeling weaker since this time. At today's visit, she reports feeling fair. She reports about 80% energy, but with 70% appetite. She has ongoing fatigue, headaches, and dizziness, which was unimproved by previous IV iron.   She has intermittent chest pain and dyspnea on exertion attributed to her CHF (following with cardiology).  She previously had reported moderate to severe hemorrhoid bleeding, but has not had any episodes of hematochezia for the past 6+ months.  No melena or epistaxis.  ASSESSMENT & PLAN:  1.  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  Vitamin B12 low at 133/elevated MMA 1611.  Normal folate and copper .  - Labs from January 2025 showed normal SPEP/immunofixation with normal FLC ratio.  Normal LDH.  Creatinine with baseline CKD stage IIIa/b. - Reports history of blood transfusion 20+ years ago - Unable to tolerate oral iron due to nausea/vomiting - Last EGD was 20+ years ago.  Reports that she is unable to go under for future EGD/colonoscopy due to her CHF. - Most recent IV iron with Monoferric  x 1 g on  03/10/2023 (PREMEDICATION due to multiple medication allergies) - Failed to improve on oral B12.  Monthly B12 injection since January 2025.   - Reports moderate to severe hemorrhoid bleeding occurring about once a month.  Denies melena. - Most recent labs (12/08/2023): Hgb 12.4/MCV 93.7 Iron deficiency: Ferritin 177, iron saturation 35%, TIBC 300 Vitamin B12 improved at 670, with MMA pending - Chronic multifactorial fatigue - PLAN: No indication for IV iron at this time. - Patient would like to try witching to vitamin B12 tablets 1,000 mcg daily, rather than monthly injections. - Recommended referral to GI for hemorrhoid banding.  Patient declines at this time. - Labs and RTC in 1 year, or sooner if needed   2.  Social/Family History: - Lives at home with daughter. Independent of ADL's and dependent of IADL's. Quit tobacco use 25 years ago.  - No family history of anemia. Father had stomach cancer.    PLAN SUMMARY:  (D/C monthly B12 injections after today) >> Labs in 1 year = CBC/D, ferritin, iron/TIBC, B12, MMA  >> OFFICE visit in 1 year (1 week after labs)     REVIEW OF SYSTEMS:   Review of Systems  Constitutional:  Positive for appetite change and fatigue. Negative for chills, diaphoresis, fever and unexpected weight change.  HENT:   Negative for lump/mass and nosebleeds.   Eyes:  Negative for eye problems.  Respiratory:  Positive for cough and shortness of breath. Negative for hemoptysis.   Cardiovascular:  Positive for chest pain and palpitations. Negative for leg swelling.  Gastrointestinal:  Positive for nausea. Negative for abdominal pain, blood in stool, constipation, diarrhea and vomiting.  Genitourinary:  Negative for difficulty urinating and hematuria.   Musculoskeletal:  Negative for flank pain.  Skin: Negative.   Neurological:  Positive for dizziness, headaches and numbness. Negative for light-headedness.  Hematological:  Does not bruise/bleed easily.   Psychiatric/Behavioral:  Positive for sleep disturbance.      PHYSICAL EXAM:  ECOG PERFORMANCE STATUS: 2 - Symptomatic, <50% confined to bed  Vitals:   12/15/23 1044  BP: 111/60  Pulse: 70  Resp: 17  Temp: 98.5 F (36.9 C)  SpO2: 98%    Filed Weights   12/15/23 1044  Weight: 156 lb 11.2 oz (71.1 kg)    Physical Exam Constitutional:      Appearance: Normal appearance. She is obese.  Cardiovascular:     Heart sounds: Normal heart sounds.  Pulmonary:     Breath sounds: Normal breath sounds.  Neurological:     General: No focal deficit present.     Mental Status: Mental status is at baseline.  Psychiatric:        Behavior: Behavior normal. Behavior is cooperative.    PAST MEDICAL/SURGICAL HISTORY:  Past Medical History:  Diagnosis Date   Anxiety    Chronic systolic heart failure (HCC)    Constipation    Cyst of right kidney 11/12/2017   Glucose intolerance (pre-diabetes)    Hyperlipemia    Hypothyroidism    LBBB (left bundle branch block)    Mitral regurgitation    Mild to Moderate   Nonischemic cardiomyopathy (HCC)    LVEF 35-40% by echo 8/13   Paroxysmal atrial fibrillation (HCC)    Brief episodes by device interrogation   UTI (urinary tract infection)    Recurrent   Ventricular tachycardia (HCC)    ICD therapy for VT   Past Surgical History:  Procedure Laterality Date   APPENDECTOMY     BIV ICD GENERATOR CHANGEOUT N/A 07/11/2016   Procedure: BiV ICD Generator Changeout;  Surgeon: Kelsie Agent, MD;  Location: [facility] INVASIVE CV LAB;  Service: Cardiovascular;  Laterality: N/A;   BIV ICD GENERATOR CHANGEOUT N/A 10/27/2023   Procedure: BIV ICD GENERATOR CHANGEOUT;  Surgeon: Nancey, Eulas BRAVO, MD;  Location: [facility] INVASIVE CV LAB;  Service: Cardiovascular;  Laterality: N/A;   CARDIAC DEFIBRILLATOR PLACEMENT  09/2009   St. Jude BiV ICD by Dr. Darleen class III   CHOLECYSTECTOMY     has one ovary left     KNEE ARTHROSCOPY     Left   LEFT AND RIGHT HEART  CATHETERIZATION WITH CORONARY ANGIOGRAM N/A 04/12/2014   Procedure: LEFT AND RIGHT HEART CATHETERIZATION WITH CORONARY ANGIOGRAM;  Surgeon: Alm LELON Clay, MD;  Location: [facility] CATH LAB;  Service: Cardiovascular;  Laterality: N/A;   Left breast biopsy x 2     no maliganancy   PACEMAKER IMPLANT     RIGHT HEART CATH N/A 10/27/2020   Procedure: RIGHT HEART CATH;  Surgeon: Rolan Ezra RAMAN, MD;  Location: [facility] INVASIVE CV LAB;  Service: Cardiovascular;  Laterality: N/A;   RIGHT/LEFT HEART CATH AND CORONARY ANGIOGRAPHY N/A 03/26/2019   Procedure: RIGHT/LEFT HEART CATH AND CORONARY ANGIOGRAPHY;  Surgeon: Rolan Ezra RAMAN, MD;  Location: [facility] INVASIVE CV LAB;  Service: Cardiovascular;  Laterality: N/A;   TOTAL ABDOMINAL HYSTERECTOMY     (R) ovary removed    SOCIAL HISTORY:  Social History   Socioeconomic History   Marital status: Widowed    Spouse name: Not on file  Number of children: 2   Years of education: Not on file   Highest education level: Not on file  Occupational History   Not on file  Tobacco Use   Smoking status: Former    Current packs/day: 0.00    Average packs/day: 0.3 packs/day for 46.5 years (13.9 ttl pk-yrs)    Types: Cigarettes    Start date: 08/03/1949    Quit date: 01/22/1996    Years since quitting: 27.9    Passive exposure: Never   Smokeless tobacco: Never  Vaping Use   Vaping status: Never Used  Substance and Sexual Activity   Alcohol use: No    Alcohol/week: 0.0 standard drinks of alcohol   Drug use: No   Sexual activity: Not Currently    Birth control/protection: Surgical    Comment: hyst  Other Topics Concern   Not on file  Social History Narrative   Her daughter, Maurilio lives with her - takes her to all visits and cooks for her.   Social Drivers of Corporate Investment Banker Strain: Low Risk  (09/12/2023)   Overall Financial Resource Strain (CARDIA)    Difficulty of Paying Living Expenses: Not hard at all  Food Insecurity: No Food Insecurity (09/12/2023)    Hunger Vital Sign    Worried About Running Out of Food in the Last Year: Never true    Ran Out of Food in the Last Year: Never true  Transportation Needs: No Transportation Needs (09/12/2023)   PRAPARE - Administrator, Civil Service (Medical): No    Lack of Transportation (Non-Medical): No  Physical Activity: Sufficiently Active (09/12/2023)   Exercise Vital Sign    Days of Exercise per Week: 7 days    Minutes of Exercise per Session: 30 min  Stress: No Stress Concern Present (09/12/2023)   [employer] of Occupational Health - Occupational Stress Questionnaire    Feeling of Stress: Not at all  Social Connections: Socially Isolated (09/12/2023)   Social Connection and Isolation Panel    Frequency of Communication with Friends and Family: More than three times a week    Frequency of Social Gatherings with Friends and Family: More than three times a week    Attends Religious Services: Never    Database Administrator or Organizations: No    Attends Banker Meetings: Never    Marital Status: Widowed  Intimate Partner Violence: Not At Risk (09/12/2023)   Humiliation, Afraid, Rape, and Kick questionnaire    Fear of Current or Ex-Partner: No    Emotionally Abused: No    Physically Abused: No    Sexually Abused: No    FAMILY HISTORY:  Family History  Problem Relation Age of Onset   CVA Mother        multiple   Heart Problems Mother        weak heart   Heart disease Maternal Grandmother    Heart disease Maternal Grandfather    Cancer Father        Stomach and eshpogus   Diabetes Neg Hx    Hypertension Neg Hx    Coronary artery disease Neg Hx     CURRENT MEDICATIONS:  Outpatient Encounter Medications as of 12/15/2023  Medication Sig Note   albuterol  (PROVENTIL ) (2.5 MG/3ML) 0.083% nebulizer solution Take 3 mLs (2.5 mg total) by nebulization every 6 (six) hours as needed for wheezing or shortness of breath.    apixaban  (ELIQUIS ) 5 MG TABS tablet Take  1 tablet (5  mg total) by mouth 2 (two) times daily.    ascorbic acid (VITAMIN C) 500 MG tablet Take 500 mg by mouth daily.    blood glucose meter kit and supplies 1 each by Other route 2 (two) times daily as needed for other. Dispense based on patient and insurance preference. . (FOR ICD-10 E10.9, E11.9).Dispense based on patient and insurance preference.    carvedilol  (COREG ) 12.5 MG tablet Take 1 tablet (12.5 mg total) by mouth 2 (two) times daily.    cyanocobalamin  (VITAMIN B12) 1000 MCG tablet Take 1 tablet (1,000 mcg total) by mouth daily.    diphenhydrAMINE  (BENADRYL ) 25 MG tablet Take 25 mg by mouth at bedtime as needed for allergies.    estradiol  (ESTRACE ) 0.1 MG/GM vaginal cream Apply a pea-sized amount to vaginal opening using index finger 3 nights/week    fosfomycin (MONUROL ) 3 g PACK Take 3 g by mouth daily as needed (Bladder infection).    furosemide  (LASIX ) 80 MG tablet Take 1 tablet (80 mg total) by mouth daily as needed. For fluid (Patient taking differently: Take 80 mg by mouth daily as needed for fluid or edema. For fluid)    glucose blood (GLUCOSE METER TEST) test strip Use BID    hydrOXYzine  (VISTARIL ) 25 MG capsule Take 1 capsule (25 mg total) by mouth every 8 (eight) hours as needed. (Patient taking differently: Take 25 mg by mouth every 8 (eight) hours as needed for itching.)    levothyroxine  (SYNTHROID ) 88 MCG tablet Take 1 tablet (88 mcg total) by mouth daily before breakfast.    metFORMIN  (GLUCOPHAGE -XR) 750 MG 24 hr tablet Take 1 tablet (750 mg total) by mouth in the morning and at bedtime.    methenamine  (HIPREX ) 1 g tablet Take 1 tablet (1 g total) by mouth 2 (two) times daily with a meal. 10/22/2023: Have not started   ondansetron  (ZOFRAN -ODT) 4 MG disintegrating tablet Take 1 tablet (4 mg total) by mouth every 8 (eight) hours as needed for nausea or vomiting.    pantoprazole  (PROTONIX ) 40 MG tablet Take 1 tablet (40 mg total) by mouth daily before breakfast.     polyethylene glycol (MIRALAX ) 17 g packet Take 17 g by mouth daily. (Patient taking differently: Take 17 g by mouth once a week.)    potassium chloride  (KLOR-CON ) 10 MEQ tablet Take 30 mEq by mouth 2 (two) times daily.    Probiotic Product (PROBIOTIC PO) Take 1 capsule by mouth daily.    No facility-administered encounter medications on file as of 12/15/2023.    ALLERGIES:  Allergies  Allergen Reactions   Buspar  [Buspirone ] Other (See Comments)    Patient states that she became hyper and had lots of itching.   Lexapro  [Escitalopram  Oxalate] Other (See Comments)    Patient states that she became hyper and lots of itching   Trulicity [Dulaglutide] Hives and Itching    Tongue was numb.   Bactrim Rash   Cephalexin Swelling and Rash    Says she is able to take cefuroxime  Throat swelling   Chlorhexidine  Gluconate Hives and Itching    Hives, itching after using wipes with last procedure   Clindamycin  Rash        Contrast Media [Iodinated Contrast Media] Rash   Crestor  [Rosuvastatin ] Hives and Rash   Doxycycline Rash   Latex Rash        Levofloxacin Rash        Nitrofurantoin Monohyd Macro Rash        Penicillins Swelling and Rash  Has patient had a PCN reaction causing immediate rash, facial/tongue/throat swelling, SOB or lightheadedness with hypotension: Yes Has patient had a PCN reaction causing severe rash involving mucus membranes or skin necrosis: Yes Has patient had a PCN reaction that required hospitalization: No Has patient had a PCN reaction occurring within the last 10 years: No Rash/swelled tongue If all of the above answers are NO, then may proceed with Cephalosporin use.     LABORATORY DATA:  I have reviewed the labs as listed.  CBC    Component Value Date/Time   WBC 6.1 12/08/2023 1043   RBC 3.81 (L) 12/08/2023 1043   HGB 12.4 12/08/2023 1043   HGB 12.5 09/26/2023 1053   HCT 35.7 (L) 12/08/2023 1043   HCT 36.6 09/26/2023 1053   PLT 194 12/08/2023  1043   PLT 186 09/26/2023 1053   MCV 93.7 12/08/2023 1043   MCV 96 09/26/2023 1053   MCH 32.5 12/08/2023 1043   MCHC 34.7 12/08/2023 1043   RDW 12.8 12/08/2023 1043   RDW 12.7 09/26/2023 1053   LYMPHSABS 2.6 12/08/2023 1043   LYMPHSABS 2.5 09/26/2023 1053   MONOABS 0.6 12/08/2023 1043   EOSABS 0.1 12/08/2023 1043   EOSABS 0.0 09/26/2023 1053   BASOSABS 0.0 12/08/2023 1043   BASOSABS 0.0 09/26/2023 1053      Latest Ref Rng & Units 10/10/2023   12:34 PM 09/30/2023   11:36 AM 09/28/2023    1:11 PM  CMP  Glucose 70 - 99 mg/dL 83  895  864   BUN 8 - 27 mg/dL 27  21  25    Creatinine 0.57 - 1.00 mg/dL 8.56  8.70  8.57   Sodium 134 - 144 mmol/L 139  138  138   Potassium 3.5 - 5.2 mmol/L 4.0  4.2  3.1   Chloride 96 - 106 mmol/L 97  96  97   CO2 20 - 29 mmol/L 26  23  26    Calcium  8.7 - 10.3 mg/dL 8.8  9.2  8.9   Total Protein 6.0 - 8.5 g/dL 6.5   7.0   Total Bilirubin 0.0 - 1.2 mg/dL 0.4   0.8   Alkaline Phos 49 - 135 IU/L 64   55   AST 0 - 40 IU/L 13   16   ALT 0 - 32 IU/L 9   9     DIAGNOSTIC IMAGING:  I have independently reviewed the relevant imaging and discussed with the patient.   WRAP UP:  All questions were answered. The patient knows to call the clinic with any problems, questions or concerns.  Medical decision making: Moderate  Time spent on visit: I spent 20 minutes counseling the patient face to face. The total time spent in the appointment was 30 minutes and more than 50% was on counseling.  Melissa CHRISTELLA Barefoot, PA-C  12/15/23 12:03 PM

## 2023-12-15 ENCOUNTER — Inpatient Hospital Stay

## 2023-12-15 ENCOUNTER — Inpatient Hospital Stay: Admitting: Physician Assistant

## 2023-12-15 ENCOUNTER — Other Ambulatory Visit: Payer: Self-pay | Admitting: Oncology

## 2023-12-15 ENCOUNTER — Telehealth: Payer: Self-pay | Admitting: Cardiology

## 2023-12-15 VITALS — BP 111/60 | HR 70 | Temp 98.5°F | Resp 17 | Ht 62.5 in | Wt 156.7 lb

## 2023-12-15 DIAGNOSIS — E538 Deficiency of other specified B group vitamins: Secondary | ICD-10-CM

## 2023-12-15 DIAGNOSIS — D500 Iron deficiency anemia secondary to blood loss (chronic): Secondary | ICD-10-CM | POA: Diagnosis not present

## 2023-12-15 DIAGNOSIS — D509 Iron deficiency anemia, unspecified: Secondary | ICD-10-CM

## 2023-12-15 MED ORDER — VITAMIN B-12 1000 MCG PO TABS: 1000.0000 ug | ORAL_TABLET | Freq: Every day | ORAL | 3 refills | Status: AC

## 2023-12-15 MED ORDER — CYANOCOBALAMIN 1000 MCG/ML IJ SOLN
1000.0000 ug | Freq: Once | INTRAMUSCULAR | Status: AC
  Administered 2023-12-15: 1000 ug via INTRAMUSCULAR
  Filled 2023-12-15: qty 1

## 2023-12-15 NOTE — Patient Instructions (Signed)
 [facility] at [facility] **VISIT SUMMARY & IMPORTANT INSTRUCTIONS **   You were seen today by Pleasant Barefoot PA-C for your iron deficiency anemia.   Your blood , iron, and B12 levels are much better! You do not need any IV iron at this time. Instead of Vitamin B12 injections, we will start you on Vitamin B12 pills (1,000 mcg) once daily. Follow up in 1 year, but please reach out sooner if you have any concerns prior to that appointment!   ** Thank you for trusting me with your healthcare!  I strive to provide all of my patients with quality care at each visit.  If you receive a survey for this visit, I would be so grateful to you for taking the time to provide feedback.  Thank you in advance!  ~ Gene Glazebrook                                        Dr. Mickiel Davonna Pleasant Barefoot, PA-C     Delon Hope, NP   - - - - - - - - - - - - - - - - - -     Thank you for choosing [facility] at [facility] to provide your oncology and hematology care.  To afford each patient quality time with our provider, please arrive at least 15 minutes before your scheduled appointment time.   If you have a lab appointment with the Cancer Center please come in thru the Main Entrance and check in at the main information desk.  You need to re-schedule your appointment should you arrive 10 or more minutes late.  We strive to give you quality time with our providers, and arriving late affects you and other patients whose appointments are after yours.  Also, if you no show three or more times for appointments you may be dismissed from the clinic at the providers discretion.     Again, thank you for choosing [facility].  Our hope is that these requests will decrease the amount of time that you wait before being seen by our physicians.       _____________________________________________________________  Should you have questions after your visit to  [facility], please contact our office at [phone] and follow the prompts.  Our office hours are 8:00 a.m. and 4:30 p.m. Monday - Friday.  Please note that voicemails left after 4:00 p.m. may not be returned until the following business day.  We are closed weekends and major holidays.  You do have access to a nurse 24-7, just call the main number to the clinic [phone] and do not press any options, hold on the line and a nurse will answer the phone.    For prescription refill requests, have your pharmacy contact our office and allow 72 hours.

## 2023-12-15 NOTE — Telephone Encounter (Signed)
 Spoke to pt who stated that for the last month or so she has had problems with elevated bp. Pt stated that since having ICD implanted she has felt worse. Pt stated that she has experienced dizziness in the last couple of weeks (worse than normal) and presyncopal episodes when she tries to sit down in a chair. Pt stated that she checks her bp in the morning and her readings are very elevated. Pt stated that she takes her Coreg  at differing times of the day, each day (between 8-9am), twice daily.   Please advise.   Will route to device clinic and gen card provider.

## 2023-12-15 NOTE — Telephone Encounter (Signed)
  Pt c/o BP issue: STAT if pt c/o blurred vision, one-sided weakness or slurred speech.  STAT if BP is GREATER than 180/120 TODAY.  STAT if BP is LESS than 90/60 and SYMPTOMATIC TODAY  1. What is your BP concern? hypertension  2. Have you taken any BP medication today? yes  3. What are your last 5 BP readings?  11/17  163/74 hr 65 11/18  160/75 hr 63 11/19   225/94 hr 62 11/20   182/90 hr 62 11/21   228/112 hr 63 11/22   189/73 hr 64 11/23   187/110 hr 66 11/24   150/100 hr 67   4. Are you having any other symptoms (ex. Dizziness, headache, blurred vision, passed out)?  States she is always dizzy, nauseous and has a headache so she can't say that it is coming from her BP  Patient has to leave for a dr appt at 1:00 pm, should be back after 3 pm

## 2023-12-15 NOTE — Progress Notes (Signed)
B12 injection given per orders. Patient tolerated it well without problems. Vitals stable and discharged home from clinic via wheelchair. Follow up as scheduled.  

## 2023-12-16 LAB — METHYLMALONIC ACID, SERUM: Methylmalonic Acid, Quantitative: 626 nmol/L — ABNORMAL HIGH (ref 0–378)

## 2023-12-16 NOTE — Telephone Encounter (Signed)
 Spoke to patient who verbalized understanding. Patient had no further questions or concerns at this time.   Will route to scheduling team for appt with APP.

## 2023-12-17 ENCOUNTER — Other Ambulatory Visit: Payer: Self-pay | Admitting: Oncology

## 2023-12-22 ENCOUNTER — Encounter: Payer: Self-pay | Admitting: Nurse Practitioner

## 2023-12-22 ENCOUNTER — Ambulatory Visit: Attending: Nurse Practitioner | Admitting: Nurse Practitioner

## 2023-12-22 VITALS — BP 118/62 | HR 74 | Ht 62.5 in | Wt 168.6 lb

## 2023-12-22 DIAGNOSIS — N1832 Chronic kidney disease, stage 3b: Secondary | ICD-10-CM | POA: Insufficient documentation

## 2023-12-22 DIAGNOSIS — Z79899 Other long term (current) drug therapy: Secondary | ICD-10-CM | POA: Insufficient documentation

## 2023-12-22 DIAGNOSIS — I5023 Acute on chronic systolic (congestive) heart failure: Secondary | ICD-10-CM | POA: Insufficient documentation

## 2023-12-22 DIAGNOSIS — I480 Paroxysmal atrial fibrillation: Secondary | ICD-10-CM | POA: Insufficient documentation

## 2023-12-22 DIAGNOSIS — R42 Dizziness and giddiness: Secondary | ICD-10-CM | POA: Diagnosis present

## 2023-12-22 DIAGNOSIS — I9589 Other hypotension: Secondary | ICD-10-CM | POA: Insufficient documentation

## 2023-12-22 DIAGNOSIS — Z95810 Presence of automatic (implantable) cardiac defibrillator: Secondary | ICD-10-CM | POA: Diagnosis present

## 2023-12-22 MED ORDER — CARVEDILOL 6.25 MG PO TABS: 6.2500 mg | ORAL_TABLET | Freq: Two times a day (BID) | ORAL | 3 refills | Status: DC

## 2023-12-22 MED ORDER — TORSEMIDE 20 MG PO TABS: 20.0000 mg | ORAL_TABLET | Freq: Two times a day (BID) | ORAL | 3 refills | Status: AC

## 2023-12-22 MED ORDER — CARVEDILOL 3.125 MG PO TABS: 3.1250 mg | ORAL_TABLET | Freq: Two times a day (BID) | ORAL | 3 refills | Status: AC

## 2023-12-22 NOTE — Progress Notes (Unsigned)
 Cardiology Office Note   Date:  12/22/2023 ID:  KLOHE LOVERING, DOB Sep 29, 1945, MRN [id number] PCP: Lavell Bari LABOR, FNP  [facility] Providers Cardiologist:  Jayson Sierras, MD Electrophysiologist:  Eulas FORBES Furbish, MD     History of Present Illness Melissa Paul is a 78 y.o. female with a PMH of palpitations, chest pain, HFrEF (NICM), PAF, VT/VF, s/p ICD, CKD, at risk for OSA, who presents today for scheduled follow-up. Followed by EP.   LVEF 30% in October 2024.  Previously documented that she did not tolerate Entresto  due to lightheadedness and hypotension.  Last seen by Dr. Sierras on August 06, 2023. Noted occasional episodes of chest discomfort, noted some fluid retention. She self discontinued Eliquis  d/t cost and difficulty receiving prescriptions. Was now taking ASA 81 mg daily, also came off Farxiga  d/t UTI's.  Due to her fluid retention, was instructed to take an additional Lasix  over her baseline 80 mg daily for the next few days.  Was planned to check on assistance for Eliquis .  Appears she underwent generator change on October 2025 for her device.  She called our office at the end of November 2025 noting elevated BP, noticed experiencing dizziness that was worsening, presyncopal episodes.  Dr. Sierras recommended to have her bring in her home BP readings and have orthostatics obtained.   Today she is here for scheduled visit with her daughter (who is also my patient).  Patient admits to orthostatic dizziness, says her BP reading today is high for her. Says ever since she has had her generator change out performed in October 2025, hasn't felt right. Admits to lower BP readings. Weight is up over 10 lbs from 1 week ago. Pt says she can feel extra weight on her and admits to chest discomfort related to this. Admits to poor urine output and admits to being complaint with her medications but says she hasn't noticed fluid coming off. Does admit to orthopnea. Denies  any palpitations, syncope, presyncope, PND, acute bleeding, or claudication.  ROS: Negative. See HPI.   SH: Daughter is also a patient of mine.   Studies Reviewed  EKG: EKG is not ordered today.   Echo 10/2022:  1. Left ventricular ejection fraction, by estimation, is 30%. The left  ventricle has moderately decreased function. The left ventricle  demonstrates global hypokinesis. The left ventricular internal cavity size  was moderately dilated. Left ventricular  diastolic parameters are consistent with Grade II diastolic dysfunction  (pseudonormalization). Elevated left atrial pressure.   2. Right ventricular systolic function is normal. The right ventricular  size is normal. Tricuspid regurgitation signal is inadequate for assessing  PA pressure.   3. Left atrial size was mildly dilated.   4. The mitral valve is abnormal. Mild mitral valve regurgitation. No  evidence of mitral stenosis.   5. The aortic valve is tricuspid. Aortic valve regurgitation is not  visualized. No aortic stenosis is present.   6. The inferior vena cava is normal in size with greater than 50%  respiratory variability, suggesting right atrial pressure of 3 mmHg.   Comparison(s): EF 25-30%. Mildly dilated left ventricle. Moderate mitral  regurgitation.   Right heart cath 10/2020:  1. Low filling pressures.  2. Preserved cardiac output.    Right/left heart cath 03/2019:  1. Normal filling pressures.  2. Preserved cardiac output.  3. No significant coronary disease noted.    Exertional symptoms not explained by this study.   Physical Exam VS:  BP 118/62 (BP Location:  Right Arm, Cuff Size: Normal)   Pulse 74   Ht 5' 2.5 (1.588 m)   Wt 168 lb 9.6 oz (76.5 kg)   SpO2 98%   BMI 30.35 kg/m   Orthostatic VS for the past 24 hrs (Last 3 readings):  BP- Lying Pulse- Lying BP- Sitting Pulse- Sitting BP- Standing at 0 minutes Pulse- Standing at 0 minutes BP- Standing at 3 minutes Pulse- Standing at 3  minutes  12/22/23 1057 97/60 70 (!) 83/52 71 90/56 75 92/57 71      Wt Readings from Last 3 Encounters:  12/22/23 168 lb 9.6 oz (76.5 kg)  12/15/23 156 lb 11.2 oz (71.1 kg)  10/27/23 156 lb (70.8 kg)    GEN: Well nourished, well developed in no acute distress NECK: No JVD; No carotid bruits CARDIAC: S1/S2, RRR, no murmurs, rubs, gallops RESPIRATORY:  Clear to auscultation without rales, wheezing or rhonchi  ABDOMEN: Soft, non-tender, non-distended EXTREMITIES:  Nonpitting edema localized to ankles and BLE; No deformity   ASSESSMENT AND PLAN  Acute on chronic HFrEF, medication management Stage C, NYHA class II-III symptoms. NICM. Hx of ICD. EF 30% 10/2022. Appears volume overloaded, weight up over 10 lbs since last week and taking Lasix  daily. Will stop Lasix  and switch to Torsemide  20 mg BID and repeat BMET within 1 week. Will reduce Coreg  to 3.125 mg BID d/t her lower BP readings and symptoms - see below. She will call on Friday and update us  regarding her weights. No other medication changes at this time. Low sodium diet, fluid restriction <2L, and daily weights encouraged. Educated to contact our office for weight gain of 2 lbs overnight or 5 lbs in one week.   Hypotension, dizziness Noted today while obtaining orthostatics. Will reduce Coreg  to 3.125 mg BID and she will update us  by this Friday regarding her BP readings. If symptoms are not improved, plan to d/c medication.  No other medication changes at this time.  Encouraged her to stay well hydrated. Care and ED precautions discussed. Discussed to monitor BP at home at least 2 hours after medications and sitting for 5-10 minutes.   PAF, VF/VT, s/p ICD Denies any tachycardia or palpitations.  Heart rate is well-controlled today.  Reducing Coreg  as noted above.  Last in-clinic device check showed stable parameters along thresholds, sensing, and impedance, there were no treated arrhythmias.  Continue follow-up with EP.  CKD stage  3b Most recent labs on file shows stable kidney disease.  Avoid nephrotoxic agents.  Obtaining BMET as noted above.  Continue follow-up with nephrology.      Dispo: Follow-up with MD/APP in 4 to 6 weeks or sooner any changes.  Signed, Melissa Crate, NP

## 2023-12-22 NOTE — Patient Instructions (Addendum)
 Medication Instructions:  Decrease Coreg  to 3.125 mg Two Times Daily  Start Torsemide  20 mg Two Times Daily   Stop Taking Lasix    Call on Friday with blood pressures and weights.  *If you need a refill on your cardiac medications before your next appointment, please call your pharmacy*  Lab Work: Your physician recommends that you return for lab work in: Friday   If you have labs (blood work) drawn today and your tests are completely normal, you will receive your results only by: MyChart Message (if you have MyChart) OR A paper copy in the mail If you have any lab test that is abnormal or we need to change your treatment, we will call you to review the results.  Testing/Procedures: NONE   Follow-Up: At [facility], you and your health needs are our priority.  As part of our continuing mission to provide you with exceptional heart care, our providers are all part of one team.  This team includes your primary Cardiologist (physician) and Advanced Practice Providers or APPs (Physician Assistants and Nurse Practitioners) who all work together to provide you with the care you need, when you need it.  Your next appointment:   4 -6 week(s)  Provider:   You may see Jayson Sierras, MD or the following Advanced Practice Provider on your designated Care Team:   Almarie Crate, NP    We recommend signing up for the patient portal called MyChart.  Sign up information is provided on this After Visit Summary.  MyChart is used to connect with patients for Virtual Visits (Telemedicine).  Patients are able to view lab/test results, encounter notes, upcoming appointments, etc.  Non-urgent messages can be sent to your provider as well.   To learn more about what you can do with MyChart, go to [email or web address].   Other Instructions Thank you for choosing [facility]!

## 2023-12-26 LAB — BASIC METABOLIC PANEL WITH GFR
BUN/Creatinine Ratio: 12 (ref 12–28)
BUN: 17 mg/dL (ref 8–27)
CO2: 26 mmol/L (ref 20–29)
Calcium: 9 mg/dL (ref 8.7–10.3)
Chloride: 98 mmol/L (ref 96–106)
Creatinine, Ser: 1.44 mg/dL — ABNORMAL HIGH (ref 0.57–1.00)
Glucose: 115 mg/dL — ABNORMAL HIGH (ref 70–99)
Potassium: 4.3 mmol/L (ref 3.5–5.2)
Sodium: 139 mmol/L (ref 134–144)
eGFR: 37 mL/min/1.73 — ABNORMAL LOW (ref >59)

## 2023-12-29 ENCOUNTER — Ambulatory Visit: Payer: Self-pay | Admitting: Nurse Practitioner

## 2023-12-30 ENCOUNTER — Other Ambulatory Visit: Payer: Self-pay

## 2023-12-30 DIAGNOSIS — R921 Mammographic calcification found on diagnostic imaging of breast: Secondary | ICD-10-CM

## 2023-12-30 DIAGNOSIS — R928 Other abnormal and inconclusive findings on diagnostic imaging of breast: Secondary | ICD-10-CM

## 2023-12-30 NOTE — Progress Notes (Signed)
 Order placed for 1 year follow up diagnostic mammogram

## 2024-01-12 ENCOUNTER — Ambulatory Visit: Payer: Medicare Other

## 2024-01-12 DIAGNOSIS — I480 Paroxysmal atrial fibrillation: Secondary | ICD-10-CM | POA: Diagnosis not present

## 2024-01-13 ENCOUNTER — Ambulatory Visit

## 2024-01-13 DIAGNOSIS — Z95810 Presence of automatic (implantable) cardiac defibrillator: Secondary | ICD-10-CM | POA: Diagnosis not present

## 2024-01-13 DIAGNOSIS — I5022 Chronic systolic (congestive) heart failure: Secondary | ICD-10-CM

## 2024-01-13 LAB — CUP PACEART REMOTE DEVICE CHECK
Battery Remaining Longevity: 85 mo
Battery Remaining Percentage: 95.5 %
Battery Voltage: 3.04 V
Brady Statistic AP VP Percent: 79 %
Brady Statistic AP VS Percent: 3.2 %
Brady Statistic AS VP Percent: 9.7 %
Brady Statistic AS VS Percent: 5.5 %
Brady Statistic RA Percent Paced: 81 %
Date Time Interrogation Session: 20251222020350
HighPow Impedance: 62 Ohm
Implantable Lead Connection Status: 753985
Implantable Lead Connection Status: 753985
Implantable Lead Connection Status: 753985
Implantable Lead Implant Date: 20110912
Implantable Lead Implant Date: 20110912
Implantable Lead Implant Date: 20120710
Implantable Lead Location: 753858
Implantable Lead Location: 753859
Implantable Lead Location: 753860
Implantable Lead Model: 7122
Implantable Pulse Generator Implant Date: 20251006
Lead Channel Impedance Value: 300 Ohm
Lead Channel Impedance Value: 440 Ohm
Lead Channel Impedance Value: 540 Ohm
Lead Channel Pacing Threshold Amplitude: 0.75 V
Lead Channel Pacing Threshold Amplitude: 0.75 V
Lead Channel Pacing Threshold Amplitude: 0.75 V
Lead Channel Pacing Threshold Pulse Width: 0.5 ms
Lead Channel Pacing Threshold Pulse Width: 0.5 ms
Lead Channel Pacing Threshold Pulse Width: 0.5 ms
Lead Channel Sensing Intrinsic Amplitude: 11.1 mV
Lead Channel Sensing Intrinsic Amplitude: 2.2 mV
Lead Channel Setting Pacing Amplitude: 1.25 V
Lead Channel Setting Pacing Amplitude: 2 V
Lead Channel Setting Pacing Amplitude: 2 V
Lead Channel Setting Pacing Pulse Width: 0.5 ms
Lead Channel Setting Pacing Pulse Width: 0.5 ms
Lead Channel Setting Sensing Sensitivity: 0.5 mV
Pulse Gen Serial Number: 211057461
Zone Setting Status: 755011

## 2024-01-13 NOTE — Progress Notes (Signed)
 EPIC Encounter for ICM Monitoring  Patient Name: Melissa Paul is a 78 y.o. female Date: 01/13/2024 Primary Care Physican: Lavell Bari LABOR, FNP Primary Cardiologist: [place] Electrophysiologist: Mealor Bi-V Pacing: 89%        01/01/2022 Weight: 178 lbs 01/27/2023 Weight: 163 lbs   02/03/2023 Weight: 165 lbs 03/05/2023 Weight: 163 lbs (up 4 lbs) 06/25/2023 Weight: 152-155 (was 187 lbs) 09/23/2023 Weight:  150-155 lbs   AT/AF Burden: 3.8%            Transmission results reviewed.     Since 10/27/2023 battery replacement: CorVue Thoracic impedance suggesting possible fluid accumulation from 12/19/2023-12/26/2023 and possible dryness starting 01/04/2024 and returning close to baseline 01/13/2024.   Prescribed:  Furosemide  80 mg Take 1 tablet (80 mg total) by mouth daily as needed.  Pt takes extra if needed.   06/25/22 She is taking about twice a week due to her body can't handle Furosemide  daily.   Spirolactone 25 mg take 1 tablet by mouth daily   Labs: 10/10/2023 Creatinine 1.43, BUN 27, Potassium 4.0, Sodium 139, GFR 37  09/30/2023 Creatinine 1.29, BUN 21, Potassium 4.2, Sodium 138, GFR 42  09/28/2023 Creatinine 1.42, BUN 25, Potassium 3.1, Sodium 138  A complete set of results can be found in Results Review.   Recommendations:   No changes.   Follow-up plan: ICM clinic phone appointment on 02/16/2024.   91 day device clinic remote transmission 04/12/2024.    EP/Cardiology Office Visits:  02/13/2024 with Dr Nancey.  02/10/2024 with Dr Debera.      Copy of ICM check sent to Dr. Nancey.    Remote monitoring is medically necessary for Heart Failure Management.    Daily Thoracic Impedance ICM trend: 10/27/2023 through 01/13/2024.    12-14 Month Thoracic Impedance ICM trend:     [name], RN 01/13/2024 4:03 PM

## 2024-01-14 NOTE — Progress Notes (Signed)
 Remote ICD Transmission

## 2024-01-16 ENCOUNTER — Ambulatory Visit: Payer: Self-pay | Admitting: *Deleted

## 2024-01-16 NOTE — Telephone Encounter (Signed)
Pt has appt 12/29

## 2024-01-16 NOTE — Telephone Encounter (Signed)
 FYI Only or Action Required?: FYI only for provider: ED advised.  Patient was last seen in primary care on 10/10/2023 by Lavell Bari LABOR, FNP.  Called Nurse Triage reporting Pain.  Symptoms began today.  Interventions attempted: Prescription medications: doxycyline, elevated foot , soaked in epsom salt.  Symptoms are: gradually worsening.  Triage Disposition: Go to ED Now (or PCP Triage)  Patient/caregiver understands and will follow disposition?: No, wishes to speak with PCP   Recommended ED and patient does not want to go . Reports unable to walk only bear weight on left heel. Please advise. Patient requesting call back if appt can be scheduled for Monday .            Copied from CRM #[id number]. Topic: Clinical - Red Word Triage >> Jan 16, 2024 11:48 AM Mia F wrote: Red Word that prompted transfer to Nurse Triage: Left foot pain and gets very swollen. It is swollen and burning right now. Shes says she has cellulitist in the foot. She says she cannot stand or walk on it. She was seen in the hospital on 12/20 and released the same day. The medication helps a very little. She says  this is a new issue for her. She says it started Thursday and Friday it worsened. Reason for Disposition  Patient sounds very sick or weak to the triager  Answer Assessment - Initial Assessment Questions Recommended ED. Patient reports she does not want to go back to ED. Difficulty walking . Patient requesting appt Monday. No transportation today. Please advise.        1. ONSET: When did the pain start?      01/10/24 2. LOCATION: Where is the pain located?      Left foot  3. PAIN: How bad is the pain?    (Scale 1-10; or mild, moderate, severe)     8/10  4. WORK OR EXERCISE: Has there been any recent work or exercise that involved this part of the body?      Walking causes worsening pain has to walk on heel  5. CAUSE: What do you think is causing the leg pain?     Cellulitis  6.  OTHER SYMPTOMS: Do you have any other symptoms? (e.g., chest pain, back pain, breathing difficulty, swelling, rash, fever, numbness, weakness)     Left foot pain , and swelling redness. Reports unable to walk only able to bear weight on left heel. Taking medication since weekend with no improvement 7. PREGNANCY: Is there any chance you are pregnant? When was your last menstrual period?     na  Protocols used: Leg Pain-A-AH

## 2024-01-19 ENCOUNTER — Encounter: Payer: Self-pay | Admitting: Family Medicine

## 2024-01-19 ENCOUNTER — Ambulatory Visit (INDEPENDENT_AMBULATORY_CARE_PROVIDER_SITE_OTHER): Admitting: Family Medicine

## 2024-01-19 VITALS — BP 117/64 | HR 76 | Temp 98.6°F | Ht 62.5 in | Wt 160.8 lb

## 2024-01-19 DIAGNOSIS — L03119 Cellulitis of unspecified part of limb: Secondary | ICD-10-CM

## 2024-01-19 MED ORDER — DOXYCYCLINE HYCLATE 100 MG PO TABS: 100.0000 mg | ORAL_TABLET | Freq: Two times a day (BID) | ORAL | 0 refills | Status: AC

## 2024-01-19 NOTE — Progress Notes (Signed)
 "  Established Patient Office Visit  Subjective   Patient ID: Melissa Paul, female    DOB: 10-Mar-1945  Age: 78 y.o. MRN: [id number]  Chief Complaint  Patient presents with   Cellulitis    HPI  History of Present Illness   Melissa Paul is an 77 year old female who presents with persistent pain and swelling in her left foot following a diagnosis of cellulitis.  She was seen in the emergency room at [facility] on January 10, 2024, for pain and swelling in her left foot and was diagnosed with cellulitis. She was prescribed doxycycline twice a day for seven days and instructed to perform Epsom salt soaks three times a day.  She completed the course of doxycycline two days ago. Since then, her foot feels 'really hot' and 'burns really bad.' The pain has improved but is still present, described as a burning sensation. She was unable to sleep last night due to the discomfort, stating 'it's on fire.' The area feels tender to touch, particularly on one side, and is described as sore.  No fever is present. She finds it difficult to keep her foot elevated. She experiences increased discomfort when her foot is down. She mentions having to walk on her heel due to tenderness and sensitivity in her toes.  She continues to perform Epsom salt soaks as previously instructed. Denies fever or chills.        ROS    Objective:     BP 117/64   Pulse 76   Temp 98.6 F (37 C) (Temporal)   Ht 5' 2.5 (1.588 m)   Wt 160 lb 12.8 oz (72.9 kg)   SpO2 98%   BMI 28.94 kg/m    Physical Exam Vitals and nursing note reviewed.  Constitutional:      General: She is not in acute distress.    Appearance: She is not ill-appearing, toxic-appearing or diaphoretic.  Pulmonary:     Effort: Pulmonary effort is normal. No respiratory distress.  Musculoskeletal:     Left lower leg: Edema (generalized swelling to LLE with warmth. Eyrthema and tenderness to left lateral dorsal foot. Pictured below) present.   Skin:    General: Skin is warm and dry.  Neurological:     Mental Status: She is alert and oriented to person, place, and time.     Gait: Gait abnormal (antalgic gait, using cane).      No results found for any visits on 01/19/24.    The ASCVD Risk score (Arnett DK, et al., 2019) failed to calculate for the following reasons:   The 2019 ASCVD risk score is only valid for ages 37 to 24   * - Cholesterol units were assumed    Assessment & Plan:   Melissa Paul was seen today for cellulitis.  Diagnoses and all orders for this visit:  Cellulitis of foot -     doxycycline (VIBRA-TABS) 100 MG tablet; Take 1 tablet (100 mg total) by mouth 2 (two) times daily for 7 days. 1 po bid  Assessment and Plan    Cellulitis of left foot Cellulitis partially improved with initial doxycycline course. Persistent burning, warmth, and swelling noted. No fever. - Prescribed doxycycline twice daily for 7 days. - Continue Epsom salt soaks three times daily. - Advised foot elevation. - Instructed to return if symptoms worsen or fever develops.      Return to office for new or worsening symptoms, or if symptoms persist.   The patient indicates understanding of  these issues and agrees with the plan.   Melissa CHRISTELLA Search, FNP "

## 2024-01-21 ENCOUNTER — Ambulatory Visit: Payer: Self-pay | Admitting: Cardiovascular Disease

## 2024-01-29 ENCOUNTER — Encounter: Admitting: Cardiovascular Disease

## 2024-02-10 ENCOUNTER — Ambulatory Visit: Attending: Cardiology | Admitting: Cardiology

## 2024-02-10 ENCOUNTER — Ambulatory Visit: Admitting: Cardiology

## 2024-02-10 ENCOUNTER — Encounter: Payer: Self-pay | Admitting: Cardiology

## 2024-02-10 ENCOUNTER — Telehealth: Payer: Self-pay | Admitting: Cardiovascular Disease

## 2024-02-10 VITALS — BP 108/62 | HR 78 | Ht 62.5 in | Wt 161.0 lb

## 2024-02-10 DIAGNOSIS — I5023 Acute on chronic systolic (congestive) heart failure: Secondary | ICD-10-CM | POA: Diagnosis present

## 2024-02-10 DIAGNOSIS — N1832 Chronic kidney disease, stage 3b: Secondary | ICD-10-CM | POA: Diagnosis not present

## 2024-02-10 DIAGNOSIS — I480 Paroxysmal atrial fibrillation: Secondary | ICD-10-CM | POA: Insufficient documentation

## 2024-02-10 DIAGNOSIS — Z79899 Other long term (current) drug therapy: Secondary | ICD-10-CM | POA: Insufficient documentation

## 2024-02-10 DIAGNOSIS — I5022 Chronic systolic (congestive) heart failure: Secondary | ICD-10-CM | POA: Diagnosis present

## 2024-02-10 DIAGNOSIS — Z95810 Presence of automatic (implantable) cardiac defibrillator: Secondary | ICD-10-CM | POA: Diagnosis not present

## 2024-02-10 DIAGNOSIS — I5020 Unspecified systolic (congestive) heart failure: Secondary | ICD-10-CM | POA: Insufficient documentation

## 2024-02-10 MED ORDER — SPIRONOLACTONE 25 MG PO TABS: 12.5000 mg | ORAL_TABLET | Freq: Every day | ORAL | 1 refills | Status: AC

## 2024-02-10 NOTE — Patient Instructions (Addendum)
 Medication Instructions:  Your physician has recommended you make the following change in your medication:  Start spironolactone  12.5 mg daily Continue all other medications as prescribed  Labwork: BMET (Due in April 2026 just before your next visit) Non-fasting [facility] ([address] [place])  Testing/Procedures: Your physician has requested that you have an echocardiogram in 3 months just before your next visit. Echocardiography is a painless test that uses sound waves to create images of your heart. It provides your doctor with information about the size and shape of your heart and how well your hearts chambers and valves are working. This procedure takes approximately one hour. There are no restrictions for this procedure. Please do NOT wear cologne, perfume, aftershave, or lotions (deodorant is allowed). Please arrive 15 minutes prior to your appointment time.  Please note: We ask at that you not bring children with you during ultrasound (echo/ vascular) testing. Due to room size and safety concerns, children are not allowed in the ultrasound rooms during exams. Our front office staff cannot provide observation of children in our lobby area while testing is being conducted. An adult accompanying a patient to their appointment will only be allowed in the ultrasound room at the discretion of the ultrasound technician under special circumstances. We apologize for any inconvenience.  Follow-Up: Your physician recommends that you schedule a follow-up appointment in: 3 months0  Any Other Special Instructions Will Be Listed Below (If Applicable).  If you need a refill on your cardiac medications before your next appointment, please call your pharmacy.

## 2024-02-10 NOTE — Telephone Encounter (Signed)
 Pt is scheduled to see Dr Debera today at 02/10/2024

## 2024-02-10 NOTE — Telephone Encounter (Signed)
 Outreach made to Pt.  Advised discussing need for programming change with Dr. Nancey.   Advised will call her back after he reviews.  If Dr. Nancey in agreement with device change, will schedule Pt this Friday in [place].  Pt available for appt.

## 2024-02-10 NOTE — Telephone Encounter (Signed)
 Call made to patient to reschedule patients 90 day post ICD gen change appointment from 1/23 to 3/6 due to Dr. Dwyane schedule change for 1/23. While talking with the patient, she starts to talk about her chest pain, feet and leg swelling and SOB that she has with the swelling. She states she feels the worst she has felt since diagnosed with CHF  Pt c/o of Chest Pain: STAT if active (IN THIS MOMENT) CP, including tightness, pressure, jaw pain, shoulder/upper arm/back pain, SOB, nausea, and vomiting.  1. Are you having CP right now (tightness, pressure, or discomfort)? Not right now.  2. Are you experiencing any other symptoms (ex. SOB, nausea, vomiting, sweating)? SOB, stays sick to her stomach constantly   3. How long have you been experiencing CP? Since her device was replaced in October.  4. Is your CP continuous or coming and going? Happens with anything strenuous, comes and goes   5. Have you taken Nitroglycerin ? No  6. If CP returns before callback, please consider calling 911. ?   She states she didn't have these symptoms with her device before the gen change, so therefore she states that she doesn't think the device is put in right.   Pt c/o swelling: STAT is pt has developed SOB within 24 hours  How much weight have you gained and in what time span? States she can go from her normal weight of 150 to 160 in a day  If swelling, where is the swelling located? Feet  and legs   Are you currently taking a fluid pill? torsemide  (DEMADEX ) 20 MG tablet   Are you currently SOB? Not at this moment, but anytime she starts walking, she will get real short of breath  Do you have a log of your daily weights (if so, list)?   Have you gained 3 pounds in a day or 5 pounds in a week?   Have you traveled recently? No   Pt c/o Shortness Of Breath: STAT if SOB developed within the last 24 hours or pt is noticeably SOB on the phone  1. Are you currently SOB (can you hear that pt is SOB on  the phone)?  Not at this moment, but anytime she starts walking, she will get real short of breath  2. How long have you been experiencing SOB? Since her ICD gen change in Oct  3. Are you SOB when sitting or when up moving around? When up moving around   4. Are you currently experiencing any other symptoms? No

## 2024-02-10 NOTE — Telephone Encounter (Signed)
 Patient states she is very upset that she has not been seen since her device was implanted. Scheduling contacted her to reschedule an appointment, and during that call the patient reported chest pain, prompting transfer to triage as a stat call. Patient denies chest pain. She reports that her device has felt off since implantation and is concerned it has not been checked since placement. Describes intermittent pulling sensations and occasional sudden sharp pain that resolves spontaneously, present since October when the device was placed. Patient reports increased bilateral lower extremity swelling compared to baseline. States she has not missed any doses of her diuretic and denies shortness of breath. Informed patient that EP nursing and the device team will be contacted for further guidance. Patient has an appointment with gen card this afternoon. Denies any open areas, redness, or drainage at the device site. Patient was advised on emergency department precautions and verbalizes understanding.

## 2024-02-10 NOTE — Telephone Encounter (Signed)
 Transmission received.  Presenting rhythm AP/BP 77 with PVC's. At last in office check Melissa Paul underlying rhythm was AS/VS 48.  Melissa Paul atrial pacing 77% since gen change.  Prior to gen change atrial pacing 89%.  Melissa Paul has history of frequent atrial lead noise.  Prior to gen change Melissa Paul sensitivity was programmed 1.0 mV.  At gen change sensitivity was programmed to auto.  It appears device is labeling atrial lead noise as AT/AF and Melissa Paul is inappropriately mode switching and also withholding atrial pacing.  This may be causing Melissa Paul's symptoms.  Will discuss with Dr. Nancey.   Fluid index is normal at this time.

## 2024-02-10 NOTE — Progress Notes (Signed)
 "    Cardiology Office Note  Date: 02/10/2024   ID: Melissa Paul, DOB [birth year], MRN [id number]  History of Present Illness: Melissa Paul is an 79 y.o. female last seen in December by Ms. Miriam NP, I reviewed her note.  Our last visit was in July 2025.  At her last encounter in December 2025 Lasix  was switched to Demadex  and her Coreg  dose was also reduced.  She complains of having fluctuating weight with fluid retention since undergoing generator change in October 2025.  She reports sometimes she can gain as much as 12 pounds in a day.  Difficult to assess objectively as her weights look to be stable, she was 160 pounds in July 2025, did have a weight of 168 pounds in early December 2025, but has been at baseline since despite continued feelings of fluid retention.  We went over her medications.  She is currently on Coreg  3.125 mg twice daily, Demadex  20 mg twice daily, states that Aldactone  was discontinued by PCP given concerns about her blood pressure.  I reviewed her recent lab work, renal function relatively stable with creatinine 1.44 and GFR 37.  St. Jude biventricular ICD in place was followed by Dr. Nancey.  She send a device interrogation today, I reviewed the chart, there are concerns about atrial lead noise being mislabeled as AT/AF resulting in inappropriate mode switching.  She is to follow-up on Friday for device adjustments.  Physical Exam: VS:  BP 108/62 (BP Location: Right Arm)   Pulse 78   Ht 5' 2.5 (1.588 m)   Wt 161 lb (73 kg)   SpO2 97%   BMI 28.98 kg/m , BMI Body mass index is 28.98 kg/m.  Wt Readings from Last 3 Encounters:  02/10/24 161 lb (73 kg)  01/19/24 160 lb 12.8 oz (72.9 kg)  12/22/23 168 lb 9.6 oz (76.5 kg)    General: Patient appears comfortable at rest. HEENT: Conjunctiva and lids normal. Neck: Supple, no elevated JVP or carotid bruits. Lungs: Decreased breath sounds without crackles. Cardiac: Regular rate and rhythm, no S3 or significant  systolic murmur. Extremities: Mild ankle edema, nonpitting in the legs..  ECG:  An ECG dated 09/28/2023 was personally reviewed today and demonstrated:  Dual-chamber pacing.  Labwork: 09/26/2023: TSH 3.390 10/10/2023: ALT 9; AST 13 12/08/2023: Hemoglobin 12.4; Platelets 194 12/25/2023: BUN 17; Creatinine, Ser 1.44; Potassium 4.3; Sodium 139   Other Studies Reviewed Today:  No interval cardiac testing for review today.  Assessment and Plan:  1.  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  She reports fluctuating weights and fluid retention, her recent weight looks to be relatively stable around 160 pounds however (same weight in July 2025).  GDMT has been limited (did not tolerate Entresto  previously due to lightheadedness and hypotension, came off Farxiga  due to UTIs).  Plan to resume Aldactone  at 12.5 mg daily, continue Coreg  3.125 mg daily for now and Demadex  20 mg twice daily (can take an additional a.m. dose for weight gain of 2 to 3 pounds in 24 hours).  Follow-up scheduled with echocardiogram and BMET.  May need to consider Verquvo  if objective weight gain noted and unable to further titrate diuretics.  Could also consider midodrine if blood pressure support necessary.   2.  St. Jude biventricular ICD in place with follow-up with Dr. Nancey.  No device shocks or syncope.  Device adjustments planned by EP as per above discussion.   3.  At risk  for OSA, she has declined sleep testing.   4.  Paroxysmal atrial fibrillation with CHA2DS2-VASc score of 6.  No definite palpitations.  She continues on Eliquis  5 mg twice daily for stroke prophylaxis.  Check BMET for next visit.  5.  CKD stage IIIb, creatinine 1.44 with GFR 37 in December 2025.  Disposition:  Follow up 3 months.  Signed, Jayson JUDITHANN Sierras, M.D., F.A.C.C. [facility]

## 2024-02-10 NOTE — Telephone Encounter (Signed)
 Spoke with pt tried to send a transmission but she needs help. Pt will call back when her son gets there so that he can help her send a transmission

## 2024-02-10 NOTE — Telephone Encounter (Signed)
 Per Dr. Everet atrial sensitivity to 1.0 mV as prior to gen change.  Pt scheduled in [place] this Friday at 10:40 am for program change.  Pt will be notified of appointment by [facility] staff (Pt currently at office visit with Dr. Debera).

## 2024-02-13 ENCOUNTER — Ambulatory Visit: Admitting: Cardiovascular Disease

## 2024-02-13 ENCOUNTER — Ambulatory Visit: Attending: Cardiology

## 2024-02-13 DIAGNOSIS — I428 Other cardiomyopathies: Secondary | ICD-10-CM

## 2024-02-13 DIAGNOSIS — R42 Dizziness and giddiness: Secondary | ICD-10-CM

## 2024-02-13 DIAGNOSIS — I5022 Chronic systolic (congestive) heart failure: Secondary | ICD-10-CM

## 2024-02-13 NOTE — Progress Notes (Addendum)
 Patient brought in acutely today due to feeling continued chest pressure and weakness which wasn't there before gen change in October.  Recent remote transmission revealed oversensing of RA noise and subsequent inhibition of pacing.  Here today for troubleshooting and reprogramming of RA lead per Dr. Marko orders to hard program RA at 1.90mv and turn off auto sense.    (RA lead noise is not new.  This was how device was programmed prior to gen change and auto sense turned on at [facility] in October has resulted in complications).   Device interrogated.  Presenting shows significant oversensing of noise with normal patient movement: sitting in chair, taking off jacket, bending over to pick cane up off of the floor.  RA, RV, LV lead tests performed.  Impedance, threshold and sensing all stable and appropriate with exception of RA oversensing and LV cap confirm failure. (See programming changes below).    PROGRAMMING CHANGES: 1.  RA sensitivity hard programmed at 1.20mv.  This covered up all noise with improved AP and AV synchrony.  Patient reported immediately feeling better.  Reports feeling  a weight lifting off of chest.   2.  Atrial Tachy detection rate increased to 225bpm to further assist with minimizing symptoms, per [employer] recommendations.  3.  LV cap confirm programmed off.  Manual LV threshold test showed appropriate LOC at .75v@.5ms and hard programmed 2.0v@.43ms.   Note was placed/highlighted within device to not turn on RA autosense.    Of Note:  Frequent PVC's noted on presenting today.  PVC burden overall is low at 2.4%.  She is on Carvedilol ; however, it was recently decreased due to hypotension in December by general cardiologist.  Discussed with patient and explained increase in PVC's can also give patient symptoms of fatigue and weakness.  If changes today do not help her symptoms, will need to re-evaluate PVC burden with provider

## 2024-02-16 ENCOUNTER — Telehealth: Payer: Self-pay

## 2024-02-16 ENCOUNTER — Ambulatory Visit: Attending: Cardiovascular Disease

## 2024-02-16 DIAGNOSIS — I5022 Chronic systolic (congestive) heart failure: Secondary | ICD-10-CM | POA: Diagnosis not present

## 2024-02-16 DIAGNOSIS — Z95810 Presence of automatic (implantable) cardiac defibrillator: Secondary | ICD-10-CM

## 2024-02-16 NOTE — Progress Notes (Signed)
 EPIC Encounter for ICM Monitoring  Patient Name: Melissa Paul is a 79 y.o. female Date: 02/16/2024 Primary Care Physican: Lavell Bari LABOR, FNP Primary Cardiologist: [place] Electrophysiologist: Mealor Bi-V Pacing: 81%        01/01/2022 Weight: 178 lbs 01/27/2023 Weight: 163 lbs   02/03/2023 Weight: 165 lbs 03/05/2023 Weight: 163 lbs (up 4 lbs) 06/25/2023 Weight: 152-155 (was 187 lbs) 09/23/2023 Weight:  150-155 lbs 02/10/2024 Office Weight: 161 lbs   AT/AF Burden: 0%            Attempted call to patient and unable to reach.   Transmission results reviewed.     Since 01/13/2024 ICM Remote Transmission:  [facility]Vue Thoracic impedance suggesting possible fluid accumulation starting 02/11/2024.   Prescribed:  Torsemide  20 mg Take 1 tablet (20 mg total) by mouth twice a day.  Per 02/10/2024 Office note - Can take an additional AM dose for weight gain of 2-3 pounds in 24 hours. Spironolactone  25 mg take 0.5 tablet (12.5 mg total) by mouth daily   Labs: 12/24/2024 Creatinine 1.44, BUN 17, Potassium 4.3, Sodium 139, GFR 37 10/10/2023 Creatinine 1.43, BUN 27, Potassium 4.0, Sodium 139, GFR 37  09/30/2023 Creatinine 1.29, BUN 21, Potassium 4.2, Sodium 138, GFR 42  09/28/2023 Creatinine 1.42, BUN 25, Potassium 3.1, Sodium 138  A complete set of results can be found in Results Review.   Recommendations:   Unable to reach.   Will send to Dr Rolan for review if patient is reached.    Follow-up plan: ICM clinic phone appointment on 02/24/2024 to recheck fluid levels.   Next 31 day ICM remote transmission due 03/18/2024.  91 day device clinic remote transmission 04/12/2024.    EP/Cardiology Office Visits:  02/13/2024 with Dr Nancey.  02/10/2024 with Dr Debera.      Copy of ICM check sent to Dr. Nancey.    Remote monitoring is medically necessary for Heart Failure Management.    Daily Thoracic Impedance ICM trend: 11/18/2023 through 02/16/2024.    12-14 Month Thoracic Impedance ICM trend:      Melissa GORMAN Garner, RN 02/16/2024 7:24 AM

## 2024-02-16 NOTE — Telephone Encounter (Signed)
 Remote ICM transmission received.  Attempted call to patient regarding ICM remote transmission and no answer.

## 2024-02-18 NOTE — Progress Notes (Signed)
 02/24/2024 ICM Remote transmission canceled due ICM clinic 31 day follow up currently on hold but 91 day remote monitoring will continue.

## 2024-02-19 NOTE — Progress Notes (Signed)
 31 day ICM Remote transmission canceled due to [facility] clinic is on hold until further notice.  91 day remote monitoring will continue per protocol.

## 2024-02-24 ENCOUNTER — Ambulatory Visit

## 2024-03-09 ENCOUNTER — Ambulatory Visit: Admitting: Urology

## 2024-03-18 ENCOUNTER — Ambulatory Visit

## 2024-03-26 ENCOUNTER — Ambulatory Visit: Admitting: Cardiovascular Disease

## 2024-05-10 ENCOUNTER — Ambulatory Visit

## 2024-05-24 ENCOUNTER — Ambulatory Visit: Admitting: Cardiology

## 2024-09-14 ENCOUNTER — Ambulatory Visit: Payer: Self-pay

## 2024-12-14 ENCOUNTER — Inpatient Hospital Stay

## 2024-12-21 ENCOUNTER — Inpatient Hospital Stay: Admitting: Physician Assistant
